# Patient Record
Sex: Male | Born: 1940
Health system: Southern US, Community
[De-identification: ages and names within clinical notes are randomized; demographics above are authoritative.]

## PROBLEM LIST (undated history)

## (undated) DIAGNOSIS — N390 Urinary tract infection, site not specified: Secondary | ICD-10-CM

## (undated) DIAGNOSIS — Z9289 Personal history of other medical treatment: Secondary | ICD-10-CM

## (undated) DIAGNOSIS — I5032 Chronic diastolic (congestive) heart failure: Secondary | ICD-10-CM

## (undated) DIAGNOSIS — E669 Obesity, unspecified: Secondary | ICD-10-CM

## (undated) DIAGNOSIS — I4510 Unspecified right bundle-branch block: Secondary | ICD-10-CM

## (undated) DIAGNOSIS — I700 Atherosclerosis of aorta: Secondary | ICD-10-CM

## (undated) DIAGNOSIS — J449 Chronic obstructive pulmonary disease, unspecified: Secondary | ICD-10-CM

## (undated) DIAGNOSIS — I340 Nonrheumatic mitral (valve) insufficiency: Secondary | ICD-10-CM

## (undated) DIAGNOSIS — J9601 Acute respiratory failure with hypoxia: Secondary | ICD-10-CM

## (undated) DIAGNOSIS — G4733 Obstructive sleep apnea (adult) (pediatric): Secondary | ICD-10-CM

## (undated) DIAGNOSIS — M4800 Spinal stenosis, site unspecified: Secondary | ICD-10-CM

## (undated) DIAGNOSIS — I10 Essential (primary) hypertension: Secondary | ICD-10-CM

## (undated) DIAGNOSIS — I2510 Atherosclerotic heart disease of native coronary artery without angina pectoris: Secondary | ICD-10-CM

## (undated) DIAGNOSIS — Z8719 Personal history of other diseases of the digestive system: Secondary | ICD-10-CM

## (undated) DIAGNOSIS — N400 Enlarged prostate without lower urinary tract symptoms: Secondary | ICD-10-CM

## (undated) DIAGNOSIS — R7302 Impaired glucose tolerance (oral): Secondary | ICD-10-CM

## (undated) DIAGNOSIS — E785 Hyperlipidemia, unspecified: Secondary | ICD-10-CM

## (undated) DIAGNOSIS — M1990 Unspecified osteoarthritis, unspecified site: Secondary | ICD-10-CM

## (undated) DIAGNOSIS — I5189 Other ill-defined heart diseases: Secondary | ICD-10-CM

## (undated) DIAGNOSIS — J302 Other seasonal allergic rhinitis: Secondary | ICD-10-CM

## (undated) HISTORY — DX: Personal history of other medical treatment: Z92.89

## (undated) HISTORY — PX: KNEE ARTHROSCOPY: SUR90

## (undated) HISTORY — DX: Atherosclerotic heart disease of native coronary artery without angina pectoris: I25.10

## (undated) HISTORY — DX: Urinary tract infection, site not specified: N39.0

## (undated) HISTORY — PX: JOINT REPLACEMENT: SHX530

## (undated) HISTORY — DX: Chronic diastolic (congestive) heart failure: I50.32

## (undated) HISTORY — DX: Atherosclerosis of aorta: I70.0

## (undated) HISTORY — DX: Benign prostatic hyperplasia without lower urinary tract symptoms: N40.0

## (undated) HISTORY — DX: Spinal stenosis, site unspecified: M48.00

## (undated) HISTORY — DX: Nonrheumatic mitral (valve) insufficiency: I34.0

---

## 2005-02-10 DIAGNOSIS — C4492 Squamous cell carcinoma of skin, unspecified: Secondary | ICD-10-CM

## 2005-02-10 HISTORY — DX: Squamous cell carcinoma of skin, unspecified: C44.92

## 2005-05-05 ENCOUNTER — Ambulatory Visit: Payer: Self-pay | Admitting: Gastroenterology

## 2005-05-06 LAB — HM COLONOSCOPY

## 2005-05-16 ENCOUNTER — Ambulatory Visit: Payer: Self-pay | Admitting: Gastroenterology

## 2006-12-05 HISTORY — PX: BACK SURGERY: SHX140

## 2008-01-09 ENCOUNTER — Encounter: Admission: RE | Admit: 2008-01-09 | Discharge: 2008-01-09 | Payer: Self-pay | Admitting: General Surgery

## 2008-02-05 ENCOUNTER — Encounter: Admission: RE | Admit: 2008-02-05 | Discharge: 2008-02-05 | Payer: Self-pay | Admitting: Orthopaedic Surgery

## 2008-02-18 ENCOUNTER — Encounter: Admission: RE | Admit: 2008-02-18 | Discharge: 2008-02-18 | Payer: Self-pay | Admitting: Orthopaedic Surgery

## 2008-03-03 ENCOUNTER — Encounter: Admission: RE | Admit: 2008-03-03 | Discharge: 2008-03-03 | Payer: Self-pay | Admitting: Orthopaedic Surgery

## 2008-03-14 ENCOUNTER — Ambulatory Visit (HOSPITAL_COMMUNITY): Admission: RE | Admit: 2008-03-14 | Discharge: 2008-03-15 | Payer: Self-pay | Admitting: Neurological Surgery

## 2008-04-16 ENCOUNTER — Encounter: Payer: Self-pay | Admitting: Neurological Surgery

## 2008-05-05 ENCOUNTER — Encounter: Payer: Self-pay | Admitting: Neurological Surgery

## 2009-02-17 ENCOUNTER — Encounter: Admission: RE | Admit: 2009-02-17 | Discharge: 2009-02-17 | Payer: Self-pay | Admitting: Orthopedic Surgery

## 2009-03-20 ENCOUNTER — Encounter: Admission: RE | Admit: 2009-03-20 | Discharge: 2009-03-20 | Payer: Self-pay | Admitting: Orthopedic Surgery

## 2009-03-21 ENCOUNTER — Encounter: Admission: RE | Admit: 2009-03-21 | Discharge: 2009-03-21 | Payer: Self-pay | Admitting: Neurological Surgery

## 2009-05-05 ENCOUNTER — Inpatient Hospital Stay (HOSPITAL_COMMUNITY): Admission: RE | Admit: 2009-05-05 | Discharge: 2009-05-08 | Payer: Self-pay | Admitting: Orthopedic Surgery

## 2010-04-22 ENCOUNTER — Encounter (INDEPENDENT_AMBULATORY_CARE_PROVIDER_SITE_OTHER): Payer: Self-pay | Admitting: *Deleted

## 2010-12-26 ENCOUNTER — Encounter: Payer: Self-pay | Admitting: Orthopedic Surgery

## 2011-01-05 NOTE — Letter (Signed)
Summary: Colonoscopy Date Change Letter  [facility]  [address] [place], [place] [address]   Phone: [phone]  Fax: [phone]      Apr 22, 2010 MRN: [id number]   Todd Gonzales [address] [place] [place], [place]  [address]   Dear [name],   Previously you were recommended to have a repeat colonoscopy around this time. Your chart was recently reviewed by Dr. Sheryn Bison of [facility]. Follow up colonoscopy is now recommended in June 2016. This revised recommendation is based on current, nationally recognized guidelines for colorectal cancer screening and polyp surveillance. These guidelines are endorsed by the American Cancer Society, [employer] on Colorectal Cancer as well as numerous other major medical organizations.  Please understand that our recommendation assumes that you do not have any new symptoms such as bleeding, a change in bowel habits, anemia, or significant abdominal discomfort. If you do have any concerning GI symptoms or want to discuss the guideline recommendations, please call to arrange an office visit at your earliest convenience. Otherwise we will keep you in our reminder system and contact you 1-2 months prior to the date listed above to schedule your next colonoscopy.  Thank you,   Vania Rea. Jarold Motto, M.D.  [facility] [phone]

## 2011-03-14 LAB — CBC
HCT: 36.3 % — ABNORMAL LOW (ref 39.0–52.0)
HCT: 38.4 % — ABNORMAL LOW (ref 39.0–52.0)
Hemoglobin: 12.4 g/dL — ABNORMAL LOW (ref 13.0–17.0)
Hemoglobin: 13.3 g/dL (ref 13.0–17.0)
MCHC: 34 g/dL (ref 30.0–36.0)
MCHC: 34.6 g/dL (ref 30.0–36.0)
MCV: 93 fL (ref 78.0–100.0)
MCV: 94.2 fL (ref 78.0–100.0)
Platelets: 173 10*3/uL (ref 150–400)
Platelets: 192 10*3/uL (ref 150–400)
RBC: 3.86 MIL/uL — ABNORMAL LOW (ref 4.22–5.81)
RBC: 4.13 MIL/uL — ABNORMAL LOW (ref 4.22–5.81)
RDW: 12.9 % (ref 11.5–15.5)
RDW: 13 % (ref 11.5–15.5)
WBC: 7.6 10*3/uL (ref 4.0–10.5)
WBC: 9.8 10*3/uL (ref 4.0–10.5)

## 2011-03-14 LAB — BASIC METABOLIC PANEL
BUN: 10 mg/dL (ref 6–23)
BUN: 7 mg/dL (ref 6–23)
CO2: 30 mEq/L (ref 19–32)
CO2: 31 mEq/L (ref 19–32)
Calcium: 7.8 mg/dL — ABNORMAL LOW (ref 8.4–10.5)
Calcium: 7.9 mg/dL — ABNORMAL LOW (ref 8.4–10.5)
Chloride: 100 mEq/L (ref 96–112)
Chloride: 103 mEq/L (ref 96–112)
Creatinine, Ser: 0.92 mg/dL (ref 0.4–1.5)
Creatinine, Ser: 0.94 mg/dL (ref 0.4–1.5)
GFR calc Af Amer: >60 mL/min (ref >60)
GFR calc Af Amer: >60 mL/min (ref >60)
GFR calc non Af Amer: >60 mL/min (ref >60)
GFR calc non Af Amer: >60 mL/min (ref >60)
Glucose, Bld: 121 mg/dL — ABNORMAL HIGH (ref 70–99)
Glucose, Bld: 124 mg/dL — ABNORMAL HIGH (ref 70–99)
Potassium: 3.5 mEq/L (ref 3.5–5.1)
Potassium: 3.7 mEq/L (ref 3.5–5.1)
Sodium: 135 mEq/L (ref 135–145)
Sodium: 136 mEq/L (ref 135–145)

## 2011-03-14 LAB — TYPE AND SCREEN
ABO/RH(D): O POS
Antibody Screen: NEGATIVE

## 2011-03-14 LAB — ABO/RH: ABO/RH(D): O POS

## 2011-03-15 LAB — URINALYSIS, ROUTINE W REFLEX MICROSCOPIC
Bilirubin Urine: NEGATIVE
Glucose, UA: NEGATIVE mg/dL
Hgb urine dipstick: NEGATIVE
Ketones, ur: NEGATIVE mg/dL
Nitrite: NEGATIVE
Protein, ur: NEGATIVE mg/dL
Specific Gravity, Urine: 1.025 (ref 1.005–1.030)
Urobilinogen, UA: 0.2 mg/dL (ref 0.0–1.0)
pH: 6 (ref 5.0–8.0)

## 2011-03-15 LAB — CBC
HCT: 45.8 % (ref 39.0–52.0)
Hemoglobin: 15.7 g/dL (ref 13.0–17.0)
MCHC: 34.2 g/dL (ref 30.0–36.0)
MCV: 94 fL (ref 78.0–100.0)
Platelets: 200 10*3/uL (ref 150–400)
RBC: 4.88 MIL/uL (ref 4.22–5.81)
RDW: 13.2 % (ref 11.5–15.5)
WBC: 7.3 10*3/uL (ref 4.0–10.5)

## 2011-03-15 LAB — BASIC METABOLIC PANEL
BUN: 17 mg/dL (ref 6–23)
CO2: 28 mEq/L (ref 19–32)
Calcium: 9.2 mg/dL (ref 8.4–10.5)
Chloride: 103 mEq/L (ref 96–112)
Creatinine, Ser: 0.97 mg/dL (ref 0.4–1.5)
GFR calc Af Amer: >60 mL/min (ref >60)
GFR calc non Af Amer: >60 mL/min (ref >60)
Glucose, Bld: 150 mg/dL — ABNORMAL HIGH (ref 70–99)
Potassium: 3.3 mEq/L — ABNORMAL LOW (ref 3.5–5.1)
Sodium: 139 mEq/L (ref 135–145)

## 2011-03-15 LAB — DIFFERENTIAL
Basophils Absolute: 0 10*3/uL (ref 0.0–0.1)
Basophils Relative: 1 % (ref 0–1)
Eosinophils Absolute: 0.1 10*3/uL (ref 0.0–0.7)
Eosinophils Relative: 2 % (ref 0–5)
Lymphocytes Relative: 21 % (ref 12–46)
Lymphs Abs: 1.5 10*3/uL (ref 0.7–4.0)
Monocytes Absolute: 0.5 10*3/uL (ref 0.1–1.0)
Monocytes Relative: 7 % (ref 3–12)
Neutro Abs: 5.1 10*3/uL (ref 1.7–7.7)
Neutrophils Relative %: 70 % (ref 43–77)

## 2011-03-15 LAB — APTT: aPTT: 26 seconds (ref 24–37)

## 2011-03-15 LAB — PROTIME-INR
INR: 1 (ref 0.00–1.49)
Prothrombin Time: 13.6 seconds (ref 11.6–15.2)

## 2011-04-19 NOTE — Op Note (Signed)
NAMEJORDON, [name]                 ACCOUNT NO.:  [id number]   MEDICAL RECORD NO.:  [id number]          PATIENT TYPE:  AMB   LOCATION:  SDS                          FACILITY:  [facility]   PHYSICIAN:  Stefani Dama, M.D.  DATE OF BIRTH:  [birth year]   DATE OF PROCEDURE:  03/13/2008  DATE OF DISCHARGE:                               OPERATIVE REPORT   PREOPERATIVE DIAGNOSIS:  1. Herniated nucleus pulposus, L1-L2 on the right with severe lumbar      spinal stenosis and lumbar radiculopathy.  2. Spondylosis and stenosis L2-L3 secondary to facet hypertrophy.   POSTOPERATIVE DIAGNOSIS:  1. Herniated nucleus pulposus, L1-L2 on the right with severe lumbar      spinal stenosis and lumbar radiculopathy.  2. Spondylosis and stenosis L2-L3 secondary to facet hypertrophy.   OPERATION:  Right L1-L2 laminotomy and resection of herniated nucleus  pulposus, L1-L2 with decompression of the L2 nerve root and the L1 nerve  root on the right, with the operating microscope, bilateral laminotomies  and foraminotomies, L2-L3 with decompression of the L3 nerve roots with  the operating microscope.   SURGEON:  Stefani Dama, M.D.   FIRST ASSISTANT:  Dr. Coletta Memos.   ANESTHESIA:  General endotracheal.   INDICATIONS:  Todd Gonzales is a 70 year old individual who has had  significant back pain and bilateral leg pain.  He had evidence of  spondylitic stenosis L2-L3.  He has a large extruded fragment of disk at  L-1 and L-2 on the right side.  He has been advised regarding surgical  decompression and it is now being performed.   PROCEDURE:  The patient was brought to the operating room supine on the  stretcher.  After smooth induction of general endotracheal anesthesia he  was turned prone.  Back was prepped with alcohol and DuraPrep and draped  in sterile fashion.  Midline incision was created and carried down to  lumbodorsal fascia.  Paraspinous musculature was separated from the  right side at the  L1-L2 level which was identified positively with  radiograph.  Laminotomy of L1 was undertaken removing up to the mesial  wall of the facet.  Partial facetectomy was completed and the yellow  ligament was taken up.  The underlying dura was noted be taut and bowed  dorsally and laterally.  By carefully dissecting under the operating  microscope, the dura in its attachment of the disk fragments were  identified at the L1-L2 level.  Significant amount of disk proceeded  downward along the L2 nerve root and this was resected thus freeing the  L2 nerve root.  The undersurface of the common dural tube was then freed  of a significant quantity of many fragments of markedly degenerated disk  material.  Cephalad the disk space was entered and diskectomy was  performed here.  In the end, the disk space was evacuated as best  possible and the common dural tube and the L2 nerve root inferiorly in  the L1 nerve root superiorly were well decompressed.  Attention was then  turned to L2-L3 where bilateral laminotomies were created with  high-  speed bur and 2.8-mm dissector tool.  The facets were markedly  hypertrophied at this level and partial facetectomy was required to  complete the procedure.  The yellow ligament was taken up and the common  dural tube was decompressed centrally and laterally out to the L3 nerve  root.  Once this area was verified to be well decompressed, hemostasis  was achieved.  1 mL fentanyl was left in the epidural space.  The  lumbodorsal fascia was closed with #1 Vicryl in interrupted fashion, 2-0  Vicryl was used in the  subcutaneous tissues and 3-0 Vicryl subcuticularly.  This skin was  infiltrated with 10 mL of 0.5% Marcaine with a total of 30 mL of  Marcaine being injected into this patient.  The patient tolerated  procedure well and was returned to recovery in stable condition.      Stefani Dama, M.D.  Electronically Signed     HJE/MEDQ  D:  03/13/2008  T:   03/14/2008  Job:  [id number]

## 2011-04-19 NOTE — H&P (Signed)
NAMENOLAND, PIZANO NO.:  [id number]   MEDICAL RECORD NO.:  [id number]          PATIENT TYPE:  INP   LOCATION:                               FACILITY:  [facility]   PHYSICIAN:  Madlyn Frankel. Charlann Boxer, M.D.  DATE OF BIRTH:  1941/03/30   DATE OF ADMISSION:  05/05/2009  DATE OF DISCHARGE:                              HISTORY & PHYSICAL   PROCEDURE:  Right total hip replacement.   ATTENDING PHYSICIAN:  Madlyn Frankel. Charlann Boxer, M.D.   CHIEF COMPLAINT:  Right hip pain.   HISTORY OF PRESENT ILLNESS:  A 70 year old male with a history of right  hip pain secondary to osteoarthritis.  This has been refractory to all  conservative treatment.  He did undergo a right hip interarticular  injection which relieved his symptoms of his right lower extremity down  to his knee.  However, this wore off after several weeks and the pain  returned.  Due to the nature of the pain he has been scheduled for a  right total hip replacement.   PRIMARY CARE PHYSICIAN:  [name], M.D.   OTHER SPECIALTY PHYSICIANS:  Stefani Dama, M.D. for his back and  Elana Alm. Thurston Hole, M.D. for his knee.   PAST MEDICAL HISTORY:  1. Osteoarthritis.  2. Hypertension.  3. Degenerative disk disease.   PAST SURGICAL HISTORY:  1. Back surgery by Dr. Danielle Dess due to herniated disk in April of 2009.  2. Right knee scoped in October of 2009 by Dr. Thurston Hole.   FAMILY HISTORY:  Cancer, arthritis.   SOCIAL HISTORY:  He is married.  Nonsmoker.  Primary caregiver will be  his wife in the home postoperatively.   DRUG ALLERGIES:  No known drug allergies.   MEDICATIONS:  1. Benazepril/HCT 20/50 one p.o. daily.  2. Celebrex 200 mg one p.o. daily.  3. Lyrica 75 mg p.o. daily is tapering down to 50 mg p.o. daily.  4. Tramadol 50 mg one p.o. b.i.d.  5. Protegra multivitamin daily.  6. Cialis p.r.n.   REVIEW OF SYSTEMS:  HEENT:  He wears dentures, has bridgework.  RESPIRATORY:  Last chest x-ray was April 2009.  CARDIOVASCULAR:  Last EKG was April 2009.  MUSCULOSKELETAL:  He has joint pain and morning stiffness.  Otherwise  see HPI.   PHYSICAL EXAMINATION:  VITAL SIGNS:  Pulse 72, respirations 16, blood  pressure 120/80.  GENERAL:  Awake, alert and oriented.  HEENT:  Normocephalic.  NECK:  Supple.  No carotid bruits.  CHEST:  Lungs clear to auscultation bilaterally.  BREASTS:  Deferred.  ABDOMEN:  Soft, nontender.  Bowel sounds present.  HEART:  S1, S2 distinct.  PELVIS:  Stable.  GU:  Deferred.  EXTREMITIES:  Right hip has increased pain with range of motion,  Trendelenburg and antalgic gait.  SKIN:  No cellulitis.  NEUROLOGICAL:  Intact distal sensibilities.   LABORATORY DATA:  Labs, EKG, chest x-ray pending.   IMPRESSION:  Right hip osteoarthritis.   PLAN:  Plan of action is a right total hip replacement by Dr. Charlann Boxer at  [facility] on May 05, 2009.  Risks and complications were  discussed.  Postoperative medication was provided today including  aspirin for DVT prophylaxis.     ______________________________  Yetta Glassman [address], [place]      Madlyn Frankel. Charlann Boxer, M.D.  Electronically Signed    BLM/MEDQ  D:  05/01/2009  T:  05/01/2009  Job:  [id number]   cc:   [name], M.D.  Fax: [phone]   Stefani Dama, M.D.  Fax: [phone]

## 2011-04-19 NOTE — Op Note (Signed)
Todd Gonzales, Todd Gonzales                 ACCOUNT NO.:  [id number]   MEDICAL RECORD NO.:  [id number]          PATIENT TYPE:  INP   LOCATION:  0001                         FACILITY:  [facility]   PHYSICIAN:  Madlyn Frankel. Charlann Boxer, M.D.  DATE OF BIRTH:  [birth year]   DATE OF PROCEDURE:  05/05/2009  DATE OF DISCHARGE:                               OPERATIVE REPORT   PREOPERATIVE DIAGNOSIS:  Right hip osteoarthritis.   POSTOPERATIVE DIAGNOSIS:  Right hip osteoarthritis.   PROCEDURE:  Right total hip replacement.   COMPONENTS USED:  DePuy hip system size 54 Pinnacle cup, 36 +4 neutral  marathon liner, 8 high Tri-Lock stem with 36/ 1.5 ball.   SURGEON:  Madlyn Frankel. Charlann Boxer, M.D.   ASSISTANT:  Dwyane Luo, PA-C   ANESTHESIA:  General.   BLOOD LOSS:  300 cc   SPECIMEN:  None.   PATHOLOGY:  None.   DRAINS:  One Hemovac.   COMPLICATIONS:  None.   INDICATIONS FOR PROCEDURE:  Todd Gonzales is a very pleasant 70 year old  gentleman referred for evaluation of the right hip after he had  persistent symptoms in his right hip after having lumbar decompressive  surgery with significant relief of his symptoms.  Risks and benefits of  hip replacement surgery were discussed as his advanced arthritis  prohibited any other conservative measures.  Specific risks of  infection, DVT, component failure, dislocation, need for revision  surgery were all discussed and reviewed.  Consent was obtained for hip  replacement for benefit of pain relief.   PROCEDURE IN DETAIL:  The patient was brought to operative theater.  Once adequate anesthesia, preoperative antibiotics, Ancef administered 2  grams, the patient was positioned into the left lateral decubitus  position with the right-side up. The right lower extremity was then  prescrubbed, prepped and draped in sterile fashion.   Time-out was performed identifying the patient, planned procedure and  extremity.  Lateral based incision was made for posterior approach to  the  hip.  Sharp dissection was carried down to the gluteal fascia.  The  patient was noted to have a very large gluteal musculature that  overlapped his trochanter.  The gluteal fascia was incised and the  gluteus split.  The short external rotators were identified and taken  down separate from the posterior capsule preserving the posterior  capsule for later anatomic repair but also to protect the sciatic nerve  from retractors.  Hip was dislocated.  Utilizing a trial broach and  head, a neck osteotomy was made.  This mimicked the anatomic landmarks  of preoperative templating.  Following this I prepared the femur with  the use of the box osteotome to remove some of the lateral femoral neck  with a starting drill, hand reamed it once and then irrigated the canal  to prevent fat emboli.  I began  broaching with a size 2 broach setting  anteversion at approximately  20 degrees, pretty close to his native  anatomy.  I broached up to a size 8 with good secure fit.  I  only had  to remove a little bit  of  off the anterior aspect of the calcar.   At this point I packed the femur into the acetabulum.  Acetabular  retractors were placed.  The labrum was excised sharply.  I began  reaming with 48 reamer and then went up to a 53 reamer with excellent  bony bed preparation.  The final 54 Pinnacle cup was then impacted with  approximately 35 degrees of abduction, 20 degrees of forward flexion  anatomically positioned within the columns with a portion of the cup  exposed superolaterally and it seemed to be parallel to the transverse  acetabular ligament.  I went ahead and  placed a trial liner.  There was  no need for any screws due to the excellent scratch fit.   The trial reduction now carried out with an 8 broach, initially standard  neck due to the fact that I used a 36 +4 liner.  There appeared to be a  little bit of  soft tissue impingement when I did the trial reduction  with internal rotation to  about 70 degrees.  Thus I changed over to the  high offset neck.  With this, there was no evidence of impingement with  external rotation and flexion and there was no evidence of any problems  in extension or hip flexion.  He tolerated internal rotation with  neutral abduction and approximately 70-80 degrees and tolerated the  sleep position.  There was only a about a millimeter shuck or less.  The  leg lengths appeared to be comparable to the down leg when I prepared  him in the pre- incision state.   At this point all trial components were removed.  A hole eliminator was  placed in the acetabulum.  The final 36+ 4 Marathon liner was impacted  with good cure visualized seating.  Final 8 Tri-Lock stem was then  opened and impacted at the level  where the broach sat.  Based on this  and my trial reduction, a  36/ 1.5 ball was opened and impacted onto the  clean and dry trunnion.  Hip was reduced and irrigated throughout the  case and again at this point.  I reapproximated posterior capsule and  superior leaflet using one Vicryl.  A medium Hemovac drain was placed  deep.  I reapproximated the gluteal fascia over top of the trochanter  using #1 Vicryl interrupted sutures.  The remainder of the wound was  closed with 2-0 Vicryl and running 4-0 Monocryl.  The hip was cleaned,  dried and  covered with Steri-Strips and a Mepilex dressing.  He was  brought to the recovery room in stable condition tolerating the  procedure well.      Madlyn Frankel Charlann Boxer, M.D.  Electronically Signed     MDO/MEDQ  D:  05/05/2009  T:  05/05/2009  Job:  [id number]

## 2011-04-22 NOTE — Discharge Summary (Signed)
NAMEARVINE, Todd Gonzales NO.:  [id number]   MEDICAL RECORD NO.:  [id number]          PATIENT TYPE:  INP   LOCATION:  1534                         FACILITY:  [facility]   PHYSICIAN:  Madlyn Frankel. Charlann Boxer, M.D.  DATE OF BIRTH:  08-Mar-1941   DATE OF ADMISSION:  05/05/2009  DATE OF DISCHARGE:  05/08/2009                               DISCHARGE SUMMARY   ADMITTING DIAGNOSES:  1. Osteoarthritis.  2. Hypertension.  3. Degenerative disk disease.   DISCHARGE DIAGNOSES:  1. Osteoarthritis.  2. Hypertension.  3. Degenerative disk disease.   HISTORY OF PRESENT ILLNESS:  A 70 year old male with a history of right  hip pain secondary to osteoarthritis.  It was refractory to all  conservative treatment.   CONSULTS:  None.   PROCEDURE:  A right total hip replacement by surgeon, Dr. [name].  Assistant, Dwyane Luo P.A.-C.   LABORATORY DATA:  CBC, final reading, white blood cell 9.8, hemoglobin  12.4, hematocrit 36.3, platelets 173.  Metabolic panel, sodium 136,  potassium 3.5, BUN 7, creatinine 0.94, and glucose was 121.   HOSPITAL COURSE:  Patient underwent a right total hip replacement and  admitted to orthopedic floor.  His stay was unremarkable.  He remained  hemodynamically and orthopedically stable.  He did have some pain issues  but these resolved prior to discharge.  Dressing was changed after day 2  with no significant drainage from the wound.  He had improving quad  function, improving ability to ambulate with physical therapy,  weightbearing as tolerated.  By day 3, he met all criteria for discharge  home.   DISCHARGE DISPOSITION:  Discharged home in stable and improved  condition.   DISCHARGE PHYSICAL THERAPY:  Weightbearing as tolerated.   DISCHARGE DIET:  Heart healthy.   DISCHARGE WOUND CARE:  Keep dry.   DISCHARGE MEDICATIONS:  1. Aspirin 325 mg one p.o. b.i.d. x6 weeks.  2. Robaxin 500 mg one p.o. q.6 muscle spasm pain.  3. Iron 325 mg one p.o. t.i.d.  x2 weeks.  4. Colace 100 mg one p.o. b.i.d.  5. MiraLax 17 g one p.o. daily.  6. Norco 7.5/325 one to two p.o. q.4 to 6 p.r.n. pain.  7. Celebrex 200 mg one p.o. daily.  8. Lyrica 75 mg one p.o. daily.  9. Multivitamin daily.  10.Benazepril/hydrochlorothiazide 20/25 mg one p.o. daily.   DISCHARGE FOLLOWUP:  Follow up with Dr. Charlann Boxer at phone number [phone] in  2 weeks for wound check.     ______________________________  Yetta Glassman. [address], [place]      Madlyn Frankel. Charlann Boxer, M.D.  Electronically Signed    BLM/MEDQ  D:  05/28/2009  T:  05/28/2009  Job:  [id number]   cc:   [name], M.D.  Fax: [phone]   Stefani Dama, M.D.  Fax: [phone]

## 2011-08-30 LAB — CBC
HCT: 47.6
Hemoglobin: 16.3
MCHC: 34.3
MCV: 94.6
Platelets: 243
RBC: 5.03
RDW: 12.9
WBC: 8.4

## 2011-08-30 LAB — BASIC METABOLIC PANEL
BUN: 14
CO2: 25
Calcium: 9.4
Chloride: 101
Creatinine, Ser: 0.83
GFR calc Af Amer: >60
GFR calc non Af Amer: >60
Glucose, Bld: 107 — ABNORMAL HIGH
Potassium: 3.6
Sodium: 140

## 2012-02-07 ENCOUNTER — Other Ambulatory Visit: Payer: Self-pay | Admitting: Orthopedic Surgery

## 2012-02-07 DIAGNOSIS — M1990 Unspecified osteoarthritis, unspecified site: Secondary | ICD-10-CM

## 2012-02-10 ENCOUNTER — Ambulatory Visit
Admission: RE | Admit: 2012-02-10 | Discharge: 2012-02-10 | Disposition: A | Discharge status: Home / Self Care | Payer: Medicare Other | Source: Ambulatory Visit | Attending: Orthopedic Surgery | Admitting: Orthopedic Surgery

## 2012-02-10 DIAGNOSIS — M1990 Unspecified osteoarthritis, unspecified site: Secondary | ICD-10-CM

## 2012-02-10 MED ORDER — IOHEXOL 180 MG/ML  SOLN
1.0000 mL | Freq: Once | INTRAMUSCULAR | Status: AC | PRN
  Administered 2012-02-10: 1 mL via INTRA_ARTICULAR

## 2012-03-05 ENCOUNTER — Encounter (HOSPITAL_COMMUNITY): Payer: Self-pay | Admitting: Pharmacy Technician

## 2012-03-14 ENCOUNTER — Other Ambulatory Visit: Payer: Self-pay

## 2012-03-14 ENCOUNTER — Ambulatory Visit (HOSPITAL_COMMUNITY)
Admission: RE | Admit: 2012-03-14 | Discharge: 2012-03-14 | Disposition: A | Discharge status: Home / Self Care | Payer: Medicare Other | Source: Ambulatory Visit | Attending: Orthopedic Surgery | Admitting: Orthopedic Surgery

## 2012-03-14 ENCOUNTER — Encounter (HOSPITAL_COMMUNITY)
Admission: RE | Admit: 2012-03-14 | Discharge: 2012-03-14 | Disposition: A | Discharge status: Home / Self Care | Payer: Medicare Other | Source: Ambulatory Visit | Attending: Orthopedic Surgery | Admitting: Orthopedic Surgery

## 2012-03-14 ENCOUNTER — Encounter (HOSPITAL_COMMUNITY): Payer: Self-pay

## 2012-03-14 DIAGNOSIS — Z01818 Encounter for other preprocedural examination: Secondary | ICD-10-CM | POA: Insufficient documentation

## 2012-03-14 DIAGNOSIS — Z01810 Encounter for preprocedural cardiovascular examination: Secondary | ICD-10-CM | POA: Insufficient documentation

## 2012-03-14 DIAGNOSIS — M169 Osteoarthritis of hip, unspecified: Secondary | ICD-10-CM | POA: Insufficient documentation

## 2012-03-14 DIAGNOSIS — M1610 Unilateral primary osteoarthritis, unspecified hip: Secondary | ICD-10-CM | POA: Insufficient documentation

## 2012-03-14 DIAGNOSIS — K449 Diaphragmatic hernia without obstruction or gangrene: Secondary | ICD-10-CM | POA: Insufficient documentation

## 2012-03-14 DIAGNOSIS — Z01812 Encounter for preprocedural laboratory examination: Secondary | ICD-10-CM | POA: Insufficient documentation

## 2012-03-14 HISTORY — DX: Essential (primary) hypertension: I10

## 2012-03-14 HISTORY — DX: Hyperlipidemia, unspecified: E78.5

## 2012-03-14 HISTORY — DX: Personal history of other diseases of the digestive system: Z87.19

## 2012-03-14 HISTORY — DX: Unspecified osteoarthritis, unspecified site: M19.90

## 2012-03-14 HISTORY — DX: Impaired glucose tolerance (oral): R73.02

## 2012-03-14 LAB — DIFFERENTIAL
Basophils Absolute: 0 10*3/uL (ref 0.0–0.1)
Basophils Relative: 0 % (ref 0–1)
Eosinophils Absolute: 0.1 10*3/uL (ref 0.0–0.7)
Eosinophils Relative: 1 % (ref 0–5)
Lymphocytes Relative: 20 % (ref 12–46)
Lymphs Abs: 1.3 10*3/uL (ref 0.7–4.0)
Monocytes Absolute: 0.6 10*3/uL (ref 0.1–1.0)
Monocytes Relative: 9 % (ref 3–12)
Neutro Abs: 4.8 10*3/uL (ref 1.7–7.7)
Neutrophils Relative %: 70 % (ref 43–77)

## 2012-03-14 LAB — APTT: aPTT: 29 seconds (ref 24–37)

## 2012-03-14 LAB — BASIC METABOLIC PANEL
BUN: 21 mg/dL (ref 6–23)
CO2: 30 mEq/L (ref 19–32)
Calcium: 8.7 mg/dL (ref 8.4–10.5)
Chloride: 102 mEq/L (ref 96–112)
Creatinine, Ser: 1.13 mg/dL (ref 0.50–1.35)
GFR calc Af Amer: 73 mL/min — ABNORMAL LOW (ref >90)
GFR calc non Af Amer: 63 mL/min — ABNORMAL LOW (ref >90)
Glucose, Bld: 109 mg/dL — ABNORMAL HIGH (ref 70–99)
Potassium: 3.6 mEq/L (ref 3.5–5.1)
Sodium: 139 mEq/L (ref 135–145)

## 2012-03-14 LAB — URINALYSIS, ROUTINE W REFLEX MICROSCOPIC
Bilirubin Urine: NEGATIVE
Glucose, UA: NEGATIVE mg/dL
Hgb urine dipstick: NEGATIVE
Leukocytes, UA: NEGATIVE
Nitrite: NEGATIVE
Protein, ur: NEGATIVE mg/dL
Specific Gravity, Urine: 1.024 (ref 1.005–1.030)
Urobilinogen, UA: 1 mg/dL (ref 0.0–1.0)
pH: 6 (ref 5.0–8.0)

## 2012-03-14 LAB — SURGICAL PCR SCREEN
MRSA, PCR: NEGATIVE
Staphylococcus aureus: NEGATIVE

## 2012-03-14 LAB — CBC
HCT: 45.2 % (ref 39.0–52.0)
Hemoglobin: 15.5 g/dL (ref 13.0–17.0)
MCH: 32.2 pg (ref 26.0–34.0)
MCHC: 34.3 g/dL (ref 30.0–36.0)
MCV: 94 fL (ref 78.0–100.0)
Platelets: 201 10*3/uL (ref 150–400)
RBC: 4.81 MIL/uL (ref 4.22–5.81)
RDW: 12.7 % (ref 11.5–15.5)
WBC: 6.9 10*3/uL (ref 4.0–10.5)

## 2012-03-14 LAB — PROTIME-INR
INR: 1.05 (ref 0.00–1.49)
Prothrombin Time: 13.9 seconds (ref 11.6–15.2)

## 2012-03-14 MED ORDER — CHLORHEXIDINE GLUCONATE 4 % EX LIQD
60.0000 mL | Freq: Once | CUTANEOUS | Status: DC
  Filled 2012-03-14: qty 60

## 2012-03-14 NOTE — Progress Notes (Signed)
H&P dictated 03/14/12 Dictation # [id number]

## 2012-03-14 NOTE — Patient Instructions (Signed)
20 REIS GOGA  03/14/2012   Your procedure is scheduled on:  03/20/12   Tuesday  Surgery  1200-1330  Report to [facility] at 0930      AM.  Call this number if you have problems the morning of surgery: [phone]     Or PST   [id number]  [facility]   Remember:   Do not eat food:After Midnight.MONDAY NIGHT  May have clear liquids :UNTIL  MIDNIGHT Monday NIGHT   Clear liquids include soda, tea, black coffee, apple or grape juice, broth.  Take these medicines the morning of surgery with A SIP OF WATER :Tramadol with sip water if needed   Do not wear jewelry, make-up or nail polish.  Do not wear lotions, powders, or perfumes. You may wear deodorant.  Do not shave 48 hours prior to surgery.  Do not bring valuables to the hospital.  Contacts, dentures or bridgework may not be worn into surgery.  Leave suitcase in the car. After surgery it may be brought to your room.  For patients admitted to the hospital, checkout time is 11:00 AM the day of discharge.   Patients discharged the day of surgery will not be allowed to drive home.  Name and phone number of your driver:NANCY   wife                                                                      Special Instructions: CHG Shower Use Special Wash: 1/2 bottle night before surgery and 1/2 bottle morning of surgery. REGULAR SOAP FACE AND PRIVATES                             MEN-MAY SHAVE FACE MORNING OF SURGERY  Please read over the following fact sheets that you were given: MRSA Information

## 2012-03-15 NOTE — H&P (Signed)
NAMEARMIN, Todd Gonzales NO.:  [id number]  MEDICAL RECORD NO.:  [id number]  LOCATION:                               FACILITY:  [facility]  PHYSICIAN:  Madlyn Frankel. Charlann Boxer, M.D.  DATE OF BIRTH:  May 22, 1941  DATE OF ADMISSION:  03/20/2012 DATE OF DISCHARGE:                             HISTORY & PHYSICAL   DATE OF SURGERY:  March 20, 2012.  ADMITTING DIAGNOSIS:  End-stage osteoarthritis, left hip.  HISTORY OF PRESENT ILLNESS:  This is a 71 year old gentleman with a history of osteoarthritis of his left hip who has failed conservative treatment.  At this time, the patient is here to have a total hip arthroplasty of the left hip by anterior approach.  The surgery's benefits and aftercare were discussed in detail with the patient, questions invited and answered.  The patient is a candidate for tranexamic acid, but is not a candidate for dexamethasone.  He is planning on going home after surgery.  He is given his home medications of aspirin, iron, MiraLAX, Colace and Robaxin.  Note that his medical doctor is Dr. Wylene Simmer.  PAST MEDICAL HISTORY:  Medical illnesses include hypertension, osteoarthritis and glucose intolerance, diet controlled.  PAST SURGICAL HISTORY:  Include a right hip total hip arthroplasty, laminectomy of LS spine and knee arthroscopy.  ALLERGIES:  Drug allergies to Codeine with nausea and vomiting.  CURRENT MEDICATIONS:  Benazepril/hydrochlorothiazide 20/12.5 mg 1 daily, Celebrex 200 mg daily.  FAMILY HISTORY:  Positive for emphysema and cancer.  SOCIAL HISTORY:  Patient is married.  He is retired.  He does not smoke. He drinks socially.  Again plans on going home after surgery.  REVIEW OF SYSTEMS:  CENTRAL NERVOUS SYSTEM:  Negative for headache, blurred vision, or dizziness.  PULMONARY:  Negative shortness of breath, PND, and orthopnea.  CARDIOVASCULAR:  Negative for chest pain or palpitation.  GI:  Negative for ulcers or hepatitis.  GU:  Negative  for urinary tract difficulty.  MUSCULOSKELETAL:  Positive as in HPI.  PHYSICAL EXAMINATION:  GENERAL:  This is a well-developed, well- nourished gentleman, in no acute distress. VITAL SIGNS:  Blood pressure 135/74, pulse 74 and regular, respirations 14. HEENT:  Head normocephalic.  Nose patent.  Ears patent.  Pupils equal, round, reactive to light.  Throat without injection. NECK:  Supple without adenopathy.  Carotids 2+ without bruits. CHEST:  Clear to auscultation.  No rales or rhonchi.  Respirations 14. HEART:  Regular rate and rhythm at 74 beats per minute without murmur. ABDOMEN:  Soft.  Active bowel sounds.  No masses or organomegaly. NEUROLOGIC:  Alert, oriented to time, place, person.  Cranial nerves 2- 12 grossly intact. EXTREMITIES:  Shows the left hip with full extension, flexion to 105 degrees, external rotation of 25 degrees, internal rotation of 5 degrees.  NEUROVASCULAR:  Status intact.  Dorsalis pedis, posterior tibialis pulses 2+.  ASSESSMENT:  Osteoarthritis, left hip.  PLAN:  Total hip arthroplasty by anterior approach, left hip.     Jaquelyn Bitter. Berma Harts, P.A.   ______________________________ Madlyn Frankel Charlann Boxer, M.D.    SJC/MEDQ  D:  03/14/2012  T:  03/15/2012  Job:  [id number]

## 2012-03-19 NOTE — Pre-Procedure Instructions (Signed)
Notified wife of surgery change time- to be in short stay at  1030 am 03/20/12

## 2012-03-20 ENCOUNTER — Ambulatory Visit (HOSPITAL_COMMUNITY): Payer: Medicare Other | Admitting: *Deleted

## 2012-03-20 ENCOUNTER — Encounter (HOSPITAL_COMMUNITY): Payer: Self-pay | Admitting: *Deleted

## 2012-03-20 ENCOUNTER — Encounter (HOSPITAL_COMMUNITY): Payer: Self-pay

## 2012-03-20 ENCOUNTER — Inpatient Hospital Stay (HOSPITAL_COMMUNITY)
Admission: RE | Admit: 2012-03-20 | Discharge: 2012-03-22 | DRG: 470 | Disposition: A | Discharge status: Home Health | Payer: Medicare Other | Source: Ambulatory Visit | Attending: Orthopedic Surgery | Admitting: Orthopedic Surgery

## 2012-03-20 ENCOUNTER — Ambulatory Visit (HOSPITAL_COMMUNITY): Payer: Medicare Other

## 2012-03-20 ENCOUNTER — Encounter (HOSPITAL_COMMUNITY)
Admission: RE | Disposition: A | Discharge status: Home Health | Payer: Self-pay | Source: Ambulatory Visit | Attending: Orthopedic Surgery

## 2012-03-20 DIAGNOSIS — Z96649 Presence of unspecified artificial hip joint: Secondary | ICD-10-CM

## 2012-03-20 DIAGNOSIS — I10 Essential (primary) hypertension: Secondary | ICD-10-CM | POA: Diagnosis present

## 2012-03-20 DIAGNOSIS — M1610 Unilateral primary osteoarthritis, unspecified hip: Principal | ICD-10-CM | POA: Diagnosis present

## 2012-03-20 DIAGNOSIS — M169 Osteoarthritis of hip, unspecified: Principal | ICD-10-CM | POA: Diagnosis present

## 2012-03-20 HISTORY — PX: TOTAL HIP ARTHROPLASTY: SHX124

## 2012-03-20 LAB — TYPE AND SCREEN
ABO/RH(D): O POS
Antibody Screen: NEGATIVE

## 2012-03-20 SURGERY — ARTHROPLASTY, HIP, TOTAL, ANTERIOR APPROACH
Anesthesia: General | Site: Hip | Laterality: Left | Wound class: Clean

## 2012-03-20 MED ORDER — LACTATED RINGERS IV SOLN
INTRAVENOUS | Status: DC
  Administered 2012-03-20: 1000 mL via INTRAVENOUS

## 2012-03-20 MED ORDER — CEFAZOLIN SODIUM-DEXTROSE 2-3 GM-% IV SOLR
2.0000 g | Freq: Once | INTRAVENOUS | Status: AC
  Administered 2012-03-20: 2 g via INTRAVENOUS

## 2012-03-20 MED ORDER — BISACODYL 5 MG PO TBEC: 5.0000 mg | DELAYED_RELEASE_TABLET | Freq: Every day | ORAL | Status: DC | PRN

## 2012-03-20 MED ORDER — HYDROMORPHONE HCL PF 1 MG/ML IJ SOLN
INTRAMUSCULAR | Status: AC
  Filled 2012-03-20: qty 1

## 2012-03-20 MED ORDER — LIDOCAINE HCL (CARDIAC) 20 MG/ML IV SOLN
INTRAVENOUS | Status: DC | PRN
  Administered 2012-03-20: 50 mg via INTRAVENOUS

## 2012-03-20 MED ORDER — METOCLOPRAMIDE HCL 5 MG/ML IJ SOLN: 5.0000 mg | Freq: Three times a day (TID) | INTRAMUSCULAR | Status: DC | PRN

## 2012-03-20 MED ORDER — HYDROMORPHONE HCL PF 1 MG/ML IJ SOLN
0.2500 mg | INTRAMUSCULAR | Status: DC | PRN
  Administered 2012-03-20 (×4): 0.5 mg via INTRAVENOUS

## 2012-03-20 MED ORDER — PROPOFOL 10 MG/ML IV BOLUS
INTRAVENOUS | Status: DC | PRN
  Administered 2012-03-20: 160 mg via INTRAVENOUS

## 2012-03-20 MED ORDER — METOCLOPRAMIDE HCL 10 MG PO TABS: 5.0000 mg | ORAL_TABLET | Freq: Three times a day (TID) | ORAL | Status: DC | PRN

## 2012-03-20 MED ORDER — ACETAMINOPHEN 10 MG/ML IV SOLN
INTRAVENOUS | Status: DC | PRN
  Administered 2012-03-20: 1000 mg via INTRAVENOUS

## 2012-03-20 MED ORDER — FERROUS SULFATE 325 (65 FE) MG PO TABS
325.0000 mg | ORAL_TABLET | Freq: Three times a day (TID) | ORAL | Status: DC
  Administered 2012-03-21 – 2012-03-22 (×4): 325 mg via ORAL
  Filled 2012-03-20 (×7): qty 1

## 2012-03-20 MED ORDER — 0.9 % SODIUM CHLORIDE (POUR BTL) OPTIME
TOPICAL | Status: DC | PRN
  Administered 2012-03-20: 1000 mL

## 2012-03-20 MED ORDER — ROCURONIUM BROMIDE 100 MG/10ML IV SOLN
INTRAVENOUS | Status: DC | PRN
  Administered 2012-03-20: 50 mg via INTRAVENOUS
  Administered 2012-03-20: 20 mg via INTRAVENOUS

## 2012-03-20 MED ORDER — RIVAROXABAN 10 MG PO TABS
10.0000 mg | ORAL_TABLET | ORAL | Status: DC
  Administered 2012-03-21 – 2012-03-22 (×2): 10 mg via ORAL
  Filled 2012-03-20 (×2): qty 1

## 2012-03-20 MED ORDER — CEFAZOLIN SODIUM-DEXTROSE 2-3 GM-% IV SOLR
2.0000 g | Freq: Four times a day (QID) | INTRAVENOUS | Status: AC
  Administered 2012-03-20 – 2012-03-21 (×3): 2 g via INTRAVENOUS
  Filled 2012-03-20 (×3): qty 50

## 2012-03-20 MED ORDER — ZOLPIDEM TARTRATE 5 MG PO TABS: 5.0000 mg | ORAL_TABLET | Freq: Every evening | ORAL | Status: DC | PRN

## 2012-03-20 MED ORDER — DOCUSATE SODIUM 100 MG PO CAPS
100.0000 mg | ORAL_CAPSULE | Freq: Two times a day (BID) | ORAL | Status: DC
  Administered 2012-03-20 – 2012-03-22 (×4): 100 mg via ORAL
  Filled 2012-03-20 (×5): qty 1

## 2012-03-20 MED ORDER — HYDROCHLOROTHIAZIDE 12.5 MG PO CAPS
12.5000 mg | ORAL_CAPSULE | Freq: Every day | ORAL | Status: DC
  Administered 2012-03-21 – 2012-03-22 (×2): 12.5 mg via ORAL
  Filled 2012-03-20 (×2): qty 1

## 2012-03-20 MED ORDER — HYDROMORPHONE HCL PF 1 MG/ML IJ SOLN
INTRAMUSCULAR | Status: DC | PRN
  Administered 2012-03-20 (×2): 1 mg via INTRAVENOUS

## 2012-03-20 MED ORDER — ALUM & MAG HYDROXIDE-SIMETH 200-200-20 MG/5ML PO SUSP: 30.0000 mL | ORAL | Status: DC | PRN

## 2012-03-20 MED ORDER — TRANEXAMIC ACID 100 MG/ML IV SOLN
1610.0000 mg | Freq: Once | INTRAVENOUS | Status: AC
  Administered 2012-03-20: 1612.5 mg via INTRAVENOUS
  Filled 2012-03-20: qty 16.1

## 2012-03-20 MED ORDER — CELECOXIB 200 MG PO CAPS
200.0000 mg | ORAL_CAPSULE | Freq: Every day | ORAL | Status: DC
  Administered 2012-03-20 – 2012-03-22 (×3): 200 mg via ORAL
  Filled 2012-03-20 (×3): qty 1

## 2012-03-20 MED ORDER — NEOSTIGMINE METHYLSULFATE 1 MG/ML IJ SOLN
INTRAMUSCULAR | Status: DC | PRN
  Administered 2012-03-20: 5 mg via INTRAVENOUS

## 2012-03-20 MED ORDER — DIPHENHYDRAMINE HCL 25 MG PO CAPS: 25.0000 mg | ORAL_CAPSULE | Freq: Four times a day (QID) | ORAL | Status: DC | PRN

## 2012-03-20 MED ORDER — ONDANSETRON HCL 4 MG/2ML IJ SOLN: 4.0000 mg | Freq: Four times a day (QID) | INTRAMUSCULAR | Status: DC | PRN

## 2012-03-20 MED ORDER — ACETAMINOPHEN 10 MG/ML IV SOLN
INTRAVENOUS | Status: AC
  Filled 2012-03-20: qty 100

## 2012-03-20 MED ORDER — SODIUM CHLORIDE 0.9 % IV SOLN
100.0000 mL/h | INTRAVENOUS | Status: DC
  Administered 2012-03-20 – 2012-03-21 (×2): 100 mL/h via INTRAVENOUS
  Filled 2012-03-20 (×11): qty 1000

## 2012-03-20 MED ORDER — PHENOL 1.4 % MT LIQD
1.0000 | OROMUCOSAL | Status: DC | PRN
  Filled 2012-03-20: qty 177

## 2012-03-20 MED ORDER — GLYCOPYRROLATE 0.2 MG/ML IJ SOLN
INTRAMUSCULAR | Status: DC | PRN
  Administered 2012-03-20: .8 mg via INTRAVENOUS

## 2012-03-20 MED ORDER — OXYMETAZOLINE HCL 0.05 % NA SOLN
2.0000 | Freq: Two times a day (BID) | NASAL | Status: DC | PRN
  Filled 2012-03-20: qty 15

## 2012-03-20 MED ORDER — METHOCARBAMOL 500 MG PO TABS
500.0000 mg | ORAL_TABLET | Freq: Four times a day (QID) | ORAL | Status: DC | PRN
  Administered 2012-03-22: 500 mg via ORAL
  Filled 2012-03-20: qty 1

## 2012-03-20 MED ORDER — MENTHOL 3 MG MT LOZG
1.0000 | LOZENGE | OROMUCOSAL | Status: DC | PRN
  Filled 2012-03-20: qty 9

## 2012-03-20 MED ORDER — HYDROCODONE-ACETAMINOPHEN 7.5-325 MG PO TABS
1.0000 | ORAL_TABLET | ORAL | Status: DC
  Administered 2012-03-20 – 2012-03-22 (×8): 2 via ORAL
  Filled 2012-03-20 (×2): qty 2
  Filled 2012-03-20: qty 1
  Filled 2012-03-20 (×7): qty 2

## 2012-03-20 MED ORDER — LACTATED RINGERS IV SOLN: INTRAVENOUS | Status: DC

## 2012-03-20 MED ORDER — POLYETHYLENE GLYCOL 3350 17 G PO PACK
17.0000 g | PACK | Freq: Two times a day (BID) | ORAL | Status: DC
  Administered 2012-03-20 – 2012-03-22 (×4): 17 g via ORAL
  Filled 2012-03-20 (×5): qty 1

## 2012-03-20 MED ORDER — CEFAZOLIN SODIUM-DEXTROSE 2-3 GM-% IV SOLR
INTRAVENOUS | Status: AC
  Filled 2012-03-20: qty 50

## 2012-03-20 MED ORDER — TRANEXAMIC ACID 100 MG/ML IV SOLN: 1.5000 mg/kg/h | INTRAVENOUS | Status: DC

## 2012-03-20 MED ORDER — PHENYLEPHRINE HCL 10 MG/ML IJ SOLN
INTRAMUSCULAR | Status: DC | PRN
  Administered 2012-03-20: 40 ug via INTRAVENOUS

## 2012-03-20 MED ORDER — DEXAMETHASONE SODIUM PHOSPHATE 10 MG/ML IJ SOLN
10.0000 mg | Freq: Once | INTRAMUSCULAR | Status: AC
  Administered 2012-03-21: 10 mg via INTRAVENOUS
  Filled 2012-03-20: qty 1

## 2012-03-20 MED ORDER — BENAZEPRIL-HYDROCHLOROTHIAZIDE 20-12.5 MG PO TABS: 1.0000 | ORAL_TABLET | Freq: Every day | ORAL | Status: DC

## 2012-03-20 MED ORDER — FLEET ENEMA 7-19 GM/118ML RE ENEM: 1.0000 | ENEMA | Freq: Once | RECTAL | Status: AC | PRN

## 2012-03-20 MED ORDER — METHOCARBAMOL 100 MG/ML IJ SOLN
500.0000 mg | Freq: Four times a day (QID) | INTRAVENOUS | Status: DC | PRN
  Administered 2012-03-20: 500 mg via INTRAVENOUS
  Filled 2012-03-20 (×2): qty 5

## 2012-03-20 MED ORDER — BENAZEPRIL HCL 20 MG PO TABS
20.0000 mg | ORAL_TABLET | Freq: Every day | ORAL | Status: DC
  Administered 2012-03-21 – 2012-03-22 (×2): 20 mg via ORAL
  Filled 2012-03-20 (×2): qty 1

## 2012-03-20 MED ORDER — LACTATED RINGERS IV SOLN
INTRAVENOUS | Status: DC | PRN
  Administered 2012-03-20: 14:00:00 via INTRAVENOUS

## 2012-03-20 MED ORDER — HYDROMORPHONE HCL PF 1 MG/ML IJ SOLN: 0.5000 mg | INTRAMUSCULAR | Status: DC | PRN

## 2012-03-20 MED ORDER — FENTANYL CITRATE 0.05 MG/ML IJ SOLN
INTRAMUSCULAR | Status: DC | PRN
  Administered 2012-03-20 (×2): 50 ug via INTRAVENOUS
  Administered 2012-03-20: 100 ug via INTRAVENOUS
  Administered 2012-03-20 (×2): 50 ug via INTRAVENOUS
  Administered 2012-03-20 (×2): 100 ug via INTRAVENOUS

## 2012-03-20 MED ORDER — ONDANSETRON HCL 4 MG PO TABS: 4.0000 mg | ORAL_TABLET | Freq: Four times a day (QID) | ORAL | Status: DC | PRN

## 2012-03-20 SURGICAL SUPPLY — 41 items
ADH SKN CLS APL DERMABOND .7 (GAUZE/BANDAGES/DRESSINGS) ×1
BAG SPEC THK2 15X12 ZIP CLS (MISCELLANEOUS) ×2
BAG ZIPLOCK 12X15 (MISCELLANEOUS) ×4 IMPLANT
BLADE SAW SGTL 18X1.27X75 (BLADE) ×2 IMPLANT
CELLS DAT CNTRL 66122 CELL SVR (MISCELLANEOUS) ×1 IMPLANT
CLOTH BEACON ORANGE TIMEOUT ST (SAFETY) ×2 IMPLANT
DERMABOND ADVANCED (GAUZE/BANDAGES/DRESSINGS) ×1
DERMABOND ADVANCED .7 DNX12 (GAUZE/BANDAGES/DRESSINGS) ×1 IMPLANT
DRAPE C-ARM 42X72 X-RAY (DRAPES) ×2 IMPLANT
DRAPE STERI IOBAN 125X83 (DRAPES) ×2 IMPLANT
DRAPE U-SHAPE 47X51 STRL (DRAPES) ×6 IMPLANT
DRSG AQUACEL AG ADV 3.5X10 (GAUZE/BANDAGES/DRESSINGS) ×2 IMPLANT
DRSG TEGADERM 4X4.75 (GAUZE/BANDAGES/DRESSINGS) ×1 IMPLANT
DURAPREP 26ML APPLICATOR (WOUND CARE) ×2 IMPLANT
ELECT BLADE TIP CTD 4 INCH (ELECTRODE) ×2 IMPLANT
ELECT REM PT RETURN 9FT ADLT (ELECTROSURGICAL) ×2
ELECTRODE REM PT RTRN 9FT ADLT (ELECTROSURGICAL) ×1 IMPLANT
EVACUATOR 1/8 PVC DRAIN (DRAIN) IMPLANT
FACESHIELD LNG OPTICON STERILE (SAFETY) ×8 IMPLANT
GAUZE SPONGE 2X2 8PLY STRL LF (GAUZE/BANDAGES/DRESSINGS) ×1 IMPLANT
GLOVE BIOGEL PI IND STRL 7.5 (GLOVE) ×1 IMPLANT
GLOVE BIOGEL PI IND STRL 8 (GLOVE) ×1 IMPLANT
GLOVE BIOGEL PI INDICATOR 7.5 (GLOVE) ×1
GLOVE BIOGEL PI INDICATOR 8 (GLOVE) ×1
GLOVE ECLIPSE 8.0 STRL XLNG CF (GLOVE) ×2 IMPLANT
GLOVE ORTHO TXT STRL SZ7.5 (GLOVE) ×4 IMPLANT
GOWN BRE IMP PREV XXLGXLNG (GOWN DISPOSABLE) ×4 IMPLANT
GOWN STRL NON-REIN LRG LVL3 (GOWN DISPOSABLE) ×2 IMPLANT
KIT BASIN OR (CUSTOM PROCEDURE TRAY) ×2 IMPLANT
PACK TOTAL JOINT (CUSTOM PROCEDURE TRAY) ×2 IMPLANT
PADDING CAST COTTON 6X4 STRL (CAST SUPPLIES) ×2 IMPLANT
RETRACTOR WND ALEXIS 18 MED (MISCELLANEOUS) ×1 IMPLANT
RTRCTR WOUND ALEXIS 18CM MED (MISCELLANEOUS) ×2
SPONGE GAUZE 2X2 STER 10/PKG (GAUZE/BANDAGES/DRESSINGS) ×1
SUCTION FRAZIER 12FR DISP (SUCTIONS) ×2 IMPLANT
SUT MNCRL AB 4-0 PS2 18 (SUTURE) ×2 IMPLANT
SUT VIC AB 1 CT1 36 (SUTURE) ×8 IMPLANT
SUT VIC AB 2-0 CT1 27 (SUTURE) ×4
SUT VIC AB 2-0 CT1 TAPERPNT 27 (SUTURE) ×2 IMPLANT
TOWEL OR 17X26 10 PK STRL BLUE (TOWEL DISPOSABLE) ×4 IMPLANT
TRAY FOLEY CATH 14FRSI W/METER (CATHETERS) ×2 IMPLANT

## 2012-03-20 NOTE — Op Note (Signed)
NAME:  Todd Gonzales                ACCOUNT NO.: [id number]      MEDICAL RECORD NO.: [id number]      FACILITY:  [facility]      PHYSICIAN:  [name] D  DATE OF BIRTH:  1940-12-27     DATE OF PROCEDURE:  03/20/2012                                 OPERATIVE REPORT         PREOPERATIVE DIAGNOSIS: Left  hip osteoarthritis.      POSTOPERATIVE DIAGNOSIS:  Left hip osteoarthritis.      PROCEDURE:  Left total hip replacement through an anterior approach   utilizing DePuy THR system, component size 54 pinnacle cup, a size 36+4 neutral   Altrex liner, a size 7 Hi Tri Lock stem with a 36+1.5 delta ceramic   ball.      SURGEON:  Madlyn Frankel. Charlann Boxer, M.D.      ASSISTANT:  Lanney Gins, PA      ANESTHESIA:  General.      SPECIMENS:  None.      COMPLICATIONS:  None.      BLOOD LOSS:  250 cc     DRAINS:  One Hemovac.      INDICATION OF THE PROCEDURE:  Todd Gonzales is a 71 y.o. male who had   presented to office for evaluation of left hip pain.  Radiographs revealed   progressive degenerative changes with bone-on-bone   articulation to the  hip joint.  The patient had painful limited range of   motion significantly affecting their overall quality of life.  The patient was failing to    respond to conservative measures, and at this point was ready   to proceed with more definitive measures.  The patient has noted progressive   degenerative changes in his hip, progressive problems and dysfunction   with regarding the hip prior to surgery.  Consent was obtained for   benefit of pain relief.  Specific risk of infection, DVT, component   failure, dislocation, need for revision surgery, as well discussion of   the anterior versus posterior approach were reviewed.  Consent was   obtained for benefit of anterior pain relief through an anterior   approach.      PROCEDURE IN DETAIL:  The patient was brought to operative theater.   Once adequate anesthesia, preoperative antibiotics, 2gm Ancef  administered.   The patient was positioned supine on the OSI Hanna table.  Once adequate   padding of boney process was carried out, we had predraped out the hip, and  used fluoroscopy to confirm orientation of the pelvis and position.      The left hip was then prepped and draped from proximal iliac crest to   mid thigh with shower curtain technique.      Time-out was performed identifying the patient, planned procedure, and   extremity.     An incision was then made 2 cm distal and lateral to the   anterior superior iliac spine extending over the orientation of the   tensor fascia lata muscle and sharp dissection was carried down to the   fascia of the muscle and protractor placed in the soft tissues.      The fascia was then incised.  The muscle belly was identified and swept  laterally and retractor placed along the superior neck.  Following   cauterization of the circumflex vessels and removing some pericapsular   fat, a second cobra retractor was placed on the inferior neck.  A third   retractor was placed on the anterior acetabulum after elevating the   anterior rectus.  A L-capsulotomy was along the line of the   superior neck to the trochanteric fossa, then extended proximally and   distally.  Tag sutures were placed and the retractors were then placed   intracapsular.  We then identified the trochanteric fossa and   orientation of my neck cut, confirmed this radiographically   and then made a neck osteotomy with the femur on traction.  The femoral   head was removed without difficulty or complication.  Traction was let   off and retractors were placed posterior and anterior around the   acetabulum.      The labrum and foveal tissue were debrided.  I began reaming with a 49mm   reamer and reamed up to 53mm reamer with good bony bed preparation and a 54mm   cup was chosen.  The final 54mm Pinnacle cup was then impacted under fluoroscopy  to confirm the depth of penetration  and orientation with respect to   abduction.  A screw was placed followed by the hole eliminator.  The final   36+4 neutral Altrex liner was impacted with good visualized rim fit.  The cup was positioned anatomically within the acetabular portion of the pelvis.      At this point, the femur was rolled at 80 degrees.  Further capsule was   released off the inferior aspect of the femoral neck.  I then   released the superior capsule proximally.  The hook was placed laterally   along the femur and elevated manually and held in position with the bed   hook.  The leg was then extended and adducted with the leg rolled to 100   degrees of external rotation.  Once the proximal femur was fully   exposed, I used a box osteotome to set orientation.  I then began   broaching with the starting chili pepper broach and passed this by hand and then broached up to 7.  With the 7 broach in place I chose a high offset neck, a 36+1.5 ball and did a trial reduction.  The offset was appropriate, leg lengths   appeared to be equal, confirmed radiographically.   Given these findings, I went ahead and dislocated the hip, repositioned all   retractors and positioned the right hip in the extended and abducted position.  The final 7 Hi Tri Lock stem was   chosen and it was impacted down to the level of neck cut.  Based on this   and the trial reduction, a 36+1.5 delta ceramic ball was chosen and   impacted onto a clean and dry trunnion, and the hip was reduced.  The   hip had been irrigated throughout the case again at this point.  I did   reapproximate the superior capsular leaflet to the anterior leaflet   using #1 Vicryl, placed a medium Hemovac drain deep.  The fascia of the   tensor fascia lata muscle was then reapproximated using #1 Vicryl.  The   remaining wound was closed with 2-0 Vicryl and running 4-0 Monocryl.   The hip was cleaned, dried, and dressed sterilely using Dermabond and   Aquacel dressing.  Drain  site dressed separately.  She was then brought   to recovery room in stable condition tolerating the procedure well.    Danae Orleans, PA-C was present for the entirety of the case involved from   preoperative positioning, perioperative retractor management, general   facilitation of the case, as well as primary wound closure as assistant.            Pietro Cassis Alvan Dame, M.D.            MDO/MEDQ  D:  09/27/2011  T:  09/27/2011  Job:  [id number]      Electronically Signed by Paralee Cancel M.D. on 10/03/2011 09:15:38 AM

## 2012-03-20 NOTE — Interval H&P Note (Signed)
History and Physical Interval Note:  03/20/2012 11:18 AM  Todd Gonzales  has presented today for surgery, with the diagnosis of Osteoarthritis of The Left Hip  The various methods of treatment have been discussed with the patient and family. After consideration of risks, benefits and other options for treatment, the patient has consented to  Procedure(s) (LRB): LEFT TOTAL HIP ARTHROPLASTY ANTERIOR APPROACH (Left) as a surgical intervention .  The patients' history has been reviewed, patient examined, no change in status, stable for surgery.  I have reviewed the patients' chart and labs.  Questions were answered to the patient's satisfaction.     Shelda Pal

## 2012-03-20 NOTE — Transfer of Care (Signed)
Immediate Anesthesia Transfer of Care Note  Patient: Todd Gonzales  Procedure(s) Performed: Procedure(s) (LRB): TOTAL HIP ARTHROPLASTY ANTERIOR APPROACH (Left)  Patient Location: PACU  Anesthesia Type: General  Level of Consciousness: awake, sedated and patient cooperative  Airway & Oxygen Therapy: Patient Spontanous Breathing and Patient connected to face mask oxygen  Post-op Assessment: Report given to PACU RN and Post -op Vital signs reviewed and stable  Post vital signs: Reviewed and stable  Complications: No apparent anesthesia complications

## 2012-03-20 NOTE — Anesthesia Postprocedure Evaluation (Signed)
  Anesthesia Post-op Note  Patient: Todd Gonzales  Procedure(s) Performed: Procedure(s) (LRB): TOTAL HIP ARTHROPLASTY ANTERIOR APPROACH (Left)  Patient Location: PACU  Anesthesia Type: General  Level of Consciousness: awake and alert   Airway and Oxygen Therapy: Patient Spontanous Breathing  Post-op Pain: mild  Post-op Assessment: Post-op Vital signs reviewed, Patient's Cardiovascular Status Stable, Respiratory Function Stable, Patent Airway and No signs of Nausea or vomiting  Post-op Vital Signs: stable  Complications: No apparent anesthesia complications

## 2012-03-20 NOTE — Plan of Care (Signed)
Problem: Consults Goal: Diagnosis- Total Joint Replacement Outcome: Progressing Primary Total Hip     

## 2012-03-20 NOTE — Anesthesia Preprocedure Evaluation (Signed)
Anesthesia Evaluation  Patient identified by MRN, date of birth, ID band Patient awake    Reviewed: Allergy & Precautions, H&P , NPO status , Patient's Chart, lab work & pertinent test results, reviewed documented beta blocker date and time   Airway Mallampati: II TM Distance: >3 FB Neck ROM: full    Dental  (+) Caps and Dental Advisory Given,    Pulmonary neg pulmonary ROS,  breath sounds clear to auscultation  Pulmonary exam normal       Cardiovascular Exercise Tolerance: Good hypertension, Pt. on medications Rhythm:regular Rate:Normal     Neuro/Psych negative neurological ROS  negative psych ROS   GI/Hepatic negative GI ROS, Neg liver ROS, hiatal hernia,   Endo/Other  negative endocrine ROS  Renal/GU negative Renal ROS  negative genitourinary   Musculoskeletal   Abdominal   Peds  Hematology negative hematology ROS (+)   Anesthesia Other Findings   Reproductive/Obstetrics negative OB ROS                           Anesthesia Physical Anesthesia Plan  ASA: II  Anesthesia Plan: General   Post-op Pain Management:    Induction: Intravenous  Airway Management Planned: Oral ETT  Additional Equipment:   Intra-op Plan:   Post-operative Plan: Extubation in OR  Informed Consent: I have reviewed the patients History and Physical, chart, labs and discussed the procedure including the risks, benefits and alternatives for the proposed anesthesia with the patient or authorized representative who has indicated his/her understanding and acceptance.   Dental Advisory Given  Plan Discussed with: Surgeon  Anesthesia Plan Comments:         Anesthesia Quick Evaluation

## 2012-03-21 LAB — CBC
HCT: 37.2 % — ABNORMAL LOW (ref 39.0–52.0)
Hemoglobin: 12.4 g/dL — ABNORMAL LOW (ref 13.0–17.0)
MCH: 32.1 pg (ref 26.0–34.0)
MCHC: 33.3 g/dL (ref 30.0–36.0)
MCV: 96.4 fL (ref 78.0–100.0)
Platelets: 163 10*3/uL (ref 150–400)
RBC: 3.86 MIL/uL — ABNORMAL LOW (ref 4.22–5.81)
RDW: 13 % (ref 11.5–15.5)
WBC: 9.1 10*3/uL (ref 4.0–10.5)

## 2012-03-21 LAB — BASIC METABOLIC PANEL
BUN: 12 mg/dL (ref 6–23)
CO2: 28 mEq/L (ref 19–32)
Calcium: 8.1 mg/dL — ABNORMAL LOW (ref 8.4–10.5)
Chloride: 102 mEq/L (ref 96–112)
Creatinine, Ser: 0.82 mg/dL (ref 0.50–1.35)
GFR calc Af Amer: >90 mL/min (ref >90)
GFR calc non Af Amer: 86 mL/min — ABNORMAL LOW (ref >90)
Glucose, Bld: 128 mg/dL — ABNORMAL HIGH (ref 70–99)
Potassium: 3.3 mEq/L — ABNORMAL LOW (ref 3.5–5.1)
Sodium: 138 mEq/L (ref 135–145)

## 2012-03-21 MED ORDER — DIPHENHYDRAMINE HCL 25 MG PO CAPS: 25.0000 mg | ORAL_CAPSULE | Freq: Four times a day (QID) | ORAL | Status: DC | PRN

## 2012-03-21 MED ORDER — POLYETHYLENE GLYCOL 3350 17 G PO PACK: 17.0000 g | PACK | Freq: Two times a day (BID) | ORAL | Status: AC

## 2012-03-21 MED ORDER — ASPIRIN EC 325 MG PO TBEC: 325.0000 mg | DELAYED_RELEASE_TABLET | Freq: Two times a day (BID) | ORAL | Status: AC

## 2012-03-21 MED ORDER — METHOCARBAMOL 500 MG PO TABS: 500.0000 mg | ORAL_TABLET | Freq: Four times a day (QID) | ORAL | Status: AC | PRN

## 2012-03-21 MED ORDER — DSS 100 MG PO CAPS: 100.0000 mg | ORAL_CAPSULE | Freq: Two times a day (BID) | ORAL | Status: AC

## 2012-03-21 MED ORDER — FERROUS SULFATE 325 (65 FE) MG PO TABS: 325.0000 mg | ORAL_TABLET | Freq: Three times a day (TID) | ORAL | Status: DC

## 2012-03-21 NOTE — Progress Notes (Signed)
Utilization review completed.  

## 2012-03-21 NOTE — Evaluation (Signed)
Physical Therapy Evaluation Patient Details Name: Todd Gonzales MRN: [id number] DOB: 09-Sep-1941 Today's Date: 03/21/2012  Problem List:  Patient Active Problem List  Diagnoses  . S/P Left THA, AA    Past Medical History:  Past Medical History  Diagnosis Date  . Hypertension     Dr Wylene Simmer- LOV with clearance 3/13 on chart  . H/O hiatal hernia   . Arthritis   . Hyperlipidemia   . Hemorrhoids   . IGT (impaired glucose tolerance)     last AIC 5.5- diet controlled- per office note 3/13   Past Surgical History:  Past Surgical History  Procedure Date  . Back surgery 2008    Laminectomy L4-5  . Knee arthroscopy     right  . Joint replacement 2009    right hip    PT Assessment/Plan/Recommendation PT Assessment Clinical Impression Statement: pt s/p direct anteriorLTHA will benefit from PT to maximize independence for home setting; wife checking on DME needs; PT Recommendation/Assessment: Patient will need skilled PT in the acute care venue PT Problem List: Decreased strength;Decreased range of motion;Decreased activity tolerance;Decreased mobility;Decreased knowledge of use of DME Barriers to Discharge: None PT Therapy Diagnosis : Difficulty walking PT Plan PT Frequency: 7X/week PT Treatment/Interventions: DME instruction;Gait training;Functional mobility training;Therapeutic activities;Therapeutic exercise;Patient/family education PT Recommendation Follow Up Recommendations: Home health PT Equipment Recommended:  (TBA) PT Goals  Acute Rehab PT Goals PT Goal Formulation: With patient Time For Goal Achievement: 4 days Pt will go Sit to Supine/Side: with supervision PT Goal: Sit to Supine/Side - Progress: Goal set today Pt will go Sit to Stand: with supervision;with modified independence PT Goal: Sit to Stand - Progress: Goal set today Pt will go Stand to Sit: with supervision;with modified independence PT Goal: Stand to Sit - Progress: Goal set today Pt will Ambulate:  >150 feet;with supervision;with least restrictive assistive device PT Goal: Ambulate - Progress: Goal set today Pt will Perform Home Exercise Program: with supervision, verbal cues required/provided PT Goal: Perform Home Exercise Program - Progress: Goal set today  PT Evaluation Precautions/Restrictions  Restrictions Weight Bearing Restrictions: Yes LLE Weight Bearing: Weight bearing as tolerated Prior Functioning  Home Living Lives With: Spouse Available Help at Discharge: Family Type of Home: House Home Access: Level entry Home Layout: Two level;Able to live on main level with bedroom/bathroom Additional Comments: wife checking on equipment Prior Function Level of Independence: Independent Cognition Cognition Arousal/Alertness: Awake/alert Overall Cognitive Status: Appears within functional limits for tasks assessed Orientation Level: Oriented X4 Sensation/Coordination   Extremity Assessment RUE Assessment RUE Assessment: Within Functional Limits LUE Assessment LUE Assessment: Within Functional Limits RLE Assessment RLE Assessment: Within Functional Limits LLE Assessment LLE Assessment:  (hip/knee 2+/5; ankle WFL) Mobility (including Balance) Bed Mobility Bed Mobility: Yes Supine to Sit: 4: Min assist Supine to Sit Details (indicate cue type and reason): cues for technique Transfers Transfers: Yes Sit to Stand: 4: Min assist;From bed;With upper extremity assist Sit to Stand Details (indicate cue type and reason): cues for hand placement Stand to Sit: 4: Min assist;To chair/3-in-1;With upper extremity assist;With armrests Stand to Sit Details: cues for hand placement Ambulation/Gait Ambulation/Gait: Yes Ambulation/Gait Assistance: 4: Min assist;5: Supervision Ambulation/Gait Assistance Details (indicate cue type and reason): min guard for safety and cues for RW distance from self Ambulation Distance (Feet): 40 Feet Assistive device: Rolling walker Gait Pattern:  Step-to pattern;Trunk flexed    Exercise  Total Joint Exercises Ankle Circles/Pumps: AROM;Both;10 reps End of Session PT - End of Session Activity Tolerance: Patient  tolerated treatment well Patient left: in chair Nurse Communication: Mobility status for ambulation General Behavior During Session: [facility] for tasks performed Cognition: [facility] for tasks performed  [facility] 03/21/2012, 9:08 AM

## 2012-03-21 NOTE — Progress Notes (Signed)
OT Screen Order received, chart reviewed. Spoke briefly with pt who stated that he has a handicapped accessible toilet and shower from previous sxs and will have prn A from family. Pt presents with no OT needs at this time. Will sign off.  Garrel Ridgel, OTR/L  Pager [phone] 03/21/2012

## 2012-03-21 NOTE — Progress Notes (Signed)
Subjective: 1 Day Post-Op Procedure(s) (LRB): TOTAL HIP ARTHROPLASTY ANTERIOR APPROACH (Left)   Patient reports pain as mild. Pain well controlled. No events throughout the night.   Objective:   VITALS:   Filed Vitals:   03/21/12 0543  BP: 124/80  Pulse: 75  Temp: 98 F (36.7 C)  Resp: 16    Neurovascular intact Dorsiflexion/Plantar flexion intact Incision: dressing C/D/I No cellulitis present Compartment soft  LABS  Basename 03/21/12 0403  HGB 12.4*  HCT 37.2*  WBC 9.1  PLT 163     Basename 03/21/12 0403  NA 138  K 3.3*  BUN 12  CREATININE 0.82  GLUCOSE 128*     Assessment/Plan: 1 Day Post-Op Procedure(s) (LRB): TOTAL HIP ARTHROPLASTY ANTERIOR APPROACH (Left)   HV drain d/c'ed Foley cath d/c'ed Advance diet Up with therapy D/C IV fluids Plan for discharge tomorrow to home, if he continues to do well   Anastasio Auerbach. Jailah Willis   PAC  03/21/2012, 8:33 AM

## 2012-03-21 NOTE — Discharge Summary (Signed)
Physician Discharge Summary  Patient ID: Todd Gonzales MRN: [id number] DOB/AGE: [birth year] 71 y.o.  Admit date: 03/20/2012 Discharge date: 03/22/2012  Procedures:  Procedure(s) (LRB): TOTAL HIP ARTHROPLASTY ANTERIOR APPROACH (Left)  Attending Physician:  Dr. Durene Gonzales   Admission Diagnoses: End-stage osteoarthritis, left hip.  Discharge Diagnoses:  Principal Problem:  *S/P Left THA, AA HTN OA Glucose intolerance, diet controlled  HPI: This is a 71 year old gentleman with a history of osteoarthritis of his left hip who has failed conservative treatment. At this time, the patient is here to have a total hip arthroplasty of the left hip by anterior approach. The surgery's benefits and aftercare were discussed in detail with the patient, questions invited and answered. The patient is a candidate for tranexamic acid, but is not a candidate for dexamethasone. He is planning on going home after surgery. He is given his home medications of aspirin, iron, MiraLAX, Colace and Robaxin. Note that his medical doctor is Dr. Wylene Gonzales.  PCP: Todd Garbe, MD, MD   Discharged Condition: good  Hospital Course:  Patient underwent the above stated procedure on 03/20/2012. Patient tolerated the procedure well and brought to the recovery room in good condition and subsequently to the floor.  POD #1 BP: 124/80 ; Pulse: 75 ; Temp: 98 F (36.7 C) ; Resp: 16  Pt's foley was removed, as well as the hemovac drain removed. IV was changed to a saline lock. Patient reports pain as mild. Pain well controlled. No events throughout the night.  Neurovascular intact, dorsiflexion/plantar flexion intact, incision: dressing C/Gonzales/I, no cellulitis present and compartment soft.   LABS  Basename  03/21/12 0403   HGB  12.4  HCT  37.2   POD #2  BP: 131/81 ; Pulse: 61 ; Temp: 97.6 F (36.4 C) ; Resp: 16  Patient reports pain as mild. Pain well controlled. No events throughout the night. Ready to be discharged  home. Neurovascular intact, dorsiflexion/plantar flexion intact, incision: dressing C/Gonzales/I, no cellulitis present and compartment soft.   LABS  Basename  03/22/12 0428   HGB  12.3  HCT  36.5    Discharge Exam: General appearance: alert, cooperative and no distress Extremities: Homans sign is negative, no sign of DVT, no edema, redness or tenderness in the calves or thighs and no ulcers, gangrene or trophic changes  Disposition:  Home with follow up in 2 weeks  Follow-up Information    Follow up with Todd Gonzales in 2 weeks.   Contact information:   [facility] [address] [place] [address] [phone]          Discharge Orders    Future Orders Please Complete By Expires   Diet - low sodium heart healthy      Call MD / Call 911      Comments:   If you experience chest pain or shortness of breath, CALL 911 and be transported to the hospital emergency room.  If you develope a fever above 101 F, pus (white drainage) or increased drainage or redness at the wound, or calf pain, call your surgeon's office.   Discharge instructions      Comments:   Maintain surgical dressing for 8 days, then replace with gauze and tape. Keep the area dry and clean until follow up. Follow up in 2 weeks at [facility]. Call with any questions or concerns.     Constipation Prevention      Comments:   Drink plenty of fluids.  Prune juice  may be helpful.  You may use a stool softener, such as Colace (over the counter) 100 mg twice a day.  Use MiraLax (over the counter) for constipation as needed.   Increase activity slowly as tolerated      Weight Bearing as taught in Physical Therapy      Comments:   Use a walker or crutches as instructed.   Driving restrictions      Comments:   No driving for 4 weeks   Change dressing      Comments:   Maintain surgical dressing for 8 days, then replace with 4x4 guaze and tape. Keep the area dry and  clean.   TED hose      Comments:   Use stockings (TED hose) for 2 weeks on both leg(s).  You may remove them at night for sleeping.       Current Discharge Medication List    START taking these medications   Details  aspirin EC 325 MG tablet Take 1 tablet (325 mg total) by mouth 2 (two) times daily. X 4 weeks Qty: 60 tablet, Refills: 0    diphenhydrAMINE (BENADRYL) 25 mg capsule Take 1 capsule (25 mg total) by mouth every 6 (six) hours as needed for itching, allergies or sleep.    docusate sodium 100 MG CAPS Take 100 mg by mouth 2 (two) times daily.    ferrous sulfate 325 (65 FE) MG tablet Take 1 tablet (325 mg total) by mouth 3 (three) times daily after meals.    HYDROcodone-acetaminophen (NORCO) 7.5-325 MG per tablet Take 1-2 tablets by mouth every 4 (four) hours as needed for pain. Qty: 120 tablet, Refills: 0    methocarbamol (ROBAXIN) 500 MG tablet Take 1 tablet (500 mg total) by mouth every 6 (six) hours as needed (muscle spasms).    polyethylene glycol (MIRALAX / GLYCOLAX) packet Take 17 g by mouth 2 (two) times daily.      CONTINUE these medications which have NOT CHANGED   Details  benazepril-hydrochlorthiazide (LOTENSIN HCT) 20-12.5 MG per tablet Take 1 tablet by mouth daily after breakfast.    celecoxib (CELEBREX) 200 MG capsule Take 200 mg by mouth daily.    Menthol, Topical Analgesic, (BIOFREEZE) 10 % LIQD Apply 1 application topically 2 (two) times daily as needed. Pain     Multiple Vitamin (MULITIVITAMIN WITH MINERALS) TABS Take 1 tablet by mouth daily.    oxymetazoline (AFRIN) 0.05 % nasal spray Place 2 sprays into the nose 2 (two) times daily as needed. Allergies     sodium chloride (OCEAN) 0.65 % nasal spray Place 1 spray into the nose as needed.      STOP taking these medications     traMADol (ULTRAM) 50 MG tablet Comments:  Reason for Stopping:       diclofenac sodium (VOLTAREN) 1 % GEL Comments:  Reason for Stopping:           Signed:  Anastasio Gonzales. Todd Gonzales   PAC  03/22/2012, 7:32 AM

## 2012-03-21 NOTE — Progress Notes (Signed)
03/21/12 1200  PT Visit Information  Last PT Received On 03/21/12  Restrictions  Weight Bearing Restrictions Yes  LLE Weight Bearing WBAT  Bed Mobility  Sit to Supine 4: Min assist;HOB flat  Sit to Supine - Details (indicate cue type and reason) cues for technique  Transfers  Sit to Stand 4: Min assist;5: Supervision;From chair/3-in-1;With upper extremity assist  Sit to Stand Details (indicate cue type and reason) cues for hand placement  Stand to Sit 4: Min assist;5: Supervision;To bed;With upper extremity assist  Stand to Sit Details cues for hand placement  Ambulation/Gait  Ambulation/Gait Assistance 4: Min assist;5: Supervision  Ambulation/Gait Assistance Details (indicate cue type and reason) min guard for safety and cues for RW distance from self  Ambulation Distance (Feet) 45 Feet  Assistive device Rolling walker  Gait Pattern Step-to pattern;Trunk flexed  Total Joint Exercises  Ankle Circles/Pumps AROM;Both;10 reps  Quad Sets AROM;Both;10 reps  Heel Slides AAROM;Left;10 reps  Hip ABduction/ADduction Left;10 reps;AAROM  PT - End of Session  Activity Tolerance Patient tolerated treatment well  Patient left in bed;in CPM  Nurse Communication Mobility status for ambulation  General  Behavior During Session [facility] for tasks performed  Cognition [facility] for tasks performed  PT - Assessment/Plan  Comments on Treatment Session pt progressing well; hopeful to D/C tomorrow  PT Plan Discharge plan remains appropriate;Frequency remains appropriate  PT Frequency 7X/week  Follow Up Recommendations Home health PT  Equipment Recommended None recommended by PT  Acute Rehab PT Goals  Pt will go Sit to Stand with supervision;with modified independence  PT Goal: Sit to Stand - Progress Progressing toward goal  Pt will go Stand to Sit with supervision;with modified independence  PT Goal: Stand to Sit - Progress Progressing toward goal  Pt will Ambulate >150 feet;with supervision;with least  restrictive assistive device  PT Goal: Ambulate - Progress Progressing toward goal  Pt will Perform Home Exercise Program with supervision, verbal cues required/provided  PT Goal: Perform Home Exercise Program - Progress Progressing toward goal

## 2012-03-22 LAB — BASIC METABOLIC PANEL
BUN: 14 mg/dL (ref 6–23)
CO2: 28 mEq/L (ref 19–32)
Calcium: 8.5 mg/dL (ref 8.4–10.5)
Chloride: 102 mEq/L (ref 96–112)
Creatinine, Ser: 0.75 mg/dL (ref 0.50–1.35)
GFR calc Af Amer: >90 mL/min (ref >90)
GFR calc non Af Amer: 89 mL/min — ABNORMAL LOW (ref >90)
Glucose, Bld: 136 mg/dL — ABNORMAL HIGH (ref 70–99)
Potassium: 3.7 mEq/L (ref 3.5–5.1)
Sodium: 137 mEq/L (ref 135–145)

## 2012-03-22 LAB — CBC
HCT: 36.5 % — ABNORMAL LOW (ref 39.0–52.0)
Hemoglobin: 12.3 g/dL — ABNORMAL LOW (ref 13.0–17.0)
MCH: 31.9 pg (ref 26.0–34.0)
MCHC: 33.7 g/dL (ref 30.0–36.0)
MCV: 94.8 fL (ref 78.0–100.0)
Platelets: 168 10*3/uL (ref 150–400)
RBC: 3.85 MIL/uL — ABNORMAL LOW (ref 4.22–5.81)
RDW: 12.8 % (ref 11.5–15.5)
WBC: 11.6 10*3/uL — ABNORMAL HIGH (ref 4.0–10.5)

## 2012-03-22 MED ORDER — HYDROCODONE-ACETAMINOPHEN 7.5-325 MG PO TABS: 1.0000 | ORAL_TABLET | ORAL | Status: AC | PRN

## 2012-03-22 NOTE — Progress Notes (Signed)
Subjective: 2 Days Post-Op Procedure(s) (LRB): TOTAL HIP ARTHROPLASTY ANTERIOR APPROACH (Left)   Patient reports pain as mild. Pain well controlled. No events throughout the night. Ready to be discharged home.  Objective:   VITALS:   Filed Vitals:   03/22/12 0545  BP: 131/81  Pulse: 61  Temp: 97.6 F (36.4 C)  Resp: 16    Neurovascular intact Dorsiflexion/Plantar flexion intact Incision: dressing C/D/I No cellulitis present Compartment soft  LABS  Basename 03/22/12 0428 03/21/12 0403  HGB 12.3* 12.4*  HCT 36.5* 37.2*  WBC 11.6* 9.1  PLT 168 163     Basename 03/22/12 0428 03/21/12 0403  NA 137 138  K 3.7 3.3*  BUN 14 12  CREATININE 0.75 0.82  GLUCOSE 136* 128*     Assessment/Plan: 2 Days Post-Op Procedure(s) (LRB): TOTAL HIP ARTHROPLASTY ANTERIOR APPROACH (Left)   Up with therapy Discharge home with home health today, after PTx1 Follow up in 2 weeks at [facility].  Follow-up Information    Follow up with OLIN,Quill Grinder D in 2 weeks.   Contact information:   [facility] [address] [place] [address] [phone]          Anastasio Auerbach. Shirly Bartosiewicz   PAC  03/22/2012, 7:28 AM

## 2012-03-22 NOTE — Progress Notes (Signed)
Physical Therapy Treatment Patient Details Name: Todd Gonzales MRN: [id number] DOB: [birth year] Today's Date: 03/22/2012  PT Assessment/Plan  PT - Assessment/Plan Comments on Treatment Session: progressing well, c/o only of soreness left hip, had muscle relaxer PT Plan: Discharge plan remains appropriate;Frequency remains appropriate PT Frequency: 7X/week Follow Up Recommendations: Home health PT Equipment Recommended: None recommended by PT PT Goals  Acute Rehab PT Goals Pt will go Sit to Stand: with supervision;with modified independence PT Goal: Sit to Stand - Progress: Met Pt will go Stand to Sit: with supervision;with modified independence PT Goal: Stand to Sit - Progress: Met Pt will Ambulate: >150 feet;with supervision;with least restrictive assistive device PT Goal: Ambulate - Progress: Partly met Pt will Perform Home Exercise Program: with supervision, verbal cues required/provided PT Goal: Perform Home Exercise Program - Progress: Met  PT Treatment Precautions/Restrictions  Restrictions Weight Bearing Restrictions: No LLE Weight Bearing: Weight bearing as tolerated Mobility (including Balance) Transfers Sit to Stand: 5: Supervision;6: Modified independent (Device/Increase time);From chair/3-in-1;With upper extremity assist Stand to Sit: 5: Supervision;6: Modified independent (Device/Increase time);To chair/3-in-1;With upper extremity assist;With armrests Ambulation/Gait Ambulation/Gait Assistance: 5: Supervision;6: Modified independent (Device/Increase time) Ambulation/Gait Assistance Details (indicate cue type and reason): cues step through Ambulation Distance (Feet): 100 Feet Assistive device: Rolling walker Gait Pattern: Step-through pattern    Exercise  Total Joint Exercises Ankle Circles/Pumps: AROM;Both;10 reps Quad Sets: AROM;Both;10 reps Hip ABduction/ADduction: Left;10 reps;Standing;AROM Knee Flexion: Left;10 reps;Standing;AROM Marching in Standing:  Left;AROM;10 reps;Standing General Exercises - Lower Extremity Long Arc Quad: AROM;Left;10 reps;Seated End of Session PT - End of Session Activity Tolerance: Patient tolerated treatment well Patient left: in chair;with call bell in reach General Behavior During Session: [facility] for tasks performed Cognition: [facility] for tasks performed  [facility] 03/22/2012, 9:56 AM

## 2012-03-22 NOTE — Progress Notes (Signed)
CARE MANAGEMENT NOTE 03/22/2012  Patient:  Todd Gonzales, Todd Gonzales   Account Number:  [id number]  Date Initiated:  03/22/2012  Documentation initiated by:  Colleen Can  Subjective/Objective Assessment:   DX OSTEOARTHRITIS LEFT HIP; TOTAL HIP REPLACEMNT-ANTERIOR APPROACH     Action/Plan:   CM SPPOKE WITH PATIENT. PT PLANS TO RETURN TO HIS HOME IN [facility] WHERE SPOUSE WILL BE CAREGIVER. ALREADY HAS DME FROM PREVIOUS SURGERIES.   Anticipated DC Date:  03/22/2012   Anticipated DC Plan:  HOME W HOME HEALTH SERVICES  In-house referral  NA      DC Planning Services  CM consult      [facility] Choice  HOME HEALTH   Choice offered to / List presented to:  C-1 Patient   DME arranged  NA      DME agency  NA     HH arranged  HH-2 PT      [facility] agency  [facility]   Status of service:  Completed, signed off Medicare Important Message given?   (If response is "NO", the following Medicare IM given date fields will be blank) Date Medicare IM given:   Date Additional Medicare IM given:    Discharge Disposition:  HOME W HOME HEALTH SERVICES  Per UR Regulation:    If discussed at Long Length of Stay Meetings, dates discussed:    Comments:  03/22/2012 pT IS REQUESTING [facility] FOR HH SERVICES-HAS USED THEM IN THE PAST. LIST OF AGENCIES IN SHADOW CHART. PT FOR D/C TODAY/hh SERVICES TO START TOMORROW

## 2012-03-30 ENCOUNTER — Encounter (HOSPITAL_COMMUNITY): Payer: Self-pay | Admitting: Orthopedic Surgery

## 2013-06-11 ENCOUNTER — Observation Stay: Payer: Self-pay | Admitting: Family Medicine

## 2013-06-11 LAB — CBC
HCT: 45.1 % (ref 40.0–52.0)
HGB: 15.4 g/dL (ref 13.0–18.0)
MCH: 32 pg (ref 26.0–34.0)
MCHC: 34.2 g/dL (ref 32.0–36.0)
MCV: 94 fL (ref 80–100)
Platelet: 172 10*3/uL (ref 150–440)
RBC: 4.81 10*6/uL (ref 4.40–5.90)
RDW: 13.6 % (ref 11.5–14.5)
WBC: 16.1 10*3/uL — ABNORMAL HIGH (ref 3.8–10.6)

## 2013-06-11 LAB — COMPREHENSIVE METABOLIC PANEL
Albumin: 3.4 g/dL (ref 3.4–5.0)
Alkaline Phosphatase: 64 U/L (ref 50–136)
Anion Gap: 10 (ref 7–16)
BUN: 15 mg/dL (ref 7–18)
Bilirubin,Total: 2.8 mg/dL — ABNORMAL HIGH (ref 0.2–1.0)
Calcium, Total: 8.5 mg/dL (ref 8.5–10.1)
Chloride: 106 mmol/L (ref 98–107)
Co2: 27 mmol/L (ref 21–32)
Creatinine: 1.03 mg/dL (ref 0.60–1.30)
EGFR (African American): >60
EGFR (Non-African Amer.): >60
Glucose: 127 mg/dL — ABNORMAL HIGH (ref 65–99)
Osmolality: 287 (ref 275–301)
Potassium: 3.3 mmol/L — ABNORMAL LOW (ref 3.5–5.1)
SGOT(AST): 20 U/L (ref 15–37)
SGPT (ALT): 26 U/L (ref 12–78)
Sodium: 143 mmol/L (ref 136–145)
Total Protein: 6.9 g/dL (ref 6.4–8.2)

## 2013-06-11 LAB — URINALYSIS, COMPLETE
Bilirubin,UR: NEGATIVE
Glucose,UR: NEGATIVE mg/dL (ref 0–75)
Nitrite: POSITIVE
Ph: 7 (ref 4.5–8.0)
Protein: 100
RBC,UR: 4 /HPF (ref 0–5)
Specific Gravity: 1.015 (ref 1.003–1.030)
Squamous Epithelial: NONE SEEN
WBC UR: 54 /HPF (ref 0–5)

## 2013-06-11 LAB — TROPONIN I: Troponin-I: <0.02 ng/mL

## 2013-06-12 LAB — CBC WITH DIFFERENTIAL/PLATELET
Basophil #: 0 10*3/uL (ref 0.0–0.1)
Basophil %: 0.2 %
Eosinophil #: 0 10*3/uL (ref 0.0–0.7)
Eosinophil %: 0 %
HCT: 40.5 % (ref 40.0–52.0)
HGB: 14.1 g/dL (ref 13.0–18.0)
Lymphocyte #: 0.6 10*3/uL — ABNORMAL LOW (ref 1.0–3.6)
Lymphocyte %: 4.3 %
MCH: 32.8 pg (ref 26.0–34.0)
MCHC: 34.9 g/dL (ref 32.0–36.0)
MCV: 94 fL (ref 80–100)
Monocyte #: 1 x10 3/mm (ref 0.2–1.0)
Monocyte %: 6.7 %
Neutrophil #: 13.5 10*3/uL — ABNORMAL HIGH (ref 1.4–6.5)
Neutrophil %: 88.8 %
Platelet: 138 10*3/uL — ABNORMAL LOW (ref 150–440)
RBC: 4.31 10*6/uL — ABNORMAL LOW (ref 4.40–5.90)
RDW: 13.7 % (ref 11.5–14.5)
WBC: 15.2 10*3/uL — ABNORMAL HIGH (ref 3.8–10.6)

## 2013-06-12 LAB — POTASSIUM: Potassium: 3.4 mmol/L — ABNORMAL LOW (ref 3.5–5.1)

## 2013-06-13 LAB — URINE CULTURE

## 2013-06-16 LAB — CULTURE, BLOOD (SINGLE)

## 2014-08-18 ENCOUNTER — Ambulatory Visit (INDEPENDENT_AMBULATORY_CARE_PROVIDER_SITE_OTHER): Payer: Medicare Other | Admitting: Podiatry

## 2014-08-18 ENCOUNTER — Encounter: Payer: Self-pay | Admitting: Podiatry

## 2014-08-18 VITALS — BP 119/62 | HR 81 | Resp 16 | Ht 72.0 in | Wt 240.0 lb

## 2014-08-18 DIAGNOSIS — B079 Viral wart, unspecified: Secondary | ICD-10-CM

## 2014-08-18 NOTE — Progress Notes (Signed)
   Subjective:    Patient ID: Todd Gonzales, male    DOB: July 10, 1941, 73 y.o.   MRN: [id number]  HPI Comments: i have something on the bottom of my left foot under my 2nd toe. Ive had it for 1 month. It does not hurt. i know its there when im walking and standing. i havent done anything for it.   Foot Pain      Review of Systems  All other systems reviewed and are negative.      Objective:   Physical Exam: I have reviewed his past medical history medications allergies surgeries social history review systems. Pulses are strongly palpable bilateral. Neurologic sensorium is intact per since once the monofilament. Deep tendon reflexes are intact bilateral muscle strength is 5 over 5 dorsiflexors plantar flexors inverters everters all intrinsic musculature is intact. Orthopedic evaluation Mr. is all joints distal to the ankle a full range of motion without crepitation. Cutaneous evaluation demonstrates supple well hydrated cutis to his all verrucoid lesions 1 to the plantar lateral aspect of the hallux left and to the plantar tuft of the fifth toe left. These do demonstrate typical verrucoid appearance such as thrombosed capillaries and skin lines circumventing the lesion. These lesions are less than 1 cm in diameter.        Assessment & Plan:  Assessment: Verruca plantaris left foot hallux and fifth toe.  Plan: Chemical destruction of lesion under occlusion. He was instructed to wash this thoroughly the following morning

## 2014-09-22 ENCOUNTER — Ambulatory Visit: Payer: Medicare Other | Admitting: Podiatry

## 2014-09-25 DIAGNOSIS — C44320 Squamous cell carcinoma of skin of unspecified parts of face: Secondary | ICD-10-CM | POA: Insufficient documentation

## 2014-09-26 ENCOUNTER — Other Ambulatory Visit: Payer: Self-pay | Admitting: Neurological Surgery

## 2014-09-26 DIAGNOSIS — M5416 Radiculopathy, lumbar region: Secondary | ICD-10-CM

## 2014-10-01 ENCOUNTER — Ambulatory Visit
Admission: RE | Admit: 2014-10-01 | Discharge: 2014-10-01 | Disposition: A | Discharge status: Home / Self Care | Payer: Medicare Other | Source: Ambulatory Visit | Attending: Neurological Surgery | Admitting: Neurological Surgery

## 2014-10-01 DIAGNOSIS — M5416 Radiculopathy, lumbar region: Secondary | ICD-10-CM

## 2014-10-01 MED ORDER — GADOBENATE DIMEGLUMINE 529 MG/ML IV SOLN
20.0000 mL | Freq: Once | INTRAVENOUS | Status: AC | PRN
  Administered 2014-10-01: 20 mL via INTRAVENOUS

## 2014-10-04 ENCOUNTER — Other Ambulatory Visit: Payer: Medicare Other

## 2014-10-16 ENCOUNTER — Other Ambulatory Visit: Payer: Self-pay | Admitting: Neurological Surgery

## 2014-10-16 DIAGNOSIS — M4802 Spinal stenosis, cervical region: Secondary | ICD-10-CM

## 2014-10-16 DIAGNOSIS — M4804 Spinal stenosis, thoracic region: Secondary | ICD-10-CM

## 2014-10-28 DIAGNOSIS — M1990 Unspecified osteoarthritis, unspecified site: Secondary | ICD-10-CM | POA: Insufficient documentation

## 2014-11-03 ENCOUNTER — Ambulatory Visit
Admission: RE | Admit: 2014-11-03 | Discharge: 2014-11-03 | Disposition: A | Discharge status: Home / Self Care | Payer: Medicare Other | Source: Ambulatory Visit | Attending: Neurological Surgery | Admitting: Neurological Surgery

## 2014-11-03 DIAGNOSIS — M4804 Spinal stenosis, thoracic region: Secondary | ICD-10-CM

## 2014-11-03 DIAGNOSIS — M4802 Spinal stenosis, cervical region: Secondary | ICD-10-CM

## 2014-11-06 ENCOUNTER — Other Ambulatory Visit: Payer: Self-pay | Admitting: Neurological Surgery

## 2014-11-06 DIAGNOSIS — M546 Pain in thoracic spine: Secondary | ICD-10-CM

## 2014-11-06 DIAGNOSIS — M542 Cervicalgia: Secondary | ICD-10-CM

## 2014-11-06 DIAGNOSIS — M4802 Spinal stenosis, cervical region: Secondary | ICD-10-CM

## 2014-11-06 DIAGNOSIS — M4804 Spinal stenosis, thoracic region: Secondary | ICD-10-CM

## 2014-11-19 ENCOUNTER — Ambulatory Visit
Admission: RE | Admit: 2014-11-19 | Discharge: 2014-11-19 | Disposition: A | Discharge status: Home / Self Care | Payer: Medicare Other | Source: Ambulatory Visit | Attending: Neurological Surgery | Admitting: Neurological Surgery

## 2014-11-19 DIAGNOSIS — M4802 Spinal stenosis, cervical region: Secondary | ICD-10-CM

## 2014-11-19 DIAGNOSIS — M4804 Spinal stenosis, thoracic region: Secondary | ICD-10-CM

## 2015-03-27 NOTE — Discharge Summary (Signed)
PATIENT NAME:  Todd Gonzales, Todd Gonzales MR#:  [id number] DATE OF BIRTH:  [birth year]  DATE OF ADMISSION:  06/11/2013 DATE OF DISCHARGE:  06/12/2013  REASON FOR ADMISSION: Rigors, chills and dysuria.   DISCHARGE DIAGNOSES: 1.  Systemic inflammatory response syndrome.  2.  Urinary tract infection with gram-negative rods.  3.  Leukocytosis.  4.  Osteoarthritis.  5.  Hypertension.  6.  Hypokalemia.   DISPOSITION: Home.   HOSPITAL COURSE: This is a very nice 74 year old gentleman who presented to the hospital with a history of increase in urinary frequency, dysuria, rigors and chills. The patient was taking some Motrin for the rigors and chills and did not have any significant improvement. He was not taking his temperature at home, but here in the ER, his temperature was 101.5. He was tachycardic with a heart rate of 120 and his urinalysis showed increased white blood cells. The patient was treated empirically with Rocephin and his urinalysis started growing gram-negative rods. By the next day of hospitalization, the patient was feeling significantly better. He really did not want to stay here in the hospital.   PHYSICAL EXAMINATION:   VITAL SIGNS: His temperature was 99.1. His heart rate was 92. His respirations were 20, his blood pressure was 124/78. His oxygen saturation was 95%.  GENERAL: He was alert and oriented x 3, no acute distress, no respiratory distress.  CARDIOVASCULAR: Regular rate and rhythm. No murmurs, rubs or gallops.  LUNGS: Clear.  ABDOMEN: Soft, nontender, nondistended.  EXTREMITIES: No edema, cyanosis or clubbing.  NEUROLOGIC: The patient was awake, alert x 3. No acute distress. No respiratory distress.  SKIN: Without any significant rashes.   LABORATORY DATA: The patient's labs show a glucose of 127, BUN of 15, creatinine of 1.03, sodium 143. Troponin was negative. His white blood cells were 16.1. His platelet count was 172. His urinalysis had positive nitrites, leukocyte esterase  and 54 white blood cells. Again, his culture is growing gram-negative rods.   The patient has significant improvement and he really does not want to stay over here. We are going to discharge him home on cefuroxime and then follow up with his primary care physician. If there are any changes in his condition, he is going to come back and will see either his primary care or the ER.   DISCHARGE MEDICATIONS: Lotensin/hydrochlorothiazide 20 mg/12.5 mg once a day, Celebrex 20 mg once daily, Ceftin 250 mg 2 times a day for 7 days, Flomax 0.4 mg at bedtime. We started Flomax because the patient is having significant urinary retention symptoms with increased frequency, not feeling that he is emptying, he has dribbling, he needs to push every time he goes to the bathroom, bear down to empty his bladder, for what we are going to start  Flomax.   The patient is to follow up with his primary care physician, Dr. Osborne Casco, at [facility] in 7 days.   TIME SPENT: I spent about 35 minutes with this discharge.   ____________________________ Manchester Sink, MD [email or web address] D: 06/12/2013 13:31:16 ET T: 06/12/2013 14:30:28 ET JOB#: [id number]  cc: [place]mont Sink, MD, <Dictator> Dr. Laurier Nancy America Brown MD ELECTRONICALLY SIGNED 06/16/2013 13:25

## 2015-03-27 NOTE — H&P (Signed)
PATIENT NAME:  Todd Gonzales, Todd Gonzales MR#:  [id number] DATE OF BIRTH:  06-Jul-1941  DATE OF ADMISSION:  06/11/2013  PRIMARY CARE PHYSICIAN: Located in [place] at [facility].   CHIEF COMPLAINT: Rigors, chills, and also dysuria.   HISTORY OF PRESENT ILLNESS: This is a 74 year old male who presents to the hospital with a 1-day history of frequency of urination, dysuria, and also rigors and chills. The patient tried to take some Motrin for his rigors and chills, although did not measure his temperature. He was feeling a bit weak, lethargic, and became quite diaphoretic and therefore came to the ER. In the Emergency Room, the patient was noted to have a fever of 101.5 and noted to be tachycardic with a pulse in the 120s. His urinalysis was abnormal. He was noted to have a UTI and likely systemic inflammatory response syndrome secondary to UTI. Hospitalist services were contacted for further treatment and evaluation. The patient presently denies any chest pain, shortness of breath, nausea, vomiting, abdominal pain, or any other associated symptoms associated symptoms presently.   REVIEW OF SYSTEMS:    CONSTITUTIONAL: Positive documented fever of 101.5. Positive generalized weakness. No weight gain. No weight loss.  EYES: No blurred or double vision.  EARS, NOSE, THROAT: No tinnitus. No postnasal drip. No redness of the oropharynx.  RESPIRATORY: No cough, no wheeze, no hemoptysis, no dyspnea.  CARDIOVASCULAR: No chest pain, no orthopnea, no palpitations, no syncope.  GASTROINTESTINAL: No nausea, no vomiting, no diarrhea, no abdominal pain, no melena or hematochezia.  GENITOURINARY: Positive dysuria. Positive frequency. No hematuria.  ENDOCRINE: No polyuria,  nocturia, heat or cold intolerance.  HEMATOLOGIC: No anemia, no bruising, no bleeding.  INTEGUMENTARY: No rashes. No lesions.  MUSCULOSKELETAL: No arthritis, no swelling, no gout.  NEUROLOGIC: No numbness, no tingling, no ataxia, no  seizure-type activity.  PSYCHIATRIC: No anxiety. No insomnia. No ADD.    PAST MEDICAL HISTORY: Consistent with hypertension and osteoarthritis.   ALLERGIES: No known drug allergies.   SOCIAL HISTORY: No smoking. No alcohol abuse. No illicit drug abuse. Lives at home with his wife.   FAMILY HISTORY: Mother died at age 50. She had arthritis. Father died from complications of lung cancer. He was a smoker, age 63.   CURRENT MEDICATIONS: Celebrex 200 mg daily and Lotensin/HCTZ 20/12.5 one tab daily.   PHYSICAL EXAMINATION:  VITAL SIGNS: Presently noted to be temperature 101.5, pulse 99, respirations 24, blood pressure 112/68, sats 95% on room air.  GENERAL: He is a pleasant-appearing male, no apparent distress.  HEENT: Atraumatic, normocephalic. Extraocular muscles are intact. Pupils equal and reactive to light. Sclerae anicteric. No conjunctival injection. No pharyngeal erythema.  NECK: Supple. There is no jugular venous distention, no bruits, no lymphadenopathy, no thyromegaly.  HEART: Tachycardic, regular. No murmurs. No rubs. No clicks.  LUNGS: Clear to auscultation bilaterally. No rales, no rhonchi, no wheezes.  ABDOMEN: Soft, flat, nontender, nondistended. Has good bowel sounds. No hepatosplenomegaly appreciated.  EXTREMITIES: No evidence of any cyanosis, clubbing or peripheral edema. Has +2 pedal and radial pulses bilaterally.  NEUROLOGIC: The patient is alert, awake, oriented x 3 with no focal motor or sensory deficits appreciated bilaterally.  SKIN: Moist and warm with no rash appreciated.  LYMPHATIC: There is no cervical or axillary lymphadenopathy.   LABORATORY AND RADIOLOGICAL DATA: Serum glucose 127, BUN 15, creatinine 1.03, sodium 143, potassium 3.3, chloride 106, bicarbonate 27. LFTs are within normal limits. Troponin less than 0.02. White cell count 16.1, hemoglobin 15.4, hematocrit 45.1, platelet count 172.  Urinalysis shows positive nitrites, 3+ leukocyte esterase, 54 white  cells and trace bacteria.   The patient did have a chest x-ray done, which showed mild bibasilar opacity secondary to atelectasis.   ASSESSMENT AND PLAN: This is a 74 year old male with a history of hypertension and osteoarthritis who presents to the hospital due to rigors, fevers, dysuria and frequency of urination. Noted to have  systemic inflammatory response syndrome secondary to urinary tract infection. 1.  Systemic inflammatory response syndrome. This is likely the presenting diagnosis given his fever, tachycardia and diaphoresis. This is likely secondary to urinary tract infection given his abnormal urinalysis. The patient got 1 dose of Zosyn in the Emergency Room. For now, I will switch him to intravenous ceftriaxone, follow urine cultures, follow hemodynamics. I will also give him aggressive intravenous fluids, follow fever curve. Use Tylenol as an antipyretic.  2.  Urinary tract infection, likely the cause of the systemic inflammatory response syndrome. Again, as mentioned, the patient received 1 dose of intravenous Zosyn in the Emergency Room. I will switch the patient to intravenous ceftriaxone for now, follow urine cultures. The patient can likely be switched to an oral antibiotic by the morning and be discharged home if he is clinically doing better.  3.  Leukocytosis, likely secondary to urinary tract infection. Will follow after antibiotic therapy.  4.  Osteoarthritis. Continue Celebrex. 5.  Hypertension. The patient is presently hemodynamically stable. I will hold his Lotensin/hydrochlorothiazide for now.  6.  Hypokalemia. Will replace his potassium accordingly and repeat it in the morning.   CODE STATUS: The patient is a full code.   TIME SPENT: 45 minutes.    ____________________________ Belia Heman. Verdell Carmine, MD [email or web address] D: 06/11/2013 18:17:34 ET T: 06/11/2013 19:07:32 ET JOB#: [id number]  cc: Belia Heman. Verdell Carmine, MD, <Dictator> Henreitta Leber MD ELECTRONICALLY SIGNED 06/17/2013  20:32

## 2015-04-24 ENCOUNTER — Encounter: Payer: Self-pay | Admitting: Gastroenterology

## 2015-08-11 ENCOUNTER — Ambulatory Visit (INDEPENDENT_AMBULATORY_CARE_PROVIDER_SITE_OTHER): Payer: Medicare Other | Admitting: Podiatry

## 2015-08-11 ENCOUNTER — Encounter: Payer: Self-pay | Admitting: Podiatry

## 2015-08-11 VITALS — BP 110/58 | HR 91 | Resp 18

## 2015-08-11 DIAGNOSIS — L600 Ingrowing nail: Secondary | ICD-10-CM

## 2015-08-11 NOTE — Progress Notes (Signed)
   Subjective:    Patient ID: Todd Gonzales, male    DOB: 1940-12-28, 74 y.o.   MRN: [id number]  HPI  74 year old male presents the operative concerns of an ingrown toenail to his left hallux and second digit toenail. He states that he had a pedicure about 2 weeks ago however they do not remove all the ingrown toenail. He'll be going on vacation to [place] on Thursday for 2 weeks. Denies any redness or drainage from around the nail sites. He states it is only tender the tip of the toenail with pressure in certain shoes. No other complaints at this time.   Review of Systems  All other systems reviewed and are negative.      Objective:   Physical Exam AAO 3, NAD DP/PT pulses are palpable, CRT less than 3 seconds Protective sensation intact with Derrel Nip monofilament There is evidence of incurvation of both the medial and lateral aspects of the hallux and second digit toenail on the left foot. There is no surrounding erythema, edema, drainage/purulence. There is tenderness on the distal aspect of the toenail. There is no pain on the proximal medial/lateral nail border. No other areas of tenderness to bilateral lower chemise. Open lesions or pre-ulcerative lesions. No pain with calf compression, swelling, warmth, erythema.       Assessment & Plan:  74 year old male left hallux and second digit ingrown toenail -Treatment options discussed including all alternatives, risks, and complications -I discussed partial nail avulsion with the patient however since he'll be going [place] on Thursday for 2 weeks and there is no signs of infection discussed nail debridement. He would like to proceed with this. The nails are sharply debrided without complication/pain. If the areas remain symptomatic many partial nail avulsion when he returns from vacation. Epson salt soaks discussed. Monitor for any clinical signs or symptoms of infection and directed to call the office immediately should any occur  or go to the ER. -Follow-up as needed. Call the office with any questions, concerns, change in symptoms or symptoms do not resolve.  Celesta Gentile, DPM

## 2016-01-11 DIAGNOSIS — L570 Actinic keratosis: Secondary | ICD-10-CM | POA: Diagnosis not present

## 2016-02-24 DIAGNOSIS — M4806 Spinal stenosis, lumbar region: Secondary | ICD-10-CM | POA: Diagnosis not present

## 2016-02-24 DIAGNOSIS — M549 Dorsalgia, unspecified: Secondary | ICD-10-CM | POA: Diagnosis not present

## 2016-02-24 DIAGNOSIS — Z6837 Body mass index (BMI) 37.0-37.9, adult: Secondary | ICD-10-CM | POA: Diagnosis not present

## 2016-02-24 DIAGNOSIS — N50819 Testicular pain, unspecified: Secondary | ICD-10-CM | POA: Diagnosis not present

## 2016-02-29 DIAGNOSIS — D485 Neoplasm of uncertain behavior of skin: Secondary | ICD-10-CM | POA: Diagnosis not present

## 2016-02-29 DIAGNOSIS — L281 Prurigo nodularis: Secondary | ICD-10-CM | POA: Diagnosis not present

## 2016-02-29 DIAGNOSIS — L820 Inflamed seborrheic keratosis: Secondary | ICD-10-CM | POA: Diagnosis not present

## 2016-03-01 DIAGNOSIS — M5416 Radiculopathy, lumbar region: Secondary | ICD-10-CM | POA: Diagnosis not present

## 2016-03-15 DIAGNOSIS — E7800 Pure hypercholesterolemia, unspecified: Secondary | ICD-10-CM | POA: Diagnosis not present

## 2016-03-15 DIAGNOSIS — Z1389 Encounter for screening for other disorder: Secondary | ICD-10-CM | POA: Diagnosis not present

## 2016-03-15 DIAGNOSIS — N401 Enlarged prostate with lower urinary tract symptoms: Secondary | ICD-10-CM | POA: Diagnosis not present

## 2016-03-15 DIAGNOSIS — R7302 Impaired glucose tolerance (oral): Secondary | ICD-10-CM | POA: Diagnosis not present

## 2016-03-15 DIAGNOSIS — I10 Essential (primary) hypertension: Secondary | ICD-10-CM | POA: Diagnosis not present

## 2016-03-15 DIAGNOSIS — Z6835 Body mass index (BMI) 35.0-35.9, adult: Secondary | ICD-10-CM | POA: Diagnosis not present

## 2016-03-15 DIAGNOSIS — E559 Vitamin D deficiency, unspecified: Secondary | ICD-10-CM | POA: Diagnosis not present

## 2016-03-15 DIAGNOSIS — R808 Other proteinuria: Secondary | ICD-10-CM | POA: Diagnosis not present

## 2016-03-15 DIAGNOSIS — E668 Other obesity: Secondary | ICD-10-CM | POA: Diagnosis not present

## 2016-03-15 DIAGNOSIS — M4806 Spinal stenosis, lumbar region: Secondary | ICD-10-CM | POA: Diagnosis not present

## 2016-03-21 DIAGNOSIS — L723 Sebaceous cyst: Secondary | ICD-10-CM | POA: Diagnosis not present

## 2016-03-21 DIAGNOSIS — C44329 Squamous cell carcinoma of skin of other parts of face: Secondary | ICD-10-CM | POA: Diagnosis not present

## 2016-05-05 DIAGNOSIS — C44329 Squamous cell carcinoma of skin of other parts of face: Secondary | ICD-10-CM | POA: Diagnosis not present

## 2016-05-24 DIAGNOSIS — M7582 Other shoulder lesions, left shoulder: Secondary | ICD-10-CM | POA: Diagnosis not present

## 2016-08-23 ENCOUNTER — Ambulatory Visit (INDEPENDENT_AMBULATORY_CARE_PROVIDER_SITE_OTHER): Payer: Medicare Other | Admitting: Family Medicine

## 2016-08-23 ENCOUNTER — Encounter: Payer: Self-pay | Admitting: Family Medicine

## 2016-08-23 VITALS — BP 110/62 | HR 86 | Temp 98.6°F | Ht 70.0 in | Wt 243.0 lb

## 2016-08-23 DIAGNOSIS — R5383 Other fatigue: Secondary | ICD-10-CM

## 2016-08-23 DIAGNOSIS — I10 Essential (primary) hypertension: Secondary | ICD-10-CM

## 2016-08-23 DIAGNOSIS — R351 Nocturia: Secondary | ICD-10-CM

## 2016-08-23 DIAGNOSIS — Z7189 Other specified counseling: Secondary | ICD-10-CM

## 2016-08-23 DIAGNOSIS — N400 Enlarged prostate without lower urinary tract symptoms: Secondary | ICD-10-CM

## 2016-08-23 NOTE — Progress Notes (Signed)
Pre visit review using our clinic review tool, if applicable. No additional management support is needed unless otherwise documented below in the visit note. 

## 2016-08-23 NOTE — Patient Instructions (Signed)
Go to the lab on the way out.  We'll contact you with your lab report. I'll await your records.   Take care.  Glad to see you.

## 2016-08-24 ENCOUNTER — Encounter: Payer: Self-pay | Admitting: Family Medicine

## 2016-08-24 DIAGNOSIS — R351 Nocturia: Secondary | ICD-10-CM | POA: Insufficient documentation

## 2016-08-24 DIAGNOSIS — N400 Enlarged prostate without lower urinary tract symptoms: Secondary | ICD-10-CM | POA: Insufficient documentation

## 2016-08-24 DIAGNOSIS — I10 Essential (primary) hypertension: Secondary | ICD-10-CM | POA: Insufficient documentation

## 2016-08-24 DIAGNOSIS — R5383 Other fatigue: Secondary | ICD-10-CM | POA: Insufficient documentation

## 2016-08-24 DIAGNOSIS — Z7189 Other specified counseling: Secondary | ICD-10-CM | POA: Insufficient documentation

## 2016-08-24 LAB — CBC WITH DIFFERENTIAL/PLATELET
Basophils Absolute: 0 10*3/uL (ref 0.0–0.1)
Basophils Relative: 0.5 % (ref 0.0–3.0)
Eosinophils Absolute: 0.2 10*3/uL (ref 0.0–0.7)
Eosinophils Relative: 2.3 % (ref 0.0–5.0)
HCT: 46.4 % (ref 39.0–52.0)
Hemoglobin: 15.8 g/dL (ref 13.0–17.0)
Lymphocytes Relative: 19.3 % (ref 12.0–46.0)
Lymphs Abs: 1.5 10*3/uL (ref 0.7–4.0)
MCHC: 33.9 g/dL (ref 30.0–36.0)
MCV: 95.3 fl (ref 78.0–100.0)
Monocytes Absolute: 0.7 10*3/uL (ref 0.1–1.0)
Monocytes Relative: 8.5 % (ref 3.0–12.0)
Neutro Abs: 5.3 10*3/uL (ref 1.4–7.7)
Neutrophils Relative %: 69.4 % (ref 43.0–77.0)
Platelets: 209 10*3/uL (ref 150.0–400.0)
RBC: 4.87 Mil/uL (ref 4.22–5.81)
RDW: 13 % (ref 11.5–15.5)
WBC: 7.7 10*3/uL (ref 4.0–10.5)

## 2016-08-24 LAB — COMPREHENSIVE METABOLIC PANEL
ALT: 21 U/L (ref 0–53)
AST: 25 U/L (ref 0–37)
Albumin: 4.2 g/dL (ref 3.5–5.2)
Alkaline Phosphatase: 44 U/L (ref 39–117)
BUN: 24 mg/dL — ABNORMAL HIGH (ref 6–23)
CO2: 27 mEq/L (ref 19–32)
Calcium: 9.2 mg/dL (ref 8.4–10.5)
Chloride: 100 mEq/L (ref 96–112)
Creatinine, Ser: 0.94 mg/dL (ref 0.40–1.50)
GFR: 82.58 mL/min (ref >60.00)
Glucose, Bld: 76 mg/dL (ref 70–99)
Potassium: 4.1 mEq/L (ref 3.5–5.1)
Sodium: 140 mEq/L (ref 135–145)
Total Bilirubin: 1 mg/dL (ref 0.2–1.2)
Total Protein: 7 g/dL (ref 6.0–8.3)

## 2016-08-24 LAB — TSH: TSH: 1.58 u[IU]/mL (ref 0.35–4.50)

## 2016-08-24 NOTE — Assessment & Plan Note (Signed)
>  30 minutes spent in face to face time with patient, >50% spent in counselling or coordination of care.  Unclear source. Broad differential discussed with patient. He could be that nocturia is affecting his sleep enough to make him tired during the day. He could have sleep apnea, but he does not have significant symptoms noted at night, especially since he has elevated head of his bed. Reasonable to check baseline labs. Unclear if his blood pressure medicine could be causing the fatigue. See notes on labs.  At this point still okay for outpatient follow-up. He agrees.

## 2016-08-24 NOTE — Assessment & Plan Note (Signed)
Advanced directive discussed with patient.  Wife designated if patient were incapacitated. ?

## 2016-08-24 NOTE — Progress Notes (Signed)
New patient. Establishing care.  Still on baseline meds. Not lightheaded. No chest pain no shortness of breath no leg edema. He has been frequently tired. He will wake up tired in the morning. He does have nocturia, at baseline. He nod off if he goes to a movie or play. He does snore some, but he has no known sleep apnea. His snoring is better since he got a new bed and he can elevate the head of his bed. His symptoms have gradually gotten worse the last few years, more noticeable in the last 6 months.  Advanced directive discussed with patient. Wife designated if patient were incapacitated.  Requesting records from previous outside M.D.  Meds, vitals, and allergies reviewed.   ROS: Per HPI unless specifically indicated in ROS section   GEN: nad, alert and oriented HEENT: mucous membranes moist NECK: supple w/o LA CV: rrr. PULM: ctab, no inc wob ABD: soft, +bs EXT: no edema No tmg on exam.

## 2016-08-25 ENCOUNTER — Other Ambulatory Visit: Payer: Self-pay | Admitting: Family Medicine

## 2016-08-25 MED ORDER — BENAZEPRIL-HYDROCHLOROTHIAZIDE 20-12.5 MG PO TABS: 0.5000 | ORAL_TABLET | Freq: Every day | ORAL | Status: DC

## 2016-08-30 DIAGNOSIS — Z125 Encounter for screening for malignant neoplasm of prostate: Secondary | ICD-10-CM | POA: Diagnosis not present

## 2016-08-30 DIAGNOSIS — E559 Vitamin D deficiency, unspecified: Secondary | ICD-10-CM | POA: Diagnosis not present

## 2016-08-30 DIAGNOSIS — E7800 Pure hypercholesterolemia, unspecified: Secondary | ICD-10-CM | POA: Diagnosis not present

## 2016-08-30 DIAGNOSIS — R7302 Impaired glucose tolerance (oral): Secondary | ICD-10-CM | POA: Diagnosis not present

## 2016-08-30 DIAGNOSIS — I10 Essential (primary) hypertension: Secondary | ICD-10-CM | POA: Diagnosis not present

## 2016-09-02 ENCOUNTER — Encounter: Payer: Self-pay | Admitting: Family Medicine

## 2016-09-02 DIAGNOSIS — R809 Proteinuria, unspecified: Secondary | ICD-10-CM | POA: Insufficient documentation

## 2016-09-02 DIAGNOSIS — M4800 Spinal stenosis, site unspecified: Secondary | ICD-10-CM | POA: Insufficient documentation

## 2016-09-02 DIAGNOSIS — E785 Hyperlipidemia, unspecified: Secondary | ICD-10-CM | POA: Insufficient documentation

## 2016-09-05 DIAGNOSIS — E668 Other obesity: Secondary | ICD-10-CM | POA: Diagnosis not present

## 2016-09-05 DIAGNOSIS — I10 Essential (primary) hypertension: Secondary | ICD-10-CM | POA: Diagnosis not present

## 2016-09-05 DIAGNOSIS — E559 Vitamin D deficiency, unspecified: Secondary | ICD-10-CM | POA: Diagnosis not present

## 2016-09-05 DIAGNOSIS — Z1389 Encounter for screening for other disorder: Secondary | ICD-10-CM | POA: Diagnosis not present

## 2016-09-05 DIAGNOSIS — C44320 Squamous cell carcinoma of skin of unspecified parts of face: Secondary | ICD-10-CM | POA: Diagnosis not present

## 2016-09-05 DIAGNOSIS — Z6834 Body mass index (BMI) 34.0-34.9, adult: Secondary | ICD-10-CM | POA: Diagnosis not present

## 2016-09-05 DIAGNOSIS — K449 Diaphragmatic hernia without obstruction or gangrene: Secondary | ICD-10-CM | POA: Diagnosis not present

## 2016-09-05 DIAGNOSIS — M169 Osteoarthritis of hip, unspecified: Secondary | ICD-10-CM | POA: Diagnosis not present

## 2016-09-05 DIAGNOSIS — Z0000 Encounter for general adult medical examination without abnormal findings: Secondary | ICD-10-CM | POA: Diagnosis not present

## 2016-09-05 DIAGNOSIS — J309 Allergic rhinitis, unspecified: Secondary | ICD-10-CM | POA: Diagnosis not present

## 2016-09-06 ENCOUNTER — Encounter: Payer: Self-pay | Admitting: Family Medicine

## 2016-09-06 LAB — GLUCOSE (CC13)
A1c: 5.9
ALT: 23
AST: 22 U/L
Cholesterol, Total: 169
Creatinine, Ser: 0.8
Glucose: 109
HDL Cholesterol: 53
LDL Cholesterol (Calc): 100
Prostate Specific Ag, Serum: 3.005
Triglycerides: 81

## 2016-11-03 DIAGNOSIS — J069 Acute upper respiratory infection, unspecified: Secondary | ICD-10-CM | POA: Diagnosis not present

## 2016-12-15 DIAGNOSIS — M25512 Pain in left shoulder: Secondary | ICD-10-CM | POA: Diagnosis not present

## 2016-12-29 DIAGNOSIS — M170 Bilateral primary osteoarthritis of knee: Secondary | ICD-10-CM | POA: Diagnosis not present

## 2016-12-29 DIAGNOSIS — M75112 Incomplete rotator cuff tear or rupture of left shoulder, not specified as traumatic: Secondary | ICD-10-CM | POA: Diagnosis not present

## 2017-01-05 DIAGNOSIS — M170 Bilateral primary osteoarthritis of knee: Secondary | ICD-10-CM | POA: Diagnosis not present

## 2017-01-12 DIAGNOSIS — M170 Bilateral primary osteoarthritis of knee: Secondary | ICD-10-CM | POA: Diagnosis not present

## 2017-01-19 DIAGNOSIS — L570 Actinic keratosis: Secondary | ICD-10-CM | POA: Diagnosis not present

## 2017-01-19 DIAGNOSIS — D492 Neoplasm of unspecified behavior of bone, soft tissue, and skin: Secondary | ICD-10-CM | POA: Diagnosis not present

## 2017-01-19 DIAGNOSIS — L820 Inflamed seborrheic keratosis: Secondary | ICD-10-CM | POA: Diagnosis not present

## 2017-01-19 DIAGNOSIS — D0461 Carcinoma in situ of skin of right upper limb, including shoulder: Secondary | ICD-10-CM | POA: Diagnosis not present

## 2017-02-22 DIAGNOSIS — L570 Actinic keratosis: Secondary | ICD-10-CM | POA: Diagnosis not present

## 2017-02-27 DIAGNOSIS — M48061 Spinal stenosis, lumbar region without neurogenic claudication: Secondary | ICD-10-CM | POA: Diagnosis not present

## 2017-02-27 DIAGNOSIS — R7302 Impaired glucose tolerance (oral): Secondary | ICD-10-CM | POA: Diagnosis not present

## 2017-02-27 DIAGNOSIS — J3089 Other allergic rhinitis: Secondary | ICD-10-CM | POA: Diagnosis not present

## 2017-02-27 DIAGNOSIS — E7800 Pure hypercholesterolemia, unspecified: Secondary | ICD-10-CM | POA: Diagnosis not present

## 2017-05-02 DIAGNOSIS — L570 Actinic keratosis: Secondary | ICD-10-CM | POA: Diagnosis not present

## 2017-05-29 DIAGNOSIS — M25512 Pain in left shoulder: Secondary | ICD-10-CM | POA: Diagnosis not present

## 2017-06-05 DIAGNOSIS — M25512 Pain in left shoulder: Secondary | ICD-10-CM | POA: Diagnosis not present

## 2017-06-12 DIAGNOSIS — M25512 Pain in left shoulder: Secondary | ICD-10-CM | POA: Diagnosis not present

## 2017-08-31 DIAGNOSIS — N390 Urinary tract infection, site not specified: Secondary | ICD-10-CM | POA: Diagnosis not present

## 2017-08-31 DIAGNOSIS — E7800 Pure hypercholesterolemia, unspecified: Secondary | ICD-10-CM | POA: Diagnosis not present

## 2017-08-31 DIAGNOSIS — Z125 Encounter for screening for malignant neoplasm of prostate: Secondary | ICD-10-CM | POA: Diagnosis not present

## 2017-08-31 DIAGNOSIS — I10 Essential (primary) hypertension: Secondary | ICD-10-CM | POA: Diagnosis not present

## 2017-08-31 DIAGNOSIS — E559 Vitamin D deficiency, unspecified: Secondary | ICD-10-CM | POA: Diagnosis not present

## 2017-09-07 DIAGNOSIS — Z23 Encounter for immunization: Secondary | ICD-10-CM | POA: Diagnosis not present

## 2017-09-07 DIAGNOSIS — M48061 Spinal stenosis, lumbar region without neurogenic claudication: Secondary | ICD-10-CM | POA: Diagnosis not present

## 2017-09-07 DIAGNOSIS — Z0000 Encounter for general adult medical examination without abnormal findings: Secondary | ICD-10-CM | POA: Diagnosis not present

## 2017-09-07 DIAGNOSIS — R7302 Impaired glucose tolerance (oral): Secondary | ICD-10-CM | POA: Diagnosis not present

## 2017-09-07 DIAGNOSIS — E7800 Pure hypercholesterolemia, unspecified: Secondary | ICD-10-CM | POA: Diagnosis not present

## 2017-09-14 DIAGNOSIS — Z6835 Body mass index (BMI) 35.0-35.9, adult: Secondary | ICD-10-CM | POA: Diagnosis not present

## 2017-09-14 DIAGNOSIS — H6121 Impacted cerumen, right ear: Secondary | ICD-10-CM | POA: Diagnosis not present

## 2017-10-02 DIAGNOSIS — H903 Sensorineural hearing loss, bilateral: Secondary | ICD-10-CM | POA: Diagnosis not present

## 2017-10-02 DIAGNOSIS — H6121 Impacted cerumen, right ear: Secondary | ICD-10-CM | POA: Diagnosis not present

## 2017-10-02 DIAGNOSIS — L821 Other seborrheic keratosis: Secondary | ICD-10-CM | POA: Diagnosis not present

## 2017-10-02 DIAGNOSIS — L300 Nummular dermatitis: Secondary | ICD-10-CM | POA: Diagnosis not present

## 2018-01-01 ENCOUNTER — Encounter: Payer: Self-pay | Admitting: Podiatry

## 2018-01-01 ENCOUNTER — Ambulatory Visit (INDEPENDENT_AMBULATORY_CARE_PROVIDER_SITE_OTHER): Payer: Medicare Other | Admitting: Podiatry

## 2018-01-01 DIAGNOSIS — M79676 Pain in unspecified toe(s): Secondary | ICD-10-CM | POA: Diagnosis not present

## 2018-01-01 DIAGNOSIS — B351 Tinea unguium: Secondary | ICD-10-CM

## 2018-01-01 NOTE — Progress Notes (Signed)
He presents today chief complaint of a painful toenail hallux right.  He states that he just needs to be cut just a little bit and trimmed back.  He states that he does not want removed.  Objective: Vital signs are stable he is alert and oriented x3.  Pulses are palpable.  Hallux nail right is tender on palpation it is thickened and discolored with sharp incurvated nail margins.  Assessment: Probable onychomycosis with ingrown nail hallux right  Plan: Debrided hallux nail right follow-up with me as needed.

## 2018-03-20 DIAGNOSIS — L821 Other seborrheic keratosis: Secondary | ICD-10-CM | POA: Diagnosis not present

## 2018-03-20 DIAGNOSIS — D229 Melanocytic nevi, unspecified: Secondary | ICD-10-CM | POA: Diagnosis not present

## 2018-03-20 DIAGNOSIS — L820 Inflamed seborrheic keratosis: Secondary | ICD-10-CM | POA: Diagnosis not present

## 2018-03-20 DIAGNOSIS — D485 Neoplasm of uncertain behavior of skin: Secondary | ICD-10-CM | POA: Diagnosis not present

## 2018-04-25 DIAGNOSIS — M48061 Spinal stenosis, lumbar region without neurogenic claudication: Secondary | ICD-10-CM | POA: Diagnosis not present

## 2018-04-25 DIAGNOSIS — R7302 Impaired glucose tolerance (oral): Secondary | ICD-10-CM | POA: Diagnosis not present

## 2018-04-25 DIAGNOSIS — N401 Enlarged prostate with lower urinary tract symptoms: Secondary | ICD-10-CM | POA: Diagnosis not present

## 2018-04-25 DIAGNOSIS — K449 Diaphragmatic hernia without obstruction or gangrene: Secondary | ICD-10-CM | POA: Diagnosis not present

## 2018-05-19 DIAGNOSIS — M25562 Pain in left knee: Secondary | ICD-10-CM | POA: Diagnosis not present

## 2018-05-19 DIAGNOSIS — M25561 Pain in right knee: Secondary | ICD-10-CM | POA: Diagnosis not present

## 2018-05-21 DIAGNOSIS — Z96649 Presence of unspecified artificial hip joint: Secondary | ICD-10-CM | POA: Diagnosis not present

## 2018-05-21 DIAGNOSIS — M169 Osteoarthritis of hip, unspecified: Secondary | ICD-10-CM | POA: Diagnosis not present

## 2018-05-22 DIAGNOSIS — H903 Sensorineural hearing loss, bilateral: Secondary | ICD-10-CM | POA: Diagnosis not present

## 2018-06-20 DIAGNOSIS — L821 Other seborrheic keratosis: Secondary | ICD-10-CM | POA: Diagnosis not present

## 2018-06-20 DIAGNOSIS — L570 Actinic keratosis: Secondary | ICD-10-CM | POA: Diagnosis not present

## 2018-06-21 DIAGNOSIS — M48062 Spinal stenosis, lumbar region with neurogenic claudication: Secondary | ICD-10-CM | POA: Diagnosis not present

## 2018-06-26 ENCOUNTER — Other Ambulatory Visit: Payer: Self-pay | Admitting: Neurological Surgery

## 2018-06-26 DIAGNOSIS — M4800 Spinal stenosis, site unspecified: Secondary | ICD-10-CM

## 2018-06-28 ENCOUNTER — Ambulatory Visit
Admission: RE | Admit: 2018-06-28 | Discharge: 2018-06-28 | Disposition: A | Discharge status: Home / Self Care | Payer: Medicare Other | Source: Ambulatory Visit | Attending: Neurological Surgery | Admitting: Neurological Surgery

## 2018-06-28 DIAGNOSIS — R7302 Impaired glucose tolerance (oral): Secondary | ICD-10-CM | POA: Diagnosis not present

## 2018-06-28 DIAGNOSIS — N401 Enlarged prostate with lower urinary tract symptoms: Secondary | ICD-10-CM | POA: Diagnosis not present

## 2018-06-28 DIAGNOSIS — M48061 Spinal stenosis, lumbar region without neurogenic claudication: Secondary | ICD-10-CM | POA: Diagnosis not present

## 2018-06-28 DIAGNOSIS — E668 Other obesity: Secondary | ICD-10-CM | POA: Diagnosis not present

## 2018-06-28 DIAGNOSIS — M4800 Spinal stenosis, site unspecified: Secondary | ICD-10-CM

## 2018-07-11 DIAGNOSIS — Z6835 Body mass index (BMI) 35.0-35.9, adult: Secondary | ICD-10-CM | POA: Diagnosis not present

## 2018-07-11 DIAGNOSIS — I10 Essential (primary) hypertension: Secondary | ICD-10-CM | POA: Diagnosis not present

## 2018-07-11 DIAGNOSIS — M48062 Spinal stenosis, lumbar region with neurogenic claudication: Secondary | ICD-10-CM | POA: Diagnosis not present

## 2018-07-13 DIAGNOSIS — M5416 Radiculopathy, lumbar region: Secondary | ICD-10-CM | POA: Diagnosis not present

## 2018-07-16 ENCOUNTER — Other Ambulatory Visit: Payer: Self-pay | Admitting: Neurological Surgery

## 2018-07-18 DIAGNOSIS — M170 Bilateral primary osteoarthritis of knee: Secondary | ICD-10-CM | POA: Diagnosis not present

## 2018-07-25 DIAGNOSIS — M170 Bilateral primary osteoarthritis of knee: Secondary | ICD-10-CM | POA: Diagnosis not present

## 2018-08-01 DIAGNOSIS — M170 Bilateral primary osteoarthritis of knee: Secondary | ICD-10-CM | POA: Diagnosis not present

## 2018-09-04 ENCOUNTER — Institutional Professional Consult (permissible substitution): Payer: Medicare Other | Admitting: Neurology

## 2018-09-04 ENCOUNTER — Telehealth: Payer: Self-pay

## 2018-09-04 NOTE — Telephone Encounter (Signed)
Pt did not show for their appt with Dr. Athar today.  

## 2018-09-05 ENCOUNTER — Encounter: Payer: Self-pay | Admitting: Neurology

## 2018-09-06 DIAGNOSIS — E7800 Pure hypercholesterolemia, unspecified: Secondary | ICD-10-CM | POA: Diagnosis not present

## 2018-09-06 DIAGNOSIS — I10 Essential (primary) hypertension: Secondary | ICD-10-CM | POA: Diagnosis not present

## 2018-09-06 DIAGNOSIS — E559 Vitamin D deficiency, unspecified: Secondary | ICD-10-CM | POA: Diagnosis not present

## 2018-09-06 DIAGNOSIS — Z125 Encounter for screening for malignant neoplasm of prostate: Secondary | ICD-10-CM | POA: Diagnosis not present

## 2018-09-19 DIAGNOSIS — M48062 Spinal stenosis, lumbar region with neurogenic claudication: Secondary | ICD-10-CM | POA: Diagnosis not present

## 2018-09-20 DIAGNOSIS — Z1212 Encounter for screening for malignant neoplasm of rectum: Secondary | ICD-10-CM | POA: Diagnosis not present

## 2018-09-21 DIAGNOSIS — E7800 Pure hypercholesterolemia, unspecified: Secondary | ICD-10-CM | POA: Diagnosis not present

## 2018-09-21 DIAGNOSIS — Z0000 Encounter for general adult medical examination without abnormal findings: Secondary | ICD-10-CM | POA: Diagnosis not present

## 2018-09-21 DIAGNOSIS — I10 Essential (primary) hypertension: Secondary | ICD-10-CM | POA: Diagnosis not present

## 2018-09-21 DIAGNOSIS — R7302 Impaired glucose tolerance (oral): Secondary | ICD-10-CM | POA: Diagnosis not present

## 2018-09-21 DIAGNOSIS — Z23 Encounter for immunization: Secondary | ICD-10-CM | POA: Diagnosis not present

## 2018-10-02 DIAGNOSIS — M170 Bilateral primary osteoarthritis of knee: Secondary | ICD-10-CM | POA: Diagnosis not present

## 2018-10-13 DIAGNOSIS — M25561 Pain in right knee: Secondary | ICD-10-CM | POA: Diagnosis not present

## 2018-10-25 ENCOUNTER — Encounter: Payer: Self-pay | Admitting: Neurology

## 2018-10-25 ENCOUNTER — Ambulatory Visit (INDEPENDENT_AMBULATORY_CARE_PROVIDER_SITE_OTHER): Payer: Medicare Other | Admitting: Neurology

## 2018-10-25 VITALS — BP 139/82 | HR 79 | Ht 71.0 in | Wt 245.0 lb

## 2018-10-25 DIAGNOSIS — Z820 Family history of epilepsy and other diseases of the nervous system: Secondary | ICD-10-CM

## 2018-10-25 DIAGNOSIS — E669 Obesity, unspecified: Secondary | ICD-10-CM

## 2018-10-25 DIAGNOSIS — R0681 Apnea, not elsewhere classified: Secondary | ICD-10-CM

## 2018-10-25 DIAGNOSIS — R0683 Snoring: Secondary | ICD-10-CM | POA: Diagnosis not present

## 2018-10-25 DIAGNOSIS — Z9189 Other specified personal risk factors, not elsewhere classified: Secondary | ICD-10-CM

## 2018-10-25 DIAGNOSIS — G4719 Other hypersomnia: Secondary | ICD-10-CM | POA: Diagnosis not present

## 2018-10-25 DIAGNOSIS — R351 Nocturia: Secondary | ICD-10-CM

## 2018-10-25 NOTE — Progress Notes (Signed)
Subjective:    Patient ID: Todd Gonzales is a 77 y.o. male.  HPI     Star Age, MD, PhD [facility] [address] [place], [place] [address]  Dear Dr. Osborne Casco,   I saw your patient, Todd Gonzales, upon your kind request in the sleep clinic today for initial consultation of his sleep disorder, in particular, concern for underlying obstructive sleep apnea. The patient is accompanied by his wife today. Of note, he missed an appointment on 09/04/2018. As you know, Mr. Rupe is a 77 year old right-handed gentleman with an underlying medical history of hypertension, hyperlipidemia, BPH, arthritis, spinal stenosis, status post back surgery some 9 years ago, and obesity, who reports snoring and excessive daytime somnolence. I reviewed your office note from 06/28/2018, which you kindly included. He is scheduled for back surgery under Dr. Ellene Route later this year. He had sleep study testing several years ago but did not pursue CPAP therapy at the time. Prior sleep study results are not available for my review today. He reports that at the time of his previous sleep study he could not sleep. He reports sleep disruption and daytime sleepiness, poor quality sleep. Upper sleepiness score is 12 out of 24 today, fatigue score is 31 out of 63. He is married and lives with his wife, he is retired. They have 2 children. He is a nonsmoker and drinks alcohol occasionally, caffeine in the form of coffee, 1 cup per day on average. He had sleep study testing maybe 5 or 6 years ago. His wife reports that he could not sleep at all. He has great apprehension about coming in for a sleep study and would favor a home sleep test. She has witnessed pauses in his breathing while he is asleep and gasping sounds. His snoring is not very loud typically. He is does nap during the day. He has significant nocturia about 3-4 times per average night, denies morning headaches, denies telltale symptoms  of restless leg syndrome. His younger brother who passed away at the age of 48 from cancer had sleep apnea and was on a CPAP machine. Patient would be willing to try CPAP.   His Past Medical History Is Significant For: Past Medical History:  Diagnosis Date  . Arthritis   . BPH (benign prostatic hyperplasia)    nocturia x4 at baseline  . H/O hiatal hernia   . Hemorrhoids   . Hyperlipidemia   . Hypertension    Dr Osborne Casco- LOV with clearance 3/13 on chart  . IGT (impaired glucose tolerance)    last AIC 5.5- diet controlled- per office note 3/13  . Spinal stenosis   . UTI (lower urinary tract infection)    required admission to [facility]    His Past Surgical History Is Significant For: Past Surgical History:  Procedure Laterality Date  . BACK SURGERY  2008   Laminectomy L4-5  . JOINT REPLACEMENT Bilateral   . KNEE ARTHROSCOPY     right  . TOTAL HIP ARTHROPLASTY  03/20/2012   Procedure: TOTAL HIP ARTHROPLASTY ANTERIOR APPROACH;  Surgeon: Mauri Pole, MD;  Location: [facility];  Service: Orthopedics;  Laterality: Left;    His Family History Is Significant For: Family History  Problem Relation Age of Onset  . Arthritis Mother   . Lung cancer Father   . Asthma Father   . Colon cancer Neg Hx   . Prostate cancer Neg Hx     His Social History Is Significant For: Social  History   Socioeconomic History  . Marital status: Married    Spouse name: Not on file  . Number of children: Not on file  . Years of education: Not on file  . Highest education level: Not on file  Occupational History  . Not on file  Social Needs  . Financial resource strain: Not on file  . Food insecurity:    Worry: Not on file    Inability: Not on file  . Transportation needs:    Medical: Not on file    Non-medical: Not on file  Tobacco Use  . Smoking status: Never Smoker  . Smokeless tobacco: Never Used  Substance and Sexual Activity  . Alcohol use: Yes    Comment: socially, wine  . Drug use: No  .  Sexual activity: Not on file  Lifestyle  . Physical activity:    Days per week: Not on file    Minutes per session: Not on file  . Stress: Not on file  Relationships  . Social connections:    Talks on phone: Not on file    Gets together: Not on file    Attends religious service: Not on file    Active member of club or organization: Not on file    Attends meetings of clubs or organizations: Not on file    Relationship status: Not on file  Other Topics Concern  . Not on file  Social History Narrative   Enjoys golf   Plays bridge   Exercises at [employer].     2 kids, one local.     [employer] grad.     Married 55+ years    His Allergies Are:  Allergies  Allergen Reactions  . Codeine Other (See Comments)    Can't remember.  :   His Current Medications Are:  Outpatient Encounter Medications as of 10/25/2018  Medication Sig  . amoxicillin (AMOXIL) 500 MG tablet Take 500 mg by mouth. As needed for dental work.  . benazepril-hydrochlorthiazide (LOTENSIN HCT) 20-12.5 MG tablet Take 0.5 tablets by mouth daily after breakfast.  . celecoxib (CELEBREX) 200 MG capsule Take 200 mg by mouth daily.  . Cholecalciferol (VITAMIN D-1000 MAX ST) 1000 UNITS tablet Take 1,000 Units by mouth.  . cyanocobalamin 1000 MCG tablet Take 1,000 mcg by mouth daily.  . diclofenac sodium (VOLTAREN) 1 % GEL Apply topically 4 (four) times daily.  Marland Kitchen gabapentin (NEURONTIN) 100 MG capsule Take 100 mg by mouth 2 (two) times daily.   Marland Kitchen levocetirizine (XYZAL) 5 MG tablet Take 5 mg by mouth every evening.  . Menthol, Topical Analgesic, (BIOFREEZE) 10 % LIQD Apply 1 application topically 2 (two) times daily as needed. Pain   . omeprazole (PRILOSEC) 20 MG capsule Take 20 mg by mouth daily.  Marland Kitchen OVER THE COUNTER MEDICATION Protegra Antioxidant  . oxymetazoline (AFRIN) 0.05 % nasal spray Place 2 sprays into the nose 2 (two) times daily as needed. Allergies   . sodium chloride (OCEAN) 0.65 % nasal spray Place 1 spray into the  nose as needed.  . tamsulosin (FLOMAX) 0.4 MG CAPS capsule Take 0.4 mg by mouth.  . vitamin E 400 UNIT capsule Take 400 Units by mouth.   No facility-administered encounter medications on file as of 10/25/2018.   :  Review of Systems:  Out of a complete 14 point review of systems, all are reviewed and negative with the exception of these symptoms as listed below: Review of Systems  Neurological:  Pt presents today to discuss his sleep. Pt has had a sleep study in the past but could not sleep. Pt has never been on a cpap. Pt does endorse snoring.  Epworth Sleepiness Scale 0= would never doze 1= slight chance of dozing 2= moderate chance of dozing 3= high chance of dozing  Sitting and reading: 2 Watching TV: 2 Sitting inactive in a public place (ex. Theater or meeting): 2 As a passenger in a car for an hour without a break: 1 Lying down to rest in the afternoon: 2 Sitting and talking to someone: 0 Sitting quietly after lunch (no alcohol): 2 In a car, while stopped in traffic: 1 Total: 12     Objective:  Neurological Exam  Physical Exam Physical Examination:   Vitals:   10/25/18 1538  BP: 139/82  Pulse: 79    General Examination: The patient is a very pleasant 77 y.o. male in no acute distress. He appears well-developed and well-nourished and well groomed.   HEENT: Normocephalic, atraumatic, pupils are equal, round and reactive to light and accommodation. Extraocular tracking is good without limitation to gaze excursion or nystagmus noted. Normal smooth pursuit is noted. Hearing is grossly intact. Face is symmetric with normal facial animation and normal facial sensation. Speech is clear with no dysarthria noted. There is no hypophonia. There is no lip, neck/head, jaw or voice tremor. Neck is supple with full range of passive and active motion. Oropharynx exam reveals: mild mouth dryness, adequate dental hygiene and moderate airway crowding, due to smaller airway entry  and redundant soft palate, tonsils are small, Mallampati class III, neck circumference is 19-1/4 inches. He has a minimal overbite. Tongue protrudes centrally and palate elevates symmetrically.  Chest: Clear to auscultation without wheezing, rhonchi or crackles noted.  Heart: S1+S2+0, regular and normal without murmurs, rubs or gallops noted.   Abdomen: Soft, non-tender and non-distended with normal bowel sounds appreciated on auscultation.  Extremities: There is no pitting edema in the distal lower extremities bilaterally.   Skin: Warm and dry without trophic changes noted, dry patches, patchy redness in face.  Musculoskeletal: exam reveals no obvious joint deformities, tenderness or joint swelling or erythema.   Neurologically:  Mental status: The patient is awake, alert and oriented in all 4 spheres. Her immediate and remote memory, attention, language skills and fund of knowledge are appropriate. There is no evidence of aphasia, agnosia, apraxia or anomia. Speech is clear with normal prosody and enunciation. Thought process is linear. Mood is normal and affect is normal.  Cranial nerves II - XII are as described above under HEENT exam. In addition: shoulder shrug is normal with equal shoulder height noted. Motor exam: Normal bulk, strength and tone is noted. There is no drift, tremor or rebound. Romberg is not tested due to safety concerns. Fine motor skills and coordination: intact with normal finger taps, normal hand movements, normal rapid alternating patting, normal foot taps and normal foot agility.  Cerebellar testing: No dysmetria or intention tremor on finger to nose testing. Heel to shin is unremarkable bilaterally. There is no truncal or gait ataxia.  Sensory exam: intact to light touch in the upper and lower extremities.  Gait, station and balance: He stands with difficulty and has to push himself up. Posture is moderately stooped. He walks slowly and cautiously, no walking  aid.  Assessment and Plan:  In summary, SURAFEL HILLEARY is a very pleasant 77 y.o.-year old male with an underlying medical history of hypertension, hyperlipidemia, BPH, arthritis,  spinal stenosis, status post back surgery some 9 years ago, and obesity, whose history and physical exam are concerning for obstructive sleep apnea (OSA).  I had a long chat with the patient and his wife about my findings and the diagnosis of OSA, its prognosis and treatment options. We talked about medical treatments, surgical interventions and non-pharmacological approaches. I explained in particular the risks and ramifications of untreated moderate to severe OSA, especially with respect to developing cardiovascular disease down the Road, including congestive heart failure, difficult to treat hypertension, cardiac arrhythmias, or stroke. Even type 2 diabetes has, in part, been linked to untreated OSA. Symptoms of untreated OSA include daytime sleepiness, memory problems, mood irritability and mood disorder such as depression and anxiety, lack of energy, as well as recurrent headaches, especially morning headaches. We talked about trying to maintain a healthy lifestyle in general, as well as the importance of weight control. I encouraged the patient to eat healthy, exercise daily and keep well hydrated, to keep a scheduled bedtime and wake time routine, to not skip any meals and eat healthy snacks in between meals. I advised the patient not to drive when feeling sleepy.  I recommended the following at this time: home sleep testing.  I explained the sleep test procedure to the patient and also outlined possible surgical and non-surgical treatment options of OSA, including the use of a custom-made dental device (which would require a referral to a specialist dentist or oral surgeon), upper airway surgical options, such as pillar implants, radiofrequency surgery, tongue base surgery, and UPPP (which would involve a referral to an ENT  surgeon). Rarely, jaw surgery such as mandibular advancement may be considered.  I also explained the CPAP treatment option to the patient, who indicated that he would be willing to try CPAP if the need arises. I explained the importance of being compliant with PAP treatment, not only for insurance purposes but primarily to improve His symptoms, and for the patient's long term health benefit, including to reduce His cardiovascular risks. I answered all their questions today and the patient and his wife were in agreement. I would like to see him back after the sleep study is completed and encouraged him to call with any interim questions, concerns, problems or updates.   Thank you very much for allowing me to participate in the care of this nice patient. If I can be of any further assistance to you please do not hesitate to call me at [phone].  Sincerely,   Star Age, MD, PhD

## 2018-10-25 NOTE — Patient Instructions (Addendum)
Thank you for choosing [facility] for your sleep related care! It was nice to meet you today! I appreciate that you entrust me with your sleep related healthcare concerns. I hope, I was able to address at least some of your concerns today, and that I can help you feel reassured and also get better.    Here is what we discussed today and what we came up with as our plan for you:    Based on your symptoms and your exam I believe you are at risk for obstructive sleep apnea (aka OSA), and I think we should proceed with a sleep study to determine whether you do or do not have OSA and how severe it is.   As discussed, we will proceed with a home sleep test, hopefully after you have recuperated from your upcoming back surgery in December. Please schedule this at her convenience after the sleep lab calls you for pick up of your equipment for home testing.  Even, if you have mild OSA, I may want you to consider treatment with CPAP, as treatment of even borderline or mild sleep apnea can result and improvement of symptoms such as sleep disruption, daytime sleepiness, nighttime bathroom breaks, restless leg symptoms, improvement of headache syndromes, even improved mood disorder.   Please remember, the long-term risks and ramifications of untreated moderate to severe obstructive sleep apnea are: increased Cardiovascular disease, including congestive heart failure, stroke, difficult to control hypertension, treatment resistant obesity, arrhythmias, especially irregular heartbeat commonly known as A. Fib. (atrial fibrillation); even type 2 diabetes has been linked to untreated OSA.   Sleep apnea can cause disruption of sleep and sleep deprivation in most cases, which, in turn, can cause recurrent headaches, problems with memory, mood, concentration, focus, and vigilance. Most people with untreated sleep apnea report excessive daytime sleepiness, which can affect their ability to drive. Please do  not drive if you feel sleepy. Patients with sleep apnea developed difficulty initiating and maintaining sleep (aka insomnia).   Having sleep apnea may increase your risk for other sleep disorders, including involuntary behaviors sleep such as sleep terrors, sleep talking, sleepwalking.    Having sleep apnea can also increase your risk for restless leg syndrome and leg movements at night.   Please note that untreated obstructive sleep apnea may carry additional perioperative morbidity. Patients with significant obstructive sleep apnea (typically, in the moderate to severe degree) should receive, if possible, perioperative PAP (positive airway pressure) therapy and the surgeons and particularly the anesthesiologists should be informed of the diagnosis and the severity of the sleep disordered breathing.   I will likely see you back after your sleep study to go over the test results and where to go from there. We will call you after your sleep study to advise about the results (most likely, you will hear from [place], my nurse) and to set up an appointment at the time, as necessary.    Our sleep lab administrative assistant will call you to schedule your home sleep equipment pickup.

## 2018-11-06 DIAGNOSIS — L570 Actinic keratosis: Secondary | ICD-10-CM | POA: Diagnosis not present

## 2018-11-06 DIAGNOSIS — D485 Neoplasm of uncertain behavior of skin: Secondary | ICD-10-CM | POA: Diagnosis not present

## 2018-11-06 DIAGNOSIS — D0439 Carcinoma in situ of skin of other parts of face: Secondary | ICD-10-CM | POA: Diagnosis not present

## 2018-11-06 NOTE — Pre-Procedure Instructions (Signed)
KIAN OTTAVIANO  11/06/2018      [facility] - [place], [place] - [address], [address], [address] [place] [address] Phone: [phone] Fax: [phone]  [place], [place] [address] Phone: [phone] Fax: [phone]    Your procedure is scheduled on 11/20/18.  Report to [facility] Admitting at 530 A.M.  Call this number if you have problems the morning of surgery:  [phone]   Remember:  Do not eat or drink after midnight.  Y   Take these medicines the morning of surgery with A SIP OF WATER ---neurontin,prilosec,flomax    Do not wear jewelry, make-up or nail polish.  Do not wear lotions, powders, or perfumes, or deodorant.  Do not shave 48 hours prior to surgery.  Men may shave face and neck.  Do not bring valuables to the hospital.  [facility] is not responsible for any belongings or valuables.  Contacts, dentures or bridgework may not be worn into surgery.  Leave your suitcase in the car.  After surgery it may be brought to your room.  For patients admitted to the hospital, discharge time will be determined by your treatment team.  Patients discharged the day of surgery will not be allowed to drive home.   Name and phone number of your driver:   Do not take any aspirin,anti-inflammatories,vitamins,or herbal supplements 5-7 days prior to surgery. Special instructions:  [place] - Preparing for Surgery  Before surgery, you can play an important role.  Because skin is not sterile, your skin needs to be as free of germs as possible.  You can reduce the number of germs on you skin by washing with CHG (chlorahexidine gluconate) soap before surgery.  CHG is an antiseptic cleaner which kills germs and bonds with the skin to continue killing germs even after washing.  Oral Hygiene is also important in reducing the risk of infection.  Remember to brush  your teeth with your regular toothpaste the morning of surgery.  Please DO NOT use if you have an allergy to CHG or antibacterial soaps.  If your skin becomes reddened/irritated stop using the CHG and inform your nurse when you arrive at Short Stay.  Do not shave (including legs and underarms) for at least 48 hours prior to the first CHG shower.  You may shave your face.  Please follow these instructions carefully:   1.  Shower with CHG Soap the night before surgery and the morning of Surgery.  2.  If you choose to wash your hair, wash your hair first as usual with your normal shampoo.  3.  After you shampoo, rinse your hair and body thoroughly to remove the shampoo. 4.  Use CHG as you would any other liquid soap.  You can apply chg directly to the skin and wash gently with a      scrungie or washcloth.           5.  Apply the CHG Soap to your body ONLY FROM THE NECK DOWN.   Do not use on open wounds or open sores. Avoid contact with your eyes, ears, mouth and genitals (private parts).  Wash genitals (private parts) with your normal soap.  6.  Wash thoroughly, paying special attention to the area where your surgery will be performed.  7.  Thoroughly rinse your body with warm water from the neck down.  8.  DO NOT shower/wash with your normal soap after using and rinsing off the CHG Soap.  9.  Pat yourself dry with a clean towel.            10.  Wear clean pajamas.            11.  Place clean sheets on your bed the night of your first shower and do not sleep with pets.  Day of Surgery  Do not apply any lotions/deoderants the morning of surgery.   Please wear clean clothes to the hospital/surgery center. Remember to brush your teeth with toothpaste.    Please read over the following fact sheets that you were given. MRSA Information

## 2018-11-07 ENCOUNTER — Encounter (HOSPITAL_COMMUNITY)
Admission: RE | Admit: 2018-11-07 | Discharge: 2018-11-07 | Disposition: A | Discharge status: Home / Self Care | Payer: Medicare Other | Source: Ambulatory Visit | Attending: Neurological Surgery | Admitting: Neurological Surgery

## 2018-11-07 ENCOUNTER — Encounter (HOSPITAL_COMMUNITY): Payer: Self-pay

## 2018-11-07 DIAGNOSIS — Z01818 Encounter for other preprocedural examination: Secondary | ICD-10-CM | POA: Diagnosis not present

## 2018-11-07 HISTORY — DX: Other seasonal allergic rhinitis: J30.2

## 2018-11-07 LAB — BASIC METABOLIC PANEL
Anion gap: 11 (ref 5–15)
BUN: 13 mg/dL (ref 8–23)
CO2: 25 mmol/L (ref 22–32)
Calcium: 8.6 mg/dL — ABNORMAL LOW (ref 8.9–10.3)
Chloride: 105 mmol/L (ref 98–111)
Creatinine, Ser: 0.8 mg/dL (ref 0.61–1.24)
GFR calc Af Amer: >60 mL/min (ref >60)
GFR calc non Af Amer: >60 mL/min (ref >60)
Glucose, Bld: 120 mg/dL — ABNORMAL HIGH (ref 70–99)
Potassium: 3.5 mmol/L (ref 3.5–5.1)
Sodium: 141 mmol/L (ref 135–145)

## 2018-11-07 LAB — SURGICAL PCR SCREEN
MRSA, PCR: NEGATIVE
Staphylococcus aureus: NEGATIVE

## 2018-11-07 LAB — CBC
HCT: 47.4 % (ref 39.0–52.0)
Hemoglobin: 14.8 g/dL (ref 13.0–17.0)
MCH: 31.2 pg (ref 26.0–34.0)
MCHC: 31.2 g/dL (ref 30.0–36.0)
MCV: 99.8 fL (ref 80.0–100.0)
Platelets: 192 10*3/uL (ref 150–400)
RBC: 4.75 MIL/uL (ref 4.22–5.81)
RDW: 12.5 % (ref 11.5–15.5)
WBC: 5.5 10*3/uL (ref 4.0–10.5)
nRBC: 0 % (ref 0.0–0.2)

## 2018-11-07 MED ORDER — CHLORHEXIDINE GLUCONATE CLOTH 2 % EX PADS: 6.0000 | MEDICATED_PAD | Freq: Once | CUTANEOUS | Status: DC

## 2018-11-20 ENCOUNTER — Ambulatory Visit (HOSPITAL_COMMUNITY): Payer: Medicare Other

## 2018-11-20 ENCOUNTER — Encounter (HOSPITAL_COMMUNITY): Payer: Self-pay | Admitting: Surgery

## 2018-11-20 ENCOUNTER — Encounter (HOSPITAL_COMMUNITY)
Admission: RE | Disposition: A | Discharge status: Home / Self Care | Payer: Self-pay | Source: Ambulatory Visit | Attending: Neurological Surgery

## 2018-11-20 ENCOUNTER — Other Ambulatory Visit: Payer: Self-pay

## 2018-11-20 ENCOUNTER — Ambulatory Visit (HOSPITAL_COMMUNITY): Payer: Medicare Other | Admitting: Certified Registered Nurse Anesthetist

## 2018-11-20 ENCOUNTER — Observation Stay (HOSPITAL_COMMUNITY)
Admission: RE | Admit: 2018-11-20 | Discharge: 2018-11-22 | Disposition: A | Discharge status: Home / Self Care | Payer: Medicare Other | Source: Ambulatory Visit | Attending: Neurological Surgery | Admitting: Neurological Surgery

## 2018-11-20 DIAGNOSIS — R351 Nocturia: Secondary | ICD-10-CM | POA: Insufficient documentation

## 2018-11-20 DIAGNOSIS — M48062 Spinal stenosis, lumbar region with neurogenic claudication: Secondary | ICD-10-CM | POA: Diagnosis not present

## 2018-11-20 DIAGNOSIS — Z6833 Body mass index (BMI) 33.0-33.9, adult: Secondary | ICD-10-CM | POA: Insufficient documentation

## 2018-11-20 DIAGNOSIS — M4715 Other spondylosis with myelopathy, thoracolumbar region: Secondary | ICD-10-CM | POA: Insufficient documentation

## 2018-11-20 DIAGNOSIS — Z9889 Other specified postprocedural states: Secondary | ICD-10-CM | POA: Insufficient documentation

## 2018-11-20 DIAGNOSIS — N401 Enlarged prostate with lower urinary tract symptoms: Secondary | ICD-10-CM | POA: Insufficient documentation

## 2018-11-20 DIAGNOSIS — I10 Essential (primary) hypertension: Secondary | ICD-10-CM | POA: Insufficient documentation

## 2018-11-20 DIAGNOSIS — M48061 Spinal stenosis, lumbar region without neurogenic claudication: Secondary | ICD-10-CM

## 2018-11-20 DIAGNOSIS — Z96642 Presence of left artificial hip joint: Secondary | ICD-10-CM | POA: Diagnosis not present

## 2018-11-20 DIAGNOSIS — Z79899 Other long term (current) drug therapy: Secondary | ICD-10-CM | POA: Diagnosis not present

## 2018-11-20 DIAGNOSIS — M1990 Unspecified osteoarthritis, unspecified site: Secondary | ICD-10-CM | POA: Diagnosis not present

## 2018-11-20 DIAGNOSIS — K449 Diaphragmatic hernia without obstruction or gangrene: Secondary | ICD-10-CM | POA: Diagnosis not present

## 2018-11-20 DIAGNOSIS — Z419 Encounter for procedure for purposes other than remedying health state, unspecified: Secondary | ICD-10-CM

## 2018-11-20 DIAGNOSIS — E785 Hyperlipidemia, unspecified: Secondary | ICD-10-CM | POA: Diagnosis not present

## 2018-11-20 DIAGNOSIS — Z885 Allergy status to narcotic agent status: Secondary | ICD-10-CM | POA: Diagnosis not present

## 2018-11-20 DIAGNOSIS — M4805 Spinal stenosis, thoracolumbar region: Principal | ICD-10-CM | POA: Insufficient documentation

## 2018-11-20 DIAGNOSIS — G992 Myelopathy in diseases classified elsewhere: Secondary | ICD-10-CM | POA: Diagnosis present

## 2018-11-20 DIAGNOSIS — Z791 Long term (current) use of non-steroidal anti-inflammatories (NSAID): Secondary | ICD-10-CM | POA: Insufficient documentation

## 2018-11-20 DIAGNOSIS — M545 Low back pain: Secondary | ICD-10-CM | POA: Diagnosis not present

## 2018-11-20 HISTORY — PX: LUMBAR LAMINECTOMY/DECOMPRESSION MICRODISCECTOMY: SHX5026

## 2018-11-20 SURGERY — LUMBAR LAMINECTOMY/DECOMPRESSION MICRODISCECTOMY 1 LEVEL
Anesthesia: General | Site: Spine Lumbar | Laterality: Bilateral

## 2018-11-20 MED ORDER — ALUM & MAG HYDROXIDE-SIMETH 200-200-20 MG/5ML PO SUSP: 30.0000 mL | Freq: Four times a day (QID) | ORAL | Status: DC | PRN

## 2018-11-20 MED ORDER — SODIUM CHLORIDE 0.9% FLUSH: 3.0000 mL | INTRAVENOUS | Status: DC | PRN

## 2018-11-20 MED ORDER — DOCUSATE SODIUM 100 MG PO CAPS
100.0000 mg | ORAL_CAPSULE | Freq: Two times a day (BID) | ORAL | Status: DC
  Administered 2018-11-20 – 2018-11-22 (×5): 100 mg via ORAL
  Filled 2018-11-20 (×5): qty 1

## 2018-11-20 MED ORDER — BISACODYL 10 MG RE SUPP: 10.0000 mg | Freq: Every day | RECTAL | Status: DC | PRN

## 2018-11-20 MED ORDER — OXYMETAZOLINE HCL 0.05 % NA SOLN
2.0000 | Freq: Two times a day (BID) | NASAL | Status: DC | PRN
  Filled 2018-11-20: qty 15

## 2018-11-20 MED ORDER — THROMBIN 5000 UNITS EX SOLR
OROMUCOSAL | Status: DC | PRN
  Administered 2018-11-20: 5 mL via TOPICAL

## 2018-11-20 MED ORDER — SALINE SPRAY 0.65 % NA SOLN
1.0000 | NASAL | Status: DC | PRN
  Filled 2018-11-20: qty 44

## 2018-11-20 MED ORDER — DEXAMETHASONE SODIUM PHOSPHATE 10 MG/ML IJ SOLN
INTRAMUSCULAR | Status: DC | PRN
  Administered 2018-11-20: 10 mg via INTRAVENOUS

## 2018-11-20 MED ORDER — ACETAMINOPHEN 500 MG PO TABS: 1000.0000 mg | ORAL_TABLET | Freq: Once | ORAL | Status: DC | PRN

## 2018-11-20 MED ORDER — BENAZEPRIL-HYDROCHLOROTHIAZIDE 20-12.5 MG PO TABS: 0.5000 | ORAL_TABLET | Freq: Every day | ORAL | Status: DC

## 2018-11-20 MED ORDER — LIDOCAINE 2% (20 MG/ML) 5 ML SYRINGE
INTRAMUSCULAR | Status: AC
  Filled 2018-11-20: qty 5

## 2018-11-20 MED ORDER — SUGAMMADEX SODIUM 200 MG/2ML IV SOLN
INTRAVENOUS | Status: DC | PRN
  Administered 2018-11-20: 218.6 mg via INTRAVENOUS

## 2018-11-20 MED ORDER — 0.9 % SODIUM CHLORIDE (POUR BTL) OPTIME
TOPICAL | Status: DC | PRN
  Administered 2018-11-20: 1000 mL

## 2018-11-20 MED ORDER — PHENYLEPHRINE HCL 10 MG/ML IJ SOLN: INTRAMUSCULAR | Status: DC | PRN

## 2018-11-20 MED ORDER — ROCURONIUM BROMIDE 50 MG/5ML IV SOSY
PREFILLED_SYRINGE | INTRAVENOUS | Status: AC
  Filled 2018-11-20: qty 5

## 2018-11-20 MED ORDER — METHOCARBAMOL 500 MG PO TABS
500.0000 mg | ORAL_TABLET | Freq: Four times a day (QID) | ORAL | Status: DC | PRN
  Administered 2018-11-21 – 2018-11-22 (×4): 500 mg via ORAL
  Filled 2018-11-20 (×4): qty 1

## 2018-11-20 MED ORDER — METHOCARBAMOL 1000 MG/10ML IJ SOLN
500.0000 mg | Freq: Four times a day (QID) | INTRAVENOUS | Status: DC | PRN
  Filled 2018-11-20: qty 5

## 2018-11-20 MED ORDER — SENNA 8.6 MG PO TABS
1.0000 | ORAL_TABLET | Freq: Two times a day (BID) | ORAL | Status: DC
  Administered 2018-11-20 – 2018-11-22 (×5): 8.6 mg via ORAL
  Filled 2018-11-20 (×5): qty 1

## 2018-11-20 MED ORDER — CEFAZOLIN SODIUM-DEXTROSE 2-4 GM/100ML-% IV SOLN
2.0000 g | Freq: Three times a day (TID) | INTRAVENOUS | Status: AC
  Administered 2018-11-20 (×2): 2 g via INTRAVENOUS
  Filled 2018-11-20 (×2): qty 100

## 2018-11-20 MED ORDER — ONDANSETRON HCL 4 MG/2ML IJ SOLN
INTRAMUSCULAR | Status: DC | PRN
  Administered 2018-11-20: 4 mg via INTRAVENOUS

## 2018-11-20 MED ORDER — PHENOL 1.4 % MT LIQD: 1.0000 | OROMUCOSAL | Status: DC | PRN

## 2018-11-20 MED ORDER — LACTATED RINGERS IV SOLN
INTRAVENOUS | Status: DC | PRN
  Administered 2018-11-20: 07:00:00 via INTRAVENOUS

## 2018-11-20 MED ORDER — ONDANSETRON HCL 4 MG/2ML IJ SOLN: 4.0000 mg | Freq: Four times a day (QID) | INTRAMUSCULAR | Status: DC | PRN

## 2018-11-20 MED ORDER — MENTHOL 3 MG MT LOZG: 1.0000 | LOZENGE | OROMUCOSAL | Status: DC | PRN

## 2018-11-20 MED ORDER — LIDOCAINE-EPINEPHRINE 1 %-1:100000 IJ SOLN
INTRAMUSCULAR | Status: DC | PRN
  Administered 2018-11-20: 4.5 mL

## 2018-11-20 MED ORDER — OXYCODONE HCL 5 MG/5ML PO SOLN: 5.0000 mg | Freq: Once | ORAL | Status: DC | PRN

## 2018-11-20 MED ORDER — PROPOFOL 10 MG/ML IV BOLUS
INTRAVENOUS | Status: DC | PRN
  Administered 2018-11-20: 90 mg via INTRAVENOUS
  Administered 2018-11-20: 50 mg via INTRAVENOUS
  Administered 2018-11-20: 20 mg via INTRAVENOUS

## 2018-11-20 MED ORDER — LIDOCAINE 2% (20 MG/ML) 5 ML SYRINGE
INTRAMUSCULAR | Status: DC | PRN
  Administered 2018-11-20: 60 mg via INTRAVENOUS

## 2018-11-20 MED ORDER — FENTANYL CITRATE (PF) 100 MCG/2ML IJ SOLN
INTRAMUSCULAR | Status: AC
  Filled 2018-11-20: qty 2

## 2018-11-20 MED ORDER — ACETAMINOPHEN 160 MG/5ML PO SOLN: 1000.0000 mg | Freq: Once | ORAL | Status: DC | PRN

## 2018-11-20 MED ORDER — OXYCODONE-ACETAMINOPHEN 5-325 MG PO TABS
1.0000 | ORAL_TABLET | ORAL | Status: DC | PRN
  Administered 2018-11-21 – 2018-11-22 (×7): 2 via ORAL
  Filled 2018-11-20 (×7): qty 2

## 2018-11-20 MED ORDER — LIDOCAINE-EPINEPHRINE 1 %-1:100000 IJ SOLN
INTRAMUSCULAR | Status: AC
  Filled 2018-11-20: qty 1

## 2018-11-20 MED ORDER — OXYCODONE HCL 5 MG PO TABS: 5.0000 mg | ORAL_TABLET | Freq: Once | ORAL | Status: DC | PRN

## 2018-11-20 MED ORDER — PHENYLEPHRINE 40 MCG/ML (10ML) SYRINGE FOR IV PUSH (FOR BLOOD PRESSURE SUPPORT)
PREFILLED_SYRINGE | INTRAVENOUS | Status: DC | PRN
  Administered 2018-11-20: 80 ug via INTRAVENOUS
  Administered 2018-11-20 (×3): 40 ug via INTRAVENOUS

## 2018-11-20 MED ORDER — FENTANYL CITRATE (PF) 100 MCG/2ML IJ SOLN
25.0000 ug | INTRAMUSCULAR | Status: DC | PRN
  Administered 2018-11-20 (×2): 50 ug via INTRAVENOUS
  Administered 2018-11-20 (×2): 25 ug via INTRAVENOUS

## 2018-11-20 MED ORDER — BENAZEPRIL HCL 20 MG PO TABS
20.0000 mg | ORAL_TABLET | Freq: Every day | ORAL | Status: DC
  Administered 2018-11-20 – 2018-11-22 (×3): 20 mg via ORAL
  Filled 2018-11-20 (×3): qty 1

## 2018-11-20 MED ORDER — HYDROCHLOROTHIAZIDE 12.5 MG PO CAPS
12.5000 mg | ORAL_CAPSULE | Freq: Every day | ORAL | Status: DC
  Administered 2018-11-20 – 2018-11-22 (×3): 12.5 mg via ORAL
  Filled 2018-11-20 (×3): qty 1

## 2018-11-20 MED ORDER — ONDANSETRON HCL 4 MG PO TABS: 4.0000 mg | ORAL_TABLET | Freq: Four times a day (QID) | ORAL | Status: DC | PRN

## 2018-11-20 MED ORDER — ROCURONIUM BROMIDE 10 MG/ML (PF) SYRINGE
PREFILLED_SYRINGE | INTRAVENOUS | Status: DC | PRN
  Administered 2018-11-20: 50 mg via INTRAVENOUS

## 2018-11-20 MED ORDER — KETOROLAC TROMETHAMINE 15 MG/ML IJ SOLN
7.5000 mg | Freq: Four times a day (QID) | INTRAMUSCULAR | Status: AC
  Administered 2018-11-20 – 2018-11-21 (×4): 7.5 mg via INTRAVENOUS
  Filled 2018-11-20 (×4): qty 1

## 2018-11-20 MED ORDER — BUPIVACAINE HCL (PF) 0.5 % IJ SOLN
INTRAMUSCULAR | Status: DC | PRN
  Administered 2018-11-20: 4.5 mL
  Administered 2018-11-20: 20 mL

## 2018-11-20 MED ORDER — SODIUM CHLORIDE 0.9 % IV SOLN
INTRAVENOUS | Status: DC | PRN
  Administered 2018-11-20: 500 mL

## 2018-11-20 MED ORDER — MORPHINE SULFATE (PF) 2 MG/ML IV SOLN
2.0000 mg | INTRAVENOUS | Status: DC | PRN
  Administered 2018-11-21: 2 mg via INTRAVENOUS
  Filled 2018-11-20 (×2): qty 1

## 2018-11-20 MED ORDER — ACETAMINOPHEN 650 MG RE SUPP: 650.0000 mg | RECTAL | Status: DC | PRN

## 2018-11-20 MED ORDER — SALINE NASAL SPRAY 0.65 % NA SOLN: 1.0000 | NASAL | Status: DC | PRN

## 2018-11-20 MED ORDER — BUPIVACAINE HCL (PF) 0.5 % IJ SOLN
INTRAMUSCULAR | Status: AC
  Filled 2018-11-20: qty 30

## 2018-11-20 MED ORDER — SODIUM CHLORIDE 0.9 % IV SOLN: 250.0000 mL | INTRAVENOUS | Status: DC

## 2018-11-20 MED ORDER — SODIUM CHLORIDE 0.9% FLUSH
3.0000 mL | Freq: Two times a day (BID) | INTRAVENOUS | Status: DC
  Administered 2018-11-20: 3 mL via INTRAVENOUS

## 2018-11-20 MED ORDER — ACETAMINOPHEN 10 MG/ML IV SOLN: 1000.0000 mg | Freq: Once | INTRAVENOUS | Status: DC | PRN

## 2018-11-20 MED ORDER — FENTANYL CITRATE (PF) 250 MCG/5ML IJ SOLN
INTRAMUSCULAR | Status: AC
  Filled 2018-11-20: qty 5

## 2018-11-20 MED ORDER — FENTANYL CITRATE (PF) 100 MCG/2ML IJ SOLN
INTRAMUSCULAR | Status: AC
  Administered 2018-11-20: 25 ug via INTRAVENOUS
  Filled 2018-11-20: qty 2

## 2018-11-20 MED ORDER — TAMSULOSIN HCL 0.4 MG PO CAPS
0.4000 mg | ORAL_CAPSULE | Freq: Every day | ORAL | Status: DC
  Administered 2018-11-20 – 2018-11-21 (×2): 0.4 mg via ORAL
  Filled 2018-11-20 (×2): qty 1

## 2018-11-20 MED ORDER — FLEET ENEMA 7-19 GM/118ML RE ENEM: 1.0000 | ENEMA | Freq: Once | RECTAL | Status: DC | PRN

## 2018-11-20 MED ORDER — ACETAMINOPHEN 325 MG PO TABS: 650.0000 mg | ORAL_TABLET | ORAL | Status: DC | PRN

## 2018-11-20 MED ORDER — CEFAZOLIN SODIUM-DEXTROSE 2-4 GM/100ML-% IV SOLN
2.0000 g | INTRAVENOUS | Status: AC
  Administered 2018-11-20: 2 g via INTRAVENOUS
  Filled 2018-11-20: qty 100

## 2018-11-20 MED ORDER — FENTANYL CITRATE (PF) 250 MCG/5ML IJ SOLN
INTRAMUSCULAR | Status: DC | PRN
  Administered 2018-11-20: 100 ug via INTRAVENOUS
  Administered 2018-11-20: 50 ug via INTRAVENOUS

## 2018-11-20 MED ORDER — POLYETHYLENE GLYCOL 3350 17 G PO PACK
17.0000 g | PACK | Freq: Every day | ORAL | Status: DC | PRN
  Administered 2018-11-22: 17 g via ORAL
  Filled 2018-11-20: qty 1

## 2018-11-20 MED ORDER — THROMBIN 5000 UNITS EX SOLR
CUTANEOUS | Status: AC
  Filled 2018-11-20: qty 15000

## 2018-11-20 MED ORDER — PROPOFOL 10 MG/ML IV BOLUS
INTRAVENOUS | Status: AC
  Filled 2018-11-20: qty 40

## 2018-11-20 MED ORDER — GABAPENTIN 100 MG PO CAPS
200.0000 mg | ORAL_CAPSULE | Freq: Every day | ORAL | Status: DC
  Administered 2018-11-20 – 2018-11-21 (×2): 200 mg via ORAL
  Filled 2018-11-20 (×2): qty 2

## 2018-11-20 SURGICAL SUPPLY — 48 items
ADH SKN CLS APL DERMABOND .7 (GAUZE/BANDAGES/DRESSINGS) ×1
BAG DECANTER FOR FLEXI CONT (MISCELLANEOUS) ×2 IMPLANT
BLADE CLIPPER SURG (BLADE) IMPLANT
BUR ACORN 6.0 (BURR) IMPLANT
BUR MATCHSTICK NEURO 3.0 LAGG (BURR) ×2 IMPLANT
CANISTER SUCT 3000ML PPV (MISCELLANEOUS) ×2 IMPLANT
COVER WAND RF STERILE (DRAPES) ×1 IMPLANT
DECANTER SPIKE VIAL GLASS SM (MISCELLANEOUS) ×3 IMPLANT
DERMABOND ADVANCED (GAUZE/BANDAGES/DRESSINGS) ×1
DERMABOND ADVANCED .7 DNX12 (GAUZE/BANDAGES/DRESSINGS) ×1 IMPLANT
DEVICE DISSECT PLASMABLAD 3.0S (MISCELLANEOUS) ×1 IMPLANT
DRAPE HALF SHEET 40X57 (DRAPES) IMPLANT
DRAPE LAPAROTOMY T 102X78X121 (DRAPES) ×2 IMPLANT
DRAPE MICROSCOPE LEICA (MISCELLANEOUS) IMPLANT
DURAPREP 26ML APPLICATOR (WOUND CARE) ×2 IMPLANT
ELECT REM PT RETURN 9FT ADLT (ELECTROSURGICAL) ×2
ELECTRODE REM PT RTRN 9FT ADLT (ELECTROSURGICAL) ×1 IMPLANT
GAUZE 4X4 16PLY RFD (DISPOSABLE) IMPLANT
GAUZE SPONGE 4X4 12PLY STRL (GAUZE/BANDAGES/DRESSINGS) ×1 IMPLANT
GLOVE BIOGEL PI IND STRL 8.5 (GLOVE) ×1 IMPLANT
GLOVE BIOGEL PI INDICATOR 8.5 (GLOVE) ×1
GLOVE ECLIPSE 8.5 STRL (GLOVE) ×2 IMPLANT
GLOVE SURG SS PI 7.5 STRL IVOR (GLOVE) ×4 IMPLANT
GOWN STRL REUS W/ TWL LRG LVL3 (GOWN DISPOSABLE) IMPLANT
GOWN STRL REUS W/ TWL XL LVL3 (GOWN DISPOSABLE) IMPLANT
GOWN STRL REUS W/TWL 2XL LVL3 (GOWN DISPOSABLE) ×2 IMPLANT
GOWN STRL REUS W/TWL LRG LVL3 (GOWN DISPOSABLE) ×4
GOWN STRL REUS W/TWL XL LVL3 (GOWN DISPOSABLE)
KIT BASIN OR (CUSTOM PROCEDURE TRAY) ×2 IMPLANT
KIT TURNOVER KIT B (KITS) ×2 IMPLANT
NDL SPNL 20GX3.5 QUINCKE YW (NEEDLE) IMPLANT
NEEDLE HYPO 22GX1.5 SAFETY (NEEDLE) ×2 IMPLANT
NEEDLE SPNL 20GX3.5 QUINCKE YW (NEEDLE) ×2 IMPLANT
NS IRRIG 1000ML POUR BTL (IV SOLUTION) ×2 IMPLANT
PACK LAMINECTOMY NEURO (CUSTOM PROCEDURE TRAY) ×2 IMPLANT
PAD ARMBOARD 7.5X6 YLW CONV (MISCELLANEOUS) ×8 IMPLANT
PATTIES SURGICAL .5 X1 (DISPOSABLE) ×2 IMPLANT
PLASMABLADE 3.0S (MISCELLANEOUS) ×2
RUBBERBAND STERILE (MISCELLANEOUS) ×2 IMPLANT
SPONGE SURGIFOAM ABS GEL SZ50 (HEMOSTASIS) ×2 IMPLANT
SUT PROLENE 6 0 BV (SUTURE) ×2 IMPLANT
SUT VIC AB 1 CT1 18XBRD ANBCTR (SUTURE) ×1 IMPLANT
SUT VIC AB 1 CT1 8-18 (SUTURE) ×2
SUT VIC AB 2-0 CP2 18 (SUTURE) ×3 IMPLANT
SUT VIC AB 3-0 SH 8-18 (SUTURE) ×2 IMPLANT
TOWEL GREEN STERILE (TOWEL DISPOSABLE) ×2 IMPLANT
TOWEL GREEN STERILE FF (TOWEL DISPOSABLE) ×2 IMPLANT
WATER STERILE IRR 1000ML POUR (IV SOLUTION) ×2 IMPLANT

## 2018-11-20 NOTE — H&P (Signed)
Todd Gonzales is an 77 y.o. male.   Chief Complaint: Back and bilateral leg weakness with pain HPI: Todd Gonzales is a 77 year old individual who has had extensive lumbar spondylosis in the past.  He has had surgical decompression in the lower lumbar spine and he recently has developed increasing symptoms and signs of neurogenic claudication.  He was evaluated with an MRI which shows that he is developed a progressive stenosis at T12-L1.  After careful discussion of his situation I advised a simple laminectomy to decompress T12-L1 to see if this will help alleviate the worst of his symptoms.  He is now admitted for this procedure.  Past Medical History:  Diagnosis Date  . Arthritis   . BPH (benign prostatic hyperplasia)    nocturia x4 at baseline  . H/O hiatal hernia   . Hemorrhoids   . Hyperlipidemia   . Hypertension    Dr Osborne Casco- LOV with clearance 3/13 on chart  . IGT (impaired glucose tolerance)    last AIC 5.5- diet controlled- per office note 3/13  . Seasonal allergies   . Spinal stenosis   . UTI (lower urinary tract infection)    required admission to [facility]    Past Surgical History:  Procedure Laterality Date  . BACK SURGERY  2008   Laminectomy L4-5  . JOINT REPLACEMENT Bilateral    bil  . KNEE ARTHROSCOPY     right  . TOTAL HIP ARTHROPLASTY  03/20/2012   Procedure: TOTAL HIP ARTHROPLASTY ANTERIOR APPROACH;  Surgeon: Mauri Pole, MD;  Location: [facility];  Service: Orthopedics;  Laterality: Left;    Family History  Problem Relation Age of Onset  . Arthritis Mother   . Lung cancer Father   . Asthma Father   . Colon cancer Neg Hx   . Prostate cancer Neg Hx    Social History:  reports that he has never smoked. He has never used smokeless tobacco. He reports current alcohol use. He reports that he does not use drugs.  Allergies:  Allergies  Allergen Reactions  . Codeine Other (See Comments)    Can't remember. UNSPECIFIED REACTION     Medications Prior to Admission   Medication Sig Dispense Refill  . benazepril-hydrochlorthiazide (LOTENSIN HCT) 20-12.5 MG tablet Take 0.5 tablets by mouth daily after breakfast.    . celecoxib (CELEBREX) 200 MG capsule Take 200 mg by mouth daily.    . Cholecalciferol (VITAMIN D-1000 MAX ST) 1000 UNITS tablet Take 1,000 Units by mouth.    . cyanocobalamin 1000 MCG tablet Take 1,000 mcg by mouth daily.    . diclofenac sodium (VOLTAREN) 1 % GEL Apply topically 4 (four) times daily.    Marland Kitchen gabapentin (NEURONTIN) 100 MG capsule Take 200 mg by mouth at bedtime.     Marland Kitchen ibuprofen (ADVIL,MOTRIN) 200 MG tablet Take 200 mg by mouth as needed for moderate pain.    . Menthol, Topical Analgesic, (BIOFREEZE) 10 % LIQD Apply 1 application topically 2 (two) times daily as needed. Pain     . OVER THE COUNTER MEDICATION Protegra Antioxidant    . oxymetazoline (AFRIN) 0.05 % nasal spray Place 2 sprays into the nose 2 (two) times daily as needed. Allergies     . sodium chloride (OCEAN) 0.65 % nasal spray Place 1 spray into the nose as needed.    . tamsulosin (FLOMAX) 0.4 MG CAPS capsule Take 0.4 mg by mouth.    . vitamin E 400 UNIT capsule Take 400 Units by mouth.    Marland Kitchen  amoxicillin (AMOXIL) 500 MG tablet Take 500 mg by mouth. As needed for dental work.      No results found for this or any previous visit (from the past 48 hour(s)). No results found.  Review of Systems  Constitutional: Negative.   HENT: Negative.   Eyes: Negative.   Respiratory: Negative.   Cardiovascular: Negative.   Gastrointestinal: Negative.   Genitourinary: Negative.   Musculoskeletal: Positive for back pain.  Skin: Negative.   Neurological: Positive for tingling, sensory change and weakness.  Endo/Heme/Allergies: Negative.   Psychiatric/Behavioral: Negative.     Blood pressure (!) 154/85, pulse 76, temperature 98.8 F (37.1 C), resp. rate 17, SpO2 92 %. Physical Exam  Constitutional: He is oriented to person, place, and time. He appears well-developed and  well-nourished.  Morbidly obese  HENT:  Head: Normocephalic and atraumatic.  Eyes: Pupils are equal, round, and reactive to light. Conjunctivae and EOM are normal.  Neck: Normal range of motion. Neck supple.  Cardiovascular: Regular rhythm.  Respiratory: Effort normal and breath sounds normal.  GI: Soft. Bowel sounds are normal.  Musculoskeletal:     Comments: Positive straight leg raising at 30 degrees in either lower extremity Patrick's maneuver is negative bilaterally.  Neurological: He is alert and oriented to person, place, and time.  Mild weakness in the iliopsoas in the quadriceps bilaterally scissor-like gait with increased tone in both lower extremities absent peripheral reflexes in the patella and the Achilles both.  Cranial nerve examination upper extremity strength is normal  Skin: Skin is warm and dry.  Psychiatric: He has a normal mood and affect. His behavior is normal. Judgment and thought content normal.     Assessment/Plan Lumbar stenosis T12-L1 with myelopathy.  Plan: Bilateral laminotomy and decompression T12-L1  Earleen Newport, MD 11/20/2018, 7:30 AM

## 2018-11-20 NOTE — Evaluation (Signed)
Physical Therapy Evaluation Patient Details Name: Todd Gonzales MRN: [id number] DOB: May 03, 1941 Today's Date: 11/20/2018   History of Present Illness  Pt is a 77 y.o. male s/p bilateral laminotomies T12-L1 decompression on 11/20/18. PMH includes spinal stenosis, laminectomy L4-5 (2008), HTN, THA (2013).    Clinical Impression  Pt presents with an overall decrease in functional mobility secondary to above. PTA, pt indep and lives with wife available for 24/7 support; does endorse some falls with difficulty getting up. Today, pt able to transfer and ambulate with RW and supervision for safety. Educ on precautions, positioning, and importance of mobility. Pt would benefit from continued acute PT services to maximize functional mobility and independence prior to d/c with HHPT services.     Follow Up Recommendations Home health PT;Supervision for mobility/OOB    Equipment Recommendations  None recommended by PT    Recommendations for Other Services       Precautions / Restrictions Precautions Precautions: Fall;Back Precaution Booklet Issued: Yes (comment) Precaution Comments: Verbally reviewed precautions; pt able to recall 3/3 at end of session Restrictions Weight Bearing Restrictions: No      Mobility  Bed Mobility Overal bed mobility: Needs Assistance Bed Mobility: Supine to Sit     Supine to sit: Supervision     General bed mobility comments: Pt will have hospital bed delivered to home prior to d/c; prefers to have HOB and use BUEs to push into long sitting versus log roll technique. Able to perform with supervision; good ability to keep back straight  Transfers Overall transfer level: Needs assistance Equipment used: Rolling walker (2 wheeled) Transfers: Sit to/from Stand Sit to Stand: Supervision            Ambulation/Gait Ambulation/Gait assistance: Supervision Gait Distance (Feet): 350 Feet Assistive device: Rolling walker (2 wheeled) Gait  Pattern/deviations: Step-through pattern;Decreased stride length;Trunk flexed Gait velocity: Decreased Gait velocity interpretation: 1.31 - 2.62 ft/sec, indicative of limited community ambulator General Gait Details: Slow, steady amb with RW and supervision for safety. Pt able to walk into/out of bathroom to void standing at toilet without UE support; supervision for safety  Stairs            Wheelchair Mobility    Modified Rankin (Stroke Patients Only)       Balance Overall balance assessment: Needs assistance   Sitting balance-Leahy Scale: Fair       Standing balance-Leahy Scale: Fair                               Pertinent Vitals/Pain Pain Assessment: Faces Faces Pain Scale: Hurts a little bit Pain Location: Back Pain Descriptors / Indicators: Sore Pain Intervention(s): Premedicated before session;Limited activity within patient's tolerance    Home Living Family/patient expects to be discharged to:: Private residence Living Arrangements: Spouse/significant other Available Help at Discharge: Family;Available 24 hours/day Type of Home: House Home Access: Level entry(threshold)     Home Layout: One level Home Equipment: Walker - 2 wheels;Cane - single point;Shower seat;Hospital bed      Prior Function Level of Independence: Independent         Comments: Wife reports some falls that pt not able to get up on own and wife unable to assist. Pt and wife remain active, traveling often     Hand Dominance        Extremity/Trunk Assessment   Upper Extremity Assessment Upper Extremity Assessment: LUE deficits/detail LUE Deficits / Details: Pt reports painless "  frozen shoulder" with L shoulder flex/abd limited to <90'    Lower Extremity Assessment Lower Extremity Assessment: Overall WFL for tasks assessed       Communication   Communication: No difficulties  Cognition Arousal/Alertness: Awake/alert Behavior During Therapy: WFL for tasks  assessed/performed Overall Cognitive Status: Within Functional Limits for tasks assessed                                        General Comments General comments (skin integrity, edema, etc.): Wife present and supportive; wife anxious that pt will fall again    Exercises     Assessment/Plan    PT Assessment Patient needs continued PT services  PT Problem List Decreased activity tolerance;Decreased balance;Decreased mobility;Decreased knowledge of use of DME;Decreased knowledge of precautions       PT Treatment Interventions DME instruction;Gait training;Stair training;Functional mobility training;Therapeutic activities;Therapeutic exercise;Balance training;Patient/family education    PT Goals (Current goals can be found in the Care Plan section)  Acute Rehab PT Goals Patient Stated Goal: Return home PT Goal Formulation: With patient/family Time For Goal Achievement: 12/04/18 Potential to Achieve Goals: Good    Frequency Min 5X/week   Barriers to discharge        Co-evaluation               AM-PAC PT "6 Clicks" Mobility  Outcome Measure Help needed turning from your back to your side while in a flat bed without using bedrails?: A Little Help needed moving from lying on your back to sitting on the side of a flat bed without using bedrails?: A Little Help needed moving to and from a bed to a chair (including a wheelchair)?: A Little Help needed standing up from a chair using your arms (e.g., wheelchair or bedside chair)?: A Little Help needed to walk in hospital room?: A Little Help needed climbing 3-5 steps with a railing? : A Little 6 Click Score: 18    End of Session Equipment Utilized During Treatment: Gait belt Activity Tolerance: Patient tolerated treatment well Patient left: in bed;with call bell/phone within reach;with family/visitor present Nurse Communication: Mobility status PT Visit Diagnosis: Other abnormalities of gait and mobility  (R26.89)    Time: 7106-2694 PT Time Calculation (min) (ACUTE ONLY): 29 min   Charges:   PT Evaluation $PT Eval Moderate Complexity: 1 Mod PT Treatments $Gait Training: 8-22 mins      Mabeline Caras, PT, DPT Acute Rehabilitation Services  Pager [phone] Office [place] 11/20/2018, 2:13 PM

## 2018-11-20 NOTE — Progress Notes (Signed)
Patient ID: Todd Gonzales, male   DOB: 1941/11/04, 77 y.o.   MRN: [id number] Vital signs are stable Patient is awake and alert Motor function is stable and lower extremities We will see how he mobilizes in the next day

## 2018-11-20 NOTE — Op Note (Signed)
Date of surgery: 11/20/2018 Preoperative diagnosis: Lumbar stenosis with myelopathy T12-L1 Postoperative diagnosis: Same Procedure: Bilateral laminotomies T12-L1 decompression of the common dural tube. Surgeon: Kristeen Miss First Assistant: Erline Levine, MD Anesthesia: General endotracheal Indications: Todd Gonzales is a 77 year old individual whose had progressive weakness in his legs developing over a number of months.  Patient has a known history of previous spondylitic stenosis and has had decompression via laminotomies lower in his spine.  More recently an MRI demonstrated that some moderate stenosis that he had at T12-L1 has worsened with protrusion of the disc and significant canal compromise has been advised regarding surgery to decompress this area via bilateral laminotomies and foraminotomies.  Procedure: The patient was brought to the operating supine on a stretcher.  After the smooth induction of general endotracheal anesthesia he was carefully turned prone.  Back was prepped with alcohol and DuraPrep and draped in a sterile fashion.  A localizing x-ray was taken with a needle placed at the T12-L1 space to confirm its appropriateness.  Vertical incision was created in this region carried down to the lumbodorsal fascia which was opened on either side of the midline to expose the spinous processes at T12 and L1.  A small laminotomy was created at T12-L1 and the second radiograph confirmed the presence of T12-L1 positively.  Then the laminotomy was enlarged and the old ligament that was thickened and redundant in this region was taken up.  The underlying dura was identified.  Decompression was then performed bilaterally removing thickened redundant yellow ligament that was comprising a substantial portion of the compression.  The lateral and underside of the dura was then carefully palpated and there was a bulge of the disc pointing dorsally however this was not very significant as not felt that a  discectomy in this region would yield much better decompression.  Then the undersurface of L1 was carefully palpated there was significant bony overhang from the superior margin alignment of L1.  This was carefully drilled down but in the process of doing so a small dural tear had occurred.  This was closed primarily with a singular 6-0 Prolene suture.  The closure was intact to a vigorous Valsalva to 60 cm of water.  With this the area was carefully inspected and hemostasis in the soft tissues was obtained and the lumbodorsal fascia was closed with #1 Vicryl in interrupted fashion 2-0 Vicryl was used in subcutaneous tissues and 3-0 Vicryl subcuticularly.  Blood loss for the entire procedure was estimated at approximately 100 cc.  Patient tolerated procedure well returned to recovery room in stable condition.

## 2018-11-20 NOTE — Transfer of Care (Signed)
Immediate Anesthesia Transfer of Care Note  Patient: Todd Gonzales  Procedure(s) Performed: Bilateral Thoracic Twelve to Lumbar One Laminectomy (Bilateral Spine Lumbar)  Patient Location: PACU  Anesthesia Type:General  Level of Consciousness: awake, alert  and oriented  Airway & Oxygen Therapy: Patient Spontanous Breathing and Patient connected to nasal cannula oxygen  Post-op Assessment: Report given to RN and Post -op Vital signs reviewed and stable  Post vital signs: Reviewed and stable  Last Vitals:  Vitals Value Taken Time  BP 171/94 11/20/2018 10:01 AM  Temp 36.5 C 11/20/2018 10:00 AM  Pulse 76 11/20/2018 10:04 AM  Resp 19 11/20/2018 10:05 AM  SpO2 94 % 11/20/2018 10:04 AM  Vitals shown include unvalidated device data.  Last Pain:  Vitals:   11/20/18 0631  PainSc: 0-No pain      Patients Stated Pain Goal: 3 (27/07/86 7544)  Complications: No apparent anesthesia complications

## 2018-11-20 NOTE — Anesthesia Preprocedure Evaluation (Signed)
Anesthesia Evaluation  Patient identified by MRN, date of birth, ID band Patient awake    Reviewed: Allergy & Precautions, NPO status , Patient's Chart, lab work & pertinent test results  History of Anesthesia Complications Negative for: history of anesthetic complications  Airway Mallampati: IV  TM Distance: >3 FB Neck ROM: Full    Dental  (+) Partial Lower, Partial Upper   Pulmonary neg pulmonary ROS,    breath sounds clear to auscultation       Cardiovascular hypertension, Pt. on medications (-) angina(-) Past MI and (-) CHF  Rhythm:Regular     Neuro/Psych  Neuromuscular disease negative psych ROS   GI/Hepatic Neg liver ROS, hiatal hernia, PUD, neg GERD  ,  Endo/Other  Morbid obesity  Renal/GU negative Renal ROS     Musculoskeletal  (+) Arthritis ,   Abdominal   Peds  Hematology negative hematology ROS (+)   Anesthesia Other Findings   Reproductive/Obstetrics                             Anesthesia Physical Anesthesia Plan  ASA: II  Anesthesia Plan: General   Post-op Pain Management:    Induction: Intravenous  PONV Risk Score and Plan: 2 and Ondansetron and Dexamethasone  Airway Management Planned: Oral ETT  Additional Equipment: None  Intra-op Plan:   Post-operative Plan: Extubation in OR  Informed Consent: I have reviewed the patients History and Physical, chart, labs and discussed the procedure including the risks, benefits and alternatives for the proposed anesthesia with the patient or authorized representative who has indicated his/her understanding and acceptance.   Dental advisory given  Plan Discussed with: CRNA and Surgeon  Anesthesia Plan Comments:         Anesthesia Quick Evaluation

## 2018-11-20 NOTE — Anesthesia Procedure Notes (Signed)
Procedure Name: Intubation Date/Time: 11/20/2018 8:00 AM Performed by: Valda Favia, CRNA Pre-anesthesia Checklist: Patient identified, Emergency Drugs available, Suction available and Patient being monitored Patient Re-evaluated:Patient Re-evaluated prior to induction Oxygen Delivery Method: Circle System Utilized Preoxygenation: Pre-oxygenation with 100% oxygen Induction Type: IV induction Ventilation: Mask ventilation without difficulty and Oral airway inserted - appropriate to patient size Laryngoscope Size: Mac and 4 Grade View: Grade II Tube type: Oral Tube size: 7.5 mm Number of attempts: 1 Airway Equipment and Method: Stylet and Oral airway Placement Confirmation: ETT inserted through vocal cords under direct vision,  positive ETCO2 and breath sounds checked- equal and bilateral Secured at: 23 cm Tube secured with: Tape Dental Injury: Teeth and Oropharynx as per pre-operative assessment

## 2018-11-21 ENCOUNTER — Encounter (HOSPITAL_COMMUNITY): Payer: Self-pay | Admitting: Neurological Surgery

## 2018-11-21 DIAGNOSIS — Z885 Allergy status to narcotic agent status: Secondary | ICD-10-CM | POA: Diagnosis not present

## 2018-11-21 DIAGNOSIS — Z96642 Presence of left artificial hip joint: Secondary | ICD-10-CM | POA: Diagnosis not present

## 2018-11-21 DIAGNOSIS — Z96649 Presence of unspecified artificial hip joint: Secondary | ICD-10-CM | POA: Diagnosis not present

## 2018-11-21 DIAGNOSIS — Z79899 Other long term (current) drug therapy: Secondary | ICD-10-CM | POA: Diagnosis not present

## 2018-11-21 DIAGNOSIS — M48061 Spinal stenosis, lumbar region without neurogenic claudication: Secondary | ICD-10-CM | POA: Diagnosis not present

## 2018-11-21 DIAGNOSIS — N401 Enlarged prostate with lower urinary tract symptoms: Secondary | ICD-10-CM | POA: Diagnosis not present

## 2018-11-21 DIAGNOSIS — R351 Nocturia: Secondary | ICD-10-CM | POA: Diagnosis not present

## 2018-11-21 DIAGNOSIS — I10 Essential (primary) hypertension: Secondary | ICD-10-CM | POA: Diagnosis not present

## 2018-11-21 DIAGNOSIS — K449 Diaphragmatic hernia without obstruction or gangrene: Secondary | ICD-10-CM | POA: Diagnosis not present

## 2018-11-21 DIAGNOSIS — Z9889 Other specified postprocedural states: Secondary | ICD-10-CM | POA: Diagnosis not present

## 2018-11-21 DIAGNOSIS — E785 Hyperlipidemia, unspecified: Secondary | ICD-10-CM | POA: Diagnosis not present

## 2018-11-21 DIAGNOSIS — M1990 Unspecified osteoarthritis, unspecified site: Secondary | ICD-10-CM | POA: Diagnosis not present

## 2018-11-21 DIAGNOSIS — M169 Osteoarthritis of hip, unspecified: Secondary | ICD-10-CM | POA: Diagnosis not present

## 2018-11-21 DIAGNOSIS — Z6833 Body mass index (BMI) 33.0-33.9, adult: Secondary | ICD-10-CM | POA: Diagnosis not present

## 2018-11-21 DIAGNOSIS — Z791 Long term (current) use of non-steroidal anti-inflammatories (NSAID): Secondary | ICD-10-CM | POA: Diagnosis not present

## 2018-11-21 DIAGNOSIS — M4715 Other spondylosis with myelopathy, thoracolumbar region: Secondary | ICD-10-CM | POA: Diagnosis not present

## 2018-11-21 DIAGNOSIS — M4805 Spinal stenosis, thoracolumbar region: Secondary | ICD-10-CM | POA: Diagnosis not present

## 2018-11-21 MED ORDER — MAGNESIUM CITRATE PO SOLN
1.0000 | Freq: Once | ORAL | Status: AC
  Administered 2018-11-21: 1 via ORAL

## 2018-11-21 NOTE — Evaluation (Signed)
Occupational Therapy Evaluation and Discharge Patient Details Name: Todd Gonzales MRN: [id number] DOB: Sep 15, 1941 Today's Date: 11/21/2018    History of Present Illness Pt is a 77 y/o male s/p bilateral laminotomies T12-L1 decompression on 11/20/18. PMH includes spinal stenosis, laminectomy L4-5 (2008), HTN, THA (2013).   Clinical Impression   Pt educated in back precautions related to ADL and IADL, use of AE and multiple uses of 3 in 1. Pt verbalizing understanding of all information. No further OT needs.   Follow Up Recommendations  No OT follow up    Equipment Recommendations  3 in 1 bedside commode    Recommendations for Other Services       Precautions / Restrictions Precautions Precautions: Fall;Back Precaution Booklet Issued: Yes (comment) Precaution Comments: educated in back precautions related to ADL and IADL  Restrictions Weight Bearing Restrictions: No      Mobility Bed Mobility     General bed mobility comments: pt seated at EOB  Transfers Overall transfer level: Needs assistance Equipment used: Rolling walker (2 wheeled) Transfers: Sit to/from Stand Sit to Stand: Supervision         General transfer comment: cues for hand placement, stood from elevated bed and 3in 1    Balance Overall balance assessment: Needs assistance Sitting-balance support: No upper extremity supported;Feet supported Sitting balance-Leahy Scale: Fair     Standing balance support: No upper extremity supported Standing balance-Leahy Scale: Fair Standing balance comment: can stand without UE assist at sink                           ADL either performed or assessed with clinical judgement   ADL Overall ADL's : Needs assistance/impaired Eating/Feeding: Independent;Sitting   Grooming: Oral care;Wash/dry hands;Standing Grooming Details (indicate cue type and reason): educated in 2 cup method for oral care Upper Body Bathing: Minimal assistance;Sitting Upper  Body Bathing Details (indicate cue type and reason): educated to use back brush Lower Body Bathing: Minimal assistance;Sit to/from stand Lower Body Bathing Details (indicate cue type and reason): educated in use of 3 in 1 as shower seat and back brush for feet Upper Body Dressing : Set up;Sitting   Lower Body Dressing: Minimal assistance;Sit to/from stand Lower Body Dressing Details (indicate cue type and reason): educated to use his reacher to start pants over feet Toilet Transfer: Supervision/safety;Ambulation;RW;BSC     Toileting - Clothing Manipulation Details (indicate cue type and reason): instructed pt to avoid twisting with pericare, use of tongs as needed     Functional mobility during ADLs: Supervision/safety;Rolling walker       Vision Patient Visual Report: No change from baseline       Perception     Praxis      Pertinent Vitals/Pain Pain Assessment: Faces Faces Pain Scale: Hurts a little bit Pain Location: Back Pain Descriptors / Indicators: Sore Pain Intervention(s): Monitored during session;Premedicated before session     Hand Dominance Right   Extremity/Trunk Assessment Upper Extremity Assessment Upper Extremity Assessment: RUE deficits/detail;LUE deficits/detail RUE Deficits / Details: longstanding shoulder limitations LUE Deficits / Details: longstanding shoulder limitations   Lower Extremity Assessment Lower Extremity Assessment: Defer to PT evaluation       Communication Communication Communication: No difficulties   Cognition Arousal/Alertness: Awake/alert Behavior During Therapy: WFL for tasks assessed/performed Overall Cognitive Status: Within Functional Limits for tasks assessed  General Comments       Exercises     Shoulder Instructions      Home Living Family/patient expects to be discharged to:: Private residence Living Arrangements: Spouse/significant other Available Help at  Discharge: Family;Available 24 hours/day Type of Home: House Home Access: Level entry     Home Layout: One level     Bathroom Shower/Tub: Occupational psychologist: Handicapped height     Home Equipment: Environmental consultant - 2 wheels;Cane - single point;Hospital bed;Hand held shower head;Adaptive equipment;Grab bars - tub/shower;Shower seat - built in [employer] Equipment: Reacher;Sock aid;Long-handled sponge        Prior Functioning/Environment Level of Independence: Needs assistance    ADL's / Homemaking Assistance Needed: wife assists with LB dressing, reminded pt to use his AE            OT Problem List:        OT Treatment/Interventions:      OT Goals(Current goals can be found in the care plan section) Acute Rehab OT Goals Patient Stated Goal: Return home  OT Frequency:     Barriers to D/C:            Co-evaluation              AM-PAC OT "6 Clicks" Daily Activity     Outcome Measure Help from another person eating meals?: None Help from another person taking care of personal grooming?: A Little Help from another person toileting, which includes using toliet, bedpan, or urinal?: A Little Help from another person bathing (including washing, rinsing, drying)?: A Little Help from another person to put on and taking off regular upper body clothing?: None Help from another person to put on and taking off regular lower body clothing?: A Little 6 Click Score: 20   End of Session Equipment Utilized During Treatment: Gait belt;Rolling walker  Activity Tolerance: Patient tolerated treatment well Patient left: in bed;with call bell/phone within reach;Other (comment)(MD in room)  OT Visit Diagnosis: Pain                Time: 7654-6503 OT Time Calculation (min): 15 min Charges:  OT General Charges $OT Visit: 1 Visit OT Evaluation $OT Eval Moderate Complexity: [place], OTR/L Acute Rehabilitation Services Pager: [phone] Office:  [phone]  Malka So 11/21/2018, 11:48 AM

## 2018-11-21 NOTE — Progress Notes (Addendum)
Physical Therapy Narrative for Hospital Bed  This patient is s/p lumbar surgery which impairs their ability to perform daily activities like negotiating a flight of stairs in the home. Pt will need to remain on the first floor to safely perform activities of daily living. A hospital bed will allow patient to safely remain on the first floor as well as allow for safe position changes. Pt cannot get in and out of a regular bed at this time. The patient has a caregiver who can provide assistance.    [name], PT, DPT Acute Rehabilitation Services Pager: [phone] Office: [phone]

## 2018-11-21 NOTE — Progress Notes (Signed)
Physical Therapy Treatment Patient Details Name: Todd Gonzales MRN: [id number] DOB: 07-11-1941 Today's Date: 11/21/2018    History of Present Illness Pt is a 77 y/o male s/p bilateral laminotomies T12-L1 decompression on 11/20/18. PMH includes spinal stenosis, laminectomy L4-5 (2008), HTN, THA (2013).    PT Comments    Pt progressing towards physical therapy goals. Was able to perform transfers and ambulation with gross supervision to min guard assist with RW for support. Pt reporting his LE's feeling weak and heavy with attempts at ambulation. Pt declining further gait training and states "I'm not ready for this." RN notified. VSS throughout session (see Vitals Flowsheet for more detail). Will continue to follow and progress as able per POC.    Follow Up Recommendations  Home health PT;Supervision for mobility/OOB     Equipment Recommendations  None recommended by PT    Recommendations for Other Services       Precautions / Restrictions Precautions Precautions: Fall;Back Precaution Booklet Issued: Yes (comment) Precaution Comments: Pt was cued for precautions during functional mobility.  Restrictions Weight Bearing Restrictions: No    Mobility  Bed Mobility Overal bed mobility: Needs Assistance Bed Mobility: Supine to Sit;Rolling;Sidelying to Sit;Sit to Sidelying Rolling: Supervision Sidelying to sit: Supervision Supine to sit: Supervision   Sit to sidelying: Supervision General bed mobility comments: Pt initially with general supine<>sit. He was cued for log roll technique and required several attempts for instruction and proper demonstration.  Transfers Overall transfer level: Needs assistance Equipment used: Rolling walker (2 wheeled) Transfers: Sit to/from Stand Sit to Stand: Supervision         General transfer comment: VC's for hand placement on seated surface for safety. No assist required to power-up to full stand however close supervision for safety was  provided.   Ambulation/Gait Ambulation/Gait assistance: Min guard Gait Distance (Feet): 25 Feet Assistive device: Rolling walker (2 wheeled) Gait Pattern/deviations: Step-through pattern;Decreased stride length;Trunk flexed Gait velocity: Decreased Gait velocity interpretation: <1.8 ft/sec, indicate of risk for recurrent falls General Gait Details: Upon initiation of gait, pt stating "my legs don't feel right. I'm not ready for this." Pt returned to sitting EOB and states he feels as if his legs were suddenly weak. Vitals stable during this time and RN notified. Pt participated in pre-gait activity EOB including seated marches, heel/toe raises, and LAQ. On second attempt at gait pt again reports he is "not ready for this" and declines further activity.    Stairs             Wheelchair Mobility    Modified Rankin (Stroke Patients Only)       Balance Overall balance assessment: Needs assistance Sitting-balance support: No upper extremity supported;Feet supported Sitting balance-Leahy Scale: Fair     Standing balance support: No upper extremity supported Standing balance-Leahy Scale: Fair                              Cognition Arousal/Alertness: Awake/alert Behavior During Therapy: WFL for tasks assessed/performed Overall Cognitive Status: Within Functional Limits for tasks assessed                                        Exercises      General Comments        Pertinent Vitals/Pain Pain Assessment: Faces Faces Pain Scale: Hurts a little bit Pain Location: Back Pain  Descriptors / Indicators: Sore Pain Intervention(s): Monitored during session    Home Living                      Prior Function            PT Goals (current goals can now be found in the care plan section) Acute Rehab PT Goals Patient Stated Goal: Return home PT Goal Formulation: With patient/family Time For Goal Achievement: 12/04/18 Potential to  Achieve Goals: Good Progress towards PT goals: Progressing toward goals    Frequency    Min 5X/week      PT Plan Current plan remains appropriate    Co-evaluation              AM-PAC PT "6 Clicks" Mobility   Outcome Measure  Help needed turning from your back to your side while in a flat bed without using bedrails?: A Little Help needed moving from lying on your back to sitting on the side of a flat bed without using bedrails?: A Little Help needed moving to and from a bed to a chair (including a wheelchair)?: A Little Help needed standing up from a chair using your arms (e.g., wheelchair or bedside chair)?: A Little Help needed to walk in hospital room?: A Little Help needed climbing 3-5 steps with a railing? : A Little 6 Click Score: 18    End of Session Equipment Utilized During Treatment: Gait belt Activity Tolerance: Patient tolerated treatment well Patient left: in bed;with call bell/phone within reach;with family/visitor present Nurse Communication: Mobility status PT Visit Diagnosis: Other abnormalities of gait and mobility (R26.89)     Time: 4680-3212 PT Time Calculation (min) (ACUTE ONLY): 19 min  Charges:  $Gait Training: 8-22 mins                     [name], PT, DPT Acute Rehabilitation Services Pager: [phone] Office: [phone]    [name] 11/21/2018, 10:53 AM

## 2018-11-21 NOTE — Care Management Note (Signed)
Case Management Note  Patient Details  Name: Todd Gonzales MRN: [id number] Date of Birth: [birth year]  Subjective/Objective:   77 yr old gentleman s/p T12-L1 bilateral laminectomies.  Action/Plan: Case manager spoke with patient concerning discharge plan and DME.  Patient said they have used [name] and want to do so now. Referral was called to [facility], [facility]. CM called Brad with [facility] to arrange for hospital bed. Patient will have family support at discharge. Patient will have family support at discharge,.  .    Expected Discharge Date:   11/22/18               Expected Discharge Plan:  [place]  In-House Referral:  NA  Discharge planning Services  CM Consult  Post Acute Care Choice:  Durable Medical Equipment, Home Health Choice offered to:  Patient  DME Arranged:  Hospital bed DME Agency:  [place]:  PT [place]:  [facility]  Status of Service:  Completed, signed off  If discussed at [employer] of Stay Meetings, dates discussed:    Additional Comments:  Ninfa Meeker, RN 11/21/2018, 10:21 AM

## 2018-11-21 NOTE — Progress Notes (Signed)
Patient ID: Todd Gonzales, male   DOB: Sep 24, 1941, 77 y.o.   MRN: [id number] Vital signs are stable Is mobilizing reasonably well Notes that does not feel comfortable on stairs and wife would like him to have hospital bed on first floor of house until recuperates more We will arrange for this today and plan discharge for tomorrow PT and OT to continue working with patient

## 2018-11-22 DIAGNOSIS — M169 Osteoarthritis of hip, unspecified: Secondary | ICD-10-CM | POA: Diagnosis not present

## 2018-11-22 DIAGNOSIS — Z96649 Presence of unspecified artificial hip joint: Secondary | ICD-10-CM | POA: Diagnosis not present

## 2018-11-22 DIAGNOSIS — M1990 Unspecified osteoarthritis, unspecified site: Secondary | ICD-10-CM | POA: Diagnosis not present

## 2018-11-22 DIAGNOSIS — M48061 Spinal stenosis, lumbar region without neurogenic claudication: Secondary | ICD-10-CM | POA: Diagnosis not present

## 2018-11-22 DIAGNOSIS — I10 Essential (primary) hypertension: Secondary | ICD-10-CM | POA: Diagnosis not present

## 2018-11-22 DIAGNOSIS — Z791 Long term (current) use of non-steroidal anti-inflammatories (NSAID): Secondary | ICD-10-CM | POA: Diagnosis not present

## 2018-11-22 DIAGNOSIS — Z96642 Presence of left artificial hip joint: Secondary | ICD-10-CM | POA: Diagnosis not present

## 2018-11-22 DIAGNOSIS — N401 Enlarged prostate with lower urinary tract symptoms: Secondary | ICD-10-CM | POA: Diagnosis not present

## 2018-11-22 DIAGNOSIS — K449 Diaphragmatic hernia without obstruction or gangrene: Secondary | ICD-10-CM | POA: Diagnosis not present

## 2018-11-22 DIAGNOSIS — E785 Hyperlipidemia, unspecified: Secondary | ICD-10-CM | POA: Diagnosis not present

## 2018-11-22 DIAGNOSIS — R351 Nocturia: Secondary | ICD-10-CM | POA: Diagnosis not present

## 2018-11-22 DIAGNOSIS — Z79899 Other long term (current) drug therapy: Secondary | ICD-10-CM | POA: Diagnosis not present

## 2018-11-22 DIAGNOSIS — Z9889 Other specified postprocedural states: Secondary | ICD-10-CM | POA: Diagnosis not present

## 2018-11-22 DIAGNOSIS — M4715 Other spondylosis with myelopathy, thoracolumbar region: Secondary | ICD-10-CM | POA: Diagnosis not present

## 2018-11-22 DIAGNOSIS — M4805 Spinal stenosis, thoracolumbar region: Secondary | ICD-10-CM | POA: Diagnosis not present

## 2018-11-22 DIAGNOSIS — Z885 Allergy status to narcotic agent status: Secondary | ICD-10-CM | POA: Diagnosis not present

## 2018-11-22 DIAGNOSIS — Z6833 Body mass index (BMI) 33.0-33.9, adult: Secondary | ICD-10-CM | POA: Diagnosis not present

## 2018-11-22 MED ORDER — OXYCODONE-ACETAMINOPHEN 5-325 MG PO TABS: 1.0000 | ORAL_TABLET | ORAL | 0 refills | Status: DC | PRN

## 2018-11-22 MED ORDER — MORPHINE SULFATE (PF) 2 MG/ML IV SOLN
2.0000 mg | INTRAVENOUS | Status: DC | PRN
  Administered 2018-11-22: 2 mg via INTRAMUSCULAR

## 2018-11-22 MED ORDER — METHOCARBAMOL 500 MG PO TABS: 500.0000 mg | ORAL_TABLET | Freq: Four times a day (QID) | ORAL | 3 refills | Status: DC | PRN

## 2018-11-22 MED ORDER — DEXAMETHASONE 4 MG PO TABS
4.0000 mg | ORAL_TABLET | Freq: Every day | ORAL | Status: DC
  Administered 2018-11-22: 4 mg via ORAL
  Filled 2018-11-22: qty 1

## 2018-11-22 MED ORDER — DEXAMETHASONE 1 MG PO TABS: ORAL_TABLET | ORAL | 0 refills | Status: DC

## 2018-11-22 NOTE — Discharge Instructions (Signed)

## 2018-11-22 NOTE — Progress Notes (Signed)
Patient ID: Todd Gonzales, male   DOB: [birth year], 77 y.o.   MRN: [id number] Vital signs are stable Patient complains of significant back pain with some spasms in the lower extremities Bowels have not moved Patient notes that accommodations are a bit too small He has been ambulatory and out encouraged him to continue doing so We will add a dose of Decadron Hopeful for discharge this afternoon if Decadron helps ease spasms We will give oral Decadron Dosepak for home use.

## 2018-11-22 NOTE — Anesthesia Postprocedure Evaluation (Signed)
Anesthesia Post Note  Patient: Todd Gonzales  Procedure(s) Performed: Bilateral Thoracic Twelve to Lumbar One Laminectomy (Bilateral Spine Lumbar)     Patient location during evaluation: PACU Anesthesia Type: General Level of consciousness: awake and alert Pain management: pain level controlled Vital Signs Assessment: post-procedure vital signs reviewed and stable Respiratory status: spontaneous breathing, nonlabored ventilation, respiratory function stable and patient connected to nasal cannula oxygen Cardiovascular status: blood pressure returned to baseline and stable Postop Assessment: no apparent nausea or vomiting Anesthetic complications: no    Last Vitals:  Vitals:   11/22/18 0303 11/22/18 0753  BP: 131/77 (!) 145/81  Pulse: 70 76  Resp: 20 18  Temp: 36.5 C 36.4 C  SpO2: 93% 98%    Last Pain:  Vitals:   11/22/18 1430  TempSrc:   PainSc: 5                  Fergus Throne

## 2018-11-22 NOTE — Progress Notes (Signed)
Physical Therapy Treatment Patient Details Name: Todd Gonzales MRN: [id number] DOB: [birth year] Today's Date: 11/22/2018    History of Present Illness Pt is a 77 y/o male s/p bilateral laminotomies T12-L1 decompression on 11/20/18. PMH includes spinal stenosis, laminectomy L4-5 (2008), HTN, THA (2013).    PT Comments    Pt adamantly declining further mobility outside of ambulating from bathroom to EOB. PT attempted education regarding benefits to mobility including pain control, lung health, and stimulating bowel activity. Pt stating that he physically cannot walk in the hall because his legs are going to give out on him. PT expressed concern with being able to demonstrate a car transfer, to enter home and mobilize around his house; pt states "I'll be fine once I'm at home." Will continue to follow.   Follow Up Recommendations  Home health PT;Supervision for mobility/OOB     Equipment Recommendations  None recommended by PT    Recommendations for Other Services       Precautions / Restrictions Precautions Precautions: Fall;Back Precaution Booklet Issued: Yes (comment) Precaution Comments: educated in back precautions Restrictions Weight Bearing Restrictions: No    Mobility  Bed Mobility               General bed mobility comments: Pt received sitting on toilet in bathroom  Transfers Overall transfer level: Needs assistance Equipment used: Rolling walker (2 wheeled) Transfers: Sit to/from Stand Sit to Stand: Supervision         General transfer comment: VC's for safe hand placement. Pt was able to power-up to full stand without assistance.   Ambulation/Gait Ambulation/Gait assistance: Supervision Gait Distance (Feet): 10 Feet Assistive device: Rolling walker (2 wheeled) Gait Pattern/deviations: Step-through pattern;Decreased stride length;Trunk flexed Gait velocity: Decreased Gait velocity interpretation: <1.8 ft/sec, indicate of risk for recurrent  falls General Gait Details: Pt ambulated from bathroom to EOB to sit down despite cues to exit bathroom and head to the hallway. Pt reports he is not able to ambualte any further due to weakness and states he is scared his legs are going to give out on him.    Stairs             Wheelchair Mobility    Modified Rankin (Stroke Patients Only)       Balance Overall balance assessment: Needs assistance Sitting-balance support: No upper extremity supported;Feet supported Sitting balance-Leahy Scale: Fair     Standing balance support: No upper extremity supported Standing balance-Leahy Scale: Fair Standing balance comment: can stand without UE assist at sink                            Cognition Arousal/Alertness: Awake/alert Behavior During Therapy: WFL for tasks assessed/performed Overall Cognitive Status: Within Functional Limits for tasks assessed                                        Exercises      General Comments        Pertinent Vitals/Pain Pain Assessment: Faces Faces Pain Scale: Hurts a little bit Pain Location: Back Pain Descriptors / Indicators: Sore Pain Intervention(s): Monitored during session    Home Living Family/patient expects to be discharged to:: Private residence Living Arrangements: Spouse/significant other Available Help at Discharge: Family;Available 24 hours/day Type of Home: House Home Access: Level entry   Home Layout: One level Home Equipment: Walker - 2  wheels;Cane - single point;Hospital bed;Hand held shower head;Adaptive equipment;Grab bars - tub/shower;Shower seat - built in      Prior Function Level of Independence: Needs assistance    ADL's / Homemaking Assistance Needed: wife assists with LB dressing, reminded pt to use his AE Comments: Wife reports some falls that pt not able to get up on own and wife unable to assist. Pt and wife remain active, traveling often   PT Goals (current goals can now  be found in the care plan section) Acute Rehab PT Goals Patient Stated Goal: Return home PT Goal Formulation: With patient/family Time For Goal Achievement: 12/04/18 Potential to Achieve Goals: Good Progress towards PT goals: Progressing toward goals    Frequency    Min 5X/week      PT Plan Current plan remains appropriate    Co-evaluation              AM-PAC PT "6 Clicks" Mobility   Outcome Measure  Help needed turning from your back to your side while in a flat bed without using bedrails?: A Little Help needed moving from lying on your back to sitting on the side of a flat bed without using bedrails?: A Little Help needed moving to and from a bed to a chair (including a wheelchair)?: A Little Help needed standing up from a chair using your arms (e.g., wheelchair or bedside chair)?: A Little Help needed to walk in hospital room?: A Little Help needed climbing 3-5 steps with a railing? : A Little 6 Click Score: 18    End of Session Equipment Utilized During Treatment: Gait belt Activity Tolerance: Other (comment)(Self-limiting) Patient left: in bed;with call bell/phone within reach;with family/visitor present Nurse Communication: Mobility status PT Visit Diagnosis: Other abnormalities of gait and mobility (R26.89)     Time: 0943-1000 PT Time Calculation (min) (ACUTE ONLY): 17 min  Charges:  $Therapeutic Activity: 8-22 mins                     [name], PT, DPT Acute Rehabilitation Services Pager: [phone] Office: [phone]    [name] 11/22/2018, 12:02 PM

## 2018-11-22 NOTE — Discharge Summary (Signed)
Physician Discharge Summary  Patient ID: Todd Gonzales MRN: [id number] DOB/AGE: 1941/02/11 77 y.o.  Admit date: 11/20/2018 Discharge date: 11/22/2018  Admission Diagnoses: Lumbar spondylosis and stenosis with myelopathy T12-L1  Discharge Diagnoses: Lumbar spondylosis and stenosis with myelopathy T12-L1 Active Problems:   Myelopathy concurrent with and due to stenosis of lumbar spine [facility])   Discharged Condition: good  Hospital Course: Patient was admitted to undergo surgical decompression at T12-L1 which he tolerated well.  He has had some spasms in his back in his legs.  Being started on low-dose Decadron dose course at the time of discharge.  Consults: None  Significant Diagnostic Studies: None  Treatments: surgery: Bilateral laminotomies and decompression T12-L1  Discharge Exam: Blood pressure (!) 145/81, pulse 76, temperature 97.6 F (36.4 C), temperature source Oral, resp. rate 18, SpO2 98 %. Incision is clean and dry Station and gait are intact.  Disposition: Discharge disposition: 01-Home or Self Care       Discharge Instructions    Diet - low sodium heart healthy   Complete by:  As directed    Incentive spirometry RT   Complete by:  As directed    Increase activity slowly   Complete by:  As directed      Allergies as of 11/22/2018      Reactions   Codeine Other (See Comments)   Can't remember. UNSPECIFIED REACTION       Medication List    TAKE these medications   amoxicillin 500 MG tablet Commonly known as:  AMOXIL Take 500 mg by mouth. As needed for dental work.   benazepril-hydrochlorthiazide 20-12.5 MG tablet Commonly known as:  LOTENSIN HCT Take 0.5 tablets by mouth daily after breakfast.   BIOFREEZE 10 % Liqd Generic drug:  Menthol (Topical Analgesic) Apply 1 application topically 2 (two) times daily as needed. Pain   celecoxib 200 MG capsule Commonly known as:  CELEBREX Take 200 mg by mouth daily.   cyanocobalamin 1000 MCG  tablet Take 1,000 mcg by mouth daily.   dexamethasone 1 MG tablet Commonly known as:  DECADRON 2 tablets twice daily for 2 days, one tablet twice daily for 2 days, one tablet daily for 2 days.   diclofenac sodium 1 % Gel Commonly known as:  VOLTAREN Apply topically 4 (four) times daily.   FLOMAX 0.4 MG Caps capsule Generic drug:  tamsulosin Take 0.4 mg by mouth.   gabapentin 100 MG capsule Commonly known as:  NEURONTIN Take 200 mg by mouth at bedtime.   ibuprofen 200 MG tablet Commonly known as:  ADVIL,MOTRIN Take 200 mg by mouth as needed for moderate pain.   methocarbamol 500 MG tablet Commonly known as:  ROBAXIN Take 1 tablet (500 mg total) by mouth every 6 (six) hours as needed for muscle spasms.   OVER THE COUNTER MEDICATION Protegra Antioxidant   oxyCODONE-acetaminophen 5-325 MG tablet Commonly known as:  PERCOCET/ROXICET Take 1-2 tablets by mouth every 3 (three) hours as needed for moderate pain or severe pain.   oxymetazoline 0.05 % nasal spray Commonly known as:  AFRIN Place 2 sprays into the nose 2 (two) times daily as needed. Allergies   sodium chloride 0.65 % nasal spray Commonly known as:  OCEAN Place 1 spray into the nose as needed.   VITAMIN D-1000 MAX ST 25 MCG (1000 UT) tablet Generic drug:  Cholecalciferol Take 1,000 Units by mouth.   vitamin E 400 UNIT capsule Take 400 Units by mouth.  Durable Medical Equipment  (From admission, onward)         Start     Ordered   11/21/18 0908  For home use only DME Hospital bed  Once    Question Answer Comment  Patient has (list medical condition): Lumbar stenosis   The above medical condition requires: Patient requires the ability to reposition frequently   Head must be elevated greater than: 45 degrees   Bed type Semi-electric   Trapeze Bar Yes      11/21/18 0908         Follow-up Information    [facility].   Specialty:  [place] Why:  A  representative from [facility] will contact you to arrange start date and time for your therapy.  Contact information: [address] [place] [address] [phone]           Signed: Earleen Newport 11/22/2018, 9:32 AM

## 2018-11-22 NOTE — Progress Notes (Signed)
Pt and wife given D/C instructions with Rx's, verbal understanding was provided. Pt's incision is clean and dry with no sign of infection. Pt's IV was removed prior to D/C. Home Health was arranged by CM per MD order. Pt is stable @ D/C and has no other needs at this time. Holli Humbles, RN

## 2018-11-30 ENCOUNTER — Other Ambulatory Visit: Payer: Self-pay

## 2018-11-30 ENCOUNTER — Inpatient Hospital Stay (HOSPITAL_COMMUNITY)
Admission: EM | Admit: 2018-11-30 | Discharge: 2018-12-07 | DRG: 948 | Disposition: A | Discharge status: Skilled Nursing Facility | Payer: Medicare Other | Attending: Neurosurgery | Admitting: Neurosurgery

## 2018-11-30 ENCOUNTER — Encounter (HOSPITAL_COMMUNITY): Payer: Self-pay | Admitting: Emergency Medicine

## 2018-11-30 DIAGNOSIS — Z8261 Family history of arthritis: Secondary | ICD-10-CM | POA: Diagnosis not present

## 2018-11-30 DIAGNOSIS — M48061 Spinal stenosis, lumbar region without neurogenic claudication: Secondary | ICD-10-CM | POA: Diagnosis not present

## 2018-11-30 DIAGNOSIS — E785 Hyperlipidemia, unspecified: Secondary | ICD-10-CM | POA: Diagnosis present

## 2018-11-30 DIAGNOSIS — Z4789 Encounter for other orthopedic aftercare: Secondary | ICD-10-CM | POA: Diagnosis not present

## 2018-11-30 DIAGNOSIS — Z79899 Other long term (current) drug therapy: Secondary | ICD-10-CM | POA: Diagnosis not present

## 2018-11-30 DIAGNOSIS — I10 Essential (primary) hypertension: Secondary | ICD-10-CM | POA: Diagnosis not present

## 2018-11-30 DIAGNOSIS — R739 Hyperglycemia, unspecified: Secondary | ICD-10-CM | POA: Diagnosis present

## 2018-11-30 DIAGNOSIS — Z96642 Presence of left artificial hip joint: Secondary | ICD-10-CM | POA: Diagnosis present

## 2018-11-30 DIAGNOSIS — Z7401 Bed confinement status: Secondary | ICD-10-CM | POA: Diagnosis not present

## 2018-11-30 DIAGNOSIS — R32 Unspecified urinary incontinence: Secondary | ICD-10-CM | POA: Diagnosis present

## 2018-11-30 DIAGNOSIS — Z751 Person awaiting admission to adequate facility elsewhere: Secondary | ICD-10-CM

## 2018-11-30 DIAGNOSIS — Z9889 Other specified postprocedural states: Secondary | ICD-10-CM | POA: Diagnosis not present

## 2018-11-30 DIAGNOSIS — R531 Weakness: Secondary | ICD-10-CM | POA: Diagnosis not present

## 2018-11-30 DIAGNOSIS — N401 Enlarged prostate with lower urinary tract symptoms: Secondary | ICD-10-CM | POA: Diagnosis not present

## 2018-11-30 DIAGNOSIS — G8918 Other acute postprocedural pain: Secondary | ICD-10-CM | POA: Diagnosis not present

## 2018-11-30 DIAGNOSIS — Z801 Family history of malignant neoplasm of trachea, bronchus and lung: Secondary | ICD-10-CM

## 2018-11-30 DIAGNOSIS — E669 Obesity, unspecified: Secondary | ICD-10-CM | POA: Diagnosis present

## 2018-11-30 DIAGNOSIS — Z825 Family history of asthma and other chronic lower respiratory diseases: Secondary | ICD-10-CM

## 2018-11-30 DIAGNOSIS — R0902 Hypoxemia: Secondary | ICD-10-CM | POA: Diagnosis not present

## 2018-11-30 DIAGNOSIS — Z6833 Body mass index (BMI) 33.0-33.9, adult: Secondary | ICD-10-CM | POA: Diagnosis not present

## 2018-11-30 DIAGNOSIS — M2550 Pain in unspecified joint: Secondary | ICD-10-CM | POA: Diagnosis not present

## 2018-11-30 DIAGNOSIS — R338 Other retention of urine: Secondary | ICD-10-CM | POA: Diagnosis not present

## 2018-11-30 DIAGNOSIS — G629 Polyneuropathy, unspecified: Secondary | ICD-10-CM | POA: Diagnosis not present

## 2018-11-30 DIAGNOSIS — Z885 Allergy status to narcotic agent status: Secondary | ICD-10-CM | POA: Diagnosis not present

## 2018-11-30 DIAGNOSIS — M549 Dorsalgia, unspecified: Secondary | ICD-10-CM | POA: Diagnosis not present

## 2018-11-30 DIAGNOSIS — N400 Enlarged prostate without lower urinary tract symptoms: Secondary | ICD-10-CM | POA: Diagnosis not present

## 2018-11-30 DIAGNOSIS — Z79891 Long term (current) use of opiate analgesic: Secondary | ICD-10-CM | POA: Diagnosis not present

## 2018-11-30 DIAGNOSIS — M1990 Unspecified osteoarthritis, unspecified site: Secondary | ICD-10-CM | POA: Diagnosis not present

## 2018-11-30 DIAGNOSIS — M5106 Intervertebral disc disorders with myelopathy, lumbar region: Secondary | ICD-10-CM | POA: Diagnosis not present

## 2018-11-30 DIAGNOSIS — R159 Full incontinence of feces: Secondary | ICD-10-CM | POA: Diagnosis not present

## 2018-11-30 DIAGNOSIS — R52 Pain, unspecified: Secondary | ICD-10-CM | POA: Diagnosis not present

## 2018-11-30 LAB — CBC
HCT: 48.6 % (ref 39.0–52.0)
Hemoglobin: 15.9 g/dL (ref 13.0–17.0)
MCH: 31.9 pg (ref 26.0–34.0)
MCHC: 32.7 g/dL (ref 30.0–36.0)
MCV: 97.4 fL (ref 80.0–100.0)
Platelets: 229 10*3/uL (ref 150–400)
RBC: 4.99 MIL/uL (ref 4.22–5.81)
RDW: 12 % (ref 11.5–15.5)
WBC: 7.3 10*3/uL (ref 4.0–10.5)
nRBC: 0 % (ref 0.0–0.2)

## 2018-11-30 LAB — BASIC METABOLIC PANEL
Anion gap: 8 (ref 5–15)
BUN: 14 mg/dL (ref 8–23)
CO2: 27 mmol/L (ref 22–32)
Calcium: 8.9 mg/dL (ref 8.9–10.3)
Chloride: 104 mmol/L (ref 98–111)
Creatinine, Ser: 0.75 mg/dL (ref 0.61–1.24)
GFR calc Af Amer: >60 mL/min (ref >60)
GFR calc non Af Amer: >60 mL/min (ref >60)
Glucose, Bld: 122 mg/dL — ABNORMAL HIGH (ref 70–99)
Potassium: 4.2 mmol/L (ref 3.5–5.1)
Sodium: 139 mmol/L (ref 135–145)

## 2018-11-30 LAB — URINALYSIS, ROUTINE W REFLEX MICROSCOPIC
Bilirubin Urine: NEGATIVE
Glucose, UA: NEGATIVE mg/dL
Ketones, ur: NEGATIVE mg/dL
Nitrite: NEGATIVE
Protein, ur: 30 mg/dL — AB
Specific Gravity, Urine: 1.027 (ref 1.005–1.030)
pH: 5 (ref 5.0–8.0)

## 2018-11-30 LAB — MRSA PCR SCREENING: MRSA by PCR: NEGATIVE

## 2018-11-30 MED ORDER — ACETAMINOPHEN 325 MG PO TABS: 650.0000 mg | ORAL_TABLET | ORAL | Status: DC | PRN

## 2018-11-30 MED ORDER — PHENOL 1.4 % MT LIQD: 1.0000 | OROMUCOSAL | Status: DC | PRN

## 2018-11-30 MED ORDER — BISACODYL 10 MG RE SUPP: 10.0000 mg | Freq: Every day | RECTAL | Status: DC | PRN

## 2018-11-30 MED ORDER — SODIUM CHLORIDE 0.9% FLUSH
3.0000 mL | Freq: Two times a day (BID) | INTRAVENOUS | Status: DC
  Administered 2018-11-30 – 2018-12-07 (×12): 3 mL via INTRAVENOUS

## 2018-11-30 MED ORDER — HYDROXYZINE HCL 50 MG/ML IM SOLN
50.0000 mg | INTRAMUSCULAR | Status: DC | PRN
  Filled 2018-11-30: qty 1

## 2018-11-30 MED ORDER — BENAZEPRIL HCL 20 MG PO TABS
20.0000 mg | ORAL_TABLET | Freq: Every day | ORAL | Status: DC
  Administered 2018-11-30 – 2018-12-07 (×6): 20 mg via ORAL
  Filled 2018-11-30 (×8): qty 1

## 2018-11-30 MED ORDER — CHOLECALCIFEROL 25 MCG (1000 UT) PO TABS: 1000.0000 [IU] | ORAL_TABLET | Freq: Every morning | ORAL | Status: DC

## 2018-11-30 MED ORDER — VITAMIN D 25 MCG (1000 UNIT) PO TABS
1000.0000 [IU] | ORAL_TABLET | Freq: Every day | ORAL | Status: DC
  Administered 2018-11-30 – 2018-12-07 (×8): 1000 [IU] via ORAL
  Filled 2018-11-30 (×8): qty 1

## 2018-11-30 MED ORDER — CELECOXIB 200 MG PO CAPS
200.0000 mg | ORAL_CAPSULE | Freq: Every day | ORAL | Status: DC
  Administered 2018-11-30 – 2018-12-07 (×8): 200 mg via ORAL
  Filled 2018-11-30 (×8): qty 1

## 2018-11-30 MED ORDER — FLEET ENEMA 7-19 GM/118ML RE ENEM: 1.0000 | ENEMA | Freq: Once | RECTAL | Status: DC | PRN

## 2018-11-30 MED ORDER — METHOCARBAMOL 500 MG PO TABS
500.0000 mg | ORAL_TABLET | Freq: Four times a day (QID) | ORAL | Status: DC | PRN
  Administered 2018-11-30 – 2018-12-06 (×10): 500 mg via ORAL
  Filled 2018-11-30 (×10): qty 1

## 2018-11-30 MED ORDER — HYDROCODONE-ACETAMINOPHEN 5-325 MG PO TABS
1.0000 | ORAL_TABLET | ORAL | Status: DC | PRN
  Administered 2018-11-30: 1 via ORAL
  Administered 2018-11-30 – 2018-12-07 (×18): 2 via ORAL
  Filled 2018-11-30: qty 2
  Filled 2018-11-30: qty 1
  Filled 2018-11-30 (×11): qty 2
  Filled 2018-11-30: qty 1
  Filled 2018-11-30: qty 2
  Filled 2018-11-30: qty 1
  Filled 2018-11-30 (×4): qty 2

## 2018-11-30 MED ORDER — BENAZEPRIL-HYDROCHLOROTHIAZIDE 20-12.5 MG PO TABS: 0.5000 | ORAL_TABLET | Freq: Every day | ORAL | Status: DC

## 2018-11-30 MED ORDER — SODIUM CHLORIDE 0.9 % IV SOLN: 250.0000 mL | INTRAVENOUS | Status: DC

## 2018-11-30 MED ORDER — HYDROXYZINE HCL 25 MG PO TABS
50.0000 mg | ORAL_TABLET | ORAL | Status: DC | PRN
  Administered 2018-12-01: 50 mg via ORAL
  Filled 2018-11-30: qty 2

## 2018-11-30 MED ORDER — MENTHOL 3 MG MT LOZG: 1.0000 | LOZENGE | OROMUCOSAL | Status: DC | PRN

## 2018-11-30 MED ORDER — CEPHALEXIN 500 MG PO CAPS
500.0000 mg | ORAL_CAPSULE | Freq: Once | ORAL | Status: DC
  Filled 2018-11-30: qty 1

## 2018-11-30 MED ORDER — GABAPENTIN 100 MG PO CAPS
200.0000 mg | ORAL_CAPSULE | Freq: Every day | ORAL | Status: DC
  Administered 2018-11-30 – 2018-12-06 (×7): 200 mg via ORAL
  Filled 2018-11-30 (×7): qty 2

## 2018-11-30 MED ORDER — ALUM & MAG HYDROXIDE-SIMETH 200-200-20 MG/5ML PO SUSP: 30.0000 mL | Freq: Four times a day (QID) | ORAL | Status: DC | PRN

## 2018-11-30 MED ORDER — MAGNESIUM HYDROXIDE 400 MG/5ML PO SUSP: 30.0000 mL | Freq: Every day | ORAL | Status: DC | PRN

## 2018-11-30 MED ORDER — VITAMIN B-12 1000 MCG PO TABS
1000.0000 ug | ORAL_TABLET | Freq: Every day | ORAL | Status: DC
  Administered 2018-11-30 – 2018-12-07 (×8): 1000 ug via ORAL
  Filled 2018-11-30 (×8): qty 1

## 2018-11-30 MED ORDER — ACETAMINOPHEN 650 MG RE SUPP: 650.0000 mg | RECTAL | Status: DC | PRN

## 2018-11-30 MED ORDER — SODIUM CHLORIDE 0.9% FLUSH: 3.0000 mL | INTRAVENOUS | Status: DC | PRN

## 2018-11-30 MED ORDER — HYDROMORPHONE HCL 1 MG/ML IJ SOLN
0.5000 mg | INTRAMUSCULAR | Status: DC | PRN
  Administered 2018-11-30: 0.5 mg via INTRAVENOUS
  Filled 2018-11-30: qty 1

## 2018-11-30 MED ORDER — ONDANSETRON HCL 4 MG/2ML IJ SOLN
4.0000 mg | Freq: Once | INTRAMUSCULAR | Status: AC
  Administered 2018-11-30: 4 mg via INTRAVENOUS
  Filled 2018-11-30: qty 2

## 2018-11-30 MED ORDER — MORPHINE SULFATE (PF) 4 MG/ML IV SOLN: 4.0000 mg | INTRAVENOUS | Status: DC | PRN

## 2018-11-30 MED ORDER — TAMSULOSIN HCL 0.4 MG PO CAPS
0.4000 mg | ORAL_CAPSULE | Freq: Every day | ORAL | Status: DC
  Administered 2018-11-30 – 2018-12-07 (×8): 0.4 mg via ORAL
  Filled 2018-11-30 (×8): qty 1

## 2018-11-30 MED ORDER — HEPARIN SODIUM (PORCINE) 5000 UNIT/ML IJ SOLN
5000.0000 [IU] | Freq: Two times a day (BID) | INTRAMUSCULAR | Status: DC
  Administered 2018-11-30 – 2018-12-07 (×14): 5000 [IU] via SUBCUTANEOUS
  Filled 2018-11-30 (×14): qty 1

## 2018-11-30 MED ORDER — HYDROCHLOROTHIAZIDE 12.5 MG PO CAPS
12.5000 mg | ORAL_CAPSULE | Freq: Every day | ORAL | Status: DC
  Administered 2018-11-30 – 2018-12-07 (×6): 12.5 mg via ORAL
  Filled 2018-11-30 (×8): qty 1

## 2018-11-30 NOTE — ED Triage Notes (Addendum)
Pt in from home via [facility] with post-op problems, had T12-L1 "clean-up" and fixed his spinal stenosis on 12/17. Pt states he is having increased B leg weakness, and urinary and bowel incontinence. A&ox4, c/o low back pain, denies fevers. Has attempted calling his surgeon, Dr. Clarice Pole office x 3 but had no luck

## 2018-11-30 NOTE — H&P (Signed)
Subjective: Patient is a 77 y.o. white male, patient of Dr. Kristeen Miss, who is admitted for treatment of postoperative pain and immobility.  Dr. Ellene Route performed bilateral T12-L1 laminotomies 10 days ago, and the patient was discharged home on the second postoperative day.  He and his wife, as well as his daughter, note that he has had continued difficulties with immobility.  This is led to bowel accidents yesterday and again today, where he sensed that he needed to use the bathroom, but was unable to mobilize successfully to the bathroom.  Symptomatically he complains of pain in the buttocks as well as in the proximal thighs bilaterally.  In speaking with the patient and his family, they explained that he did not continue his Celebrex (which he has been taking for many years) after returning home following surgery, since he had been on pain medication.  He, his wife, and his daughter feel that he has become more than the family can care for their home, and they need assistance with his pain management and rehabilitation.   Patient Active Problem List   Diagnosis Date Noted  . Myelopathy concurrent with and due to stenosis of lumbar spine ([place]) 11/20/2018  . Microalbuminuria 09/02/2016  . HLD (hyperlipidemia) 09/02/2016  . Spinal stenosis   . Other fatigue 08/24/2016  . HTN (hypertension) 08/24/2016  . BPH (benign prostatic hyperplasia) 08/24/2016  . Nocturia 08/24/2016  . Advance care planning 08/24/2016  . S/P Left THA, AA 03/20/2012   Past Medical History:  Diagnosis Date  . Arthritis   . BPH (benign prostatic hyperplasia)    nocturia x4 at baseline  . H/O hiatal hernia   . Hemorrhoids   . Hyperlipidemia   . Hypertension    Dr Osborne Casco- LOV with clearance 3/13 on chart  . IGT (impaired glucose tolerance)    last AIC 5.5- diet controlled- per office note 3/13  . Seasonal allergies   . Spinal stenosis   . UTI (lower urinary tract infection)    required admission to [facility]    Past  Surgical History:  Procedure Laterality Date  . BACK SURGERY  2008   Laminectomy L4-5  . JOINT REPLACEMENT Bilateral    bil  . KNEE ARTHROSCOPY     right  . LUMBAR LAMINECTOMY/DECOMPRESSION MICRODISCECTOMY Bilateral 11/20/2018   Procedure: Bilateral Thoracic Twelve to Lumbar One Laminectomy;  Surgeon: Kristeen Miss, MD;  Location: [facility];  Service: Neurosurgery;  Laterality: Bilateral;  posterior  . TOTAL HIP ARTHROPLASTY  03/20/2012   Procedure: TOTAL HIP ARTHROPLASTY ANTERIOR APPROACH;  Surgeon: Mauri Pole, MD;  Location: [facility];  Service: Orthopedics;  Laterality: Left;    (Not in a hospital admission)  Allergies  Allergen Reactions  . Codeine Other (See Comments)    Can't remember. UNSPECIFIED REACTION     Social History   Tobacco Use  . Smoking status: Never Smoker  . Smokeless tobacco: Never Used  Substance Use Topics  . Alcohol use: Yes    Comment: socially, wine    Family History  Problem Relation Age of Onset  . Arthritis Mother   . Lung cancer Father   . Asthma Father   . Colon cancer Neg Hx   . Prostate cancer Neg Hx      Review of Systems Pertinent items noted in HPI and remainder of comprehensive ROS otherwise negative.  Objective: Vital signs in last 24 hours: Pulse Rate:  [71-80] 76 (12/27 1430) Resp:  [14-24] 18 (12/27 1530) BP: (127-164)/(67-89) 149/89 (12/27 1530)  SpO2:  [93 %-97 %] 95 % (12/27 1430) Weight:  [109.3 kg] 109.3 kg (12/27 1127)  EXAM: Patient is a obese white male in no acute distress.   Lungs are clear to auscultation , the patient has symmetrical respiratory excursion. Heart has a regular rate and rhythm normal S1 and S2 no murmur.   Abdomen is soft nontender nondistended bowel sounds are present. Extremity examination shows no clubbing or cyanosis, and only mild edema at the ankles.  The wound is clean and dry, the Dermabond is intact; there is no swelling, ecchymosis, erythema, or swelling. Motor examination shows 5 over 5  strength in the lower extremities including the iliopsoas quadriceps dorsiflexor extensor hallicus  longus and plantar flexor bilaterally. Sensation is intact to pinprick in the distal lower extremities. Reflexes are absent bilaterally at the quadriceps and gastrocnemius.  Gait and stance were not tested due to his condition.  Data Review:CBC    Component Value Date/Time   WBC 7.3 11/30/2018 1127   RBC 4.99 11/30/2018 1127   HGB 15.9 11/30/2018 1127   HGB 14.1 06/12/2013 0409   HCT 48.6 11/30/2018 1127   HCT 40.5 06/12/2013 0409   PLT 229 11/30/2018 1127   PLT 138 (L) 06/12/2013 0409   MCV 97.4 11/30/2018 1127   MCV 94 06/12/2013 0409   MCH 31.9 11/30/2018 1127   MCHC 32.7 11/30/2018 1127   RDW 12.0 11/30/2018 1127   RDW 13.7 06/12/2013 0409   LYMPHSABS 1.5 08/23/2016 1606   LYMPHSABS 0.6 (L) 06/12/2013 0409   MONOABS 0.7 08/23/2016 1606   MONOABS 1.0 06/12/2013 0409   EOSABS 0.2 08/23/2016 1606   EOSABS 0.0 06/12/2013 0409   BASOSABS 0.0 08/23/2016 1606   BASOSABS 0.0 06/12/2013 0409                          BMET    Component Value Date/Time   NA 139 11/30/2018 1127   NA 143 06/11/2013 1519   K 4.2 11/30/2018 1127   K 3.4 (L) 06/12/2013 0409   CL 104 11/30/2018 1127   CL 106 06/11/2013 1519   CO2 27 11/30/2018 1127   CO2 27 06/11/2013 1519   GLUCOSE 122 (H) 11/30/2018 1127   GLUCOSE 127 (H) 06/11/2013 1519   BUN 14 11/30/2018 1127   BUN 15 06/11/2013 1519   CREATININE 0.75 11/30/2018 1127   CREATININE 0.8 08/25/2016   CREATININE 1.03 06/11/2013 1519   CALCIUM 8.9 11/30/2018 1127   CALCIUM 8.5 06/11/2013 1519   GFRNONAA >60 11/30/2018 1127   GFRNONAA >60 06/11/2013 1519   GFRAA >60 11/30/2018 1127   GFRAA >60 06/11/2013 1519     Assessment/Plan: Patient 10 days status post bilateral T12-L1 laminotomies by Dr. Kristeen Miss, who is had increasing difficulties with mobility and care at home.  One contributing factor may well have been that his Celebrex was not  continued following surgery.  His examination shows good strength and sensation in the lower extremities.  His mobility has been sufficiently limited that he has had bowel accidents yesterday and today.  He is admitted now for pain management as well as rehabilitation, and requests for physical therapy and occupational therapy have been place.  Ultimately he may well require either inpatient rehabilitation here at [facility] or rehabilitation at a skilled nursing facility (the patient's wife's preference would be [facility]).   Hosie Spangle, MD 11/30/2018 4:16 PM

## 2018-11-30 NOTE — ED Provider Notes (Signed)
[facility] EMERGENCY DEPARTMENT Provider Note   CSN: [id number] Arrival date & time: 11/30/18  1115     History   Chief Complaint Chief Complaint  Patient presents with  . Post-op Problem  . Weakness    HPI Todd Gonzales is a 77 y.o. male.  HPI Pt presents to the ED for complaints of back pain and weakness.  Pt states he had surgery on t12-l1 for spinal stenosis on 12/17.  Pt states starting a few days after the surgery he has had increasing weakness in his legs.  He has had increasing pain in his back going down his legs.  He has had urinary and bowel incontinence although the bowel incontinence was after laxative use.  Patient tried calling his neurosurgeons office but did not get a call back.  Patient symptoms were too severe today so he came to the ED.  Patient was transferred by EMS.  They indicated the patient was able to stand and bear weight but he required significant assistance. Past Medical History:  Diagnosis Date  . Arthritis   . BPH (benign prostatic hyperplasia)    nocturia x4 at baseline  . H/O hiatal hernia   . Hemorrhoids   . Hyperlipidemia   . Hypertension    Dr Osborne Casco- LOV with clearance 3/13 on chart  . IGT (impaired glucose tolerance)    last AIC 5.5- diet controlled- per office note 3/13  . Seasonal allergies   . Spinal stenosis   . UTI (lower urinary tract infection)    required admission to [facility]    Patient Active Problem List   Diagnosis Date Noted  . Myelopathy concurrent with and due to stenosis of lumbar spine ([place]) 11/20/2018  . Microalbuminuria 09/02/2016  . HLD (hyperlipidemia) 09/02/2016  . Spinal stenosis   . Other fatigue 08/24/2016  . HTN (hypertension) 08/24/2016  . BPH (benign prostatic hyperplasia) 08/24/2016  . Nocturia 08/24/2016  . Advance care planning 08/24/2016  . S/P Left THA, AA 03/20/2012    Past Surgical History:  Procedure Laterality Date  . BACK SURGERY  2008   Laminectomy L4-5  . JOINT  REPLACEMENT Bilateral    bil  . KNEE ARTHROSCOPY     right  . LUMBAR LAMINECTOMY/DECOMPRESSION MICRODISCECTOMY Bilateral 11/20/2018   Procedure: Bilateral Thoracic Twelve to Lumbar One Laminectomy;  Surgeon: Kristeen Miss, MD;  Location: [facility];  Service: Neurosurgery;  Laterality: Bilateral;  posterior  . TOTAL HIP ARTHROPLASTY  03/20/2012   Procedure: TOTAL HIP ARTHROPLASTY ANTERIOR APPROACH;  Surgeon: Mauri Pole, MD;  Location: [facility];  Service: Orthopedics;  Laterality: Left;        Home Medications    Prior to Admission medications   Medication Sig Start Date End Date Taking? Authorizing Provider  amoxicillin (AMOXIL) 500 MG tablet Take 500 mg by mouth. As needed for dental work.    [provider]  benazepril-hydrochlorthiazide (LOTENSIN HCT) 20-12.5 MG tablet Take 0.5 tablets by mouth daily after breakfast. 08/25/16   Tonia Ghent, MD  celecoxib (CELEBREX) 200 MG capsule Take 200 mg by mouth daily.    [provider]  Cholecalciferol (VITAMIN D-1000 MAX ST) 1000 UNITS tablet Take 1,000 Units by mouth.    [provider]  cyanocobalamin 1000 MCG tablet Take 1,000 mcg by mouth daily.    [provider]  dexamethasone (DECADRON) 1 MG tablet 2 tablets twice daily for 2 days, one tablet twice daily for 2 days, one tablet daily for 2 days.  11/22/18   Kristeen Miss, MD  diclofenac sodium (VOLTAREN) 1 % GEL Apply topically 4 (four) times daily.    [provider]  gabapentin (NEURONTIN) 100 MG capsule Take 200 mg by mouth at bedtime.     [provider]  ibuprofen (ADVIL,MOTRIN) 200 MG tablet Take 200 mg by mouth as needed for moderate pain.    [provider]  Menthol, Topical Analgesic, (BIOFREEZE) 10 % LIQD Apply 1 application topically 2 (two) times daily as needed. Pain     [provider]  methocarbamol (ROBAXIN) 500 MG tablet Take 1 tablet (500 mg total) by mouth every 6 (six) hours as needed for muscle  spasms. 11/22/18   Kristeen Miss, MD  OVER THE COUNTER MEDICATION [place] Antioxidant    [provider]  oxyCODONE-acetaminophen (PERCOCET/ROXICET) 5-325 MG tablet Take 1-2 tablets by mouth every 3 (three) hours as needed for moderate pain or severe pain. 11/22/18   Kristeen Miss, MD  oxymetazoline (AFRIN) 0.05 % nasal spray Place 2 sprays into the nose 2 (two) times daily as needed. Allergies     [provider]  sodium chloride (OCEAN) 0.65 % nasal spray Place 1 spray into the nose as needed.    [provider]  tamsulosin (FLOMAX) 0.4 MG CAPS capsule Take 0.4 mg by mouth.    [provider]  vitamin E 400 UNIT capsule Take 400 Units by mouth.    [provider]    Family History Family History  Problem Relation Age of Onset  . Arthritis Mother   . Lung cancer Father   . Asthma Father   . Colon cancer Neg Hx   . Prostate cancer Neg Hx     Social History Social History   Tobacco Use  . Smoking status: Never Smoker  . Smokeless tobacco: Never Used  Substance Use Topics  . Alcohol use: Yes    Comment: socially, wine  . Drug use: No     Allergies   Codeine   Review of Systems Review of Systems  All other systems reviewed and are negative.    Physical Exam Updated Vital Signs BP (!) 149/89   Pulse 76   Resp 18   Wt 109.3 kg   SpO2 95%   BMI 33.61 kg/m   Physical Exam Vitals signs and nursing note reviewed.  Constitutional:      General: He is not in acute distress.    Appearance: He is well-developed.  HENT:     Head: Normocephalic and atraumatic.     Right Ear: External ear normal.     Left Ear: External ear normal.  Eyes:     General: No scleral icterus.       Right eye: No discharge.        Left eye: No discharge.     Conjunctiva/sclera: Conjunctivae normal.  Neck:     Musculoskeletal: Neck supple.     Trachea: No tracheal deviation.  Cardiovascular:     Rate and Rhythm: Normal rate and regular  rhythm.  Pulmonary:     Effort: Pulmonary effort is normal. No respiratory distress.     Breath sounds: Normal breath sounds. No stridor. No wheezing or rales.  Abdominal:     General: Bowel sounds are normal. There is no distension.     Palpations: Abdomen is soft.     Tenderness: There is no abdominal tenderness. There is no guarding or rebound.  Genitourinary:    Rectum: Normal anal tone.  Comments: Normal rectal tone, no fecal impaction Musculoskeletal:        General: No tenderness.     Comments: Pain with trying to sit up or rollover  Skin:    General: Skin is warm and dry.     Findings: No rash.  Neurological:     Mental Status: He is alert.     Cranial Nerves: No cranial nerve deficit (no facial droop, extraocular movements intact, no slurred speech).     Sensory: No sensory deficit.     Motor: No abnormal muscle tone or seizure activity.     Coordination: Coordination normal.     Comments: Normal sensation bilateral lower extremity, patient is able to lift both legs off the bed, normal plantar flexion dorsiflexion      ED Treatments / Results  Labs (all labs ordered are listed, but only abnormal results are displayed) Labs Reviewed  BASIC METABOLIC PANEL - Abnormal; Notable for the following components:      Result Value   Glucose, Bld 122 (*)    All other components within normal limits  URINALYSIS, ROUTINE W REFLEX MICROSCOPIC - Abnormal; Notable for the following components:   Hgb urine dipstick SMALL (*)    Protein, ur 30 (*)    Leukocytes, UA TRACE (*)    Bacteria, UA FEW (*)    All other components within normal limits  URINE CULTURE  CBC     Procedures Procedures (including critical care time)  Medications Ordered in ED Medications  HYDROmorphone (DILAUDID) injection 0.5 mg (0.5 mg Intravenous Given 11/30/18 1152)  cephALEXin (KEFLEX) capsule 500 mg (has no administration in time range)  ondansetron (ZOFRAN) injection 4 mg (4 mg Intravenous  Given 11/30/18 1152)     Initial Impression / Assessment and Plan / ED Course  I have reviewed the triage vital signs and the nursing notes.  Pertinent labs & imaging results that were available during my care of the patient were reviewed by me and considered in my medical decision making (see chart for details).  Clinical Course as of Dec 01 1535  Fri Nov 30, 2018  1159 D/w Dr Sherwood Gambler.  Does not need MRI at this time.  Will check labs, assess for urinary retention.   [TI]  [id number] labs with family.  ?uti.  Will give dose of oral abx.  Pt is requiring increasing assistance.   Do not feel that he can go home.  Will call back Dr Sherwood Gambler   [JK]    Clinical Course User Index [JK] Dorie Rank, MD    Pt presents with increasing weakness, back pain.  No focal deficit on my exam but pt does have pain walking, feels unsteady. No urinary retention noted.  No decreased rectal tone.  Possible UTI.  Discussed with Dr Sherwood Gambler who will come see the patient in the ED.   Final Clinical Impressions(s) / ED Diagnoses   Final diagnoses:  Post-op pain  Incontinence of feces, unspecified fecal incontinence type       Dorie Rank, MD 11/30/18 1537

## 2018-12-01 DIAGNOSIS — G8918 Other acute postprocedural pain: Secondary | ICD-10-CM

## 2018-12-01 DIAGNOSIS — R159 Full incontinence of feces: Secondary | ICD-10-CM

## 2018-12-01 DIAGNOSIS — Z9889 Other specified postprocedural states: Secondary | ICD-10-CM

## 2018-12-01 DIAGNOSIS — R739 Hyperglycemia, unspecified: Secondary | ICD-10-CM

## 2018-12-01 NOTE — Consult Note (Addendum)
Physical Medicine and Rehabilitation Consult Reason for Consult: Gait instability Referring Physician: Traci Sermon, PA-C  HPI: Todd Gonzales is a 77 y.o. male with past medical history of spinal stenosis, hypertension, BPH, arthritis presented on 11/30/2018 for pain management and gait instability.  History taken from chart review and patient.  Patient noted bilateral lower extremity weakness for over 1 year in duration.  Previous MRI reviewed showing multilevel degenerative disease thoracolumbar spine.  His symptoms began to get worse with neurogenic claudication and he presented to neurosurgery. Dr. Ellene Route performed decompressive laminectomy T12-L1 on 11/16/2018 for lumbar stenosis with myelopathy.  Postoperatively he had persistent pain with bowel and bladder incontinence.  His family believes that they cannot care for him at home.  Hospital course further complicated by pain and hyperglycemia.  Review of Systems  Constitutional: Negative for chills, fever and malaise/fatigue.  Eyes: Negative for blurred vision and double vision.  Respiratory: Negative for shortness of breath.   Cardiovascular: Negative for chest pain.  Gastrointestinal:       ?  Incontinence-?  Improved  Genitourinary: Positive for frequency.  Musculoskeletal: Positive for back pain, joint pain and myalgias.  Neurological: Positive for weakness. Negative for dizziness and headaches.  All other systems reviewed and are negative.  Past Medical History:  Diagnosis Date  . Arthritis   . BPH (benign prostatic hyperplasia)    nocturia x4 at baseline  . H/O hiatal hernia   . Hemorrhoids   . Hyperlipidemia   . Hypertension    Dr Osborne Casco- LOV with clearance 3/13 on chart  . IGT (impaired glucose tolerance)    last AIC 5.5- diet controlled- per office note 3/13  . Seasonal allergies   . Spinal stenosis   . UTI (lower urinary tract infection)    required admission to [facility]   Past Surgical History:    Procedure Laterality Date  . BACK SURGERY  2008   Laminectomy L4-5  . JOINT REPLACEMENT Bilateral    bil  . KNEE ARTHROSCOPY     right  . LUMBAR LAMINECTOMY/DECOMPRESSION MICRODISCECTOMY Bilateral 11/20/2018   Procedure: Bilateral Thoracic Twelve to Lumbar One Laminectomy;  Surgeon: Kristeen Miss, MD;  Location: [facility];  Service: Neurosurgery;  Laterality: Bilateral;  posterior  . TOTAL HIP ARTHROPLASTY  03/20/2012   Procedure: TOTAL HIP ARTHROPLASTY ANTERIOR APPROACH;  Surgeon: Mauri Pole, MD;  Location: [facility];  Service: Orthopedics;  Laterality: Left;   Family History  Problem Relation Age of Onset  . Arthritis Mother   . Lung cancer Father   . Asthma Father   . Colon cancer Neg Hx   . Prostate cancer Neg Hx    Social History:  reports that he has never smoked. He has never used smokeless tobacco. He reports current alcohol use. He reports that he does not use drugs. Allergies:  Allergies  Allergen Reactions  . Codeine Other (See Comments)    Unknown reaction   Medications Prior to Admission  Medication Sig Dispense Refill  . amoxicillin (AMOXIL) 500 MG tablet Take 2,000 mg by mouth See admin instructions. Take 4 tablets (2000 mg) by mouth one hour before dental appoinments    . diclofenac sodium (VOLTAREN) 1 % GEL Apply 1 application topically 4 (four) times daily as needed (pain/cramps).     . gabapentin (NEURONTIN) 100 MG capsule Take 200 mg by mouth at bedtime.     Marland Kitchen ibuprofen (ADVIL,MOTRIN) 200 MG tablet Take 200 mg by mouth every 6 (six)  hours as needed (pain).     . methocarbamol (ROBAXIN) 500 MG tablet Take 1 tablet (500 mg total) by mouth every 6 (six) hours as needed for muscle spasms. 40 tablet 3  . oxyCODONE-acetaminophen (PERCOCET/ROXICET) 5-325 MG tablet Take 1-2 tablets by mouth every 3 (three) hours as needed for moderate pain or severe pain. (Patient taking differently: Take 1-2 tablets by mouth every 6 (six) hours as needed for moderate pain or severe pain.  ) 60 tablet 0  . oxymetazoline (AFRIN) 0.05 % nasal spray Place 2 sprays into the nose at bedtime as needed for congestion (allergies).     . sodium chloride (OCEAN) 0.65 % nasal spray Place 1 spray into the nose daily as needed for congestion.     . tamsulosin (FLOMAX) 0.4 MG CAPS capsule Take 0.4 mg by mouth at bedtime.     . traMADol (ULTRAM) 50 MG tablet Take 50 mg by mouth every 6 (six) hours as needed (pain).     . benazepril-hydrochlorthiazide (LOTENSIN HCT) 20-12.5 MG tablet Take 0.5 tablets by mouth daily after breakfast. (Patient taking differently: Take 0.5 tablets by mouth every other day. )    . celecoxib (CELEBREX) 200 MG capsule Take 200 mg by mouth 2 (two) times daily.     . Cholecalciferol (VITAMIN D-1000 MAX ST) 1000 UNITS tablet Take 1,000 Units by mouth.    . dexamethasone (DECADRON) 1 MG tablet 2 tablets twice daily for 2 days, one tablet twice daily for 2 days, one tablet daily for 2 days. (Patient taking differently: Take 1-2 mg by mouth See admin instructions. Tapered course started 11/22/18 pm: take 2 tablets (2 mg) by mouth twice daily for 2 days, then take 1 tablet (1 mg) twice daily for 2 days, then take 1 tablet (1 mg) daily for 2 days, then stop) 15 tablet 0  . vitamin E 400 UNIT capsule Take 400 Units by mouth.      Home: Home Living Family/patient expects to be discharged to:: Private residence Living Arrangements: Spouse/significant other Available Help at Discharge: Family, Available 24 hours/day Type of Home: House Home Access: Level entry [place]: One level Bathroom Shower/Tub: [place]: Environmental consultant - 2 wheels, [place] - single point, Hospital bed, Hand held shower head, Adaptive equipment, Grab bars - tub/shower, Shower seat - built in [employer]: Reacher, Sock aid, Long-handled sponge Additional Comments: however pt desires to go to rehab  Functional History: Prior Function Level of  Independence: Needs assistance Gait / Transfers Assistance Needed: requires assist using RW since back surgery, prior to surgery was independnet  ADL's / Homemaking Assistance Needed: requires assist with ADLs since back surgery, prior to surgery independent using AE  Comments: pt reports he couldn't ambulate at home that he was only standing up about 3x/day Functional Status:  Mobility: Bed Mobility Overal bed mobility: Needs Assistance Bed Mobility: Rolling, Sidelying to Sit Rolling: Supervision Sidelying to sit: Min assist General bed mobility comments: with verbal cues pt able to roll to the R using handrail, minA for trunk elevation up to EOB Transfers Overall transfer level: Needs assistance Equipment used: Rolling walker (2 wheeled) Transfers: Sit to/from Stand Sit to Stand: Min assist General transfer comment: pt asked for bed to elevated significantly, minA to steady during transition of hands from bed to RW Ambulation/Gait Ambulation/Gait assistance: Min guard Gait Distance (Feet): 20 Feet Assistive device: Rolling walker (2 wheeled) Gait Pattern/deviations: Step-through pattern, Decreased stride length, Trunk flexed General Gait  Details: pt with onset of increased pain requiring to turn around at door, no physical assist needed Gait velocity: Decreased Gait velocity interpretation: <1.31 ft/sec, indicative of household ambulator    ADL: ADL Overall ADL's : Needs assistance/impaired Grooming: Min guard, Standing Grooming Details (indicate cue type and reason): washing hands standing at sink, min guard for safety and balance  Upper Body Bathing: Supervision/ safety, Sitting Lower Body Bathing: Moderate assistance, Sit to/from stand Upper Body Dressing : Set up, Sitting Lower Body Dressing: Moderate assistance, Sit to/from stand Lower Body Dressing Details (indicate cue type and reason): educated on compensatory techniques, pt uses AE and unable to complete figure 4  technique Toilet Transfer: Minimal assistance, Ambulation, BSC, RW, Regular Toilet Toilet Transfer Details (indicate cue type and reason): 3:1 over toilet  Toileting- Clothing Manipulation and Hygiene: Minimal assistance, Sit to/from stand, Cueing for back precautions, Cueing for compensatory techniques Functional mobility during ADLs: Minimal assistance, Rolling walker General ADL Comments: pt with improved pain control since admission, min assist for mobility with min cueing for back preacutions and compensatory techniques  Cognition: Cognition Overall Cognitive Status: Within Functional Limits for tasks assessed Orientation Level: Oriented X4 Cognition Arousal/Alertness: Awake/alert Behavior During Therapy: WFL for tasks assessed/performed Overall Cognitive Status: Within Functional Limits for tasks assessed  Blood pressure (!) 62/34, pulse 84, temperature 98.7 F (37.1 C), temperature source Oral, resp. rate 19, weight 109.3 kg, SpO2 94 %. Physical Exam  Constitutional: He appears well-developed.  Obese  HENT:  Head: Normocephalic and atraumatic.  Eyes: EOM are normal. Right eye exhibits no discharge. Left eye exhibits no discharge.  Neck: Normal range of motion. Neck supple.  Cardiovascular: Normal rate and regular rhythm.  Respiratory: Effort normal and breath sounds normal.  GI: Soft. Bowel sounds are normal.  Musculoskeletal:     Comments: No edema or tenderness in extremities  Neurological: He is alert.  Motor: Bilateral upper extremities: 5/5 proximal distal Bilateral lower extremities: Hip flexion, knee extension 3+/5, ankle dorsiflexion 4+/5  Skin: Skin is warm and dry.  Psychiatric: He has a normal mood and affect. His behavior is normal.    Results for orders placed or performed during the hospital encounter of 11/30/18 (from the past 24 hour(s))  MRSA PCR Screening     Status: None   Collection Time: 11/30/18  5:38 PM  Result Value Ref Range   MRSA by PCR  NEGATIVE NEGATIVE   No results found.  Assessment/Plan: Diagnosis: Debility after recent thoracolumbar decompression Labs and images (see above) independently reviewed.  Records reviewed and summated above.  1. Does the need for close, 24 hr/day medical supervision in concert with the patient's rehab needs make it unreasonable for this patient to be served in a less intensive setting? No  2. Co-Morbidities requiring supervision/potential complications: Postoperative pain (Biofeedback training with therapies to help reduce reliance on opiate pain medications, monitor pain control during therapies, and sedation at rest and titrate to maximum efficacy to ensure participation and gains in therapies), hyperglycemia (Monitor in accordance with exercise and adjust meds as necessary)  3. Due to bladder management, bowel management, safety, skin/wound care, disease management, pain management and patient education, does the patient require 24 hr/day rehab nursing? Yes 4. Does the patient require coordinated care of a physician, rehab nurse, PT (1-2 hrs/day, 5 days/week) and OT (1-2 hrs/day, 5 days/week) to address physical and functional deficits in the context of the above medical diagnosis(es)? Yes Addressing deficits in the following areas: balance, endurance, locomotion, strength, transferring,  bowel/bladder control, bathing, dressing, toileting and psychosocial support 5. Can the patient actively participate in an intensive therapy program of at least 3 hrs of therapy per day at least 5 days per week? Yes 6. The potential for patient to make measurable gains while on inpatient rehab is good 7. Anticipated functional outcomes upon discharge from inpatient rehab are supervision and min assist  with PT, supervision and min assist with OT, n/a with SLP. 8. Estimated rehab length of stay to reach the above functional goals is: 5-9 days. 9. Anticipated D/C setting: Other 10. Anticipated post D/C treatments:  SNF 11. Overall Rehab/Functional Prognosis: good  RECOMMENDATIONS: This patient's condition is appropriate for continued rehabilitative care in the following setting: Patient doing fairly well functionally and will continue to require assistance after discharge.  Family feels that he cannot provide care for him recommend SNF.  Further patient does not have a medical need toward IRF. Patient has agreed to participate in recommended program. Potentially Note that insurance prior authorization may be required for reimbursement for recommended care.  Comment: Rehab Admissions Coordinator to follow up.   Delice Lesch, MD, Maxine Glenn 12/01/2018

## 2018-12-01 NOTE — Progress Notes (Addendum)
12:57PM: CSW notes PT recommendation for CIR. CSW signing off.   CSW received consult. CSW notes occupational therapy recommended CIR which would be arranged through [facility]. CSW will continue to follow if updated for SNF placement.   Lamonte Richer, LCSW, [place] Worker II [phone]

## 2018-12-01 NOTE — Evaluation (Signed)
Physical Therapy Evaluation Patient Details Name: Todd Gonzales MRN: [id number] DOB: Jan 13, 1941 Today's Date: 12/01/2018   History of Present Illness  Pt is a 77 y/o male admitted 11/30/18 for treatment of postoperative pain and immobility after B T12-L1 laminotomies 10 days ago with increased difficulty with mobility, self care. PMH: arthritis, hypertension,Spinal stenosis, and UTI.   Clinical Impression  Pt admitted with above. Per patient, patient was unable to ambulate at home due to pain and was having urinary and bowel incontinence due to inability to get to bathroom. Pt mobilized well today with minA however wife unable to provide this level of assist. Pt to benefit from CIR upon d/c to achieve safe supervision level of mobility to allow for safe transition home with wife.    Follow Up Recommendations CIR    Equipment Recommendations  None recommended by PT    Recommendations for Other Services       Precautions / Restrictions Precautions Precautions: Fall;Back Precaution Booklet Issued: No Precaution Comments: patient able to recall 3/3 back precautions with independence  Restrictions Weight Bearing Restrictions: No      Mobility  Bed Mobility Overal bed mobility: Needs Assistance Bed Mobility: Rolling;Sidelying to Sit Rolling: Supervision Sidelying to sit: Min assist       General bed mobility comments: with verbal cues pt able to roll to the R using handrail, minA for trunk elevation up to EOB  Transfers Overall transfer level: Needs assistance Equipment used: Rolling walker (2 wheeled) Transfers: Sit to/from Stand Sit to Stand: Min assist         General transfer comment: pt asked for bed to elevated significantly, minA to steady during transition of hands from bed to RW  Ambulation/Gait Ambulation/Gait assistance: Min guard Gait Distance (Feet): 20 Feet Assistive device: Rolling walker (2 wheeled) Gait Pattern/deviations: Step-through  pattern;Decreased stride length;Trunk flexed Gait velocity: Decreased Gait velocity interpretation: <1.31 ft/sec, indicative of household ambulator General Gait Details: pt with onset of increased pain requiring to turn around at door, no physical assist needed  Stairs            Wheelchair Mobility    Modified Rankin (Stroke Patients Only)       Balance Overall balance assessment: Needs assistance Sitting-balance support: No upper extremity supported;Feet supported Sitting balance-Leahy Scale: Good     Standing balance support: No upper extremity supported Standing balance-Leahy Scale: Fair Standing balance comment: dependent on RW for safe amb                             Pertinent Vitals/Pain Pain Assessment: 0-10 Pain Score: 6  Faces Pain Scale: Hurts a little bit Pain Location: Back with movement, none when lying down Pain Descriptors / Indicators: Discomfort;Sore Pain Intervention(s): Monitored during session    Home Living Family/patient expects to be discharged to:: Private residence Living Arrangements: Spouse/significant other Available Help at Discharge: Family;Available 24 hours/day Type of Home: House Home Access: Level entry     Home Layout: One level Home Equipment: Walker - 2 wheels;Cane - single point;Hospital bed;Hand held shower head;Adaptive equipment;Grab bars - tub/shower;Shower seat - built in Additional Comments: however pt desires to go to rehab    Prior Function Level of Independence: Needs assistance   Gait / Transfers Assistance Needed: requires assist using RW since back surgery, prior to surgery was independnet   ADL's / Homemaking Assistance Needed: requires assist with ADLs since back surgery, prior to surgery independent using  AE   Comments: pt reports he couldn't ambulate at home that he was only standing up about 3x/day     Hand Dominance   Dominant Hand: Right    Extremity/Trunk Assessment   Upper  Extremity Assessment Upper Extremity Assessment: Defer to OT evaluation RUE Deficits / Details: WFL LUE Deficits / Details: longstanding shoulder limitations, WFL elbow and distal     Lower Extremity Assessment Lower Extremity Assessment: Generalized weakness    Cervical / Trunk Assessment Cervical / Trunk Assessment: Other exceptions Cervical / Trunk Exceptions: recent spinal surgery  Communication   Communication: No difficulties  Cognition Arousal/Alertness: Awake/alert Behavior During Therapy: WFL for tasks assessed/performed Overall Cognitive Status: Within Functional Limits for tasks assessed                                        General Comments General comments (skin integrity, edema, etc.): incision healing, no dressing or drainage    Exercises     Assessment/Plan    PT Assessment Patient needs continued PT services  PT Problem List Decreased activity tolerance;Decreased balance;Decreased mobility;Decreased knowledge of use of DME;Decreased knowledge of precautions;Decreased strength       PT Treatment Interventions DME instruction;Gait training;Stair training;Functional mobility training;Therapeutic activities;Therapeutic exercise;Balance training;Patient/family education    PT Goals (Current goals can be found in the Care Plan section)  Acute Rehab PT Goals Patient Stated Goal: to have less pain and get stronger PT Goal Formulation: With patient Time For Goal Achievement: 12/15/18 Potential to Achieve Goals: Good    Frequency Min 5X/week   Barriers to discharge        Co-evaluation               AM-PAC PT "6 Clicks" Mobility  Outcome Measure Help needed turning from your back to your side while in a flat bed without using bedrails?: A Little Help needed moving from lying on your back to sitting on the side of a flat bed without using bedrails?: A Little Help needed moving to and from a bed to a chair (including a wheelchair)?: A  Little Help needed standing up from a chair using your arms (e.g., wheelchair or bedside chair)?: A Little Help needed to walk in hospital room?: A Little Help needed climbing 3-5 steps with a railing? : A Lot 6 Click Score: 17    End of Session Equipment Utilized During Treatment: Gait belt Activity Tolerance: Other (comment)(Self-limiting) Patient left: with call bell/phone within reach;in chair;with chair alarm set Nurse Communication: Mobility status PT Visit Diagnosis: Other abnormalities of gait and mobility (R26.89);Difficulty in walking, not elsewhere classified (R26.2)    Time: 7209-4709 PT Time Calculation (min) (ACUTE ONLY): 32 min   Charges:   PT Evaluation $PT Eval Moderate Complexity: 1 Mod PT Treatments $Gait Training: 8-22 mins        Kittie Plater, PT, DPT Acute Rehabilitation Services Pager #: [phone] Office #: [phone]   Berline Lopes 12/01/2018, 12:47 PM

## 2018-12-01 NOTE — Progress Notes (Signed)
  NEUROSURGERY PROGRESS NOTE   No issues overnight.  Pain significantly improved Worked with therapy today  EXAM:  BP 119/69 (BP Location: Left Arm)   Pulse 81   Temp 98.8 F (37.1 C) (Oral)   Resp 19   Wt 109.3 kg   SpO2 96%   BMI 33.61 kg/m   Awake, alert, oriented  Speech fluent, appropriate  CN grossly intact  MAEW with good strength Incision c/d/i  PLAN Much improved this am pain wise PT/OT rec CIR. Consult placed. Continue supportive care

## 2018-12-01 NOTE — Evaluation (Signed)
Occupational Therapy Evaluation Patient Details Name: Todd Gonzales MRN: [id number] DOB: March 02, 1941 Today's Date: 12/01/2018    History of Present Illness Pt is a 77 y/o male admitted 11/30/18 for treatment of postoperative pain and immobility after B T12-L1 laminotomies 10 days ago with increased difficulty with mobility, self care. PMH: arthritis, hypertension,Spinal stenosis, and UTI.    Clinical Impression   Patient admitted for above and limited by generalized weakness, pain, and decreased activity tolerance.  He recently discharged home but reports inability to complete self care, toilet transfers or mobility.  Patient demonstrates ability to complete basic transfers with min assist using RW, min assist for toileting, mod assist for LB ADLs, and min guard assist for grooming standing at toilet.  Noted bowel incontinence during session, requiring multiple trips to commode.  Patient will benefit from continued OT services while admitted and at this time recommend intensive CIR level rehab in order to optimize independence with ADLs and mobility. Will continue to follow.     Follow Up Recommendations  CIR;Supervision/Assistance - 24 hour    Equipment Recommendations  None recommended by OT    Recommendations for Other Services       Precautions / Restrictions Precautions Precautions: Fall;Back Precaution Comments: patient able to recall 3/3 back precautions with independence  Restrictions Weight Bearing Restrictions: No      Mobility Bed Mobility               General bed mobility comments: OOB in recliner upon entry  Transfers Overall transfer level: Needs assistance Equipment used: Rolling walker (2 wheeled) Transfers: Sit to/from Stand Sit to Stand: Min assist         General transfer comment: cueing for hand placement, min assist for safety and balance     Balance Overall balance assessment: Needs assistance Sitting-balance support: No upper extremity  supported;Feet supported Sitting balance-Leahy Scale: Good     Standing balance support: No upper extremity supported Standing balance-Leahy Scale: Fair Standing balance comment: can stand without UE support at sink but preference to support                           ADL either performed or assessed with clinical judgement   ADL Overall ADL's : Needs assistance/impaired     Grooming: Min guard;Standing Grooming Details (indicate cue type and reason): washing hands standing at sink, min guard for safety and balance  Upper Body Bathing: Supervision/ safety;Sitting   Lower Body Bathing: Moderate assistance;Sit to/from stand   Upper Body Dressing : Set up;Sitting   Lower Body Dressing: Moderate assistance;Sit to/from stand Lower Body Dressing Details (indicate cue type and reason): educated on compensatory techniques, pt uses AE and unable to complete figure 4 technique Toilet Transfer: Minimal assistance;Ambulation;BSC;RW;Regular Glass blower/designer Details (indicate cue type and reason): 3:1 over toilet  Toileting- Clothing Manipulation and Hygiene: Minimal assistance;Sit to/from stand;Cueing for back precautions;Cueing for compensatory techniques       Functional mobility during ADLs: Minimal assistance;Rolling walker General ADL Comments: pt with improved pain control since admission, min assist for mobility with min cueing for back preacutions and compensatory techniques     Vision   Vision Assessment?: No apparent visual deficits     Perception     Praxis      Pertinent Vitals/Pain Pain Assessment: Faces Faces Pain Scale: Hurts a little bit Pain Location: Back Pain Descriptors / Indicators: Discomfort;Sore Pain Intervention(s): Monitored during session;Repositioned     Hand  Dominance Right   Extremity/Trunk Assessment Upper Extremity Assessment Upper Extremity Assessment: RUE deficits/detail RUE Deficits / Details: WFL LUE Deficits / Details:  longstanding shoulder limitations, WFL elbow and distal    Lower Extremity Assessment Lower Extremity Assessment: Defer to PT evaluation   Cervical / Trunk Assessment Cervical / Trunk Assessment: Other exceptions Cervical / Trunk Exceptions: recent spinal surgery   Communication Communication Communication: No difficulties   Cognition Arousal/Alertness: Awake/alert Behavior During Therapy: WFL for tasks assessed/performed Overall Cognitive Status: Within Functional Limits for tasks assessed                                     General Comments       Exercises     Shoulder Instructions      Home Living Family/patient expects to be discharged to:: Private residence Living Arrangements: Spouse/significant other Available Help at Discharge: Family;Available 24 hours/day Type of Home: House Home Access: Level entry     Home Layout: One level     Bathroom Shower/Tub: Occupational psychologist: Handicapped height     Home Equipment: Environmental consultant - 2 wheels;Cane - single point;Hospital bed;Hand held shower head;Adaptive equipment;Grab bars - tub/shower;Shower seat - built in [employer] Equipment: Reacher;Sock aid;Long-handled sponge        Prior Functioning/Environment Level of Independence: Needs assistance  Gait / Transfers Assistance Needed: requires assist using RW since back surgery, prior to surgery was independnet  ADL's / Homemaking Assistance Needed: requires assist with ADLs since back surgery, prior to surgery independent using AE             OT Problem List: Decreased strength;Decreased activity tolerance;Impaired balance (sitting and/or standing);Pain;Decreased safety awareness;Decreased knowledge of use of DME or AE;Decreased knowledge of precautions      OT Treatment/Interventions: Self-care/ADL training;Therapeutic exercise;DME and/or AE instruction;Energy conservation;Therapeutic activities;Patient/family education;Balance training     OT Goals(Current goals can be found in the care plan section) Acute Rehab OT Goals Patient Stated Goal: to have less pain and get stronger OT Goal Formulation: With patient Time For Goal Achievement: 12/15/18 Potential to Achieve Goals: Good  OT Frequency: Min 2X/week   Barriers to D/C:            Co-evaluation              AM-PAC OT "6 Clicks" Daily Activity     Outcome Measure Help from another person eating meals?: None Help from another person taking care of personal grooming?: A Little Help from another person toileting, which includes using toliet, bedpan, or urinal?: A Little Help from another person bathing (including washing, rinsing, drying)?: A Lot Help from another person to put on and taking off regular upper body clothing?: None Help from another person to put on and taking off regular lower body clothing?: A Lot 6 Click Score: 18   End of Session Equipment Utilized During Treatment: Gait belt;Rolling walker Nurse Communication: Mobility status  Activity Tolerance: Patient tolerated treatment well Patient left: in chair;with call bell/phone within reach;with chair alarm set;with family/visitor present  OT Visit Diagnosis: Pain;Muscle weakness (generalized) (M62.81) Pain - part of body: (back)                Time: 1610-9604 OT Time Calculation (min): 38 min Charges:  OT General Charges $OT Visit: 1 Visit OT Evaluation $OT Eval Moderate Complexity: 1 Mod OT Treatments $Self Care/Home Management : 23-37 mins  Adonis Brook  [place], OT Acute Rehabilitation Services Pager [phone] Office [phone]   Delight Stare 12/01/2018, 11:16 AM

## 2018-12-02 LAB — URINE CULTURE: Culture: NO GROWTH

## 2018-12-02 NOTE — Plan of Care (Signed)
  Problem: Clinical Measurements: Goal: Diagnostic test results will improve Outcome: Progressing   

## 2018-12-02 NOTE — Progress Notes (Signed)
NEUROSURGERY PROGRESS NOTE  Doing well. Complains of back soreness. Good strength and sensation Has been working with therapies and doing ok. Currently sitting in chair eating.  Temp:  [97.6 F (36.4 C)-98.8 F (37.1 C)] 98.5 F (36.9 C) (12/29 0400) Pulse Rate:  [78-84] 79 (12/28 1553) Resp:  [19-21] 21 (12/29 0400) BP: (62-119)/(34-69) 115/69 (12/29 0400) SpO2:  [93 %-98 %] 93 % (12/29 0400)  Plan: Continue therapies. Waiting on CIR consult for placement as recommended by therapies.   Eleonore Chiquito, NP 12/02/2018 8:01 AM

## 2018-12-03 MED ORDER — POLYETHYLENE GLYCOL 3350 17 G PO PACK
17.0000 g | PACK | Freq: Every day | ORAL | Status: DC
  Administered 2018-12-07: 17 g via ORAL
  Filled 2018-12-03 (×3): qty 1

## 2018-12-03 NOTE — Clinical Social Work Note (Signed)
Clinical Social Work Assessment  Patient Details  Name: Todd Gonzales MRN: [id number] Date of Birth: [birth year]  Date of referral:  12/03/18               Reason for consult:  Discharge Planning, Facility Placement                Permission sought to share information with:  Facility Sport and exercise psychologist, Family Supports Permission granted to share information::  Yes, Verbal Permission Granted  Name::     [employer]  Agency::  SNFs  Relationship::  wife  Contact Information:     Housing/Transportation Living arrangements for the past 2 months:  Single Family Home Source of Information:  Patient, Spouse Patient Interpreter Needed:  None Criminal Activity/Legal Involvement Pertinent to Current Situation/Hospitalization:  No - Comment as needed Significant Relationships:  Warehouse manager, Friend, Other Family Members, Spouse Lives with:  Spouse Do you feel safe going back to the place where you live?  Yes Need for family participation in patient care:  Yes (Comment)  Care giving concerns:  Pt readmit from recent surgery, felt he could manage and then was progressively weaker and sorer. Returned and now interested in SNF level therapies, as he lives with his wife who is unable to physically assist.    Facilities manager / plan:  CSW met with pt and pt wife in room. Introduced self, role, and reason for visit. Pt lives in [place] with his wife and recently underwent surgery. He returned to hospital post op for complications and now worries about returning home. We discussed knowledge that CIR has declined, and that pt two preferred facilities either do not have beds or are out of network with pt's insurance. 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   Pt wife given CMS offers and ratings. Pt and pt wife disappointed at ratings of facilities. Would like to see if any further offers received in the morning on 12/31. Pt insurance has been started on 12/30 through [facility], await determination.   Employment status:  Retired Nurse, adult PT Recommendations:  [place], [place] / Referral to community resources:  [place]  Patient/Family's Response to care: Pt and pt wife amenable to visit, disappointed pt preferred facilities are not able to offer. Request more offers and then they will make choice, they are aware insurance is pending.  Patient/Family's Understanding of and Emotional Response to Diagnosis, Current Treatment, and Prognosis:  Pt and pt wife state understanding of diagnosis, current treatment and prognosis. Despite readmit pt and pt wife pleasant and upbeat. They are eager to see any other offers.   Emotional Assessment Appearance:  Appears stated age Attitude/Demeanor/Rapport:  Gracious, Engaged, Ambitious Affect (typically observed):  Accepting, Adaptable, Appropriate, Pleasant Orientation:  Oriented to Self, Oriented to Place, Oriented to Situation, Oriented to  Time Alcohol / Substance use:  Not Applicable Psych involvement (Current and /or in the community):  No (Comment)  Discharge Needs  Concerns to be addressed:  Care Coordination Readmission within the last 30 days:  Yes Current discharge risk:  Physical Impairment Barriers to Discharge:  Ship broker, Continued Medical Work up   [employer], [place] 12/03/2018, 4:02 PM

## 2018-12-03 NOTE — NC FL2 (Addendum)
[place] LEVEL OF CARE SCREENING TOOL     IDENTIFICATION  Patient Name: Todd Gonzales Birthdate: 10-16-1941 Sex: male Admission Date (Current Location): 11/30/2018  [facility] and [place] Number:  Herbalist and Address:  [facility], [place] [address] [place],  [address]      Provider Number: [id number]  Attending Physician Name and Address:  Kristeen Miss, MD  Relative Name and Phone Number:       Current Level of Care: Hospital Recommended Level of Care: [place] Prior Approval Number:    Date Approved/Denied:   PASRR Number: [id number]  Discharge Plan: SNF    Current Diagnoses: Patient Active Problem List   Diagnosis Date Noted  . Incontinence of feces   . Post-op pain   . Hyperglycemia   . Status post laminectomy   . Postoperative pain after spinal surgery 11/30/2018  . Myelopathy concurrent with and due to stenosis of lumbar spine ([place]) 11/20/2018  . Microalbuminuria 09/02/2016  . HLD (hyperlipidemia) 09/02/2016  . Spinal stenosis   . Other fatigue 08/24/2016  . HTN (hypertension) 08/24/2016  . BPH (benign prostatic hyperplasia) 08/24/2016  . Nocturia 08/24/2016  . Advance care planning 08/24/2016  . S/P Left THA, AA 03/20/2012    Orientation RESPIRATION BLADDER Height & Weight     Self, Time, Situation, Place  Normal Continent Weight: 240 lb 15.4 oz (109.3 kg) Height:     BEHAVIORAL SYMPTOMS/MOOD NEUROLOGICAL BOWEL NUTRITION STATUS      Continent Diet(see discharge summary)  AMBULATORY STATUS COMMUNICATION OF NEEDS Skin   Limited Assist   Normal                       Personal Care Assistance Level of Assistance  Bathing, Feeding, Dressing Bathing Assistance: Limited assistance Feeding assistance: Independent Dressing Assistance: Limited assistance     Functional Limitations Info  Hearing, Sight, Speech Sight Info: Adequate Hearing Info: Adequate Speech Info: Adequate     SPECIAL CARE FACTORS FREQUENCY  PT (By licensed PT), OT (By licensed OT)     PT Frequency: 5x week OT Frequency: 5x week            Contractures Contractures Info: Not present    Additional Factors Info  Code Status, Allergies Code Status Info: Full Code Allergies Info: Codeine           Current Medications (12/03/2018):  This is the current hospital active medication list Current Facility-Administered Medications  Medication Dose Route Frequency Provider Last Rate Last Dose  . 0.9 %  sodium chloride infusion  250 mL Intravenous Continuous Jovita Gamma, MD      . acetaminophen (TYLENOL) tablet 650 mg  650 mg Oral Q4H PRN Jovita Gamma, MD       Or  . acetaminophen (TYLENOL) suppository 650 mg  650 mg Rectal Q4H PRN Jovita Gamma, MD      . alum & mag hydroxide-simeth (MAALOX/MYLANTA) 200-200-20 MG/5ML suspension 30 mL  30 mL Oral Q6H PRN Jovita Gamma, MD      . benazepril (LOTENSIN) tablet 20 mg  20 mg Oral Daily Jovita Gamma, MD   20 mg at 12/01/18 0831   And  . hydrochlorothiazide (MICROZIDE) capsule 12.5 mg  12.5 mg Oral Daily Jovita Gamma, MD   12.5 mg at 12/02/18 0919  . bisacodyl (DULCOLAX) suppository 10 mg  10 mg Rectal Daily PRN Jovita Gamma, MD      . celecoxib (CELEBREX)  capsule 200 mg  200 mg Oral Daily Jovita Gamma, MD   200 mg at 12/03/18 6122  . cephALEXin (KEFLEX) capsule 500 mg  500 mg Oral Once Dorie Rank, MD      . cholecalciferol (VITAMIN D3) tablet 1,000 Units  1,000 Units Oral Daily Jovita Gamma, MD   1,000 Units at 12/03/18 [phone]  . gabapentin (NEURONTIN) capsule 200 mg  200 mg Oral QHS Jovita Gamma, MD   200 mg at 12/02/18 2137  . heparin injection 5,000 Units  5,000 Units Subcutaneous Q12H Jovita Gamma, MD   5,000 Units at 12/03/18 [phone]  . HYDROcodone-acetaminophen (NORCO/VICODIN) 5-325 MG per tablet 1-2 tablet  1-2 tablet Oral Q4H PRN Jovita Gamma, MD   2 tablet at 12/03/18 [phone]  . hydrOXYzine  (ATARAX/VISTARIL) tablet 50 mg  50 mg Oral Q4H PRN Jovita Gamma, MD   50 mg at 12/01/18 1807  . hydrOXYzine (VISTARIL) injection 50 mg  50 mg Intramuscular Q4H PRN Jovita Gamma, MD      . magnesium hydroxide (MILK OF MAGNESIA) suspension 30 mL  30 mL Oral Daily PRN Jovita Gamma, MD      . menthol-cetylpyridinium (CEPACOL) lozenge 3 mg  1 lozenge Oral PRN Jovita Gamma, MD       Or  . phenol (CHLORASEPTIC) mouth spray 1 spray  1 spray Mouth/Throat PRN Jovita Gamma, MD      . methocarbamol (ROBAXIN) tablet 500 mg  500 mg Oral Q6H PRN Jovita Gamma, MD   500 mg at 12/02/18 1732  . morphine 4 MG/ML injection 4-8 mg  4-8 mg Intramuscular Q3H PRN Jovita Gamma, MD      . sodium chloride flush (NS) 0.9 % injection 3 mL  3 mL Intravenous Q12H Jovita Gamma, MD   3 mL at 12/02/18 2138  . sodium chloride flush (NS) 0.9 % injection 3 mL  3 mL Intravenous PRN Jovita Gamma, MD      . sodium phosphate (FLEET) 7-19 GM/118ML enema 1 enema  1 enema Rectal Once PRN Jovita Gamma, MD      . tamsulosin (FLOMAX) capsule 0.4 mg  0.4 mg Oral Daily Jovita Gamma, MD   0.4 mg at 12/03/18 0953  . vitamin B-12 (CYANOCOBALAMIN) tablet 1,000 mcg  1,000 mcg Oral Daily Jovita Gamma, MD   1,000 mcg at 12/03/18 2111     Discharge Medications: Please see discharge summary for a list of discharge medications.  Relevant Imaging Results:  Relevant Lab Results:   Additional Information [id number] [place], [place]

## 2018-12-03 NOTE — Progress Notes (Signed)
IP rehab admissions - I met with patient and his wife and explained that his recommendations are for SNF placement.  Please see rehab consult done by Dr. Posey Pronto recommending SNF.  Patient is interested in [facility] SNF in [place] and [facility] in [place].  Call me for questions.  [phone]

## 2018-12-03 NOTE — Progress Notes (Signed)
Physical Therapy Treatment Patient Details Name: Todd Gonzales MRN: [id number] DOB: October 09, 1941 Today's Date: 12/03/2018    History of Present Illness Pt is a 77 y/o male admitted 11/30/18 for treatment of postoperative pain and immobility after B T12-L1 laminotomies 10 days ago with increased difficulty with mobility, self care. PMH: arthritis, hypertension,Spinal stenosis, and UTI.     PT Comments    Pt much improved from initial eval. Pt functioning at minA/min guard level. Pt tolerated 120' of amb with RW with min guard. Pt reports "I can't go home because my wife can't help me, she's 57". Pt progressing well and it appears does not qualify for CIR. Pt will need min A at home and if wife can not provide pt will need to go to ST-SNF to achieve safe mod I level of function for safe transition home with wife.    Follow Up Recommendations  SNF     Equipment Recommendations  None recommended by PT    Recommendations for Other Services       Precautions / Restrictions Precautions Precautions: Fall;Back Precaution Booklet Issued: No Precaution Comments: patient able to recall 3/3 back precautions with independence  Restrictions Weight Bearing Restrictions: No    Mobility  Bed Mobility               General bed mobility comments: pt up in chair upon PT arrival  Transfers Overall transfer level: Needs assistance Equipment used: Rolling walker (2 wheeled) Transfers: Sit to/from Stand Sit to Stand: Min assist         General transfer comment: pt used rocking method, verbal cues to push up from arm rests, minA for initial power up  Ambulation/Gait Ambulation/Gait assistance: Min guard Gait Distance (Feet): 120 Feet Assistive device: Rolling walker (2 wheeled) Gait Pattern/deviations: Step-through pattern;Decreased stride length;Trunk flexed Gait velocity: Decreased Gait velocity interpretation: <1.31 ft/sec, indicative of household ambulator General Gait Details:  encouraged more upright position and stepping into walker however pt with report "i'm more unsteay' however also reported at end of walk "it's getting to my low back" pt encouraged to utilize abdominal muscles   Stairs             Wheelchair Mobility    Modified Rankin (Stroke Patients Only)       Balance Overall balance assessment: Needs assistance Sitting-balance support: No upper extremity supported;Feet supported Sitting balance-Leahy Scale: Good     Standing balance support: Bilateral upper extremity supported;During functional activity Standing balance-Leahy Scale: Fair Standing balance comment: dependent on RW for safe amb                            Cognition Arousal/Alertness: Awake/alert Behavior During Therapy: WFL for tasks assessed/performed Overall Cognitive Status: Within Functional Limits for tasks assessed                                        Exercises      General Comments General comments (skin integrity, edema, etc.): incision healing      Pertinent Vitals/Pain Pain Assessment: 0-10 Pain Score: 6  Pain Location: back with movement Pain Descriptors / Indicators: Discomfort;Sore Pain Intervention(s): Monitored during session    Home Living                      Prior Function  PT Goals (current goals can now be found in the care plan section) Progress towards PT goals: Progressing toward goals    Frequency    Min 5X/week      PT Plan Discharge plan needs to be updated    Co-evaluation              AM-PAC PT "6 Clicks" Mobility   Outcome Measure  Help needed turning from your back to your side while in a flat bed without using bedrails?: A Little Help needed moving from lying on your back to sitting on the side of a flat bed without using bedrails?: A Little Help needed moving to and from a bed to a chair (including a wheelchair)?: A Little Help needed standing up from a  chair using your arms (e.g., wheelchair or bedside chair)?: A Little Help needed to walk in hospital room?: A Little Help needed climbing 3-5 steps with a railing? : A Lot 6 Click Score: 17    End of Session Equipment Utilized During Treatment: Gait belt Activity Tolerance: Patient tolerated treatment well Patient left: in chair;with call bell/phone within reach;with chair alarm set Nurse Communication: Mobility status PT Visit Diagnosis: Other abnormalities of gait and mobility (R26.89);Difficulty in walking, not elsewhere classified (R26.2)     Time: 8657-8469 PT Time Calculation (min) (ACUTE ONLY): 18 min  Charges:  $Gait Training: 8-22 mins                     Kittie Plater, PT, DPT Acute Rehabilitation Services Pager #: [phone] Office #: [phone]    Todd Gonzales 12/03/2018, 11:39 AM

## 2018-12-03 NOTE — Social Work (Addendum)
1:03pm- Received return message from [facility], they are out of network with pt BCBS Medicare.  12:49pm- CSW acknowledging consult for SNF placement. Have initiated insurance authorization with [employer]. acknowledging preferred facilities: currently [facility] does not have availability and await return call from [facility].   Westley Hummer, MSW, [place] Work [phone]

## 2018-12-03 NOTE — Progress Notes (Signed)
Vitals:   12/03/18 0200 12/03/18 0300 12/03/18 0400 12/03/18 0440  BP:      Pulse: (!) 47 94 66   Resp: (!) 23 20 (!) 27   Temp:    98.8 F (37.1 C)  TempSrc:    Axillary  SpO2: 100% (!) 89% 97%   Weight:        CBC Recent Labs    11/30/18 1127  WBC 7.3  HGB 15.9  HCT 48.6  PLT 229   BMET Recent Labs    11/30/18 1127  NA 139  K 4.2  CL 104  CO2 27  GLUCOSE 122*  BUN 14  CREATININE 0.75  CALCIUM 8.9    Patient sitting up in chair, comfortable.  Has ambulated today in the hall with PT.  Seen by Dr. Posey Pronto, from physical medicine and rehabilitation, who has recommended SNF for rehabilitation.  Have placed social work consultation for SNF placement (patient and his wife have requested [place] or [place], in that order).  Plan: Continuing PT and OT.  Initiating referral to SNF for rehabilitation.  Hosie Spangle, MD 12/03/2018, 9:51 AM

## 2018-12-03 NOTE — Care Management Important Message (Signed)
Important Message  Patient Details  Name: Todd Gonzales MRN: [id number] Date of Birth: February 14, 1941   Medicare Important Message Given:  Yes    Todd Gonzales 12/03/2018, 4:15 PM

## 2018-12-04 MED ORDER — CALCIUM CARBONATE ANTACID 500 MG PO CHEW
1.0000 | CHEWABLE_TABLET | ORAL | Status: DC | PRN
  Administered 2018-12-04: 200 mg via ORAL
  Filled 2018-12-04: qty 1

## 2018-12-04 NOTE — Social Work (Addendum)
4:54pm- collected list that pt wife left at bedside. Facilities listed are [facility], [facility], [facility] and [facility]. As mentioned to wife [facility] and [facility] do not have beds available. Will f/u with [facility] and [facility]. Pt also encouraged to select back up facility.   3:05pm- Spoke with pt wife [place] via telephone, provided update. Pt wife states that she has left additional facilities to inquire about at pt bedside. Will follow up for list. Again, encouraged pt wife to look through provided facilities and select an option as back up in case additional facilities also remain unavailable.  1:30pm- [facility] does not have any beds available today or tomorrow, neither does [facility]. [facility] has also declined as they do not offer to pts who insurance company is out of network if they do not live in the [place].  11:51am- Provided pt and pt wife additional CMS offers, they have requested f/u with [facility], [facility], [facility] (out of network cost). They understand if we do not have any additional offers that they will have to select a facility that has offered.  Westley Hummer, MSW, [place] Work [phone]

## 2018-12-04 NOTE — Progress Notes (Signed)
Physical Therapy Treatment Patient Details Name: Todd Gonzales MRN: [id number] DOB: [birth year] Today's Date: 12/04/2018    History of Present Illness Pt is a 77 y/o male admitted 11/30/18 for treatment of postoperative pain and immobility after B T12-L1 laminotomies 10 days ago with increased difficulty with mobility, self care. PMH: arthritis, hypertension,Spinal stenosis, and UTI.     PT Comments    Pt progressing well towards all mobility. Pt remains unable to take self to the bathroom and toliet which is his wifes primary concern in addition to falling in taking him home. Pt functioning at min guard for mobility but does require assist for ADLs. Pt to benefit from ST-SNF to achieve safe mod I mobility.   Follow Up Recommendations  SNF     Equipment Recommendations  None recommended by PT    Recommendations for Other Services       Precautions / Restrictions Precautions Precautions: Fall;Back Precaution Booklet Issued: No Precaution Comments: patient able to recall 3/3 back precautions with independence  Restrictions Weight Bearing Restrictions: No    Mobility  Bed Mobility Overal bed mobility: Needs Assistance Bed Mobility: Rolling;Sidelying to Sit;Sit to Sidelying Rolling: Supervision Sidelying to sit: Min guard     Sit to sidelying: Min guard General bed mobility comments: pt used bed rail to push self up, no physical assist needed  Transfers Overall transfer level: Needs assistance Equipment used: Rolling walker (2 wheeled) Transfers: Sit to/from Stand Sit to Stand: Min guard         General transfer comment: from chair a lower surface pt able to use rocking method and push up from arm rests, from bed, pt raised bed height up significantly  Ambulation/Gait Ambulation/Gait assistance: Min guard Gait Distance (Feet): 200 Feet Assistive device: Rolling walker (2 wheeled) Gait Pattern/deviations: Step-through pattern;Decreased stride length;Trunk  flexed Gait velocity: Decreased Gait velocity interpretation: 1.31 - 2.62 ft/sec, indicative of limited community ambulator General Gait Details: encourage pt to achieve full trunk extension, pt states "thats what hurts the back" pt with improved walker management and endurance however cont to have significant trunk flexion and is unable to sustain trunk extension   Stairs             Wheelchair Mobility    Modified Rankin (Stroke Patients Only)       Balance           Standing balance support: Bilateral upper extremity supported;During functional activity Standing balance-Leahy Scale: Fair Standing balance comment: dependent on RW for safe amb                            Cognition Arousal/Alertness: Awake/alert Behavior During Therapy: WFL for tasks assessed/performed Overall Cognitive Status: Within Functional Limits for tasks assessed                                 General Comments: wife very anxious regarding d/c plans, unable to care for him at home and doesn't like the options of bed availability from SNF offers      Exercises      General Comments General comments (skin integrity, edema, etc.): incision is healing      Pertinent Vitals/Pain Pain Assessment: 0-10 Pain Score: 2  Pain Location: back Pain Descriptors / Indicators: Discomfort Pain Intervention(s): Monitored during session    Home Living  Prior Function            PT Goals (current goals can now be found in the care plan section) Acute Rehab PT Goals Patient Stated Goal: find a rehab facility Progress towards PT goals: Progressing toward goals    Frequency    Min 3X/week      PT Plan Frequency needs to be updated    Co-evaluation              AM-PAC PT "6 Clicks" Mobility   Outcome Measure  Help needed turning from your back to your side while in a flat bed without using bedrails?: A Little Help needed moving  from lying on your back to sitting on the side of a flat bed without using bedrails?: A Little Help needed moving to and from a bed to a chair (including a wheelchair)?: A Little Help needed standing up from a chair using your arms (e.g., wheelchair or bedside chair)?: A Little Help needed to walk in hospital room?: A Little Help needed climbing 3-5 steps with a railing? : A Little 6 Click Score: 18    End of Session Equipment Utilized During Treatment: Gait belt Activity Tolerance: Patient tolerated treatment well Patient left: in chair;with call bell/phone within reach;with chair alarm set Nurse Communication: Mobility status PT Visit Diagnosis: Other abnormalities of gait and mobility (R26.89);Difficulty in walking, not elsewhere classified (R26.2)     Time:  -     Charges:  $Gait Training: 8-22 mins                     Kittie Plater, PT, DPT Acute Rehabilitation Services Pager #: [phone] Office #: [phone]    Berline Lopes 12/04/2018, 3:00 PM

## 2018-12-04 NOTE — Progress Notes (Signed)
Vitals:   12/03/18 1800 12/03/18 2000 12/03/18 2300 12/04/18 0400  BP:  124/71 (!) 144/85 119/82  Pulse: 78 84 75   Resp: (!) 30  18   Temp:  98.5 F (36.9 C) 97.7 F (36.5 C) 97.9 F (36.6 C)  TempSrc:  Oral Oral Oral  SpO2: 97% 96% 96% 95%  Weight:        Patient mobilizing several times each day, but only ambulated in the halls yesterday with PT, but also ambulated to the bathroom in his room.  Continuing PT and OT.  Social work Biochemist, clinical continue to work on options for placement.  FL 2 signed.  Plan: Continuing PT and OT.  Working on placement to SNF for rehabilitation.  Hosie Spangle, MD 12/04/2018, 6:25 AM

## 2018-12-04 NOTE — Plan of Care (Signed)

## 2018-12-05 NOTE — Progress Notes (Signed)
Subjective: The patient is alert and pleasant.  He is in no apparent distress.  He has no complaints.  Objective: Vital signs in last 24 hours: Temp:  [97.6 F (36.4 C)-98.4 F (36.9 C)] 97.6 F (36.4 C) (01/01 0802) Pulse Rate:  [66-80] 73 (01/01 0802) BP: (101-126)/(55-78) 118/62 (01/01 0802) SpO2:  [94 %-96 %] 96 % (01/01 0802) Estimated body mass index is 33.61 kg/m as calculated from the following:   Height as of 11/07/18: 5\' 11"  (1.803 m).   Weight as of this encounter: 109.3 kg.   Intake/Output from previous day: 12/31 0701 - 01/01 0700 In: -  Out: 100 [Urine:100] Intake/Output this shift: Total I/O In: -  Out: 150 [Urine:150]  Physical exam patient is alert and oriented.  He is moving his lower extremities well.  Lab Results: No results for input(s): WBC, HGB, HCT, PLT in the last 72 hours. BMET No results for input(s): NA, K, CL, CO2, GLUCOSE, BUN, CREATININE, CALCIUM in the last 72 hours.  Studies/Results: No results found.  Assessment/Plan: We are awaiting skilled nursing facility placement.  LOS: 5 days     Ophelia Charter 12/05/2018, 10:17 AM

## 2018-12-05 NOTE — Clinical Social Work Note (Signed)
[facility] has no bed availability. [facility] is reviewing pt's referral.  Loletha Grayer, [place]

## 2018-12-06 NOTE — Discharge Summary (Signed)
Physician Discharge Summary  Patient ID: Todd Gonzales MRN: [id number] DOB/AGE: 02-23-1941 78 y.o.  Admit date: 11/30/2018 Discharge date: 12/07/2018  Admission Diagnoses:  Back pain, status post bilateral T12-L1 laminotomies, immobility, deconditioning  Discharge Diagnoses: Back pain, status post bilateral T12-L1 laminotomies, immobility, deconditioning Active Problems:   Postoperative pain after spinal surgery   Incontinence of feces   Post-op pain   Hyperglycemia   Status post laminectomy   Discharged Condition: good  Hospital Course: Patient was admitted, he had undergone surgery and days prior to admission with Dr. Kristeen Miss.  He was having increasing difficulties with mobility and ADLs, and his family was no longer able to care for him.  PT and OT were consulted, and have continue to work with him throughout the hospitalization.  PM&R was consulted, and recommended SNF placement for rehabilitation rather than CIR.  Social work was consulted regarding SNF referral and have made arrangements for transfer to a SNF for rehabilitation.  Patient's pain has steadily lessened, and has required gradually less pain medication.  Incision has been healing well.  Patient is to follow-up with Dr. Ellene Route in the next couple of weeks in the office.  He is to continue on his usual home medications, including Celebrex.  The prescription was given for the SNF for hydrocodone, to be used on a limited basis (1 to 2 tablets p.o. 4 times daily as needed pain).  I did encourage the patient to minimize his use of narcotic medications going forward.  Consults: PT, OT, PM&R, social work  Discharge Exam: Blood pressure 123/87, pulse 68, temperature 97.8 F (36.6 C), temperature source Oral, resp. rate 19, weight 109.3 kg, SpO2 96 %.  Disposition: Discharge disposition: 03-Skilled Nursing Facility        Allergies as of 12/07/2018      Reactions   Codeine Other (See Comments)   Unknown reaction       Medication List    STOP taking these medications   amoxicillin 500 MG tablet Commonly known as:  AMOXIL   dexamethasone 1 MG tablet Commonly known as:  DECADRON   oxyCODONE-acetaminophen 5-325 MG tablet Commonly known as:  PERCOCET/ROXICET   traMADol 50 MG tablet Commonly known as:  ULTRAM     TAKE these medications   benazepril-hydrochlorthiazide 20-12.5 MG tablet Commonly known as:  LOTENSIN HCT Take 0.5 tablets by mouth daily after breakfast. What changed:  when to take this   celecoxib 200 MG capsule Commonly known as:  CELEBREX Take 200 mg by mouth 2 (two) times daily.   diclofenac sodium 1 % Gel Commonly known as:  VOLTAREN Apply 1 application topically 4 (four) times daily as needed (pain/cramps).   FLOMAX 0.4 MG Caps capsule Generic drug:  tamsulosin Take 0.4 mg by mouth at bedtime.   gabapentin 100 MG capsule Commonly known as:  NEURONTIN Take 200 mg by mouth at bedtime.   ibuprofen 200 MG tablet Commonly known as:  ADVIL,MOTRIN Take 200 mg by mouth every 6 (six) hours as needed (pain).   methocarbamol 500 MG tablet Commonly known as:  ROBAXIN Take 1 tablet (500 mg total) by mouth every 6 (six) hours as needed for muscle spasms.   oxymetazoline 0.05 % nasal spray Commonly known as:  AFRIN Place 2 sprays into the nose at bedtime as needed for congestion (allergies).   sodium chloride 0.65 % nasal spray Commonly known as:  OCEAN Place 1 spray into the nose daily as needed for congestion.   VITAMIN D-1000 MAX  ST 25 MCG (1000 UT) tablet Generic drug:  Cholecalciferol Take 1,000 Units by mouth.   vitamin E 400 UNIT capsule Take 400 Units by mouth.      Contact information for after-discharge care    [facility] .   Service:  Skilled Nursing Contact information: [address] [place] [phone]              Signed: Hosie Spangle 12/07/2018, 8:22 AM

## 2018-12-06 NOTE — Progress Notes (Signed)
Physical Therapy Treatment Patient Details Name: Todd Gonzales MRN: [id number] DOB: 03/14/1941 Today's Date: 12/06/2018    History of Present Illness Pt is a 78 y.o. admitted 11/30/18 for treatment of postoperative pain and immobility since recent back surgery (s/p bilateral laminotomies T12-L1 decompression on 11/20/18). PMH includes HTN, arthritis, spinal stenosis, L4-5 laminectomy (2008), THA (2013), L frozen shoulder (per pt report).   PT Comments    Pt progressing with mobility. Functioning at min-guard level for mobility; requires assist for ADLs. Increased risk for falls. Continues to be motivated to return to PLOF. Would benefit from short-term SNF-level therapies to maximize functional mobility and independence prior to return home.   Follow Up Recommendations  SNF;Supervision for mobility/OOB     Equipment Recommendations  None recommended by PT    Recommendations for Other Services       Precautions / Restrictions Precautions Precautions: Fall;Back Precaution Comments: Pt able to independently recall 3/3 back precautions Restrictions Weight Bearing Restrictions: No    Mobility  Bed Mobility Overal bed mobility: Needs Assistance Bed Mobility: Rolling;Sidelying to Sit Rolling: Supervision Sidelying to sit: Supervision;HOB elevated       General bed mobility comments: Reliant on use of bed rail  Transfers Overall transfer level: Needs assistance Equipment used: Rolling walker (2 wheeled) Transfers: Sit to/from Stand Sit to Stand: Min guard         General transfer comment: Stood from elevated bed height and low toilet height with RW and min guard; heavy reliance on BUE support to push into standing  Ambulation/Gait Ambulation/Gait assistance: Counsellor (Feet): 200 Feet Assistive device: Straight cane Gait Pattern/deviations: Step-through pattern;Decreased stride length;Trunk flexed Gait velocity: Decreased Gait velocity interpretation:  1.31 - 2.62 ft/sec, indicative of limited community ambulator General Gait Details: Slow ambulation with RW and min guard; encouraged pt to maintain closer proximity to RW and increase back extension, but pt reports this makes pain worse. C/o bilateral knee fatigue and pain   Stairs             Wheelchair Mobility    Modified Rankin (Stroke Patients Only)       Balance Overall balance assessment: Needs assistance Sitting-balance support: No upper extremity supported;Feet supported Sitting balance-Leahy Scale: Fair     Standing balance support: Bilateral upper extremity supported;During functional activity Standing balance-Leahy Scale: Fair Standing balance comment: Can static stand without UE support; reliant on UE support for dynamic stability                            Cognition Arousal/Alertness: Awake/alert Behavior During Therapy: WFL for tasks assessed/performed Overall Cognitive Status: Within Functional Limits for tasks assessed                                 General Comments: WFL for simple tasks      Exercises      General Comments General comments (skin integrity, edema, etc.): Wife present      Pertinent Vitals/Pain Pain Assessment: Faces Faces Pain Scale: Hurts a little bit Pain Location: back Pain Descriptors / Indicators: Discomfort;Constant Pain Intervention(s): Monitored during session    Home Living                      Prior Function            PT Goals (current goals can now be found  in the care plan section) Acute Rehab PT Goals Patient Stated Goal: Discharge to SNF tomorrow PT Goal Formulation: With patient/family Time For Goal Achievement: 12/15/18 Potential to Achieve Goals: Good Progress towards PT goals: Progressing toward goals    Frequency    Min 3X/week      PT Plan Current plan remains appropriate    Co-evaluation              AM-PAC PT "6 Clicks" Mobility   Outcome  Measure  Help needed turning from your back to your side while in a flat bed without using bedrails?: A Little Help needed moving from lying on your back to sitting on the side of a flat bed without using bedrails?: A Little Help needed moving to and from a bed to a chair (including a wheelchair)?: A Little Help needed standing up from a chair using your arms (e.g., wheelchair or bedside chair)?: A Little Help needed to walk in hospital room?: A Little Help needed climbing 3-5 steps with a railing? : A Little 6 Click Score: 18    End of Session Equipment Utilized During Treatment: Gait belt Activity Tolerance: Patient tolerated treatment well Patient left: in chair;with call bell/phone within reach;with family/visitor present Nurse Communication: Mobility status PT Visit Diagnosis: Other abnormalities of gait and mobility (R26.89);Difficulty in walking, not elsewhere classified (R26.2)     Time: 2703-5009 PT Time Calculation (min) (ACUTE ONLY): 20 min  Charges:  $Gait Training: 8-22 mins                    Mabeline Caras, PT, DPT Acute Rehabilitation Services  Pager [phone] Office [place] 12/06/2018, 4:38 PM

## 2018-12-06 NOTE — Progress Notes (Signed)
Vitals:   12/05/18 2000 12/05/18 2300 12/06/18 0300 12/06/18 0840  BP: 122/66 140/83 (!) 147/86 130/71  Pulse:    86  Resp:    19  Temp: 98.6 F (37 C) 97.8 F (36.6 C) 99.1 F (37.3 C) 98.8 F (37.1 C)  TempSrc: Oral Oral Oral   SpO2: 100% 100% 100% 98%  Weight:        Patient sitting up in the chair.  Has been ambulating with the staff, using a rolling walker.  Continuing PT and OT.  Social work continue to work on arrangements for placement in a SNF for rehabilitation.  Plan: Continue PT and OT.  Awaiting SNF placement for rehabilitation.  Hosie Spangle, MD 12/06/2018, 10:52 AM

## 2018-12-06 NOTE — Plan of Care (Signed)

## 2018-12-06 NOTE — Social Work (Addendum)
4:13- Auth # received from [facility]; [phone], pt will be set for discharge on 1/3 to [facility]  11:10am- Spoke with pt and pt wife this morning. Explained that we have no further available beds and they must choose from SNFs offered. Pt wife has selected [facility]. CSW has reached out to [facility].  Westley Hummer, MSW, [place] Work [phone]

## 2018-12-07 DIAGNOSIS — Z4789 Encounter for other orthopedic aftercare: Secondary | ICD-10-CM | POA: Diagnosis not present

## 2018-12-07 DIAGNOSIS — N400 Enlarged prostate without lower urinary tract symptoms: Secondary | ICD-10-CM | POA: Diagnosis not present

## 2018-12-07 DIAGNOSIS — M1990 Unspecified osteoarthritis, unspecified site: Secondary | ICD-10-CM | POA: Diagnosis not present

## 2018-12-07 DIAGNOSIS — M5106 Intervertebral disc disorders with myelopathy, lumbar region: Secondary | ICD-10-CM | POA: Diagnosis not present

## 2018-12-07 DIAGNOSIS — G629 Polyneuropathy, unspecified: Secondary | ICD-10-CM | POA: Diagnosis not present

## 2018-12-07 DIAGNOSIS — I10 Essential (primary) hypertension: Secondary | ICD-10-CM | POA: Diagnosis not present

## 2018-12-07 DIAGNOSIS — M48061 Spinal stenosis, lumbar region without neurogenic claudication: Secondary | ICD-10-CM | POA: Diagnosis not present

## 2018-12-07 DIAGNOSIS — N401 Enlarged prostate with lower urinary tract symptoms: Secondary | ICD-10-CM | POA: Diagnosis not present

## 2018-12-07 DIAGNOSIS — E785 Hyperlipidemia, unspecified: Secondary | ICD-10-CM | POA: Diagnosis not present

## 2018-12-07 DIAGNOSIS — R338 Other retention of urine: Secondary | ICD-10-CM | POA: Diagnosis not present

## 2018-12-07 DIAGNOSIS — Z7401 Bed confinement status: Secondary | ICD-10-CM | POA: Diagnosis not present

## 2018-12-07 DIAGNOSIS — M2550 Pain in unspecified joint: Secondary | ICD-10-CM | POA: Diagnosis not present

## 2018-12-07 NOTE — Clinical Social Work Placement (Signed)
   CLINICAL SOCIAL WORK PLACEMENT  NOTE [facility]   Date:  12/07/2018  Patient Details  Name: Todd Gonzales MRN: [id number] Date of Birth: 09-Feb-1941  Clinical Social Work is seeking post-discharge placement for this patient at the [place] level of care (*CSW will initial, date and re-position this form in  chart as items are completed):  Yes   Patient/family provided with [place] Work Department's list of facilities offering this level of care within the geographic area requested by the patient (or if unable, by the patient's family).  Yes   Patient/family informed of their freedom to choose among providers that offer the needed level of care, that participate in Medicare, Medicaid or managed care program needed by the patient, have an available bed and are willing to accept the patient.  Yes   Patient/family informed of [facility]'s ownership interest in [facility] and [facility], as well as of the fact that they are under no obligation to receive care at these facilities.  PASRR submitted to EDS on       PASRR number received on       Existing PASRR number confirmed on 12/03/18     FL2 transmitted to all facilities in geographic area requested by pt/family on 12/03/18     FL2 transmitted to all facilities within larger geographic area on       Patient informed that his/her managed care company has contracts with or will negotiate with certain facilities, including the following:        Yes   Patient/family informed of bed offers received.  Patient chooses bed at [facility]     Physician recommends and patient chooses bed at      Patient to be transferred to [facility] on 12/07/18.  Patient to be transferred to facility by [facility]     Patient family notified on 12/07/18 of transfer.  Name of family member notified:  pt wife Todd Gonzales     PHYSICIAN       Additional Comment:     _______________________________________________ Alexander Mt, [place] 12/07/2018, 11:10 AM

## 2018-12-07 NOTE — Social Work (Signed)
Clinical Social Worker facilitated patient discharge including contacting patient family and facility to confirm patient discharge plans.  Clinical information faxed to facility and family agreeable with plan.  CSW arranged ambulance transport via [facility] to [facility]. RN to call [phone] with report prior to discharge.  Clinical Social Worker will sign off for now as social work intervention is no longer needed. Please consult [place] again if new need arises.  Westley Hummer, MSW, [place] Social Worker [phone]

## 2018-12-07 NOTE — Care Management Important Message (Signed)
Important Message  Patient Details  Name: Todd Gonzales MRN: [id number] Date of Birth: 1941/03/18   Medicare Important Message Given:  Yes    Jemila Camille Montine Circle 12/07/2018, 3:05 PM

## 2018-12-07 NOTE — Progress Notes (Signed)
Report called to [facility] at [facility], [phone].  Written prescription for Norco will be sent with patient to facility.  [facility] to transport.  IV removed, patient tolerated well.

## 2018-12-10 DIAGNOSIS — N400 Enlarged prostate without lower urinary tract symptoms: Secondary | ICD-10-CM | POA: Diagnosis not present

## 2018-12-10 DIAGNOSIS — M48061 Spinal stenosis, lumbar region without neurogenic claudication: Secondary | ICD-10-CM | POA: Diagnosis not present

## 2018-12-10 DIAGNOSIS — I10 Essential (primary) hypertension: Secondary | ICD-10-CM | POA: Diagnosis not present

## 2018-12-21 DIAGNOSIS — E785 Hyperlipidemia, unspecified: Secondary | ICD-10-CM | POA: Diagnosis not present

## 2018-12-21 DIAGNOSIS — M1990 Unspecified osteoarthritis, unspecified site: Secondary | ICD-10-CM | POA: Diagnosis not present

## 2018-12-21 DIAGNOSIS — R338 Other retention of urine: Secondary | ICD-10-CM | POA: Diagnosis not present

## 2018-12-21 DIAGNOSIS — N401 Enlarged prostate with lower urinary tract symptoms: Secondary | ICD-10-CM | POA: Diagnosis not present

## 2018-12-21 DIAGNOSIS — M48061 Spinal stenosis, lumbar region without neurogenic claudication: Secondary | ICD-10-CM | POA: Diagnosis not present

## 2018-12-21 DIAGNOSIS — M5106 Intervertebral disc disorders with myelopathy, lumbar region: Secondary | ICD-10-CM | POA: Diagnosis not present

## 2018-12-21 DIAGNOSIS — Z9181 History of falling: Secondary | ICD-10-CM | POA: Diagnosis not present

## 2018-12-21 DIAGNOSIS — I10 Essential (primary) hypertension: Secondary | ICD-10-CM | POA: Diagnosis not present

## 2018-12-21 DIAGNOSIS — G629 Polyneuropathy, unspecified: Secondary | ICD-10-CM | POA: Diagnosis not present

## 2018-12-21 DIAGNOSIS — Z4789 Encounter for other orthopedic aftercare: Secondary | ICD-10-CM | POA: Diagnosis not present

## 2018-12-22 DIAGNOSIS — Z96649 Presence of unspecified artificial hip joint: Secondary | ICD-10-CM | POA: Diagnosis not present

## 2018-12-22 DIAGNOSIS — M169 Osteoarthritis of hip, unspecified: Secondary | ICD-10-CM | POA: Diagnosis not present

## 2018-12-22 DIAGNOSIS — M48061 Spinal stenosis, lumbar region without neurogenic claudication: Secondary | ICD-10-CM | POA: Diagnosis not present

## 2018-12-23 DIAGNOSIS — M169 Osteoarthritis of hip, unspecified: Secondary | ICD-10-CM | POA: Diagnosis not present

## 2018-12-23 DIAGNOSIS — M48061 Spinal stenosis, lumbar region without neurogenic claudication: Secondary | ICD-10-CM | POA: Diagnosis not present

## 2018-12-23 DIAGNOSIS — Z96649 Presence of unspecified artificial hip joint: Secondary | ICD-10-CM | POA: Diagnosis not present

## 2018-12-25 DIAGNOSIS — R05 Cough: Secondary | ICD-10-CM | POA: Diagnosis not present

## 2018-12-25 DIAGNOSIS — J069 Acute upper respiratory infection, unspecified: Secondary | ICD-10-CM | POA: Diagnosis not present

## 2018-12-25 DIAGNOSIS — I10 Essential (primary) hypertension: Secondary | ICD-10-CM | POA: Diagnosis not present

## 2018-12-25 DIAGNOSIS — Z6833 Body mass index (BMI) 33.0-33.9, adult: Secondary | ICD-10-CM | POA: Diagnosis not present

## 2018-12-26 DIAGNOSIS — Z4789 Encounter for other orthopedic aftercare: Secondary | ICD-10-CM | POA: Diagnosis not present

## 2018-12-26 DIAGNOSIS — E785 Hyperlipidemia, unspecified: Secondary | ICD-10-CM | POA: Diagnosis not present

## 2018-12-26 DIAGNOSIS — R338 Other retention of urine: Secondary | ICD-10-CM | POA: Diagnosis not present

## 2018-12-26 DIAGNOSIS — M48061 Spinal stenosis, lumbar region without neurogenic claudication: Secondary | ICD-10-CM | POA: Diagnosis not present

## 2018-12-26 DIAGNOSIS — I10 Essential (primary) hypertension: Secondary | ICD-10-CM | POA: Diagnosis not present

## 2018-12-26 DIAGNOSIS — G629 Polyneuropathy, unspecified: Secondary | ICD-10-CM | POA: Diagnosis not present

## 2018-12-26 DIAGNOSIS — Z9181 History of falling: Secondary | ICD-10-CM | POA: Diagnosis not present

## 2018-12-26 DIAGNOSIS — M1990 Unspecified osteoarthritis, unspecified site: Secondary | ICD-10-CM | POA: Diagnosis not present

## 2018-12-26 DIAGNOSIS — N401 Enlarged prostate with lower urinary tract symptoms: Secondary | ICD-10-CM | POA: Diagnosis not present

## 2018-12-26 DIAGNOSIS — M5106 Intervertebral disc disorders with myelopathy, lumbar region: Secondary | ICD-10-CM | POA: Diagnosis not present

## 2018-12-28 DIAGNOSIS — G629 Polyneuropathy, unspecified: Secondary | ICD-10-CM | POA: Diagnosis not present

## 2018-12-28 DIAGNOSIS — I10 Essential (primary) hypertension: Secondary | ICD-10-CM | POA: Diagnosis not present

## 2018-12-28 DIAGNOSIS — Z9181 History of falling: Secondary | ICD-10-CM | POA: Diagnosis not present

## 2018-12-28 DIAGNOSIS — E785 Hyperlipidemia, unspecified: Secondary | ICD-10-CM | POA: Diagnosis not present

## 2018-12-28 DIAGNOSIS — M48061 Spinal stenosis, lumbar region without neurogenic claudication: Secondary | ICD-10-CM | POA: Diagnosis not present

## 2018-12-28 DIAGNOSIS — N401 Enlarged prostate with lower urinary tract symptoms: Secondary | ICD-10-CM | POA: Diagnosis not present

## 2018-12-28 DIAGNOSIS — M1990 Unspecified osteoarthritis, unspecified site: Secondary | ICD-10-CM | POA: Diagnosis not present

## 2018-12-28 DIAGNOSIS — R338 Other retention of urine: Secondary | ICD-10-CM | POA: Diagnosis not present

## 2018-12-28 DIAGNOSIS — M5106 Intervertebral disc disorders with myelopathy, lumbar region: Secondary | ICD-10-CM | POA: Diagnosis not present

## 2018-12-28 DIAGNOSIS — Z4789 Encounter for other orthopedic aftercare: Secondary | ICD-10-CM | POA: Diagnosis not present

## 2019-01-01 DIAGNOSIS — Z4789 Encounter for other orthopedic aftercare: Secondary | ICD-10-CM | POA: Diagnosis not present

## 2019-01-01 DIAGNOSIS — M1990 Unspecified osteoarthritis, unspecified site: Secondary | ICD-10-CM | POA: Diagnosis not present

## 2019-01-01 DIAGNOSIS — Z9181 History of falling: Secondary | ICD-10-CM | POA: Diagnosis not present

## 2019-01-01 DIAGNOSIS — N401 Enlarged prostate with lower urinary tract symptoms: Secondary | ICD-10-CM | POA: Diagnosis not present

## 2019-01-01 DIAGNOSIS — M5106 Intervertebral disc disorders with myelopathy, lumbar region: Secondary | ICD-10-CM | POA: Diagnosis not present

## 2019-01-01 DIAGNOSIS — R338 Other retention of urine: Secondary | ICD-10-CM | POA: Diagnosis not present

## 2019-01-01 DIAGNOSIS — M48061 Spinal stenosis, lumbar region without neurogenic claudication: Secondary | ICD-10-CM | POA: Diagnosis not present

## 2019-01-01 DIAGNOSIS — G629 Polyneuropathy, unspecified: Secondary | ICD-10-CM | POA: Diagnosis not present

## 2019-01-01 DIAGNOSIS — E785 Hyperlipidemia, unspecified: Secondary | ICD-10-CM | POA: Diagnosis not present

## 2019-01-01 DIAGNOSIS — I10 Essential (primary) hypertension: Secondary | ICD-10-CM | POA: Diagnosis not present

## 2019-01-03 DIAGNOSIS — M5106 Intervertebral disc disorders with myelopathy, lumbar region: Secondary | ICD-10-CM | POA: Diagnosis not present

## 2019-01-03 DIAGNOSIS — M48061 Spinal stenosis, lumbar region without neurogenic claudication: Secondary | ICD-10-CM | POA: Diagnosis not present

## 2019-01-03 DIAGNOSIS — I10 Essential (primary) hypertension: Secondary | ICD-10-CM | POA: Diagnosis not present

## 2019-01-03 DIAGNOSIS — M1990 Unspecified osteoarthritis, unspecified site: Secondary | ICD-10-CM | POA: Diagnosis not present

## 2019-01-03 DIAGNOSIS — R338 Other retention of urine: Secondary | ICD-10-CM | POA: Diagnosis not present

## 2019-01-03 DIAGNOSIS — G629 Polyneuropathy, unspecified: Secondary | ICD-10-CM | POA: Diagnosis not present

## 2019-01-03 DIAGNOSIS — E785 Hyperlipidemia, unspecified: Secondary | ICD-10-CM | POA: Diagnosis not present

## 2019-01-03 DIAGNOSIS — Z9181 History of falling: Secondary | ICD-10-CM | POA: Diagnosis not present

## 2019-01-03 DIAGNOSIS — N401 Enlarged prostate with lower urinary tract symptoms: Secondary | ICD-10-CM | POA: Diagnosis not present

## 2019-01-03 DIAGNOSIS — Z4789 Encounter for other orthopedic aftercare: Secondary | ICD-10-CM | POA: Diagnosis not present

## 2019-01-09 DIAGNOSIS — D0439 Carcinoma in situ of skin of other parts of face: Secondary | ICD-10-CM | POA: Diagnosis not present

## 2019-01-09 DIAGNOSIS — L89309 Pressure ulcer of unspecified buttock, unspecified stage: Secondary | ICD-10-CM | POA: Diagnosis not present

## 2019-01-09 DIAGNOSIS — L089 Local infection of the skin and subcutaneous tissue, unspecified: Secondary | ICD-10-CM | POA: Diagnosis not present

## 2019-01-14 DIAGNOSIS — M170 Bilateral primary osteoarthritis of knee: Secondary | ICD-10-CM | POA: Diagnosis not present

## 2019-01-15 ENCOUNTER — Telehealth: Payer: Self-pay | Admitting: Neurology

## 2019-01-15 NOTE — Telephone Encounter (Signed)
We have attempted to call the patient 2 times to schedule sleep study. Patient has been unavailable at the phone numbers we have on file and has not returned our calls. At this point we will send a letter asking pt to please contact the sleep lab to schedule their sleep study. If patient calls back we will schedule them for their sleep study. ° °

## 2019-01-19 IMAGING — CR DG LUMBAR SPINE 2-3V
2 series · 2 of 2 positions shown · non-contrast
Comparison: Lumbar MRI 06/28/2018

CLINICAL DATA: Lumbar spine surgery.

EXAM:
LUMBAR SPINE - 2-3 VIEW

[lateral (1 of 2)]
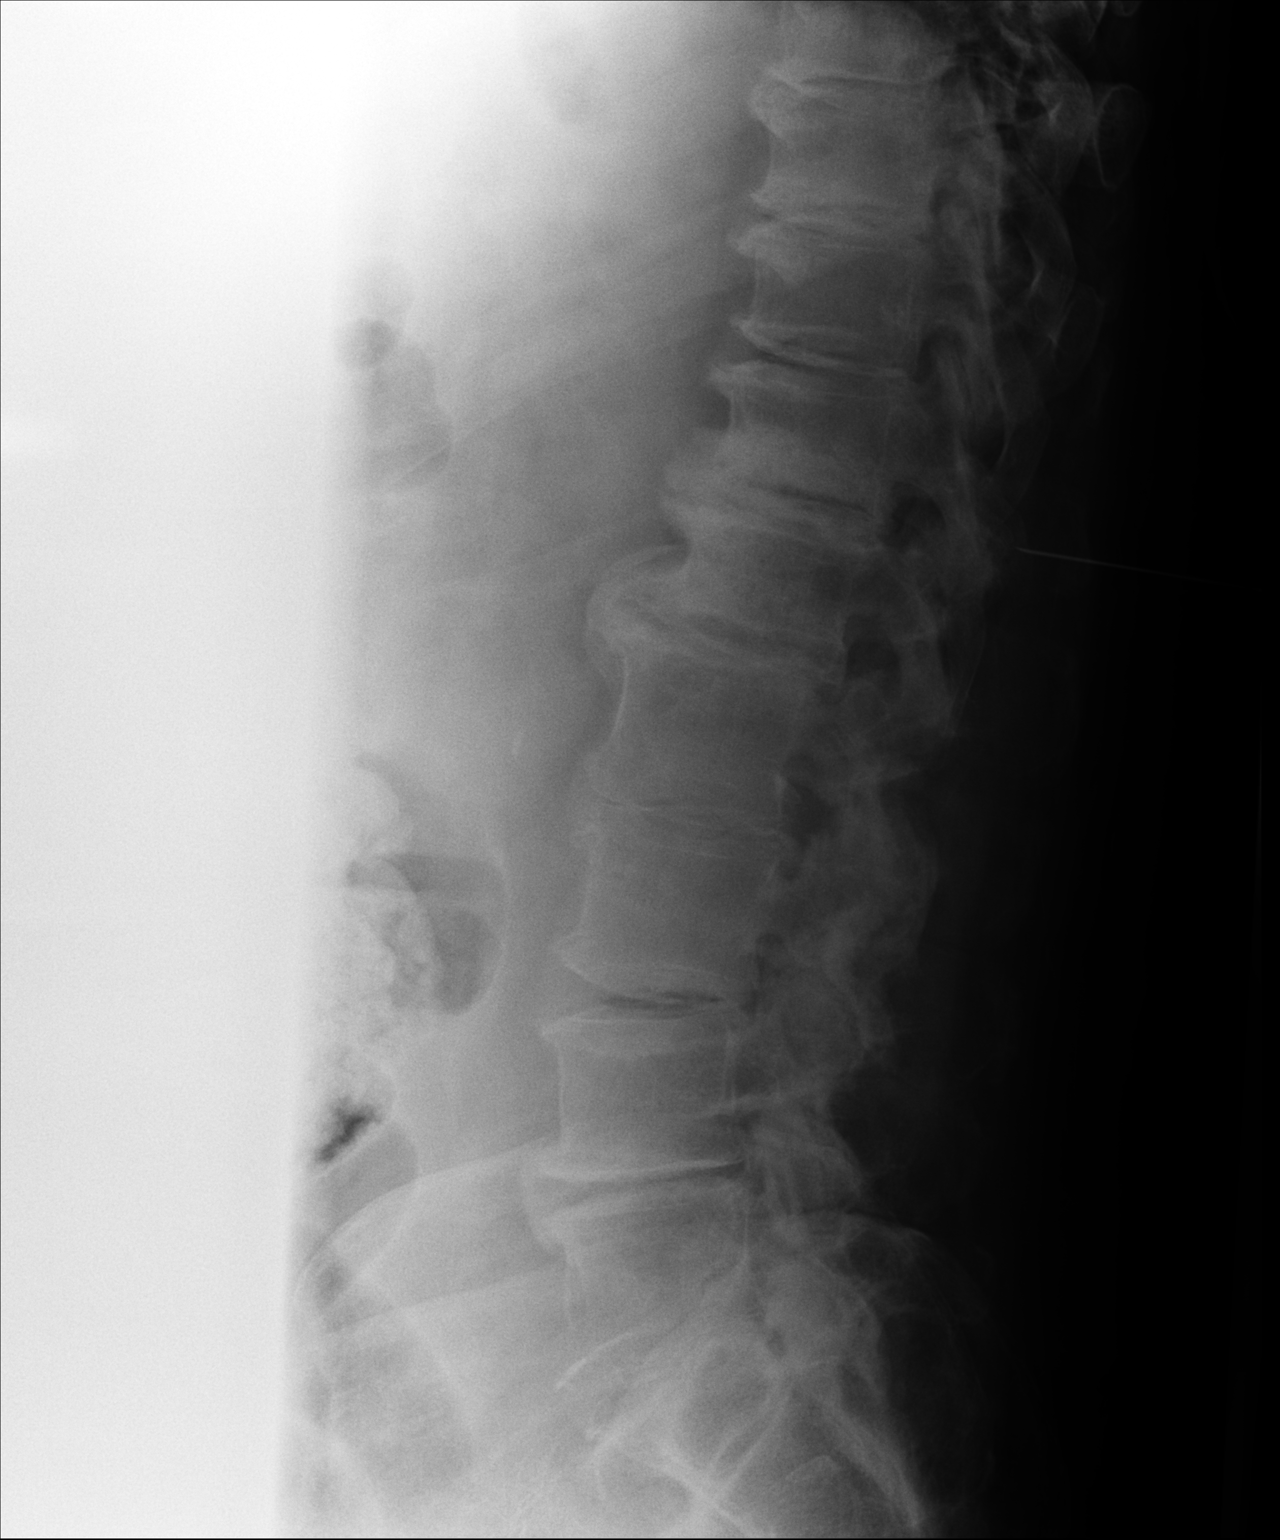

[lateral (2 of 2)]
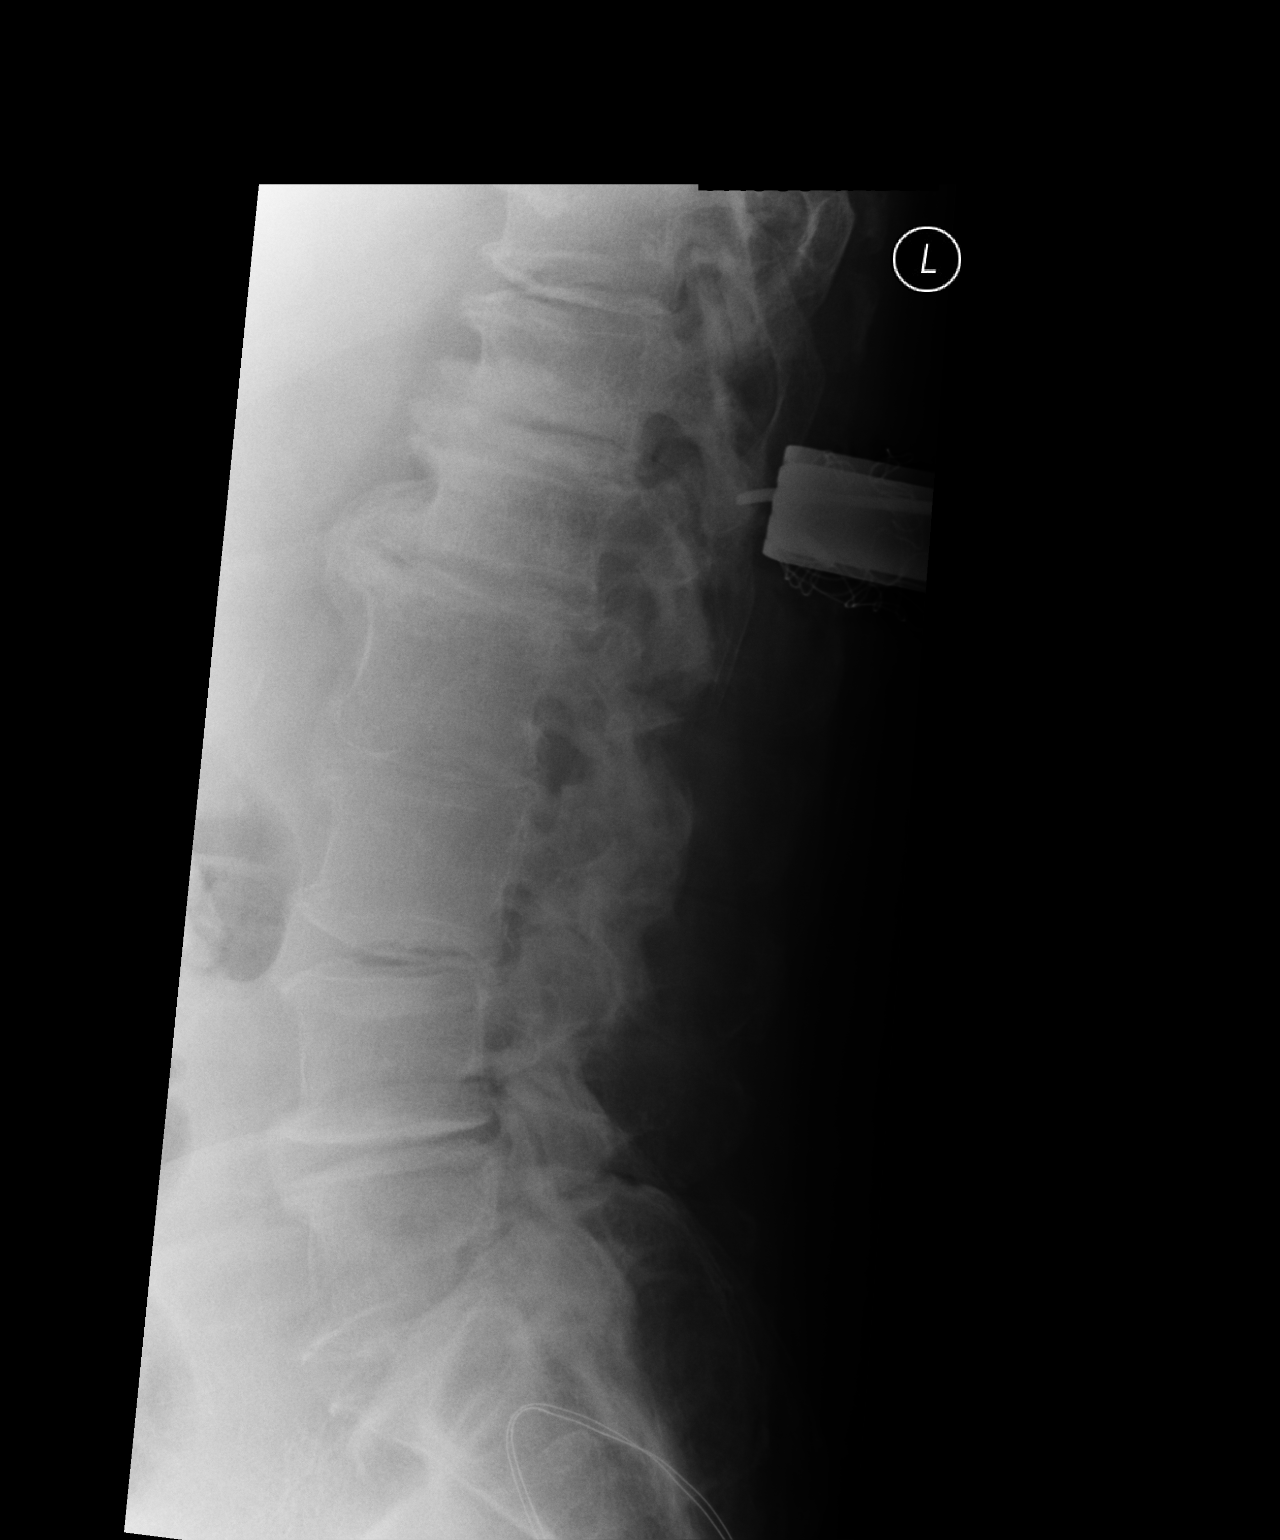

[2 of 2 positions shown; findings below may reference images not displayed]

FINDINGS: Lumbar spine numbered as per prior MRI. On both acquired images
metallic markers noted posteriorly at the T12-L1 level.
IMPRESSION: Metallic marker noted posterior to the T12-L1 level.

## 2019-02-05 DIAGNOSIS — R300 Dysuria: Secondary | ICD-10-CM | POA: Diagnosis not present

## 2019-02-06 DIAGNOSIS — H6501 Acute serous otitis media, right ear: Secondary | ICD-10-CM | POA: Diagnosis not present

## 2019-02-06 DIAGNOSIS — H903 Sensorineural hearing loss, bilateral: Secondary | ICD-10-CM | POA: Diagnosis not present

## 2019-02-08 DIAGNOSIS — R300 Dysuria: Secondary | ICD-10-CM | POA: Diagnosis not present

## 2019-02-08 DIAGNOSIS — N390 Urinary tract infection, site not specified: Secondary | ICD-10-CM | POA: Diagnosis not present

## 2019-02-08 DIAGNOSIS — I10 Essential (primary) hypertension: Secondary | ICD-10-CM | POA: Diagnosis not present

## 2019-02-08 DIAGNOSIS — H6591 Unspecified nonsuppurative otitis media, right ear: Secondary | ICD-10-CM | POA: Diagnosis not present

## 2019-02-11 ENCOUNTER — Ambulatory Visit: Payer: Medicare Other | Admitting: Neurology

## 2019-02-11 DIAGNOSIS — E669 Obesity, unspecified: Secondary | ICD-10-CM

## 2019-02-11 DIAGNOSIS — R0683 Snoring: Secondary | ICD-10-CM

## 2019-02-11 DIAGNOSIS — R0681 Apnea, not elsewhere classified: Secondary | ICD-10-CM

## 2019-02-11 DIAGNOSIS — G4733 Obstructive sleep apnea (adult) (pediatric): Secondary | ICD-10-CM | POA: Diagnosis not present

## 2019-02-11 DIAGNOSIS — Z9189 Other specified personal risk factors, not elsewhere classified: Secondary | ICD-10-CM

## 2019-02-11 DIAGNOSIS — Z820 Family history of epilepsy and other diseases of the nervous system: Secondary | ICD-10-CM

## 2019-02-11 DIAGNOSIS — G4719 Other hypersomnia: Secondary | ICD-10-CM

## 2019-02-11 DIAGNOSIS — R351 Nocturia: Secondary | ICD-10-CM

## 2019-02-12 DIAGNOSIS — R350 Frequency of micturition: Secondary | ICD-10-CM | POA: Diagnosis not present

## 2019-02-12 DIAGNOSIS — R351 Nocturia: Secondary | ICD-10-CM | POA: Diagnosis not present

## 2019-02-12 DIAGNOSIS — R310 Gross hematuria: Secondary | ICD-10-CM | POA: Diagnosis not present

## 2019-02-18 ENCOUNTER — Telehealth: Payer: Self-pay

## 2019-02-18 NOTE — Addendum Note (Signed)
Addended by: Star Age on: 02/18/2019 11:24 AM   Modules accepted: Orders

## 2019-02-18 NOTE — Progress Notes (Signed)
Patient referred by Dr. Osborne Casco, seen by me on 10/25/18, HST on 02/11/19.    Please call and notify the patient that the recent home sleep test showed obstructive sleep apnea in the severe range. While I recommend treatment for this in the form CPAP, he had indicated that he does not sleep well outside his home, had recent back surgery. To that end, I recommendf autoPAP, which means, that we don't have to bring him in for a sleep study with CPAP, but will let him start using an autoPAP machine at home, through a DME company (of his choice, or as per insurance requirement). The DME representative will educate him on how to use the machine, how to put the mask on, etc. I have placed an order in the chart. Please send referral, talk to patient, send report to referring MD. We will need a FU in sleep clinic for 10 weeks post-PAP set up, please arrange that with me or one of our NPs. Thanks,   Star Age, MD, PhD [facility]

## 2019-02-18 NOTE — Telephone Encounter (Signed)
I spoke with pt's wife, Izora Gala, per DPR. I advised pt's wife that Dr. Rexene Alberts reviewed their sleep study results and found that pt has severe osa. Dr. Rexene Alberts recommends that pt start an auto pap at home. I reviewed PAP compliance expectations with the pt's wife. Pt's wife is agreeable to starting an auto-PAP. I advised pt's wife that an order will be sent to a DME, [facility], and [facility] will call the pt within about one week after they file with the pt's insurance. [facility] will show the pt how to use the machine, fit for masks, and troubleshoot the auto-PAP if needed. A follow up appt was made for insurance purposes with Janett Billow, NP on 05/23/2019 at 1:45pm. Pt's wife verbalized understanding to arrive 15 minutes early and bring their auto-PAP. A letter with all of this information in it will be mailed to the pt as a reminder. I verified with the pt that the address we have on file is correct. Pt's wife verbalized understanding of results. Pt's wife had no questions at this time but was encouraged to call back if questions arise. I have sent the order to [facility] and have received confirmation that they have received the order.

## 2019-02-18 NOTE — Procedures (Signed)
[facility] [address], [place] [address] NAME:  Todd Gonzales                                                                      DOB: [birth year] MEDICAL RECORD no:   [id number]                                              DOS: 02/11/2019 REFERRING PHYSICIAN: Domenick Gong, MD STUDY PERFORMED: [facility] on [employer] HISTORY: 78 year old man with a history of hypertension, hyperlipidemia, BPH, arthritis, spinal stenosis, and obesity, who reports snoring and excessive daytime somnolence. He had sleep study testing several years ago but did not pursue CPAP therapy at the time. His Epworth sleepiness score is 12 out of 24, BMI of 34.3.  STUDY RESULTS:   Total Recording Time: 8 hrs, 24 mins; Total Sleep Time:  7 hrs, 10 mins Total Apnea/Hypopnea Index (AHI): 58.6/h; RDI: 58.6 /h; REM AHI: 67.2/h Average Oxygen Saturation:  93 %; Lowest Oxygen Desaturation: 70%  Total Time Oxygen Saturation Below or at 88 %: 60.4 minutes  Average Heart Rate:  64 bpm IMPRESSION: OSA RECOMMENDATION: This home sleep test demonstrates severe obstructive sleep apnea with a total AHI of 58.6/hour and O2 nadir of 70%. Treatment with positive airway pressure (in the form of CPAP) is recommended. This would, ideally, require a full night CPAP titration study for proper treatment settings, O2 monitoring and mask fitting. Based on the severity of the sleep disordered breathing, an attended titration study is indicated. However, the patient reports difficulty sleeping outside his home and for now, he will be advised to proceed with an autoPAP titration/trial at home. Please note that untreated obstructive sleep apnea may carry additional perioperative morbidity. Patients with significant obstructive sleep apnea should receive perioperative PAP therapy and the surgeons and particularly the anesthesiologist should be informed of the diagnosis and the severity of the sleep disordered breathing. The  patient should be cautioned not to drive, work at heights, or operate dangerous or heavy equipment when tired or sleepy. Review and reiteration of good sleep hygiene measures should be pursued with any patient. Other causes of the patient's symptoms, including circadian rhythm disturbances, an underlying mood disorder, medication effect and/or an underlying medical problem cannot be ruled out based on this test. Clinical correlation is recommended. The patient and her referring provider will be notified of the test results. The patient will be seen in follow up in sleep clinic at [facility]. I certify that I have reviewed the raw data recording prior to the issuance of this report in accordance with the standards of the American Academy of Sleep Medicine (AASM).  Star Age, MD, PhD [facility]) Diplomat, ABPN (Neurology and Sleep)

## 2019-02-18 NOTE — Telephone Encounter (Signed)
-----   Message from Star Age, MD sent at 02/18/2019 11:24 AM EDT ----- Patient referred by Dr. Osborne Casco, seen by me on 10/25/18, HST on 02/11/19.    Please call and notify the patient that the recent home sleep test showed obstructive sleep apnea in the severe range. While I recommend treatment for this in the form CPAP, he had indicated that he does not sleep well outside his home, had recent back surgery. To that end, I recommendf autoPAP, which means, that we don't have to bring him in for a sleep study with CPAP, but will let him start using an autoPAP machine at home, through a DME company (of his choice, or as per insurance requirement). The DME representative will educate him on how to use the machine, how to put the mask on, etc. I have placed an order in the chart. Please send referral, talk to patient, send report to referring MD. We will need a FU in sleep clinic for 10 weeks post-PAP set up, please arrange that with me or one of our NPs. Thanks,   Star Age, MD, PhD [facility])

## 2019-02-19 DIAGNOSIS — R310 Gross hematuria: Secondary | ICD-10-CM | POA: Diagnosis not present

## 2019-02-20 DIAGNOSIS — R350 Frequency of micturition: Secondary | ICD-10-CM | POA: Diagnosis not present

## 2019-02-20 DIAGNOSIS — M19011 Primary osteoarthritis, right shoulder: Secondary | ICD-10-CM | POA: Diagnosis not present

## 2019-02-20 DIAGNOSIS — R310 Gross hematuria: Secondary | ICD-10-CM | POA: Diagnosis not present

## 2019-02-20 DIAGNOSIS — M170 Bilateral primary osteoarthritis of knee: Secondary | ICD-10-CM | POA: Diagnosis not present

## 2019-02-27 DIAGNOSIS — M170 Bilateral primary osteoarthritis of knee: Secondary | ICD-10-CM | POA: Diagnosis not present

## 2019-03-06 DIAGNOSIS — M170 Bilateral primary osteoarthritis of knee: Secondary | ICD-10-CM | POA: Diagnosis not present

## 2019-03-27 DIAGNOSIS — M48062 Spinal stenosis, lumbar region with neurogenic claudication: Secondary | ICD-10-CM | POA: Diagnosis not present

## 2019-04-10 NOTE — Telephone Encounter (Signed)
Received this notice from [facility]: "We spoke to this patient's wife and she is wanting to call back to schedule due to COVID-19. She declined a telepap visit as well. We will wait to hear back from her. Depending on when that is, we may need new orders and notes."

## 2019-05-22 ENCOUNTER — Telehealth: Payer: Self-pay

## 2019-05-22 NOTE — Telephone Encounter (Signed)
LEft 2nd vm for patient that appt will be cancel for tomorrow. VM was left 6/17 that visit will be mychart video due to covid, and he will need to r/s with JEssica NP or MEgan NP.

## 2019-05-22 NOTE — Telephone Encounter (Signed)
Left vm for patient to call back that visit tomorrow will be change to mychart video due to COVID 19 pandemic. Left vm for pt to call back so we can email the link or text it to him to get set up for mychart video visit.

## 2019-05-23 ENCOUNTER — Ambulatory Visit: Payer: Self-pay | Admitting: Adult Health

## 2019-06-04 DIAGNOSIS — M7711 Lateral epicondylitis, right elbow: Secondary | ICD-10-CM | POA: Diagnosis not present

## 2019-06-04 DIAGNOSIS — M170 Bilateral primary osteoarthritis of knee: Secondary | ICD-10-CM | POA: Diagnosis not present

## 2019-06-17 ENCOUNTER — Ambulatory Visit (INDEPENDENT_AMBULATORY_CARE_PROVIDER_SITE_OTHER): Payer: Medicare Other | Admitting: Podiatry

## 2019-06-17 ENCOUNTER — Encounter: Payer: Self-pay | Admitting: Podiatry

## 2019-06-17 ENCOUNTER — Other Ambulatory Visit: Payer: Self-pay

## 2019-06-17 VITALS — Temp 98.0°F

## 2019-06-17 DIAGNOSIS — B351 Tinea unguium: Secondary | ICD-10-CM

## 2019-06-17 DIAGNOSIS — M79676 Pain in unspecified toe(s): Secondary | ICD-10-CM | POA: Diagnosis not present

## 2019-06-17 NOTE — Progress Notes (Signed)
Complaint:  Visit Type: Patient returns to my office for continued preventative foot care services. Complaint: Patient states" my nails have grown long and thick and become painful to walk and wear shoes" . The patient presents for preventative foot care services. No changes to ROS.  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  Podiatric Exam: Vascular: dorsalis pedis and posterior tibial pulses are palpable bilateral. Capillary return is immediate. Temperature gradient is WNL. Skin turgor WNL  Sensorium: Normal Semmes Weinstein monofilament test. Normal tactile sensation bilaterally. Nail Exam: Pt has thick disfigured discolored nails with subungual debris noted bilateral entire nail hallux through fifth toenails Ulcer Exam: There is no evidence of ulcer or pre-ulcerative changes or infection. Orthopedic Exam: Muscle tone and strength are WNL. No limitations in general ROM. No crepitus or effusions noted. Foot type and digits show no abnormalities. HAV  Left foot.  Hammer toes  B/L. Skin: No Porokeratosis. No infection or ulcers  Diagnosis:  Onychomycosis, , Pain in right toe, pain in left toes  Treatment & Plan Procedures and Treatment: Consent by patient was obtained for treatment procedures.   Debridement of mycotic and hypertrophic toenails, 1 through 5 bilateral and clearing of subungual debris. No ulceration, no infection noted.  Return Visit-Office Procedure: Patient instructed to return to the office for a follow up visit 3 months for continued evaluation and treatment.    Gardiner Barefoot DPM

## 2019-08-19 ENCOUNTER — Other Ambulatory Visit: Payer: Self-pay

## 2019-08-19 ENCOUNTER — Encounter: Payer: Self-pay | Admitting: Podiatry

## 2019-08-19 ENCOUNTER — Ambulatory Visit: Payer: Medicare Other | Admitting: Podiatry

## 2019-08-19 DIAGNOSIS — B351 Tinea unguium: Secondary | ICD-10-CM | POA: Diagnosis not present

## 2019-08-19 DIAGNOSIS — M79676 Pain in unspecified toe(s): Secondary | ICD-10-CM

## 2019-08-19 NOTE — Progress Notes (Signed)
Complaint:  Visit Type: Patient returns to my office for continued preventative foot care services. Complaint: Patient states" my nails have grown long and thick and become painful to walk and wear shoes" . The patient presents for preventative foot care services. No changes to ROS.  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  Podiatric Exam: Vascular: dorsalis pedis and posterior tibial pulses are palpable bilateral. Capillary return is immediate. Temperature gradient is WNL. Skin turgor WNL  Sensorium: Normal Semmes Weinstein monofilament test. Normal tactile sensation bilaterally. Nail Exam: Pt has thick disfigured discolored nails with subungual debris noted bilateral entire nail hallux through fifth toenails Ulcer Exam: There is no evidence of ulcer or pre-ulcerative changes or infection. Orthopedic Exam: Muscle tone and strength are WNL. No limitations in general ROM. No crepitus or effusions noted. Foot type and digits show no abnormalities. HAV  Left foot.  Hammer toes  B/L. Skin: No Porokeratosis. No infection or ulcers  Diagnosis:  Onychomycosis, , Pain in right toe, pain in left toes  Treatment & Plan Procedures and Treatment: Consent by patient was obtained for treatment procedures.   Debridement of mycotic and hypertrophic toenails, 1 through 5 bilateral and clearing of subungual debris. No ulceration, no infection noted.  Return Visit-Office Procedure: Patient instructed to return to the office for a follow up visit 10 weeks  for continued evaluation and treatment.    Gardiner Barefoot DPM

## 2019-08-30 ENCOUNTER — Telehealth: Payer: Self-pay | Admitting: Internal Medicine

## 2019-08-30 NOTE — Telephone Encounter (Signed)
Best number [phone]  Pt had new pt appointment with dr Damita Dunnings on 08/23/2016 only see Dr Damita Dunnings one time..  Pt has been seeing Dr Domenick Gong in [place].  Pt wanted to see if he could re-establish with Dr Damita Dunnings.  Ok to schedule ?

## 2019-09-01 NOTE — Telephone Encounter (Signed)
Please get some extra details.  He had established care here previously but then went elsewhere for care in the meantime.  Please see what details you can get about that.  Please let me know.  Thanks.

## 2019-09-04 NOTE — Telephone Encounter (Signed)
Pt stated he established here but he still had cpx scheduled there and went to them. Pt kept going back to see DrTisovec he stated he was borderline diabetic.   But  He lives closer to [place] and would like to start coming here

## 2019-09-04 NOTE — Telephone Encounter (Signed)
Okay.  30-minute office visit when possible.  Thanks.

## 2019-09-10 NOTE — Telephone Encounter (Signed)
Appointment 10/29 Pt aware

## 2019-09-17 DIAGNOSIS — R7302 Impaired glucose tolerance (oral): Secondary | ICD-10-CM | POA: Diagnosis not present

## 2019-09-17 DIAGNOSIS — E7800 Pure hypercholesterolemia, unspecified: Secondary | ICD-10-CM | POA: Diagnosis not present

## 2019-09-17 DIAGNOSIS — E559 Vitamin D deficiency, unspecified: Secondary | ICD-10-CM | POA: Diagnosis not present

## 2019-09-17 DIAGNOSIS — Z125 Encounter for screening for malignant neoplasm of prostate: Secondary | ICD-10-CM | POA: Diagnosis not present

## 2019-09-17 DIAGNOSIS — Z23 Encounter for immunization: Secondary | ICD-10-CM | POA: Diagnosis not present

## 2019-09-17 LAB — LIPID PANEL
Cholesterol: 146 (ref 0–200)
HDL: 46 (ref 35–70)
LDL Cholesterol: 86
LDl/HDL Ratio: 3.2
Triglycerides: 69 (ref 40–160)

## 2019-09-17 LAB — BASIC METABOLIC PANEL
Creatinine: 0.9 (ref 0.6–1.3)
Glucose: 101

## 2019-09-17 LAB — CBC AND DIFFERENTIAL
Hemoglobin: 14.8 (ref 13.5–17.5)
Platelets: 176 (ref 150–399)
WBC: 5.5

## 2019-09-17 LAB — HEPATIC FUNCTION PANEL
ALT: 16 (ref 10–40)
AST: 17 (ref 14–40)
Bilirubin, Total: 1.9

## 2019-09-17 LAB — VITAMIN D 25 HYDROXY (VIT D DEFICIENCY, FRACTURES): Vit D, 25-Hydroxy: 26.2

## 2019-09-17 LAB — PSA: PSA: 2.935

## 2019-09-24 DIAGNOSIS — Z0000 Encounter for general adult medical examination without abnormal findings: Secondary | ICD-10-CM | POA: Diagnosis not present

## 2019-09-24 DIAGNOSIS — M48061 Spinal stenosis, lumbar region without neurogenic claudication: Secondary | ICD-10-CM | POA: Diagnosis not present

## 2019-09-24 DIAGNOSIS — M170 Bilateral primary osteoarthritis of knee: Secondary | ICD-10-CM | POA: Diagnosis not present

## 2019-09-24 DIAGNOSIS — E46 Unspecified protein-calorie malnutrition: Secondary | ICD-10-CM | POA: Diagnosis not present

## 2019-10-03 ENCOUNTER — Other Ambulatory Visit: Payer: Self-pay

## 2019-10-03 ENCOUNTER — Ambulatory Visit (INDEPENDENT_AMBULATORY_CARE_PROVIDER_SITE_OTHER): Payer: Medicare Other | Admitting: Family Medicine

## 2019-10-03 ENCOUNTER — Ambulatory Visit (INDEPENDENT_AMBULATORY_CARE_PROVIDER_SITE_OTHER)
Admission: RE | Admit: 2019-10-03 | Discharge: 2019-10-03 | Disposition: A | Discharge status: Home / Self Care | Payer: Medicare Other | Source: Ambulatory Visit | Attending: Family Medicine | Admitting: Family Medicine

## 2019-10-03 ENCOUNTER — Encounter: Payer: Self-pay | Admitting: Family Medicine

## 2019-10-03 VITALS — BP 142/82 | HR 87 | Temp 97.3°F | Ht 71.0 in | Wt 256.3 lb

## 2019-10-03 DIAGNOSIS — M79601 Pain in right arm: Secondary | ICD-10-CM | POA: Diagnosis not present

## 2019-10-03 DIAGNOSIS — M50322 Other cervical disc degeneration at C5-C6 level: Secondary | ICD-10-CM | POA: Diagnosis not present

## 2019-10-03 DIAGNOSIS — I10 Essential (primary) hypertension: Secondary | ICD-10-CM | POA: Diagnosis not present

## 2019-10-03 DIAGNOSIS — Z7189 Other specified counseling: Secondary | ICD-10-CM

## 2019-10-03 DIAGNOSIS — E559 Vitamin D deficiency, unspecified: Secondary | ICD-10-CM

## 2019-10-03 MED ORDER — VITAMIN D-1000 MAX ST 25 MCG (1000 UT) PO TABS: 2000.0000 [IU] | ORAL_TABLET | Freq: Every day | ORAL | Status: DC

## 2019-10-03 NOTE — Progress Notes (Signed)
To re-est care.  He lives closer to our clinic.    Hypertension:    Using medication without problems or lightheadedness: yes Chest pain with exertion:no Edema:no Short of breath:no Prev labs d/w pt.  He is trying to exercise, getting back with water aerobics.  He feels better with that.    Recent mildly low vit D and d/w pt about inc to 2000 IU a day.   Shoulder pain.  nsaid cautions d/w pt.  On celebrex.  Discussed using tylenol prn.  Prev injection done by ortho.  Some better with aerobics. No night pain.  No arm drop.  No trauma.  Only R sided, no L sided sx (other than old L frozen shoulder).  Pain can radiate to the R thumb.  He is going to follow-up with Dr. Ellene Route.  Discussed getting plain films of C-spine today.  See notes on imaging.  Advanced directive discussed with patient. Wife designated if patient were incapacitated.  Meds, vitals, and allergies reviewed.   PMH and SH reviewed  ROS: Per HPI unless specifically indicated in ROS section   GEN: nad, alert and oriented HEENT: ncat NECK: supple w/o LA CV: rrr. PULM: ctab, no inc wob ABD: soft, +bs EXT: no edema SKIN: no acute rash Normal grip bilaterally. Normal right shoulder range of motion.

## 2019-10-03 NOTE — Patient Instructions (Addendum)
Assuming you feel well, BP in the 140s/80s would be reasonable.  Check your BP at home and update me as needed.    Thanks for getting a flu shot.   Go to the lab on the way out.  We'll contact you with your xray report. We'll update Dr. Ellene Route.  Take care.  Glad to see you.

## 2019-10-10 ENCOUNTER — Encounter: Payer: Self-pay | Admitting: Family Medicine

## 2019-10-10 ENCOUNTER — Encounter: Payer: Self-pay | Admitting: Internal Medicine

## 2019-10-10 DIAGNOSIS — M79601 Pain in right arm: Secondary | ICD-10-CM | POA: Insufficient documentation

## 2019-10-10 DIAGNOSIS — E559 Vitamin D deficiency, unspecified: Secondary | ICD-10-CM | POA: Insufficient documentation

## 2019-10-10 NOTE — Assessment & Plan Note (Signed)
Pain can radiate to the R thumb.  He is going to follow-up with Dr. Ellene Route.  Discussed getting plain films of C-spine today.  See notes on imaging.  He agrees.  See above.

## 2019-10-10 NOTE — Assessment & Plan Note (Signed)
Advanced directive discussed with patient.  Wife designated if patient were incapacitated. ?

## 2019-10-10 NOTE — Assessment & Plan Note (Addendum)
No change in meds at this point.  Discussed exercise.  He will update me as needed.  Previous labs discussed with patient.  See after visit summary.>30 minutes spent in face to face time with patient, >50% spent in counselling or coordination of care.

## 2019-10-10 NOTE — Assessment & Plan Note (Signed)
Recent mildly low vit D and d/w pt about inc to 2000 IU a day.

## 2019-10-11 ENCOUNTER — Other Ambulatory Visit: Payer: Self-pay | Admitting: Family Medicine

## 2019-10-28 ENCOUNTER — Ambulatory Visit: Payer: Medicare Other | Admitting: Podiatry

## 2019-11-13 ENCOUNTER — Encounter: Payer: Self-pay | Admitting: Family Medicine

## 2019-11-15 ENCOUNTER — Other Ambulatory Visit: Payer: Self-pay | Admitting: Family Medicine

## 2019-11-15 MED ORDER — GABAPENTIN 100 MG PO CAPS: 300.0000 mg | ORAL_CAPSULE | Freq: Every day | ORAL | 1 refills | Status: DC

## 2019-12-04 ENCOUNTER — Encounter: Payer: Self-pay | Admitting: Family Medicine

## 2019-12-05 ENCOUNTER — Encounter: Payer: Self-pay | Admitting: Podiatry

## 2019-12-05 ENCOUNTER — Other Ambulatory Visit: Payer: Self-pay

## 2019-12-05 ENCOUNTER — Ambulatory Visit (INDEPENDENT_AMBULATORY_CARE_PROVIDER_SITE_OTHER): Payer: Medicare Other | Admitting: Podiatry

## 2019-12-05 DIAGNOSIS — M79676 Pain in unspecified toe(s): Secondary | ICD-10-CM | POA: Diagnosis not present

## 2019-12-05 DIAGNOSIS — B351 Tinea unguium: Secondary | ICD-10-CM

## 2019-12-05 NOTE — Progress Notes (Signed)
Complaint:  Visit Type: Patient returns to my office for continued preventative foot care services. Complaint: Patient states" my nails have grown long and thick and become painful to walk and wear shoes" . The patient presents for preventative foot care services. No changes to ROS.  Marland Kitchen  Podiatric Exam: Vascular: dorsalis pedis and posterior tibial pulses are palpable bilateral. Capillary return is immediate. Temperature gradient is WNL. Skin turgor WNL  Sensorium: Normal Semmes Weinstein monofilament test. Normal tactile sensation bilaterally. Nail Exam: Pt has thick disfigured discolored nails with subungual debris noted bilateral entire nail hallux through fifth toenails Ulcer Exam: There is no evidence of ulcer or pre-ulcerative changes or infection. Orthopedic Exam: Muscle tone and strength are WNL. No limitations in general ROM. No crepitus or effusions noted. Foot type and digits show no abnormalities. HAV  Left foot.  Hammer toes  B/L. Skin: No Porokeratosis. No infection or ulcers  Diagnosis:  Onychomycosis, , Pain in right toe, pain in left toes  Treatment & Plan Procedures and Treatment: Consent by patient was obtained for treatment procedures.   Debridement of mycotic and hypertrophic toenails, 1 through 5 bilateral and clearing of subungual debris. No ulceration, no infection noted. Cauterized 3,4 left subungually. Return Visit-Office Procedure: Patient instructed to return to the office for a follow up visit 10 weeks  for continued evaluation and treatment.    Gardiner Barefoot DPM

## 2019-12-10 DIAGNOSIS — M170 Bilateral primary osteoarthritis of knee: Secondary | ICD-10-CM | POA: Diagnosis not present

## 2019-12-13 DIAGNOSIS — M5412 Radiculopathy, cervical region: Secondary | ICD-10-CM | POA: Diagnosis not present

## 2019-12-18 ENCOUNTER — Other Ambulatory Visit: Payer: Self-pay | Admitting: Neurological Surgery

## 2019-12-18 DIAGNOSIS — M5412 Radiculopathy, cervical region: Secondary | ICD-10-CM

## 2019-12-31 ENCOUNTER — Other Ambulatory Visit: Payer: Self-pay | Admitting: Family Medicine

## 2019-12-31 NOTE — Telephone Encounter (Signed)
Electronic refill request. Gabapentin Last office visit:   10/03/2019 Last Filled:    90 capsule 1 11/15/2019  Please advise.

## 2020-01-01 ENCOUNTER — Other Ambulatory Visit: Payer: Self-pay | Admitting: Family Medicine

## 2020-01-01 NOTE — Telephone Encounter (Signed)
Please update patient.  Since he was taking 3 of the 100 mg pills at night, I changed it to one of the 300 mg pills.  I sent 90 pills but that will last him 90 days per prescription.  Make sure he is aware of the strength change though it is not a change in total daily dose.  Thanks.

## 2020-01-02 ENCOUNTER — Ambulatory Visit: Payer: Medicare Other

## 2020-01-02 NOTE — Telephone Encounter (Addendum)
Electronic refill request. Celebrex Last office visit:   10/03/2019 Last Filled:  No recent fills Please advise.

## 2020-01-02 NOTE — Telephone Encounter (Signed)
Sent. Thanks.   

## 2020-01-02 NOTE — Telephone Encounter (Signed)
Patient advised and understands 

## 2020-01-06 DIAGNOSIS — M170 Bilateral primary osteoarthritis of knee: Secondary | ICD-10-CM | POA: Diagnosis not present

## 2020-01-10 ENCOUNTER — Ambulatory Visit: Payer: Medicare Other

## 2020-01-13 DIAGNOSIS — M170 Bilateral primary osteoarthritis of knee: Secondary | ICD-10-CM | POA: Diagnosis not present

## 2020-01-18 ENCOUNTER — Inpatient Hospital Stay: Admission: RE | Admit: 2020-01-18 | Payer: Medicare Other | Source: Ambulatory Visit

## 2020-01-20 DIAGNOSIS — M170 Bilateral primary osteoarthritis of knee: Secondary | ICD-10-CM | POA: Diagnosis not present

## 2020-01-21 DIAGNOSIS — H353290 Exudative age-related macular degeneration, unspecified eye, stage unspecified: Secondary | ICD-10-CM | POA: Diagnosis not present

## 2020-01-21 DIAGNOSIS — H34832 Tributary (branch) retinal vein occlusion, left eye, with macular edema: Secondary | ICD-10-CM | POA: Diagnosis not present

## 2020-01-21 DIAGNOSIS — H2513 Age-related nuclear cataract, bilateral: Secondary | ICD-10-CM | POA: Diagnosis not present

## 2020-02-08 ENCOUNTER — Ambulatory Visit
Admission: RE | Admit: 2020-02-08 | Discharge: 2020-02-08 | Disposition: A | Discharge status: Home / Self Care | Payer: Medicare Other | Source: Ambulatory Visit | Attending: Neurological Surgery | Admitting: Neurological Surgery

## 2020-02-08 ENCOUNTER — Other Ambulatory Visit: Payer: Self-pay

## 2020-02-08 DIAGNOSIS — M4802 Spinal stenosis, cervical region: Secondary | ICD-10-CM | POA: Diagnosis not present

## 2020-02-08 DIAGNOSIS — M5412 Radiculopathy, cervical region: Secondary | ICD-10-CM

## 2020-02-13 ENCOUNTER — Other Ambulatory Visit: Payer: Self-pay

## 2020-02-13 ENCOUNTER — Ambulatory Visit: Payer: Medicare Other | Admitting: Podiatry

## 2020-02-13 ENCOUNTER — Encounter: Payer: Self-pay | Admitting: Podiatry

## 2020-02-13 VITALS — Temp 97.7°F

## 2020-02-13 DIAGNOSIS — B351 Tinea unguium: Secondary | ICD-10-CM

## 2020-02-13 DIAGNOSIS — M79676 Pain in unspecified toe(s): Secondary | ICD-10-CM | POA: Diagnosis not present

## 2020-02-13 NOTE — Progress Notes (Signed)
This patient returns to the office for evaluation and treatment of long thick painful nails .  This patient is unable to trim his own nails since the patient cannot reach the feet.  Patient says the nails are painful walking and wearing his shoes.  He returns for preventive foot care services.  General Appearance  Alert, conversant and in no acute stress.  Vascular  Dorsalis pedis and posterior tibial  pulses are palpable  bilaterally.  Capillary return is within normal limits  bilaterally. Temperature is within normal limits  bilaterally.  Neurologic  Senn-Weinstein monofilament wire test within normal limits  bilaterally. Muscle power within normal limits bilaterally.  Nails Thick disfigured discolored nails with subungual debris  from hallux to fifth toes bilaterally. No evidence of bacterial infection or drainage bilaterally.  Orthopedic  No limitations of motion  feet .  No crepitus or effusions noted.  HAV left foot..  Hammer toes  B/L.  Skin  normotropic skin with no porokeratosis noted bilaterally.  No signs of infections or ulcers noted.     Onychomycosis  Pain in toes right foot  Pain in toes left foot  Debridement  of nails  1-5  B/L with a nail nipper.  Nails were then filed using a dremel tool with no incidents.    RTC 10 weeks   Gardiner Barefoot DPM

## 2020-02-17 DIAGNOSIS — M5412 Radiculopathy, cervical region: Secondary | ICD-10-CM | POA: Diagnosis not present

## 2020-02-24 DIAGNOSIS — M19011 Primary osteoarthritis, right shoulder: Secondary | ICD-10-CM | POA: Diagnosis not present

## 2020-03-11 ENCOUNTER — Other Ambulatory Visit: Payer: Self-pay | Admitting: Family Medicine

## 2020-03-12 NOTE — Telephone Encounter (Signed)
Electronic refill request. Gabapentin Last office visit:   10/03/2019 Last Filled:    90 capsule 1 01/01/2020  Please advise.

## 2020-03-13 NOTE — Telephone Encounter (Signed)
Sent. Thanks.   

## 2020-03-17 ENCOUNTER — Telehealth: Payer: Self-pay | Admitting: Family Medicine

## 2020-03-17 DIAGNOSIS — M79601 Pain in right arm: Secondary | ICD-10-CM

## 2020-03-17 NOTE — Telephone Encounter (Signed)
Pt called in and asked if [name] would put in a referral for him to go to [facility] to see Dr. Jannifer Franklin. He is having pain in his arm and shoulder muscles and think he needs to see specialist for it.

## 2020-03-18 NOTE — Telephone Encounter (Signed)
Advised pt of referral and PCP msg. Pt said he will contact Dr. Tobey Gonzales office.

## 2020-03-18 NOTE — Telephone Encounter (Signed)
We can refer him and I put in the order.  If he is having mechanical issues/nerve compression he may end up needing to talk to neurosurgery again.  I included the results of his most recent MRI below.  If he is clearly worse in the meantime then I would not put off evaluation-I do not know how quickly Dr. Jannifer Franklin will be able to get him in clinic.  Thanks.  Cervical spondylosis has progressed at several levels from prior MRI 11/19/2014. Findings are most notably as follows.  At C3-C4, multifactorial moderate spinal canal stenosis has progressed. Moderate right neural foraminal narrowing has also progressed. Unchanged mild left neural foraminal narrowing.  No more than mild spinal canal stenosis at the remaining levels.  Multilevel neural foraminal narrowing greatest on the right at C3-C4 (moderate/severe, progressed), on the left at C4-C5 (moderate/severe, progressed), on the left at C5-C6 (moderate/severe, progressed) and bilaterally at C6-C7 (severe). Additional sites of mild and moderate foraminal stenosis as described.  At T1-T2, there is a new central disc extrusion with cranial migration. This contributes to mild/moderate spinal canal stenosis.

## 2020-03-19 ENCOUNTER — Telehealth: Payer: Self-pay | Admitting: Neurology

## 2020-03-19 NOTE — Telephone Encounter (Signed)
Fine with me, I have seen him once for sleep eval.

## 2020-03-19 NOTE — Telephone Encounter (Signed)
Patient has been referred back to [place] for right arm pain. He has seen Dr. Rexene Alberts in the past (for sleep), but would like to see Dr. Jannifer Franklin for this. Would you both be ok with this?

## 2020-03-26 ENCOUNTER — Ambulatory Visit: Payer: Medicare Other | Admitting: Family Medicine

## 2020-03-26 ENCOUNTER — Ambulatory Visit (INDEPENDENT_AMBULATORY_CARE_PROVIDER_SITE_OTHER)
Admission: RE | Admit: 2020-03-26 | Discharge: 2020-03-26 | Disposition: A | Discharge status: Home / Self Care | Payer: Medicare Other | Source: Ambulatory Visit | Attending: Family Medicine | Admitting: Family Medicine

## 2020-03-26 ENCOUNTER — Encounter: Payer: Self-pay | Admitting: Family Medicine

## 2020-03-26 ENCOUNTER — Other Ambulatory Visit: Payer: Self-pay

## 2020-03-26 VITALS — BP 136/74 | HR 84 | Temp 98.0°F | Ht 71.0 in | Wt 237.4 lb

## 2020-03-26 DIAGNOSIS — R252 Cramp and spasm: Secondary | ICD-10-CM | POA: Diagnosis not present

## 2020-03-26 DIAGNOSIS — R739 Hyperglycemia, unspecified: Secondary | ICD-10-CM | POA: Diagnosis not present

## 2020-03-26 DIAGNOSIS — M19041 Primary osteoarthritis, right hand: Secondary | ICD-10-CM | POA: Diagnosis not present

## 2020-03-26 DIAGNOSIS — M79646 Pain in unspecified finger(s): Secondary | ICD-10-CM | POA: Diagnosis not present

## 2020-03-26 LAB — POCT GLYCOSYLATED HEMOGLOBIN (HGB A1C): Hemoglobin A1C: 5.2 % (ref 4.0–5.6)

## 2020-03-26 NOTE — Progress Notes (Signed)
This visit occurred during the SARS-CoV-2 public health emergency.  Safety protocols were in place, including screening questions prior to the visit, additional usage of staff PPE, and extensive cleaning of exam room while observing appropriate contact time as indicated for disinfecting solutions.  He had R shoulder injection per ortho.  He has L frozen shoulder and R mid humeral pain near the insertion of the deltoid with ROM of the shoulder.    He has seen Dr. Ellene Route in the meantime with eval with Dr. Jannifer Franklin pending.  He still has pain moving down to the R thumb. Gabapentin helps with PM thumb pain, less pain at night.    He was asking about supplements for cramping.  He has some L leg cramping when getting out of bed, cramping in the calf.  No claudication.  Going on for years.  Discussed stretching and water intake.    H/o hyperglycemia, dw pt about checking A1c today.  See notes on labs.  Meds, vitals, and allergies reviewed.   ROS: Per HPI unless specifically indicated in ROS section   nad ncat Neck supple, no LA rrr ctab Intact DP and PT pulses B, symmetric.  R hand with chronic IP joint changes but not painful at time of exam with ROM. No erythema.   L frozen shoulder at baseline.

## 2020-03-26 NOTE — Patient Instructions (Addendum)
Go to the lab on the way out.   If you have mychart we'll likely use that to update you.   Take care.  Glad to see you. Update me as needed.  I'll await the notes from Dr. Jannifer Franklin.

## 2020-03-27 ENCOUNTER — Encounter: Payer: Self-pay | Admitting: Family Medicine

## 2020-03-31 DIAGNOSIS — M79646 Pain in unspecified finger(s): Secondary | ICD-10-CM | POA: Insufficient documentation

## 2020-03-31 DIAGNOSIS — R252 Cramp and spasm: Secondary | ICD-10-CM | POA: Insufficient documentation

## 2020-03-31 NOTE — Assessment & Plan Note (Signed)
No claudication and intact dorsalis pedis and posterior tibial pulses.  Discussed stretching and adequate water intake and he will update me as needed.

## 2020-03-31 NOTE — Assessment & Plan Note (Signed)
See notes on follow-up A1c.  No change in medications at this point.

## 2020-03-31 NOTE — Assessment & Plan Note (Signed)
He has some improvement with gabapentin and he has follow-up with Dr. Jannifer Franklin pending.  I will await the consult note.  Differential discussed and reasonable to check x-ray to evaluate for osteoarthritis.  See notes on imaging.

## 2020-04-23 ENCOUNTER — Ambulatory Visit: Payer: Medicare Other | Admitting: Podiatry

## 2020-05-01 ENCOUNTER — Other Ambulatory Visit: Payer: Self-pay

## 2020-05-01 ENCOUNTER — Encounter: Payer: Self-pay | Admitting: Neurology

## 2020-05-01 ENCOUNTER — Ambulatory Visit (INDEPENDENT_AMBULATORY_CARE_PROVIDER_SITE_OTHER): Payer: Medicare Other | Admitting: Neurology

## 2020-05-01 VITALS — BP 113/57 | HR 88 | Ht 70.0 in | Wt 234.0 lb

## 2020-05-01 DIAGNOSIS — M79601 Pain in right arm: Secondary | ICD-10-CM

## 2020-05-01 NOTE — Progress Notes (Signed)
Reason for visit: Right arm pain  Referring physician: Dr. Volney Presser is a 79 y.o. male  History of present illness:  [name] is an 79 year old right-handed white male with a history of right shoulder and upper arm discomfort over the last 3 to 4 years that has gradually worsened over time.  He denies any neck pain, he does report that he has no discomfort when he is resting his arm but if he tries to elevate arm he has pain.  He has been seen by Dr. Redmond Pulling from orthopedic surgery in the past for a frozen shoulder on the left side, the has very little use of the left arm, he mainly uses the right arm to do activities of daily living.  He reports no discomfort down the arm with turning his head from side to side or looking up or looking down.  He denies any problems with back pain or pain down the legs.  He has had bilateral total hip replacements and he does have bilateral knee arthritis as well.  He denies any significant changes in balance or difficulty controlling the bowels or the bladder.  He reports no numbness of the extremities.  He comes to the office today for evaluation.  He has had a recent MRI of the cervical spine.  He is followed by Dr. Ellene Route.  Past Medical History:  Diagnosis Date  . Arthritis   . BPH (benign prostatic hyperplasia)    nocturia x4 at baseline  . H/O hiatal hernia   . Hemorrhoids   . Hyperlipidemia   . Hypertension    Dr Osborne Casco- LOV with clearance 3/13 on chart  . IGT (impaired glucose tolerance)    last AIC 5.5- diet controlled- per office note 3/13  . Seasonal allergies   . Spinal stenosis   . UTI (lower urinary tract infection)    required admission to [facility]    Past Surgical History:  Procedure Laterality Date  . BACK SURGERY  2008   Laminectomy L4-5  . JOINT REPLACEMENT Bilateral    bil  . KNEE ARTHROSCOPY     right  . LUMBAR LAMINECTOMY/DECOMPRESSION MICRODISCECTOMY Bilateral 11/20/2018   Procedure: Bilateral Thoracic Twelve  to Lumbar One Laminectomy;  Surgeon: Kristeen Miss, MD;  Location: [facility];  Service: Neurosurgery;  Laterality: Bilateral;  posterior  . TOTAL HIP ARTHROPLASTY  03/20/2012   Procedure: TOTAL HIP ARTHROPLASTY ANTERIOR APPROACH;  Surgeon: Mauri Pole, MD;  Location: [facility];  Service: Orthopedics;  Laterality: Left;    Family History  Problem Relation Age of Onset  . Arthritis Mother   . Lung cancer Father   . Asthma Father   . Colon cancer Neg Hx   . Prostate cancer Neg Hx     Social history:  reports that he has never smoked. He has never used smokeless tobacco. He reports current alcohol use. He reports that he does not use drugs.  Medications:  Prior to Admission medications   Medication Sig Start Date End Date Taking? Authorizing Provider  benazepril-hydrochlorthiazide (LOTENSIN HCT) 20-12.5 MG tablet Take 0.5 tablets by mouth daily after breakfast. 08/25/16  Yes Tonia Ghent, MD  celecoxib (CELEBREX) 200 MG capsule TAKE 1 CAPSULE BY MOUTH TWICE DAILY 01/02/20  Yes Tonia Ghent, MD  Cholecalciferol (VITAMIN D-1000 MAX ST) 25 MCG (1000 UT) tablet Take 2 tablets (2,000 Units total) by mouth daily. 10/03/19  Yes Tonia Ghent, MD  gabapentin (NEURONTIN) 300 MG capsule TAKE 1  CAPSULE BY MOUTH ONCE DAILY 03/13/20  Yes Tonia Ghent, MD  oxymetazoline (AFRIN) 0.05 % nasal spray Place 2 sprays into the nose at bedtime as needed for congestion (allergies).    Yes [provider]  sodium chloride (OCEAN) 0.65 % nasal spray Place 1 spray into the nose daily as needed for congestion.    Yes [provider]  tamsulosin (FLOMAX) 0.4 MG CAPS capsule TAKE 1 CAPSULE BY MOUTH ONCE DAILY 10/11/19  Yes Tonia Ghent, MD      Allergies  Allergen Reactions  . Codeine Other (See Comments)    Unknown reaction    ROS:  Out of a complete 14 system review of symptoms, the patient complains only of the following symptoms, and all other reviewed systems are  negative.  Right shoulder pain frozen shoulder, left  Blood pressure (!) 113/57, pulse 88, height 5\' 10"  (1.778 m), weight 234 lb (106.1 kg).  Physical Exam  General: The patient is alert and cooperative at the time of the examination.  The patient is moderately obese.  Eyes: Pupils are equal, round, and reactive to light. Discs are flat bilaterally.  Neck: The neck is supple, no carotid bruits are noted.  Respiratory: The respiratory examination is clear.  Cardiovascular: The cardiovascular examination reveals a regular rate and rhythm, no obvious murmurs or rubs are noted.  Neuromuscular: The patient appears to have a dropped head to some degree, he has about 25 degrees of limitation of movement with rotation to the left and to the right of the cervical spine.  The patient has incomplete abduction of the right arm, lacking about 15 to 20 degrees.  He is significantly limited in rotational mobility of the shoulder.  The left arm can be elevated only about 45 degrees, passively can be elevated above his head but rotation mobility of the left shoulder is almost 0.  Skin: Extremities are without significant edema.  Neurologic Exam  Mental status: The patient is alert and oriented x 3 at the time of the examination. The patient has apparent normal recent and remote memory, with an apparently normal attention span and concentration ability.  Cranial nerves: Facial symmetry is present. There is good sensation of the face to pinprick and soft touch bilaterally. The strength of the facial muscles and the muscles to head turning and shoulder shrug are normal bilaterally. Speech is well enunciated, no aphasia or dysarthria is noted. Extraocular movements are full. Visual fields are full. The tongue is midline, and the patient has symmetric elevation of the soft palate. No obvious hearing deficits are noted.  Motor: The motor testing reveals 5 over 5 strength of all 4 extremities. Good symmetric  motor tone is noted throughout.  Sensory: Sensory testing is intact to pinprick, soft touch, vibration sensation, and position sense on all 4 extremities. No evidence of extinction is noted.  Coordination: Cerebellar testing reveals good finger-nose-finger and heel-to-shin bilaterally.  Gait and station: Gait is normal. Tandem gait is unsteady. Romberg is negative. No drift is seen.  Reflexes: Deep tendon reflexes are symmetric, but are depressed bilaterally. Toes are downgoing bilaterally.   MRI cervical 02/09/20:  IMPRESSION: Cervical spondylosis has progressed at several levels from prior MRI 11/19/2014. Findings are most notably as follows.  At C3-C4, multifactorial moderate spinal canal stenosis has progressed. Moderate right neural foraminal narrowing has also progressed. Unchanged mild left neural foraminal narrowing.  No more than mild spinal canal stenosis at the remaining levels.  Multilevel neural foraminal narrowing greatest  on the right at C3-C4 (moderate/severe, progressed), on the left at C4-C5 (moderate/severe, progressed), on the left at C5-C6 (moderate/severe, progressed) and bilaterally at C6-C7 (severe). Additional sites of mild and moderate foraminal stenosis as described.  At T1-T2, there is a new central disc extrusion with cranial migration. This contributes to mild/moderate spinal canal stenosis.   Assessment/Plan:  1.  Right arm pain, intrinsic shoulder arthritis  2.  Frozen shoulder, left  The patient has pain in the left shoulder and upper arm related to his shoulder joint, there is no evidence on clinical examination and by history that he has a cervical radiculopathy.  I see no indication for any further neurologic evaluation.  He will follow-up with his orthopedic surgeon.   Jill Alexanders MD 05/01/2020 8:43 AM  [facility] [address] [place], [place] [address]  Phone [phone] Fax  [phone]

## 2020-05-25 IMAGING — DX DG HAND COMPLETE 3+V*R*
3 series · 3 of 3 positions shown · non-contrast
Comparison: None.

CLINICAL DATA: 79-year-old with RIGHT thumb pain of unspecified
chronicity.

EXAM:
RIGHT HAND - COMPLETE 3+ VIEW

[hand ap]
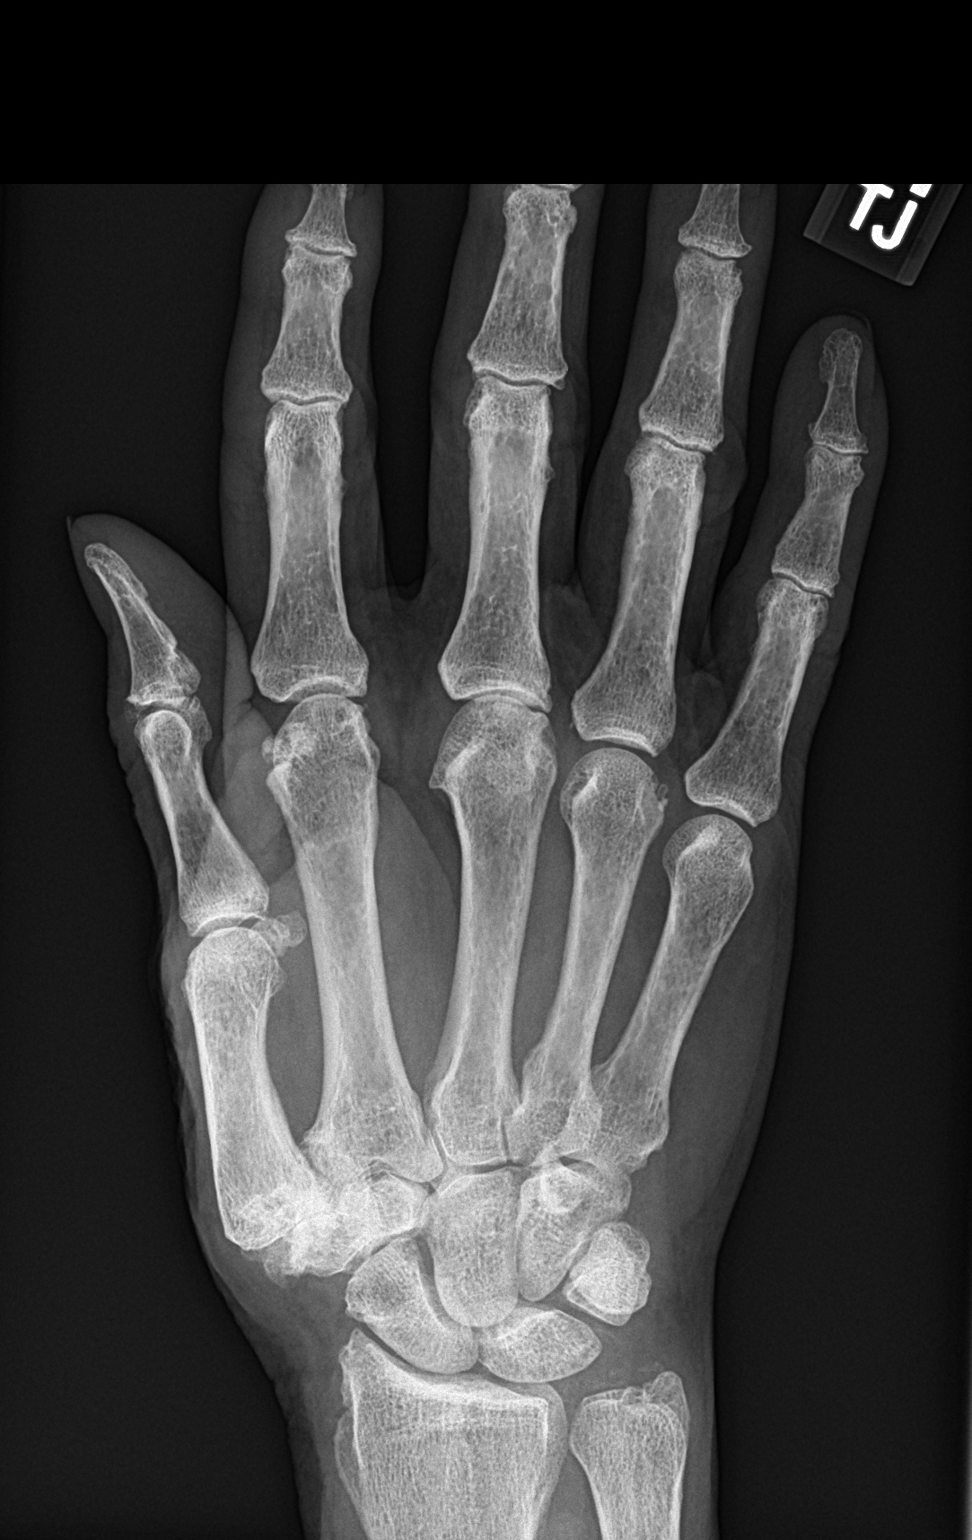

[hand obl]
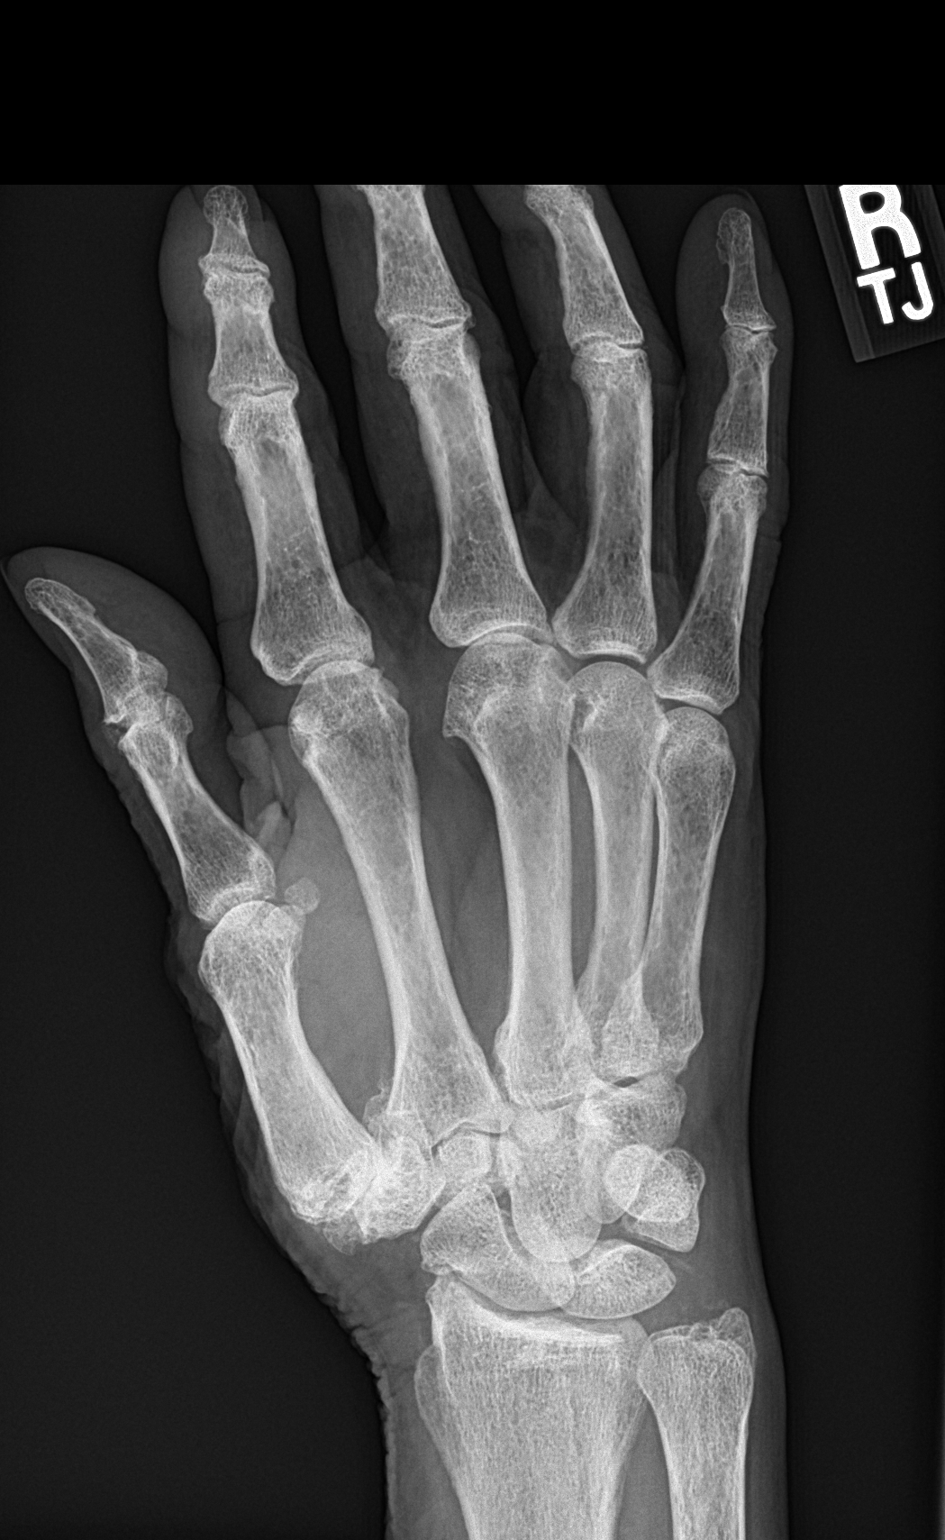

[hand lat]
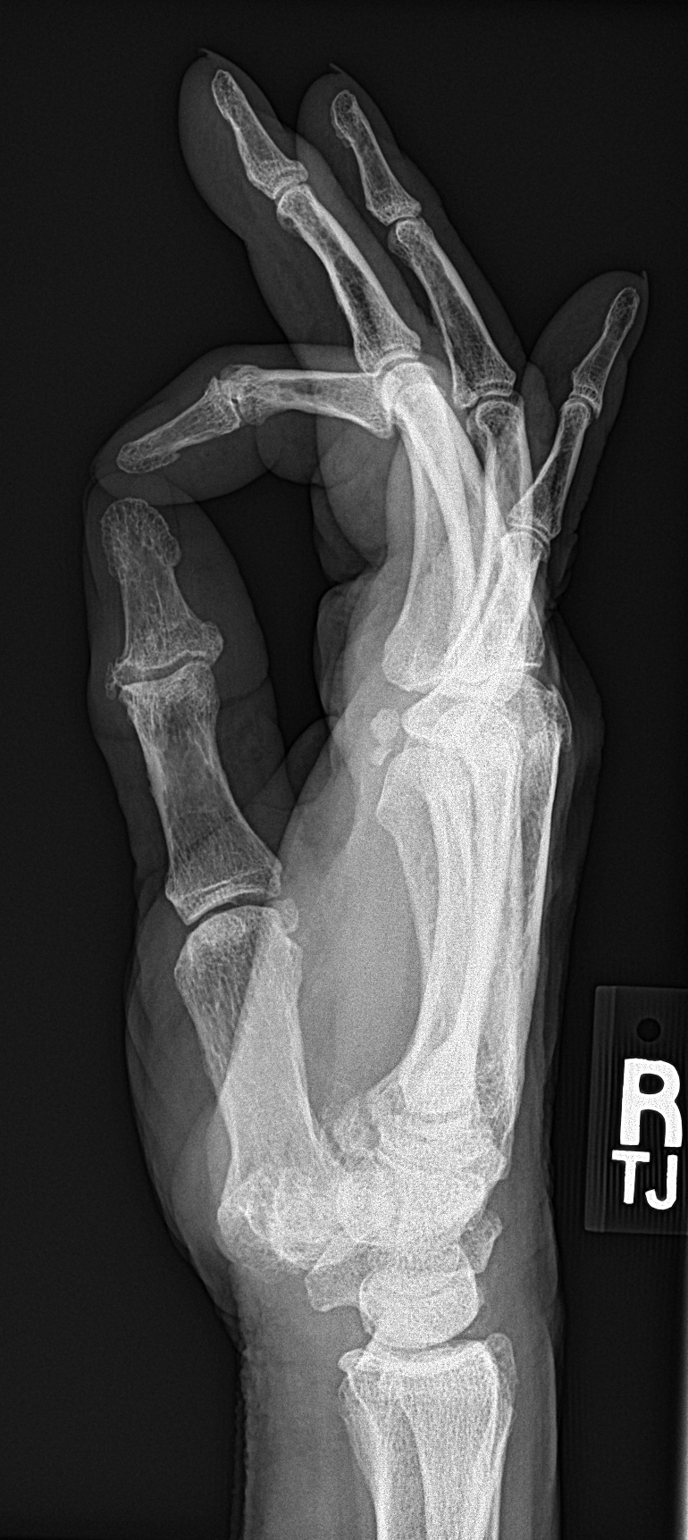

[3 of 3 positions shown; findings below may reference images not displayed]

FINDINGS: No evidence of acute, subacute or healed fractures. Mild osseous
demineralization. Severe narrowing of the trapezium-first metacarpal
joint space with associated hypertrophic spurring. Mild narrowing of
the IP joint spaces of the fingers. Moderate narrowing of the third
MCP joint space. Mild radiocarpal joint space narrowing.
Calcifications in the triangular fibrocartilage complex of the
wrist.
IMPRESSION: 1. No acute or subacute osseous abnormality.
2. Severe osteoarthritis involving the trapezium-first metacarpal
joint.
3. Mild osteoarthritis involving the IP joints of the fingers and
the radiocarpal joint.
4. CPPD involving the triangular fibrocartilage complex of the
wrist.

## 2020-05-26 DIAGNOSIS — M19011 Primary osteoarthritis, right shoulder: Secondary | ICD-10-CM | POA: Diagnosis not present

## 2020-06-23 DIAGNOSIS — M170 Bilateral primary osteoarthritis of knee: Secondary | ICD-10-CM | POA: Diagnosis not present

## 2020-07-14 ENCOUNTER — Other Ambulatory Visit: Payer: Self-pay | Admitting: Family Medicine

## 2020-08-03 DIAGNOSIS — M25511 Pain in right shoulder: Secondary | ICD-10-CM | POA: Diagnosis not present

## 2020-08-03 DIAGNOSIS — M19011 Primary osteoarthritis, right shoulder: Secondary | ICD-10-CM | POA: Diagnosis not present

## 2020-08-03 DIAGNOSIS — M75120 Complete rotator cuff tear or rupture of unspecified shoulder, not specified as traumatic: Secondary | ICD-10-CM | POA: Diagnosis not present

## 2020-08-03 DIAGNOSIS — M25512 Pain in left shoulder: Secondary | ICD-10-CM | POA: Diagnosis not present

## 2020-08-04 ENCOUNTER — Other Ambulatory Visit: Payer: Self-pay | Admitting: Family Medicine

## 2020-08-04 NOTE — Telephone Encounter (Signed)
Electronic refill request. Celebrex Last office visit:   03/26/2020 Last Filled:    180 capsule 1 01/02/2020

## 2020-08-05 ENCOUNTER — Ambulatory Visit: Payer: Medicare Other | Admitting: Dermatology

## 2020-08-05 NOTE — Telephone Encounter (Signed)
Sent.  Needs follow-up this fall when possible.  Thanks.

## 2020-08-12 ENCOUNTER — Other Ambulatory Visit: Payer: Self-pay | Admitting: Family Medicine

## 2020-08-18 DIAGNOSIS — M25511 Pain in right shoulder: Secondary | ICD-10-CM | POA: Diagnosis not present

## 2020-08-18 DIAGNOSIS — M25611 Stiffness of right shoulder, not elsewhere classified: Secondary | ICD-10-CM | POA: Diagnosis not present

## 2020-08-25 DIAGNOSIS — M25511 Pain in right shoulder: Secondary | ICD-10-CM | POA: Diagnosis not present

## 2020-08-25 DIAGNOSIS — M25611 Stiffness of right shoulder, not elsewhere classified: Secondary | ICD-10-CM | POA: Diagnosis not present

## 2020-08-27 ENCOUNTER — Ambulatory Visit (INDEPENDENT_AMBULATORY_CARE_PROVIDER_SITE_OTHER): Payer: Medicare Other

## 2020-08-27 ENCOUNTER — Other Ambulatory Visit: Payer: Self-pay

## 2020-08-27 DIAGNOSIS — Z23 Encounter for immunization: Secondary | ICD-10-CM

## 2020-08-31 ENCOUNTER — Encounter: Payer: Self-pay | Admitting: Dermatology

## 2020-08-31 ENCOUNTER — Other Ambulatory Visit: Payer: Self-pay

## 2020-08-31 ENCOUNTER — Ambulatory Visit: Payer: Medicare Other | Admitting: Dermatology

## 2020-08-31 DIAGNOSIS — D0439 Carcinoma in situ of skin of other parts of face: Secondary | ICD-10-CM

## 2020-08-31 DIAGNOSIS — Z86007 Personal history of in-situ neoplasm of skin: Secondary | ICD-10-CM

## 2020-08-31 DIAGNOSIS — C4492 Squamous cell carcinoma of skin, unspecified: Secondary | ICD-10-CM

## 2020-08-31 DIAGNOSIS — L570 Actinic keratosis: Secondary | ICD-10-CM | POA: Diagnosis not present

## 2020-08-31 HISTORY — DX: Squamous cell carcinoma of skin, unspecified: C44.92

## 2020-08-31 NOTE — Patient Instructions (Addendum)
Routine follow-up for Todd Gonzales date of birth [birth year].  He has had a sensitive spot grow on the left mandible which had previously been frozen.  He also has numerous precancerous crusts on his face.  The spot on the left mandible was shave biopsied and the base treated with curette plus cautery.  This will produce a crusting for 2 weeks with no restrictions.  I will do a follow-up on the left jawline at the same time I do a TCA fluorouracil peel on the rest of his face next month.  Biopsy, Surgery (Curettage) & Surgery (Excision) Aftercare Instructions  1. Okay to remove bandage in 24 hours  2. Wash area with soap and water  3. Apply Vaseline to area twice daily until healed (Not Neosporin)  4. Okay to cover with a Band-Aid to decrease the chance of infection or prevent irritation from clothing; also it's okay to uncover lesion at home.  5. Suture instructions: return to our office in 7-10 or 10-14 days for a nurse visit for suture removal. Variable healing with sutures, if pain or itching occurs call our office. It's okay to shower or bathe 24 hours after sutures are given.  6. The following risks may occur after a biopsy, curettage or excision: bleeding, scarring, discoloration, recurrence, infection (redness, yellow drainage, pain or swelling).  7. For questions, concerns and results call our office at [place] before 4pm & Friday before 3pm. Biopsy results will be available in 1 week.

## 2020-09-03 DIAGNOSIS — M25611 Stiffness of right shoulder, not elsewhere classified: Secondary | ICD-10-CM | POA: Diagnosis not present

## 2020-09-03 DIAGNOSIS — M25511 Pain in right shoulder: Secondary | ICD-10-CM | POA: Diagnosis not present

## 2020-09-07 ENCOUNTER — Encounter: Payer: Self-pay | Admitting: *Deleted

## 2020-09-07 ENCOUNTER — Telehealth: Payer: Self-pay | Admitting: *Deleted

## 2020-09-07 ENCOUNTER — Telehealth: Payer: Self-pay | Admitting: Dermatology

## 2020-09-07 DIAGNOSIS — M7711 Lateral epicondylitis, right elbow: Secondary | ICD-10-CM | POA: Diagnosis not present

## 2020-09-07 DIAGNOSIS — M170 Bilateral primary osteoarthritis of knee: Secondary | ICD-10-CM | POA: Diagnosis not present

## 2020-09-07 NOTE — Telephone Encounter (Signed)
-----   Message from Lavonna Monarch, MD sent at 09/04/2020  7:09 AM EDT ----- Schedule surgery with Dr. Darene Lamer

## 2020-09-07 NOTE — Telephone Encounter (Signed)
Results, ST 

## 2020-09-07 NOTE — Telephone Encounter (Signed)
Left message for patient to call us back.  

## 2020-09-07 NOTE — Telephone Encounter (Signed)
Pathology to patient, surgery scheduled  

## 2020-09-08 DIAGNOSIS — M25611 Stiffness of right shoulder, not elsewhere classified: Secondary | ICD-10-CM | POA: Diagnosis not present

## 2020-09-08 DIAGNOSIS — M25511 Pain in right shoulder: Secondary | ICD-10-CM | POA: Diagnosis not present

## 2020-09-09 ENCOUNTER — Telehealth: Payer: Self-pay

## 2020-09-09 NOTE — Telephone Encounter (Signed)
-----   Message from Lavonna Monarch, MD sent at 09/04/2020  7:09 AM EDT ----- Schedule surgery with Dr. Darene Lamer

## 2020-09-09 NOTE — Telephone Encounter (Signed)
Patient has mychart and has his follow up visit scheduled.

## 2020-09-14 DIAGNOSIS — M170 Bilateral primary osteoarthritis of knee: Secondary | ICD-10-CM | POA: Diagnosis not present

## 2020-09-21 ENCOUNTER — Encounter: Payer: Self-pay | Admitting: *Deleted

## 2020-09-21 DIAGNOSIS — M170 Bilateral primary osteoarthritis of knee: Secondary | ICD-10-CM | POA: Diagnosis not present

## 2020-09-21 DIAGNOSIS — M19011 Primary osteoarthritis, right shoulder: Secondary | ICD-10-CM | POA: Diagnosis not present

## 2020-09-22 ENCOUNTER — Other Ambulatory Visit: Payer: Self-pay

## 2020-09-22 ENCOUNTER — Encounter: Payer: Self-pay | Admitting: Dermatology

## 2020-09-22 ENCOUNTER — Ambulatory Visit (INDEPENDENT_AMBULATORY_CARE_PROVIDER_SITE_OTHER): Payer: Medicare Other | Admitting: Dermatology

## 2020-09-22 ENCOUNTER — Encounter: Payer: Medicare Other | Admitting: Dermatology

## 2020-09-22 DIAGNOSIS — Z85828 Personal history of other malignant neoplasm of skin: Secondary | ICD-10-CM | POA: Diagnosis not present

## 2020-09-22 DIAGNOSIS — Z8589 Personal history of malignant neoplasm of other organs and systems: Secondary | ICD-10-CM

## 2020-09-22 DIAGNOSIS — C4492 Squamous cell carcinoma of skin, unspecified: Secondary | ICD-10-CM

## 2020-09-22 DIAGNOSIS — C44329 Squamous cell carcinoma of skin of other parts of face: Secondary | ICD-10-CM | POA: Diagnosis not present

## 2020-09-22 DIAGNOSIS — L570 Actinic keratosis: Secondary | ICD-10-CM | POA: Diagnosis not present

## 2020-09-30 NOTE — Progress Notes (Signed)
   Follow-Up Visit   Subjective  Todd Gonzales is a 79 y.o. male who presents for the following: Skin Problem (left jaw line x months).  Skin exam Location:  Duration:  Quality:  Associated Signs/Symptoms: Modifying Factors:  Severity:  Timing: Context:   Objective  Well appearing patient in no apparent distress; mood and affect are within normal limits.  A full examination was performed including scalp, head, eyes, ears, nose, lips, neck, chest, axillae, abdomen, back, buttocks, bilateral upper extremities, bilateral lower extremities, hands, feet, fingers, toes, fingernails, and toenails. All findings within normal limits unless otherwise noted below.    Routine follow-up for Todd Gonzales date of birth January 11, 1941.  He has had a sensitive spot grow on the left mandible which had previously been frozen.  He also has numerous precancerous crusts on his face.  The spot on the left mandible was shave biopsied and the base treated with curette plus cautery.  This will produce a crusting for 2 weeks with no restrictions.  I will do a follow-up on the left jawline at the same time I do a TCA fluorouracil peel on the rest of his face next month.    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  Destruction of lesion - Left Jaw Line Cheek Complexity: simple   Destruction method: electrodesiccation and curettage   Informed consent: discussed and consent obtained   Timeout:  patient name, date of birth, surgical site, and procedure verified Anesthesia: the lesion was anesthetized in a standard fashion   Anesthetic:  1% lidocaine w/ epinephrine 1-100,000 local infiltration Curettage performed in three different directions: Yes   Curettage cycles:  3 Lesion length (cm):  1 Lesion width (cm):  1 Margin per side (cm):  0 Final wound size (cm):  1 Hemostasis achieved with:  aluminum  chloride Outcome: patient tolerated procedure well with no complications   Post-procedure details: wound care instructions given   Additional details:  Wound inoculated with 5% fluorouracil solution  Specimen 1 - Surgical pathology Differential Diagnosis: BCC SCC Check Margins: No  AK (actinic keratosis) Head - Anterior (Face)  Patient will follow up and do a Fluorouracil peel on face in the fall/winter.      I, Lavonna Monarch, MD, have reviewed all documentation for this visit.  The documentation on 10/04/20 for the exam, diagnosis, procedures, and orders are all accurate and complete.

## 2020-10-04 ENCOUNTER — Encounter: Payer: Self-pay | Admitting: Dermatology

## 2020-10-12 ENCOUNTER — Other Ambulatory Visit: Payer: Self-pay | Admitting: Family Medicine

## 2020-10-12 NOTE — Telephone Encounter (Signed)
Pharmacy requests refill on: Tamsulosin HCl 0.4 mg  LAST REFILL: 07/14/2020 LAST OV: 03/26/2020 NEXT OV: Not Scheduled  PHARMACY: [facility]

## 2020-10-15 ENCOUNTER — Encounter: Payer: Self-pay | Admitting: Dermatology

## 2020-10-15 ENCOUNTER — Ambulatory Visit: Payer: Medicare Other | Admitting: Dermatology

## 2020-10-15 ENCOUNTER — Other Ambulatory Visit: Payer: Self-pay

## 2020-10-15 DIAGNOSIS — Z85828 Personal history of other malignant neoplasm of skin: Secondary | ICD-10-CM | POA: Diagnosis not present

## 2020-10-15 DIAGNOSIS — L570 Actinic keratosis: Secondary | ICD-10-CM | POA: Diagnosis not present

## 2020-10-15 MED ORDER — FLUOROURACIL 5 % EX CREA: TOPICAL_CREAM | Freq: Two times a day (BID) | CUTANEOUS | 0 refills | Status: DC

## 2020-10-21 NOTE — Addendum Note (Signed)
Addended by: Lavonna Monarch on: 10/21/2020 05:30 AM   Modules accepted: Level of Service

## 2020-10-28 ENCOUNTER — Encounter: Payer: Self-pay | Admitting: Dermatology

## 2020-10-28 NOTE — Progress Notes (Signed)
   Follow-Up Visit   Subjective  Todd Gonzales is a 79 y.o. male who presents for the following: Procedure (TCA).  Diffuse actinic keratoses Location: Face Duration:  Quality:  Associated Signs/Symptoms: Modifying Factors: Has not cleared with previous fluorouracil or PDT. Severity:  Timing: Context: History of overdosing nonmelanoma skin cancers.  Objective  Well appearing patient in no apparent distress; mood and affect are within normal limits.  A focused examination was performed including Head and neck.. Relevant physical exam findings are noted in the Assessment and Plan.   Assessment & Plan    AK (actinic keratosis) Mid Forehead  Thicker spots treated with 40% TCA, 35% TCA used on upper two thirds of face sparing eyelids.  Neutralized after roughly 1 minute with alcohol wipe and then cold water compresses.  Facial chemical peel - Mid Forehead Location: face, forehead, and cheeks  Chemical used: TCA 40%  Informed consent: Discussed risks (infection, pain, swelling, peeling, blistering, allergic reaction, redness, textural changes of skin, crusting, ulceration, permanent discoloration of skin, undesirable cosmetic result, need for additional treatment, herpes outbreak, and scarring) and benefits of the procedure, as well as the alternatives. Informed consent was obtained.  HSV Prophylaxis: Not indicated  Preparation: The area was cleansed prior to the application of the chemical.  Procedure Details: It was left on until appropriate frost appeared.  Plan:  After the procedure an application of petrolatum was done to the treated area. Patient was instructed on how to appropriately cleanse and moisturize the treated area.  Patient was instructed to apply sunscreen daily and use Tylenol (acetaminophen) for any pain. Patient will call for any problems including crusting, ulceration, fever, pus, or significant discomfort.  Return to clinic for additional treatment as  needed.   fluorouracil (EFUDEX) 5 % cream - Mid Forehead     I, Lavonna Monarch, MD, have reviewed all documentation for this visit.  The documentation on 10/28/20 for the exam, diagnosis, procedures, and orders are all accurate and complete.

## 2020-10-31 ENCOUNTER — Encounter: Payer: Self-pay | Admitting: Dermatology

## 2020-10-31 NOTE — Progress Notes (Signed)
   Follow-Up Visit   Subjective  Todd Gonzales is a 79 y.o. male who presents for the following: Follow-up (LEFTJAWLINECHEEK).  SCCA Location: Face Duration:  Quality:  Associated Signs/Symptoms: Modifying Factors:  Severity:  Timing: Context:   Objective  Well appearing patient in no apparent distress; mood and affect are within normal limits.  A focused examination was performed including Head and neck.. Relevant physical exam findings are noted in the Assessment and Plan.   Assessment & Plan    History of squamous cell carcinoma Left Anterior Mandible  Squamous cell carcinoma of skin Left Jawline Cheek  No surgery per ST- spot clear- call if spots reoccurs- will plan to do Cream or TCA peel in the future  AK (actinic keratosis) Mid Forehead  Other Related Medications fluorouracil (EFUDEX) 5 % cream      I, Lavonna Monarch, MD, have reviewed all documentation for this visit.  The documentation on 10/31/20 for the exam, diagnosis, procedures, and orders are all accurate and complete.

## 2020-12-17 ENCOUNTER — Other Ambulatory Visit: Payer: Self-pay | Admitting: Family Medicine

## 2020-12-23 ENCOUNTER — Ambulatory Visit: Payer: Medicare Other | Admitting: Physician Assistant

## 2020-12-23 ENCOUNTER — Other Ambulatory Visit: Payer: Self-pay

## 2020-12-23 ENCOUNTER — Encounter: Payer: Self-pay | Admitting: Physician Assistant

## 2020-12-23 DIAGNOSIS — L570 Actinic keratosis: Secondary | ICD-10-CM | POA: Diagnosis not present

## 2020-12-23 DIAGNOSIS — D0439 Carcinoma in situ of skin of other parts of face: Secondary | ICD-10-CM | POA: Diagnosis not present

## 2020-12-23 DIAGNOSIS — C44329 Squamous cell carcinoma of skin of other parts of face: Secondary | ICD-10-CM

## 2020-12-23 DIAGNOSIS — C44320 Squamous cell carcinoma of skin of unspecified parts of face: Secondary | ICD-10-CM

## 2020-12-23 DIAGNOSIS — D485 Neoplasm of uncertain behavior of skin: Secondary | ICD-10-CM

## 2020-12-23 MED ORDER — FLUOROURACIL 5 % EX CREA: TOPICAL_CREAM | Freq: Two times a day (BID) | CUTANEOUS | 2 refills | Status: DC

## 2020-12-23 NOTE — Patient Instructions (Signed)

## 2020-12-23 NOTE — Progress Notes (Deleted)
Follow-Up Visit   Subjective  Todd Gonzales is a 80 y.o. male who presents for the following: Skin Problem (Patient here today for spot on left forehead x 6 months. Per patient only bleeding if scratched, crusty, sensitive.).   The following portions of the chart were reviewed this encounter and updated as appropriate:  Tobacco  Allergies  Meds  Problems  Med Hx  Surg Hx  Fam Hx      Objective  Well appearing patient in no apparent distress; mood and affect are within normal limits.  A focused examination was performed including face and hands. Relevant physical exam findings are noted in the Assessment and Plan.  Objective  Left Buccal Cheek, Left Forehead, Left Temple, Left Zygomatic Area, Mid Forehead (5), Right Buccal Cheek, Right Preauricular Area, Right Zygomatic Area: Erythematous patches with gritty scale.  Objective  Left Forehead: Hyperkeratotic scale with pink base   Left Submandibular Area  Objective  Left Submandibular Area: Hyperkeratotic scale with pink base   Assessment & Plan  AK (actinic keratosis) (12) Mid Forehead (5); Left Temple; Right Preauricular Area; Left Zygomatic Area; Right Zygomatic Area; Left Buccal Cheek; Right Buccal Cheek; Left Forehead  Destruction of lesion - Left Buccal Cheek, Left Forehead, Left Temple, Left Zygomatic Area, Mid Forehead, Right Buccal Cheek, Right Preauricular Area, Right Zygomatic Area Complexity: simple   Destruction method: cryotherapy   Informed consent: discussed and consent obtained   Timeout:  patient name, date of birth, surgical site, and procedure verified Lesion destroyed using liquid nitrogen: Yes   Cryotherapy cycles:  3 Outcome: patient tolerated procedure well with no complications    fluorouracil (EFUDEX) 5 % cream - Mid Forehead  SCC (squamous cell carcinoma), face Left Forehead  Skin / nail biopsy Type of biopsy: tangential   Informed consent: discussed and consent obtained   Timeout:  patient name, date of birth, surgical site, and procedure verified   Procedure prep:  Patient was prepped and draped in usual sterile fashion (Non sterile) Prep type:  Chlorhexidine Anesthesia: the lesion was anesthetized in a standard fashion   Anesthetic:  1% lidocaine w/ epinephrine 1-100,000 local infiltration Instrument used: flexible razor blade   Outcome: patient tolerated procedure well   Post-procedure details: wound care instructions given    Destruction of lesion Complexity: simple   Destruction method: electrodesiccation and curettage   Informed consent: discussed and consent obtained   Timeout:  patient name, date of birth, surgical site, and procedure verified Anesthesia: the lesion was anesthetized in a standard fashion   Anesthetic:  1% lidocaine w/ epinephrine 1-100,000 local infiltration Curettage performed in three different directions: Yes   Electrodesiccation performed over the curetted area: Yes   Curettage cycles:  3 Margin per side (cm):  0.1 Final wound size (cm):  1.4 Hemostasis achieved with:  aluminum chloride Outcome: patient tolerated procedure well with no complications   Post-procedure details: wound care instructions given    Specimen 1 - Surgical pathology Differential Diagnosis:bcc vs scc  Check Margins: No  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  Skin / nail biopsy - Left Submandibular Area Type of biopsy: tangential   Informed consent: discussed and consent obtained   Timeout: patient name, date of birth, surgical site, and procedure verified   Procedure prep:  Patient was prepped and draped in usual sterile fashion (Non sterile) Prep type:  Chlorhexidine Anesthesia: the lesion was anesthetized in a standard fashion   Anesthetic:  1%  lidocaine w/ epinephrine 1-100,000 local infiltration Instrument used: flexible razor blade   Outcome: patient tolerated procedure well   Post-procedure details: wound care  instructions given    Destruction of lesion - Left Submandibular Area Complexity: simple   Destruction method: electrodesiccation and curettage   Informed consent: discussed and consent obtained   Timeout:  patient name, date of birth, surgical site, and procedure verified Anesthesia: the lesion was anesthetized in a standard fashion   Anesthetic:  1% lidocaine w/ epinephrine 1-100,000 local infiltration Curettage performed in three different directions: Yes   Electrodesiccation performed over the curetted area: Yes   Curettage cycles:  3 Margin per side (cm):  0.1 Final wound size (cm):  1.2 Hemostasis achieved with:  suture and aluminum chloride Outcome: patient tolerated procedure well with no complications   Post-procedure details: wound care instructions given    Specimen 2 - Surgical pathology Differential Diagnosis: bcc vs scc  Check Margins: No   I, Hala Narula, PA-C, have reviewed all documentation's for this visit.  The documentation on 01/05/21 for the exam, diagnosis, procedures and orders are all accurate and complete.

## 2020-12-28 DIAGNOSIS — M170 Bilateral primary osteoarthritis of knee: Secondary | ICD-10-CM | POA: Diagnosis not present

## 2020-12-29 ENCOUNTER — Telehealth: Payer: Self-pay | Admitting: Neurology

## 2020-12-29 ENCOUNTER — Telehealth: Payer: Self-pay | Admitting: Physician Assistant

## 2020-12-29 NOTE — Telephone Encounter (Signed)
Pt called, would like to discuss inspire implants for sleep apnea. Would like a call from the nurse.

## 2020-12-29 NOTE — Telephone Encounter (Signed)
I called the pt to discuss inspire.  He sts he has seen commercials about this particular therapy and wanted to know if this was something he could qualify for?  I advised inspire requires a documented trial/failure of CPAP and we did not have down where he tried cpap. Pt sts he did not try CPAP and is not interested in trying at this time.  Pt was advised if he was to change his mind to call us back and we could reassess his OSA.

## 2020-12-29 NOTE — Telephone Encounter (Signed)
Patient is calling for pathology results from last visit with Kelli Sheffield, PA-C. 

## 2020-12-29 NOTE — Telephone Encounter (Signed)
Phone call to patient to let him know pathology results aren't back yet. Told patient to call back later this week.

## 2021-01-05 ENCOUNTER — Encounter: Payer: Self-pay | Admitting: Physician Assistant

## 2021-01-06 ENCOUNTER — Telehealth: Payer: Self-pay

## 2021-01-06 NOTE — Telephone Encounter (Signed)
-----   Message from Warren Danes, [place] sent at 01/05/2021  5:12 PM EST ----- Tx with bx. Recheck 3 mo. RTC if recurs

## 2021-01-06 NOTE — Telephone Encounter (Signed)
Phone call to patient with his pathology results. Voicemail left for patient to give the office a call back.  ?

## 2021-01-11 NOTE — Telephone Encounter (Signed)
Path to patient advised to recheck in 3 months per [facility]

## 2021-01-11 NOTE — Telephone Encounter (Signed)
-----   Message from Kelli R Sheffield, PA-C sent at 01/05/2021  5:12 PM EST ----- Tx with bx. Recheck 3 mo. RTC if recurs 

## 2021-01-11 NOTE — Telephone Encounter (Signed)
Patient left message on office voice mail that he was returning phone call from last week about pathology results.  Patient states that he was out of town last week.

## 2021-01-27 ENCOUNTER — Other Ambulatory Visit: Payer: Self-pay

## 2021-01-27 ENCOUNTER — Ambulatory Visit: Payer: Medicare Other | Admitting: Dermatology

## 2021-01-27 ENCOUNTER — Encounter: Payer: Self-pay | Admitting: Dermatology

## 2021-01-27 DIAGNOSIS — D0439 Carcinoma in situ of skin of other parts of face: Secondary | ICD-10-CM

## 2021-01-27 DIAGNOSIS — L570 Actinic keratosis: Secondary | ICD-10-CM | POA: Diagnosis not present

## 2021-01-27 DIAGNOSIS — D485 Neoplasm of uncertain behavior of skin: Secondary | ICD-10-CM

## 2021-01-27 NOTE — Patient Instructions (Signed)

## 2021-01-28 NOTE — Progress Notes (Addendum)
Follow-Up Visit   Subjective  Todd Gonzales is a 80 y.o. male who presents for the following: Skin Problem (Patient here today for spot on left forehead x 6 months. Per patient only bleeding if scratched, crusty, sensitive.).   The following portions of the chart were reviewed this encounter and updated as appropriate:  Tobacco  Allergies  Meds  Problems  Med Hx  Surg Hx  Fam Hx      Objective  Well appearing patient in no apparent distress; mood and affect are within normal limits.  All skin waist up examined.  Objective  Left Buccal Cheek, Left Forehead, Left Temple, Left Zygomatic Area, Mid Forehead (5), Right Buccal Cheek, Right Preauricular Area, Right Zygomatic Area: Erythematous patches with gritty scale.  Objective  Left Forehead: Hyperkeratotic scale with pink base   Left Submandibular Area  Objective  Left Submandibular Area: Hyperkeratotic scale with pink base   Assessment & Plan  AK (actinic keratosis) (12) Mid Forehead (5); Left Temple; Right Preauricular Area; Left Zygomatic Area; Right Zygomatic Area; Left Buccal Cheek; Right Buccal Cheek; Left Forehead  Destruction of lesion - Left Buccal Cheek, Left Forehead, Left Temple, Left Zygomatic Area, Mid Forehead, Right Buccal Cheek, Right Preauricular Area, Right Zygomatic Area Complexity: simple   Destruction method: cryotherapy   Informed consent: discussed and consent obtained   Timeout:  patient name, date of birth, surgical site, and procedure verified Lesion destroyed using liquid nitrogen: Yes   Cryotherapy cycles:  3 Outcome: patient tolerated procedure well with no complications    fluorouracil (EFUDEX) 5 % cream - Mid Forehead  SCC (squamous cell carcinoma), face Left Forehead  Skin / nail biopsy Type of biopsy: tangential   Informed consent: discussed and consent obtained   Timeout: patient name, date of birth, surgical site, and procedure verified   Procedure prep:  Patient was prepped  and draped in usual sterile fashion (Non sterile) Prep type:  Chlorhexidine Anesthesia: the lesion was anesthetized in a standard fashion   Anesthetic:  1% lidocaine w/ epinephrine 1-100,000 local infiltration Instrument used: flexible razor blade   Outcome: patient tolerated procedure well   Post-procedure details: wound care instructions given    Destruction of lesion Complexity: simple   Destruction method: electrodesiccation and curettage   Informed consent: discussed and consent obtained   Timeout:  patient name, date of birth, surgical site, and procedure verified Anesthesia: the lesion was anesthetized in a standard fashion   Anesthetic:  1% lidocaine w/ epinephrine 1-100,000 local infiltration Curettage performed in three different directions: Yes   Electrodesiccation performed over the curetted area: Yes   Curettage cycles:  3 Margin per side (cm):  0.1 Final wound size (cm):  1.4 Hemostasis achieved with:  aluminum chloride Outcome: patient tolerated procedure well with no complications   Post-procedure details: wound care instructions given    Specimen 1 - Surgical pathology Differential Diagnosis:bcc vs scc  Check Margins: No  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  Skin / nail biopsy - Left Submandibular Area Type of biopsy: tangential   Informed consent: discussed and consent obtained   Timeout: patient name, date of birth, surgical site, and procedure verified   Procedure prep:  Patient was prepped and draped in usual sterile fashion (Non sterile) Prep type:  Chlorhexidine Anesthesia: the lesion was anesthetized in a standard fashion   Anesthetic:  1% lidocaine w/ epinephrine 1-100,000 local infiltration Instrument used: flexible razor blade   Outcome: patient  tolerated procedure well   Post-procedure details: wound care instructions given    Destruction of lesion - Left Submandibular Area Complexity: simple   Destruction  method: electrodesiccation and curettage   Informed consent: discussed and consent obtained   Timeout:  patient name, date of birth, surgical site, and procedure verified Anesthesia: the lesion was anesthetized in a standard fashion   Anesthetic:  1% lidocaine w/ epinephrine 1-100,000 local infiltration Curettage performed in three different directions: Yes   Electrodesiccation performed over the curetted area: Yes   Curettage cycles:  3 Margin per side (cm):  0.1 Final wound size (cm):  1.2 Hemostasis achieved with:  suture and aluminum chloride Outcome: patient tolerated procedure well with no complications   Post-procedure details: wound care instructions given    Specimen 2 - Surgical pathology Differential Diagnosis: bcc vs scc  Check Margins: No   I, Janan Bogie, PA-C, have reviewed all documentation's for this visit.  The documentation on 02/10/21 for the exam, diagnosis, procedures and orders are all accurate and complete.

## 2021-01-30 ENCOUNTER — Encounter: Payer: Self-pay | Admitting: Family Medicine

## 2021-02-01 ENCOUNTER — Telehealth: Payer: Self-pay

## 2021-02-01 NOTE — Telephone Encounter (Signed)
Left message to call office

## 2021-02-01 NOTE — Telephone Encounter (Signed)
-----   Message from Lavonna Monarch, MD sent at 01/30/2021  7:25 AM EST ----- Schedule surgery with Dr. Darene Lamer

## 2021-02-02 ENCOUNTER — Telehealth: Payer: Self-pay | Admitting: Dermatology

## 2021-02-02 NOTE — Telephone Encounter (Signed)
Patient is calling for pathology results from last visit with Stuart Tafeen, MD 

## 2021-02-02 NOTE — Telephone Encounter (Signed)
Path to patient surgery made for april

## 2021-02-08 ENCOUNTER — Encounter: Payer: Self-pay | Admitting: Dermatology

## 2021-02-08 NOTE — Progress Notes (Signed)
   Follow-Up Visit   Subjective  Todd Gonzales is a 80 y.o. male who presents for the following: Follow-up (Follow up on acid peel.).  Follow-up for actinic keratoses plus new sore spot on left cheek Location:  Duration:  Quality:  Associated Signs/Symptoms: Modifying Factors:  Severity:  Timing: Context:   Objective  Well appearing patient in no apparent distress; mood and affect are within normal limits. Objective  Left Malar Cheek: Ill-defined 1.5cm crust with central erosion     Objective  Head - Anterior (Face): Patient and I are both pleased with 70% reduction in crusty spots on face.  Original plan for this visit was to decide on a final TCA-5-FU peel or treat any residual actinic keratoses, but the growth of the lesion on the left cheekbone will have already   A focused examination was performed including Head and neck.. Relevant physical exam findings are noted in the Assessment and Plan.   Assessment & Plan    Neoplasm of uncertain behavior of skin Left Malar Cheek  Skin / nail biopsy Type of biopsy: tangential   Informed consent: discussed and consent obtained   Timeout: patient name, date of birth, surgical site, and procedure verified   Procedure prep:  Patient was prepped and draped in usual sterile fashion (Non sterile) Prep type:  Chlorhexidine Anesthesia: the lesion was anesthetized in a standard fashion   Anesthetic:  1% lidocaine w/ epinephrine 1-100,000 local infiltration Instrument used: flexible razor blade   Outcome: patient tolerated procedure well   Post-procedure details: wound care instructions given    Specimen 1 - Surgical pathology Differential Diagnosis: bcc vs scc  Check Margins: No  AK (actinic keratosis) Head - Anterior (Face)  Defer further AK treatment until the late fall and winter.  Other Related Medications fluorouracil (EFUDEX) 5 % cream     I, Lavonna Monarch, MD, have reviewed all documentation for this visit.   The documentation on 02/08/21 for the exam, diagnosis, procedures, and orders are all accurate and complete.

## 2021-02-10 NOTE — Addendum Note (Signed)
Addended by: Robyne Askew R on: 02/10/2021 12:09 PM   Modules accepted: Level of Service

## 2021-03-06 ENCOUNTER — Other Ambulatory Visit: Payer: Self-pay | Admitting: Family Medicine

## 2021-03-08 ENCOUNTER — Other Ambulatory Visit: Payer: Self-pay | Admitting: Family Medicine

## 2021-03-08 NOTE — Telephone Encounter (Signed)
Chris pharmacist at [facility] left v/m requesting status of celebrex 200 mg cap; pt was at pharmacy and Gerald Stabs will give pt 30 day supply until hears from office.

## 2021-03-24 ENCOUNTER — Other Ambulatory Visit: Payer: Self-pay

## 2021-03-24 ENCOUNTER — Ambulatory Visit (INDEPENDENT_AMBULATORY_CARE_PROVIDER_SITE_OTHER): Payer: Medicare Other | Admitting: Dermatology

## 2021-03-24 ENCOUNTER — Encounter: Payer: Self-pay | Admitting: Dermatology

## 2021-03-24 DIAGNOSIS — D0439 Carcinoma in situ of skin of other parts of face: Secondary | ICD-10-CM

## 2021-03-24 DIAGNOSIS — L570 Actinic keratosis: Secondary | ICD-10-CM

## 2021-03-24 DIAGNOSIS — D099 Carcinoma in situ, unspecified: Secondary | ICD-10-CM

## 2021-03-24 NOTE — Patient Instructions (Signed)

## 2021-04-01 ENCOUNTER — Encounter: Payer: Medicare Other | Admitting: Dermatology

## 2021-04-04 ENCOUNTER — Encounter: Payer: Self-pay | Admitting: Dermatology

## 2021-04-04 NOTE — Progress Notes (Signed)
   Follow-Up Visit   Subjective  Todd Gonzales is a 80 y.o. male who presents for the following: No chief complaint on file..  Skin cancer Location: Left cheek Duration:  Quality:  Associated Signs/Symptoms: Modifying Factors:  Severity:  Timing: Context: Also crusts on ear  Objective  Well appearing patient in no apparent distress; mood and affect are within normal limits. Objective  Left Malar Cheek: Lesion identified by Dr.Nakeyia Menden and nurse in room.    Objective  Left Superior Crus of Antihelix (2): Hornlike 3 mm pink crusts    A focused examination was performed including Head and neck. Relevant physical exam findings are noted in the Assessment and Plan.   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  Destruction of lesion Complexity: simple   Destruction method: electrodesiccation and curettage   Informed consent: discussed and consent obtained   Timeout:  patient name, date of birth, surgical site, and procedure verified Anesthesia: the lesion was anesthetized in a standard fashion   Anesthetic:  1% lidocaine w/ epinephrine 1-100,000 nerve block Curettage performed in three different directions: Yes   Curettage cycles:  3 Lesion length (cm):  3 Lesion width (cm):  3 Margin per side (cm):  0 Final wound size (cm):  3 Hemostasis achieved with:  ferric subsulfate Outcome: patient tolerated procedure well with no complications   Additional details:  Wound innoculated with 5 fluorouracil solution.  AK (actinic keratosis) (2) Left Superior Crus of Antihelix  Destruction of lesion - Left Superior Crus of Antihelix Complexity: simple   Destruction method: cryotherapy   Informed consent: discussed and consent obtained   Timeout:  patient name, date of birth, surgical site, and procedure verified Lesion destroyed using liquid nitrogen: Yes   Cryotherapy cycles:  4 Outcome: patient tolerated procedure well with no complications    Post-procedure details: wound care instructions given    Other Related Medications fluorouracil (EFUDEX) 5 % cream      I, Lavonna Monarch, MD, have reviewed all documentation for this visit.  The documentation on 04/04/21 for the exam, diagnosis, procedures, and orders are all accurate and complete.

## 2021-04-15 DIAGNOSIS — M170 Bilateral primary osteoarthritis of knee: Secondary | ICD-10-CM | POA: Diagnosis not present

## 2021-04-22 DIAGNOSIS — M170 Bilateral primary osteoarthritis of knee: Secondary | ICD-10-CM | POA: Diagnosis not present

## 2021-04-27 ENCOUNTER — Other Ambulatory Visit: Payer: Self-pay

## 2021-04-27 ENCOUNTER — Encounter: Payer: Self-pay | Admitting: Physician Assistant

## 2021-04-27 ENCOUNTER — Ambulatory Visit: Payer: Medicare Other | Admitting: Physician Assistant

## 2021-04-27 DIAGNOSIS — C44320 Squamous cell carcinoma of skin of unspecified parts of face: Secondary | ICD-10-CM

## 2021-04-27 DIAGNOSIS — D485 Neoplasm of uncertain behavior of skin: Secondary | ICD-10-CM

## 2021-04-27 DIAGNOSIS — C44329 Squamous cell carcinoma of skin of other parts of face: Secondary | ICD-10-CM

## 2021-04-27 DIAGNOSIS — C44321 Squamous cell carcinoma of skin of nose: Secondary | ICD-10-CM | POA: Diagnosis not present

## 2021-04-27 DIAGNOSIS — Z872 Personal history of diseases of the skin and subcutaneous tissue: Secondary | ICD-10-CM | POA: Diagnosis not present

## 2021-04-27 HISTORY — DX: Squamous cell carcinoma of skin of unspecified parts of face: C44.320

## 2021-04-27 NOTE — Patient Instructions (Signed)

## 2021-04-27 NOTE — Progress Notes (Addendum)
Follow-Up Visit   Subjective  Todd Gonzales is a 80 y.o. male who presents for the following: Follow-up (Patient is here to follow up on the AKs that were frozen last visit on his face and scalp. Also we treated two Squamous cell in situs on the left forehead and left submandibular. Patient said they healed ok. No other concerns. ).   The following portions of the chart were reviewed this encounter and updated as appropriate:     Tobacco  Allergies  Meds  Problems  Med Hx  Surg Hx  Fam Hx     Objective  Well appearing patient in no apparent distress; mood and affect are within normal limits.  A focused examination was performed including face. Relevant physical exam findings are noted in the Assessment and Plan.  Left Temporal Scalp Hyperkeratotic scale with pink base      Left Nasal Sidewall     Assessment & Plan  SCC (squamous cell carcinoma), face (2) Left Temporal Scalp  Skin / nail biopsy Type of biopsy: tangential   Informed consent: discussed and consent obtained   Timeout: patient name, date of birth, surgical site, and procedure verified   Anesthesia: the lesion was anesthetized in a standard fashion   Anesthetic:  1% lidocaine w/ epinephrine 1-100,000 local infiltration Instrument used: flexible razor blade   Hemostasis achieved with: ferric subsulfate   Outcome: patient tolerated procedure well   Post-procedure details: wound care instructions given    Destruction of lesion Complexity: simple   Destruction method: electrodesiccation and curettage   Informed consent: discussed and consent obtained   Timeout:  patient name, date of birth, surgical site, and procedure verified Anesthesia: the lesion was anesthetized in a standard fashion   Anesthetic:  1% lidocaine w/ epinephrine 1-100,000 local infiltration Curettage performed in three different directions: Yes   Electrodesiccation performed over the curetted area: Yes   Curettage cycles:  3 Margin  per side (cm):  0.1 Final wound size (cm):  1.5 Hemostasis achieved with:  aluminum chloride Outcome: patient tolerated procedure well with no complications   Post-procedure details: wound care instructions given    Specimen 1 - Surgical pathology Differential Diagnosis: scc vs bcc  Check Margins: No  Left Nasal Sidewall  Skin / nail biopsy Type of biopsy: tangential   Informed consent: discussed and consent obtained   Timeout: patient name, date of birth, surgical site, and procedure verified   Anesthesia: the lesion was anesthetized in a standard fashion   Anesthetic:  1% lidocaine w/ epinephrine 1-100,000 local infiltration Instrument used: flexible razor blade   Hemostasis achieved with: ferric subsulfate   Outcome: patient tolerated procedure well   Post-procedure details: wound care instructions given    Destruction of lesion Complexity: simple   Destruction method: electrodesiccation and curettage   Informed consent: discussed and consent obtained   Timeout:  patient name, date of birth, surgical site, and procedure verified Anesthesia: the lesion was anesthetized in a standard fashion   Anesthetic:  1% lidocaine w/ epinephrine 1-100,000 local infiltration Curettage performed in three different directions: Yes   Electrodesiccation performed over the curetted area: Yes   Curettage cycles:  3 Margin per side (cm):  0.1 Final wound size (cm):  0.5 Hemostasis achieved with:  aluminum chloride Outcome: patient tolerated procedure well with no complications   Post-procedure details: wound care instructions given    Specimen 2 - Surgical pathology Differential Diagnosis: scc vs bcc  Check Margins: No Surgical sites were clear.  I, Keeghan Bialy, PA-C, have reviewed all documentation's for this visit.  The documentation on 06/01/21 for the exam, diagnosis, procedures and orders are all accurate and complete.

## 2021-04-30 DIAGNOSIS — M170 Bilateral primary osteoarthritis of knee: Secondary | ICD-10-CM | POA: Diagnosis not present

## 2021-05-05 ENCOUNTER — Telehealth: Payer: Self-pay | Admitting: Physician Assistant

## 2021-05-05 ENCOUNTER — Encounter: Payer: Self-pay | Admitting: *Deleted

## 2021-05-05 ENCOUNTER — Telehealth: Payer: Self-pay | Admitting: *Deleted

## 2021-05-05 NOTE — Telephone Encounter (Signed)
-----   Message from Warren Danes, [place] sent at 05/04/2021  5:27 PM EDT ----- Mohs x 2

## 2021-05-05 NOTE — Telephone Encounter (Signed)
Patient is calling for pathology results from last visit with Kelli Sheffield, PA-C. 

## 2021-05-05 NOTE — Telephone Encounter (Signed)
Pathology to patient wife Izora Gala. Referral sent to skin surgery center for MOHS.

## 2021-05-14 ENCOUNTER — Other Ambulatory Visit: Payer: Self-pay | Admitting: Family Medicine

## 2021-05-18 NOTE — Telephone Encounter (Signed)
Patient called back about this refill request. Patient states he has chronic prostatitis and keeps Cipro on hand. Patient states he just finished his last dose and doing better now but is getting ready to go on a cruise and would like to have this on hand. He gets prostatitis sometimes with traveling or been sick.  Please review. CB: [phone]  Cipro was denied when sent in to [place] by the pharmacy on 05/14/21

## 2021-05-19 MED ORDER — CIPROFLOXACIN HCL 500 MG PO TABS: 500.0000 mg | ORAL_TABLET | Freq: Two times a day (BID) | ORAL | 0 refills | Status: DC

## 2021-05-19 NOTE — Telephone Encounter (Signed)
Sent. Thanks.  If any return of sx, then please let us know early in the course.   Needs yearly f/u scheduled when possible.

## 2021-06-03 DIAGNOSIS — C44329 Squamous cell carcinoma of skin of other parts of face: Secondary | ICD-10-CM | POA: Diagnosis not present

## 2021-06-10 ENCOUNTER — Other Ambulatory Visit: Payer: Self-pay | Admitting: Family Medicine

## 2021-06-10 DIAGNOSIS — E559 Vitamin D deficiency, unspecified: Secondary | ICD-10-CM

## 2021-06-10 DIAGNOSIS — I10 Essential (primary) hypertension: Secondary | ICD-10-CM

## 2021-06-11 NOTE — Telephone Encounter (Signed)
Refill request for Tamsulosin 0.4 mg cap and Gabapentin 300 mg caps  LOV - 03/26/20 Next OV - not scheduled Last refill - 12/17/20 #90/1

## 2021-06-13 NOTE — Telephone Encounter (Signed)
Needs yearly visit with labs ahead of time.  Orders are in.  Thanks.

## 2021-06-15 DIAGNOSIS — M170 Bilateral primary osteoarthritis of knee: Secondary | ICD-10-CM | POA: Diagnosis not present

## 2021-06-15 NOTE — Telephone Encounter (Signed)
Routing to support pool to get appts scheduled

## 2021-06-15 NOTE — Telephone Encounter (Signed)
Left voice message to call the office  

## 2021-06-28 ENCOUNTER — Telehealth: Payer: Self-pay

## 2021-06-28 DIAGNOSIS — U071 COVID-19: Secondary | ICD-10-CM | POA: Diagnosis not present

## 2021-06-28 NOTE — Telephone Encounter (Signed)
Pt's wife LVM pt's PCP know pt is covid positive, as well as herself.  LVM on home and cell number

## 2021-06-28 NOTE — Telephone Encounter (Signed)
Patient scheduled for tomorrow at 9:30 am with [name]

## 2021-06-28 NOTE — Telephone Encounter (Signed)
Pt's wife returned call and reported pt has somewhat productive cough w/clear phlegm and nasal congestion. Pt denies any other symptoms. Advised a msg would be sent to PCP and this office would f/u. Advised if any symptoms worsened or he developed any new symptoms to contact office. Advised of ER precautions.  Pt verbalized understanding.

## 2021-06-28 NOTE — Telephone Encounter (Signed)
Please offer VV for 9:30 tomorrow AM.  Thanks.

## 2021-06-29 ENCOUNTER — Telehealth (INDEPENDENT_AMBULATORY_CARE_PROVIDER_SITE_OTHER): Payer: Medicare Other | Admitting: Family Medicine

## 2021-06-29 ENCOUNTER — Encounter: Payer: Self-pay | Admitting: Family Medicine

## 2021-06-29 DIAGNOSIS — U071 COVID-19: Secondary | ICD-10-CM | POA: Diagnosis not present

## 2021-06-29 MED ORDER — BENZONATATE 200 MG PO CAPS: 200.0000 mg | ORAL_CAPSULE | Freq: Three times a day (TID) | ORAL | 1 refills | Status: DC | PRN

## 2021-06-29 NOTE — Progress Notes (Signed)
Virtual visit completed through WebEx or similar program Patient location: home  Provider location: [place] at [facility], office  Participants: Patient and me (unless stated otherwise below)  Pandemic considerations d/w pt.   Limitations and rationale for visit method d/w patient.  Patient agreed to proceed.   CC: covid  HPI: he tested positive yesterday.  Sx started about 06/24/21.  Today is day #5.  D/w pt.  Sx started with URI sx, runny nose, runny eyes, congestion.  Minimal sputum.  Some cough.  No fevers.  Not SOB.  No change in taste or smell.  No vomiting, no diarrhea. He doesn't feel sig unwell, he feels better today and yesterday, is improving.  He is vaccinated and boosted.    Meds and allergies reviewed.   ROS: Per HPI unless specifically indicated in ROS section   NAD Speech wnl  A/P: Covid.  Doing well clinically. D/w pt about options.  Can use Tessalon prn.  Antiviral options and emergent authorizations d/w pt.  Declined tx at this point with antivirals given improvement, minimal sx, and vaccination status. This is reasonable.  He'll update [place] as needed.  Supportive care in the meantime.  He agrees with plan.

## 2021-06-29 NOTE — Addendum Note (Signed)
Addended by: Sherrilee Gilles B on: 06/29/2021 03:41 PM   Modules accepted: Orders

## 2021-06-29 NOTE — Addendum Note (Signed)
Addended by: Kris Mouton on: 06/29/2021 04:18 PM   Modules accepted: Orders

## 2021-06-29 NOTE — Assessment & Plan Note (Signed)
  Covid.  Doing well clinically. D/w pt about options.  Can use Tessalon prn.  Antiviral options and emergent authorizations d/w pt.  Declined tx at this point with antivirals given improvement, minimal sx, and vaccination status. This is reasonable.  He'll update [place] as needed.  Supportive care in the meantime.  He agrees with plan.

## 2021-07-01 ENCOUNTER — Telehealth: Payer: Medicare Other | Admitting: Family Medicine

## 2021-07-02 ENCOUNTER — Other Ambulatory Visit: Payer: Self-pay | Admitting: Family Medicine

## 2021-07-05 ENCOUNTER — Telehealth: Payer: Self-pay

## 2021-07-05 DIAGNOSIS — U071 COVID-19: Secondary | ICD-10-CM | POA: Diagnosis not present

## 2021-07-05 NOTE — Telephone Encounter (Signed)
Noted. Thanks.

## 2021-07-05 NOTE — Telephone Encounter (Signed)
Pt c/o sinus inf requesting abx. Pt c/o runny eyes, head congestion, productive cough w/cloudy phlegm, and HA starting on 7/29, Fri. Pt checked temp and is afebrile. Pt went to [facility] and retested for covid and is positive. Pt denies SOB or any other symptoms. Pt is requesting antiviral and abx. Explained abx do not help viruses. Pt said he wants abx for sinus infection he thinks he also has. Advised this information would be sent to PCP and this office would f/u. Advised if any symptoms changed or worsened to contact office. Advised of ER precautions. Pt verbalized understanding.

## 2021-07-05 NOTE — Telephone Encounter (Signed)
[place] Night - Client TELEPHONE ADVICE RECORD [facility] Patient Name: Todd Gonzales Gender: Male DOB: 1941-10-08 Age: 80 Y 70 M 16 D Return Phone Number: [phone] (Primary), [phone] (Secondary) Address: [place] [address] Client [place] Night - Client Client Site [place] Physician Renford Dills - MD Contact Type Call Who Is Calling Patient / Member / Family / Caregiver Call Type Triage / Clinical Caller Name Raushan Gillis Relationship To Patient Spouse Return Phone Number [phone] (Secondary) Chief Complaint Ear Fullness or Congestion Reason for Call Symptomatic / Request for Health Information Initial Comment Both had covid. Both had telehealth appts with their doctors last Monday. He is not doing well today. He's got so much chest congestion. When he coughs it sounds horrible. Lots of nasal congestion. He coughs up all this phlegm. They will be retested tomorrow again for covid. Translation No Nurse Assessment Nurse: Della Goo, RN, Vicente Males Date/Time (Eastern Time): 07/04/2021 8:50:00 PM Confirm and document reason for call. If symptomatic, describe symptoms. ---Caller states he tested positive for covid a week ago, symptoms are worse today. Thinks he has a sinus infection, having drainage. Reports cough, congestion. Denies fever, SOB, difficulty breathing. Does the patient have any new or worsening symptoms? ---Yes Will a triage be completed? ---Yes Related visit to physician within the last 2 weeks? ---Yes Does the PT have any chronic conditions? (i.e. diabetes, asthma, this includes High risk factors for pregnancy, etc.) ---No Is this a behavioral health or substance abuse call? ---No Guidelines Guideline Title Affirmed Question Affirmed Notes Nurse Date/Time ([place] Time) COVID-19 - Diagnosed or Suspected Difficult to awaken or acting confused  (e.g., disoriented, slurred speech) Della Goo, [place], Vicente Males 07/04/2021 8:55:34 PM Disp. Time Eilene Ghazi Time) Disposition Final User PLEASE NOTE: All timestamps contained within this report are represented as [place] Standard Time. CONFIDENTIALTY NOTICE: This fax transmission is intended only for the addressee. It contains information that is legally privileged, confidential or otherwise protected from use or disclosure. If you are not the intended recipient, you are strictly prohibited from reviewing, disclosing, copying using or disseminating any of this information or taking any action in reliance on or regarding this information. If you have received this fax in error, please notify us immediately by telephone so that we can arrange for its return to [place]. Phone: [phone], Toll-Free: [phone], Fax: [phone] Page: 2 of 2 Call Id: [id number] 07/04/2021 9:04:27 PM Clinical Call Yes Della Goo, RN, Vicente Males Comments User: Juanda Crumble, RN Date/Time Eilene Ghazi Time): 07/04/2021 9:03:00 PM caller adamant that his wife has been having extreme difficulty waking him up, requiring vigorous shaking. triaged to call 911 now based on caller's response. caller insulted and swore at rn saying he didn't need that and triager didn't have training. States he just wants to make an appointment to be seen tomorrow. Declined to continue triage.

## 2021-07-05 NOTE — Telephone Encounter (Signed)
Called and advised patient on below. Patient scheduled VV tomorrow at 9:30 am to discuss tx options.

## 2021-07-05 NOTE — Telephone Encounter (Signed)
Retesting for covid wouldn't change the plan, since he had already tested positive.  I would not start antivirals at this point since it is unlikely to improve his situation.  Please off VV tomorrow at 9:30 with me- open a slot if needed.  We should talk about options before starting a med.    Let me know if he is on the schedule for tomorrow.  Thanks.

## 2021-07-06 ENCOUNTER — Telehealth (INDEPENDENT_AMBULATORY_CARE_PROVIDER_SITE_OTHER): Payer: Medicare Other | Admitting: Family Medicine

## 2021-07-06 ENCOUNTER — Encounter: Payer: Self-pay | Admitting: Family Medicine

## 2021-07-06 DIAGNOSIS — J019 Acute sinusitis, unspecified: Secondary | ICD-10-CM | POA: Diagnosis not present

## 2021-07-06 MED ORDER — HYDROCODONE BIT-HOMATROP MBR 5-1.5 MG/5ML PO SOLN: 5.0000 mL | Freq: Three times a day (TID) | ORAL | 0 refills | Status: DC | PRN

## 2021-07-06 MED ORDER — AMOXICILLIN-POT CLAVULANATE 875-125 MG PO TABS: 1.0000 | ORAL_TABLET | Freq: Two times a day (BID) | ORAL | 0 refills | Status: DC

## 2021-07-06 NOTE — Progress Notes (Signed)
Virtual visit completed through WebEx or similar program Patient location: home  Provider location:  at [facility], office  Participants: Patient and me (unless stated otherwise below)  Pandemic considerations d/w pt.   Limitations and rationale for visit method d/w patient.  Patient agreed to proceed.   CC: follow up.    HPI: lower BP noted, d/w pt.  He isn't lightheaded.  D/w pt about skipping benazepril/HCTZ today and tomorrow if needed.  He can restart when BP improves.  D/w pt about adequate fluid intake.    Recent tested positive again.  Cough, sinus drainage.  Chest congestion.  He can still take a deep breath.  Some sputum, discolored, not clear.  No fevers. No vomiting.  L maxillary pain, less commonly L forehead.  L>R sided sx.  Some occ wheeze.  Had been taking mucinex.   He can tolerate hydrocodone, d/w pt.    Meds and allergies reviewed.   ROS: Per HPI unless specifically indicated in ROS section   NAD Speech wnl  A/P: Presumed sinusitis, likely bacterial/post viral.  D/w pt about lack of evidence/utility for antivirals at this point.  Wouldn't need to continue covid testing.  Okay for outpatient f/u.  Would start augmentin.  He can tolerate hydrocodone, d/w pt.  Can use for cough.   D/w pt about skipping benazepril/HCTZ today and tomorrow if needed.  He can restart when BP improves.  D/w pt about adequate fluid intake.   Rest and fluids o/w.  He'll update me as needed.

## 2021-07-06 NOTE — Assessment & Plan Note (Signed)
  Presumed sinusitis, likely bacterial/post viral.  D/w pt about lack of evidence/utility for antivirals at this point.  Wouldn't need to continue covid testing.  Okay for outpatient f/u.  Would start augmentin.  He can tolerate hydrocodone, d/w pt.  Can use for cough.   D/w pt about skipping benazepril/HCTZ today and tomorrow if needed.  He can restart when BP improves.  D/w pt about adequate fluid intake.   Rest and fluids o/w.  He'll update me as needed.

## 2021-07-29 DIAGNOSIS — C44329 Squamous cell carcinoma of skin of other parts of face: Secondary | ICD-10-CM | POA: Diagnosis not present

## 2021-08-03 ENCOUNTER — Other Ambulatory Visit: Payer: Self-pay

## 2021-08-03 ENCOUNTER — Encounter: Payer: Self-pay | Admitting: Physician Assistant

## 2021-08-03 ENCOUNTER — Other Ambulatory Visit: Payer: Self-pay | Admitting: Physician Assistant

## 2021-08-03 ENCOUNTER — Ambulatory Visit: Payer: Medicare Other | Admitting: Physician Assistant

## 2021-08-03 DIAGNOSIS — Z85828 Personal history of other malignant neoplasm of skin: Secondary | ICD-10-CM

## 2021-08-03 DIAGNOSIS — D0471 Carcinoma in situ of skin of right lower limb, including hip: Secondary | ICD-10-CM | POA: Diagnosis not present

## 2021-08-03 DIAGNOSIS — D485 Neoplasm of uncertain behavior of skin: Secondary | ICD-10-CM

## 2021-08-03 DIAGNOSIS — Z1283 Encounter for screening for malignant neoplasm of skin: Secondary | ICD-10-CM

## 2021-08-03 DIAGNOSIS — L570 Actinic keratosis: Secondary | ICD-10-CM

## 2021-08-03 NOTE — Patient Instructions (Signed)

## 2021-08-05 ENCOUNTER — Encounter: Payer: Self-pay | Admitting: Physician Assistant

## 2021-08-05 NOTE — Progress Notes (Addendum)
   Follow-Up Visit   Subjective  Todd Gonzales is a 80 y.o. male who presents for the following: Follow-up (Suppose to be 3 months follow up- left temporal scalp & left nasal sidewall- per patient lots of new spots. Personal history of multiple non mole skin cancers. ).   The following portions of the chart were reviewed this encounter and updated as appropriate:  Tobacco  Allergies  Meds  Problems  Med Hx  Surg Hx  Fam Hx      Objective  Well appearing patient in no apparent distress; mood and affect are within normal limits.  A full examination was performed including scalp, head, eyes, ears, nose, lips, neck, chest, axillae, abdomen, back, buttocks, bilateral upper extremities, bilateral lower extremities, hands, feet, fingers, toes, fingernails, and toenails. All findings within normal limits unless otherwise noted below.  facial (8) Erythematous patches with gritty scale.  Right Lower Leg - Anterior [place]cano growth on pink base      Assessment & Plan  AK (actinic keratosis) (8) facial  Destruction of lesion - facial Complexity: simple   Destruction method: cryotherapy   Informed consent: discussed and consent obtained   Timeout:  patient name, date of birth, surgical site, and procedure verified Lesion destroyed using liquid nitrogen: Yes   Cryotherapy cycles:  1 Outcome: patient tolerated procedure well with no complications   Post-procedure details: wound care instructions given    Carcinoma in situ of skin of right lower extremity including hip Right Lower Leg - Anterior  Skin / nail biopsy Type of biopsy: tangential   Informed consent: discussed and consent obtained   Timeout: patient name, date of birth, surgical site, and procedure verified   Procedure prep:  Patient was prepped and draped in usual sterile fashion (Non sterile) Prep type:  Chlorhexidine Anesthesia: the lesion was anesthetized in a standard fashion   Anesthetic:  1% lidocaine w/  epinephrine 1-100,000 local infiltration Instrument used: flexible razor blade   Outcome: patient tolerated procedure well   Post-procedure details: wound care instructions given    Destruction of lesion Complexity: simple   Destruction method: electrodesiccation and curettage   Informed consent: discussed and consent obtained   Timeout:  patient name, date of birth, surgical site, and procedure verified Anesthesia: the lesion was anesthetized in a standard fashion   Anesthetic:  1% lidocaine w/ epinephrine 1-100,000 local infiltration Curettage performed in three different directions: Yes   Electrodesiccation performed over the curetted area: Yes   Curettage cycles:  3 Margin per side (cm):  0.1 Final wound size (cm):  1 Hemostasis achieved with:  aluminum chloride Outcome: patient tolerated procedure well with no complications   Post-procedure details: wound care instructions given    Specimen 1 - Surgical pathology Differential Diagnosis: ka cauret and cautery tx with bx  Check Margins: No No atypical nevi noted at the time of the visit.   I, Trevaun Rendleman, PA-C, have reviewed all documentation's for this visit.  The documentation on 08/11/21 for the exam, diagnosis, procedures and orders are all accurate and complete.

## 2021-08-11 ENCOUNTER — Telehealth: Payer: Self-pay | Admitting: *Deleted

## 2021-08-11 NOTE — Telephone Encounter (Signed)
-----   Message from Warren Danes, [place] sent at 08/11/2021  8:59 AM EDT ----- Rtc if recurs

## 2021-08-11 NOTE — Telephone Encounter (Signed)
Left message for patient to return our phone call for pathology results.

## 2021-08-12 ENCOUNTER — Telehealth: Payer: Self-pay | Admitting: Physician Assistant

## 2021-08-12 NOTE — Telephone Encounter (Signed)
Pathology to patient.  °

## 2021-08-12 NOTE — Telephone Encounter (Signed)
Left message for patient to call us back about pathology results.

## 2021-08-12 NOTE — Telephone Encounter (Signed)
Patient left message on office voice mail that he was calling for pathology results from last visit with Kelli Sheffield, PA-C. 

## 2021-08-16 ENCOUNTER — Other Ambulatory Visit: Payer: Self-pay | Admitting: Family Medicine

## 2021-08-16 ENCOUNTER — Telehealth: Payer: Self-pay

## 2021-08-16 NOTE — Telephone Encounter (Signed)
-----   Message from Warren Danes, [place] sent at 08/11/2021  8:59 AM EDT ----- Rtc if recurs

## 2021-08-16 NOTE — Telephone Encounter (Signed)
patient made aware at the visit treated with biopsy, left message x2

## 2021-08-18 ENCOUNTER — Other Ambulatory Visit: Payer: Self-pay | Admitting: Family Medicine

## 2021-08-18 NOTE — Telephone Encounter (Signed)
Sent. Thanks.   

## 2021-08-18 NOTE — Telephone Encounter (Signed)
Refill request for Gabapentin 300 mg caps  LOV - 07/06/21 Next OV - not scheduled Last refill - 06/13/21 #90/0

## 2021-08-31 ENCOUNTER — Ambulatory Visit (INDEPENDENT_AMBULATORY_CARE_PROVIDER_SITE_OTHER): Payer: Medicare Other | Admitting: Dermatology

## 2021-08-31 ENCOUNTER — Encounter: Payer: Self-pay | Admitting: Dermatology

## 2021-08-31 ENCOUNTER — Other Ambulatory Visit: Payer: Self-pay

## 2021-08-31 ENCOUNTER — Other Ambulatory Visit: Payer: Self-pay | Admitting: Dermatology

## 2021-08-31 DIAGNOSIS — D0439 Carcinoma in situ of skin of other parts of face: Secondary | ICD-10-CM

## 2021-08-31 DIAGNOSIS — D485 Neoplasm of uncertain behavior of skin: Secondary | ICD-10-CM

## 2021-08-31 DIAGNOSIS — L820 Inflamed seborrheic keratosis: Secondary | ICD-10-CM | POA: Diagnosis not present

## 2021-08-31 DIAGNOSIS — L570 Actinic keratosis: Secondary | ICD-10-CM | POA: Diagnosis not present

## 2021-08-31 NOTE — Patient Instructions (Signed)

## 2021-09-10 DIAGNOSIS — Z0000 Encounter for general adult medical examination without abnormal findings: Secondary | ICD-10-CM | POA: Diagnosis not present

## 2021-09-10 DIAGNOSIS — E7800 Pure hypercholesterolemia, unspecified: Secondary | ICD-10-CM | POA: Diagnosis not present

## 2021-09-10 DIAGNOSIS — I10 Essential (primary) hypertension: Secondary | ICD-10-CM | POA: Diagnosis not present

## 2021-09-10 DIAGNOSIS — R7302 Impaired glucose tolerance (oral): Secondary | ICD-10-CM | POA: Diagnosis not present

## 2021-09-17 ENCOUNTER — Encounter: Payer: Self-pay | Admitting: Dermatology

## 2021-09-17 NOTE — Progress Notes (Signed)
Follow-Up Visit   Subjective  Todd Gonzales is a 80 y.o. male who presents for the following: Skin Problem (New crusty spots- left cheek & right cheek - also wants opinion on nose).  Several persistent crusts Location:  Duration:  Quality:  Associated Signs/Symptoms: Modifying Factors:  Severity:  Timing: Context:   Objective  Well appearing patient in no apparent distress; mood and affect are within normal limits. Left Zygomatic Area 1 cm pink crust       Left Buccal Cheek 1 cm pink crust       Mid Forehead Most remaining actinic keratoses are small and currently not bothersome to patient    A focused examination was performed including head and neck. Relevant physical exam findings are noted in the Assessment and Plan.   Assessment & Plan    Neoplasm of uncertain behavior of skin (2) Left Zygomatic Area  Skin / nail biopsy Type of biopsy: tangential   Informed consent: discussed and consent obtained   Timeout: patient name, date of birth, surgical site, and procedure verified   Procedure prep:  Patient was prepped and draped in usual sterile fashion (Non sterile) Prep type:  Chlorhexidine Anesthesia: the lesion was anesthetized in a standard fashion   Anesthetic:  1% lidocaine w/ epinephrine 1-100,000 local infiltration Instrument used: flexible razor blade   Outcome: patient tolerated procedure well   Post-procedure details: wound care instructions given    Destruction of lesion Complexity: simple   Destruction method: electrodesiccation and curettage   Informed consent: discussed and consent obtained   Timeout:  patient name, date of birth, surgical site, and procedure verified Anesthesia: the lesion was anesthetized in a standard fashion   Anesthetic:  1% lidocaine w/ epinephrine 1-100,000 local infiltration Curettage performed in three different directions: Yes   Curettage cycles:  1 Lesion length (cm):  1.2 Lesion width (cm):  1.2 Margin  per side (cm):  0 Final wound size (cm):  1.2 Hemostasis achieved with:  aluminum chloride and ferric subsulfate Outcome: patient tolerated procedure well with no complications   Post-procedure details: wound care instructions given   Additional details:  Wound innoculated with 5 fluorouracil solution.  Specimen 1 - Surgical pathology Differential Diagnosis: bcc vs scc- txpbx  Check Margins: No  Left Buccal Cheek  Skin / nail biopsy Type of biopsy: tangential   Informed consent: discussed and consent obtained   Timeout: patient name, date of birth, surgical site, and procedure verified   Procedure prep:  Patient was prepped and draped in usual sterile fashion (Non sterile) Prep type:  Chlorhexidine Anesthesia: the lesion was anesthetized in a standard fashion   Anesthetic:  1% lidocaine w/ epinephrine 1-100,000 local infiltration Instrument used: flexible razor blade   Outcome: patient tolerated procedure well   Post-procedure details: wound care instructions given    Destruction of lesion Complexity: simple   Destruction method: electrodesiccation and curettage   Informed consent: discussed and consent obtained   Timeout:  patient name, date of birth, surgical site, and procedure verified Anesthesia: the lesion was anesthetized in a standard fashion   Anesthetic:  1% lidocaine w/ epinephrine 1-100,000 local infiltration Curettage performed in three different directions: Yes   Curettage cycles:  1 Lesion length (cm):  1 Lesion width (cm):  1 Margin per side (cm):  0 Final wound size (cm):  1 Hemostasis achieved with:  aluminum chloride Outcome: patient tolerated procedure well with no complications   Post-procedure details: wound care instructions given  Specimen 2 - Surgical pathology Differential Diagnosis: bcc vs scc- txpbx  Check Margins: No  After shave biopsy the base of each lesion was treated with curettage plus cautery  AK (actinic keratosis) Mid  Forehead  Further intervention deferred.      I, Lavonna Monarch, MD, have reviewed all documentation for this visit.  The documentation on 09/17/21 for the exam, diagnosis, procedures, and orders are all accurate and complete.

## 2021-10-26 ENCOUNTER — Emergency Department
Admission: EM | Admit: 2021-10-26 | Discharge: 2021-10-26 | Disposition: A | Discharge status: Home / Self Care | Payer: Medicare Other | Attending: Emergency Medicine | Admitting: Emergency Medicine

## 2021-10-26 ENCOUNTER — Encounter: Payer: Self-pay | Admitting: Emergency Medicine

## 2021-10-26 ENCOUNTER — Other Ambulatory Visit: Payer: Self-pay

## 2021-10-26 DIAGNOSIS — Z96642 Presence of left artificial hip joint: Secondary | ICD-10-CM | POA: Insufficient documentation

## 2021-10-26 DIAGNOSIS — S61412A Laceration without foreign body of left hand, initial encounter: Secondary | ICD-10-CM | POA: Insufficient documentation

## 2021-10-26 DIAGNOSIS — W01198A Fall on same level from slipping, tripping and stumbling with subsequent striking against other object, initial encounter: Secondary | ICD-10-CM | POA: Insufficient documentation

## 2021-10-26 DIAGNOSIS — S61012A Laceration without foreign body of left thumb without damage to nail, initial encounter: Secondary | ICD-10-CM | POA: Insufficient documentation

## 2021-10-26 DIAGNOSIS — Z8616 Personal history of COVID-19: Secondary | ICD-10-CM | POA: Diagnosis not present

## 2021-10-26 DIAGNOSIS — S61213A Laceration without foreign body of left middle finger without damage to nail, initial encounter: Secondary | ICD-10-CM | POA: Diagnosis not present

## 2021-10-26 DIAGNOSIS — Z79899 Other long term (current) drug therapy: Secondary | ICD-10-CM | POA: Diagnosis not present

## 2021-10-26 DIAGNOSIS — Z85828 Personal history of other malignant neoplasm of skin: Secondary | ICD-10-CM | POA: Insufficient documentation

## 2021-10-26 DIAGNOSIS — I10 Essential (primary) hypertension: Secondary | ICD-10-CM | POA: Insufficient documentation

## 2021-10-26 DIAGNOSIS — Y9301 Activity, walking, marching and hiking: Secondary | ICD-10-CM | POA: Insufficient documentation

## 2021-10-26 DIAGNOSIS — S6992XA Unspecified injury of left wrist, hand and finger(s), initial encounter: Secondary | ICD-10-CM | POA: Diagnosis present

## 2021-10-26 DIAGNOSIS — Z23 Encounter for immunization: Secondary | ICD-10-CM | POA: Diagnosis not present

## 2021-10-26 MED ORDER — LIDOCAINE HCL (PF) 1 % IJ SOLN
25.0000 mL | Freq: Once | INTRAMUSCULAR | Status: AC
  Administered 2021-10-26: 25 mL
  Filled 2021-10-26: qty 25

## 2021-10-26 MED ORDER — TETANUS-DIPHTH-ACELL PERTUSSIS 5-2.5-18.5 LF-MCG/0.5 IM SUSY
0.5000 mL | PREFILLED_SYRINGE | Freq: Once | INTRAMUSCULAR | Status: AC
  Administered 2021-10-26: 0.5 mL via INTRAMUSCULAR
  Filled 2021-10-26: qty 0.5

## 2021-10-26 MED ORDER — CEPHALEXIN 500 MG PO CAPS: 1000.0000 mg | ORAL_CAPSULE | Freq: Two times a day (BID) | ORAL | 0 refills | Status: DC

## 2021-10-26 MED ORDER — OXYCODONE-ACETAMINOPHEN 5-325 MG PO TABS
1.0000 | ORAL_TABLET | Freq: Once | ORAL | Status: AC
Start: 2021-10-26 — End: 2021-10-26
  Administered 2021-10-26: 1 via ORAL
  Filled 2021-10-26: qty 1

## 2021-10-26 NOTE — ED Notes (Signed)
Patient states fell into footboard of bed with a glass an it shatter in his hand. Radial pulse strong.

## 2021-10-26 NOTE — ED Notes (Signed)
Dressing to left hand placed by providers noted to be clean, dry and intact

## 2021-10-26 NOTE — ED Provider Notes (Signed)
[facility] Emergency Department Provider Note  ____________________________________________  Time seen: Approximately 3:32 PM  I have reviewed the triage vital signs and the nursing notes.   HISTORY  Chief Complaint Hand Injury    HPI Todd Gonzales is a 80 y.o. male who presents the emergency department with multiple lacerations to his left hand.  Patient states that he was carrying a glass when his ankle gave out causing him to trip.  Patient states that he instinctively reached forward to catch himself forgetting that he had a glass in his hand.  The glass broke and caused lacerations to his palm, thumb and middle finger.  Bleeding controlled at this time with direct pressure.  Patient denies any loss of range of motion to his digits.  No other injury or complaint.  Unsure of his last tetanus shot       Past Medical History:  Diagnosis Date   Arthritis    BPH (benign prostatic hyperplasia)    nocturia x4 at baseline   H/O hiatal hernia    Hemorrhoids    Hyperlipidemia    Hypertension    Dr Osborne Casco- LOV with clearance 3/13 on chart   IGT (impaired glucose tolerance)    last AIC 5.5- diet controlled- per office note 3/13   SCC (squamous cell carcinoma) 02/10/2005   scc in situ-left forearm.   SCCA (squamous cell carcinoma) of skin 02/10/2005   Right Outer Eye (in situ) (curet and excision)   SCCA (squamous cell carcinoma) of skin 02/13/2006   Right Ear (well diff) (curet and 5FU)   SCCA (squamous cell carcinoma) of skin 04/05/2006   Left Ear Bowl (in situ) (tx p bx)   SCCA (squamous cell carcinoma) of skin 07/06/2010   Right Cheek (in situ) (curet and zyclara)   SCCA (squamous cell carcinoma) of skin 04/27/2009   Left Upper Forearm (in situ) (curet 5FU)   SCCA (squamous cell carcinoma) of skin 08/28/2014   Right Cheek (well diff)   SCCA (squamous cell carcinoma) of skin 03/21/2016   Left Jawline Ant (well diff) (curet and 5FU)   SCCA (squamous  cell carcinoma) of skin 01/19/2017   Right Hand (in situ) (tx p bx)   SCCA (squamous cell carcinoma) of skin 03/04/1840   Left Jawline Inf (in situ) (curet and 5FU)   SCCA (squamous cell carcinoma) of skin 12/23/2020   Left Forehead (well diff) (tx p bx)   SCCA (squamous cell carcinoma) of skin 12/23/2020   Left Submandibular Area (in situ) (tx p bx)   Seasonal allergies    Spinal stenosis    Squamous cell carcinoma of skin 08/31/2020   in situ-left jawline cheek   Squamous cell carcinoma of skin 04/27/2021   infil- left nasal sidewall(MOHS)   Squamous cell carcinoma of skin of face 04/27/2021   infil- Left Temporal scalp (MOHS   UTI (lower urinary tract infection)    required admission to [facility]    Patient Active Problem List   Diagnosis Date Noted   Acute non-recurrent sinusitis 07/06/2021   COVID 06/29/2021   Thumb pain 03/31/2020   Muscle cramp 03/31/2020   Cervical radiculopathy 12/13/2019   Right arm pain 10/10/2019   Vitamin D deficiency 10/10/2019   Incontinence of feces    Post-op pain    Hyperglycemia    Status post laminectomy    Postoperative pain after spinal surgery 11/30/2018   Myelopathy concurrent with and due to stenosis of lumbar spine ([place]) 11/20/2018   Microalbuminuria 09/02/2016  HLD (hyperlipidemia) 09/02/2016   Spinal stenosis    Other fatigue 08/24/2016   HTN (hypertension) 08/24/2016   BPH (benign prostatic hyperplasia) 08/24/2016   Nocturia 08/24/2016   Advance care planning 08/24/2016   Osteoarthritis 10/28/2014   SCC (squamous cell carcinoma), face 09/25/2014   S/P Left THA, AA 03/20/2012    Past Surgical History:  Procedure Laterality Date   BACK SURGERY  2008   Laminectomy L4-5   JOINT REPLACEMENT Bilateral    bil   KNEE ARTHROSCOPY     right   LUMBAR LAMINECTOMY/DECOMPRESSION MICRODISCECTOMY Bilateral 11/20/2018   Procedure: Bilateral Thoracic Twelve to Lumbar One Laminectomy;  Surgeon: Kristeen Miss, MD;  Location: [facility];   Service: Neurosurgery;  Laterality: Bilateral;  posterior   TOTAL HIP ARTHROPLASTY  03/20/2012   Procedure: TOTAL HIP ARTHROPLASTY ANTERIOR APPROACH;  Surgeon: Mauri Pole, MD;  Location: [facility];  Service: Orthopedics;  Laterality: Left;    Prior to Admission medications   Medication Sig Start Date End Date Taking? Authorizing Provider  cephALEXin (KEFLEX) 500 MG capsule Take 2 capsules (1,000 mg total) by mouth 2 (two) times daily. 10/26/21  Yes Yong Grieser, Charline Bills, PA-C  amoxicillin-clavulanate (AUGMENTIN) 875-125 MG tablet Take 1 tablet by mouth 2 (two) times daily. 07/06/21   Tonia Ghent, MD  benazepril-hydrochlorthiazide (LOTENSIN HCT) 20-12.5 MG tablet Take one half tablet by mouth daily.  Please make an appointment for physical exam prior to further refills. 08/13/20   Tonia Ghent, MD  benzonatate (TESSALON) 200 MG capsule Take 1 capsule (200 mg total) by mouth 3 (three) times daily as needed. 06/29/21   Tonia Ghent, MD  celecoxib (CELEBREX) 200 MG capsule TAKE 1 CAPSULE BY MOUTH TWICE DAILY 07/02/21   Tonia Ghent, MD  Cholecalciferol (VITAMIN D-1000 MAX ST) 25 MCG (1000 UT) tablet Take 2 tablets (2,000 Units total) by mouth daily. 10/03/19   Tonia Ghent, MD  gabapentin (NEURONTIN) 300 MG capsule TAKE 1 CAPSULE BY MOUTH ONCE DAILY 08/18/21   Tonia Ghent, MD  HYDROcodone bit-homatropine (HYCODAN) 5-1.5 MG/5ML syrup Take 5 mLs by mouth every 8 (eight) hours as needed for cough (sedation caution). 07/06/21   Tonia Ghent, MD  oxymetazoline (AFRIN) 0.05 % nasal spray Place 2 sprays into the nose at bedtime as needed for congestion (allergies).     [provider]  sodium chloride (OCEAN) 0.65 % nasal spray Place 1 spray into the nose daily as needed for congestion.     [provider]  tamsulosin (FLOMAX) 0.4 MG CAPS capsule TAKE 1 CAPSULE BY MOUTH ONCE DAILY 08/18/21   Tonia Ghent, MD    Allergies Codeine  Family History  Problem Relation  Age of Onset   Arthritis Mother    Lung cancer Father    Asthma Father    Colon cancer Neg Hx    Prostate cancer Neg Hx     Social History Social History   Tobacco Use   Smoking status: Never   Smokeless tobacco: Never  Vaping Use   Vaping Use: Never used  Substance Use Topics   Alcohol use: Yes    Comment: socially, wine   Drug use: No     Review of Systems  Constitutional: No fever/chills Eyes: No visual changes. No discharge ENT: No upper respiratory complaints. Cardiovascular: no chest pain. Respiratory: no cough. No SOB. Gastrointestinal: No abdominal pain.  No nausea, no vomiting.  No diarrhea.  No constipation. Musculoskeletal: Positive for lacerations to the  left hand Skin: Negative for rash, abrasions, lacerations, ecchymosis. Neurological: Negative for headaches, focal weakness or numbness.  10 System ROS otherwise negative.  ____________________________________________   PHYSICAL EXAM:  VITAL SIGNS: ED Triage Vitals [10/26/21 1346]  Enc Vitals Group     BP (!) 156/89     Pulse Rate 75     Resp (!) 22     Temp 98 F (36.7 C)     Temp Source Oral     SpO2 97 %     Weight 238 lb 1.6 oz (108 kg)     Height 5\' 10"  (1.778 m)     Head Circumference      Peak Flow      Pain Score 0     Pain Loc      Pain Edu?      Excl. in [place]?      Constitutional: Alert and oriented. Well appearing and in no acute distress. Eyes: Conjunctivae are normal. PERRL. EOMI. Head: Atraumatic. ENT:      Ears:       Nose: No congestion/rhinnorhea.      Mouth/Throat: Mucous membranes are moist.  Neck: No stridor.   Cardiovascular: Normal rate, regular rhythm. Normal S1 and S2.  Good peripheral circulation. Respiratory: Normal respiratory effort without tachypnea or retractions. Lungs CTAB. Good air entry to the bases with no decreased or absent breath sounds. Musculoskeletal: Full range of motion to all extremities. No gross deformities appreciated.  Visualization of  the left hand reveals 3 distinct lacerations, 1 to the thumb, 1 to the palm, 1 to the middle finger.  Each of the lacerations measures approximately 4 cm in length.  No active bleeding.  No visualized foreign body.  Range of motion intact all digits at this time.  Sensation intact all digits.  Capillary refill less than 2 seconds all digits. Neurologic:  Normal speech and language. No gross focal neurologic deficits are appreciated.  Skin:  Skin is warm, dry and intact. No rash noted. Psychiatric: Mood and affect are normal. Speech and behavior are normal. Patient exhibits appropriate insight and judgement.   ____________________________________________   LABS (all labs ordered are listed, but only abnormal results are displayed)  Labs Reviewed - No data to display ____________________________________________  EKG   ____________________________________________  RADIOLOGY   No results found.  ____________________________________________    PROCEDURES  Procedure(s) performed:    Marland KitchenMarland KitchenLaceration Repair  Date/Time: 10/26/2021 4:57 PM Performed by: Darletta Moll, PA-C Authorized by: Darletta Moll, PA-C   Consent:    Consent obtained:  Verbal   Consent given by:  Patient   Risks discussed:  Infection and pain Universal protocol:    Procedure explained and questions answered to patient or proxy's satisfaction: yes     Immediately prior to procedure, a time out was called: yes     Patient identity confirmed:  Verbally with patient Anesthesia:    Anesthesia method:  Local infiltration   Local anesthetic:  Lidocaine 1% w/o epi Laceration details:    Location:  Hand   Hand location:  L palm   Length (cm):  1 Exploration:    Hemostasis achieved with:  Direct pressure   Imaging outcome: foreign body not noted     Wound exploration: wound explored through full range of motion and entire depth of wound visualized     Wound extent: no foreign bodies/material  noted, no nerve damage noted, no tendon damage noted and no underlying fracture noted  Contaminated: no   Treatment:    Area cleansed with:  Povidone-iodine and saline   Amount of cleaning:  Extensive   Irrigation solution:  Sterile saline   Irrigation volume:  500 ml   Irrigation method:  Syringe Skin repair:    Repair method:  Sutures   Suture size:  4-0   Suture material:  Nylon   Suture technique:  Simple interrupted   Number of sutures:  2 Approximation:    Approximation:  Close Repair type:    Repair type:  Simple Post-procedure details:    Dressing:  Tube gauze   Procedure completion:  Tolerated well, no immediate complications .Marland KitchenLaceration Repair  Date/Time: 10/26/2021 4:58 PM Performed by: Darletta Moll, PA-C Authorized by: Darletta Moll, PA-C   Consent:    Consent obtained:  Verbal   Consent given by:  Patient   Risks discussed:  Infection and pain Universal protocol:    Procedure explained and questions answered to patient or proxy's satisfaction: yes     Immediately prior to procedure, a time out was called: yes     Patient identity confirmed:  Verbally with patient Anesthesia:    Anesthesia method:  Nerve block   Block location:  L thumb   Block needle gauge:  27 G   Block anesthetic:  Lidocaine 1% w/o epi   Block technique:  Digital block   Block injection procedure:  Anatomic landmarks identified, introduced needle, negative aspiration for blood and incremental injection   Block outcome:  Anesthesia achieved Laceration details:    Location:  Finger   Finger location:  L thumb   Length (cm):  5 Exploration:    Hemostasis achieved with:  Direct pressure   Imaging outcome: foreign body not noted     Wound exploration: wound explored through full range of motion and entire depth of wound visualized     Wound extent: no foreign bodies/material noted, no nerve damage noted, no tendon damage noted and no underlying fracture noted      Contaminated: no   Treatment:    Area cleansed with:  Povidone-iodine and saline   Amount of cleaning:  Extensive   Irrigation solution:  Sterile saline   Irrigation volume:  700 ml   Irrigation method:  Syringe Skin repair:    Repair method:  Sutures   Suture size:  4-0   Suture material:  Nylon   Suture technique:  Simple interrupted   Number of sutures:  9 Approximation:    Approximation:  Close Repair type:    Repair type:  Simple Post-procedure details:    Dressing:  Tube gauze   Procedure completion:  Tolerated well, no immediate complications .Marland KitchenLaceration Repair  Date/Time: 10/26/2021 5:09 PM Performed by: Darletta Moll, PA-C Authorized by: Darletta Moll, PA-C   Consent:    Consent obtained:  Verbal   Consent given by:  Patient   Risks discussed:  Infection, pain and poor wound healing Universal protocol:    Procedure explained and questions answered to patient or proxy's satisfaction: yes     Immediately prior to procedure, a time out was called: yes     Patient identity confirmed:  Verbally with patient Anesthesia:    Anesthesia method:  Nerve block   Block location:  Middle finger L hand   Block needle gauge:  27 G   Block anesthetic:  Lidocaine 1% w/o epi   Block technique:  Digital block   Block injection procedure:  Anatomic landmarks identified, introduced  needle, negative aspiration for blood and incremental injection   Block outcome:  Anesthesia achieved Laceration details:    Location:  Finger   Finger location:  L long finger   Length (cm):  4 Exploration:    Hemostasis achieved with:  Direct pressure   Wound extent: no foreign bodies/material noted, no nerve damage noted, no tendon damage noted and no underlying fracture noted   Treatment:    Area cleansed with:  Povidone-iodine   Amount of cleaning:  Extensive   Irrigation solution:  Sterile saline   Irrigation volume:  1L   Irrigation method:  Syringe Skin repair:    Repair  method:  Sutures   Suture size:  4-0   Suture material:  Nylon   Suture technique:  Simple interrupted   Number of sutures:  9 Approximation:    Approximation:  Close Repair type:    Repair type:  Simple Post-procedure details:    Dressing:  Tube gauze   Procedure completion:  Tolerated well, no immediate complications    Medications  lidocaine (PF) (XYLOCAINE) 1 % injection 25 mL (has no administration in time range)  oxyCODONE-acetaminophen (PERCOCET/ROXICET) 5-325 MG per tablet 1 tablet (1 tablet Oral Given 10/26/21 1553)  Tdap (BOOSTRIX) injection 0.5 mL (0.5 mLs Intramuscular Given 10/26/21 1554)     ____________________________________________   INITIAL IMPRESSION / ASSESSMENT AND PLAN / ED COURSE  Pertinent labs & imaging results that were available during my care of the patient were reviewed by me and considered in my medical decision making (see chart for details).  Review of the [place] CSRS was performed in accordance of the [place] prior to dispensing any controlled drugs.           Patient's diagnosis is consistent with thumb, middle finger and hand laceration of the left hand.  Patient presents emergency department multiple lacerations after tripping while carrying a glass which shattered in his hand.  Patient did not sustain any other injuries, did require an updated tetanus shot today.  Lacerations are closed as described above.  Wound care instructions are discussed at length with the patient and his wife.  Patient be placed on antibiotics prophylactically given the multiple lacerations to the hand.  Patient will follow-up in 7 to 10 days with his primary care for suture removal..  Return precautions are discussed with the patient and his wife.  Patient is given ED precautions to return to the ED for any worsening or new symptoms.     ____________________________________________  FINAL CLINICAL IMPRESSION(S) / ED DIAGNOSES  Final diagnoses:  Laceration of left  hand without foreign body, initial encounter  Laceration of left middle finger without foreign body without damage to nail, initial encounter  Laceration of left thumb without foreign body without damage to nail, initial encounter      NEW MEDICATIONS STARTED DURING THIS VISIT:  ED Discharge Orders          Ordered    cephALEXin (KEFLEX) 500 MG capsule  2 times daily        10/26/21 1748                This chart was dictated using voice recognition software/Dragon. Despite best efforts to proofread, errors can occur which can change the meaning. Any change was purely unintentional.    Darletta Moll, PA-C 10/26/21 1748    Duffy Bruce, MD 10/27/21 [phone]

## 2021-10-26 NOTE — ED Notes (Signed)
Dressing present to left hand removed, wounds redressed with clean gauze.

## 2021-10-26 NOTE — ED Triage Notes (Signed)
Three lacerations to left hand.  States he was walking with a glass in his hand a fell forward, hitting hand on bed.  Small amount of bleeding noted to middle finger.  DSD applied.

## 2021-11-03 DIAGNOSIS — Z4802 Encounter for removal of sutures: Secondary | ICD-10-CM | POA: Diagnosis not present

## 2021-11-03 DIAGNOSIS — I10 Essential (primary) hypertension: Secondary | ICD-10-CM | POA: Diagnosis not present

## 2021-11-12 DIAGNOSIS — S61412A Laceration without foreign body of left hand, initial encounter: Secondary | ICD-10-CM | POA: Diagnosis not present

## 2021-11-19 DIAGNOSIS — M170 Bilateral primary osteoarthritis of knee: Secondary | ICD-10-CM | POA: Diagnosis not present

## 2021-11-24 ENCOUNTER — Encounter: Payer: Self-pay | Admitting: Dermatology

## 2021-11-24 ENCOUNTER — Other Ambulatory Visit: Payer: Self-pay

## 2021-11-24 ENCOUNTER — Ambulatory Visit: Payer: Medicare Other | Admitting: Dermatology

## 2021-11-24 DIAGNOSIS — I872 Venous insufficiency (chronic) (peripheral): Secondary | ICD-10-CM

## 2021-11-24 DIAGNOSIS — D692 Other nonthrombocytopenic purpura: Secondary | ICD-10-CM

## 2021-11-24 DIAGNOSIS — L989 Disorder of the skin and subcutaneous tissue, unspecified: Secondary | ICD-10-CM | POA: Diagnosis not present

## 2021-11-24 MED ORDER — CLOBETASOL PROPIONATE 0.05 % EX OINT: TOPICAL_OINTMENT | CUTANEOUS | 2 refills | Status: DC

## 2021-12-03 DIAGNOSIS — M170 Bilateral primary osteoarthritis of knee: Secondary | ICD-10-CM | POA: Diagnosis not present

## 2021-12-10 DIAGNOSIS — M170 Bilateral primary osteoarthritis of knee: Secondary | ICD-10-CM | POA: Diagnosis not present

## 2021-12-16 DIAGNOSIS — M170 Bilateral primary osteoarthritis of knee: Secondary | ICD-10-CM | POA: Diagnosis not present

## 2021-12-25 ENCOUNTER — Encounter: Payer: Self-pay | Admitting: Dermatology

## 2021-12-25 NOTE — Progress Notes (Signed)
° °  Follow-Up Visit   Subjective  Todd Gonzales is a 81 y.o. male who presents for the following: Follow-up (New lesion on both lower legs x 3 months- no itch no bleed, new lesion on left cheek x 2 weeks seeems to be going away).  New crusts on legs plus left cheek. Location:  Duration:  Quality:  Associated Signs/Symptoms: Modifying Factors:  Severity:  Timing: Context:   Objective  Well appearing patient in no apparent distress; mood and affect are within normal limits. Left Forearm - Anterior, Right Forearm - Anterior Multiple ecchymoses forearms and dorsal hands.  Denies other abnormal bleeding.  Left Malar Cheek Waxy 1 cm crust compatible with superficial carcinoma.  Left Lower Leg - Anterior, Right Lower Leg - Anterior Moderate edema, dermatitis, hemosiderosis.  Reduced pedal pulses.         A focused examination was performed including head, neck, arms, legs.. Relevant physical exam findings are noted in the Assessment and Plan.   Assessment & Plan    Solar purpura ([place]) (2) Left Forearm - Anterior; Right Forearm - Anterior  May use over-the-counter Dermend daily after bathing.  Bumps on skin Left Malar Cheek  Will bx after the holidays   Stasis dermatitis of both legs Left Lower Leg - Anterior; Right Lower Leg - Anterior  Clobetasol daily after bathing for 4 to 6 weeks.  Discussed ways to minimize venous pressure.  Related Medications clobetasol ointment (TEMOVATE) 0.05 % APPLY TO AFFECTED AREAS ON BOTH LEGS TWICE DAILY AFTER SHOWER      I, Lavonna Monarch, MD, have reviewed all documentation for this visit.  The documentation on 12/25/21 for the exam, diagnosis, procedures, and orders are all accurate and complete.

## 2021-12-28 ENCOUNTER — Ambulatory Visit: Payer: Medicare Other | Admitting: Dermatology

## 2021-12-28 ENCOUNTER — Other Ambulatory Visit: Payer: Self-pay

## 2021-12-28 DIAGNOSIS — Z85828 Personal history of other malignant neoplasm of skin: Secondary | ICD-10-CM

## 2021-12-28 DIAGNOSIS — C44329 Squamous cell carcinoma of skin of other parts of face: Secondary | ICD-10-CM | POA: Diagnosis not present

## 2021-12-28 DIAGNOSIS — L570 Actinic keratosis: Secondary | ICD-10-CM

## 2021-12-28 DIAGNOSIS — Z8589 Personal history of malignant neoplasm of other organs and systems: Secondary | ICD-10-CM

## 2021-12-28 DIAGNOSIS — I872 Venous insufficiency (chronic) (peripheral): Secondary | ICD-10-CM | POA: Diagnosis not present

## 2021-12-28 NOTE — Patient Instructions (Signed)

## 2022-01-04 ENCOUNTER — Other Ambulatory Visit: Payer: Self-pay | Admitting: Family Medicine

## 2022-01-06 ENCOUNTER — Ambulatory Visit: Payer: Medicare Other | Admitting: Dermatology

## 2022-01-16 ENCOUNTER — Encounter: Payer: Self-pay | Admitting: Dermatology

## 2022-01-16 NOTE — Progress Notes (Signed)
Follow-Up Visit   Subjective  Todd Gonzales is a 81 y.o. male who presents for the following: Skin Problem (Pt here to have a spot on his R Cheek Evaluated and some spots on the face that need freezing ).  Nonhealing crusts on the left cheek, recheck legs Location:  Duration:  Quality:  Associated Signs/Symptoms: Modifying Factors:  Severity:  Timing: Context:   Objective  Well appearing patient in no apparent distress; mood and affect are within normal limits. Left Lower Leg - Anterior, Right Lower Leg - Anterior Decreased lower extremity swelling and dermatitis.  Right Buccal Cheek (2), Right Malar Cheek Three 4 mm gritty pink crusts  Right Lower Leg - Anterior Still 5 mm pink crust at site of carcinoma.       Left Buccal Cheek 7 mm waxy pink papule       Left Jawline 8 mm centrally eroded waxy papule         A focused examination was performed including head, neck, legs. Relevant physical exam findings are noted in the Assessment and Plan.   Assessment & Plan    Chronic venous stasis dermatitis of both lower extremities Left Lower Leg - Anterior; Right Lower Leg - Anterior  Continue with measures to minimize venous pressure.  Actinic keratosis (3) Right Malar Cheek; Right Buccal Cheek (2)  Destruction of lesion - Right Buccal Cheek, Right Malar Cheek Complexity: simple   Destruction method: cryotherapy   Informed consent: discussed and consent obtained   Lesion destroyed using liquid nitrogen: Yes   Cryotherapy cycles:  3 Outcome: patient tolerated procedure well with no complications    History of squamous cell carcinoma Right Lower Leg - Anterior  Defer further surgery in the face of chronic venous insufficiency.  May consider future use of [place].  Squamous cell carcinoma of skin of left cheek (2) Left Buccal Cheek  Skin / nail biopsy Type of biopsy: tangential   Informed consent: discussed and consent obtained   Timeout:  patient name, date of birth, surgical site, and procedure verified   Anesthesia: the lesion was anesthetized in a standard fashion   Anesthetic:  1% lidocaine w/ epinephrine 1-100,000 local infiltration Instrument used: flexible razor blade   Hemostasis achieved with: aluminum chloride and electrodesiccation   Outcome: patient tolerated procedure well   Post-procedure details: wound care instructions given    Destruction of lesion Complexity: simple   Destruction method: electrodesiccation and curettage   Informed consent: discussed and consent obtained   Timeout:  patient name, date of birth, surgical site, and procedure verified Anesthesia: the lesion was anesthetized in a standard fashion   Anesthetic:  1% lidocaine w/ epinephrine 1-100,000 local infiltration Curettage performed in three different directions: Yes   Electrodesiccation performed over the curetted area: Yes   Curettage cycles:  3 Lesion length (cm):  0.7 Lesion width (cm):  0.7 Margin per side (cm):  0 Final wound size (cm):  0.7 Hemostasis achieved with:  aluminum chloride Outcome: patient tolerated procedure well with no complications   Post-procedure details: wound care instructions given   Additional details:  TXPBX  Specimen 1 - Surgical pathology Differential Diagnosis: R/O BCC VS SCC - TXPBX  Check Margins: No  Left Jawline  Skin / nail biopsy Type of biopsy: tangential   Informed consent: discussed and consent obtained   Timeout: patient name, date of birth, surgical site, and procedure verified   Anesthesia: the lesion was anesthetized in a standard fashion   Anesthetic:  1% lidocaine w/ epinephrine 1-100,000 local infiltration Instrument used: flexible razor blade   Hemostasis achieved with: aluminum chloride and electrodesiccation   Outcome: patient tolerated procedure well   Post-procedure details: wound care instructions given    Destruction of lesion Complexity: simple   Destruction  method: electrodesiccation and curettage   Informed consent: discussed and consent obtained   Timeout:  patient name, date of birth, surgical site, and procedure verified Anesthesia: the lesion was anesthetized in a standard fashion   Anesthetic:  1% lidocaine w/ epinephrine 1-100,000 local infiltration Curettage performed in three different directions: Yes   Curettage cycles:  3 Lesion length (cm):  0.9 Lesion width (cm):  0.9 Margin per side (cm):  0 Final wound size (cm):  0.9 Hemostasis achieved with:  aluminum chloride Outcome: patient tolerated procedure well with no complications   Post-procedure details: wound care instructions given   Additional details:  TXPBX  Specimen 2 - Surgical pathology Differential Diagnosis: R/O BCC VS SCC - TXPBX  Check Margins: No  After shave biopsy each lesion was curetted and cauterized      I, Lavonna Monarch, MD, have reviewed all documentation for this visit.  The documentation on 01/16/22 for the exam, diagnosis, procedures, and orders are all accurate and complete.

## 2022-01-20 DIAGNOSIS — M170 Bilateral primary osteoarthritis of knee: Secondary | ICD-10-CM | POA: Diagnosis not present

## 2022-02-01 ENCOUNTER — Other Ambulatory Visit: Payer: Self-pay | Admitting: Family Medicine

## 2022-02-03 ENCOUNTER — Telehealth: Payer: Self-pay

## 2022-02-03 NOTE — Telephone Encounter (Signed)
Patient was seen by you in the past then changed to Todd Gonzales. Wanted to know if he could change back to [name] for PCP care.   ?

## 2022-02-04 ENCOUNTER — Other Ambulatory Visit: Payer: Self-pay | Admitting: Family Medicine

## 2022-02-07 NOTE — Telephone Encounter (Signed)
Okay with me, can set up this summer.  If urgent issues or issues that need to be addressed in the next few months then he needs to follow-up with his current PCP. ?

## 2022-02-07 NOTE — Telephone Encounter (Signed)
Patient has TOC appt on 02/14/22 and needs rx refilled ?

## 2022-02-08 NOTE — Telephone Encounter (Signed)
Sent. Thanks.   

## 2022-02-14 ENCOUNTER — Other Ambulatory Visit: Payer: Self-pay

## 2022-02-14 ENCOUNTER — Encounter: Payer: Self-pay | Admitting: Family Medicine

## 2022-02-14 ENCOUNTER — Ambulatory Visit (INDEPENDENT_AMBULATORY_CARE_PROVIDER_SITE_OTHER): Payer: Medicare Other | Admitting: Family Medicine

## 2022-02-14 VITALS — BP 152/80 | HR 86 | Temp 97.1°F | Ht 70.0 in | Wt 246.0 lb

## 2022-02-14 DIAGNOSIS — I10 Essential (primary) hypertension: Secondary | ICD-10-CM | POA: Diagnosis not present

## 2022-02-14 DIAGNOSIS — R2689 Other abnormalities of gait and mobility: Secondary | ICD-10-CM | POA: Diagnosis not present

## 2022-02-14 DIAGNOSIS — Z7189 Other specified counseling: Secondary | ICD-10-CM | POA: Diagnosis not present

## 2022-02-14 DIAGNOSIS — E559 Vitamin D deficiency, unspecified: Secondary | ICD-10-CM

## 2022-02-14 NOTE — Progress Notes (Unsigned)
This visit occurred during the SARS-CoV-2 public health emergency.  Safety protocols were in place, including screening questions prior to the visit, additional usage of staff PPE, and extensive cleaning of exam room while observing appropriate contact time as indicated for disinfecting solutions.  Balance changes for the last 6 months.  H/o laminectomy.  Still on gabapentin at baseline, for pain at night to help with sleep.  He has to push off the chair with his hands to get up.  "I just can't take off like I used to."  No vertigo.  No falls.  He doesn't feel lightheaded.  He can still feel the floor.  He doesn't have focal weakness but his knees feel weak at baseline, likely from OA.  He had prev injections at [facility] ortho at baseline, instead of knee replacement.    Normal S/S BLE  CN 2-12 wnl B, S/S wnl x4 No tremor.     Advanced directive discussed with patient. Wife designated if patient were incapacitated.

## 2022-02-14 NOTE — Patient Instructions (Signed)
Go to the lab on the way out.   If you have mychart we'll likely use that to update you.    ?Take care.  Glad to see you. ?Assuming your labs are fine, then it likely makes sense to set you up with PT.   ?

## 2022-02-15 LAB — CBC WITH DIFFERENTIAL/PLATELET
Basophils Absolute: 0.1 10*3/uL (ref 0.0–0.1)
Basophils Relative: 1 % (ref 0.0–3.0)
Eosinophils Absolute: 0.1 10*3/uL (ref 0.0–0.7)
Eosinophils Relative: 1.2 % (ref 0.0–5.0)
HCT: 43 % (ref 39.0–52.0)
Hemoglobin: 14.2 g/dL (ref 13.0–17.0)
Lymphocytes Relative: 18.5 % (ref 12.0–46.0)
Lymphs Abs: 1.2 10*3/uL (ref 0.7–4.0)
MCHC: 33 g/dL (ref 30.0–36.0)
MCV: 98 fl (ref 78.0–100.0)
Monocytes Absolute: 0.6 10*3/uL (ref 0.1–1.0)
Monocytes Relative: 9.9 % (ref 3.0–12.0)
Neutro Abs: 4.5 10*3/uL (ref 1.4–7.7)
Neutrophils Relative %: 69.4 % (ref 43.0–77.0)
Platelets: 208 10*3/uL (ref 150.0–400.0)
RBC: 4.39 Mil/uL (ref 4.22–5.81)
RDW: 13.6 % (ref 11.5–15.5)
WBC: 6.5 10*3/uL (ref 4.0–10.5)

## 2022-02-15 LAB — COMPREHENSIVE METABOLIC PANEL
ALT: 13 U/L (ref 0–53)
AST: 13 U/L (ref 0–37)
Albumin: 4.1 g/dL (ref 3.5–5.2)
Alkaline Phosphatase: 44 U/L (ref 39–117)
BUN: 18 mg/dL (ref 6–23)
CO2: 31 mEq/L (ref 19–32)
Calcium: 9.1 mg/dL (ref 8.4–10.5)
Chloride: 104 mEq/L (ref 96–112)
Creatinine, Ser: 0.81 mg/dL (ref 0.40–1.50)
GFR: 81.79 mL/min (ref >60.00)
Glucose, Bld: 90 mg/dL (ref 70–99)
Potassium: 4.6 mEq/L (ref 3.5–5.1)
Sodium: 142 mEq/L (ref 135–145)
Total Bilirubin: 0.9 mg/dL (ref 0.2–1.2)
Total Protein: 6.1 g/dL (ref 6.0–8.3)

## 2022-02-15 LAB — LIPID PANEL
Cholesterol: 168 mg/dL (ref 0–200)
HDL: 49.3 mg/dL (ref >39.00)
LDL Cholesterol: 84 mg/dL (ref 0–99)
NonHDL: 118.53
Total CHOL/HDL Ratio: 3
Triglycerides: 171 mg/dL — ABNORMAL HIGH (ref 0.0–149.0)
VLDL: 34.2 mg/dL (ref 0.0–40.0)

## 2022-02-15 LAB — MAGNESIUM: Magnesium: 2.2 mg/dL (ref 1.5–2.5)

## 2022-02-15 LAB — VITAMIN D 25 HYDROXY (VIT D DEFICIENCY, FRACTURES): VITD: 30.36 ng/mL (ref 30.00–100.00)

## 2022-02-16 DIAGNOSIS — R2689 Other abnormalities of gait and mobility: Secondary | ICD-10-CM | POA: Insufficient documentation

## 2022-02-16 NOTE — Assessment & Plan Note (Signed)
He does not have typical neuropathy symptoms.  He can still feel the floor and he does not have an obvious motor deficit.  Unclear if his symptoms are related to knee arthritis.  See notes on labs.  If his labs are unremarkable then I think it makes sense to get evaluated by physical therapy.  Fall cautions discussed with patient. ?

## 2022-02-16 NOTE — Assessment & Plan Note (Signed)
Wife designated if patient were incapacitated.  

## 2022-02-16 NOTE — Assessment & Plan Note (Signed)
Continue benazepril hydrochlorothiazide.  See notes on labs. ?

## 2022-02-21 ENCOUNTER — Other Ambulatory Visit: Payer: Self-pay | Admitting: Family Medicine

## 2022-02-21 NOTE — Telephone Encounter (Signed)
Refill request for GABAPENTIN 300 MG CAP ? ?LOV - 02/14/22 ?Next OV - not scheduled ?Last refill - 08/18/21 #90/1 ? ?

## 2022-03-03 ENCOUNTER — Telehealth: Payer: Self-pay | Admitting: Family Medicine

## 2022-03-03 DIAGNOSIS — R2689 Other abnormalities of gait and mobility: Secondary | ICD-10-CM

## 2022-03-03 NOTE — Telephone Encounter (Signed)
Pt wife called asking if she can get a referral for pt for PT at [facility]. Pt wife states the phone number is [phone] and fax is [phone]. Please advise. ?

## 2022-03-06 NOTE — Telephone Encounter (Signed)
Please let him know that I put in the referral and he should get a call about scheduling.  Thanks. ?

## 2022-03-07 NOTE — Telephone Encounter (Signed)
Wife notified referral was done ?

## 2022-03-20 DIAGNOSIS — M48061 Spinal stenosis, lumbar region without neurogenic claudication: Secondary | ICD-10-CM | POA: Diagnosis not present

## 2022-03-20 DIAGNOSIS — Z96643 Presence of artificial hip joint, bilateral: Secondary | ICD-10-CM | POA: Diagnosis not present

## 2022-03-20 DIAGNOSIS — E785 Hyperlipidemia, unspecified: Secondary | ICD-10-CM | POA: Diagnosis not present

## 2022-03-20 DIAGNOSIS — E559 Vitamin D deficiency, unspecified: Secondary | ICD-10-CM | POA: Diagnosis not present

## 2022-03-20 DIAGNOSIS — I10 Essential (primary) hypertension: Secondary | ICD-10-CM | POA: Diagnosis not present

## 2022-03-20 DIAGNOSIS — R351 Nocturia: Secondary | ICD-10-CM | POA: Diagnosis not present

## 2022-03-20 DIAGNOSIS — N401 Enlarged prostate with lower urinary tract symptoms: Secondary | ICD-10-CM | POA: Diagnosis not present

## 2022-03-20 DIAGNOSIS — Z9181 History of falling: Secondary | ICD-10-CM | POA: Diagnosis not present

## 2022-03-20 DIAGNOSIS — R159 Full incontinence of feces: Secondary | ICD-10-CM | POA: Diagnosis not present

## 2022-03-20 DIAGNOSIS — Z85828 Personal history of other malignant neoplasm of skin: Secondary | ICD-10-CM | POA: Diagnosis not present

## 2022-03-20 DIAGNOSIS — M5412 Radiculopathy, cervical region: Secondary | ICD-10-CM | POA: Diagnosis not present

## 2022-03-20 DIAGNOSIS — M170 Bilateral primary osteoarthritis of knee: Secondary | ICD-10-CM | POA: Diagnosis not present

## 2022-03-20 DIAGNOSIS — G629 Polyneuropathy, unspecified: Secondary | ICD-10-CM | POA: Diagnosis not present

## 2022-03-20 DIAGNOSIS — G959 Disease of spinal cord, unspecified: Secondary | ICD-10-CM | POA: Diagnosis not present

## 2022-03-28 ENCOUNTER — Ambulatory Visit: Payer: Medicare Other | Admitting: Dermatology

## 2022-03-28 ENCOUNTER — Encounter: Payer: Self-pay | Admitting: Dermatology

## 2022-03-28 DIAGNOSIS — L720 Epidermal cyst: Secondary | ICD-10-CM | POA: Diagnosis not present

## 2022-03-28 MED ORDER — TRIAMCINOLONE ACETONIDE 10 MG/ML IJ SUSP
10.0000 mg | Freq: Once | INTRAMUSCULAR | Status: AC
  Administered 2022-03-28: 10 mg

## 2022-04-05 ENCOUNTER — Ambulatory Visit: Payer: Medicare Other | Admitting: Family Medicine

## 2022-04-05 ENCOUNTER — Encounter: Payer: Self-pay | Admitting: Family Medicine

## 2022-04-05 VITALS — BP 126/72 | HR 76 | Temp 98.5°F | Ht 70.0 in | Wt 251.2 lb

## 2022-04-05 DIAGNOSIS — R350 Frequency of micturition: Secondary | ICD-10-CM | POA: Insufficient documentation

## 2022-04-05 DIAGNOSIS — R8290 Unspecified abnormal findings in urine: Secondary | ICD-10-CM | POA: Diagnosis not present

## 2022-04-05 DIAGNOSIS — R300 Dysuria: Secondary | ICD-10-CM

## 2022-04-05 LAB — POC URINALSYSI DIPSTICK (AUTOMATED)
Bilirubin, UA: 2
Blood, UA: NEGATIVE
Glucose, UA: NEGATIVE
Ketones, UA: NEGATIVE
Nitrite, UA: NEGATIVE
Protein, UA: POSITIVE — AB
Spec Grav, UA: 1.025 (ref 1.010–1.025)
Urobilinogen, UA: 0.2 E.U./dL
pH, UA: 6 (ref 5.0–8.0)

## 2022-04-05 NOTE — Assessment & Plan Note (Signed)
Protein and trace leukocytes ?Urine cx sent  ? ?His previous urinary symptoms are better  ?

## 2022-04-05 NOTE — Assessment & Plan Note (Addendum)
Episode of urinary frequency after a flare of IBS last week with fatigue   (past h/o BPH on flomax and very distant prostatitis in his 24s that was eventually ruled out as not)  ?Now, symptoms are resolved  ?Cannot give sample now, will bring one back to the office ? ?Exam is normal  ?Pending ua ?Addendum: ua with trace leuk and protein, culture is pending  ?

## 2022-04-05 NOTE — Progress Notes (Signed)
? ?Subjective:  ? ? Patient ID: Todd Gonzales, male    DOB: February 04, 1941, 81 y.o.   MRN: [id number] ? ?HPI ?81 yo pt of Dr Damita Dunnings presents for urinary symptoms  ? ?Wt Readings from Last 3 Encounters:  ?04/05/22 251 lb 4 oz (114 kg)  ?02/14/22 246 lb (111.6 kg)  ?10/26/21 238 lb 1.6 oz (108 kg)  ? ?36.05 kg/m? ? ?He has h/o BPH and lumbar spinal stenosis with past laminectomy and HTN ?Also uti in past req admission  ? ? ?Started with symptoms of IBS a week ago / urgent stools  ?That was on and off  ?Then started frequent urination  ?Got up 6 times to urinate  ?No change in flow and no blood or pain  ?No change in flow or stream  ? ?He drank cream of tartar with fluid-heard that will knock out a uti  ?He tried it once  ?Tasted awful  ? ?Awful / tired , could not stay awake  ?Worn out  ?No fever  ?Could not do his PT/exercise yesterday  ? ?No burning to urinate  ?Had a little pain in low abd/pelvis  ?Does not feel like he needs to urinate  ? ?Urination is back to normal now  ?Just feels a little bloated  ?Otherwise feels much better today ? ?Distant past- had bad uti, hosp  ?No pyelo  ?That is when they put him on flomax  ? ?Takes tamsulosin 0.4 mg daily -no missed doses  ? ?Missed a wedding because he was running to the bathroom all night  ? ? ?Lab Results  ?Component Value Date  ? PSA1 3.005 08/25/2016  ? PSA 2.935 09/17/2019  ? ?Lab Results  ?Component Value Date  ? HGBA1C 5.2 03/26/2020  ? ? ? ?Unable to give urine sample here (denies urinary retention)  ?Returned with one later in the day  ? ?Results for orders placed or performed in visit on 04/05/22  ?POCT Urinalysis Dipstick (Automated)  ?Result Value Ref Range  ? Color, UA Yellow   ? Clarity, UA Clear   ? Glucose, UA Negative Negative  ? Bilirubin, UA 2 mg/dL   ? Ketones, UA Negative   ? Spec Grav, UA 1.025 1.010 - 1.025  ? Blood, UA Negative   ? pH, UA 6.0 5.0 - 8.0  ? Protein, UA Positive (A) Negative  ? Urobilinogen, UA 0.2 0.2 or 1.0 E.U./dL  ? Nitrite, UA  Negative   ? Leukocytes, UA Trace (A) Negative  ?  ? ? ?Patient Active Problem List  ? Diagnosis Date Noted  ? Frequent urination 04/05/2022  ? Balance disorder 02/16/2022  ? COVID 06/29/2021  ? Thumb pain 03/31/2020  ? Muscle cramp 03/31/2020  ? Cervical radiculopathy 12/13/2019  ? Right arm pain 10/10/2019  ? Vitamin D deficiency 10/10/2019  ? Incontinence of feces   ? Post-op pain   ? Hyperglycemia   ? Status post laminectomy   ? Postoperative pain after spinal surgery 11/30/2018  ? Myelopathy concurrent with and due to stenosis of lumbar spine ([place]) 11/20/2018  ? Microalbuminuria 09/02/2016  ? HLD (hyperlipidemia) 09/02/2016  ? Spinal stenosis   ? Other fatigue 08/24/2016  ? HTN (hypertension) 08/24/2016  ? BPH (benign prostatic hyperplasia) 08/24/2016  ? Nocturia 08/24/2016  ? Advance care planning 08/24/2016  ? Osteoarthritis 10/28/2014  ? SCC (squamous cell carcinoma), face 09/25/2014  ? S/P Left THA, AA 03/20/2012  ? ?Past Medical History:  ?Diagnosis Date  ? Arthritis   ?  BPH (benign prostatic hyperplasia)   ? nocturia x4 at baseline  ? H/O hiatal hernia   ? Hemorrhoids   ? Hyperlipidemia   ? Hypertension   ? Dr Osborne Casco- LOV with clearance 3/13 on chart  ? IGT (impaired glucose tolerance)   ? last AIC 5.5- diet controlled- per office note 3/13  ? SCC (squamous cell carcinoma) 02/10/2005  ? scc in situ-left forearm.  ? SCCA (squamous cell carcinoma) of skin 02/10/2005  ? Right Outer Eye (in situ) (curet and excision)  ? SCCA (squamous cell carcinoma) of skin 02/13/2006  ? Right Ear (well diff) (curet and 5FU)  ? SCCA (squamous cell carcinoma) of skin 04/05/2006  ? Left Ear Bowl (in situ) (tx p bx)  ? SCCA (squamous cell carcinoma) of skin 07/06/2010  ? Right Cheek (in situ) (curet and zyclara)  ? SCCA (squamous cell carcinoma) of skin 04/27/2009  ? Left Upper Forearm (in situ) (curet 5FU)  ? SCCA (squamous cell carcinoma) of skin 08/28/2014  ? Right Cheek (well diff)  ? SCCA (squamous cell carcinoma) of skin  03/21/2016  ? Left Jawline Ant (well diff) (curet and 5FU)  ? SCCA (squamous cell carcinoma) of skin 01/19/2017  ? Right Hand (in situ) (tx p bx)  ? SCCA (squamous cell carcinoma) of skin 03/04/1840  ? Left Jawline Inf (in situ) (curet and 5FU)  ? SCCA (squamous cell carcinoma) of skin 12/23/2020  ? Left Forehead (well diff) (tx p bx)  ? SCCA (squamous cell carcinoma) of skin 12/23/2020  ? Left Submandibular Area (in situ) (tx p bx)  ? Seasonal allergies   ? Spinal stenosis   ? Squamous cell carcinoma of skin 08/31/2020  ? in situ-left jawline cheek  ? Squamous cell carcinoma of skin 04/27/2021  ? infil- left nasal sidewall(MOHS)  ? Squamous cell carcinoma of skin of face 04/27/2021  ? infil- Left Temporal scalp (MOHS  ? UTI (lower urinary tract infection)   ? required admission to [facility]  ? ?Past Surgical History:  ?Procedure Laterality Date  ? BACK SURGERY  2008  ? Laminectomy L4-5  ? JOINT REPLACEMENT Bilateral   ? bil  ? KNEE ARTHROSCOPY    ? right  ? LUMBAR LAMINECTOMY/DECOMPRESSION MICRODISCECTOMY Bilateral 11/20/2018  ? Procedure: Bilateral Thoracic Twelve to Lumbar One Laminectomy;  Surgeon: Kristeen Miss, MD;  Location: [place];  Service: Neurosurgery;  Laterality: Bilateral;  posterior  ? TOTAL HIP ARTHROPLASTY  03/20/2012  ? Procedure: TOTAL HIP ARTHROPLASTY ANTERIOR APPROACH;  Surgeon: Mauri Pole, MD;  Location: [facility];  Service: Orthopedics;  Laterality: Left;  ? ?Social History  ? ?Tobacco Use  ? Smoking status: Never  ? Smokeless tobacco: Never  ?Vaping Use  ? Vaping Use: Never used  ?Substance Use Topics  ? Alcohol use: Yes  ?  Comment: socially, wine  ? Drug use: No  ? ?Family History  ?Problem Relation Age of Onset  ? Arthritis Mother   ? Lung cancer Father   ? Asthma Father   ? Colon cancer Neg Hx   ? Prostate cancer Neg Hx   ? ?Allergies  ?Allergen Reactions  ? Codeine Other (See Comments)  ?  Unknown reaction  ? Sulfamethoxazole-Trimethoprim   ?  Other reaction(s): light rash over face/chest   ? ?Current Outpatient Medications on File Prior to Visit  ?Medication Sig Dispense Refill  ? acetaminophen (TYLENOL) 325 MG tablet Take 650 mg by mouth every 6 (six) hours as needed.    ? benazepril-hydrochlorthiazide (LOTENSIN HCT)  20-12.5 MG tablet Take one half tablet by mouth daily.  Please make an appointment for physical exam prior to further refills. 45 tablet 0  ? celecoxib (CELEBREX) 200 MG capsule TAKE 1 CAPSULE BY MOUTH TWICE DAILY 180 capsule 0  ? Cholecalciferol (VITAMIN D-1000 MAX ST) 25 MCG (1000 UT) tablet Take 2 tablets (2,000 Units total) by mouth daily.    ? clobetasol ointment (TEMOVATE) 0.05 % APPLY TO AFFECTED AREAS ON BOTH LEGS TWICE DAILY AFTER SHOWER 30 g 2  ? diclofenac Sodium (VOLTAREN) 1 % GEL Apply to affected joints qid prn    ? gabapentin (NEURONTIN) 300 MG capsule TAKE 1 CAPSULE BY MOUTH ONCE DAILY 90 capsule 1  ? oxymetazoline (AFRIN) 0.05 % nasal spray Place 2 sprays into the nose at bedtime as needed for congestion (allergies).     ? sodium chloride (OCEAN) 0.65 % nasal spray Place 1 spray into the nose daily as needed for congestion.     ? tamsulosin (FLOMAX) 0.4 MG CAPS capsule TAKE 1 CAPSULE BY MOUTH ONCE DAILY 90 capsule 1  ? ?No current facility-administered medications on file prior to visit.  ?  ? ? ?Review of Systems  ?Constitutional:  Negative for activity change, appetite change, fatigue, fever and unexpected weight change.  ?     Fatigue is better  ?HENT:  Negative for congestion, rhinorrhea, sore throat and trouble swallowing.   ?Eyes:  Negative for pain, redness, itching and visual disturbance.  ?Respiratory:  Negative for cough, chest tightness, shortness of breath and wheezing.   ?Cardiovascular:  Negative for chest pain and palpitations.  ?Gastrointestinal:  Negative for abdominal pain, blood in stool, constipation, diarrhea and nausea.  ?Endocrine: Negative for cold intolerance, heat intolerance, polydipsia and polyuria.  ?Genitourinary:  Negative for decreased  urine volume, difficulty urinating, dysuria, frequency, hematuria, testicular pain and urgency.  ?     No more urinary symptoms  ? ?No frequency  ?Cannot give sample right now, needs to drink more water ? ?N

## 2022-04-05 NOTE — Patient Instructions (Addendum)
Drink lot of water  ? ?Avoid a lot of caffeine or acidic beverages and tea - they can irritate your bladder (for now)  ?If cranberry juice makes you burn or causes bladder pain then stop it  ? ?Let's check a urine sample ? ?I'm glad you are feeling better  ? ?If your symptoms return or if you develop a fever or blood in urine let us know right away  ? ? ? ? ?

## 2022-04-06 ENCOUNTER — Encounter: Payer: Self-pay | Admitting: Family Medicine

## 2022-04-06 LAB — URINE CULTURE
MICRO NUMBER:: 13340043
Result:: NO GROWTH
SPECIMEN QUALITY:: ADEQUATE

## 2022-04-07 ENCOUNTER — Other Ambulatory Visit: Payer: Self-pay | Admitting: Family Medicine

## 2022-04-07 MED ORDER — HYDROCORTISONE (PERIANAL) 2.5 % EX CREA: 1.0000 "application " | TOPICAL_CREAM | Freq: Two times a day (BID) | CUTANEOUS | 1 refills | Status: DC

## 2022-04-12 ENCOUNTER — Ambulatory Visit (INDEPENDENT_AMBULATORY_CARE_PROVIDER_SITE_OTHER): Payer: Medicare Other | Admitting: Family Medicine

## 2022-04-12 ENCOUNTER — Telehealth: Payer: Self-pay | Admitting: Family Medicine

## 2022-04-12 ENCOUNTER — Encounter: Payer: Self-pay | Admitting: Family Medicine

## 2022-04-12 VITALS — BP 128/78 | HR 76 | Temp 98.2°F | Ht 70.0 in | Wt 249.0 lb

## 2022-04-12 DIAGNOSIS — R300 Dysuria: Secondary | ICD-10-CM | POA: Diagnosis not present

## 2022-04-12 DIAGNOSIS — Z125 Encounter for screening for malignant neoplasm of prostate: Secondary | ICD-10-CM | POA: Diagnosis not present

## 2022-04-12 LAB — PSA, MEDICARE: PSA: 3.98 ng/ml (ref 0.10–4.00)

## 2022-04-12 MED ORDER — TAMSULOSIN HCL 0.4 MG PO CAPS: 0.8000 mg | ORAL_CAPSULE | Freq: Every day | ORAL | Status: DC

## 2022-04-12 MED ORDER — BENAZEPRIL HCL 10 MG PO TABS: 10.0000 mg | ORAL_TABLET | Freq: Every day | ORAL | 3 refills | Status: DC

## 2022-04-12 NOTE — Patient Instructions (Addendum)
Go to the lab on the way out.   If you have mychart we'll likely use that to update you.    ?Try taking flomax 2 tabs a day.   ?If not any better after a few days, then let me know.   ?We may need to change the BP medicine to plain benazepril without HCTZ.  Don't make that change yet.  Don't take a double dose of benazepril.   ?Take care.  Glad to see you. ?

## 2022-04-12 NOTE — Progress Notes (Signed)
He had GI irregularity.  Then urinary sx.  Saw Dr. Glori Bickers.  No burning with urination.  Stream is still good but has frequency.  No fevers, no chills.  Some nausea occ.  He has been on flomax at baseline and had been on prior to recent sx. previous labs discussed with patient. ? ?He isn't lightheaded on standing.   ? ?Meds, vitals, and allergies reviewed.  ? ?ROS: Per HPI unless specifically indicated in ROS section  ? ?GEN: nad, alert and oriented ?HEENT: ncat ?NECK: supple w/o LA ?CV: rrr.  ?PULM: ctab, no inc wob ?ABD: soft, +bs ?EXT: trace BLE edema ?SKIN: well perfused.  ?

## 2022-04-12 NOTE — Telephone Encounter (Signed)
Lidia from [facility] called about pt's paperwork. Stated it was faxed to [place] on 4.16.2023 and they have not gotten or heard anything back from [place].  ? ?Callback Number: [phone] ?

## 2022-04-13 ENCOUNTER — Telehealth: Payer: Self-pay | Admitting: Family Medicine

## 2022-04-13 DIAGNOSIS — R300 Dysuria: Secondary | ICD-10-CM | POA: Insufficient documentation

## 2022-04-13 MED ORDER — TAMSULOSIN HCL 0.4 MG PO CAPS: 0.8000 mg | ORAL_CAPSULE | Freq: Every day | ORAL | 3 refills | Status: DC

## 2022-04-13 NOTE — Telephone Encounter (Signed)
Original order was faxed back after it was signed. Tried to call [facility] back but there was no answer. I will give [name] the new order that was faxed back over to [place] to resign and fax back once done. ?

## 2022-04-13 NOTE — Telephone Encounter (Signed)
Called and spoke with patient about message from [name]. Patient verbalized understanding and is doing okay right now but will call back if anything changes.  ?

## 2022-04-13 NOTE — Telephone Encounter (Signed)
I would not start an antibiotic yet unless he had a fever or clearly escalating symptoms.  If that is the case then please let me know.  Otherwise I would try the increased dose of Flomax first  I sent a new prescription for that.  If that does not work then I would try switching to plain benazepril without hydrochlorothiazide. ? ?If he is worsening at any point in the meantime then let me know but I would not start an antibiotic yet.  If he had acute prostatitis I would expect his PSA to be much higher.  He only has a minimal increase in his PSA compared to previous baseline. ?

## 2022-04-13 NOTE — Telephone Encounter (Signed)
?  Patient saw MD on 5.9.23. based on lab results of his PSA, he said he needs an antibiotic ? ?Please call to: ?[facility] - [place], [place] Phone:  [phone]  ?Fax:  [phone]  ?  ? ?Also, patient wanted to make sure the updated flomax rx  increasing to two per day was sent to them as well ? ?Please follow-up with the patient  ?

## 2022-04-13 NOTE — Assessment & Plan Note (Signed)
Less likely to be prostatitis but reasonable to check PSA.  In the absence of significant PSA elevation, I would not suspect prostatitis ?Reasonable to try taking flomax 2 tabs a day.  Routine cautions given to patient. ?If not any better after a few days, then he can let me know.   ?He may need to change benazepril hydrochlorothiazide to plain benazepril without HCTZ.  I asked him not to make that change yet.  I would see how he does with the higher dose of Flomax.  He understood not to take a double dose of benazepril. ?

## 2022-04-14 NOTE — Telephone Encounter (Signed)
Orders have been faxed back again this am. ?

## 2022-04-15 ENCOUNTER — Encounter: Payer: Self-pay | Admitting: Dermatology

## 2022-04-15 NOTE — Progress Notes (Signed)
? ?  Follow-Up Visit ?  ?Subjective  ?Todd Gonzales is a 81 y.o. male who presents for the following: Follow-up (New lesion on crown of scalp-). ? ? ?Lesion on scalp, uncertain if there was under the skin bump there ?Location:  ?Duration:  ?Quality:  ?Associated Signs/Symptoms: ?Modifying Factors:  ?Severity:  ?Timing: ?Context:  ? ?Objective  ?Well appearing patient in no apparent distress; mood and affect are within normal limits. ?Mid Parietal Scalp ?1 cm inflamed deep dermal to subcutaneous nodule, nonfluctuant.  Presumed inflamed cyst. ? ? ? ?A focused examination was performed including head and neck.. Relevant physical exam findings are noted in the Assessment and Plan. ? ? ?Assessment & Plan  ? ? ?Epidermal cyst ?Mid Parietal Scalp ? ?If no improvement with 1-2 injections will schedule surgery. ? ?triamcinolone acetonide (KENALOG) 10 MG/ML injection 10 mg - Mid Parietal Scalp ? ? ?Intralesional injection - Mid Parietal Scalp ?Location: scalp ? ?Informed Consent: Discussed risks (infection, pain, bleeding, bruising, thinning of the skin, loss of skin pigment, lack of resolution, and recurrence of lesion) and benefits of the procedure, as well as the alternatives. Informed consent was obtained. ?Preparation: The area was prepared a standard fashion. ? ?Procedure Details: An intralesional injection was performed with Kenalog 10 mg/cc. 0.1 cc in total were injected. ? ?Total number of injections: 2 ? ?Plan: The patient was instructed on post-op care. Recommend OTC analgesia as needed for pain. ? ? ? ? ? ? ?I, Lavonna Monarch, MD, have reviewed all documentation for this visit.  The documentation on 04/15/22 for the exam, diagnosis, procedures, and orders are all accurate and complete. ?

## 2022-04-18 ENCOUNTER — Encounter: Payer: Self-pay | Admitting: Family Medicine

## 2022-04-20 ENCOUNTER — Ambulatory Visit (INDEPENDENT_AMBULATORY_CARE_PROVIDER_SITE_OTHER): Payer: Medicare Other | Admitting: Family Medicine

## 2022-04-20 ENCOUNTER — Encounter: Payer: Self-pay | Admitting: Family Medicine

## 2022-04-20 VITALS — BP 120/80 | HR 68 | Temp 98.4°F | Ht 70.0 in | Wt 250.2 lb

## 2022-04-20 DIAGNOSIS — R0989 Other specified symptoms and signs involving the circulatory and respiratory systems: Secondary | ICD-10-CM

## 2022-04-20 DIAGNOSIS — R102 Pelvic and perineal pain: Secondary | ICD-10-CM | POA: Diagnosis not present

## 2022-04-20 DIAGNOSIS — N3941 Urge incontinence: Secondary | ICD-10-CM

## 2022-04-20 MED ORDER — LEVOFLOXACIN 500 MG PO TABS: 500.0000 mg | ORAL_TABLET | Freq: Every day | ORAL | 0 refills | Status: DC

## 2022-04-20 NOTE — Progress Notes (Signed)
Todd Oyama T. Devinn Hurwitz, MD, [place] at [facility] [place], [address]  Phone: [phone]  FAX: [place] - 81 y.o. male  MRN [id number]  Date of Birth: 1941/08/08  Date: 04/20/2022  PCP: Tonia Ghent, MD  Referral: Tonia Ghent, MD  Chief Complaint  Patient presents with   Nasal Congestion    Negative home Covid test yesterday    Fatigue   Abdominal Pain    Seen by [name] 5/9 and Dr. Glori Bickers 5/2   Nausea    After eating    Joint Swelling    Ankles-Dr. Sharlene Motts stopped his diurectic   Subjective:   Todd Gonzales is a 81 y.o. very pleasant male patient with Body mass index is 35.91 kg/m. who presents with the following:  Multiple symptom involvement  He has been seen now several times for similar complaints.  He does have some polyarthralgia, nasal congestion, cough.  He has been aching and feeling poorly for 3 weeks now.  Does have some negative little drainage. He also does have some lower pelvic pain as well as some nausea. At this point his diarrhea has resolved, but previously this has been going on for several weeks as well.  He initially did have some constipation, but this ultimately resolved and he had to have some loose stools and diarrhea.  History of multiple complicated UTIs, history of prostatitis.  Complicating matters, prior to the patient's urine culture he was taking oral antibiotics, this was on his own, and did not alert previous physicians.  Lower pelvic pain. Nausea and nasal congestion.  Feels like he has some flu symptoms, no energy, pain in the low abdomen.   Has had some contipation.  Then had about 3 weeks with some symptoms.    Aching and feeling bad for 3 weeks.   Nasal drainage but with allergies.     Review of Systems is noted in the HPI, as appropriate  Objective:   BP 120/80   Pulse 68   Temp 98.4 F (36.9 C) (Oral)   Ht '5\' 10"'  (1.778  m)   Wt 250 lb 4 oz (113.5 kg)   SpO2 94%   BMI 35.91 kg/m   GEN: No acute distress; alert,appropriate. ENT: clear TM, no LAD, oropharynx clear PSYCH: Normally interactive.  CV: RRR, no m/g/r  PULM: Normal respiratory rate, no accessory muscle use. Diffuse b rhonchi in both lung fields.  Laboratory and Imaging Data: Results for orders placed or performed in visit on 04/12/22  PSA, Medicare  Result Value Ref Range   PSA 3.98 0.10 - 4.00 ng/ml    Lab Review:     Latest Ref Rng & Units 02/14/2022    3:49 PM 09/17/2019   12:00 AM 11/30/2018   11:27 AM  CBC EXTENDED  WBC 4.0 - 10.5 K/uL 6.5   5.5   7.3    RBC 4.22 - 5.81 Mil/uL 4.39    4.99    Hemoglobin 13.0 - 17.0 g/dL 14.2   14.8   15.9    HCT 39.0 - 52.0 % 43.0    48.6    Platelets 150.0 - 400.0 K/uL 208.0   176   229    NEUT# 1.4 - 7.7 K/uL 4.5      Lymph# 0.7 - 4.0 K/uL 1.2           Latest Ref Rng & Units 02/14/2022  3:49 PM 09/17/2019   12:00 AM 11/30/2018   11:27 AM  BMP  Glucose 70 - 99 mg/dL 90    122    BUN 6 - 23 mg/dL 18    14    Creatinine 0.40 - 1.50 mg/dL 0.81   0.9   0.75    Sodium 135 - 145 mEq/L 142    139    Potassium 3.5 - 5.1 mEq/L 4.6    4.2    Chloride 96 - 112 mEq/L 104    104    CO2 19 - 32 mEq/L 31    27    Calcium 8.4 - 10.5 mg/dL 9.1    8.9         Latest Ref Rng & Units 02/14/2022    3:49 PM 09/17/2019   12:00 AM 08/25/2016   12:00 AM  Hepatic Function  Total Protein 6.0 - 8.3 g/dL 6.1      Albumin 3.5 - 5.2 g/dL 4.1      AST 0 - 37 U/L '13   17   22       ' ALT 0 - 53 U/L '13   16   23       ' Alk Phosphatase 39 - 117 U/L 44      Total Bilirubin 0.2 - 1.2 mg/dL 0.9         This result is from an external source.    Lab Results  Component Value Date   CHOL 168 02/14/2022   Lab Results  Component Value Date   HDL 49.30 02/14/2022   Lab Results  Component Value Date   LDLCALC 84 02/14/2022   Lab Results  Component Value Date   TRIG 171.0 (H) 02/14/2022   Lab Results   Component Value Date   CHOLHDL 3 02/14/2022   No results for input(s): PSA in the last 72 hours. No results found for: HCVAB Lab Results  Component Value Date   VD25OH 30.36 02/14/2022   VD25OH 26.2 09/17/2019     Lab Results  Component Value Date   HGBA1C 5.2 03/26/2020   Lab Results  Component Value Date   LDLCALC 84 02/14/2022   CREATININE 0.81 02/14/2022     Assessment and Plan:     ICD-10-CM   1. Pelvic pain  R10.2     2. Urgency incontinence  N39.41     3. Respiratory crackles of both lungs  R09.89      Multiple ongoing symptoms.  Failure of treatment thus far.  I worry that his initial urine culture may have been false, given that he was on oral antibiotics.  At this point, I think that it is appropriate to place him on some oral antibiotics.  He certainly could also have prostatitis as well given his clinical presentation.  A PSA less than 4 does not fully exclude prostatitis, so I think we do not need to treat for this.  Bilateral respiratory crackles, persistent illness, I think it is also appropriate to treat for this.  Multiple symptoms, question if there is a unifying diagnosis, nevertheless if I treat with levofloxacin he will bill covered from a pneumonia standpoint, prostatitis, as well as potential UTI.  He also had some questions regarding lower extremity edema, labs have been pretty normal.  Does have some venous insufficiency.  I am ultimately going to defer this to his primary care physician.  He did recently stop his hydrochlorothiazide.  Medication Management during today's office visit: Meds ordered this  encounter  Medications   levofloxacin (LEVAQUIN) 500 MG tablet    Sig: Take 1 tablet (500 mg total) by mouth daily.    Dispense:  10 tablet    Refill:  0   There are no discontinued medications.  Orders placed today for conditions managed today: No orders of the defined types were placed in this encounter.   Follow-up if needed: No  follow-ups on file.  [employer] speech-to-text software was used for transcription in this dictation.  Possible transcriptional errors can occur using Editor, commissioning.   Signed,  Maud Deed. Jahvier Aldea, MD   Outpatient Encounter Medications as of 04/20/2022  Medication Sig   acetaminophen (TYLENOL) 325 MG tablet Take 650 mg by mouth every 6 (six) hours as needed.   benazepril (LOTENSIN) 10 MG tablet Take 1 tablet (10 mg total) by mouth daily.   benazepril-hydrochlorthiazide (LOTENSIN HCT) 20-12.5 MG tablet Take one half tablet by mouth daily.  Please make an appointment for physical exam prior to further refills.   celecoxib (CELEBREX) 200 MG capsule TAKE 1 CAPSULE BY MOUTH TWICE DAILY   Cholecalciferol (VITAMIN D-1000 MAX ST) 25 MCG (1000 UT) tablet Take 2 tablets (2,000 Units total) by mouth daily.   clobetasol ointment (TEMOVATE) 0.05 % APPLY TO AFFECTED AREAS ON BOTH LEGS TWICE DAILY AFTER SHOWER   diclofenac Sodium (VOLTAREN) 1 % GEL Apply to affected joints qid prn   gabapentin (NEURONTIN) 300 MG capsule TAKE 1 CAPSULE BY MOUTH ONCE DAILY   hydrocortisone (PROCTOSOL HC) 2.5 % rectal cream Place 1 application. rectally 2 (two) times daily.   levofloxacin (LEVAQUIN) 500 MG tablet Take 1 tablet (500 mg total) by mouth daily.   oxymetazoline (AFRIN) 0.05 % nasal spray Place 2 sprays into the nose at bedtime as needed for congestion (allergies).    sodium chloride (OCEAN) 0.65 % nasal spray Place 1 spray into the nose daily as needed for congestion.    tamsulosin (FLOMAX) 0.4 MG CAPS capsule Take 2 capsules (0.8 mg total) by mouth daily.   No facility-administered encounter medications on file as of 04/20/2022.

## 2022-04-27 ENCOUNTER — Encounter: Payer: Self-pay | Admitting: Family Medicine

## 2022-04-28 DIAGNOSIS — G629 Polyneuropathy, unspecified: Secondary | ICD-10-CM | POA: Diagnosis not present

## 2022-04-28 DIAGNOSIS — R159 Full incontinence of feces: Secondary | ICD-10-CM | POA: Diagnosis not present

## 2022-04-28 DIAGNOSIS — N401 Enlarged prostate with lower urinary tract symptoms: Secondary | ICD-10-CM | POA: Diagnosis not present

## 2022-04-28 DIAGNOSIS — I10 Essential (primary) hypertension: Secondary | ICD-10-CM | POA: Diagnosis not present

## 2022-04-28 DIAGNOSIS — E559 Vitamin D deficiency, unspecified: Secondary | ICD-10-CM | POA: Diagnosis not present

## 2022-04-28 DIAGNOSIS — M48061 Spinal stenosis, lumbar region without neurogenic claudication: Secondary | ICD-10-CM | POA: Diagnosis not present

## 2022-04-28 DIAGNOSIS — E785 Hyperlipidemia, unspecified: Secondary | ICD-10-CM | POA: Diagnosis not present

## 2022-04-28 DIAGNOSIS — Z9181 History of falling: Secondary | ICD-10-CM | POA: Diagnosis not present

## 2022-04-28 DIAGNOSIS — Z96643 Presence of artificial hip joint, bilateral: Secondary | ICD-10-CM | POA: Diagnosis not present

## 2022-04-28 DIAGNOSIS — G959 Disease of spinal cord, unspecified: Secondary | ICD-10-CM | POA: Diagnosis not present

## 2022-04-28 DIAGNOSIS — M170 Bilateral primary osteoarthritis of knee: Secondary | ICD-10-CM | POA: Diagnosis not present

## 2022-04-28 DIAGNOSIS — R351 Nocturia: Secondary | ICD-10-CM | POA: Diagnosis not present

## 2022-04-28 DIAGNOSIS — Z85828 Personal history of other malignant neoplasm of skin: Secondary | ICD-10-CM | POA: Diagnosis not present

## 2022-04-28 DIAGNOSIS — M5412 Radiculopathy, cervical region: Secondary | ICD-10-CM | POA: Diagnosis not present

## 2022-04-29 ENCOUNTER — Telehealth: Payer: Self-pay | Admitting: Family Medicine

## 2022-04-29 NOTE — Telephone Encounter (Signed)
Patent called to follow up on message.

## 2022-05-03 ENCOUNTER — Telehealth: Payer: Self-pay | Admitting: Family Medicine

## 2022-05-03 NOTE — Telephone Encounter (Signed)
Noted. Thanks.

## 2022-05-03 NOTE — Telephone Encounter (Signed)
I spoke with Todd Gonzales; ankles started to swell after stopping diuretic 7 - 10 days ago. No swelling in lower legs. No redness in ankles but has sharp pain in lt ankle when standing and walking and dull pain pain if just sitting in lt ankle; pain level is 5. No CP or SOB. Swelling in ankles does seem to go away over night especially if urinates a lot. Todd Gonzales said the swelling seems to be under better control right now. Todd Gonzales keeps feet up while sitting 25 % of time. Advised Todd Gonzales try to elevate feet when sitting and Todd Gonzales voiced understanding. Todd Gonzales said the lower lt side of abd from naval to lt side has dull  pain on and off and seems to be better than last wk but still there.  No abd pain now. Last abd pain or soreness in last few days.Todd Gonzales denies fever and constipation. Todd Gonzales had loose stools 3 days ago. No N&V. No blood in urine or stools. Todd Gonzales already has appt scheduled with Dr Damita Dunnings on 05/05/22 at 11:30. UC & ED precautions given and Todd Gonzales voiced understanding. Sending note to DR Damita Dunnings and Janett Billow CMA.

## 2022-05-03 NOTE — Telephone Encounter (Signed)
Please triage patient and see about getting him scheduled with me in the near future.  Thanks.

## 2022-05-05 ENCOUNTER — Encounter: Payer: Self-pay | Admitting: Family Medicine

## 2022-05-05 ENCOUNTER — Ambulatory Visit (INDEPENDENT_AMBULATORY_CARE_PROVIDER_SITE_OTHER): Payer: Medicare Other | Admitting: Family Medicine

## 2022-05-05 ENCOUNTER — Encounter: Payer: Self-pay | Admitting: Nurse Practitioner

## 2022-05-05 VITALS — BP 122/72 | HR 67 | Temp 98.2°F | Ht 70.0 in | Wt 244.0 lb

## 2022-05-05 DIAGNOSIS — K5900 Constipation, unspecified: Secondary | ICD-10-CM | POA: Diagnosis not present

## 2022-05-05 DIAGNOSIS — I10 Essential (primary) hypertension: Secondary | ICD-10-CM

## 2022-05-05 MED ORDER — BENAZEPRIL-HYDROCHLOROTHIAZIDE 20-12.5 MG PO TABS: ORAL_TABLET | ORAL | 0 refills | Status: DC

## 2022-05-05 NOTE — Progress Notes (Signed)
Ankle swelling.  Weight is stable.  Recent neg covid test.  Unclear if he had another viral illness.  He had joint pain, lower pelvic pain (that is improving).  No fevers.  No dysuria, stream is good.  Prev OV on 04/20/22 with chest exam: B rhonchi.  Sinus sx are nearly resolved in the meantime.  Still with some sx attributed to pollen exposure.    He is off HCTZ in the meantime. Some BLE edema in the meantime.    His urine stream is better. Unclear if from higher dose of flomax, stopping HCTZ, and/or from levaquin use or some combination of the above.  Longstanding issue, if he takes a nap after lunch he just doesn't feel well for about 20 minutes.  Then it will improve.  He doesn't have the same issues after getting up from sleep the night before.    He was asking about seeing Dr. Cristina Gong, referral placed, re: constipation.  Miralax helps.    Meds, vitals, and allergies reviewed.   ROS: Per HPI unless specifically indicated in ROS section   Nad ncat L max sinus is a little sensitive at baseline, otherwise sinuses are nontender to palpation. TM wnl  Neck supple, no A Ctab Abd soft.  Not tender to palpation. Trace to 1+ BLE edema.   Skin well perfused.

## 2022-05-05 NOTE — Patient Instructions (Addendum)
Stop the plain benazepril and restart 1/2 tab of benazepril/HCTZ.  Let me know about your urinary stream/symptoms and swelling in about 1 week sooner if needed.  We'll go from there.   Take care.  Glad to see you. We'll call about seeing GI.  Keep taking miralax in the meantime.

## 2022-05-08 DIAGNOSIS — K5900 Constipation, unspecified: Secondary | ICD-10-CM | POA: Insufficient documentation

## 2022-05-08 NOTE — Assessment & Plan Note (Signed)
Refer to GI.  Keep taking miralax in the meantime.

## 2022-05-08 NOTE — Assessment & Plan Note (Signed)
Discussed options.  He previously had bilateral rhonchi but today his chest is clear.  It is unclear to me if he had another viral illness that is improving in the meantime.  We talked about options.  He will stop the plain benazepril and restart 1/2 tab of benazepril/HCTZ.  He can let me know about his urinary stream/symptoms and swelling in about 1 week sooner if needed.  We'll go from there.

## 2022-05-23 DIAGNOSIS — M170 Bilateral primary osteoarthritis of knee: Secondary | ICD-10-CM | POA: Diagnosis not present

## 2022-06-01 ENCOUNTER — Encounter: Payer: Self-pay | Admitting: Nurse Practitioner

## 2022-06-01 ENCOUNTER — Ambulatory Visit: Payer: Medicare Other | Admitting: Nurse Practitioner

## 2022-06-01 ENCOUNTER — Other Ambulatory Visit (INDEPENDENT_AMBULATORY_CARE_PROVIDER_SITE_OTHER): Payer: Medicare Other

## 2022-06-01 VITALS — BP 118/70 | HR 86 | Ht 70.0 in | Wt 243.0 lb

## 2022-06-01 DIAGNOSIS — K5900 Constipation, unspecified: Secondary | ICD-10-CM

## 2022-06-01 DIAGNOSIS — R110 Nausea: Secondary | ICD-10-CM | POA: Diagnosis not present

## 2022-06-01 LAB — CBC WITH DIFFERENTIAL/PLATELET
Basophils Absolute: 0.1 10*3/uL (ref 0.0–0.1)
Basophils Relative: 0.9 % (ref 0.0–3.0)
Eosinophils Absolute: 0.1 10*3/uL (ref 0.0–0.7)
Eosinophils Relative: 1 % (ref 0.0–5.0)
HCT: 44.7 % (ref 39.0–52.0)
Hemoglobin: 14.6 g/dL (ref 13.0–17.0)
Lymphocytes Relative: 16.2 % (ref 12.0–46.0)
Lymphs Abs: 1.3 10*3/uL (ref 0.7–4.0)
MCHC: 32.6 g/dL (ref 30.0–36.0)
MCV: 97.8 fl (ref 78.0–100.0)
Monocytes Absolute: 0.7 10*3/uL (ref 0.1–1.0)
Monocytes Relative: 8.8 % (ref 3.0–12.0)
Neutro Abs: 5.9 10*3/uL (ref 1.4–7.7)
Neutrophils Relative %: 73.1 % (ref 43.0–77.0)
Platelets: 172 10*3/uL (ref 150.0–400.0)
RBC: 4.57 Mil/uL (ref 4.22–5.81)
RDW: 13.2 % (ref 11.5–15.5)
WBC: 8 10*3/uL (ref 4.0–10.5)

## 2022-06-01 LAB — TSH: TSH: 1.15 u[IU]/mL (ref 0.35–5.50)

## 2022-06-01 MED ORDER — FAMOTIDINE 20 MG PO TABS: 20.0000 mg | ORAL_TABLET | Freq: Every day | ORAL | 1 refills | Status: DC

## 2022-06-01 NOTE — Progress Notes (Signed)
06/01/2022 PIER BOSHER [id number] Feb 08, 1941   CHIEF COMPLAINT: Constipation  HISTORY OF PRESENT ILLNESS: Jeneen Rinks L. Rabbani is an 81 year old male with a past medical history of arthritis, hypertension, numerous areas of squamous cell skin carcinoma status post excisions 2006 - 2022, UTI and prostatitis. Past right and left hip replacement surgeries. He presents to our office today as referred by Dr. Elsie Stain for further evaluation regarding constipation. He is accompanied by his wife.  He complains of having a change in bowel pattern for the past few months.  He previously passed a fairly normal formed bowel movement daily, however, for the past few months he is passing a bowel movement every 2 to 3 days.  He has a bowel movement daily if he takes MiraLAX but he is not consistent with taking it.  He takes a Dulcolax suppository if no BM in 3 days.  No rectal bleeding or black stools.  He underwent a colonoscopy by Dr. Sharlett Iles 05/11/2011 which showed diverticulosis to the descending and sigmoid colon, no polyps.  He also complains of having nausea for the past few months as well which is worse if he eats anything sweet for breakfast.  No vomiting.  No dysphagia, heartburn or upper abdominal pain.  He takes Celebrex once daily for "rheumatism".  He reported having a swelling to his legs for the past 2 months, his PCP has adjusted his HCTZ.  No known history of heart disease or CHF.  No chest pain, palpitations or shortness of breath.  He and his wife traveled to [place] with friends 2 weeks ago, He felt significantly fatigued for the entire week and spent most of his day in the hotel room.  He has lost 7 pounds over the past 6 weeks.     Latest Ref Rng & Units 02/14/2022    3:49 PM 09/17/2019   12:00 AM 11/30/2018   11:27 AM  CBC  WBC 4.0 - 10.5 K/uL 6.5  5.5  7.3   Hemoglobin 13.0 - 17.0 g/dL 14.2  14.8  15.9   Hematocrit 39.0 - 52.0 % 43.0   48.6   Platelets 150.0 - 400.0 K/uL 208.0   176  229        Latest Ref Rng & Units 02/14/2022    3:49 PM 09/17/2019   12:00 AM 11/30/2018   11:27 AM  CMP  Glucose 70 - 99 mg/dL 90   122   BUN 6 - 23 mg/dL 18   14   Creatinine 0.40 - 1.50 mg/dL 0.81  0.9  0.75   Sodium 135 - 145 mEq/L 142   139   Potassium 3.5 - 5.1 mEq/L 4.6   4.2   Chloride 96 - 112 mEq/L 104   104   CO2 19 - 32 mEq/L 31   27   Calcium 8.4 - 10.5 mg/dL 9.1   8.9   Total Protein 6.0 - 8.3 g/dL 6.1     Total Bilirubin 0.2 - 1.2 mg/dL 0.9     Alkaline Phos 39 - 117 U/L 44     AST 0 - 37 U/L 13  17    ALT 0 - 53 U/L 13  16      Colonoscopy 05/11/2011 by Dr. Sharlett Iles: Diverticulosis to the descending and sigmoid colon No polyps  Sigmoidoscopy age 49 reported as normal per the patient   Past Medical History:  Diagnosis Date   Arthritis    BPH (benign prostatic hyperplasia)  nocturia x4 at baseline   H/O hiatal hernia    Hemorrhoids    Hyperlipidemia    Hypertension    Dr Osborne Casco- LOV with clearance 3/13 on chart   IGT (impaired glucose tolerance)    last AIC 5.5- diet controlled- per office note 3/13   SCC (squamous cell carcinoma) 02/10/2005   scc in situ-left forearm.   SCCA (squamous cell carcinoma) of skin 02/10/2005   Right Outer Eye (in situ) (curet and excision)   SCCA (squamous cell carcinoma) of skin 02/13/2006   Right Ear (well diff) (curet and 5FU)   SCCA (squamous cell carcinoma) of skin 04/05/2006   Left Ear Bowl (in situ) (tx p bx)   SCCA (squamous cell carcinoma) of skin 07/06/2010   Right Cheek (in situ) (curet and zyclara)   SCCA (squamous cell carcinoma) of skin 04/27/2009   Left Upper Forearm (in situ) (curet 5FU)   SCCA (squamous cell carcinoma) of skin 08/28/2014   Right Cheek (well diff)   SCCA (squamous cell carcinoma) of skin 03/21/2016   Left Jawline Ant (well diff) (curet and 5FU)   SCCA (squamous cell carcinoma) of skin 01/19/2017   Right Hand (in situ) (tx p bx)   SCCA (squamous cell carcinoma) of skin  03/04/1840   Left Jawline Inf (in situ) (curet and 5FU)   SCCA (squamous cell carcinoma) of skin 12/23/2020   Left Forehead (well diff) (tx p bx)   SCCA (squamous cell carcinoma) of skin 12/23/2020   Left Submandibular Area (in situ) (tx p bx)   Seasonal allergies    Spinal stenosis    Squamous cell carcinoma of skin 08/31/2020   in situ-left jawline cheek   Squamous cell carcinoma of skin 04/27/2021   infil- left nasal sidewall(MOHS)   Squamous cell carcinoma of skin of face 04/27/2021   infil- Left Temporal scalp (MOHS   UTI (lower urinary tract infection)    required admission to [facility]   Past Surgical History:  Procedure Laterality Date   BACK SURGERY  2008   Laminectomy L4-5   JOINT REPLACEMENT Bilateral    bil   KNEE ARTHROSCOPY     right   LUMBAR LAMINECTOMY/DECOMPRESSION MICRODISCECTOMY Bilateral 11/20/2018   Procedure: Bilateral Thoracic Twelve to Lumbar One Laminectomy;  Surgeon: Kristeen Miss, MD;  Location: [facility];  Service: Neurosurgery;  Laterality: Bilateral;  posterior   TOTAL HIP ARTHROPLASTY  03/20/2012   Procedure: TOTAL HIP ARTHROPLASTY ANTERIOR APPROACH;  Surgeon: Mauri Pole, MD;  Location: [facility];  Service: Orthopedics;  Laterality: Left;   Social History: He is married.  Retired.  Nonsmoker. He drinks one glass of wine daily every evening.  Family History: Father with history of emphysema and lung cancer.  Mother with history of arthritis.  No known family history of esophageal, gastric or colon cancer.  Allergies  Allergen Reactions   Codeine Other (See Comments)    Unknown reaction   Sulfamethoxazole-Trimethoprim     Other reaction(s): light rash over face/chest      Outpatient Encounter Medications as of 06/01/2022  Medication Sig   acetaminophen (TYLENOL) 325 MG tablet Take 650 mg by mouth every 6 (six) hours as needed.   benazepril-hydrochlorthiazide (LOTENSIN HCT) 20-12.5 MG tablet Take one half tablet by mouth daily   celecoxib (CELEBREX)  200 MG capsule TAKE 1 CAPSULE BY MOUTH TWICE DAILY   Cholecalciferol (VITAMIN D-1000 MAX ST) 25 MCG (1000 UT) tablet Take 2 tablets (2,000 Units total) by mouth daily.   clobetasol ointment (TEMOVATE)  0.05 % APPLY TO AFFECTED AREAS ON BOTH LEGS TWICE DAILY AFTER SHOWER   diclofenac Sodium (VOLTAREN) 1 % GEL Apply to affected joints qid prn   gabapentin (NEURONTIN) 300 MG capsule TAKE 1 CAPSULE BY MOUTH ONCE DAILY   hydrocortisone (PROCTOSOL HC) 2.5 % rectal cream Place 1 application. rectally 2 (two) times daily.   oxymetazoline (AFRIN) 0.05 % nasal spray Place 2 sprays into the nose at bedtime as needed for congestion (allergies).    sodium chloride (OCEAN) 0.65 % nasal spray Place 1 spray into the nose daily as needed for congestion.    tamsulosin (FLOMAX) 0.4 MG CAPS capsule Take 2 capsules (0.8 mg total) by mouth daily.   No facility-administered encounter medications on file as of 06/01/2022.    REVIEW OF SYSTEMS: Gen: + Fatigue. Denies fever, sweats or chills. No weight loss.  CV: Denies chest pain, palpitations or edema. Resp: Denies cough, shortness of breath of hemoptysis.  GI: See HPI. GU : Denies urinary burning, blood in urine, increased urinary frequency or incontinence. MS: Denies joint pain, muscles aches or weakness. Derm: Denies rash, itchiness, skin lesions or unhealing ulcers. Psych: Denies depression, anxiety or memory loss. Heme: Denies bruising, easy bleeding. Neuro:  Denies headaches, dizziness or paresthesias. Endo:  Denies any problems with DM, thyroid or adrenal function.  PHYSICAL EXAM: BP 118/70   Pulse 86   Ht '5\' 10"'$  (1.778 m)   Wt 243 lb (110.2 kg)   BMI 34.87 kg/m   Wt Readings from Last 3 Encounters:  06/01/22 243 lb (110.2 kg)  05/05/22 244 lb (110.7 kg)  04/20/22 250 lb 4 oz (113.5 kg)    General: 81 year old male appears fatigued in no acute distress. Head: Normocephalic and atraumatic. Eyes:  Sclerae non-icteric, conjunctive pink. Ears:  Normal auditory acuity. Mouth: Dentition intact. No ulcers or lesions.  Neck: Supple, no lymphadenopathy or thyromegaly.  Lungs: Clear bilaterally to auscultation without wheezes, crackles or rhonchi. Heart: Regular rate and rhythm. No murmur, rub or gallop appreciated.  Abdomen: Soft, nontender, non distended. No masses. No hepatosplenomegaly. Normoactive bowel sounds x 4 quadrants.  Rectal: Deferred. Musculoskeletal: Symmetrical with no gross deformities. Skin: Warm and dry. No rash or lesions on visible extremities. Extremities: Bilateral lower extremities with mild edema.  Neurological: Alert oriented x 4, no focal deficits.  Psychological:  Alert and cooperative. Normal mood and affect.  ASSESSMENT AND PLAN:  66) 81 year old male with constipation x 2 to 3 months.  No polyps per colonoscopy in 2012. -TSH level  -Miralax Q HS -Benefiber 1 tablespoon daily as tolerated -Fiber diet as tolerated -No plans for a colonoscopy at this time  2) Nausea x 2 to 3 months. No vomiting or abdominal pain. Constipation may be contributing. 7lb weight loss past 6 weeks. On Celebrex daily for many years.  -Famotidine '20mg'$  once daily for possible gastritis   3) LE edema. Fatigue.  -TSH, CBC -Further evaluation/management per PCP. May consider cardiac evaluation.   Mr. Perrott will contact me in 2 weeks with an update. If he continues to have nausea and constipation I discussed scheduling an abdominal/pelvic CT scan to rule out intra-abdominal/pelvic abnormalities to explain his symptoms    CC:  Tonia Ghent, MD

## 2022-06-01 NOTE — Patient Instructions (Addendum)
1) Take Miralax 1 capful mixed in 8 ounces of water at bed time for constipation as tolerated. May mix in any clear liquid of choice, use a straw.   2) Benefiber 1 tablespoon once daily.  3) If nausea persists start Famotidine '20mg'$  one tab by mouth once daily  4) Contact Colleen NP with an update, we discussed scheduling an abdominal/pelvic CT scan if nausea and constipation persists.   Your provider has requested that you go to the basement level for lab work before leaving today. Press "B" on the elevator. The lab is located at the first door on the left as you exit the elevator.  If you are age 4 or older, your body mass index should be between 23-30. Your Body mass index is 34.87 kg/m. If this is out of the aforementioned range listed, please consider follow up with your Primary Care Provider.  If you are age 12 or younger, your body mass index should be between 19-25. Your Body mass index is 34.87 kg/m. If this is out of the aformentioned range listed, please consider follow up with your Primary Care Provider.   ________________________________________________________  The [facility] providers would like to encourage you to use [facility] to communicate with providers for non-urgent requests or questions.  Due to long hold times on the telephone, sending your provider a message by [facility] may be a faster and more efficient way to get a response.  Please allow 48 business hours for a response.  Please remember that this is for non-urgent requests.  _______________________________________________________  Thank you for choosing me and [facility].

## 2022-06-01 NOTE — Progress Notes (Signed)
I agree with the above note, plan 

## 2022-06-02 LAB — COMPLETE METABOLIC PANEL WITH GFR
AG Ratio: 2 (calc) (ref 1.0–2.5)
ALT: 18 U/L (ref 9–46)
AST: 13 U/L (ref 10–35)
Albumin: 4 g/dL (ref 3.6–5.1)
Alkaline phosphatase (APISO): 43 U/L (ref 35–144)
BUN: 23 mg/dL (ref 7–25)
CO2: 24 mmol/L (ref 20–32)
Calcium: 8.7 mg/dL (ref 8.6–10.3)
Chloride: 108 mmol/L (ref 98–110)
Creat: 0.96 mg/dL (ref 0.70–1.22)
Globulin: 2 g/dL (calc) (ref 1.9–3.7)
Glucose, Bld: 95 mg/dL (ref 65–99)
Potassium: 4.2 mmol/L (ref 3.5–5.3)
Sodium: 143 mmol/L (ref 135–146)
Total Bilirubin: 1.3 mg/dL — ABNORMAL HIGH (ref 0.2–1.2)
Total Protein: 6 g/dL — ABNORMAL LOW (ref 6.1–8.1)
eGFR: 78 mL/min/{1.73_m2} (ref >=60)

## 2022-06-06 ENCOUNTER — Other Ambulatory Visit: Payer: Self-pay

## 2022-06-06 DIAGNOSIS — R110 Nausea: Secondary | ICD-10-CM

## 2022-06-06 DIAGNOSIS — R17 Unspecified jaundice: Secondary | ICD-10-CM

## 2022-06-11 ENCOUNTER — Other Ambulatory Visit: Payer: Self-pay | Admitting: Physician Assistant

## 2022-06-11 DIAGNOSIS — L570 Actinic keratosis: Secondary | ICD-10-CM

## 2022-06-13 ENCOUNTER — Ambulatory Visit: Payer: Medicare Other

## 2022-06-13 ENCOUNTER — Ambulatory Visit
Admission: RE | Admit: 2022-06-13 | Discharge: 2022-06-13 | Disposition: A | Discharge status: Home / Self Care | Payer: Medicare Other | Source: Ambulatory Visit | Attending: Nurse Practitioner | Admitting: Nurse Practitioner

## 2022-06-13 DIAGNOSIS — R110 Nausea: Secondary | ICD-10-CM

## 2022-06-13 DIAGNOSIS — R634 Abnormal weight loss: Secondary | ICD-10-CM | POA: Diagnosis not present

## 2022-06-13 DIAGNOSIS — R17 Unspecified jaundice: Secondary | ICD-10-CM

## 2022-06-17 NOTE — Telephone Encounter (Signed)
error 

## 2022-06-20 ENCOUNTER — Telehealth: Payer: Self-pay | Admitting: Dermatology

## 2022-06-20 NOTE — Telephone Encounter (Signed)
Patient left message on office voice mail that he is returning our phone call.

## 2022-06-20 NOTE — Telephone Encounter (Signed)
Phone call to patient to see which topicals he's needing and to inform him that he doesn't have an appointment schedule with our office on 06/28/22.

## 2022-06-20 NOTE — Telephone Encounter (Addendum)
Left message: says pharmacy "has reached out" to [place] about 2  topical Rx refills and he wants to know what's going on. Also said he thinks he has an appt on 7/25, but he's wrong. He has nothing scheduled

## 2022-06-21 NOTE — Telephone Encounter (Signed)
Phone call to patient regarding the creams he's needing. Voicemail left for patient to give the office a call back.

## 2022-06-22 ENCOUNTER — Encounter: Payer: Self-pay | Admitting: Dermatology

## 2022-06-22 ENCOUNTER — Ambulatory Visit: Payer: Medicare Other | Admitting: Dermatology

## 2022-06-22 ENCOUNTER — Telehealth: Payer: Self-pay | Admitting: Dermatology

## 2022-06-22 DIAGNOSIS — I872 Venous insufficiency (chronic) (peripheral): Secondary | ICD-10-CM

## 2022-06-22 DIAGNOSIS — L89320 Pressure ulcer of left buttock, unstageable: Secondary | ICD-10-CM | POA: Diagnosis not present

## 2022-06-22 MED ORDER — EMOLLIENT BASE EX CREA: TOPICAL_CREAM | CUTANEOUS | 0 refills | Status: DC | PRN

## 2022-06-22 MED ORDER — CLOBETASOL PROPIONATE 0.05 % EX OINT: TOPICAL_OINTMENT | CUTANEOUS | 2 refills | Status: DC

## 2022-06-22 NOTE — Progress Notes (Signed)
   Follow-Up Visit   Subjective  Todd Gonzales is a 81 y.o. male who presents for the following: Skin Problem (Has lesion on left buttock. Comes and goes. Has had this problem before. Started out as a rash. Treatment triple antibiotic cream. [facility] refills ).  Nonhealing spot on left inner buttocks Location:  Duration:  Quality:  Associated Signs/Symptoms: Modifying Factors:  Severity:  Timing: Context:   Objective  Well appearing patient in no apparent distress; mood and affect are within normal limits. Right Lower Leg - Anterior Patient needed a refill on his clobetasol ointment.  Leg dermatitis generally under control with as needed use of topical clobetasol.  He has good understanding about techniques to minimize venous pressure.  Gluteal Crease See persistent 1 cm elongated erosion without obvious deep involvement.  Nontender, no significant edema or halo erythema.  Clinically this best fits a mechanical skin breakdown and I do not feel that biopsy is indicated to rule out skin cancer.         A focused examination was performed including buttocks, legs.. Relevant physical exam findings are noted in the Assessment and Plan.   Assessment & Plan    Venous stasis dermatitis of both lower extremities Right Lower Leg - Anterior  Refill clobetasol ointment, do not use on face or body folds  Related Medications clobetasol ointment (TEMOVATE) 0.05 % APPLY TO AFFECTED AREAS ON BOTH LEGS TWICE DAILY AFTER SHOWER  Venous stasis dermatitis of left lower extremity Left Lower Leg - Anterior  clobetasol ointment (TEMOVATE) 0.05 % - Left Lower Leg - Anterior APPLY TO AFFECTED AREAS ON BOTH LEGS TWICE DAILY AFTER SHOWER  Stasis dermatitis of both legs  Related Medications clobetasol ointment (TEMOVATE) 0.05 % APPLY TO AFFECTED AREAS ON BOTH LEGS TWICE DAILY AFTER SHOWER  Pressure injury of left buttock, unstageable (HCC) Gluteal Crease  If available, I would like him to  try topical Biafine daily for minimal of 6 to 8 weeks.  I am okay with the use of a simple bandage if he is not allergic to the adhesive.  If this is not helping I encouraged him to have his primary doctor refer him for second opinion to one of the excellent [facility] plastic surgeons.  emollient (BIAFINE) cream - Gluteal Crease Apply topically as needed.  Ambulatory referral to Plastic Surgery - Gluteal Crease      I, Lavonna Monarch, MD, have reviewed all documentation for this visit.  The documentation on 06/22/22 for the exam, diagnosis, procedures, and orders are all accurate and complete.

## 2022-06-22 NOTE — Patient Instructions (Addendum)
We will see if we can contact the [facility] to see about a referral if topical does not help for alternative treatment.

## 2022-06-22 NOTE — Telephone Encounter (Signed)
Pharmacy left message saying they can't get the emulsion that was Rx'd and wants to know if there is something else that be prescribed instead. Patient was here this morning.

## 2022-06-23 ENCOUNTER — Telehealth: Payer: Self-pay | Admitting: Dermatology

## 2022-06-23 DIAGNOSIS — L89320 Pressure ulcer of left buttock, unstageable: Secondary | ICD-10-CM

## 2022-06-23 MED ORDER — EMOLLIENT BASE EX CREA: TOPICAL_CREAM | CUTANEOUS | 0 refills | Status: DC | PRN

## 2022-06-23 NOTE — Telephone Encounter (Signed)
Per Dr. Denna Haggard okay to send in the prescription. Patient aware.

## 2022-06-23 NOTE — Telephone Encounter (Signed)
Said we cancelled the Rx that was called in because insurance wouldn't pay for it. He said he wants it called back in and he's OK to pay for it. Pharmacy: [facility].

## 2022-06-23 NOTE — Telephone Encounter (Signed)
Phone call to patient regarding Dr. Denna Haggard giving the okay to cancel the patient's prescription and to have him see plastics first and then go from there. Patient states that he called plastics and told them he didn't want to be seen and he would like Dr. Denna Haggard to call the medication back into his pharmacy.

## 2022-06-23 NOTE — Telephone Encounter (Signed)
Phone call to patient's Pharmacy to inform them that Dr. Denna Haggard is okay with holding off on the medication at this time. Pharmacist aware.

## 2022-06-23 NOTE — Addendum Note (Signed)
Addended by: Lennie Odor on: 06/23/2022 03:37 PM   Modules accepted: Orders

## 2022-07-08 DIAGNOSIS — M25551 Pain in right hip: Secondary | ICD-10-CM | POA: Diagnosis not present

## 2022-07-08 DIAGNOSIS — M545 Low back pain, unspecified: Secondary | ICD-10-CM | POA: Diagnosis not present

## 2022-08-02 ENCOUNTER — Encounter: Payer: Self-pay | Admitting: Family Medicine

## 2022-08-02 ENCOUNTER — Ambulatory Visit (INDEPENDENT_AMBULATORY_CARE_PROVIDER_SITE_OTHER): Payer: Medicare Other | Admitting: Family Medicine

## 2022-08-02 DIAGNOSIS — I10 Essential (primary) hypertension: Secondary | ICD-10-CM | POA: Diagnosis not present

## 2022-08-02 MED ORDER — HYDROCHLOROTHIAZIDE 12.5 MG PO TABS: 6.2500 mg | ORAL_TABLET | Freq: Every day | ORAL | 3 refills | Status: DC

## 2022-08-02 NOTE — Patient Instructions (Signed)
Stop benazepril HCTZ.    Change to plain HCTZ 1/2 tab a day.  That is the same dose of HCTZ without the benazepril.   See if you feel less lightheaded over the next 1-2 weeks.    If not lightheaded, you could try increasing HCTZ to 1 tab a day for swelling.    Update me as needed.

## 2022-08-02 NOTE — Progress Notes (Unsigned)
Ankle swelling.  Back on HCTZ and sx improved with that.  Concurrently on ACE inhibitor.  He is voiding better with flomax 0.'8mg'$  daily.  Lightheaded cautions d/w pt.  No falls.    Meds, vitals, and allergies reviewed.   ROS: Per HPI unless specifically indicated in ROS section   Nad Ncat Neck supple, no LA Rrr Ctab Abd soft, not ttp 1+ BLE edema.

## 2022-08-03 NOTE — Assessment & Plan Note (Signed)
With edema improved back on hydrochlorothiazide but not resolved in the meantime.  Discussed options.  Stop benazepril HCTZ.    Change to plain HCTZ 1/2 tab a day.  That is the same dose of HCTZ but without the benazepril.   He can see if he feels less lightheaded over the next 1-2 weeks.    If not lightheaded, he could try increasing HCTZ to 1 tab a day for swelling.    Update me as needed.

## 2022-08-05 ENCOUNTER — Ambulatory Visit (INDEPENDENT_AMBULATORY_CARE_PROVIDER_SITE_OTHER): Payer: Medicare Other

## 2022-08-05 VITALS — Ht 70.0 in | Wt 249.0 lb

## 2022-08-05 DIAGNOSIS — Z0000 Encounter for general adult medical examination without abnormal findings: Secondary | ICD-10-CM | POA: Diagnosis not present

## 2022-08-05 NOTE — Patient Instructions (Signed)
[name] , Thank you for taking time to come for your Medicare Wellness Visit. I appreciate your ongoing commitment to your health goals. Please review the following plan we discussed and let me know if I can assist you in the future.   Screening recommendations/referrals: Colonoscopy: aged out Recommended yearly ophthalmology/optometry visit for glaucoma screening and checkup Recommended yearly dental visit for hygiene and checkup  Vaccinations: Influenza vaccine: n/d Pneumococcal vaccine: 09/05/16 Tdap vaccine: 10/26/21 Shingles vaccine: Zostavax 03/23/09   Covid-19: 01/01/20, 02/06/20, 10/10/20, 04/17/21  Advanced directives: no  Conditions/risks identified: none  Next appointment: Follow up in one year for your annual wellness visit. 08/08/23 @ 3:45 pm by phone  Preventive Care 65 Years and Older, Male Preventive care refers to lifestyle choices and visits with your health care provider that can promote health and wellness. What does preventive care include? A yearly physical exam. This is also called an annual well check. Dental exams once or twice a year. Routine eye exams. Ask your health care provider how often you should have your eyes checked. Personal lifestyle choices, including: Daily care of your teeth and gums. Regular physical activity. Eating a healthy diet. Avoiding tobacco and drug use. Limiting alcohol use. Practicing safe sex. Taking low doses of aspirin every day. Taking vitamin and mineral supplements as recommended by your health care provider. What happens during an annual well check? The services and screenings done by your health care provider during your annual well check will depend on your age, overall health, lifestyle risk factors, and family history of disease. Counseling  Your health care provider may ask you questions about your: Alcohol use. Tobacco use. Drug use. Emotional well-being. Home and relationship well-being. Sexual activity. Eating  habits. History of falls. Memory and ability to understand (cognition). Work and work Statistician. Screening  You may have the following tests or measurements: Height, weight, and BMI. Blood pressure. Lipid and cholesterol levels. These may be checked every 5 years, or more frequently if you are over 63 years old. Skin check. Lung cancer screening. You may have this screening every year starting at age 8 if you have a 30-pack-year history of smoking and currently smoke or have quit within the past 15 years. Fecal occult blood test (FOBT) of the stool. You may have this test every year starting at age 7. Flexible sigmoidoscopy or colonoscopy. You may have a sigmoidoscopy every 5 years or a colonoscopy every 10 years starting at age 79. Prostate cancer screening. Recommendations will vary depending on your family history and other risks. Hepatitis C blood test. Hepatitis B blood test. Sexually transmitted disease (STD) testing. Diabetes screening. This is done by checking your blood sugar (glucose) after you have not eaten for a while (fasting). You may have this done every 1-3 years. Abdominal aortic aneurysm (AAA) screening. You may need this if you are a current or former smoker. Osteoporosis. You may be screened starting at age 72 if you are at high risk. Talk with your health care provider about your test results, treatment options, and if necessary, the need for more tests. Vaccines  Your health care provider may recommend certain vaccines, such as: Influenza vaccine. This is recommended every year. Tetanus, diphtheria, and acellular pertussis (Tdap, Td) vaccine. You may need a Td booster every 10 years. Zoster vaccine. You may need this after age 1. Pneumococcal 13-valent conjugate (PCV13) vaccine. One dose is recommended after age 1. Pneumococcal polysaccharide (PPSV23) vaccine. One dose is recommended after age 83. Talk to  your health care provider about which screenings and  vaccines you need and how often you need them. This information is not intended to replace advice given to you by your health care provider. Make sure you discuss any questions you have with your health care provider. Document Released: 12/18/2015 Document Revised: 08/10/2016 Document Reviewed: 09/22/2015 Elsevier Interactive Patient Education  2017 [place] Prevention in the Home Falls can cause injuries. They can happen to people of all ages. There are many things you can do to make your home safe and to help prevent falls. What can I do on the outside of my home? Regularly fix the edges of walkways and driveways and fix any cracks. Remove anything that might make you trip as you walk through a door, such as a raised step or threshold. Trim any bushes or trees on the path to your home. Use bright outdoor lighting. Clear any walking paths of anything that might make someone trip, such as rocks or tools. Regularly check to see if handrails are loose or broken. Make sure that both sides of any steps have handrails. Any raised decks and porches should have guardrails on the edges. Have any leaves, snow, or ice cleared regularly. Use sand or salt on walking paths during winter. Clean up any spills in your garage right away. This includes oil or grease spills. What can I do in the bathroom? Use night lights. Install grab bars by the toilet and in the tub and shower. Do not use towel bars as grab bars. Use non-skid mats or decals in the tub or shower. If you need to sit down in the shower, use a plastic, non-slip stool. Keep the floor dry. Clean up any water that spills on the floor as soon as it happens. Remove soap buildup in the tub or shower regularly. Attach bath mats securely with double-sided non-slip rug tape. Do not have throw rugs and other things on the floor that can make you trip. What can I do in the bedroom? Use night lights. Make sure that you have a light by your  bed that is easy to reach. Do not use any sheets or blankets that are too big for your bed. They should not hang down onto the floor. Have a firm chair that has side arms. You can use this for support while you get dressed. Do not have throw rugs and other things on the floor that can make you trip. What can I do in the kitchen? Clean up any spills right away. Avoid walking on wet floors. Keep items that you use a lot in easy-to-reach places. If you need to reach something above you, use a strong step stool that has a grab bar. Keep electrical cords out of the way. Do not use floor polish or wax that makes floors slippery. If you must use wax, use non-skid floor wax. Do not have throw rugs and other things on the floor that can make you trip. What can I do with my stairs? Do not leave any items on the stairs. Make sure that there are handrails on both sides of the stairs and use them. Fix handrails that are broken or loose. Make sure that handrails are as long as the stairways. Check any carpeting to make sure that it is firmly attached to the stairs. Fix any carpet that is loose or worn. Avoid having throw rugs at the top or bottom of the stairs. If you do have throw rugs, attach  them to the floor with carpet tape. Make sure that you have a light switch at the top of the stairs and the bottom of the stairs. If you do not have them, ask someone to add them for you. What else can I do to help prevent falls? Wear shoes that: Do not have high heels. Have rubber bottoms. Are comfortable and fit you well. Are closed at the toe. Do not wear sandals. If you use a stepladder: Make sure that it is fully opened. Do not climb a closed stepladder. Make sure that both sides of the stepladder are locked into place. Ask someone to hold it for you, if possible. Clearly mark and make sure that you can see: Any grab bars or handrails. First and last steps. Where the edge of each step is. Use tools that  help you move around (mobility aids) if they are needed. These include: Canes. Walkers. Scooters. Crutches. Turn on the lights when you go into a dark area. Replace any light bulbs as soon as they burn out. Set up your furniture so you have a clear path. Avoid moving your furniture around. If any of your floors are uneven, fix them. If there are any pets around you, be aware of where they are. Review your medicines with your doctor. Some medicines can make you feel dizzy. This can increase your chance of falling. Ask your doctor what other things that you can do to help prevent falls. This information is not intended to replace advice given to you by your health care provider. Make sure you discuss any questions you have with your health care provider. Document Released: 09/17/2009 Document Revised: 04/28/2016 Document Reviewed: 12/26/2014 Elsevier Interactive Patient Education  2017 [employer].

## 2022-08-05 NOTE — Progress Notes (Signed)
Virtual Visit via Telephone Note  I connected with  Todd Gonzales on 08/05/22 at  8:15 AM EDT by telephone and verified that I am speaking with the correct person using two identifiers.  Location: Patient: home Provider: [place] Persons participating in the virtual visit: [place]   I discussed the limitations, risks, security and privacy concerns of performing an evaluation and management service by telephone and the availability of in person appointments. The patient expressed understanding and agreed to proceed.  Interactive audio and video telecommunications were attempted between this nurse and patient, however failed, due to patient having technical difficulties OR patient did not have access to video capability.  We continued and completed visit with audio only.  Some vital signs may be absent or patient reported.   Dionisio David, LPN  Subjective:   Todd Gonzales is a 81 y.o. male who presents for Medicare Annual/Subsequent preventive examination.  Review of Systems     Cardiac Risk Factors include: advanced age (>8mn, >>83women)     Objective:    There were no vitals filed for this visit. There is no height or weight on file to calculate BMI.     08/05/2022    8:21 AM 10/26/2021    1:46 PM 11/30/2018    6:00 PM 11/30/2018   11:28 AM 11/20/2018    6:29 AM 11/07/2018    1:37 PM 03/20/2012    5:15 PM  Advanced Directives  Does Patient Have a Medical Advance Directive? No No No No  Yes Patient has advance directive, copy not in chart  Type of Advance Directive      HKellyLiving will   Copy of HPeruin Chart?   No - copy requested No - copy requested No - copy requested No - copy requested Copy requested from family  Would patient like information on creating a medical advance directive? No - Patient declined No - Patient declined No - Patient declined No - Patient declined     Pre-existing out  of facility DNR order (yellow form or pink MOST form)       No    Current Medications (verified) Outpatient Encounter Medications as of 08/05/2022  Medication Sig   acetaminophen (TYLENOL) 325 MG tablet Take 650 mg by mouth every 6 (six) hours as needed.   celecoxib (CELEBREX) 200 MG capsule TAKE 1 CAPSULE BY MOUTH TWICE DAILY   Cholecalciferol (VITAMIN D-1000 MAX ST) 25 MCG (1000 UT) tablet Take 2 tablets (2,000 Units total) by mouth daily.   clobetasol ointment (TEMOVATE) 0.05 % APPLY TO AFFECTED AREAS ON BOTH LEGS TWICE DAILY AFTER SHOWER   diclofenac Sodium (VOLTAREN) 1 % GEL Apply to affected joints qid prn   emollient (BIAFINE) cream Apply topically as needed.   gabapentin (NEURONTIN) 300 MG capsule TAKE 1 CAPSULE BY MOUTH ONCE DAILY   hydrochlorothiazide (HYDRODIURIL) 12.5 MG tablet Take 0.5-1 tablets (6.25-12.5 mg total) by mouth daily.   hydrocortisone (PROCTOSOL HC) 2.5 % rectal cream Place 1 application. rectally 2 (two) times daily.   oxymetazoline (AFRIN) 0.05 % nasal spray Place 2 sprays into the nose at bedtime as needed for congestion (allergies).    sodium chloride (OCEAN) 0.65 % nasal spray Place 1 spray into the nose daily as needed for congestion.    tamsulosin (FLOMAX) 0.4 MG CAPS capsule Take 2 capsules (0.8 mg total) by mouth daily.   famotidine (PEPCID) 20 MG tablet Take 1 tablet (20  mg total) by mouth daily. (Patient not taking: Reported on 08/05/2022)   No facility-administered encounter medications on file as of 08/05/2022.    Allergies (verified) Codeine and Sulfamethoxazole-trimethoprim   History: Past Medical History:  Diagnosis Date   Arthritis    BPH (benign prostatic hyperplasia)    nocturia x4 at baseline   H/O hiatal hernia    Hemorrhoids    Hyperlipidemia    Hypertension    Dr Osborne Casco- LOV with clearance 3/13 on chart   IGT (impaired glucose tolerance)    last AIC 5.5- diet controlled- per office note 3/13   SCC (squamous cell carcinoma) 02/10/2005    scc in situ-left forearm.   SCCA (squamous cell carcinoma) of skin 02/10/2005   Right Outer Eye (in situ) (curet and excision)   SCCA (squamous cell carcinoma) of skin 02/13/2006   Right Ear (well diff) (curet and 5FU)   SCCA (squamous cell carcinoma) of skin 04/05/2006   Left Ear Bowl (in situ) (tx p bx)   SCCA (squamous cell carcinoma) of skin 07/06/2010   Right Cheek (in situ) (curet and zyclara)   SCCA (squamous cell carcinoma) of skin 04/27/2009   Left Upper Forearm (in situ) (curet 5FU)   SCCA (squamous cell carcinoma) of skin 08/28/2014   Right Cheek (well diff)   SCCA (squamous cell carcinoma) of skin 03/21/2016   Left Jawline Ant (well diff) (curet and 5FU)   SCCA (squamous cell carcinoma) of skin 01/19/2017   Right Hand (in situ) (tx p bx)   SCCA (squamous cell carcinoma) of skin 03/04/1840   Left Jawline Inf (in situ) (curet and 5FU)   SCCA (squamous cell carcinoma) of skin 12/23/2020   Left Forehead (well diff) (tx p bx)   SCCA (squamous cell carcinoma) of skin 12/23/2020   Left Submandibular Area (in situ) (tx p bx)   Seasonal allergies    Spinal stenosis    Squamous cell carcinoma of skin 08/31/2020   in situ-left jawline cheek   Squamous cell carcinoma of skin 04/27/2021   infil- left nasal sidewall(MOHS)   Squamous cell carcinoma of skin of face 04/27/2021   infil- Left Temporal scalp (MOHS   UTI (lower urinary tract infection)    required admission to [facility]   Past Surgical History:  Procedure Laterality Date   BACK SURGERY  2008   Laminectomy L4-5   JOINT REPLACEMENT Bilateral    bil   KNEE ARTHROSCOPY     right   LUMBAR LAMINECTOMY/DECOMPRESSION MICRODISCECTOMY Bilateral 11/20/2018   Procedure: Bilateral Thoracic Twelve to Lumbar One Laminectomy;  Surgeon: Kristeen Miss, MD;  Location: [place];  Service: Neurosurgery;  Laterality: Bilateral;  posterior   TOTAL HIP ARTHROPLASTY  03/20/2012   Procedure: TOTAL HIP ARTHROPLASTY ANTERIOR APPROACH;  Surgeon:  Mauri Pole, MD;  Location: [facility];  Service: Orthopedics;  Laterality: Left;   Family History  Problem Relation Age of Onset   Arthritis Mother    Lung cancer Father    Asthma Father    Colon cancer Neg Hx    Prostate cancer Neg Hx    Social History   Socioeconomic History   Marital status: Married    Spouse name: Not on file   Number of children: 2   Years of education: Not on file   Highest education level: Not on file  Occupational History   Occupation: retired  Tobacco Use   Smoking status: Never   Smokeless tobacco: Never  Vaping Use   Vaping Use: Never used  Substance and Sexual Activity   Alcohol use: Yes    Comment: socially, wine   Drug use: No   Sexual activity: Not on file  Other Topics Concern   Not on file  Social History Narrative   Plays bridge   [facility] [employer]     2 kids, one local.     [employer] grad.     Married 1962   Prev in Therapist, art, retired.     Social Determinants of Health   Financial Resource Strain: Low Risk  (08/05/2022)   Overall Financial Resource Strain (CARDIA)    Difficulty of Paying Living Expenses: Not hard at all  Food Insecurity: No Food Insecurity (08/05/2022)   Hunger Vital Sign    Worried About Running Out of Food in the Last Year: Never true    Ran Out of Food in the Last Year: Never true  Transportation Needs: No Transportation Needs (08/05/2022)   PRAPARE - Hydrologist (Medical): No    Lack of Transportation (Non-Medical): No  Physical Activity: Insufficiently Active (08/05/2022)   Exercise Vital Sign    Days of Exercise per Week: 3 days    Minutes of Exercise per Session: 30 min  Stress: No Stress Concern Present (08/05/2022)   [place]    Feeling of Stress : Not at all  Social Connections: Moderately Integrated (08/05/2022)   Social Connection and Isolation Panel [NHANES]    Frequency of Communication with  Friends and Family: Twice a week    Frequency of Social Gatherings with Friends and Family: Once a week    Attends Religious Services: More than 4 times per year    Active Member of [employer] or Organizations: No    Attends Music therapist: Never    Marital Status: Married    Tobacco Counseling Counseling given: Not Answered   Clinical Intake:  Pre-visit preparation completed: Yes  Pain : No/denies pain     Nutritional Risks: None Diabetes: No  How often do you need to have someone help you when you read instructions, pamphlets, or other written materials from your doctor or pharmacy?: 1 - Never  Diabetic?no  Interpreter Needed?: No  Information entered by :: Nyshaun Standage,LPN   Activities of Daily Living    08/05/2022    8:21 AM  In your present state of health, do you have any difficulty performing the following activities:  Hearing? 0  Vision? 0  Difficulty concentrating or making decisions? 0  Walking or climbing stairs? 1  Dressing or bathing? 0  Doing errands, shopping? 0  Preparing Food and eating ? N  Using the Toilet? N  In the past six months, have you accidently leaked urine? N  Do you have problems with loss of bowel control? N  Managing your Medications? N  Managing your Finances? N  Housekeeping or managing your Housekeeping? N    Patient Care Team: Tonia Ghent, MD as PCP - General (Family Medicine) Kristeen Miss, MD as Consulting Physician (Neurosurgery) Elsie Saas, MD as Consulting Physician (Orthopedic Surgery) Lavonna Monarch, MD as Consulting Physician (Dermatology) Warren Danes, PA-C as Physician Assistant (Dermatology)  Indicate any recent Medical Services you may have received from other than [facility] providers in the past year (date may be approximate).     Assessment:   This is a routine wellness examination for Todd Gonzales.  Hearing/Vision screen Hearing Screening - Comments:: No aids Vision Screening -  Comments::  Readers- [facility]  Dietary issues and exercise activities discussed: Current Exercise Habits: Home exercise routine, Type of exercise: walking, Time (Minutes): 30, Frequency (Times/Week): 3, Weekly Exercise (Minutes/Week): 90, Intensity: Mild   Goals Addressed             This Visit's Progress    DIET - EAT MORE FRUITS AND VEGETABLES         Depression Screen    08/05/2022    8:19 AM 04/12/2022   12:43 PM  PHQ 2/9 Scores  PHQ - 2 Score 0 0  PHQ- 9 Score 0     Fall Risk    08/05/2022    8:21 AM  Fall Risk   Falls in the past year? 0  Number falls in past yr: 0  Injury with Fall? 0  Risk for fall due to : No Fall Risks  Follow up Falls evaluation completed    FALL RISK PREVENTION PERTAINING TO THE HOME:  Any stairs in or around the home? Yes  If so, are there any without handrails? No  Home free of loose throw rugs in walkways, pet beds, electrical cords, etc? Yes  Adequate lighting in your home to reduce risk of falls? Yes   ASSISTIVE DEVICES UTILIZED TO PREVENT FALLS:  Life alert? No  Use of a cane, walker or w/c? Yes  Grab bars in the bathroom? Yes  Shower chair or bench in shower? Yes  Elevated toilet seat or a handicapped toilet? Yes   Cognitive Function:        08/05/2022    8:22 AM  6CIT Screen  What Year? 0 points  What month? 0 points  What time? 0 points  Count back from 20 0 points  Months in reverse 0 points  Repeat phrase 0 points  Total Score 0 points    Immunizations Immunization History  Administered Date(s) Administered   Fluad Quad(high Dose 65+) 08/27/2020   Influenza-Unspecified 09/17/2019   Moderna Sars-Covid-2 Vaccination 01/01/2020, 02/06/2020, 10/10/2020, 04/17/2021   Pneumococcal Conjugate-13 08/25/2014   Pneumococcal Polysaccharide-23 03/23/2009   Td 02/17/2014   Tdap 10/26/2021   Zoster, Live 03/23/2009    TDAP status: Up to date  Flu Vaccine status: Declined, Education has been provided regarding the importance  of this vaccine but patient still declined. Advised may receive this vaccine at local pharmacy or Health Dept. Aware to provide a copy of the vaccination record if obtained from local pharmacy or Health Dept. Verbalized acceptance and understanding.  Pneumococcal vaccine status: Up to date  Covid-19 vaccine status: Completed vaccines  Qualifies for Shingles Vaccine? Yes   Zostavax completed Yes   Shingrix Completed?: No.    Education has been provided regarding the importance of this vaccine. Patient has been advised to call insurance company to determine out of pocket expense if they have not yet received this vaccine. Advised may also receive vaccine at local pharmacy or Health Dept. Verbalized acceptance and understanding.  Screening Tests Health Maintenance  Topic Date Due   Zoster Vaccines- Shingrix (1 of 2) Never done   COVID-19 Vaccine (5 - Moderna risk series) 08/18/2022 (Originally 06/12/2021)   INFLUENZA VACCINE  08/21/2023 (Originally 07/05/2022)   TETANUS/TDAP  10/27/2031   Pneumonia Vaccine 56+ Years old  Completed   HPV VACCINES  Aged Out    Health Maintenance  Health Maintenance Due  Topic Date Due   Zoster Vaccines- Shingrix (1 of 2) Never done    Colorectal cancer screening: No longer required.  Lung Cancer Screening: (Low Dose CT Chest recommended if Age 60-80 years, 30 pack-year currently smoking OR have quit w/in 15years.) does not qualify.   Additional Screening:  Hepatitis C Screening: does not qualify; Completed no  Vision Screening: Recommended annual ophthalmology exams for early detection of glaucoma and other disorders of the eye. Is the patient up to date with their annual eye exam?  Yes  Who is the provider or what is the name of the office in which the patient attends annual eye exams? [place] If pt is not established with a provider, would they like to be referred to a provider to establish care? No .   Dental Screening: Recommended annual  dental exams for proper oral hygiene  Community Resource Referral / Chronic Care Management: CRR required this visit?  No   CCM required this visit?  No      Plan:     I have personally reviewed and noted the following in the patient's chart:   Medical and social history Use of alcohol, tobacco or illicit drugs  Current medications and supplements including opioid prescriptions. Patient is not currently taking opioid prescriptions. Functional ability and status Nutritional status Physical activity Advanced directives List of other physicians Hospitalizations, surgeries, and ER visits in previous 12 months Vitals Screenings to include cognitive, depression, and falls Referrals and appointments  In addition, I have reviewed and discussed with patient certain preventive protocols, quality metrics, and best practice recommendations. A written personalized care plan for preventive services as well as general preventive health recommendations were provided to patient.     Dionisio David, LPN   0/12/7492   Nurse Notes: none

## 2022-08-10 DIAGNOSIS — M170 Bilateral primary osteoarthritis of knee: Secondary | ICD-10-CM | POA: Diagnosis not present

## 2022-08-17 DIAGNOSIS — M170 Bilateral primary osteoarthritis of knee: Secondary | ICD-10-CM | POA: Diagnosis not present

## 2022-08-19 ENCOUNTER — Ambulatory Visit: Payer: Medicare Other | Admitting: Nurse Practitioner

## 2022-08-19 VITALS — BP 120/62 | HR 82 | Temp 97.3°F | Resp 14 | Wt 248.1 lb

## 2022-08-19 DIAGNOSIS — R350 Frequency of micturition: Secondary | ICD-10-CM | POA: Diagnosis not present

## 2022-08-19 DIAGNOSIS — Z87438 Personal history of other diseases of male genital organs: Secondary | ICD-10-CM | POA: Insufficient documentation

## 2022-08-19 DIAGNOSIS — R8290 Unspecified abnormal findings in urine: Secondary | ICD-10-CM

## 2022-08-19 DIAGNOSIS — N400 Enlarged prostate without lower urinary tract symptoms: Secondary | ICD-10-CM | POA: Diagnosis not present

## 2022-08-19 DIAGNOSIS — R351 Nocturia: Secondary | ICD-10-CM | POA: Diagnosis not present

## 2022-08-19 LAB — POC URINALSYSI DIPSTICK (AUTOMATED)
Bilirubin, UA: NEGATIVE
Blood, UA: NEGATIVE
Glucose, UA: NEGATIVE
Ketones, UA: NEGATIVE
Nitrite, UA: NEGATIVE
Protein, UA: POSITIVE — AB
Spec Grav, UA: 1.02 (ref 1.010–1.025)
Urobilinogen, UA: 0.2 E.U./dL
pH, UA: 5 (ref 5.0–8.0)

## 2022-08-19 MED ORDER — CEPHALEXIN 500 MG PO CAPS: 500.0000 mg | ORAL_CAPSULE | Freq: Two times a day (BID) | ORAL | 0 refills | Status: DC

## 2022-08-19 NOTE — Assessment & Plan Note (Signed)
History of prostatitis at least once a month.  Prostate nontender on exam we will go ahead and treat patient with Keflex 500 mg twice daily for 7 days pending urine culture and PSA.

## 2022-08-19 NOTE — Assessment & Plan Note (Signed)
Abnormal urinalysis today consistent with 2+ leukocytes and protein.  We will send off for urine culture

## 2022-08-19 NOTE — Progress Notes (Signed)
Acute Office Visit  Subjective:     Patient ID: Todd Gonzales, male    DOB: 1941-06-22, 81 y.o.   MRN: [id number]  Chief Complaint  Patient presents with   Urinary Frequency    Started last night. Got up 8 times last night to urinate. Feels lethargic. No pain or burning with urination.      Patient is in today for Urinary frequency   Patinet has a history of BPH and htn. He has also had a remote history of prostatitis. Patient is currently maintain on flomzx 0.8 mg nightly  States that he will usually get up 2 times a night on par and last night got up approx 7-8 times. He is currenlty on HCTZ and took it about lunch time  States that it feels normal today. States that he has had prostatitis in the past. First time was in the 64s. States that he has had a UTI in the past that required admission in the hospital   States last BM was yesterday but that was after he took a bullet suppository.  Patient states that the stool was formed and not overly hard.  Review of Systems  Constitutional:  Negative for chills and fever.  Gastrointestinal:  Negative for abdominal pain, nausea and vomiting.  Genitourinary:  Positive for frequency. Negative for dysuria and hematuria.  Musculoskeletal:  Negative for back pain.        Objective:    BP 120/62   Pulse 82   Temp (!) 97.3 F (36.3 C)   Resp 14   Wt 248 lb 2 oz (112.5 kg)   SpO2 95%   BMI 35.60 kg/m    Physical Exam Vitals and nursing note reviewed. Exam conducted with a chaperone present [facility], RMA).  Constitutional:      Appearance: Normal appearance. He is obese.  Cardiovascular:     Rate and Rhythm: Normal rate and regular rhythm.     Heart sounds: Normal heart sounds.  Pulmonary:     Effort: Pulmonary effort is normal.     Breath sounds: Normal breath sounds.  Abdominal:     General: Bowel sounds are normal. There is no distension.     Palpations: There is no mass.     Tenderness: There is abdominal  tenderness. There is no right CVA tenderness or left CVA tenderness.     Hernia: No hernia is present.  Genitourinary:    Prostate: Not tender.     Rectum: Normal.  Neurological:     Mental Status: He is alert.     Results for orders placed or performed in visit on 08/19/22  POCT Urinalysis Dipstick (Automated)  Result Value Ref Range   Color, UA orange    Clarity, UA clear    Glucose, UA Negative Negative   Bilirubin, UA negative    Ketones, UA negative    Spec Grav, UA 1.020 1.010 - 1.025   Blood, UA negative    pH, UA 5.0 5.0 - 8.0   Protein, UA Positive (A) Negative   Urobilinogen, UA 0.2 0.2 or 1.0 E.U./dL   Nitrite, UA negative    Leukocytes, UA Moderate (2+) (A) Negative        Assessment & Plan:   Problem List Items Addressed This Visit       Genitourinary   BPH (benign prostatic hyperplasia)    Patient currently maintained on tamsulosin 0.8 mg nightly.        Other   Nocturia  Increase in nocturia all of a sudden.  Patient on tamsulosin no change in dosage there.  Patient is on diuretic but he does not take it at bedtime he takes it earlier in the day.      Relevant Orders   PSA   Urinary frequency - Primary   Relevant Orders   POCT Urinalysis Dipstick (Automated) (Completed)   Urine Culture   PSA   Abnormal urinalysis    Abnormal urinalysis today consistent with 2+ leukocytes and protein.  We will send off for urine culture      History of prostatitis    History of prostatitis at least once a month.  Prostate nontender on exam we will go ahead and treat patient with Keflex 500 mg twice daily for 7 days pending urine culture and PSA.      Relevant Orders   PSA    Meds ordered this encounter  Medications   cephALEXin (KEFLEX) 500 MG capsule    Sig: Take 1 capsule (500 mg total) by mouth 2 (two) times daily.    Dispense:  14 capsule    Refill:  0    Order Specific Question:   Supervising Provider    Answer:   TOWER, MARNE A [[id number]     Return if symptoms worsen or fail to improve.  Romilda Garret, NP

## 2022-08-19 NOTE — Patient Instructions (Addendum)
Nice to see you today I will be in touch with the labs and urine once I have them  Follow up if you do not improve  Prostate felt ok on exam

## 2022-08-19 NOTE — Assessment & Plan Note (Signed)
Increase in nocturia all of a sudden.  Patient on tamsulosin no change in dosage there.  Patient is on diuretic but he does not take it at bedtime he takes it earlier in the day.

## 2022-08-19 NOTE — Assessment & Plan Note (Signed)
Patient currently maintained on tamsulosin 0.8 mg nightly.

## 2022-08-20 LAB — URINE CULTURE
MICRO NUMBER:: 13923935
Result:: NO GROWTH
SPECIMEN QUALITY:: ADEQUATE

## 2022-08-20 LAB — PSA: PSA: 3.02 ng/mL (ref <=4.00)

## 2022-08-22 ENCOUNTER — Other Ambulatory Visit: Payer: Self-pay | Admitting: Family Medicine

## 2022-08-22 NOTE — Telephone Encounter (Signed)
Refill request for GABAPENTIN 300 MG CAP  LOV - 08/19/22 Next OV - not scheduled Last refill - 02/22/22 #90/1

## 2022-08-23 ENCOUNTER — Telehealth: Payer: Self-pay | Admitting: Family Medicine

## 2022-08-23 ENCOUNTER — Encounter: Payer: Self-pay | Admitting: Family Medicine

## 2022-08-23 NOTE — Telephone Encounter (Signed)
August 23, 2022 Tonia Ghent, MD to Karel Jarvis "Todd Gonzales"       08/23/22 12:26 PM We will call you about this.  I would not expect sleepiness to be a side effect from the antibiotics.  If you are unable to arouse him or if he has any altered mentation or speech then please take him to the emergency room.  Take care.   Brigitte Pulse  This MyChart message has not been read.       08/23/22 12:20 PM Filbert Berthold, CMA routed this conversation to Tonia Ghent, MD  Karel Jarvis "Todd Gonzales" to [place] (supporting Tonia Ghent, MD)       08/23/22 12:16 PM This is Izora Gala writing about my concern for husband  Todd Gonzales.  For several days  since having  a UTI  on 08/19/22 Todd Gonzales has been sleeping for 3-4 hours in the mornings and all afternoon until I wake him up after another 4 hours of sleep. He sleeps all night and wakes up to use bathroom and back to bed until around 9 am. He has sleep apnea, but this severe sleepiness  is new. I checked the new prescription  Cephalexin for sleepiness as a side effect, and it does not list that on WebMD.    The lab work did not show Prostatitis, but he feels that is what is wrong.   Please advise

## 2022-08-23 NOTE — Telephone Encounter (Signed)
I spoke with pt and his wife (DPR signed); pt and wife notified as instructed by Dr Damita Dunnings and pt and his wife voiced understanding. Since taking abx the frequency of urination has gotten a lot better. Pt has not experienced any slurred speech or difficulty with mental status. Pt has had 2 sleep studies done and the last sleep study was several years ago. Pt was dx with sleep apnea but due to claustrophobia pt does not wear a bipap or c pap mask. Pt has a bed that elevates the head of bed and that helps with sleep apnea per pts wife. Pt having excessive sleepiness, generalized weakness,occasional nausea, and lethargic. Pt first did not think needed an appt but pt and pts wife decided pt did want to see Dr Damita Dunnings; scheduled appt with Dr Damita Dunnings on 08/25/22 at 11:30 with UC & ED precautions and both pt and his wife voiced understanding and appreciative. Sending note to Dr Damita Dunnings and Janett Billow CMA.

## 2022-08-23 NOTE — Telephone Encounter (Signed)
Please see my chart message about patient being sleepy.  Please triage patient.  Thanks.

## 2022-08-23 NOTE — Telephone Encounter (Signed)
Noted. Thanks.

## 2022-08-24 DIAGNOSIS — M170 Bilateral primary osteoarthritis of knee: Secondary | ICD-10-CM | POA: Diagnosis not present

## 2022-08-25 ENCOUNTER — Encounter: Payer: Self-pay | Admitting: Family Medicine

## 2022-08-25 ENCOUNTER — Ambulatory Visit (INDEPENDENT_AMBULATORY_CARE_PROVIDER_SITE_OTHER): Payer: Medicare Other | Admitting: Family Medicine

## 2022-08-25 DIAGNOSIS — M25569 Pain in unspecified knee: Secondary | ICD-10-CM | POA: Diagnosis not present

## 2022-08-25 DIAGNOSIS — R300 Dysuria: Secondary | ICD-10-CM | POA: Diagnosis not present

## 2022-08-25 MED ORDER — CEPHALEXIN 500 MG PO CAPS
500.0000 mg | ORAL_CAPSULE | Freq: Two times a day (BID) | ORAL | 0 refills | Status: DC
Start: 2022-08-25 — End: 2022-09-19

## 2022-08-25 MED ORDER — TRAMADOL HCL 50 MG PO TABS: 50.0000 mg | ORAL_TABLET | Freq: Two times a day (BID) | ORAL | 1 refills | Status: DC | PRN

## 2022-08-25 MED ORDER — HYDROCHLOROTHIAZIDE 12.5 MG PO TABS: 6.2500 mg | ORAL_TABLET | Freq: Every day | ORAL | Status: DC

## 2022-08-25 NOTE — Patient Instructions (Addendum)
Try tramadol for knee pain.  Sedation caution.  Finish the antibiotics.  If symptoms return then restart.  Update me as needed.   Take care.  Glad to see you.

## 2022-08-25 NOTE — Progress Notes (Signed)
Still on keflex, soon to finish the rx.  Most recent OV from 08/19/22 d/w pt.  Sleep is disrupted and that likely contributes to his fatigue.  Last few nights have been better.  Fatigue is not resolved yet.  No fevers.  No burning with urination.  No abd pain.  Not SOB.    He is still dealing with knee pain.  He is taking celebrex and tylenol at baseline.  Ibuprofen helped but nsaid cautions d/w pt.  He can tolerate tramadol.    Meds, vitals, and allergies reviewed.   ROS: Per HPI unless specifically indicated in ROS section   Nad [place] Neck supple, no LA Rrr ctab Abd not ttp.   L knee with mild crepitus.   He has pain from lateral knee down the L lateral shin.

## 2022-08-29 DIAGNOSIS — M170 Bilateral primary osteoarthritis of knee: Secondary | ICD-10-CM | POA: Diagnosis not present

## 2022-08-29 DIAGNOSIS — M25569 Pain in unspecified knee: Secondary | ICD-10-CM | POA: Insufficient documentation

## 2022-08-29 NOTE — Assessment & Plan Note (Signed)
Try tramadol for knee pain.  Sedation caution.  Update me as needed.  He agrees to plan.

## 2022-08-29 NOTE — Assessment & Plan Note (Signed)
I suspect some of his fatigue is related to sleep disruption that should improve his nocturia does not return. Discussed options. Finish current Rx for Keflex.  If symptoms return then restart.  Update me as needed.

## 2022-09-19 ENCOUNTER — Encounter: Payer: Self-pay | Admitting: Family Medicine

## 2022-09-19 ENCOUNTER — Ambulatory Visit (INDEPENDENT_AMBULATORY_CARE_PROVIDER_SITE_OTHER): Payer: Medicare Other | Admitting: Family Medicine

## 2022-09-19 VITALS — BP 120/82 | HR 82 | Temp 97.6°F | Wt 239.0 lb

## 2022-09-19 DIAGNOSIS — Z23 Encounter for immunization: Secondary | ICD-10-CM | POA: Diagnosis not present

## 2022-09-19 DIAGNOSIS — J189 Pneumonia, unspecified organism: Secondary | ICD-10-CM | POA: Diagnosis not present

## 2022-09-19 NOTE — Progress Notes (Unsigned)
Took last dose of augmentin today.    He was on a cruise, the air conditioning went out.  Wife was dx'd with bronchitis.  Then he didn't feel well, was checked on the boat.  Dx'd with PNA, had xray done.  He could feel a rattle on the L lower side.  He didn't have a fever. No vomiting, no diarrhea.  He was tested for flu, negative.  Wasn't tested for covid.  Cough is better now.  Not SOB.  Sputum is better.  Prev "rattle" resolved.  He is done with 10 days of abx.    Meds, vitals, and allergies reviewed.   ROS: Per HPI unless specifically indicated in ROS section   CTAB

## 2022-09-19 NOTE — Patient Instructions (Addendum)
Recheck chest xray in about 1 month, between 2 and 4.   Take care.  Glad to see you. As long as your gradually feel better, then continue as is.  Otherwise update [place].

## 2022-09-21 DIAGNOSIS — J189 Pneumonia, unspecified organism: Secondary | ICD-10-CM | POA: Insufficient documentation

## 2022-09-21 NOTE — Assessment & Plan Note (Signed)
Done with abx and clearly feeling better.  Update me as needed. CTAB.  Recheck chest xray in about 1 month, rationale discussed with patient.  He agrees with plan.  Update [place] as needed in the meantime.

## 2022-10-19 DIAGNOSIS — M170 Bilateral primary osteoarthritis of knee: Secondary | ICD-10-CM | POA: Diagnosis not present

## 2022-10-20 ENCOUNTER — Other Ambulatory Visit: Payer: Self-pay | Admitting: Family Medicine

## 2022-11-09 DIAGNOSIS — H34832 Tributary (branch) retinal vein occlusion, left eye, with macular edema: Secondary | ICD-10-CM | POA: Diagnosis not present

## 2022-11-22 ENCOUNTER — Other Ambulatory Visit: Payer: Self-pay | Admitting: Family Medicine

## 2022-11-23 ENCOUNTER — Ambulatory Visit: Payer: Medicare Other | Admitting: Dermatology

## 2022-11-23 VITALS — BP 157/87

## 2022-11-23 DIAGNOSIS — L853 Xerosis cutis: Secondary | ICD-10-CM | POA: Diagnosis not present

## 2022-11-23 DIAGNOSIS — L570 Actinic keratosis: Secondary | ICD-10-CM | POA: Diagnosis not present

## 2022-11-23 DIAGNOSIS — Z85828 Personal history of other malignant neoplasm of skin: Secondary | ICD-10-CM | POA: Diagnosis not present

## 2022-11-23 DIAGNOSIS — L578 Other skin changes due to chronic exposure to nonionizing radiation: Secondary | ICD-10-CM | POA: Diagnosis not present

## 2022-11-23 MED ORDER — FLUOROURACIL 5 % EX CREA: TOPICAL_CREAM | CUTANEOUS | 1 refills | Status: DC

## 2022-11-23 MED ORDER — CALCIPOTRIENE 0.005 % EX CREA: TOPICAL_CREAM | CUTANEOUS | 0 refills | Status: DC

## 2022-11-23 NOTE — Patient Instructions (Addendum)
Cryotherapy Aftercare  Wash gently with soap and water everyday.   Apply Vaseline and Band-Aid daily until healed.    Recommend starting moisturizer with exfoliant (Urea, Salicylic acid, or Lactic acid) one to two times daily to help smooth rough and bumpy skin.  OTC options include Cetaphil Rough and Bumpy lotion (Urea), Eucerin Roughness Relief lotion or spot treatment cream (Urea), CeraVe SA lotion/cream for Rough and Bumpy skin (Sal Acid), Gold Bond Rough and Bumpy cream (Sal Acid), and AmLactin 12% lotion/cream (Lactic Acid).  If applying in morning, also apply sunscreen to sun-exposed areas, since these exfoliating moisturizers can increase sensitivity to sun.   5-Fluorouracil/Calcipotriene Patient Education   Actinic keratoses are the dry, red scaly spots on the skin caused by sun damage. A portion of these spots can turn into skin cancer with time, and treating them can help prevent development of skin cancer.   Treatment of these spots requires removal of the defective skin cells. There are various ways to remove actinic keratoses, including freezing with liquid nitrogen, treatment with creams, or treatment with a blue light procedure in the office.   5-fluorouracil cream is a topical cream used to treat actinic keratoses. It works by interfering with the growth of abnormal fast-growing skin cells, such as actinic keratoses. These cells peel off and are replaced by healthy ones.   5-fluorouracil/calcipotriene is a combination of the 5-fluorouracil cream with a vitamin D analog cream called calcipotriene. The calcipotriene alone does not treat actinic keratoses. However, when it is combined with 5-fluorouracil, it helps the 5-fluorouracil treat the actinic keratoses much faster so that the same results can be achieved with a much shorter treatment time.  INSTRUCTIONS FOR 5-FLUOROURACIL/CALCIPOTRIENE CREAM:   5-fluorouracil/calcipotriene cream typically only needs to be used for 7 days x  1 week. A thin layer should be applied twice a day to the treatment areas recommended by your physician.   If your physician prescribed you separate tubes of 5-fluourouracil and calcipotriene, apply a thin layer of 5-fluorouracil followed by a thin layer of calcipotriene.   Avoid contact with your eyes, nostrils, and mouth. Do not use 5-fluorouracil/calcipotriene cream on infected or open wounds.   You will develop redness, irritation and some crusting at areas where you have pre-cancer damage/actinic keratoses. IF YOU DEVELOP PAIN, BLEEDING, OR SIGNIFICANT CRUSTING, STOP THE TREATMENT EARLY - you have already gotten a good response and the actinic keratoses should clear up well.  Wash your hands after applying 5-fluorouracil 5% cream on your skin.   A moisturizer or sunscreen with a minimum SPF 30 should be applied each morning.   Once you have finished the treatment, you can apply a thin layer of Vaseline twice a day to irritated areas to soothe and calm the areas more quickly. If you experience significant discomfort, contact your physician.  For some patients it is necessary to repeat the treatment for best results.  SIDE EFFECTS: When using 5-fluorouracil/calcipotriene cream, you may have mild irritation, such as redness, dryness, swelling, or a mild burning sensation. This usually resolves within 2 weeks. The more actinic keratoses you have, the more redness and inflammation you can expect during treatment. Eye irritation has been reported rarely. If this occurs, please let us know.  If you have any trouble using this cream, please call the office. If you have any other questions about this information, please do not hesitate to ask me before you leave the office. Gentle Skin Care Guide  1. Bathe no more than once  a day.  2. Avoid bathing in hot water  3. Use a mild soap like Dove, Vanicream, Cetaphil, CeraVe. Can use Lever 2000 or Cetaphil antibacterial soap  4. Use soap only where  you need it. On most days, use it under your arms, between your legs, and on your feet. Let the water rinse other areas unless visibly dirty.  5. When you get out of the bath/shower, use a towel to gently blot your skin dry, don't rub it.  6. While your skin is still a little damp, apply a moisturizing cream such as Vanicream, CeraVe, Cetaphil, Eucerin, Sarna lotion or plain Vaseline Jelly. For hands apply Neutrogena [place] Hand Cream or Excipial Hand Cream.  7. Reapply moisturizer any time you start to itch or feel dry.  8. Sometimes using free and clear laundry detergents can be helpful. Fabric softener sheets should be avoided. Downy Free & Gentle liquid, or any liquid fabric softener that is free of dyes and perfumes, it acceptable to use  9. If your doctor has given you prescription creams you may apply moisturizers over them     Due to recent changes in healthcare laws, you may see results of your pathology and/or laboratory studies on MyChart before the doctors have had a chance to review them. We understand that in some cases there may be results that are confusing or concerning to you. Please understand that not all results are received at the same time and often the doctors may need to interpret multiple results in order to provide you with the best plan of care or course of treatment. Therefore, we ask that you please give [place] 2 business days to thoroughly review all your results before contacting the office for clarification. Should we see a critical lab result, you will be contacted sooner.   If You Need Anything After Your Visit  If you have any questions or concerns for your doctor, please call our main line at [phone] and press option 4 to reach your doctor's medical assistant. If no one answers, please leave a voicemail as directed and we will return your call as soon as possible. Messages left after 4 pm will be answered the following business day.   You may also send [place]  a message via [place]. We typically respond to MyChart messages within 1-2 business days.  For prescription refills, please ask your pharmacy to contact our office. Our fax number is [phone].  If you have an urgent issue when the clinic is closed that cannot wait until the next business day, you can page your doctor at the number below.    Please note that while we do our best to be available for urgent issues outside of office hours, we are not available 24/7.   If you have an urgent issue and are unable to reach [place], you may choose to seek medical care at your doctor's office, retail clinic, urgent care center, or emergency room.  If you have a medical emergency, please immediately call 911 or go to the emergency department.  Pager Numbers  - Dr. Nehemiah Massed: [phone]  - Dr. Laurence Ferrari: [phone]  - Dr. Nicole Kindred: [phone]  In the event of inclement weather, please call our main line at [phone] for an update on the status of any delays or closures.  Dermatology Medication Tips: Please keep the boxes that topical medications come in in order to help keep track of the instructions about where and how to use these. Pharmacies typically print the medication  instructions only on the boxes and not directly on the medication tubes.   If your medication is too expensive, please contact our office at [phone] option 4 or send [place] a message through [place].   We are unable to tell what your co-pay for medications will be in advance as this is different depending on your insurance coverage. However, we may be able to find a substitute medication at lower cost or fill out paperwork to get insurance to cover a needed medication.   If a prior authorization is required to get your medication covered by your insurance company, please allow [place] 1-2 business days to complete this process.  Drug prices often vary depending on where the prescription is filled and some pharmacies may offer  cheaper prices.  The website www.goodrx.com contains coupons for medications through different pharmacies. The prices here do not account for what the cost may be with help from insurance (it may be cheaper with your insurance), but the website can give you the price if you did not use any insurance.  - You can print the associated coupon and take it with your prescription to the pharmacy.  - You may also stop by our office during regular business hours and pick up a GoodRx coupon card.  - If you need your prescription sent electronically to a different pharmacy, notify our office through [facility] or by phone at [phone] option 4.     Si Usted Necesita Algo Despus de Su Visita  Tambin puede enviarnos un mensaje a travs de Pharmacist, community. Por lo general respondemos a los mensajes de MyChart en el transcurso de 1 a 2 das hbiles.  Para renovar recetas, por favor pida a su farmacia que se ponga en contacto con nuestra oficina. Harland Dingwall de fax es [place] [phone].  Si tiene un asunto urgente cuando la clnica est cerrada y que no puede esperar hasta el siguiente da hbil, puede llamar/localizar a su doctor(a) al nmero que aparece a continuacin.   Por favor, tenga en cuenta que aunque hacemos todo lo posible para estar disponibles para asuntos urgentes fuera del horario de [place], no estamos disponibles las 24 horas del da, los 7 das de la [place].   Si tiene un problema urgente y no puede comunicarse con nosotros, puede optar por buscar atencin mdica  en el consultorio de su doctor(a), en una clnica privada, en un centro de atencin urgente o en una sala de emergencias.  Si tiene Engineering geologist, por favor llame inmediatamente al 911 o vaya a la sala de emergencias.  Nmeros de bper  - Dr. Nehemiah Massed: [phone]  - Dra. Moye: [phone]  - Dra. Nicole Kindred: [phone]  En caso de inclemencias del [place], por favor llame a Johnsie Kindred principal al  [phone] para una actualizacin sobre el [place] de cualquier retraso o cierre.  Consejos para la medicacin en dermatologa: Por favor, guarde las cajas en las que vienen los medicamentos de uso tpico para ayudarle a seguir las instrucciones sobre dnde y cmo usarlos. Las farmacias generalmente imprimen las instrucciones del medicamento slo en las cajas y no directamente en los tubos del [place].   Si su medicamento es muy caro, por favor, pngase en contacto con Zigmund Daniel llamando al [phone] y presione la opcin 4 o envenos un mensaje a travs de Pharmacist, community.   No podemos decirle cul ser su copago por los medicamentos por adelantado ya que esto es diferente dependiendo de la cobertura de su seguro. Sin embargo,  Sin embargo, es posible que podamos encontrar un medicamento sustituto a menor costo o llenar un formulario para que el seguro cubra el medicamento que se considera necesario.   Si se requiere una autorizacin previa para que su compaa de seguros cubra su medicamento, por favor permtanos de 1 a 2 das hbiles para completar este proceso.  Los precios de los medicamentos varan con frecuencia dependiendo del lugar de dnde se surte la receta y alguna farmacias pueden ofrecer precios ms baratos.  El sitio web www.goodrx.com tiene cupones para medicamentos de diferentes farmacias. Los precios aqu no tienen en cuenta lo que podra costar con la ayuda del seguro (puede ser ms barato con su seguro), pero el sitio web puede darle el precio si no utiliz ningn seguro.  - Puede imprimir el cupn correspondiente y llevarlo con su receta a la farmacia.  - Tambin puede pasar por nuestra oficina durante el horario de atencin regular y recoger una tarjeta de cupones de GoodRx.  - Si necesita que su receta se enve electrnicamente a una farmacia diferente, informe a nuestra oficina a travs de MyChart de [place] o por telfono llamando al [phone] y presione la opcin 4.  

## 2022-11-23 NOTE — Progress Notes (Addendum)
New Patient Visit  Subjective  Todd Gonzales is a 81 y.o. male who presents for the following: New Patient (Initial Visit).  New patient presents to establish care. He was a previous patient of Dr Denna Haggard. He has a history of multiple SCCs and Aks treated in the past. He has several spots to check today on the face, scalp, arms. He has self-treated a few spots on his face with 5FU Cream with a good reaction. He has had facial chemical peels (TCA) in the past for Aks. He has Diprolene cream that was given to him for hard areas on his legs.   The following portions of the chart were reviewed this encounter and updated as appropriate:       Review of Systems:  No other skin or systemic complaints except as noted in HPI or Assessment and Plan.  Objective  Well appearing patient in no apparent distress; mood and affect are within normal limits.  A focused examination was performed including face, scalp, arms. Relevant physical exam findings are noted in the Assessment and Plan.  R alar rim x 1, vertex scalp x 2 (3) Pink scaly papules  face, L nasal ala x 1 Multiple pink scaly macules.    Assessment & Plan  Actinic Damage with PreCancerous Actinic Keratoses, Extensive Counseling for Topical Chemotherapy Management: Patient exhibits: - Severe, confluent actinic changes with pre-cancerous actinic keratoses that is secondary to cumulative UV radiation exposure over time - Condition that is severe; chronic, not at goal. - diffuse scaly erythematous macules and papules with underlying dyspigmentation - Discussed Prescription "Field Treatment" topical Chemotherapy for Severe, Chronic Confluent Actinic Changes with Pre-Cancerous Actinic Keratoses Field treatment involves treatment of an entire area of skin that has confluent Actinic Changes (Sun/ Ultraviolet light damage) and PreCancerous Actinic Keratoses by method of PhotoDynamic Therapy (PDT) and/or prescription Topical Chemotherapy agents  such as 5-fluorouracil, 5-fluorouracil/calcipotriene, and/or imiquimod.  The purpose is to decrease the number of clinically evident and subclinical PreCancerous lesions to prevent progression to development of skin cancer by chemically destroying early precancer changes that may or may not be visible.  It has been shown to reduce the risk of developing skin cancer in the treated area. As a result of treatment, redness, scaling, crusting, and open sores may occur during treatment course. One or more than one of these methods may be used and may have to be used several times to control, suppress and eliminate the PreCancerous changes. Discussed treatment course, expected reaction, and possible side effects. - Recommend daily broad spectrum sunscreen SPF 30+ to sun-exposed areas, reapply every 2 hours as needed.  - Staying in the shade or wearing long sleeves, sun glasses (UVA+UVB protection) and wide brim hats (4-inch brim around the entire circumference of the hat) are also recommended. - Call for new or changing lesions. Start 5-fluorouracil/calcipotriene cream twice a day for 7 days to affected areas including face.  Reviewed course of treatment and expected reaction.  Patient advised to expect inflammation and crusting and advised that erosions are possible.  Patient advised to be diligent with sun protection during and after treatment. Counseled to keep medication out of reach of children and pets.   Hypertrophic actinic keratosis (3) R alar rim x 1, vertex scalp x 2  Actinic keratoses are precancerous spots that appear secondary to cumulative UV radiation exposure/sun exposure over time. They are chronic with expected duration over 1 year. A portion of actinic keratoses will progress to squamous cell carcinoma of  the skin. It is not possible to reliably predict which spots will progress to skin cancer and so treatment is recommended to prevent development of skin cancer.  Recommend daily broad  spectrum sunscreen SPF 30+ to sun-exposed areas, reapply every 2 hours as needed.  Recommend staying in the shade or wearing long sleeves, sun glasses (UVA+UVB protection) and wide brim hats (4-inch brim around the entire circumference of the hat). Call for new or changing lesions.  Destruction of lesion - R alar rim x 1, vertex scalp x 2  Destruction method: cryotherapy   Informed consent: discussed and consent obtained   Lesion destroyed using liquid nitrogen: Yes   Region frozen until ice ball extended beyond lesion: Yes   Outcome: patient tolerated procedure well with no complications   Post-procedure details: wound care instructions given   Additional details:  Prior to procedure, discussed risks of blister formation, small wound, skin dyspigmentation, or rare scar following cryotherapy. Recommend Vaseline ointment to treated areas while healing.   AK (actinic keratosis) face, L nasal ala x 1  Actinic keratoses are precancerous spots that appear secondary to cumulative UV radiation exposure/sun exposure over time. They are chronic with expected duration over 1 year. A portion of actinic keratoses will progress to squamous cell carcinoma of the skin. It is not possible to reliably predict which spots will progress to skin cancer and so treatment is recommended to prevent development of skin cancer.  Recommend daily broad spectrum sunscreen SPF 30+ to sun-exposed areas, reapply every 2 hours as needed.  Recommend staying in the shade or wearing long sleeves, sun glasses (UVA+UVB protection) and wide brim hats (4-inch brim around the entire circumference of the hat). Call for new or changing lesions.  Start Efudex cream BID to cheeks x 1 week (Patient will start after Christmas.) Patient has at home, but requests refill since expired. Start Calcipotriene 0.05% Cream Mix with Efudex and apply BID to cheeks x 1 week dsp 60g 0Rf.  May consider PDT treatments vs TCA peels in future.    Destruction of lesion - face, L nasal ala x 1  Destruction method: cryotherapy   Informed consent: discussed and consent obtained   Lesion destroyed using liquid nitrogen: Yes   Region frozen until ice ball extended beyond lesion: Yes   Outcome: patient tolerated procedure well with no complications   Post-procedure details: wound care instructions given   Additional details:  Prior to procedure, discussed risks of blister formation, small wound, skin dyspigmentation, or rare scar following cryotherapy. Recommend Vaseline ointment to treated areas while healing.   calcipotriene (DOVONOX) 0.005 % cream - face, L nasal ala x 1 Apply to cheeks twice a day x 1 week.  fluorouracil (EFUDEX) 5 % cream - face, L nasal ala x 1 Apply to cheeks twice daily x 1 week.     History of Squamous Cell Carcinoma of the Skin, multiple. See History. - No evidence of recurrence today - Recommend regular full body skin exams - Recommend daily broad spectrum sunscreen SPF 30+ to sun-exposed areas, reapply every 2 hours as needed.  - Call if any new or changing lesions are noted between office visits  Xerosis - diffuse xerotic patches - recommend gentle, hydrating skin care - gentle skin care handout given Recommend starting moisturizer with exfoliant (Urea, Salicylic acid, or Lactic acid) one to two times daily to help smooth rough and bumpy skin.  OTC options include Cetaphil Rough and Bumpy lotion (Urea), Eucerin Roughness Relief lotion or  spot treatment cream (Urea), CeraVe SA lotion/cream for Rough and Bumpy skin (Sal Acid), Gold Bond Rough and Bumpy cream (Sal Acid), and AmLactin 12% lotion/cream (Lactic Acid).  If applying in morning, also apply sunscreen to sun-exposed areas, since these exfoliating moisturizers can increase sensitivity to sun. - Samples of AmLactin lotion, CeraVe SA, and Cetaphil Rough & Bumpy given today.  Return in about 4 weeks (around 12/21/2022) for AKs.  IJamesetta Orleans,  CMA, am acting as scribe for Brendolyn Patty, MD .  Documentation: I have reviewed the above documentation for accuracy and completeness, and I agree with the above.  Brendolyn Patty MD

## 2022-12-07 ENCOUNTER — Ambulatory Visit (INDEPENDENT_AMBULATORY_CARE_PROVIDER_SITE_OTHER): Payer: BLUE CROSS/BLUE SHIELD | Admitting: Nurse Practitioner

## 2022-12-07 ENCOUNTER — Encounter: Payer: Self-pay | Admitting: Nurse Practitioner

## 2022-12-07 ENCOUNTER — Ambulatory Visit (INDEPENDENT_AMBULATORY_CARE_PROVIDER_SITE_OTHER)
Admission: RE | Admit: 2022-12-07 | Discharge: 2022-12-07 | Disposition: A | Discharge status: Home / Self Care | Payer: BLUE CROSS/BLUE SHIELD | Source: Ambulatory Visit | Attending: Family Medicine | Admitting: Family Medicine

## 2022-12-07 VITALS — BP 148/80 | HR 77 | Temp 98.2°F | Resp 16 | Ht 70.0 in | Wt 244.0 lb

## 2022-12-07 DIAGNOSIS — J069 Acute upper respiratory infection, unspecified: Secondary | ICD-10-CM | POA: Diagnosis not present

## 2022-12-07 DIAGNOSIS — J189 Pneumonia, unspecified organism: Secondary | ICD-10-CM | POA: Diagnosis not present

## 2022-12-07 MED ORDER — FLUTICASONE PROPIONATE 50 MCG/ACT NA SUSP: 2.0000 | Freq: Every day | NASAL | 0 refills | Status: DC

## 2022-12-07 MED ORDER — CETIRIZINE HCL 10 MG PO TABS: 10.0000 mg | ORAL_TABLET | Freq: Every day | ORAL | 0 refills | Status: DC

## 2022-12-07 NOTE — Progress Notes (Signed)
Acute Office Visit  Subjective:     Patient ID: Todd Gonzales, male    DOB: June 08, 1941, 82 y.o.   MRN: [id number]  Chief Complaint  Patient presents with   Headache   Sore Throat    At night took covid test neg   Fatigue     Patient is in today for sick symptoms  History of HTN, PNA, and SCC  Symptoms started on 11/29/2022 States that his wife has bronchitis RSV vaccine utd Covid vaccines utd Flu shot up to date Covid test two days ago that was negative  States that he has been taking his prescribed medications  Feels like his symptoms are improving    Review of Systems  Constitutional:  Positive for malaise/fatigue. Negative for chills and fever.       Appetite is normal  Fluid intake is low  HENT:  Positive for ear pain and sore throat. Negative for ear discharge and sinus pain.   Respiratory:  Negative for cough and shortness of breath.   Gastrointestinal:  Negative for abdominal pain, diarrhea, nausea and vomiting.  Musculoskeletal:  Negative for myalgias.  Neurological:  Positive for headaches.        Objective:    BP (!) 148/80   Pulse 77   Temp 98.2 F (36.8 C)   Resp 16   Ht '5\' 10"'$  (1.778 m)   Wt 244 lb (110.7 kg)   SpO2 96%   BMI 35.01 kg/m  BP Readings from Last 3 Encounters:  12/07/22 (!) 148/80  11/23/22 (!) 157/87  09/19/22 120/82   Wt Readings from Last 3 Encounters:  12/07/22 244 lb (110.7 kg)  09/19/22 239 lb (108.4 kg)  08/25/22 248 lb (112.5 kg)      Physical Exam Vitals and nursing note reviewed.  Constitutional:      Appearance: He is well-developed.  HENT:     Right Ear: Tympanic membrane, ear canal and external ear normal.     Left Ear: Tympanic membrane, ear canal and external ear normal.     Nose:     Right Sinus: No maxillary sinus tenderness or frontal sinus tenderness.     Left Sinus: No maxillary sinus tenderness or frontal sinus tenderness.     Mouth/Throat:     Mouth: Mucous membranes are moist.      Pharynx: Oropharynx is clear.  Cardiovascular:     Rate and Rhythm: Normal rate and regular rhythm.     Heart sounds: Normal heart sounds.  Pulmonary:     Effort: Pulmonary effort is normal.     Breath sounds: Normal breath sounds.  Lymphadenopathy:     Cervical: No cervical adenopathy.  Neurological:     Mental Status: He is alert.     No results found for any visits on 12/07/22.      Assessment & Plan:   Problem List Items Addressed This Visit       Respiratory   Upper respiratory tract infection - Primary    Patient seems to be improving.  Will send a second-generation antihistamine along with Flonase.  If he does not continue to improve she will reach out to me by Friday via MyChart or call the office.  Then we can consider using some doxycycline if needed.      Relevant Medications   cetirizine (ZYRTEC ALLERGY) 10 MG tablet   fluticasone (FLONASE) 50 MCG/ACT nasal spray    Meds ordered this encounter  Medications   cetirizine (ZYRTEC ALLERGY) 10  MG tablet    Sig: Take 1 tablet (10 mg total) by mouth daily.    Dispense:  30 tablet    Refill:  0    Order Specific Question:   Supervising Provider    Answer:   Glori Bickers MARNE A [[id number]]   fluticasone (FLONASE) 50 MCG/ACT nasal spray    Sig: Place 2 sprays into both nostrils daily.    Dispense:  16 g    Refill:  0    Order Specific Question:   Supervising Provider    Answer:   TOWER, MARNE A [[id number]]    Return if symptoms worsen or fail to improve.  Romilda Garret, NP

## 2022-12-07 NOTE — Assessment & Plan Note (Signed)
Patient seems to be improving.  Will send a second-generation antihistamine along with Flonase.  If he does not continue to improve she will reach out to me by Friday via MyChart or call the office.  Then we can consider using some doxycycline if needed.

## 2022-12-07 NOTE — Patient Instructions (Signed)
Nice to see you today [name] will reach out once he gets the xray and reviews it I have sent in an allergy pill to take at bedtime Start using the flonase nasal spray Follow up if no improvement If you do not continue to improve the rest of the week send  me an email or call the office

## 2022-12-11 ENCOUNTER — Other Ambulatory Visit: Payer: Self-pay | Admitting: Family Medicine

## 2022-12-11 DIAGNOSIS — R9389 Abnormal findings on diagnostic imaging of other specified body structures: Secondary | ICD-10-CM

## 2022-12-13 ENCOUNTER — Telehealth: Payer: Self-pay | Admitting: Family Medicine

## 2022-12-13 NOTE — Telephone Encounter (Signed)
See result note for further documentation.

## 2022-12-13 NOTE — Telephone Encounter (Signed)
Pt returning call to the office requesting a message be left on voicemail

## 2022-12-26 ENCOUNTER — Ambulatory Visit (INDEPENDENT_AMBULATORY_CARE_PROVIDER_SITE_OTHER): Payer: Medicare Other | Admitting: Nurse Practitioner

## 2022-12-26 ENCOUNTER — Ambulatory Visit (INDEPENDENT_AMBULATORY_CARE_PROVIDER_SITE_OTHER)
Admission: RE | Admit: 2022-12-26 | Discharge: 2022-12-26 | Disposition: A | Discharge status: Home / Self Care | Payer: Medicare Other | Source: Ambulatory Visit | Attending: Nurse Practitioner | Admitting: Nurse Practitioner

## 2022-12-26 ENCOUNTER — Encounter: Payer: Self-pay | Admitting: Nurse Practitioner

## 2022-12-26 VITALS — BP 128/88 | HR 87 | Ht 70.0 in | Wt 243.0 lb

## 2022-12-26 DIAGNOSIS — R351 Nocturia: Secondary | ICD-10-CM | POA: Diagnosis not present

## 2022-12-26 DIAGNOSIS — R600 Localized edema: Secondary | ICD-10-CM | POA: Insufficient documentation

## 2022-12-26 DIAGNOSIS — R0602 Shortness of breath: Secondary | ICD-10-CM | POA: Diagnosis not present

## 2022-12-26 DIAGNOSIS — R7309 Other abnormal glucose: Secondary | ICD-10-CM

## 2022-12-26 DIAGNOSIS — R0609 Other forms of dyspnea: Secondary | ICD-10-CM

## 2022-12-26 DIAGNOSIS — R5383 Other fatigue: Secondary | ICD-10-CM

## 2022-12-26 DIAGNOSIS — J9811 Atelectasis: Secondary | ICD-10-CM | POA: Diagnosis not present

## 2022-12-26 DIAGNOSIS — J189 Pneumonia, unspecified organism: Secondary | ICD-10-CM | POA: Diagnosis not present

## 2022-12-26 LAB — POCT URINALYSIS DIPSTICK
Bilirubin, UA: NEGATIVE
Blood, UA: NEGATIVE
Glucose, UA: NEGATIVE
Ketones, UA: NEGATIVE
Leukocytes, UA: NEGATIVE
Nitrite, UA: NEGATIVE
Protein, UA: POSITIVE — AB
Spec Grav, UA: >=1.03 — AB (ref 1.010–1.025)
Urobilinogen, UA: 0.2 E.U./dL — AB
pH, UA: 5.5 (ref 5.0–8.0)

## 2022-12-26 LAB — POCT GLYCOSYLATED HEMOGLOBIN (HGB A1C): Hemoglobin A1C: 5.3 % (ref 4.0–5.6)

## 2022-12-26 NOTE — Assessment & Plan Note (Signed)
Bilateral lower extremity edema 1+ in office.  Patient longer on fluid pill.  No echocardiogram on file.  Check BMP today

## 2022-12-26 NOTE — Assessment & Plan Note (Signed)
Ambiguous in nature.  Last x-ray showed atelectasis versus pneumonia.  Will repeat chest x-ray today.  Check basic labs inclusive of BNP pending result

## 2022-12-26 NOTE — Patient Instructions (Signed)
Nice to see you today I will be in touch with the labs once I have them Follow up if you do not improve

## 2022-12-26 NOTE — Progress Notes (Signed)
Acute Office Visit  Subjective:     Patient ID: AKSH SWART, male    DOB: 1941/12/01, 82 y.o.   MRN: [id number]  Chief Complaint  Patient presents with   Fatigue    W/shob, denies cough/fevers/chills   Blood Sugar Problem    Feels bad after eating sweets    HPI Patient is in today for multiple complaints  Fatigue: patient was recently seen by Renford Dills, MD and CXR done that showed possible atelectasis vs pneumonia. Patient was seen by me on 12/07/2022 and dx with URI. He was on a cruise ship back in October and diagnosed with pneumonia.  Patient was late on getting his follow-up chest x-ray. States that he has felt poor over the past few weeks.  States that he had some dyspnea on exertion and at rest.  States he no longer taking his HCTZ as he pees enough without it.  Has talked with his primary care and continued his benazepril sometimes every other day depending on his blood pressures at home. Patient has mentioned that he had a urinary tract infection/prostatitis in the past.  States about 3 days ago he had episode of nocturia x 6 and has been back to normal since then.  Blood sugar problem: States that he had lunch today and then had a piece of [place] for dessert afterwards he felt bad but has come back around was normal.   Review of Systems  Constitutional:  Negative for chills and fever.  Respiratory:  Positive for cough and shortness of breath (at rest and with movement).   Cardiovascular:  Negative for chest pain.  Gastrointestinal:  Positive for constipation. Negative for abdominal pain, nausea and vomiting.  Genitourinary:  Negative for dysuria, frequency and hematuria.  Musculoskeletal:  Negative for joint pain and myalgias.        Objective:    BP 128/88   Pulse 87   Ht '5\' 10"'$  (1.778 m)   Wt 243 lb (110.2 kg)   SpO2 95%   BMI 34.87 kg/m  BP Readings from Last 3 Encounters:  12/26/22 128/88  12/07/22 (!) 148/80  11/23/22 (!) 157/87   Wt Readings  from Last 3 Encounters:  12/26/22 243 lb (110.2 kg)  12/07/22 244 lb (110.7 kg)  09/19/22 239 lb (108.4 kg)      Physical Exam Vitals and nursing note reviewed.  Constitutional:      Appearance: Normal appearance. He is obese.  Cardiovascular:     Rate and Rhythm: Normal rate and regular rhythm.     Heart sounds: Normal heart sounds.  Pulmonary:     Effort: Pulmonary effort is normal.     Breath sounds: Normal breath sounds.  Abdominal:     General: Bowel sounds are normal. There is distension.     Palpations: There is no mass.     Tenderness: There is no abdominal tenderness.     Hernia: No hernia is present.  Musculoskeletal:     Right lower leg: Edema present.     Left lower leg: Edema present.  Neurological:     Mental Status: He is alert.     Results for orders placed or performed in visit on 12/26/22  HgB A1c  Result Value Ref Range   Hemoglobin A1C 5.3 4.0 - 5.6 %   HbA1c POC (<> result, manual entry)     HbA1c, POC (prediabetic range)     HbA1c, POC (controlled diabetic range)  Assessment & Plan:   Problem List Items Addressed This Visit       Other   Other fatigue - Primary    Ambiguous in nature going on for several weeks.  Will check basic labs inclusive A1c, CBC, CMP, B12, vitamin D and BNP.  Also repeat chest x-ray      Relevant Orders   HgB A1c (Completed)   CBC   Comprehensive metabolic panel   VITAMIN D 25 Hydroxy (Vit-D Deficiency, Fractures)   Vitamin B12   Nocturia    History of the same patient currently on tamsulosin will check UA, pending result      Relevant Orders   HgB A1c (Completed)   POCT urinalysis dipstick   DOE (dyspnea on exertion)    Ambiguous in nature.  Last x-ray showed atelectasis versus pneumonia.  Will repeat chest x-ray today.  Check basic labs inclusive of BNP pending result      Relevant Orders   DG Chest 2 View   Brain natriuretic peptide   Localized edema    Bilateral lower extremity edema 1+  in office.  Patient longer on fluid pill.  No echocardiogram on file.  Check BMP today      Relevant Orders   Brain natriuretic peptide    No orders of the defined types were placed in this encounter.   Return if symptoms worsen or fail to improve.  Romilda Garret, NP

## 2022-12-26 NOTE — Assessment & Plan Note (Signed)
History of the same patient currently on tamsulosin will check UA, pending result

## 2022-12-26 NOTE — Assessment & Plan Note (Signed)
Ambiguous in nature going on for several weeks.  Will check basic labs inclusive A1c, CBC, CMP, B12, vitamin D and BNP.  Also repeat chest x-ray

## 2022-12-27 LAB — CBC
HCT: 45.4 % (ref 39.0–52.0)
Hemoglobin: 15.3 g/dL (ref 13.0–17.0)
MCHC: 33.8 g/dL (ref 30.0–36.0)
MCV: 97.6 fl (ref 78.0–100.0)
Platelets: 199 10*3/uL (ref 150.0–400.0)
RBC: 4.66 Mil/uL (ref 4.22–5.81)
RDW: 14.4 % (ref 11.5–15.5)
WBC: 6.6 10*3/uL (ref 4.0–10.5)

## 2022-12-27 LAB — COMPREHENSIVE METABOLIC PANEL
ALT: 17 U/L (ref 0–53)
AST: 17 U/L (ref 0–37)
Albumin: 4 g/dL (ref 3.5–5.2)
Alkaline Phosphatase: 43 U/L (ref 39–117)
BUN: 17 mg/dL (ref 6–23)
CO2: 29 mEq/L (ref 19–32)
Calcium: 8.5 mg/dL (ref 8.4–10.5)
Chloride: 104 mEq/L (ref 96–112)
Creatinine, Ser: 0.89 mg/dL (ref 0.40–1.50)
GFR: 79.01 mL/min (ref >60.00)
Glucose, Bld: 109 mg/dL — ABNORMAL HIGH (ref 70–99)
Potassium: 4 mEq/L (ref 3.5–5.1)
Sodium: 141 mEq/L (ref 135–145)
Total Bilirubin: 0.9 mg/dL (ref 0.2–1.2)
Total Protein: 6.2 g/dL (ref 6.0–8.3)

## 2022-12-27 LAB — VITAMIN D 25 HYDROXY (VIT D DEFICIENCY, FRACTURES): VITD: 27.15 ng/mL — ABNORMAL LOW (ref 30.00–100.00)

## 2022-12-27 LAB — BRAIN NATRIURETIC PEPTIDE: Brain Natriuretic Peptide: 50 pg/mL (ref <100)

## 2022-12-27 LAB — VITAMIN B12: Vitamin B-12: 142 pg/mL — ABNORMAL LOW (ref 211–911)

## 2022-12-28 ENCOUNTER — Other Ambulatory Visit: Payer: Self-pay | Admitting: Family Medicine

## 2022-12-28 DIAGNOSIS — E538 Deficiency of other specified B group vitamins: Secondary | ICD-10-CM

## 2022-12-28 DIAGNOSIS — E559 Vitamin D deficiency, unspecified: Secondary | ICD-10-CM

## 2022-12-29 ENCOUNTER — Other Ambulatory Visit: Payer: Self-pay | Admitting: Nurse Practitioner

## 2022-12-29 DIAGNOSIS — J069 Acute upper respiratory infection, unspecified: Secondary | ICD-10-CM

## 2023-01-02 ENCOUNTER — Telehealth: Payer: Self-pay

## 2023-01-02 NOTE — Telephone Encounter (Signed)
[facility] Night - Client Nonclinical Telephone Record  [facility] Client [facility] Night - Client Client Site [place] Provider Owens Loffler - MD Contact Type Call Who Is Calling Patient / Member / Family / Caregiver Caller Name [place] Phone Number [phone] Patient Name Todd Gonzales Patient DOB 09-15-1941 Call Type Message Only Information Provided Reason for Call Request for General Office Information Initial Comment Caller states he would like to get his Cortisone shot at the office? Could the office please contact the patient? Additional Comment Please contact [name] who has a question about his appointment on 01/23/2023. Disp. Time Disposition Final User 12/31/2022 9:41:40 AM General Information Provided Yes King-Hussey, Berdi Call Closed By: Bonnita Nasuti Transaction Date/Time: 12/31/2022 9:37:02 AM (ET

## 2023-01-02 NOTE — Telephone Encounter (Signed)
Patient appt already scheduled.

## 2023-01-03 ENCOUNTER — Ambulatory Visit: Payer: Medicare Other | Admitting: Dermatology

## 2023-01-03 ENCOUNTER — Encounter: Payer: Self-pay | Admitting: Dermatology

## 2023-01-03 VITALS — BP 122/78 | HR 73

## 2023-01-03 DIAGNOSIS — L578 Other skin changes due to chronic exposure to nonionizing radiation: Secondary | ICD-10-CM | POA: Diagnosis not present

## 2023-01-03 DIAGNOSIS — L570 Actinic keratosis: Secondary | ICD-10-CM | POA: Diagnosis not present

## 2023-01-03 DIAGNOSIS — L820 Inflamed seborrheic keratosis: Secondary | ICD-10-CM | POA: Diagnosis not present

## 2023-01-03 DIAGNOSIS — L821 Other seborrheic keratosis: Secondary | ICD-10-CM

## 2023-01-03 NOTE — Patient Instructions (Addendum)
Photodynamic Therapy/Red Light Therapy  Actinic keratoses are the dry, red scaly spots on the skin caused by sun damage. A portion of these spots can turn into skin cancer with time, and treating them can help prevent development of skin cancer.   Treatment of these spots requires removal of the defective skin cells. There are various ways to remove actinic keratoses, including freezing with liquid nitrogen, treatment with creams, or treatment with a blue light procedure in the office.   Photodynamic Therapy (PDT), also known as Red light therapy" is an in office procedure used to treat actinic keratoses. It works by targeting precancerous cells. After treatment, these cells peel off and are replaced by healthy ones.   For your phototherapy appointment, you will have two appointments on the day of your treatment. The first appointment will be to apply a cream to the treatment area. You will leave this cream on for 1-2 hours depending on the area being treated. The second appointment will be to shine a blue light on the area for 16 minutes to kill off the precancer cells. It is common to experience a burning sensation during the treatment.  After your treatment, it will be important to keep the treated areas of skin out of the sun completely for 48-72 hours (2-3 days) to prevent having a reaction.   Common side effects include: - Burning or stinging, which may be severe and can last up to 24-72 hours after your treatment - Scaling and crusting which may last up to 2 weeks - Redness, swelling and/or peeling which can last up to 4 weeks  To Care for Your Skin After PDT/Red light Therapy: - Wash with soap, water and shampoo as normal. - If needed, you can use cold compresses (e.g. ice packs) for comfort - If okay with your primary care doctor, you may use analgesics such as acetaminophen (tylenol) every 4-6 hours, not to exceed recommended dose - You may apply Cerave Healing Ointment, Vaseline  or Aquaphor as needed - If you have a lot of swelling you may take a Benadryl to help with this (this may cause drowsiness), not to exceed recommended dose. This may increase the risk of falls in people over 65 and may slow reaction time while driving, so it is not recommended to take before driving or operating machinery. - Sun Precautions - Wear a wide brim hat for the next week if outside  - Wear a sunblock with zinc or titanium dioxide at least SPF 50 daily  If you have any questions or concerns, please call the office and ask to speak with a nurse.   --------------------------------------------------------------------------------------------------------------   Can use once healed - Start 5-fluorouracil/calcipotriene cream twice a day for 5 - 7 days to affected areas including back of hands  Reviewed course of treatment and expected reaction.  Patient advised to expect inflammation and crusting and advised that erosions are possible.  Patient advised to be diligent with sun protection during and after treatment. Counseled to keep medication out of reach of children and pets.     Actinic keratoses are precancerous spots that appear secondary to cumulative UV radiation exposure/sun exposure over time. They are chronic with expected duration over 1 year. A portion of actinic keratoses will progress to squamous cell carcinoma of the skin. It is not possible to reliably predict which spots will progress to skin cancer and so treatment is recommended to prevent development of skin cancer.  Recommend daily broad spectrum sunscreen SPF  30+ to sun-exposed areas, reapply every 2 hours as needed.  Recommend staying in the shade or wearing long sleeves, sun glasses (UVA+UVB protection) and wide brim hats (4-inch brim around the entire circumference of the hat). Call for new or changing lesions.    Cryotherapy Aftercare  Wash gently with soap and water everyday.   Apply Vaseline and Band-Aid daily  until healed.     Seborrheic Keratosis  What causes seborrheic keratoses? Seborrheic keratoses are harmless, common skin growths that first appear during adult life.  As time goes by, more growths appear.  Some people may develop a large number of them.  Seborrheic keratoses appear on both covered and uncovered body parts.  They are not caused by sunlight.  The tendency to develop seborrheic keratoses can be inherited.  They vary in color from skin-colored to gray, brown, or even black.  They can be either smooth or have a rough, warty surface.   Seborrheic keratoses are superficial and look as if they were stuck on the skin.  Under the microscope this type of keratosis looks like layers upon layers of skin.  That is why at times the top layer may seem to fall off, but the rest of the growth remains and re-grows.    Treatment Seborrheic keratoses do not need to be treated, but can easily be removed in the office.  Seborrheic keratoses often cause symptoms when they rub on clothing or jewelry.  Lesions can be in the way of shaving.  If they become inflamed, they can cause itching, soreness, or burning.  Removal of a seborrheic keratosis can be accomplished by freezing, burning, or surgery. If any spot bleeds, scabs, or grows rapidly, please return to have it checked, as these can be an indication of a skin cancer.    Due to recent changes in healthcare laws, you may see results of your pathology and/or laboratory studies on MyChart before the doctors have had a chance to review them. We understand that in some cases there may be results that are confusing or concerning to you. Please understand that not all results are received at the same time and often the doctors may need to interpret multiple results in order to provide you with the best plan of care or course of treatment. Therefore, we ask that you please give [place] 2 business days to thoroughly review all your results before contacting the office  for clarification. Should we see a critical lab result, you will be contacted sooner.   If You Need Anything After Your Visit  If you have any questions or concerns for your doctor, please call our main line at [phone] and press option 4 to reach your doctor's medical assistant. If no one answers, please leave a voicemail as directed and we will return your call as soon as possible. Messages left after 4 pm will be answered the following business day.   You may also send [place] a message via [place]. We typically respond to MyChart messages within 1-2 business days.  For prescription refills, please ask your pharmacy to contact our office. Our fax number is [phone].  If you have an urgent issue when the clinic is closed that cannot wait until the next business day, you can page your doctor at the number below.    Please note that while we do our best to be available for urgent issues outside of office hours, we are not available 24/7.   If you have an urgent issue and  are unable to reach [place], you may choose to seek medical care at your doctor's office, retail clinic, urgent care center, or emergency room.  If you have a medical emergency, please immediately call 911 or go to the emergency department.  Pager Numbers  - Dr. Nehemiah Massed: [phone]  - Dr. Laurence Ferrari: [phone]  - Dr. Nicole Kindred: [phone]  In the event of inclement weather, please call our main line at [phone] for an update on the status of any delays or closures.  Dermatology Medication Tips: Please keep the boxes that topical medications come in in order to help keep track of the instructions about where and how to use these. Pharmacies typically print the medication instructions only on the boxes and not directly on the medication tubes.   If your medication is too expensive, please contact our office at [phone] option 4 or send [place] a message through [place].   We are unable to tell what your co-pay for  medications will be in advance as this is different depending on your insurance coverage. However, we may be able to find a substitute medication at lower cost or fill out paperwork to get insurance to cover a needed medication.   If a prior authorization is required to get your medication covered by your insurance company, please allow [place] 1-2 business days to complete this process.  Drug prices often vary depending on where the prescription is filled and some pharmacies may offer cheaper prices.  The website www.goodrx.com contains coupons for medications through different pharmacies. The prices here do not account for what the cost may be with help from insurance (it may be cheaper with your insurance), but the website can give you the price if you did not use any insurance.  - You can print the associated coupon and take it with your prescription to the pharmacy.  - You may also stop by our office during regular business hours and pick up a GoodRx coupon card.  - If you need your prescription sent electronically to a different pharmacy, notify our office through [facility] or by phone at [phone] option 4.     Si Usted Necesita Algo Despus de Su Visita  Tambin puede enviarnos un mensaje a travs de Pharmacist, community. Por lo general respondemos a los mensajes de MyChart en el transcurso de 1 a 2 das hbiles.  Para renovar recetas, por favor pida a su farmacia que se ponga en contacto con nuestra oficina. Harland Dingwall de fax es [place] [phone].  Si tiene un asunto urgente cuando la clnica est cerrada y que no puede esperar hasta el siguiente da hbil, puede llamar/localizar a su doctor(a) al nmero que aparece a continuacin.   Por favor, tenga en cuenta que aunque hacemos todo lo posible para estar disponibles para asuntos urgentes fuera del horario de [place], no estamos disponibles las 24 horas del da, los 7 das de la [place].   Si tiene un problema urgente y no puede  comunicarse con nosotros, puede optar por buscar atencin mdica  en el consultorio de su doctor(a), en una clnica privada, en un centro de atencin urgente o en una sala de emergencias.  Si tiene Engineering geologist, por favor llame inmediatamente al 911 o vaya a la sala de emergencias.  Nmeros de bper  - Dr. Nehemiah Massed: [phone]  - Dra. Moye: [phone]  - Dra. Nicole Kindred: [phone]  En caso de inclemencias del [place], por favor llame a nuestra lnea principal al [phone] para una actualizacin [employer]  estado de cualquier retraso o cierre.  Consejos para la medicacin en dermatologa: Por favor, guarde las cajas en las que vienen los medicamentos de uso tpico para ayudarle a seguir las instrucciones sobre dnde y cmo usarlos. Las farmacias generalmente imprimen las instrucciones del medicamento slo en las cajas y no directamente en los tubos del [place].   Si su medicamento es muy caro, por favor, pngase en contacto con Zigmund Daniel llamando al [phone] y presione la opcin 4 o envenos un mensaje a travs de Pharmacist, community.   No podemos decirle cul ser su copago por los medicamentos por adelantado ya que esto es diferente dependiendo de la cobertura de su seguro. Sin embargo, es posible que podamos encontrar un medicamento sustituto a Electrical engineer un formulario para que el seguro cubra el medicamento que se considera necesario.   Si se requiere una autorizacin previa para que su compaa de seguros [place] su medicamento, por favor permtanos de 1 a 2 das hbiles para completar este proceso.  Los precios de los medicamentos varan con frecuencia dependiendo del Environmental consultant de dnde se surte la receta y alguna farmacias pueden ofrecer precios ms baratos.  El sitio web www.goodrx.com tiene cupones para medicamentos de Airline pilot. Los precios aqu no tienen en cuenta lo que podra costar con la ayuda del seguro (puede ser ms barato con su seguro), pero  el sitio web puede darle el precio si no utiliz Research scientist (physical sciences).  - Puede imprimir el cupn correspondiente y llevarlo con su receta a la farmacia.  - Tambin puede pasar por nuestra oficina durante el horario de atencin regular y Charity fundraiser una tarjeta de cupones de GoodRx.  - Si necesita que su receta se enve electrnicamente a una farmacia diferente, informe a nuestra oficina a travs de MyChart de [place] o por telfono llamando al [phone] y presione la opcin 4.

## 2023-01-03 NOTE — Progress Notes (Signed)
Follow-Up Visit   Subjective  Todd Gonzales is a 82 y.o. male who presents for the following: Actinic Keratosis (4 week follow up, reports some rough areas at right temple and left forearm which are irritating. Has used 5 f/u spot treatment cream to areas at face. ).  The patient has spots, moles and lesions to be evaluated, some may be new or changing and the patient has concerns that these could be cancer.    The following portions of the chart were reviewed this encounter and updated as appropriate:      Review of Systems: No other skin or systemic complaints except as noted in HPI or Assessment and Plan.   Objective  Well appearing patient in no apparent distress; mood and affect are within normal limits.  A focused examination was performed including face, nose, b/l hands and b/l arms. Relevant physical exam findings are noted in the Assessment and Plan.  right cheek x 6, right forehead x 4, upper nasal dorsum x 1, left nasal tip x 1,  upper lip x 3, left forehead x 3, left cheek x 10, left forearm x 4, left hand dorsum x 2, right  hand dorum x 4, right forearm x 2 (40) Erythematous thin papules/macules with gritty scale.   left upper elbow x 1, right temple x 1, right forearm x 1 (3) Erythematous stuck-on, waxy papule   Assessment & Plan  Actinic keratosis (40) right cheek x 6, right forehead x 4, upper nasal dorsum x 1, left nasal tip x 1,  upper lip x 3, left forehead x 3, left cheek x 10, left forearm x 4, left hand dorsum x 2, right  hand dorum x 4, right forearm x 2  Severe, extensive with hypertrophic Aks, Will schedule red light pdt treatment with debridement of keratotic skin of face  Hypertrophic Ak at right cheek, upper nasal dorsum, R hand dorsum. Recheck on f/up  Actinic keratoses are precancerous spots that appear secondary to cumulative UV radiation exposure/sun exposure over time. They are chronic with expected duration over 1 year. A portion of actinic  keratoses will progress to squamous cell carcinoma of the skin. It is not possible to reliably predict which spots will progress to skin cancer and so treatment is recommended to prevent development of skin cancer.  Recommend daily broad spectrum sunscreen SPF 30+ to sun-exposed areas, reapply every 2 hours as needed.  Recommend staying in the shade or wearing long sleeves, sun glasses (UVA+UVB protection) and wide brim hats (4-inch brim around the entire circumference of the hat). Call for new or changing lesions.  Destruction of lesion - right cheek x 6, right forehead x 4, upper nasal dorsum x 1, left nasal tip x 1,  upper lip x 3, left forehead x 3, left cheek x 10, left forearm x 4, left hand dorsum x 2, right  hand dorum x 4, right forearm x 2  Destruction method: cryotherapy   Informed consent: discussed and consent obtained   Lesion destroyed using liquid nitrogen: Yes   Region frozen until ice ball extended beyond lesion: Yes   Outcome: patient tolerated procedure well with no complications   Post-procedure details: wound care instructions given   Additional details:  Prior to procedure, discussed risks of blister formation, small wound, skin dyspigmentation, or rare scar following cryotherapy. Recommend Vaseline ointment to treated areas while healing.   Inflamed seborrheic keratosis (3) left upper elbow x 1, right temple x 1, right forearm x  1  Symptomatic, irritating, patient would like treated.  Destruction of lesion - left upper elbow x 1, right temple x 1, right forearm x 1  Destruction method: cryotherapy   Informed consent: discussed and consent obtained   Lesion destroyed using liquid nitrogen: Yes   Region frozen until ice ball extended beyond lesion: Yes   Outcome: patient tolerated procedure well with no complications   Post-procedure details: wound care instructions given   Additional details:  Prior to procedure, discussed risks of blister formation, small wound,  skin dyspigmentation, or rare scar following cryotherapy. Recommend Vaseline ointment to treated areas while healing.    Seborrheic Keratoses Right forearm  - Stuck-on, waxy, tan-brown papules and/or plaques  - Benign-appearing - Discussed benign etiology and prognosis. - Observe - Call for any changes   Actinic Damage with PreCancerous Actinic Keratoses Counseling for Topical Chemotherapy Management: Patient exhibits: - Severe, confluent actinic changes with pre-cancerous actinic keratoses that is secondary to cumulative UV radiation exposure over time - Condition that is severe; chronic, not at goal. - diffuse scaly erythematous macules and papules with underlying dyspigmentation - Discussed Prescription "Field Treatment" topical Chemotherapy for Severe, Chronic Confluent Actinic Changes with Pre-Cancerous Actinic Keratoses Field treatment involves treatment of an entire area of skin that has confluent Actinic Changes (Sun/ Ultraviolet light damage) and PreCancerous Actinic Keratoses by method of PhotoDynamic Therapy (PDT) and/or prescription Topical Chemotherapy agents such as 5-fluorouracil, 5-fluorouracil/calcipotriene, and/or imiquimod.  The purpose is to decrease the number of clinically evident and subclinical PreCancerous lesions to prevent progression to development of skin cancer by chemically destroying early precancer changes that may or may not be visible.  It has been shown to reduce the risk of developing skin cancer in the treated area. As a result of treatment, redness, scaling, crusting, and open sores may occur during treatment course. One or more than one of these methods may be used and may have to be used several times to control, suppress and eliminate the PreCancerous changes. Discussed treatment course, expected reaction, and possible side effects. - Recommend daily broad spectrum sunscreen SPF 30+ to sun-exposed areas, reapply every 2 hours as needed.  - Staying in the  shade or wearing long sleeves, sun glasses (UVA+UVB protection) and wide brim hats (4-inch brim around the entire circumference of the hat) are also recommended. - Call for new or changing lesions..  - Will schedule Red light photodynamic therapy with debridement to the face   Return in about 3 months (around 04/04/2023) for ak followup, sooner for PDT face.  I, Ruthell Rummage, CMA, am acting as scribe for Brendolyn Patty, MD.  Documentation: I have reviewed the above documentation for accuracy and completeness, and I agree with the above.  Brendolyn Patty MD

## 2023-01-04 ENCOUNTER — Ambulatory Visit: Payer: Medicare Other

## 2023-01-04 ENCOUNTER — Ambulatory Visit (INDEPENDENT_AMBULATORY_CARE_PROVIDER_SITE_OTHER): Payer: Medicare Other | Admitting: *Deleted

## 2023-01-04 DIAGNOSIS — E538 Deficiency of other specified B group vitamins: Secondary | ICD-10-CM | POA: Diagnosis not present

## 2023-01-04 MED ORDER — CYANOCOBALAMIN 1000 MCG/ML IJ SOLN
1000.0000 ug | Freq: Once | INTRAMUSCULAR | Status: AC
  Administered 2023-01-04: 1000 ug via INTRAMUSCULAR

## 2023-01-04 NOTE — Progress Notes (Signed)
Per orders of Dr. Gutierrez, injection of Vitamin B-12 given by Adrijana Haros. Patient tolerated injection well. 

## 2023-01-13 ENCOUNTER — Other Ambulatory Visit: Payer: BLUE CROSS/BLUE SHIELD

## 2023-01-18 ENCOUNTER — Ambulatory Visit (INDEPENDENT_AMBULATORY_CARE_PROVIDER_SITE_OTHER): Payer: Medicare Other | Admitting: *Deleted

## 2023-01-18 DIAGNOSIS — E538 Deficiency of other specified B group vitamins: Secondary | ICD-10-CM

## 2023-01-18 DIAGNOSIS — M170 Bilateral primary osteoarthritis of knee: Secondary | ICD-10-CM | POA: Diagnosis not present

## 2023-01-18 MED ORDER — CYANOCOBALAMIN 1000 MCG/ML IJ SOLN
1000.0000 ug | Freq: Once | INTRAMUSCULAR | Status: AC
  Administered 2023-01-18: 1000 ug via INTRAMUSCULAR

## 2023-01-18 NOTE — Progress Notes (Signed)
Per orders of Dr. Gutierrez, injection of Vitamin B-12 given by Zakir Henner. Patient tolerated injection well. 

## 2023-01-22 NOTE — Progress Notes (Signed)
Kahlil Cowans T. Tuck Dulworth, MD, CAQ Sports Medicine [facility] [address] [place], [address]  Phone: [phone]  FAX: [phone]  Todd Gonzales - 82 y.o. male  MRN [id number]  Date of Birth: 1941-04-05  Date: 01/23/2023  PCP: Joaquim Nam, MD  Referral: Joaquim Nam, MD  Chief Complaint  Patient presents with   Knee Pain   Subjective:   Todd Gonzales is a 82 y.o. very pleasant male patient with Body mass index is 34.58 kg/m. who presents with the following:  The patient presents with bilateral knee osteoarthritis and pain.  On a knee maintenance program.  Has been going to [facility] for knee injections.  Uses Synvisc every 6 months.   Long term M-W patient, moving to [facility].    See my dictation below.   Review of Systems is noted in the HPI, as appropriate  Objective:   BP 110/60   Pulse 87   Temp 97.9 F (36.6 C) (Temporal)   Ht 5\' 10"  (1.778 m)   Wt 241 lb (109.3 kg)   SpO2 96%   BMI 34.58 kg/m   GEN: No acute distress; alert,appropriate. PULM: Breathing comfortably in no respiratory distress PSYCH: Normally interactive.   Laboratory and Imaging Data:  Assessment and Plan:     ICD-10-CM   1. Primary osteoarthritis of knees, bilateral  M17.0      Total encounter time: 10 minutes. This includes total time spent on the day of encounter.  He wanted to come in and talk to me about arthritis.  He has been a long-term [facility] patient, and in his retirement he and his wife are moving to [facility].  He gets Synvisc injections about twice a year and he has intermittently some corticosteroid injections.  He wanted to see if I could do these for he since he is moving a lot closer.  I think this is entirely reasonable, and I do all of these procedures all the time.  I reassured him, and I am happy to see him in the future.  Disposition: prn  [employer] One speech-to-text software was used for  transcription in this dictation.  Possible transcriptional errors can occur using Animal nutritionist.   Signed,  Elpidio Galea. Warden Buffa, MD   Outpatient Encounter Medications as of 01/23/2023  Medication Sig   acetaminophen (TYLENOL) 325 MG tablet Take 650 mg by mouth every 6 (six) hours as needed.   benazepril (LOTENSIN) 10 MG tablet Take 10 mg by mouth daily.   calcipotriene (DOVONOX) 0.005 % cream Apply to cheeks twice a day x 1 week.   celecoxib (CELEBREX) 200 MG capsule TAKE 1 CAPSULE BY MOUTH TWICE DAILY   cetirizine (ZYRTEC ALLERGY) 10 MG tablet Take 1 tablet (10 mg total) by mouth daily.   Cholecalciferol (VITAMIN D-1000 MAX ST) 25 MCG (1000 UT) tablet Take 2 tablets (2,000 Units total) by mouth daily.   clobetasol ointment (TEMOVATE) 0.05 % APPLY TO AFFECTED AREAS ON BOTH LEGS TWICE DAILY AFTER SHOWER   diclofenac Sodium (VOLTAREN) 1 % GEL Apply to affected joints qid prn   emollient (BIAFINE) cream Apply topically as needed.   fluorouracil (EFUDEX) 5 % cream Apply to cheeks twice daily x 1 week.   fluticasone (FLONASE) 50 MCG/ACT nasal spray Place 2 sprays into both nostrils daily.   gabapentin (NEURONTIN) 300 MG capsule TAKE 1 CAPSULE BY MOUTH ONCE DAILY   hydrochlorothiazide (HYDRODIURIL) 12.5 MG tablet Take 0.5 tablets (  6.25 mg total) by mouth daily.   hydrocortisone (PROCTOSOL HC) 2.5 % rectal cream Place 1 application. rectally 2 (two) times daily.   oxymetazoline (AFRIN) 0.05 % nasal spray Place 2 sprays into the nose at bedtime as needed for congestion (allergies).    sodium chloride (OCEAN) 0.65 % nasal spray Place 1 spray into the nose daily as needed for congestion.    tamsulosin (FLOMAX) 0.4 MG CAPS capsule TAKE 2 CAPSULE BY MOUTH DAILY   traMADol (ULTRAM) 50 MG tablet Take 1 tablet (50 mg total) by mouth every 12 (twelve) hours as needed.   No facility-administered encounter medications on file as of 01/23/2023.

## 2023-01-23 ENCOUNTER — Ambulatory Visit (INDEPENDENT_AMBULATORY_CARE_PROVIDER_SITE_OTHER): Payer: Medicare Other | Admitting: Family Medicine

## 2023-01-23 ENCOUNTER — Encounter: Payer: Self-pay | Admitting: Family Medicine

## 2023-01-23 VITALS — BP 110/60 | HR 87 | Temp 97.9°F | Ht 70.0 in | Wt 241.0 lb

## 2023-01-23 DIAGNOSIS — M170 Bilateral primary osteoarthritis of knee: Secondary | ICD-10-CM

## 2023-01-26 ENCOUNTER — Encounter: Payer: Self-pay | Admitting: Family Medicine

## 2023-01-26 ENCOUNTER — Ambulatory Visit (INDEPENDENT_AMBULATORY_CARE_PROVIDER_SITE_OTHER): Payer: Medicare Other | Admitting: Family Medicine

## 2023-01-26 VITALS — BP 122/80 | HR 76 | Temp 97.6°F | Ht 70.0 in | Wt 237.0 lb

## 2023-01-26 DIAGNOSIS — E538 Deficiency of other specified B group vitamins: Secondary | ICD-10-CM

## 2023-01-26 DIAGNOSIS — R634 Abnormal weight loss: Secondary | ICD-10-CM

## 2023-01-26 MED ORDER — CYANOCOBALAMIN 1000 MCG/ML IJ SOLN: 1000.0000 ug | INTRAMUSCULAR | Status: AC

## 2023-01-26 NOTE — Patient Instructions (Signed)
Don't change your meds for now.  Go to the lab on the way out.   If you have mychart we'll likely use that to update you.    Take care.  Glad to see you.

## 2023-01-26 NOTE — Progress Notes (Signed)
Recently started on B12 IM.  D/w pt.  He feels like he has a little more energy in the meantime.    He was asking about muscle loss in his legs, asking about testosterone level, etc.  Discussed routine cautions with testosterone supplementation.  Recent weight loss noted, 249--->237 over the last ~6 months.  Taking miralax QOD. No greasy stools. No abd pain.  No blood in stools.  Appetite is still good.    Meds, vitals, and allergies reviewed.   ROS: Per HPI unless specifically indicated in ROS section   GEN: nad, alert and oriented HEENT: NECK: supple w/o LA CV: rrr.  PULM: ctab, no inc wob ABD: soft, +bs EXT: no edema SKIN: Well-perfused.

## 2023-01-29 NOTE — Assessment & Plan Note (Signed)
I think it makes sense to continue with B12 replacement as is.  I would defer checking a testosterone level at this point because I would be hesitant to start testosterone replacement even if he had a low level.  Rationale discussed with patient.  He agrees with plan.  He does have weight loss noted over the last approximately 6 months.  He does not have greasy stools, belly pain or blood in his stools.  His appetite is still good.  Unclear source for weight loss and unclear if it is related to his B12 deficiency.  I think it makes sense to check basic stool studies first and then go from there.  If he has a malabsorptive component contributing to weight loss then we can address that.  We can recheck his vitamin D along with his B12 level later on.  He agrees with plan.  Okay for outpatient follow-up.  Benign exam at office visit.

## 2023-01-30 ENCOUNTER — Other Ambulatory Visit: Payer: Medicare Other

## 2023-01-30 DIAGNOSIS — R634 Abnormal weight loss: Secondary | ICD-10-CM | POA: Diagnosis not present

## 2023-01-31 ENCOUNTER — Ambulatory Visit: Payer: Medicare Other

## 2023-01-31 DIAGNOSIS — M170 Bilateral primary osteoarthritis of knee: Secondary | ICD-10-CM | POA: Diagnosis not present

## 2023-02-03 LAB — FECAL FAT, QUALITATIVE
Fat Qual Neutral, Stl: INCREASED
Fat Qual Total, Stl: INCREASED

## 2023-02-05 ENCOUNTER — Other Ambulatory Visit: Payer: Self-pay | Admitting: Family Medicine

## 2023-02-05 DIAGNOSIS — R634 Abnormal weight loss: Secondary | ICD-10-CM

## 2023-02-06 ENCOUNTER — Ambulatory Visit (INDEPENDENT_AMBULATORY_CARE_PROVIDER_SITE_OTHER): Payer: Medicare Other | Admitting: Dermatology

## 2023-02-06 DIAGNOSIS — L570 Actinic keratosis: Secondary | ICD-10-CM

## 2023-02-06 LAB — PANCREATIC ELASTASE, FECAL: Pancreatic Elastase-1, Stool: 358 mcg/g

## 2023-02-06 MED ORDER — AMINOLEVULINIC ACID HCL 10 % EX GEL
2000.0000 mg | Freq: Once | CUTANEOUS | Status: AC
  Administered 2023-02-06: 2000 mg via TOPICAL

## 2023-02-06 NOTE — Progress Notes (Signed)
Patient completed red light photo therapy today with debridement  1. AK (actinic keratosis) Head - Anterior (Face)  Photodynamic therapy - Head - Anterior (Face) Procedure discussed: discussed risks, benefits, side effects. and alternatives   Prep: site scrubbed/prepped with acetone   Debridement needed: Yes   Location:  Face Number of lesions:  Multiple Type of treatment:  Red light Aminolevulinic Acid (see MAR for details): Ameluz Amount of Ameluz (mg):  1 Incubation time (minutes):  60 Number of minutes under lamp:  20 Number of seconds under lamp:  0 Cooling:  Floor fan Outcome: patient tolerated procedure well with no complications   Post-procedure details: sunscreen applied    Related Medications calcipotriene (DOVONOX) 0.005 % cream Apply to cheeks twice a day x 1 week.  Johnsie Kindred, RMA  I personally debrided area prior to application of aminolevulinic acid Brendolyn Patty  Documentation: I have reviewed the above documentation for accuracy and completeness, and I agree with the above.  Brendolyn Patty MD

## 2023-02-06 NOTE — Patient Instructions (Signed)

## 2023-02-08 DIAGNOSIS — M170 Bilateral primary osteoarthritis of knee: Secondary | ICD-10-CM | POA: Diagnosis not present

## 2023-02-13 DIAGNOSIS — M170 Bilateral primary osteoarthritis of knee: Secondary | ICD-10-CM | POA: Diagnosis not present

## 2023-02-13 DIAGNOSIS — S46012A Strain of muscle(s) and tendon(s) of the rotator cuff of left shoulder, initial encounter: Secondary | ICD-10-CM | POA: Diagnosis not present

## 2023-02-14 ENCOUNTER — Ambulatory Visit
Admission: RE | Admit: 2023-02-14 | Discharge: 2023-02-14 | Disposition: A | Discharge status: Home / Self Care | Payer: Medicare Other | Source: Ambulatory Visit | Attending: Family Medicine | Admitting: Family Medicine

## 2023-02-14 DIAGNOSIS — K5730 Diverticulosis of large intestine without perforation or abscess without bleeding: Secondary | ICD-10-CM | POA: Diagnosis not present

## 2023-02-14 DIAGNOSIS — R634 Abnormal weight loss: Secondary | ICD-10-CM | POA: Diagnosis not present

## 2023-02-14 DIAGNOSIS — N281 Cyst of kidney, acquired: Secondary | ICD-10-CM | POA: Diagnosis not present

## 2023-02-14 DIAGNOSIS — K760 Fatty (change of) liver, not elsewhere classified: Secondary | ICD-10-CM | POA: Diagnosis not present

## 2023-02-14 MED ORDER — IOPAMIDOL (ISOVUE-300) INJECTION 61%
125.0000 mL | Freq: Once | INTRAVENOUS | Status: AC | PRN
  Administered 2023-02-14: 125 mL via INTRAVENOUS

## 2023-02-15 DIAGNOSIS — K080 Exfoliation of teeth due to systemic causes: Secondary | ICD-10-CM | POA: Diagnosis not present

## 2023-02-21 ENCOUNTER — Ambulatory Visit (INDEPENDENT_AMBULATORY_CARE_PROVIDER_SITE_OTHER): Payer: Medicare Other | Admitting: *Deleted

## 2023-02-21 DIAGNOSIS — E538 Deficiency of other specified B group vitamins: Secondary | ICD-10-CM

## 2023-02-21 MED ORDER — CYANOCOBALAMIN 1000 MCG/ML IJ SOLN
1000.0000 ug | Freq: Once | INTRAMUSCULAR | Status: AC
  Administered 2023-02-21: 1000 ug via INTRAMUSCULAR

## 2023-02-21 NOTE — Progress Notes (Signed)
Per orders of Dr. Duncan, injection of Vitamin B 12  given by Velta Rockholt LPN Patient tolerated injection well.  

## 2023-02-22 ENCOUNTER — Other Ambulatory Visit: Payer: Self-pay | Admitting: Family Medicine

## 2023-02-22 DIAGNOSIS — I10 Essential (primary) hypertension: Secondary | ICD-10-CM

## 2023-02-22 MED ORDER — ATORVASTATIN CALCIUM 10 MG PO TABS: 10.0000 mg | ORAL_TABLET | Freq: Every day | ORAL | 3 refills | Status: DC

## 2023-03-02 ENCOUNTER — Other Ambulatory Visit: Payer: Medicare Other

## 2023-03-03 ENCOUNTER — Other Ambulatory Visit: Payer: Self-pay | Admitting: Family Medicine

## 2023-03-08 DIAGNOSIS — K080 Exfoliation of teeth due to systemic causes: Secondary | ICD-10-CM | POA: Diagnosis not present

## 2023-03-11 DIAGNOSIS — K080 Exfoliation of teeth due to systemic causes: Secondary | ICD-10-CM | POA: Diagnosis not present

## 2023-03-24 ENCOUNTER — Encounter: Payer: Self-pay | Admitting: Family Medicine

## 2023-03-24 ENCOUNTER — Ambulatory Visit (INDEPENDENT_AMBULATORY_CARE_PROVIDER_SITE_OTHER): Payer: Medicare Other | Admitting: Family Medicine

## 2023-03-24 VITALS — BP 120/72 | HR 77 | Temp 97.2°F | Ht 70.0 in | Wt 245.0 lb

## 2023-03-24 DIAGNOSIS — R2689 Other abnormalities of gait and mobility: Secondary | ICD-10-CM | POA: Diagnosis not present

## 2023-03-24 DIAGNOSIS — I2510 Atherosclerotic heart disease of native coronary artery without angina pectoris: Secondary | ICD-10-CM | POA: Diagnosis not present

## 2023-03-24 MED ORDER — HYDROCHLOROTHIAZIDE 12.5 MG PO TABS: 6.2500 mg | ORAL_TABLET | Freq: Every day | ORAL | Status: DC | PRN

## 2023-03-24 NOTE — Progress Notes (Unsigned)
IMPRESSION: 1. No acute intra-abdominal or pelvic pathology. 2. Sigmoid diverticulosis. No bowel obstruction. Normal appendix. 3. Mild fatty liver. 4.  Aortic Atherosclerosis (ICD10-I70.0).  There is 3 vessel coronary vascular calcification.  Weight 237 lbs on 01/26/23.   245 lbs today.    No CP.  He has SOB since having covid.    CAD d/w pt.  Images reviewed with patient.  No aches on statin.  Rationale for use d/w pt.    He has balance changes.  Dw pt about PT.   Declined for now.

## 2023-03-24 NOTE — Patient Instructions (Addendum)
Recheck labs in about 1 month.  Fasting lab visit.   If you have muscle aches on atorvastatin then let me know.  Take care.  Glad to see you.

## 2023-03-26 DIAGNOSIS — I2510 Atherosclerotic heart disease of native coronary artery without angina pectoris: Secondary | ICD-10-CM | POA: Insufficient documentation

## 2023-03-26 NOTE — Assessment & Plan Note (Signed)
He does not have chest pain.  He attributes his shortness of breath to COVID infection.  His shortness of breath is not progressive.  Discussed rationale for statin use.  No adverse effect on medication in the meantime.  Would continue as is with routine statin cautions and we can recheck his labs later this year.  Even if we sent him for calcium scoring to delineate the severity of his disease, it would likely not change the plan since he is not having chest pain, meaning he would likely not need intervention with stents/bypass and risk factor modification would be a reasonable treatment. 30 minutes were devoted to patient care in this encounter (this includes time spent reviewing the patient's file/history, interviewing and examining the patient, counseling/reviewing plan with patient).

## 2023-03-26 NOTE — Assessment & Plan Note (Signed)
Encourage PT.  He can consider and let me know.

## 2023-03-27 ENCOUNTER — Other Ambulatory Visit: Payer: BLUE CROSS/BLUE SHIELD

## 2023-03-28 ENCOUNTER — Other Ambulatory Visit: Payer: BLUE CROSS/BLUE SHIELD

## 2023-03-28 ENCOUNTER — Ambulatory Visit (INDEPENDENT_AMBULATORY_CARE_PROVIDER_SITE_OTHER): Payer: Medicare Other

## 2023-03-28 ENCOUNTER — Ambulatory Visit: Payer: Medicare Other | Admitting: Dermatology

## 2023-03-28 VITALS — BP 124/70 | HR 83

## 2023-03-28 DIAGNOSIS — L578 Other skin changes due to chronic exposure to nonionizing radiation: Secondary | ICD-10-CM | POA: Diagnosis not present

## 2023-03-28 DIAGNOSIS — C44311 Basal cell carcinoma of skin of nose: Secondary | ICD-10-CM | POA: Diagnosis not present

## 2023-03-28 DIAGNOSIS — C44329 Squamous cell carcinoma of skin of other parts of face: Secondary | ICD-10-CM

## 2023-03-28 DIAGNOSIS — C4491 Basal cell carcinoma of skin, unspecified: Secondary | ICD-10-CM

## 2023-03-28 DIAGNOSIS — L570 Actinic keratosis: Secondary | ICD-10-CM | POA: Diagnosis not present

## 2023-03-28 DIAGNOSIS — E538 Deficiency of other specified B group vitamins: Secondary | ICD-10-CM | POA: Diagnosis not present

## 2023-03-28 DIAGNOSIS — D492 Neoplasm of unspecified behavior of bone, soft tissue, and skin: Secondary | ICD-10-CM

## 2023-03-28 DIAGNOSIS — L821 Other seborrheic keratosis: Secondary | ICD-10-CM | POA: Diagnosis not present

## 2023-03-28 HISTORY — DX: Actinic keratosis: L57.0

## 2023-03-28 HISTORY — DX: Basal cell carcinoma of skin, unspecified: C44.91

## 2023-03-28 MED ORDER — CYANOCOBALAMIN 1000 MCG/ML IJ SOLN
1000.0000 ug | Freq: Once | INTRAMUSCULAR | Status: AC
Start: 2023-03-28 — End: 2023-03-28
  Administered 2023-03-28: 1000 ug via INTRAMUSCULAR

## 2023-03-28 NOTE — Patient Instructions (Signed)
Wound Care Instructions  Cleanse wound gently with soap and water once a day then pat dry with clean gauze. Apply a thin coat of Petrolatum (petroleum jelly, "Vaseline") over the wound (unless you have an allergy to this). We recommend that you use a new, sterile tube of Vaseline. Do not pick or remove scabs. Do not remove the yellow or white "healing tissue" from the base of the wound.  Cover the wound with fresh, clean, nonstick gauze and secure with paper tape. You may use Band-Aids in place of gauze and tape if the wound is small enough, but would recommend trimming much of the tape off as there is often too much. Sometimes Band-Aids can irritate the skin.  You should call the office for your biopsy report after 1 week if you have not already been contacted.  If you experience any problems, such as abnormal amounts of bleeding, swelling, significant bruising, significant pain, or evidence of infection, please call the office immediately.  FOR ADULT SURGERY PATIENTS: If you need something for pain relief you may take 1 extra strength Tylenol (acetaminophen) AND 2 Ibuprofen (200mg each) together every 4 hours as needed for pain. (do not take these if you are allergic to them or if you have a reason you should not take them.) Typically, you may only need pain medication for 1 to 3 days.      Cryotherapy Aftercare  Wash gently with soap and water everyday.   Apply Vaseline and Band-Aid daily until healed.      Due to recent changes in healthcare laws, you may see results of your pathology and/or laboratory studies on MyChart before the doctors have had a chance to review them. We understand that in some cases there may be results that are confusing or concerning to you. Please understand that not all results are received at the same time and often the doctors may need to interpret multiple results in order to provide you with the best plan of care or course of treatment. Therefore, we ask that  you please give us 2 business days to thoroughly review all your results before contacting the office for clarification. Should we see a critical lab result, you will be contacted sooner.   If You Need Anything After Your Visit  If you have any questions or concerns for your doctor, please call our main line at [phone] and press option 4 to reach your doctor's medical assistant. If no one answers, please leave a voicemail as directed and we will return your call as soon as possible. Messages left after 4 pm will be answered the following business day.   You may also send us a message via MyChart. We typically respond to MyChart messages within 1-2 business days.  For prescription refills, please ask your pharmacy to contact our office. Our fax number is [phone].  If you have an urgent issue when the clinic is closed that cannot wait until the next business day, you can page your doctor at the number below.    Please note that while we do our best to be available for urgent issues outside of office hours, we are not available 24/7.   If you have an urgent issue and are unable to reach us, you may choose to seek medical care at your doctor's office, retail clinic, urgent care center, or emergency room.  If you have a medical emergency, please immediately call 911 or go to the emergency department.  Pager Numbers  - Dr.   Kowalski: [phone]  - Dr. Moye: [phone]  - Dr. Stewart: [phone]  In the event of inclement weather, please call our main line at [phone] for an update on the status of any delays or closures.  Dermatology Medication Tips: Please keep the boxes that topical medications come in in order to help keep track of the instructions about where and how to use these. Pharmacies typically print the medication instructions only on the boxes and not directly on the medication tubes.   If your medication is too expensive, please contact our office at  [phone] option 4 or send us a message through MyChart.   We are unable to tell what your co-pay for medications will be in advance as this is different depending on your insurance coverage. However, we may be able to find a substitute medication at lower cost or fill out paperwork to get insurance to cover a needed medication.   If a prior authorization is required to get your medication covered by your insurance company, please allow us 1-2 business days to complete this process.  Drug prices often vary depending on where the prescription is filled and some pharmacies may offer cheaper prices.  The website www.goodrx.com contains coupons for medications through different pharmacies. The prices here do not account for what the cost may be with help from insurance (it may be cheaper with your insurance), but the website can give you the price if you did not use any insurance.  - You can print the associated coupon and take it with your prescription to the pharmacy.  - You may also stop by our office during regular business hours and pick up a GoodRx coupon card.  - If you need your prescription sent electronically to a different pharmacy, notify our office through [facility] MyChart or by phone at [phone] option 4.     Si Usted Necesita Algo Despus de Su Visita  Tambin puede enviarnos un mensaje a travs de MyChart. Por lo general respondemos a los mensajes de MyChart en el transcurso de 1 a 2 das hbiles.  Para renovar recetas, por favor pida a su farmacia que se ponga en contacto con nuestra oficina. Nuestro nmero de fax es el [phone].  Si tiene un asunto urgente cuando la clnica est cerrada y que no puede esperar hasta el siguiente da hbil, puede llamar/localizar a su doctor(a) al nmero que aparece a continuacin.   Por favor, tenga en cuenta que aunque hacemos todo lo posible para estar disponibles para asuntos urgentes fuera del horario de oficina, no estamos  disponibles las 24 horas del da, los 7 das de la semana.   Si tiene un problema urgente y no puede comunicarse con nosotros, puede optar por buscar atencin mdica  en el consultorio de su doctor(a), en una clnica privada, en un centro de atencin urgente o en una sala de emergencias.  Si tiene una emergencia mdica, por favor llame inmediatamente al 911 o vaya a la sala de emergencias.  Nmeros de bper  - Dr. Kowalski: [phone]  - Dra. Moye: [phone]  - Dra. Stewart: [phone]  En caso de inclemencias del tiempo, por favor llame a nuestra lnea principal al [phone] para una actualizacin sobre el estado de cualquier retraso o cierre.  Consejos para la medicacin en dermatologa: Por favor, guarde las cajas en las que vienen los medicamentos de uso tpico para ayudarle a seguir las instrucciones sobre dnde y cmo usarlos. Las farmacias generalmente imprimen las instrucciones del medicamento slo   en las cajas y no directamente en los tubos del medicamento.   Si su medicamento es muy caro, por favor, pngase en contacto con nuestra oficina llamando al [phone] y presione la opcin 4 o envenos un mensaje a travs de MyChart.   No podemos decirle cul ser su copago por los medicamentos por adelantado ya que esto es diferente dependiendo de la cobertura de su seguro. Sin embargo, es posible que podamos encontrar un medicamento sustituto a menor costo o llenar un formulario para que el seguro cubra el medicamento que se considera necesario.   Si se requiere una autorizacin previa para que su compaa de seguros cubra su medicamento, por favor permtanos de 1 a 2 das hbiles para completar este proceso.  Los precios de los medicamentos varan con frecuencia dependiendo del lugar de dnde se surte la receta y alguna farmacias pueden ofrecer precios ms baratos.  El sitio web www.goodrx.com tiene cupones para medicamentos de diferentes farmacias. Los precios aqu no  tienen en cuenta lo que podra costar con la ayuda del seguro (puede ser ms barato con su seguro), pero el sitio web puede darle el precio si no utiliz ningn seguro.  - Puede imprimir el cupn correspondiente y llevarlo con su receta a la farmacia.  - Tambin puede pasar por nuestra oficina durante el horario de atencin regular y recoger una tarjeta de cupones de GoodRx.  - Si necesita que su receta se enve electrnicamente a una farmacia diferente, informe a nuestra oficina a travs de MyChart de [facility] o por telfono llamando al [phone] y presione la opcin 4.  

## 2023-03-28 NOTE — Progress Notes (Unsigned)
Per orders of Dr. Graham Duncan, injection of B-12 given by Ronique Simerly C Nayomi Tabron in right deltoid. Patient tolerated injection well. Patient will make appointment for 1 month.  

## 2023-03-28 NOTE — Progress Notes (Signed)
Follow-Up Visit   Subjective  Todd Gonzales is a 82 y.o. male who presents for the following: 3 months f/u on precancers on his face,scalp, arms and hands  The patient has spots to be evaluated, some may be new or changing and the patient has concerns that these could be cancer.    The following portions of the chart were reviewed this encounter and updated as appropriate: medications, allergies, medical history  Review of Systems:  No other skin or systemic complaints except as noted in HPI or Assessment and Plan.  Objective  Well appearing patient in no apparent distress; mood and affect are within normal limits.  A focused examination was performed of the following areas:face,scalp,arms   Relevant exam findings are noted in the Assessment and Plan.  left infraocular cheek x 7, left preauricular x 1, left temple x 1, left ear helix x 2, left nasal tip x 2, right infraocular x 1, right temple x 1, right nasal dorsum x 1, (16) Erythematous thin papules/macules with gritty scale.   left medial cheek above nasolabial 0.7 cm pink scaly papule          right paranasal 1.0 cm pink slightly pearly thin papule        glabella 0.8 cm pink pearly papule          Assessment & Plan   ACTINIC DAMAGE - chronic, secondary to cumulative UV radiation exposure/sun exposure over time - diffuse scaly erythematous macules with underlying dyspigmentation - Recommend daily broad spectrum sunscreen SPF 30+ to sun-exposed areas, reapply every 2 hours as needed.  - Recommend staying in the shade or wearing long sleeves, sun glasses (UVA+UVB protection) and wide brim hats (4-inch brim around the entire circumference of the hat). - Call for new or changing lesions.   AK (actinic keratosis) (16) left infraocular cheek x 7, left preauricular x 1, left temple x 1, left ear helix x 2, left nasal tip x 2, right infraocular x 1, right temple x 1, right nasal dorsum x 1,  Actinic  keratoses are precancerous spots that appear secondary to cumulative UV radiation exposure/sun exposure over time. They are chronic with expected duration over 1 year. A portion of actinic keratoses will progress to squamous cell carcinoma of the skin. It is not possible to reliably predict which spots will progress to skin cancer and so treatment is recommended to prevent development of skin cancer.  Recommend daily broad spectrum sunscreen SPF 30+ to sun-exposed areas, reapply every 2 hours as needed.  Recommend staying in the shade or wearing long sleeves, sun glasses (UVA+UVB protection) and wide brim hats (4-inch brim around the entire circumference of the hat). Call for new or changing lesions.   Destruction of lesion - left infraocular cheek x 7, left preauricular x 1, left temple x 1, left ear helix x 2, left nasal tip x 2, right infraocular x 1, right temple x 1, right nasal dorsum x 1,  Destruction method: cryotherapy   Informed consent: discussed and consent obtained   Lesion destroyed using liquid nitrogen: Yes   Region frozen until ice ball extended beyond lesion: Yes   Outcome: patient tolerated procedure well with no complications   Post-procedure details: wound care instructions given   Additional details:  Prior to procedure, discussed risks of blister formation, small wound, skin dyspigmentation, or rare scar following cryotherapy. Recommend Vaseline ointment to treated areas while healing.   Neoplasm of skin (3) left medial cheek above nasolabial  Skin / nail biopsy Type of biopsy: tangential   Informed consent: discussed and consent obtained   Patient was prepped and draped in usual sterile fashion: area prepped with alochol. Anesthesia: the lesion was anesthetized in a standard fashion   Anesthetic:  1% lidocaine w/ epinephrine 1-100,000 buffered w/ 8.4% NaHCO3 Instrument used: flexible razor blade   Hemostasis achieved with: pressure, aluminum chloride and  electrodesiccation   Outcome: patient tolerated procedure well   Post-procedure details: wound care instructions given   Post-procedure details comment:  Ointment and small bandage  Specimen 1 - Surgical pathology Differential Diagnosis: R/O BCC/SCC   Check Margins: No  right paranasal  Skin / nail biopsy Type of biopsy: tangential   Informed consent: discussed and consent obtained   Patient was prepped and draped in usual sterile fashion: area prepped with alochol. Anesthesia: the lesion was anesthetized in a standard fashion   Anesthetic:  1% lidocaine w/ epinephrine 1-100,000 buffered w/ 8.4% NaHCO3 Instrument used: flexible razor blade   Hemostasis achieved with: pressure, aluminum chloride and electrodesiccation   Outcome: patient tolerated procedure well   Post-procedure details: wound care instructions given   Post-procedure details comment:  Ointment and small bandage  Specimen 2 - Surgical pathology Differential Diagnosis: R/O BCC/SCC  Check Margins: No  glabella  Skin / nail biopsy Type of biopsy: tangential   Informed consent: discussed and consent obtained   Patient was prepped and draped in usual sterile fashion: area prepped with alochol. Anesthesia: the lesion was anesthetized in a standard fashion   Anesthetic:  1% lidocaine w/ epinephrine 1-100,000 buffered w/ 8.4% NaHCO3 Instrument used: flexible razor blade   Hemostasis achieved with: pressure, aluminum chloride and electrodesiccation   Outcome: patient tolerated procedure well   Post-procedure details: wound care instructions given   Post-procedure details comment:  Ointment and small bandage  Specimen 3 - Surgical pathology Differential Diagnosis: R/O BCC/SCC  Check Margins: No  May consider [facility] surgery in [facility] pending pathology results discussed    Return in about 3 months (around 06/27/2023) for AKs .  I, Angelique Holm, CMA, am acting as scribe for Willeen Niece, MD .    Documentation: I have reviewed the above documentation for accuracy and completeness, and I agree with the above.  Willeen Niece, MD

## 2023-03-30 ENCOUNTER — Other Ambulatory Visit: Payer: Self-pay | Admitting: Family Medicine

## 2023-03-31 NOTE — Telephone Encounter (Signed)
Refill request for GABAPENTIN 300MG  CAPSULE   LOV - 419/24 Next OV - not scheduled Last refill - 08/23/23 #90/1

## 2023-04-01 ENCOUNTER — Other Ambulatory Visit: Payer: Self-pay | Admitting: Family Medicine

## 2023-04-03 ENCOUNTER — Ambulatory Visit: Payer: Medicare Other | Admitting: Dermatology

## 2023-04-03 ENCOUNTER — Telehealth: Payer: Self-pay

## 2023-04-03 DIAGNOSIS — C44310 Basal cell carcinoma of skin of unspecified parts of face: Secondary | ICD-10-CM

## 2023-04-03 NOTE — Telephone Encounter (Signed)
Advised pt of bx results.  Advised pt we would send referral to [facility] for both the [facility] and SCC./sh

## 2023-04-03 NOTE — Telephone Encounter (Signed)
-----   Message from Willeen Niece, MD sent at 04/03/2023  1:32 PM EDT ----- 1. Skin , left medial cheek above nasolabial WELL DIFFERENTIATED SQUAMOUS CELL CARCINOMA WITH SUPERFICIAL INFILTRATION, ULCERATED 2. Skin , right paranasal BASAL CELL CARCINOMA, NODULAR PATTERN 3. Skin , glabella ACTINIC KERATOSIS AND SEBORRHEIC KERATOSIS  1.  SCC skin cancer needs Mohs surgery [place] 2.  BCC skin cancer needs Mohs surgery [place] 3.  AK (precancer) and SK, cryotherapy to residual at next f/up  - please call patient

## 2023-04-04 ENCOUNTER — Ambulatory Visit: Payer: Medicare Other

## 2023-04-05 ENCOUNTER — Other Ambulatory Visit: Payer: BLUE CROSS/BLUE SHIELD

## 2023-04-07 ENCOUNTER — Other Ambulatory Visit: Payer: Self-pay | Admitting: Family Medicine

## 2023-04-07 ENCOUNTER — Ambulatory Visit (INDEPENDENT_AMBULATORY_CARE_PROVIDER_SITE_OTHER): Payer: Medicare Other | Admitting: Family Medicine

## 2023-04-07 ENCOUNTER — Encounter: Payer: Self-pay | Admitting: Family Medicine

## 2023-04-07 VITALS — BP 110/68 | HR 99 | Temp 98.4°F | Ht 70.0 in | Wt 246.0 lb

## 2023-04-07 DIAGNOSIS — R300 Dysuria: Secondary | ICD-10-CM | POA: Diagnosis not present

## 2023-04-07 MED ORDER — HYDROCHLOROTHIAZIDE 12.5 MG PO TABS: 12.5000 mg | ORAL_TABLET | ORAL | Status: DC

## 2023-04-07 MED ORDER — CIPROFLOXACIN HCL 500 MG PO TABS: 500.0000 mg | ORAL_TABLET | Freq: Two times a day (BID) | ORAL | 0 refills | Status: DC

## 2023-04-07 NOTE — Progress Notes (Unsigned)
Inc nocturia over the last few days.  He didn't feel well in general starting 2 days ago.  Then had nocturia for the next 2 nights. Last night was relatively better but still nocturia x5.  No burning with urination.  No fevers. Prev with abd discomfort but not now, prev lower abd.  H/o prostatitis and this feel similar.  Taking 2 flomax per day.  Baseline nocturia x2.    Weight loss resolved.    Meds, vitals, and allergies reviewed.   ROS: Per HPI unless specifically indicated in ROS section   Nad Ncat Neck supple, no LA Rrr ctab Abd soft, not ttp Trace BLE Skin well perfused.  Defer DRE as it wouldn't change mgmt today, he agrees.   

## 2023-04-07 NOTE — Patient Instructions (Signed)
Go to the lab on the way out.   If you have mychart we'll likely use that to update you.    Start cipro.  If clearly better and symptoms resolve in a few days then stop the cipro and update [place].

## 2023-04-08 LAB — URINALYSIS, ROUTINE W REFLEX MICROSCOPIC
Bacteria, UA: NONE SEEN /HPF
Bilirubin Urine: NEGATIVE
Glucose, UA: NEGATIVE
Hgb urine dipstick: NEGATIVE
Hyaline Cast: NONE SEEN /LPF
Nitrite: NEGATIVE
RBC / HPF: NONE SEEN /HPF (ref 0–2)
Specific Gravity, Urine: 1.017 (ref 1.001–1.035)
pH: <=5 (ref 5.0–8.0)

## 2023-04-08 LAB — URINE CULTURE
MICRO NUMBER:: 14910799
Result:: NO GROWTH
SPECIMEN QUALITY:: ADEQUATE

## 2023-04-08 LAB — MICROSCOPIC MESSAGE

## 2023-04-09 NOTE — Assessment & Plan Note (Signed)
See notes on labs.  Start cipro.  If clearly better and symptoms resolve in a few days then stop the cipro and update [place].  Okay for outpatient f/u.

## 2023-04-09 NOTE — Progress Notes (Incomplete)
Inc nocturia over the last few days.  He didn't feel well in general starting 2 days ago.  Then had nocturia for the next 2 nights. Last night was relatively better but still nocturia x5.  No burning with urination.  No fevers. Prev with abd discomfort but not now, prev lower abd.  H/o prostatitis and this feel similar.  Taking 2 flomax per day.  Baseline nocturia x2.    Weight loss resolved.    Meds, vitals, and allergies reviewed.   ROS: Per HPI unless specifically indicated in ROS section   Nad Ncat Neck supple, no LA Rrr ctab Abd soft, not ttp Trace BLE Skin well perfused.  Defer DRE as it wouldn't change mgmt today, he agrees.

## 2023-04-13 ENCOUNTER — Other Ambulatory Visit: Payer: Self-pay | Admitting: Family Medicine

## 2023-04-13 ENCOUNTER — Telehealth: Payer: Self-pay | Admitting: Family Medicine

## 2023-04-13 DIAGNOSIS — R0609 Other forms of dyspnea: Secondary | ICD-10-CM

## 2023-04-13 DIAGNOSIS — R8290 Unspecified abnormal findings in urine: Secondary | ICD-10-CM

## 2023-04-13 NOTE — Telephone Encounter (Signed)
Patient wife called in and stated that Todd Gonzales has been sick a lot and she was wanting to know if a referral could be sent in to a cardiologist. She stated because he is weak, has SOB and had some arteries that had calcified. She stated that Dr. Para March had been watching him. She would like the referral to be sent over to Dr. Bryan Lemma. Thank you!

## 2023-04-14 ENCOUNTER — Encounter (HOSPITAL_COMMUNITY): Payer: Self-pay

## 2023-04-14 ENCOUNTER — Other Ambulatory Visit: Payer: Self-pay

## 2023-04-14 ENCOUNTER — Inpatient Hospital Stay (HOSPITAL_COMMUNITY)
Admission: EM | Admit: 2023-04-14 | Discharge: 2023-04-16 | DRG: 309 | Disposition: A | Discharge status: Home Health | Payer: Medicare Other | Attending: Internal Medicine | Admitting: Internal Medicine

## 2023-04-14 ENCOUNTER — Encounter: Payer: Self-pay | Admitting: Nurse Practitioner

## 2023-04-14 ENCOUNTER — Telehealth: Payer: Self-pay | Admitting: Family Medicine

## 2023-04-14 ENCOUNTER — Ambulatory Visit: Payer: Medicare Other | Admitting: Family Medicine

## 2023-04-14 ENCOUNTER — Ambulatory Visit (INDEPENDENT_AMBULATORY_CARE_PROVIDER_SITE_OTHER): Payer: Medicare Other | Admitting: Nurse Practitioner

## 2023-04-14 ENCOUNTER — Emergency Department (HOSPITAL_COMMUNITY): Payer: Medicare Other

## 2023-04-14 VITALS — BP 116/78 | HR 62 | Temp 97.7°F | Resp 16 | Ht 70.0 in | Wt 251.4 lb

## 2023-04-14 DIAGNOSIS — Z801 Family history of malignant neoplasm of trachea, bronchus and lung: Secondary | ICD-10-CM

## 2023-04-14 DIAGNOSIS — R0600 Dyspnea, unspecified: Secondary | ICD-10-CM | POA: Diagnosis not present

## 2023-04-14 DIAGNOSIS — I480 Paroxysmal atrial fibrillation: Principal | ICD-10-CM | POA: Diagnosis present

## 2023-04-14 DIAGNOSIS — Z8261 Family history of arthritis: Secondary | ICD-10-CM | POA: Diagnosis not present

## 2023-04-14 DIAGNOSIS — E785 Hyperlipidemia, unspecified: Secondary | ICD-10-CM | POA: Diagnosis not present

## 2023-04-14 DIAGNOSIS — R0602 Shortness of breath: Secondary | ICD-10-CM

## 2023-04-14 DIAGNOSIS — Z85828 Personal history of other malignant neoplasm of skin: Secondary | ICD-10-CM

## 2023-04-14 DIAGNOSIS — R7989 Other specified abnormal findings of blood chemistry: Secondary | ICD-10-CM | POA: Diagnosis not present

## 2023-04-14 DIAGNOSIS — R600 Localized edema: Secondary | ICD-10-CM

## 2023-04-14 DIAGNOSIS — I4892 Unspecified atrial flutter: Secondary | ICD-10-CM

## 2023-04-14 DIAGNOSIS — Z96649 Presence of unspecified artificial hip joint: Secondary | ICD-10-CM | POA: Diagnosis not present

## 2023-04-14 DIAGNOSIS — R0902 Hypoxemia: Secondary | ICD-10-CM | POA: Diagnosis not present

## 2023-04-14 DIAGNOSIS — I4891 Unspecified atrial fibrillation: Secondary | ICD-10-CM | POA: Diagnosis not present

## 2023-04-14 DIAGNOSIS — R000 Tachycardia, unspecified: Secondary | ICD-10-CM | POA: Diagnosis not present

## 2023-04-14 DIAGNOSIS — Z882 Allergy status to sulfonamides status: Secondary | ICD-10-CM

## 2023-04-14 DIAGNOSIS — Z6836 Body mass index (BMI) 36.0-36.9, adult: Secondary | ICD-10-CM | POA: Diagnosis not present

## 2023-04-14 DIAGNOSIS — Z79899 Other long term (current) drug therapy: Secondary | ICD-10-CM

## 2023-04-14 DIAGNOSIS — R110 Nausea: Secondary | ICD-10-CM | POA: Diagnosis not present

## 2023-04-14 DIAGNOSIS — I959 Hypotension, unspecified: Secondary | ICD-10-CM | POA: Diagnosis not present

## 2023-04-14 DIAGNOSIS — N400 Enlarged prostate without lower urinary tract symptoms: Secondary | ICD-10-CM | POA: Diagnosis not present

## 2023-04-14 DIAGNOSIS — E876 Hypokalemia: Secondary | ICD-10-CM | POA: Diagnosis present

## 2023-04-14 DIAGNOSIS — M4800 Spinal stenosis, site unspecified: Secondary | ICD-10-CM | POA: Diagnosis not present

## 2023-04-14 DIAGNOSIS — I499 Cardiac arrhythmia, unspecified: Secondary | ICD-10-CM | POA: Insufficient documentation

## 2023-04-14 DIAGNOSIS — J9811 Atelectasis: Secondary | ICD-10-CM | POA: Diagnosis not present

## 2023-04-14 DIAGNOSIS — D539 Nutritional anemia, unspecified: Secondary | ICD-10-CM | POA: Diagnosis not present

## 2023-04-14 DIAGNOSIS — R531 Weakness: Secondary | ICD-10-CM | POA: Diagnosis not present

## 2023-04-14 DIAGNOSIS — I10 Essential (primary) hypertension: Secondary | ICD-10-CM | POA: Diagnosis present

## 2023-04-14 DIAGNOSIS — Z825 Family history of asthma and other chronic lower respiratory diseases: Secondary | ICD-10-CM

## 2023-04-14 DIAGNOSIS — Z885 Allergy status to narcotic agent status: Secondary | ICD-10-CM

## 2023-04-14 DIAGNOSIS — E669 Obesity, unspecified: Secondary | ICD-10-CM | POA: Diagnosis present

## 2023-04-14 DIAGNOSIS — D696 Thrombocytopenia, unspecified: Secondary | ICD-10-CM | POA: Diagnosis not present

## 2023-04-14 DIAGNOSIS — I493 Ventricular premature depolarization: Secondary | ICD-10-CM

## 2023-04-14 HISTORY — DX: Paroxysmal atrial fibrillation: I48.0

## 2023-04-14 LAB — CBC WITH DIFFERENTIAL/PLATELET
Abs Immature Granulocytes: 0.04 10*3/uL (ref 0.00–0.07)
Basophils Absolute: 0 10*3/uL (ref 0.0–0.1)
Basophils Relative: 0 %
Eosinophils Absolute: 0 10*3/uL (ref 0.0–0.5)
Eosinophils Relative: 1 %
HCT: 44.6 % (ref 39.0–52.0)
Hemoglobin: 14 g/dL (ref 13.0–17.0)
Immature Granulocytes: 1 %
Lymphocytes Relative: 12 %
Lymphs Abs: 1 10*3/uL (ref 0.7–4.0)
MCH: 32.7 pg (ref 26.0–34.0)
MCHC: 31.4 g/dL (ref 30.0–36.0)
MCV: 104.2 fL — ABNORMAL HIGH (ref 80.0–100.0)
Monocytes Absolute: 0.9 10*3/uL (ref 0.1–1.0)
Monocytes Relative: 10 %
Neutro Abs: 6.8 10*3/uL (ref 1.7–7.7)
Neutrophils Relative %: 76 %
Platelets: 149 10*3/uL — ABNORMAL LOW (ref 150–400)
RBC: 4.28 MIL/uL (ref 4.22–5.81)
RDW: 13.3 % (ref 11.5–15.5)
WBC: 8.8 10*3/uL (ref 4.0–10.5)
nRBC: 0 % (ref 0.0–0.2)

## 2023-04-14 LAB — COMPREHENSIVE METABOLIC PANEL
ALT: 38 U/L (ref 0–44)
AST: 35 U/L (ref 15–41)
Albumin: 3.8 g/dL (ref 3.5–5.0)
Alkaline Phosphatase: 40 U/L (ref 38–126)
Anion gap: 10 (ref 5–15)
BUN: 22 mg/dL (ref 8–23)
CO2: 27 mmol/L (ref 22–32)
Calcium: 8.4 mg/dL — ABNORMAL LOW (ref 8.9–10.3)
Chloride: 103 mmol/L (ref 98–111)
Creatinine, Ser: 1.05 mg/dL (ref 0.61–1.24)
GFR, Estimated: >60 mL/min (ref >60)
Glucose, Bld: 112 mg/dL — ABNORMAL HIGH (ref 70–99)
Potassium: 4.1 mmol/L (ref 3.5–5.1)
Sodium: 140 mmol/L (ref 135–145)
Total Bilirubin: 2.1 mg/dL — ABNORMAL HIGH (ref 0.3–1.2)
Total Protein: 6.4 g/dL — ABNORMAL LOW (ref 6.5–8.1)

## 2023-04-14 LAB — BRAIN NATRIURETIC PEPTIDE: B Natriuretic Peptide: 229.7 pg/mL — ABNORMAL HIGH (ref 0.0–100.0)

## 2023-04-14 LAB — URINALYSIS, W/ REFLEX TO CULTURE (INFECTION SUSPECTED)
Bilirubin Urine: NEGATIVE
Glucose, UA: NEGATIVE mg/dL
Hgb urine dipstick: NEGATIVE
Ketones, ur: 5 mg/dL — AB
Leukocytes,Ua: NEGATIVE
Nitrite: NEGATIVE
Protein, ur: 30 mg/dL — AB
Specific Gravity, Urine: 1.019 (ref 1.005–1.030)
pH: 5 (ref 5.0–8.0)

## 2023-04-14 LAB — I-STAT CHEM 8, ED
BUN: 27 mg/dL — ABNORMAL HIGH (ref 8–23)
Calcium, Ion: 1.01 mmol/L — ABNORMAL LOW (ref 1.15–1.40)
Chloride: 104 mmol/L (ref 98–111)
Creatinine, Ser: 1 mg/dL (ref 0.61–1.24)
Glucose, Bld: 113 mg/dL — ABNORMAL HIGH (ref 70–99)
HCT: 43 % (ref 39.0–52.0)
Hemoglobin: 14.6 g/dL (ref 13.0–17.0)
Potassium: 4.1 mmol/L (ref 3.5–5.1)
Sodium: 140 mmol/L (ref 135–145)
TCO2: 28 mmol/L (ref 22–32)

## 2023-04-14 LAB — TROPONIN I (HIGH SENSITIVITY)
Troponin I (High Sensitivity): 7 ng/L (ref <18)
Troponin I (High Sensitivity): 9 ng/L (ref <18)

## 2023-04-14 MED ORDER — ATORVASTATIN CALCIUM 10 MG PO TABS
10.0000 mg | ORAL_TABLET | Freq: Every day | ORAL | Status: DC
  Filled 2023-04-14 (×2): qty 1

## 2023-04-14 MED ORDER — SODIUM CHLORIDE 0.9% FLUSH
3.0000 mL | Freq: Two times a day (BID) | INTRAVENOUS | Status: DC
  Administered 2023-04-16: 3 mL via INTRAVENOUS

## 2023-04-14 MED ORDER — ACETAMINOPHEN 650 MG RE SUPP: 650.0000 mg | Freq: Four times a day (QID) | RECTAL | Status: DC | PRN

## 2023-04-14 MED ORDER — POLYETHYLENE GLYCOL 3350 17 G PO PACK: 17.0000 g | PACK | Freq: Every day | ORAL | Status: DC | PRN

## 2023-04-14 MED ORDER — TAMSULOSIN HCL 0.4 MG PO CAPS
0.4000 mg | ORAL_CAPSULE | Freq: Every day | ORAL | Status: DC
  Administered 2023-04-15 – 2023-04-16 (×2): 0.4 mg via ORAL
  Filled 2023-04-14 (×2): qty 1

## 2023-04-14 MED ORDER — SALINE SPRAY 0.65 % NA SOLN: 1.0000 | NASAL | Status: DC | PRN

## 2023-04-14 MED ORDER — HYDROCORTISONE 1 % EX CREA
TOPICAL_CREAM | Freq: Three times a day (TID) | CUTANEOUS | Status: DC
  Filled 2023-04-14: qty 28

## 2023-04-14 MED ORDER — APIXABAN 5 MG PO TABS
5.0000 mg | ORAL_TABLET | Freq: Two times a day (BID) | ORAL | Status: DC
  Administered 2023-04-14 – 2023-04-16 (×4): 5 mg via ORAL
  Filled 2023-04-14 (×4): qty 1

## 2023-04-14 MED ORDER — AMIODARONE LOAD VIA INFUSION
150.0000 mg | Freq: Once | INTRAVENOUS | Status: AC
  Administered 2023-04-14: 150 mg via INTRAVENOUS
  Filled 2023-04-14: qty 83.34

## 2023-04-14 MED ORDER — AMIODARONE HCL IN DEXTROSE 360-4.14 MG/200ML-% IV SOLN
30.0000 mg/h | INTRAVENOUS | Status: DC
  Administered 2023-04-15 (×2): 30 mg/h via INTRAVENOUS
  Filled 2023-04-14 (×4): qty 200

## 2023-04-14 MED ORDER — ACETAMINOPHEN 325 MG PO TABS: 650.0000 mg | ORAL_TABLET | Freq: Four times a day (QID) | ORAL | Status: DC | PRN

## 2023-04-14 MED ORDER — AMIODARONE HCL IN DEXTROSE 360-4.14 MG/200ML-% IV SOLN
60.0000 mg/h | INTRAVENOUS | Status: AC
  Administered 2023-04-14 (×2): 60 mg/h via INTRAVENOUS
  Filled 2023-04-14: qty 200

## 2023-04-14 MED ORDER — DILTIAZEM HCL-DEXTROSE 125-5 MG/125ML-% IV SOLN (PREMIX)
5.0000 mg/h | INTRAVENOUS | Status: DC
  Administered 2023-04-14: 5 mg/h via INTRAVENOUS
  Filled 2023-04-14: qty 125

## 2023-04-14 MED ORDER — GABAPENTIN 300 MG PO CAPS
300.0000 mg | ORAL_CAPSULE | Freq: Every day | ORAL | Status: DC
  Administered 2023-04-14 – 2023-04-15 (×2): 300 mg via ORAL
  Filled 2023-04-14 (×2): qty 1

## 2023-04-14 NOTE — ED Triage Notes (Signed)
Pt to ED via EMS from doctors office c/o new onset Afib rates 130s. Pt stated he has been feeling weak for a few days and was following up with his PCP today. Pt denies any cardiac hx. He reports SOB and weakness. Aox4 HR 130s upon arrival.

## 2023-04-14 NOTE — Assessment & Plan Note (Signed)
2+ bilateral lower extremity edema.  Patient does take hydrochlorothiazide 12.5 mg every other day.

## 2023-04-14 NOTE — Telephone Encounter (Signed)
[facility] Day - Client TELEPHONE ADVICE RECORD [facility] Patient Name First: Todd Last: Gonzales Gender: Male DOB: [birth year] Age: 82 Y 2 M 24 D Return Phone Number: [phone] (Primary) Address: City/ State/ Zip: [place] [address] Client [facility] Day - Client Client Site [facility] - Day Provider Crawford Givens "Clelia Croft MD Contact Type Call Who Is Calling Patient / Member / Family / Caregiver Call Type Triage / Clinical Caller Name Harriett Sine Relationship To Patient Spouse Return Phone Number [phone] (Primary) Chief Complaint BREATHING - shortness of breath or sounds breathless Reason for Call Symptomatic / Request for Health Information Initial Comment Caller states they have shortness of breath, nausea, unable to stand and is not getting better. Translation No   Nurse Assessment Nurse: Toni Arthurs, RN, Corrie Dandy Date/Time Todd Gonzales Time): 04/14/2023 8:33:34 AM Confirm and document reason for call. If symptomatic, describe symptoms. ---Caller states they have shortness of breath, nausea, unable to stand and is not getting better. Seen by PCP a couple of days ago, had a UA done, started on Cipro. UTI sx have improved. However everyday sense, energy level has decreased. He has been having a difficultly standing for long periods of time. Currently sitting in another room and is 'napping'. Does the patient have any new or worsening symptoms? ---Yes Will a triage be completed? ---Yes Related visit to physician within the last 2 weeks? ---No Does the PT have any chronic conditions? (i.e. diabetes, asthma, this includes High risk factors for pregnancy, etc.) ---Yes List chronic conditions. ---HTN, Is this a behavioral health or substance abuse call? ---No  Guidelines Guideline Title Affirmed Question Affirmed Notes Nurse Date/Time (Eastern Time) Breathing Difficulty [1] MILD difficulty breathing  (e.g., minimal/no SOB at rest, SOB with walking, pulse <100) Mervyn Gay 04/14/2023 8:36:18 AM PLEASE NOTE: All timestamps contained within this report are represented as [place] Standard Time. CONFIDENTIALTY NOTICE: This fax transmission is intended only for the addressee. It contains information that is legally privileged, confidential or otherwise protected from use or disclosure. If you are not the intended recipient, you are strictly prohibited from reviewing, disclosing, copying using or disseminating any of this information or taking any action in reliance on or regarding this information. If you have received this fax in error, please notify us immediately by telephone so that we can arrange for its return to [place]. Phone: [phone], Toll-Free: [phone], Fax: [phone] Page: 2 of 2 Call Id: [id number] Guidelines Guideline Title Affirmed Question Affirmed Notes Nurse Date/Time Todd Gonzales Time) AND [2] NEW-onset or WORSE than normal Disp. Time Todd Gonzales Time) Disposition Final User 04/14/2023 8:31:03 AM Send to Urgent Glade Stanford 04/14/2023 8:42:38 AM See HCP within 4 Hours (or PCP triage) Yes Toni Arthurs, RN, [facility] Final Disposition 04/14/2023 8:42:38 AM See HCP within 4 Hours (or PCP triage) Yes Toni Arthurs, RN, Jenelle Mages Disagree/Comply Comply Caller Understands Yes PreDisposition InappropriateToAsk Care Advice Given Per Guideline SEE HCP (OR PCP TRIAGE) WITHIN 4 HOURS: * IF OFFICE WILL BE OPEN: You need to be seen within the next 3 or 4 hours. Call your doctor (or NP/PA) now or as soon as the office opens. CALL BACK IF: * You become worse Referrals Warm transfer to backlin

## 2023-04-14 NOTE — Addendum Note (Signed)
Addended by: Joaquim Nam on: 04/14/2023 08:04 AM   Modules accepted: Orders

## 2023-04-14 NOTE — Telephone Encounter (Signed)
Per appt notes pt already has appt with Dr Reece Agar 04/14/23 at [facility]. Sending note to DR Esau Grew pool and Dr Para March.

## 2023-04-14 NOTE — Assessment & Plan Note (Signed)
Bigeminy on EKG.  Along with atrial fibs/flutter.

## 2023-04-14 NOTE — Telephone Encounter (Signed)
FYI: This call has been transferred to Access Nurse. Once the result note has been entered staff can address the message at that time.  Patient called (Todd Gonzales) in with the following symptoms:  Red Word: SOB, Nausea, unable to stand up, had UTI earlier in the week, on Cipro, still taking but  UTI symptoms seem to have gone away   Please advise at [facility] [phone]  Message is routed to Provider Pool and [facility] Triage

## 2023-04-14 NOTE — Patient Instructions (Signed)
Nice to see you today I have referred you to the local hospital for further evaluation.

## 2023-04-14 NOTE — Progress Notes (Signed)
Acute Office Visit  Subjective:     Patient ID: Todd Gonzales, male    DOB: 1941/07/24, 82 y.o.   MRN: [id number]  Chief Complaint  Patient presents with   Shortness of Breath    X 1 week   Fatigue     Patient is in today for shortness of breath and fatigue with a history of htn, CAD, vitamin D def,vitamin B12 def, HLD and DOE  States that he is still taking the cipro medication. He is still have 2 weeks left  States he has not had any energy. This has been a long standing thing. States that it has been worse as of late. States that they normally wake up and make the bed but he could not stand that long to make the bed. States that it started on 04/08/2023.  Spouse states that he has not been able to stay awake. States that he could not stay awake enough to go to the mail box. States that he has been taking naps through the day and it has increased he will sleep approx 3 hours a day   States that he is having some shortness of breath. States that it is with exertion. Wife states that he has been complaining of weakness and fatigue.   Review of Systems  Constitutional:  Negative for chills and fever.  Respiratory:  Positive for shortness of breath.   Cardiovascular:  Positive for leg swelling. Negative for chest pain and palpitations.  Genitourinary:  Negative for dysuria.        Objective:    BP 116/78   Pulse 62   Temp 97.7 F (36.5 C)   Resp 16   Ht 5\' 10"  (1.778 m)   Wt 251 lb 6 oz (114 kg)   SpO2 94%   BMI 36.07 kg/m  BP Readings from Last 3 Encounters:  04/14/23 116/78  04/07/23 110/68  03/28/23 124/70   Wt Readings from Last 3 Encounters:  04/14/23 251 lb 6 oz (114 kg)  04/07/23 246 lb (111.6 kg)  03/24/23 245 lb (111.1 kg)      Physical Exam Vitals and nursing note reviewed.  Constitutional:      Appearance: Normal appearance. He is obese.  Cardiovascular:     Rate and Rhythm: Normal rate. Rhythm irregular.  Pulmonary:     Comments: Decreased  in lower lobes bilaterally  Some labored breathing   Musculoskeletal:     Right lower leg: 2+ Pitting Edema present.     Left lower leg: 2+ Pitting Edema present.  Neurological:     Mental Status: He is alert.     No results found for any visits on 04/14/23.      Assessment & Plan:   Problem List Items Addressed This Visit       Cardiovascular and Mediastinum   PVC (premature ventricular contraction)    Bigeminy on EKG.  Along with atrial fibs/flutter.      Atrial flutter (HCC)    New onset patient medically fragile with balance issues already will defer further treatment to local hospital for rate control and rhythm control.  Patient was sent to the hospital via EMS        Other   Shortness of breath - Primary    Multifactorial.  Patient has a history of DOE.  Likely secondary to atrial fibrillation flutter RVR.  Patient also has increased edema and weight gain of 5+ pounds concern for some acute heart failure on top of  the atrial fibrillation.  Patient was sent to emergency department via EMS for further evaluation      Localized edema    2+ bilateral lower extremity edema.  Patient does take hydrochlorothiazide 12.5 mg every other day.      Irregular heartbeat    Irregular heartbeat noted on auscultation.  EKG performed in office      Relevant Orders   EKG 12-Lead (Completed)    No orders of the defined types were placed in this encounter.   Return if symptoms worsen or fail to improve.  Audria Nine, NP

## 2023-04-14 NOTE — H&P (Signed)
History and Physical   Todd Gonzales:[id number] DOB: 12/13/1940 DOA: 04/14/2023  PCP: Joaquim Nam, MD   Patient coming from: PCP  Chief Complaint: Atrial fibrillation with RVR  HPI: Todd Gonzales is a 82 y.o. male with medical history significant of hypertension, hyperlipidemia, spinal stenosis status post laminectomy, BPH presenting from PCP office with atrial fibrillation with RVR.  Patient has had 1 to 2 weeks of fatigue and dyspnea on exertion.  Denies any chest pain in that time.  Did recently complete a course of ciprofloxacin for suspected UTI about a week ago.  Went to see PCP today and was noted to be in atrial fibrillation with RVR and rates in the 120s to 130s.  Was sent to the ED for further evaluation and treatment.  Reports some edema for the past month.  Denies fevers, chills, chest pain, abdominal pain, constipation, diarrhea, nausea, vomiting.  ED Course: Vital signs in the ED notable for heart rate in the 100s to 120s, blood pressure in the 100s to 130s, respiratory rate in the 20s.  Lab workup included CMP which is pending but i-STAT showed no significant acute abnormality.  CBC showed platelets 149.  BN P elevated to 29, troponin pending.  Urinalysis pending.  Chest x-ray showed underinflated exam with atelectasis and slight vascular congestion which was noted as possibly technical.  Patient started on diltiazem drip.  Review of Systems: As per HPI otherwise all other systems reviewed and are negative.  Past Medical History:  Diagnosis Date   Actinic keratosis 03/28/2023   glabella, needs LN2   Arthritis    Basal cell carcinoma 03/28/2023   right paranasal, needs mohs   BPH (benign prostatic hyperplasia)    nocturia x4 at baseline   H/O hiatal hernia    Hemorrhoids    Hyperlipidemia    Hypertension    Dr Wylene Simmer- LOV with clearance 3/13 on chart   IGT (impaired glucose tolerance)    last AIC 5.5- diet controlled- per office note 3/13   SCC (squamous  cell carcinoma) 02/10/2005   scc in situ-left forearm.   SCCA (squamous cell carcinoma) of skin 02/10/2005   Right Outer Eye (in situ) (curet and excision)   SCCA (squamous cell carcinoma) of skin 02/13/2006   Right Ear (well diff) (curet and 5FU)   SCCA (squamous cell carcinoma) of skin 04/05/2006   Left Ear Bowl (in situ) (tx p bx)   SCCA (squamous cell carcinoma) of skin 07/06/2010   Right Cheek (in situ) (curet and zyclara)   SCCA (squamous cell carcinoma) of skin 04/27/2009   Left Upper Forearm (in situ) (curet 5FU)   SCCA (squamous cell carcinoma) of skin 08/28/2014   Right Cheek (well diff)   SCCA (squamous cell carcinoma) of skin 03/21/2016   Left Jawline Ant (well diff) (curet and 5FU)   SCCA (squamous cell carcinoma) of skin 01/19/2017   Right Hand (in situ) (tx p bx)   SCCA (squamous cell carcinoma) of skin 03/04/1840   Left Jawline Inf (in situ) (curet and 5FU)   SCCA (squamous cell carcinoma) of skin 12/23/2020   Left Forehead (well diff) (tx p bx)   SCCA (squamous cell carcinoma) of skin 12/23/2020   Left Submandibular Area (in situ) (tx p bx)   Seasonal allergies    Spinal stenosis    Squamous cell carcinoma of skin 08/31/2020   in situ-left jawline cheek   Squamous cell carcinoma of skin 04/27/2021   infil- left nasal sidewall(MOHS)  Squamous cell carcinoma of skin 03/28/2023   Left medial cheek above nasolabial, needs mohs   Squamous cell carcinoma of skin of face 04/27/2021   infil- Left Temporal scalp (MOHS   UTI (lower urinary tract infection)    required admission to [facility]    Past Surgical History:  Procedure Laterality Date   BACK SURGERY  2008   Laminectomy L4-5   JOINT REPLACEMENT Bilateral    bil   KNEE ARTHROSCOPY     right   LUMBAR LAMINECTOMY/DECOMPRESSION MICRODISCECTOMY Bilateral 11/20/2018   Procedure: Bilateral Thoracic Twelve to Lumbar One Laminectomy;  Surgeon: Barnett Abu, MD;  Location: [facility] OR;  Service: Neurosurgery;  Laterality:  Bilateral;  posterior   TOTAL HIP ARTHROPLASTY  03/20/2012   Procedure: TOTAL HIP ARTHROPLASTY ANTERIOR APPROACH;  Surgeon: Shelda Pal, MD;  Location: [facility] ORS;  Service: Orthopedics;  Laterality: Left;    Social History  reports that he has never smoked. He has never used smokeless tobacco. He reports current alcohol use. He reports that he does not use drugs.  Allergies  Allergen Reactions   Codeine Other (See Comments)    Unknown reaction   Sulfamethoxazole-Trimethoprim     Other reaction(s): light rash over face/chest    Family History  Problem Relation Age of Onset   Arthritis Mother    Lung cancer Father    Asthma Father    Colon cancer Neg Hx    Prostate cancer Neg Hx   Reviewed on admission  Prior to Admission medications   Medication Sig Start Date End Date Taking? Authorizing Provider  acetaminophen (TYLENOL) 325 MG tablet Take 650 mg by mouth every 6 (six) hours as needed.    [provider]  atorvastatin (LIPITOR) 10 MG tablet Take 1 tablet (10 mg total) by mouth daily. 02/22/23   Joaquim Nam, MD  benazepril (LOTENSIN) 10 MG tablet TAKE ONE TABLET BY MOUTH ONCE A DAY 04/10/23   Joaquim Nam, MD  celecoxib (CELEBREX) 200 MG capsule TAKE ONE CAPSULE BY MOUTH TWICE DAILY 03/06/23   Joaquim Nam, MD  cetirizine (ZYRTEC ALLERGY) 10 MG tablet Take 1 tablet (10 mg total) by mouth daily. 12/07/22   Eden Emms, NP  Cholecalciferol (VITAMIN D-1000 MAX ST) 25 MCG (1000 UT) tablet Take 2 tablets (2,000 Units total) by mouth daily. 10/03/19   Joaquim Nam, MD  clobetasol ointment (TEMOVATE) 0.05 % APPLY TO AFFECTED AREAS ON BOTH LEGS TWICE DAILY AFTER SHOWER 06/22/22   Janalyn Harder, MD  cyanocobalamin (VITAMIN B12) 1000 MCG/ML injection Inject 1 mL (1,000 mcg total) into the muscle every 30 (thirty) days. 01/26/23   Joaquim Nam, MD  diclofenac Sodium (VOLTAREN) 1 % GEL Apply to affected joints qid prn 09/05/16   [provider]  emollient  (BIAFINE) cream Apply topically as needed. 06/23/22   Janalyn Harder, MD  fluticasone (FLONASE) 50 MCG/ACT nasal spray Place 2 sprays into both nostrils daily. 12/07/22   Eden Emms, NP  gabapentin (NEURONTIN) 300 MG capsule TAKE ONE CAPSULE BY MOUTH ONCE DAILY 04/02/23   Joaquim Nam, MD  hydrochlorothiazide (HYDRODIURIL) 12.5 MG tablet Take 1 tablet (12.5 mg total) by mouth every other day. 04/07/23   Joaquim Nam, MD  hydrocortisone (PROCTOSOL HC) 2.5 % rectal cream Place 1 application. rectally 2 (two) times daily. 04/07/22   Joaquim Nam, MD  tamsulosin (FLOMAX) 0.4 MG CAPS capsule TAKE TWO CAPSULE BY MOUTH DAILY 04/10/23   Joaquim Nam, MD  traMADol (ULTRAM) 50 MG tablet Take 1 tablet (50 mg total) by mouth every 12 (twelve) hours as needed. 08/25/22   Joaquim Nam, MD    Physical Exam: Vitals:   04/14/23 1550 04/14/23 1600 04/14/23 1640 04/14/23 1642  BP: 118/86 (!) 121/99 119/61   Pulse: 100 60  85  Resp: (!) 26 (!) 29 (!) 29   Temp:      TempSrc:      SpO2: 97% 95%      Physical Exam Constitutional:      General: He is not in acute distress.    Appearance: Normal appearance.  HENT:     Head: Normocephalic and atraumatic.     Mouth/Throat:     Mouth: Mucous membranes are moist.     Pharynx: Oropharynx is clear.  Eyes:     Extraocular Movements: Extraocular movements intact.     Pupils: Pupils are equal, round, and reactive to light.  Cardiovascular:     Rate and Rhythm: Tachycardia present. Rhythm irregular.     Pulses: Normal pulses.     Heart sounds: Normal heart sounds.  Pulmonary:     Effort: Pulmonary effort is normal. No respiratory distress.     Breath sounds: Rales (Trace) present.  Abdominal:     General: Bowel sounds are normal. There is no distension.     Palpations: Abdomen is soft.     Tenderness: There is no abdominal tenderness.  Musculoskeletal:        General: No swelling or deformity.     Right lower leg: Edema present.     Left  lower leg: Edema present.  Skin:    General: Skin is warm and dry.  Neurological:     General: No focal deficit present.     Mental Status: Mental status is at baseline.    Labs on Admission: I have personally reviewed following labs and imaging studies  CBC: Recent Labs  Lab 04/14/23 1420 04/14/23 1442  WBC 8.8  --   NEUTROABS 6.8  --   HGB 14.0 14.6  HCT 44.6 43.0  MCV 104.2*  --   PLT 149*  --     Basic Metabolic Panel: Recent Labs  Lab 04/14/23 1420 04/14/23 1442  NA 140 140  K 4.1 4.1  CL 103 104  CO2 27  --   GLUCOSE 112* 113*  BUN 22 27*  CREATININE 1.05 1.00  CALCIUM 8.4*  --     GFR: Estimated Creatinine Clearance: 69.5 mL/min (by C-G formula based on SCr of 1 mg/dL).  Liver Function Tests: Recent Labs  Lab 04/14/23 1420  AST 35  ALT 38  ALKPHOS 40  BILITOT 2.1*  PROT 6.4*  ALBUMIN 3.8    Urine analysis:    Component Value Date/Time   COLORURINE YELLOW 04/07/2023 1257   APPEARANCEUR CLEAR 04/07/2023 1257   APPEARANCEUR Hazy 06/11/2013 1519   LABSPEC 1.017 04/07/2023 1257   LABSPEC 1.015 06/11/2013 1519   PHURINE < OR = 5.0 04/07/2023 1257   GLUCOSEU NEGATIVE 04/07/2023 1257   GLUCOSEU Negative 06/11/2013 1519   HGBUR NEGATIVE 04/07/2023 1257   BILIRUBINUR neg 12/26/2022 1700   BILIRUBINUR Negative 06/11/2013 1519   KETONESUR TRACE (A) 04/07/2023 1257   PROTEINUR TRACE (A) 04/07/2023 1257   UROBILINOGEN 0.2 (A) 12/26/2022 1700   UROBILINOGEN 1.0 03/14/2012 1200   NITRITE NEGATIVE 04/07/2023 1257   LEUKOCYTESUR 1+ (A) 04/07/2023 1257   LEUKOCYTESUR 3+ 06/11/2013 1519    Radiological Exams on Admission: DG  Chest Port 1 View  Result Date: 04/14/2023 CLINICAL DATA:  Shortness of breath EXAM: PORTABLE CHEST 1 VIEW COMPARISON:  X-ray 12/26/2022 FINDINGS: Underinflation. Borderline cardiopericardial silhouette with vascular congestion and bronchovascular crowding. Minimal basilar atelectasis. No pneumothorax or edema. Overlapping cardiac  leads. Film is under penetrated. IMPRESSION: Underinflation with bronchovascular crowding and atelectasis. Slight central vascular congestion but this could be technical. Please correlate with symptoms. If needed follow up x-ray with improved inflation when clinically appropriate Electronically Signed   By: Karen Kays M.D.   On: 04/14/2023 15:09    EKG: Independently reviewed.  Atrial fibrillation with VR at 106 bpm.  PVCs noted.  Assessment/Plan Principal Problem:   Atrial fibrillation with RVR (HCC) Active Problems:   HTN (hypertension)   BPH (benign prostatic hyperplasia)   Spinal stenosis   HLD (hyperlipidemia)   Atrial fibrillation with RVR > New onset, noted PCPs office today sent to the ED for further evaluation.  Has had 1 to 2 weeks of fatigue and dyspnea on exertion. > Started on diltiazem drip in the ED with some improvement in rates, blood pressure currently still tolerating in the 100s to 110s systolic. - Monitor on progressive unit overnight - Discontinue diltiazem drip in favor of amiodarone drip given concern for possible developing CHF as below - Start Eliquis - Echocardiogram - Referral to A-fib clinic  Volume overload versus CHF > Mildly elevated BNP.  Edema, trace rales. > May be volume overload in the setting of A-fib with RVR - Hold off on diuretic for now given lower blood pressures - Will plan to switch to amiodarone with possibility of developing heart failure - Follow-up echocardiogram  Hypertension - Holding home benazepril and hydrochlorothiazide in the setting of diltiazem drip and possible medication adjustment for new onset A-fib as above.  Hyperlipidemia - Continue home atorvastatin  BPH - Continue home Flomax  Spinal stenosis > Status post laminectomy   DVT prophylaxis: Eliquis Code Status:   Full Family Communication:  None on admission  Disposition Plan:   Patient is from:  Home  Anticipated DC to:  Home  Anticipated DC date:  1 to 3  days  Anticipated DC barriers: None  Consults called:  None Admission status:  Observation, progressive  Severity of Illness: The appropriate patient status for this patient is OBSERVATION. Observation status is judged to be reasonable and necessary in order to provide the required intensity of service to ensure the patient's safety. The patient's presenting symptoms, physical exam findings, and initial radiographic and laboratory data in the context of their medical condition is felt to place them at decreased risk for further clinical deterioration. Furthermore, it is anticipated that the patient will be medically stable for discharge from the hospital within 2 midnights of admission.    Synetta Fail MD [facility]  How to contact the [facility] Attending or Consulting provider 7A - 7P or covering provider during after hours 7P -7A, for this patient?   Check the care team in [facility] and look for a) attending/consulting [facility] provider listed and b) the [facility] team listed Log into [email or web address] and use [facility] universal password to access. If you do not have the password, please contact the hospital operator. Locate the [facility] provider you are looking for under [facility] and page to a number that you can be directly reached. If you still have difficulty reaching the provider, please page the [facility] (Director on Call) for the Hospitalists listed on amion for assistance.  04/14/2023, 5:00 PM

## 2023-04-14 NOTE — Telephone Encounter (Signed)
I put in the referral but please triage patient in the meantime.  Thanks.

## 2023-04-14 NOTE — Assessment & Plan Note (Signed)
New onset patient medically fragile with balance issues already will defer further treatment to local hospital for rate control and rhythm control.  Patient was sent to the hospital via EMS

## 2023-04-14 NOTE — ED Provider Notes (Signed)
EMERGENCY DEPARTMENT AT [facility] Provider Note   CSN: [id number] Arrival date & time: 04/14/23  1403     History  Chief Complaint  Patient presents with   Irregular Heart Beat   Weakness    Todd Gonzales is a 82 y.o. male.  82 year old male with prior medical history as detailed below presents for evaluation.  Patient sent from PCPs office.  Patient was found to be in new onset atrial fibrillation.  Patient without prior cardiac history or history of arrhythmia.  Patient admits to at least 1 to 2 weeks of persistent fatigue and dyspnea with exertion.  He denies chest pain.  He denies fever.  Patient reports that he completed a course of Cipro for possible UTI approximately 1 week ago.  Today, in his PCPs office he was noted to be in A-fib with RVR.  Patient is not anticoagulated at baseline.    The history is provided by the patient and medical records.       Home Medications Prior to Admission medications   Medication Sig Start Date End Date Taking? Authorizing Provider  acetaminophen (TYLENOL) 325 MG tablet Take 650 mg by mouth every 6 (six) hours as needed.    [provider]  atorvastatin (LIPITOR) 10 MG tablet Take 1 tablet (10 mg total) by mouth daily. 02/22/23   Joaquim Nam, MD  benazepril (LOTENSIN) 10 MG tablet TAKE ONE TABLET BY MOUTH ONCE A DAY 04/10/23   Joaquim Nam, MD  celecoxib (CELEBREX) 200 MG capsule TAKE ONE CAPSULE BY MOUTH TWICE DAILY 03/06/23   Joaquim Nam, MD  cetirizine (ZYRTEC ALLERGY) 10 MG tablet Take 1 tablet (10 mg total) by mouth daily. 12/07/22   Eden Emms, NP  Cholecalciferol (VITAMIN D-1000 MAX ST) 25 MCG (1000 UT) tablet Take 2 tablets (2,000 Units total) by mouth daily. 10/03/19   Joaquim Nam, MD  ciprofloxacin (CIPRO) 500 MG tablet Take 1 tablet (500 mg total) by mouth 2 (two) times daily. 04/07/23   Joaquim Nam, MD  clobetasol ointment (TEMOVATE) 0.05 % APPLY TO AFFECTED AREAS ON  BOTH LEGS TWICE DAILY AFTER SHOWER 06/22/22   Janalyn Harder, MD  cyanocobalamin (VITAMIN B12) 1000 MCG/ML injection Inject 1 mL (1,000 mcg total) into the muscle every 30 (thirty) days. 01/26/23   Joaquim Nam, MD  diclofenac Sodium (VOLTAREN) 1 % GEL Apply to affected joints qid prn 09/05/16   [provider]  emollient (BIAFINE) cream Apply topically as needed. 06/23/22   Janalyn Harder, MD  fluticasone (FLONASE) 50 MCG/ACT nasal spray Place 2 sprays into both nostrils daily. 12/07/22   Eden Emms, NP  gabapentin (NEURONTIN) 300 MG capsule TAKE ONE CAPSULE BY MOUTH ONCE DAILY 04/02/23   Joaquim Nam, MD  hydrochlorothiazide (HYDRODIURIL) 12.5 MG tablet Take 1 tablet (12.5 mg total) by mouth every other day. 04/07/23   Joaquim Nam, MD  hydrocortisone (PROCTOSOL HC) 2.5 % rectal cream Place 1 application. rectally 2 (two) times daily. 04/07/22   Joaquim Nam, MD  tamsulosin (FLOMAX) 0.4 MG CAPS capsule TAKE TWO CAPSULE BY MOUTH DAILY 04/10/23   Joaquim Nam, MD  traMADol (ULTRAM) 50 MG tablet Take 1 tablet (50 mg total) by mouth every 12 (twelve) hours as needed. 08/25/22   Joaquim Nam, MD      Allergies    Codeine and Sulfamethoxazole-trimethoprim    Review of Systems   Review of Systems  All other systems  reviewed and are negative.   Physical Exam Updated Vital Signs BP (!) 130/104   Pulse (!) 52   Temp 98.2 F (36.8 C) (Oral)   Resp (!) 21   SpO2 97%  Physical Exam Vitals and nursing note reviewed.  Constitutional:      General: He is not in acute distress.    Appearance: Normal appearance. He is well-developed.  HENT:     Head: Normocephalic and atraumatic.  Eyes:     Conjunctiva/sclera: Conjunctivae normal.     Pupils: Pupils are equal, round, and reactive to light.  Cardiovascular:     Rate and Rhythm: Tachycardia present. Rhythm irregular.     Heart sounds: Normal heart sounds.  Pulmonary:     Effort: Pulmonary effort is normal. No  respiratory distress.     Breath sounds: Normal breath sounds.  Abdominal:     General: There is no distension.     Palpations: Abdomen is soft.     Tenderness: There is no abdominal tenderness.  Musculoskeletal:        General: No deformity. Normal range of motion.     Cervical back: Normal range of motion and neck supple.  Skin:    General: Skin is warm and dry.  Neurological:     General: No focal deficit present.     Mental Status: He is alert and oriented to person, place, and time.     ED Results / Procedures / Treatments   Labs (all labs ordered are listed, but only abnormal results are displayed) Labs Reviewed  I-STAT CHEM 8, ED - Abnormal; Notable for the following components:      Result Value   BUN 27 (*)    Glucose, Bld 113 (*)    Calcium, Ion 1.01 (*)    All other components within normal limits  CBC WITH DIFFERENTIAL/PLATELET  BRAIN NATRIURETIC PEPTIDE  COMPREHENSIVE METABOLIC PANEL  TROPONIN I (HIGH SENSITIVITY)    EKG EKG Interpretation  Date/Time:  Friday Apr 14 2023 14:13:19 EDT Ventricular Rate:  106 PR Interval:    QRS Duration: 105 QT Interval:  320 QTC Calculation: 425 R Axis:   34 Text Interpretation: Atrial fibrillation Ventricular bigeminy Low voltage, precordial leads Probable anteroseptal infarct, old Confirmed by Kristine Royal [phone]) on 04/14/2023 2:22:33 PM  Radiology No results found.  Procedures Procedures    Medications Ordered in ED Medications  diltiazem (CARDIZEM) 125 mg in dextrose 5% 125 mL (1 mg/mL) infusion (5 mg/hr Intravenous New Bag/Given 04/14/23 1442)    ED Course/ Medical Decision Making/ A&P                             Medical Decision Making Amount and/or Complexity of Data Reviewed Labs: ordered. Radiology: ordered.  Risk Prescription drug management.    Medical Screen Complete  This patient presented to the ED with complaint of weakness, atrial fibrillation.  This complaint involves an  extensive number of treatment options. The initial differential diagnosis includes, but is not limited to, arrhythmia, metabolic abnormality, etc.  This presentation is: Acute, Chronic, Self-Limited, Previously Undiagnosed, Uncertain Prognosis, Complicated, Systemic Symptoms, and Threat to Life/Bodily Function  Patient is presenting from his clinic with newly diagnosed atrial fibrillation with RVR.  Patient without history of same.  Patient is not currently anticoagulated for other reason.  Patient noted to be mildly tachycardic into the 120s.  Patient's blood pressure is preserved.  Patient without hypoxia.  Patient would benefit  from admission for workup of atrial fibrillation and treatment of same.  Cardizem drip initiated in ED for rate control.  Hospitalist service made aware case will evaluate for admission.    Additional history obtained:  External records from outside sources obtained and reviewed including prior ED visits and prior Inpatient records.    Lab Tests:  I ordered and personally interpreted labs.  The pertinent results include: CBC, CMP, BNP, troponin, i-STAT Chem-8   Imaging Studies ordered:  I ordered imaging studies including chest x-ray I independently visualized and interpreted obtained imaging which showed NAD I agree with the radiologist interpretation.   Cardiac Monitoring:  The patient was maintained on a cardiac monitor.  I personally viewed and interpreted the cardiac monitor which showed an underlying rhythm of: A-fib with RVR   Medicines ordered:  I ordered medication including Cardizem drip for A-fib with RVR Reevaluation of the patient after these medicines showed that the patient: improved   Problem List / ED Course:  New onset A-fib with RVR   Reevaluation:  After the interventions noted above, I reevaluated the patient and found that they have: improved  Disposition:  After consideration of the diagnostic results and the  patients response to treatment, I feel that the patent would benefit from admission.          Final Clinical Impression(s) / ED Diagnoses Final diagnoses:  Atrial fibrillation, unspecified type [facility])    Rx / DC Orders ED Discharge Orders     None         Wynetta Fines, MD 04/14/23 1556

## 2023-04-14 NOTE — Telephone Encounter (Signed)
See OV note.  Thanks.  

## 2023-04-14 NOTE — Assessment & Plan Note (Signed)
Multifactorial.  Patient has a history of DOE.  Likely secondary to atrial fibrillation flutter RVR.  Patient also has increased edema and weight gain of 5+ pounds concern for some acute heart failure on top of the atrial fibrillation.  Patient was sent to emergency department via EMS for further evaluation

## 2023-04-14 NOTE — Assessment & Plan Note (Signed)
Irregular heartbeat noted on auscultation.  EKG performed in office

## 2023-04-14 NOTE — Telephone Encounter (Signed)
Please see separate note 04/14/23; pt's wife called this morning and Amy has already sent Harriett Sine to access nurse for triage. When access nurse note available will send to Dr Para March.

## 2023-04-15 ENCOUNTER — Observation Stay (HOSPITAL_COMMUNITY): Payer: Medicare Other

## 2023-04-15 ENCOUNTER — Other Ambulatory Visit (HOSPITAL_COMMUNITY): Payer: Medicare Other

## 2023-04-15 DIAGNOSIS — D539 Nutritional anemia, unspecified: Secondary | ICD-10-CM | POA: Diagnosis present

## 2023-04-15 DIAGNOSIS — R0600 Dyspnea, unspecified: Secondary | ICD-10-CM | POA: Diagnosis not present

## 2023-04-15 DIAGNOSIS — Z79899 Other long term (current) drug therapy: Secondary | ICD-10-CM | POA: Diagnosis not present

## 2023-04-15 DIAGNOSIS — I10 Essential (primary) hypertension: Secondary | ICD-10-CM | POA: Diagnosis present

## 2023-04-15 DIAGNOSIS — N400 Enlarged prostate without lower urinary tract symptoms: Secondary | ICD-10-CM | POA: Diagnosis present

## 2023-04-15 DIAGNOSIS — E785 Hyperlipidemia, unspecified: Secondary | ICD-10-CM | POA: Diagnosis present

## 2023-04-15 DIAGNOSIS — Z6836 Body mass index (BMI) 36.0-36.9, adult: Secondary | ICD-10-CM | POA: Diagnosis not present

## 2023-04-15 DIAGNOSIS — E669 Obesity, unspecified: Secondary | ICD-10-CM | POA: Diagnosis present

## 2023-04-15 DIAGNOSIS — R600 Localized edema: Secondary | ICD-10-CM

## 2023-04-15 DIAGNOSIS — Z96649 Presence of unspecified artificial hip joint: Secondary | ICD-10-CM | POA: Diagnosis present

## 2023-04-15 DIAGNOSIS — I4891 Unspecified atrial fibrillation: Secondary | ICD-10-CM | POA: Diagnosis present

## 2023-04-15 DIAGNOSIS — Z8261 Family history of arthritis: Secondary | ICD-10-CM | POA: Diagnosis not present

## 2023-04-15 DIAGNOSIS — Z882 Allergy status to sulfonamides status: Secondary | ICD-10-CM | POA: Diagnosis not present

## 2023-04-15 DIAGNOSIS — Z85828 Personal history of other malignant neoplasm of skin: Secondary | ICD-10-CM | POA: Diagnosis not present

## 2023-04-15 DIAGNOSIS — D696 Thrombocytopenia, unspecified: Secondary | ICD-10-CM | POA: Diagnosis present

## 2023-04-15 DIAGNOSIS — E876 Hypokalemia: Secondary | ICD-10-CM | POA: Diagnosis present

## 2023-04-15 DIAGNOSIS — I480 Paroxysmal atrial fibrillation: Secondary | ICD-10-CM | POA: Diagnosis present

## 2023-04-15 DIAGNOSIS — Z825 Family history of asthma and other chronic lower respiratory diseases: Secondary | ICD-10-CM | POA: Diagnosis not present

## 2023-04-15 DIAGNOSIS — Z885 Allergy status to narcotic agent status: Secondary | ICD-10-CM | POA: Diagnosis not present

## 2023-04-15 DIAGNOSIS — R7989 Other specified abnormal findings of blood chemistry: Secondary | ICD-10-CM | POA: Diagnosis present

## 2023-04-15 DIAGNOSIS — J9811 Atelectasis: Secondary | ICD-10-CM | POA: Diagnosis present

## 2023-04-15 DIAGNOSIS — Z801 Family history of malignant neoplasm of trachea, bronchus and lung: Secondary | ICD-10-CM | POA: Diagnosis not present

## 2023-04-15 DIAGNOSIS — R110 Nausea: Secondary | ICD-10-CM | POA: Diagnosis not present

## 2023-04-15 LAB — COMPREHENSIVE METABOLIC PANEL
ALT: 37 U/L (ref 0–44)
AST: 24 U/L (ref 15–41)
Albumin: 3.5 g/dL (ref 3.5–5.0)
Alkaline Phosphatase: 38 U/L (ref 38–126)
Anion gap: 8 (ref 5–15)
BUN: 17 mg/dL (ref 8–23)
CO2: 31 mmol/L (ref 22–32)
Calcium: 8.5 mg/dL — ABNORMAL LOW (ref 8.9–10.3)
Chloride: 102 mmol/L (ref 98–111)
Creatinine, Ser: 0.99 mg/dL (ref 0.61–1.24)
GFR, Estimated: >60 mL/min (ref >60)
Glucose, Bld: 131 mg/dL — ABNORMAL HIGH (ref 70–99)
Potassium: 3.3 mmol/L — ABNORMAL LOW (ref 3.5–5.1)
Sodium: 141 mmol/L (ref 135–145)
Total Bilirubin: 1.5 mg/dL — ABNORMAL HIGH (ref 0.3–1.2)
Total Protein: 6 g/dL — ABNORMAL LOW (ref 6.5–8.1)

## 2023-04-15 LAB — CBC
HCT: 41.1 % (ref 39.0–52.0)
Hemoglobin: 12.9 g/dL — ABNORMAL LOW (ref 13.0–17.0)
MCH: 32 pg (ref 26.0–34.0)
MCHC: 31.4 g/dL (ref 30.0–36.0)
MCV: 102 fL — ABNORMAL HIGH (ref 80.0–100.0)
Platelets: 141 10*3/uL — ABNORMAL LOW (ref 150–400)
RBC: 4.03 MIL/uL — ABNORMAL LOW (ref 4.22–5.81)
RDW: 13.2 % (ref 11.5–15.5)
WBC: 8.7 10*3/uL (ref 4.0–10.5)
nRBC: 0 % (ref 0.0–0.2)

## 2023-04-15 LAB — ECHOCARDIOGRAM COMPLETE
Area-P 1/2: 4.6 cm2
Calc EF: 60.3 %
Height: 70 in
S' Lateral: 3.4 cm
Single Plane A2C EF: 63.6 %
Single Plane A4C EF: 57.1 %
Weight: 4051.2 oz

## 2023-04-15 LAB — MAGNESIUM: Magnesium: 2.1 mg/dL (ref 1.7–2.4)

## 2023-04-15 MED ORDER — SORBITOL 70 % SOLN
960.0000 mL | TOPICAL_OIL | Freq: Once | ORAL | Status: AC
  Administered 2023-04-15: 960 mL via RECTAL
  Filled 2023-04-15 (×3): qty 240

## 2023-04-15 MED ORDER — POTASSIUM CHLORIDE CRYS ER 20 MEQ PO TBCR
40.0000 meq | EXTENDED_RELEASE_TABLET | Freq: Two times a day (BID) | ORAL | Status: AC
  Administered 2023-04-15 (×2): 40 meq via ORAL
  Filled 2023-04-15 (×2): qty 2

## 2023-04-15 MED ORDER — FUROSEMIDE 10 MG/ML IJ SOLN
40.0000 mg | Freq: Once | INTRAMUSCULAR | Status: AC
  Administered 2023-04-15: 40 mg via INTRAVENOUS
  Filled 2023-04-15: qty 4

## 2023-04-15 MED ORDER — BISACODYL 10 MG RE SUPP
10.0000 mg | Freq: Once | RECTAL | Status: AC
  Administered 2023-04-15: 10 mg via RECTAL
  Filled 2023-04-15: qty 1

## 2023-04-15 MED ORDER — PERFLUTREN LIPID MICROSPHERE
1.0000 mL | INTRAVENOUS | Status: AC | PRN
  Administered 2023-04-15: 3 mL via INTRAVENOUS

## 2023-04-15 NOTE — Progress Notes (Signed)
[facility] PROGRESS NOTE   Todd Gonzales WUJ:[id number] DOB: January 30, 1941 DOA: 04/14/2023  PCP: Joaquim Nam, MD  Brief History/Interval Summary:  82 y.o. male with medical history significant for hypertension, hyperlipidemia, spinal stenosis status post laminectomy, BPH presenting from PCP office with atrial fibrillation with RVR.  Consultants: None yet  Procedures: Echocardiogram is pending    Subjective/Interval History: Patient complains of shortness of breath this morning.  Denies any chest pain.  No dizziness or lightheadedness.  Does admit to some swelling in his legs.  Mentions that he has been having difficulty breathing for a while and he is supposed to see a cardiologist in the next of couple of months.    Assessment/Plan:  Paroxysmal atrial fibrillation with RVR, new onset Patient was initially placed on diltiazem infusion and then transition to amiodarone infusion.  He was started on Eliquis. He appears to have converted to sinus rhythm this morning.  Will continue amiodarone through today and then transition to oral amiodarone tomorrow. Continue with Eliquis. Echocardiogram is pending. Check thyroid function test.  Dyspnea Has been ongoing for a few weeks.  He has an appointment to see cardiology on July 1.  He does appear to have lower extremity edema.  BNP is mildly elevated.  Could be suggestive of volume overload.  Will give him 1 dose of furosemide since his renal function is normal and his blood pressures are stable.  Follow-up on echocardiogram. Chest x-ray suggested only atelectasis.  Essential hypertension Holding benazepril and HCTZ.  Hypokalemia Will be repleted.  Check magnesium level.  Macrocytic anemia Check anemia panel.  Mild thrombocytopenia Monitor closely.  No evidence of bleeding.  Hyperlipidemia Continue statin.  History of BPH Continue Flomax  History of spinal stenosis status  postlaminectomy Stable.  Obesity Estimated body mass index is 36.33 kg/m as calculated from the following:   Height as of this encounter: 5\' 10"  (1.778 m).   Weight as of this encounter: 114.9 kg.   DVT Prophylaxis: On Eliquis now Code Status: Full code Family Communication: Discussed with the patient.  No family at bedside. Disposition Plan: To be determined  Status is: Observation The patient will require care spanning > 2 midnights and should be moved to inpatient because: Atrial fibrillation, concern for CHF     Medications: Scheduled:  apixaban  5 mg Oral BID   atorvastatin  10 mg Oral Daily   furosemide  40 mg Intravenous Once   gabapentin  300 mg Oral QHS   hydrocortisone cream   Topical TID   potassium chloride  40 mEq Oral BID   sodium chloride flush  3 mL Intravenous Q12H   tamsulosin  0.4 mg Oral Daily   Continuous:  amiodarone 30 mg/hr (04/15/23 0559)   NFA:[id number] **OR** acetaminophen, perflutren lipid microspheres (DEFINITY) IV suspension, polyethylene glycol, sodium chloride  Antibiotics: Anti-infectives (From admission, onward)    None       Objective:  Vital Signs  Vitals:   04/14/23 1745 04/14/23 2025 04/15/23 0011 04/15/23 0423  BP: 119/89 131/84 (!) 146/93 133/69  Pulse: (!) 114 82 83 81  Resp: 20 20 18 20   Temp: 98.6 F (37 C) 98.2 F (36.8 C) 97.8 F (36.6 C) 98.2 F (36.8 C)  TempSrc: Oral Oral Oral Oral  SpO2: 95% 93% 92% 93%  Weight: 114.9 kg     Height: 5\' 10"  (1.778 m)       Intake/Output Summary (Last 24 hours) at 04/15/2023 2952 Last data filed at 04/15/2023  0559 Gross per 24 hour  Intake 359.55 ml  Output 100 ml  Net 259.55 ml   Filed Weights   04/14/23 1745  Weight: 114.9 kg    General appearance: Awake alert.  In no distress Resp: Noted to be tachypneic without use of accessory muscles.  Crackles bilateral bases.  No wheezing or rhonchi. Cardio: S1-S2 is normal regular.  No S3-S4.  No rubs murmurs or  bruit GI: Abdomen is soft.  Nontender nondistended.  Bowel sounds are present normal.  No masses organomegaly Extremities: 1+ pitting edema bilateral lower extremities. Neurologic: Alert and oriented x3.  No focal neurological deficits.    Lab Results:  Data Reviewed: I have personally reviewed following labs and reports of the imaging studies  CBC: Recent Labs  Lab 04/14/23 1420 04/14/23 1442 04/15/23 0220  WBC 8.8  --  8.7  NEUTROABS 6.8  --   --   HGB 14.0 14.6 12.9*  HCT 44.6 43.0 41.1  MCV 104.2*  --  102.0*  PLT 149*  --  141*    Basic Metabolic Panel: Recent Labs  Lab 04/14/23 1420 04/14/23 1442 04/15/23 0220  NA 140 140 141  K 4.1 4.1 3.3*  CL 103 104 102  CO2 27  --  31  GLUCOSE 112* 113* 131*  BUN 22 27* 17  CREATININE 1.05 1.00 0.99  CALCIUM 8.4*  --  8.5*    GFR: Estimated Creatinine Clearance: 70.5 mL/min (by C-G formula based on SCr of 0.99 mg/dL).  Liver Function Tests: Recent Labs  Lab 04/14/23 1420 04/15/23 0220  AST 35 24  ALT 38 37  ALKPHOS 40 38  BILITOT 2.1* 1.5*  PROT 6.4* 6.0*  ALBUMIN 3.8 3.5      Recent Results (from the past 240 hour(s))  Urine Culture     Status: None   Collection Time: 04/07/23 12:57 PM   Specimen: Urine  Result Value Ref Range Status   MICRO NUMBER: [id number]  Final   SPECIMEN QUALITY: Adequate  Final   Sample Source URINE  Final   STATUS: FINAL  Final   Result: No Growth  Final  MICROSCOPIC MESSAGE     Status: None   Collection Time: 04/07/23 12:57 PM  Result Value Ref Range Status   Note   Final    Comment: This urine was analyzed for the presence of WBC,  RBC, bacteria, casts, and other formed elements.  Only those elements seen were reported. . .       Radiology Studies: DG Chest Port 1 View  Result Date: 04/14/2023 CLINICAL DATA:  Shortness of breath EXAM: PORTABLE CHEST 1 VIEW COMPARISON:  X-ray 12/26/2022 FINDINGS: Underinflation. Borderline cardiopericardial silhouette with  vascular congestion and bronchovascular crowding. Minimal basilar atelectasis. No pneumothorax or edema. Overlapping cardiac leads. Film is under penetrated. IMPRESSION: Underinflation with bronchovascular crowding and atelectasis. Slight central vascular congestion but this could be technical. Please correlate with symptoms. If needed follow up x-ray with improved inflation when clinically appropriate Electronically Signed   By: Karen Kays M.D.   On: 04/14/2023 15:09       LOS: 0 days   Albert Devaul Rito Ehrlich  [facility] Pager on [email or web address]  04/15/2023, 9:04 AM

## 2023-04-16 ENCOUNTER — Encounter: Payer: Self-pay | Admitting: Physician Assistant

## 2023-04-16 DIAGNOSIS — I4891 Unspecified atrial fibrillation: Secondary | ICD-10-CM | POA: Diagnosis not present

## 2023-04-16 LAB — LIPID PANEL
Cholesterol: 109 mg/dL (ref 0–200)
HDL: 41 mg/dL (ref >40)
LDL Cholesterol: 58 mg/dL (ref 0–99)
Total CHOL/HDL Ratio: 2.7 RATIO
Triglycerides: 51 mg/dL (ref <150)
VLDL: 10 mg/dL (ref 0–40)

## 2023-04-16 LAB — IRON AND TIBC
Iron: 24 ug/dL — ABNORMAL LOW (ref 45–182)
Saturation Ratios: 8 % — ABNORMAL LOW (ref 17.9–39.5)
TIBC: 294 ug/dL (ref 250–450)
UIBC: 270 ug/dL

## 2023-04-16 LAB — RETICULOCYTES
Immature Retic Fract: 13.7 % (ref 2.3–15.9)
RBC.: 3.96 MIL/uL — ABNORMAL LOW (ref 4.22–5.81)
Retic Count, Absolute: 60.2 10*3/uL (ref 19.0–186.0)
Retic Ct Pct: 1.5 % (ref 0.4–3.1)

## 2023-04-16 LAB — FOLATE: Folate: 12.8 ng/mL (ref >5.9)

## 2023-04-16 LAB — BASIC METABOLIC PANEL
Anion gap: 10 (ref 5–15)
BUN: 17 mg/dL (ref 8–23)
CO2: 29 mmol/L (ref 22–32)
Calcium: 8.4 mg/dL — ABNORMAL LOW (ref 8.9–10.3)
Chloride: 101 mmol/L (ref 98–111)
Creatinine, Ser: 1.03 mg/dL (ref 0.61–1.24)
GFR, Estimated: >60 mL/min (ref >60)
Glucose, Bld: 112 mg/dL — ABNORMAL HIGH (ref 70–99)
Potassium: 3.5 mmol/L (ref 3.5–5.1)
Sodium: 140 mmol/L (ref 135–145)

## 2023-04-16 LAB — CBC
HCT: 39.8 % (ref 39.0–52.0)
Hemoglobin: 12.9 g/dL — ABNORMAL LOW (ref 13.0–17.0)
MCH: 32.9 pg (ref 26.0–34.0)
MCHC: 32.4 g/dL (ref 30.0–36.0)
MCV: 101.5 fL — ABNORMAL HIGH (ref 80.0–100.0)
Platelets: 137 10*3/uL — ABNORMAL LOW (ref 150–400)
RBC: 3.92 MIL/uL — ABNORMAL LOW (ref 4.22–5.81)
RDW: 13.3 % (ref 11.5–15.5)
WBC: 8.3 10*3/uL (ref 4.0–10.5)
nRBC: 0 % (ref 0.0–0.2)

## 2023-04-16 LAB — MAGNESIUM: Magnesium: 2 mg/dL (ref 1.7–2.4)

## 2023-04-16 LAB — T4, FREE: Free T4: 1.02 ng/dL (ref 0.61–1.12)

## 2023-04-16 LAB — VITAMIN B12: Vitamin B-12: 410 pg/mL (ref 180–914)

## 2023-04-16 LAB — FERRITIN: Ferritin: 138 ng/mL (ref 24–336)

## 2023-04-16 LAB — TSH: TSH: 1.446 u[IU]/mL (ref 0.350–4.500)

## 2023-04-16 MED ORDER — AMIODARONE HCL 200 MG PO TABS
200.0000 mg | ORAL_TABLET | Freq: Two times a day (BID) | ORAL | Status: DC
  Administered 2023-04-16: 200 mg via ORAL
  Filled 2023-04-16: qty 1

## 2023-04-16 MED ORDER — POTASSIUM CHLORIDE CRYS ER 20 MEQ PO TBCR: 20.0000 meq | EXTENDED_RELEASE_TABLET | Freq: Every day | ORAL | 2 refills | Status: DC

## 2023-04-16 MED ORDER — POTASSIUM CHLORIDE CRYS ER 20 MEQ PO TBCR
40.0000 meq | EXTENDED_RELEASE_TABLET | Freq: Three times a day (TID) | ORAL | Status: DC
  Administered 2023-04-16: 40 meq via ORAL
  Filled 2023-04-16: qty 2

## 2023-04-16 MED ORDER — FUROSEMIDE 40 MG PO TABS: 40.0000 mg | ORAL_TABLET | Freq: Every day | ORAL | 2 refills | Status: DC

## 2023-04-16 MED ORDER — FUROSEMIDE 10 MG/ML IJ SOLN
40.0000 mg | Freq: Once | INTRAMUSCULAR | Status: AC
  Administered 2023-04-16: 40 mg via INTRAVENOUS
  Filled 2023-04-16: qty 4

## 2023-04-16 MED ORDER — POLYETHYLENE GLYCOL 3350 17 G PO PACK: 17.0000 g | PACK | Freq: Every day | ORAL | 0 refills | Status: DC | PRN

## 2023-04-16 MED ORDER — AMIODARONE HCL 200 MG PO TABS: ORAL_TABLET | ORAL | 1 refills | Status: DC

## 2023-04-16 MED ORDER — APIXABAN 5 MG PO TABS: 5.0000 mg | ORAL_TABLET | Freq: Two times a day (BID) | ORAL | 2 refills | Status: DC

## 2023-04-16 NOTE — Progress Notes (Signed)
Rec'd msg to [employer] inbox to arrange earlier f/u than July when appt is scheduled with Dr. Herbie Baltimore. New to [facility]. Patient was discharged already. Request forwarded to scheduling team to move up new patient appt and contact patient with appt info.  Addendum: received msg from scheduler "Patient only wants to see Dr. Herbie Baltimore, as of right now there is nothing available sooner. He is on Dr. Elissa Hefty wait list. "

## 2023-04-16 NOTE — TOC Transition Note (Signed)
Transition of Care (TOC) - CM/SW Discharge Note Donn Pierini RN, BSN Transitions of Care Unit 4E- RN Case Manager See Treatment Team for direct phone # Weekend cross coverage  Patient Details  Name: Todd Gonzales MRN: [id number] Date of Birth: June 18, 1941  [facility]) CM/SW Contact:  Darrold Span, RN Phone Number: 04/16/2023, 11:32 AM   Clinical Narrative:    Pt stable for transition home today, HHPT orders placed,   CM in to see pt to discuss transition needs, Wife arrived to bedside as well. Pt agreeable to [facility] services- states he has had HH in past s/p orthopedic surgeries.  List provided for [facility] choice Per CMS guidelines from [employer] website with star ratings (copy placed in shadow chart)- pt and wife have selected [facility] as first choice ([facility] as backup).  Pt voiced he has all needed DME at home- no new DME needs at this time.  Wife to transport home.   Address, phone #s and PCP all confirmed in epic,   Pt has been started on Eliquis- 30 day free trail coupon provided to use on discharge to take to outpt pharmacy. Pt to f/u on his copay cost with his pharmacy.   Call made to [facility] for HHPT referral- referral has been accepted.   No further TOC needs noted.    Final next level of care: Home w Home Health Services Barriers to Discharge: No Barriers Identified   Patient Goals and CMS Choice CMS Medicare.gov Compare Post Acute Care list provided to:: Patient Choice offered to / list presented to : Patient  Discharge Placement                         Discharge Plan and Services Additional resources added to the After Visit Summary for     Discharge Planning Services: CM Consult Post Acute Care Choice: Home Health          DME Arranged: N/A DME Agency: NA       HH Arranged: PT HH Agency: [facility] Date HH Agency Contacted: 04/16/23 Time HH Agency Contacted: 1132 Representative spoke with at [facility] Agency:  Rivka Barbara  Social Determinants of Health (SDOH) Interventions SDOH Screenings   Food Insecurity: No Food Insecurity (04/14/2023)  Housing: Low Risk  (04/14/2023)  Transportation Needs: No Transportation Needs (04/14/2023)  Utilities: Not At Risk (04/14/2023)  Alcohol Screen: Low Risk  (08/05/2022)  Depression (PHQ2-9): Low Risk  (04/07/2023)  Financial Resource Strain: Low Risk  (08/05/2022)  Physical Activity: Insufficiently Active (08/05/2022)  Social Connections: Moderately Integrated (08/05/2022)  Stress: No Stress Concern Present (08/05/2022)  Tobacco Use: Low Risk  (04/14/2023)     Readmission Risk Interventions    04/16/2023   11:32 AM  Readmission Risk Prevention Plan  Post Dischage Appt Complete  Medication Screening Complete  Transportation Screening Complete

## 2023-04-16 NOTE — Discharge Summary (Signed)
[facility]  Physician Discharge Summary   Patient ID: Todd Gonzales MRN: [id number] DOB/AGE: [birth year] 84 y.o.  Admit date: 04/14/2023 Discharge date: 04/16/2023    PCP: Joaquim Nam, MD  DISCHARGE DIAGNOSES:    Atrial fibrillation with RVR (HCC)   HTN (hypertension)   BPH (benign prostatic hyperplasia)   Spinal stenosis   HLD (hyperlipidemia)   RECOMMENDATIONS FOR OUTPATIENT FOLLOW UP: Message sent to cardiology to reschedule his appointment to an earlier date if possible PCP to please check electrolytes and renal function in 1 week.    Home Health: PT Equipment/Devices: None  CODE STATUS: Full code  DISCHARGE CONDITION: fair  Diet recommendation: As before  INITIAL HISTORY: 82 y.o. male with medical history significant for hypertension, hyperlipidemia, spinal stenosis status post laminectomy, BPH presenting from PCP office with atrial fibrillation with RVR.   Consultants: None yet   Procedures: Echocardiogram     HOSPITAL COURSE:   Paroxysmal atrial fibrillation with RVR, new onset Patient was initially placed on diltiazem infusion and then changed to amiodarone infusion.  He was started on Eliquis. He converted to sinus rhythm.  He was transitioned to oral amiodarone.  Continued on Eliquis.  Echocardiogram was done which showed normal systolic function.  Thyroid function tests are normal. Patient maintaining sinus rhythm.  Symptoms have improved.  He was also given a dose of furosemide. Patient has an appointment with cardiology on July 1.  Message sent to try and move up the appointment to an earlier date.   Dyspnea Likely related to atrial fibrillation.  He was given a dose of furosemide due to mild edema.  EF however noted to be normal on echocardiogram.  No diastolic dysfunction noted.  Chest x-ray suggested only atelectasis.  Will be discharged on furosemide.  Outpatient follow-up with cardiology.  Electrolytes and renal function to be  monitored in the outpatient setting.   Essential hypertension Benazepril and HCTZ have been discontinued for now.  Started on furosemide as discussed above.   Hypokalemia Supplemented. magnesium 2.0.   Macrocytic anemia Anemia panel reviewed.  No clear deficiencies identified.   Mild thrombocytopenia Monitor closely.  No evidence of bleeding.   Hyperlipidemia Continue statin.   History of BPH Continue Flomax   History of spinal stenosis status postlaminectomy Stable.   Obesity Estimated body mass index is 36.33 kg/m as calculated from the following:   Height as of this encounter: 5\' 10"  (1.778 m).   Weight as of this encounter: 114.9 kg.    Patient is stable.  Okay for discharge home.  PERTINENT LABS:  The results of significant diagnostics from this hospitalization (including imaging, microbiology, ancillary and laboratory) are listed below for reference.    Microbiology: Recent Results (from the past 240 hour(s))  Urine Culture     Status: None   Collection Time: 04/07/23 12:57 PM   Specimen: Urine  Result Value Ref Range Status   MICRO NUMBER: [id number]  Final   SPECIMEN QUALITY: Adequate  Final   Sample Source URINE  Final   STATUS: FINAL  Final   Result: No Growth  Final  MICROSCOPIC MESSAGE     Status: None   Collection Time: 04/07/23 12:57 PM  Result Value Ref Range Status   Note   Final    Comment: This urine was analyzed for the presence of WBC,  RBC, bacteria, casts, and other formed elements.  Only those elements seen were reported. . .      Labs:   Basic Metabolic  Panel: Recent Labs  Lab 04/14/23 1420 04/14/23 1442 04/15/23 0220 04/16/23 0242  NA 140 140 141 140  K 4.1 4.1 3.3* 3.5  CL 103 104 102 101  CO2 27  --  31 29  GLUCOSE 112* 113* 131* 112*  BUN 22 27* 17 17  CREATININE 1.05 1.00 0.99 1.03  CALCIUM 8.4*  --  8.5* 8.4*  MG  --   --  2.1 2.0   Liver Function Tests: Recent Labs  Lab 04/14/23 1420 04/15/23 0220  AST 35  24  ALT 38 37  ALKPHOS 40 38  BILITOT 2.1* 1.5*  PROT 6.4* 6.0*  ALBUMIN 3.8 3.5    CBC: Recent Labs  Lab 04/14/23 1420 04/14/23 1442 04/15/23 0220 04/16/23 0242  WBC 8.8  --  8.7 8.3  NEUTROABS 6.8  --   --   --   HGB 14.0 14.6 12.9* 12.9*  HCT 44.6 43.0 41.1 39.8  MCV 104.2*  --  102.0* 101.5*  PLT 149*  --  141* 137*    BNP: BNP (last 3 results) Recent Labs    12/26/22 1638 04/14/23 1413  BNP 50 229.7*    IMAGING STUDIES ECHOCARDIOGRAM COMPLETE  Result Date: 04/15/2023    ECHOCARDIOGRAM REPORT   Patient Name:   Todd Gonzales Date of Exam: 04/15/2023 Medical Rec #:  [id number]     Height:       70.0 in Accession #:    [id number]    Weight:       253.2 lb Date of Birth:  05/14/1941     BSA:          2.307 m Patient Age:    82 years      BP:           133/69 mmHg Patient Gender: M             HR:           81 bpm. Exam Location:  Inpatient Procedure: 2D Echo, Color Doppler, Cardiac Doppler and Intracardiac            Opacification Agent Indications:    Afib  History:        Patient has no prior history of Echocardiogram examinations.                 Arrythmias:Atrial Fibrillation; Risk Factors:Hypertension and                 Dyslipidemia.  Sonographer:    Milbert Coulter Referring Phys: [id number] Cecille Po MELVIN  Sonographer Comments: Suboptimal parasternal window, suboptimal apical window, no subcostal window and patient is obese. Image acquisition challenging due to patient body habitus and Image acquisition challenging due to respiratory motion. IMPRESSIONS  1. Left ventricular ejection fraction, by estimation, is 60 to 65%. The left ventricle has normal function. The left ventricle has no regional wall motion abnormalities. There is mild left ventricular hypertrophy.  2. Right ventricular systolic function is normal. The right ventricular size is normal.  3. Trivial mitral valve regurgitation.  4. The aortic valve is tricuspid. Aortic valve regurgitation is not visualized. Aortic  valve sclerosis/calcification is present, without any evidence of aortic stenosis. FINDINGS  Left Ventricle: Left ventricular ejection fraction, by estimation, is 60 to 65%. The left ventricle has normal function. The left ventricle has no regional wall motion abnormalities. Definity contrast agent was given IV to delineate the left ventricular  endocardial borders. The left ventricular internal cavity size was normal in size.  There is mild left ventricular hypertrophy. Right Ventricle: The right ventricular size is normal. Right vetricular wall thickness was not assessed. Right ventricular systolic function is normal. Left Atrium: Left atrial size was normal in size. Right Atrium: Right atrial size was normal in size. Pericardium: There is no evidence of pericardial effusion. Mitral Valve: There is mild thickening of the mitral valve leaflet(s). Mild mitral annular calcification. Trivial mitral valve regurgitation. Tricuspid Valve: The tricuspid valve is normal in structure. Tricuspid valve regurgitation is trivial. Aortic Valve: The aortic valve is tricuspid. Aortic valve regurgitation is not visualized. Aortic valve sclerosis/calcification is present, without any evidence of aortic stenosis. Pulmonic Valve: The pulmonic valve was not well visualized. Pulmonic valve regurgitation is not visualized. No evidence of pulmonic stenosis. Aorta: The aortic root and ascending aorta are structurally normal, with no evidence of dilitation. IAS/Shunts: No atrial level shunt detected by color flow Doppler.  LEFT VENTRICLE PLAX 2D LVIDd:         4.80 cm      Diastology LVIDs:         3.40 cm      LV e' medial:    10.30 cm/s LV PW:         1.20 cm      LV E/e' medial:  10.1 LV IVS:        1.20 cm      LV e' lateral:   10.10 cm/s LVOT diam:     2.40 cm      LV E/e' lateral: 10.3 LVOT Area:     4.52 cm  LV Volumes (MOD) LV vol d, MOD A2C: 131.0 ml LV vol d, MOD A4C: 145.0 ml LV vol s, MOD A2C: 47.7 ml LV vol s, MOD A4C: 62.2 ml  LV SV MOD A2C:     83.3 ml LV SV MOD A4C:     145.0 ml LV SV MOD BP:      83.1 ml RIGHT VENTRICLE RV Basal diam:  3.20 cm RV Mid diam:    2.00 cm RV S prime:     11.40 cm/s TAPSE (M-mode): 2.0 cm LEFT ATRIUM             Index        RIGHT ATRIUM           Index LA diam:        3.30 cm 1.43 cm/m   RA Area:     13.60 cm LA Vol (A2C):   49.1 ml 21.28 ml/m  RA Volume:   32.30 ml  14.00 ml/m LA Vol (A4C):   29.6 ml 12.83 ml/m LA Biplane Vol: 38.8 ml 16.82 ml/m   AORTA Ao Root diam: 2.90 cm Ao Asc diam:  3.20 cm MITRAL VALVE MV Area (PHT): 4.60 cm     SHUNTS MV Decel Time: 165 msec     Systemic Diam: 2.40 cm MV E velocity: 104.00 cm/s MV A velocity: 99.60 cm/s MV E/A ratio:  1.04 Dietrich Pates MD Electronically signed by Dietrich Pates MD Signature Date/Time: 04/15/2023/2:52:17 PM    Final    DG Abd Portable 1V  Result Date: 04/15/2023 CLINICAL DATA:  Nausea. EXAM: PORTABLE ABDOMEN - 1 VIEW COMPARISON:  None Available. FINDINGS: Image quality is degraded secondary to suboptimal penetration. The bowel gas pattern is normal. No radio-opaque calculi or other significant radiographic abnormality are seen. Multilevel degenerative throughout the distal thoracic and lumbar spine. Partially visualized bilateral hip prostheses. IMPRESSION: Negative, given the limitations noted above. Electronically  Signed   By: Sherron Ales M.D.   On: 04/15/2023 13:31   DG Chest Port 1 View  Result Date: 04/14/2023 CLINICAL DATA:  Shortness of breath EXAM: PORTABLE CHEST 1 VIEW COMPARISON:  X-ray 12/26/2022 FINDINGS: Underinflation. Borderline cardiopericardial silhouette with vascular congestion and bronchovascular crowding. Minimal basilar atelectasis. No pneumothorax or edema. Overlapping cardiac leads. Film is under penetrated. IMPRESSION: Underinflation with bronchovascular crowding and atelectasis. Slight central vascular congestion but this could be technical. Please correlate with symptoms. If needed follow up x-ray with improved  inflation when clinically appropriate Electronically Signed   By: Karen Kays M.D.   On: 04/14/2023 15:09    DISCHARGE EXAMINATION: Vitals:   04/15/23 2200 04/15/23 2331 04/16/23 0546 04/16/23 1004  BP:  127/79 132/83 116/67  Pulse:  81 77   Resp:  18 16   Temp:  98 F (36.7 C) 98.3 F (36.8 C)   TempSrc:  Oral Oral   SpO2: 100% 93% 90%   Weight:      Height:       General appearance: Awake alert.  In no distress Resp: Clear to auscultation bilaterally.  Normal effort Cardio: S1-S2 is normal regular.  No S3-S4.  No rubs murmurs or bruit GI: Abdomen is soft.  Nontender nondistended.  Bowel sounds are present normal.  No masses organomegaly    DISPOSITION: Home  Discharge Instructions     Amb referral to AFIB Clinic   Complete by: As directed    Call MD for:  difficulty breathing, headache or visual disturbances   Complete by: As directed    Call MD for:  extreme fatigue   Complete by: As directed    Call MD for:  persistant dizziness or light-headedness   Complete by: As directed    Call MD for:  persistant nausea and vomiting   Complete by: As directed    Call MD for:  severe uncontrolled pain   Complete by: As directed    Call MD for:  temperature >100.4   Complete by: As directed    Diet - low sodium heart healthy   Complete by: As directed    Discharge instructions   Complete by: As directed    Take your medications as prescribed.  A message will be sent to the cardiologist office to see if they can move up your appointment.  In the meantime please be sure to follow-up with your primary care provider within 1 week.  Seek attention if your symptoms worsen.  You were cared for by a hospitalist during your hospital stay. If you have any questions about your discharge medications or the care you received while you were in the hospital after you are discharged, you can call the unit and asked to speak with the hospitalist on call if the hospitalist that took care of you  is not available. Once you are discharged, your primary care physician will handle any further medical issues. Please note that NO REFILLS for any discharge medications will be authorized once you are discharged, as it is imperative that you return to your primary care physician (or establish a relationship with a primary care physician if you do not have one) for your aftercare needs so that they can reassess your need for medications and monitor your lab values. If you do not have a primary care physician, you can call [phone] for a physician referral.   Increase activity slowly   Complete by: As directed  Allergies as of 04/16/2023       Reactions   Codeine Other (See Comments)   Unknown reaction   Lipitor [atorvastatin] Other (See Comments)   Arthralgias    Bactrim [sulfamethoxazole-trimethoprim] Rash        Medication List     STOP taking these medications    atorvastatin 10 MG tablet Commonly known as: LIPITOR   benazepril 10 MG tablet Commonly known as: LOTENSIN   celecoxib 200 MG capsule Commonly known as: CELEBREX   cetirizine 10 MG tablet Commonly known as: ZyrTEC Allergy   fluticasone 50 MCG/ACT nasal spray Commonly known as: FLONASE   hydrochlorothiazide 12.5 MG tablet Commonly known as: HYDRODIURIL       TAKE these medications    acetaminophen 650 MG CR tablet Commonly known as: TYLENOL Take 1,300 mg by mouth 2 (two) times daily as needed for pain.   amiodarone 200 MG tablet Commonly known as: PACERONE Take 1 tablet twice daily for 1 week and then one tablet once daily.   apixaban 5 MG Tabs tablet Commonly known as: ELIQUIS Take 1 tablet (5 mg total) by mouth 2 (two) times daily.   ciprofloxacin 500 MG tablet Commonly known as: CIPRO Take 500 mg by mouth 2 (two) times daily. 21 day course.   cyanocobalamin 1000 MCG/ML injection Commonly known as: VITAMIN B12 Inject 1 mL (1,000 mcg total) into the muscle every 30 (thirty) days.    diclofenac Sodium 1 % Gel Commonly known as: VOLTAREN Apply 1 Application topically every 6 (six) hours as needed (pain).   furosemide 40 MG tablet Commonly known as: LASIX Take 1 tablet (40 mg total) by mouth daily.   gabapentin 300 MG capsule Commonly known as: NEURONTIN TAKE ONE CAPSULE BY MOUTH ONCE DAILY What changed: when to take this   hydrocortisone 2.5 % rectal cream Commonly known as: Proctosol HC Place 1 application. rectally 2 (two) times daily. What changed:  when to take this reasons to take this   MAGNESIUM OXIDE PO Take 1 tablet by mouth daily.   OVER THE COUNTER MEDICATION Place 1-2 sprays into the nose daily as needed (allergies, nasal congestion). Unknown nasal spray.   polyethylene glycol 17 g packet Commonly known as: MIRALAX / GLYCOLAX Take 17 g by mouth daily as needed for mild constipation.   potassium chloride SA 20 MEQ tablet Commonly known as: KLOR-CON M Take 1 tablet (20 mEq total) by mouth daily.   SALONPAS EX Apply 1 patch topically daily as needed (pain).   tamsulosin 0.4 MG Caps capsule Commonly known as: FLOMAX TAKE TWO CAPSULE BY MOUTH DAILY What changed: See the new instructions.   VITAMIN D-3 PO Take 1 capsule by mouth daily.          Follow-up Information     Marykay Lex, MD Follow up.   Specialty: Cardiology Why: message will be sent to cardiology to see if they can move up the appointment Contact information: [address] [place] [address] [phone]         Joaquim Nam, MD. Schedule an appointment as soon as possible for a visit in 1 week(s).   Specialty: Family Medicine Why: post hospitalization follow up Contact information: [address] [place] [address] [phone]         [facility] Follow up.   Why: HHPT arranged- they will contact you to schedule-  [address] [place]riela Eves [place], [place] [address] Phone: [phone]  TOTAL DISCHARGE TIME: 35 mins  Haylei Cobin [employer] on [email or web address]  04/17/2023, 10:28 AM

## 2023-04-16 NOTE — Evaluation (Signed)
Occupational Therapy Evaluation Patient Details Name: Todd Gonzales MRN: [id number] DOB: Apr 19, 1941 Today's Date: 04/16/2023   History of Present Illness 82 yo male presenting to ED from PCP on 5/10 with a-fib with RVR. PMH including hypertension, hyperlipidemia, spinal stenosis status post laminectomy, BPH.   Clinical Impression   PTA, pt was living with his wife and was independent with ADLs using AE as needed. Pt currently performing ADLs and functional mobility at Mod I level with increased time and effort. Noting pt with increased work of breathing during activity. SpO2 95-88% on RA. Providing pt with education and handout on energy conservation for ADLs and IADLs; pt verbalized understanding and provided examples of ways he already implements techniques. Recommend dc to home once medically stable per physician. Providing all education and answering pt questions in preparation for dc later today.    Recommendations for follow up therapy are one component of a multi-disciplinary discharge planning process, led by the attending physician.  Recommendations may be updated based on patient status, additional functional criteria and insurance authorization.   Assistance Recommended at Discharge Intermittent Supervision/Assistance  Patient can return home with the following Assistance with cooking/housework    Functional Status Assessment  Patient has had a recent decline in their functional status and demonstrates the ability to make significant improvements in function in a reasonable and predictable amount of time.  Equipment Recommendations  None recommended by OT    Recommendations for Other Services       Precautions / Restrictions Precautions Precautions: Fall;Other (comment) Precaution Comments: increased effort and noting SOB      Mobility Bed Mobility               General bed mobility comments: OOB with PT upon arrival    Transfers Overall transfer level: Modified  independent                        Balance Overall balance assessment: No apparent balance deficits (not formally assessed)                                         ADL either performed or assessed with clinical judgement   ADL Overall ADL's : Modified independent                                       General ADL Comments: Pt performing ADLs at Mod I level with increased effort and time. Noting pt fatigues quickly. Proividing handout and education on compensatory techniques for ADLs/IADLs. Pt verbalized how he already sues some of the EC techniques.     Vision Baseline Vision/History: 1 Wears glasses Vision Assessment?: No apparent visual deficits     Perception     Praxis      Pertinent Vitals/Pain Pain Assessment Pain Assessment: No/denies pain     Hand Dominance Right   Extremity/Trunk Assessment Upper Extremity Assessment Upper Extremity Assessment: Generalized weakness   Lower Extremity Assessment Lower Extremity Assessment: Generalized weakness   Cervical / Trunk Assessment Cervical / Trunk Assessment: Kyphotic   Communication Communication Communication: No difficulties   Cognition Arousal/Alertness: Awake/alert Behavior During Therapy: WFL for tasks assessed/performed Overall Cognitive Status: Within Functional Limits for tasks assessed  General Comments  SpO2 with one episode of 81% on RA. However, upon monitoring, SpO2 88-95% on RA during activity for majority of session. Also, noted Vtach per monitor intermittently    Exercises     Shoulder Instructions      Home Living Family/patient expects to be discharged to:: Private residence Living Arrangements: Spouse/significant other Available Help at Discharge: Family;Available 24 hours/day Type of Home: House Home Access: Level entry     Home Layout: Two level;Able to live on main level with  bedroom/bathroom     Bathroom Shower/Tub: Arts development officer Toilet: Handicapped height     Home Equipment: Agricultural consultant (2 wheels);Cane - single point;Hospital bed;Adaptive equipment;Hand held shower head;Shower seat;Grab bars - Probation officer: Reacher;Sock aid;Long-handled sponge        Prior Functioning/Environment Prior Level of Function : Independent/Modified Independent             Mobility Comments: independent wtih all mobility, drives, denies any falls ADLs Comments: ADLs, IADLs, and driving.        OT Problem List: Decreased activity tolerance;Decreased knowledge of use of DME or AE;Decreased knowledge of precautions;Decreased strength      OT Treatment/Interventions:      OT Goals(Current goals can be found in the care plan section) Acute Rehab OT Goals Patient Stated Goal: Go home today OT Goal Formulation: All assessment and education complete, DC therapy  OT Frequency:      Co-evaluation PT/OT/SLP Co-Evaluation/Treatment: Yes Reason for Co-Treatment: For patient/therapist safety;To address functional/ADL transfers (Overlap to check SpO2)   OT goals addressed during session: ADL's and self-care      AM-PAC OT "6 Clicks" Daily Activity     Outcome Measure Help from another person eating meals?: None Help from another person taking care of personal grooming?: None Help from another person toileting, which includes using toliet, bedpan, or urinal?: None Help from another person bathing (including washing, rinsing, drying)?: None Help from another person to put on and taking off regular upper body clothing?: None Help from another person to put on and taking off regular lower body clothing?: None 6 Click Score: 24   End of Session Equipment Utilized During Treatment: Gait belt Nurse Communication: Mobility status  Activity Tolerance: Patient tolerated treatment well Patient left: in chair;with call bell/phone within  reach  OT Visit Diagnosis: Unsteadiness on feet (R26.81);Other abnormalities of gait and mobility (R26.89);Muscle weakness (generalized) (M62.81)                Time: 1025-1050 OT Time Calculation (min): 25 min Charges:  OT General Charges $OT Visit: 1 Visit OT Evaluation $OT Eval Low Complexity: 1 Low  Todd Gonzales MSOT, OTR/L Acute Rehab Office: [phone]  Theodoro Grist Eleyna Brugh 04/16/2023, 12:06 PM

## 2023-04-16 NOTE — Evaluation (Signed)
Physical Therapy Evaluation Patient Details Name: Todd Gonzales MRN: [id number] DOB: 10/23/1941 Today's Date: 04/16/2023  History of Present Illness  82 yo male presenting to ED from PCP on 5/10 with a-fib with RVR. PMH including hypertension, hyperlipidemia, spinal stenosis status post laminectomy, BPH.  Clinical Impression  Pt presents today close to his mobility baseline but does have deficits in endurance and activity tolerance, as well as balance. Pt reports ambulating independently at baseline, utilizing hallway rail for all mobility today, declining use of RW but no overt LOB noted. Pt noted to fatigue with hallway ambulation, desatting to mid 80s with first trial but noting questionable pleth, 88-mid 90s with second ambulation trial, pt recovering back to 90s with a seated rest break and noted improved RR and WOB with rest. Acute PT will follow up with pt and recommend HHPT upon discharge to progress pt's endurance and mobility.      Recommendations for follow up therapy are one component of a multi-disciplinary discharge planning process, led by the attending physician.  Recommendations may be updated based on patient status, additional functional criteria and insurance authorization.  Follow Up Recommendations       Assistance Recommended at Discharge Intermittent Supervision/Assistance  Patient can return home with the following  A little help with walking and/or transfers;Help with stairs or ramp for entrance    Equipment Recommendations None recommended by PT  Recommendations for Other Services       Functional Status Assessment Patient has had a recent decline in their functional status and demonstrates the ability to make significant improvements in function in a reasonable and predictable amount of time.     Precautions / Restrictions Precautions Precautions: Fall;Other (comment) Precaution Comments: watch O2 and SOB Restrictions Weight Bearing Restrictions: No       Mobility  Bed Mobility               General bed mobility comments: pt in chair upon arrival and ended session in chair    Transfers Overall transfer level: Modified independent Equipment used: None               General transfer comment: mildly increased time to complete but no assist required    Ambulation/Gait Ambulation/Gait assistance: Min guard, Supervision Gait Distance (Feet): 75 Feet (x2 trials) Assistive device: None Gait Pattern/deviations: Step-through pattern, Decreased stride length, Trunk flexed, Drifts right/left, Antalgic, Trendelenburg Gait velocity: decreased     General Gait Details: pt declining use of RW or HHA, preferring to utilize the rails in the hallways, mild lateral sway and ambulating intermittently without UE support. cued for forward gaze as pt ambulates with increased trunk flexion. Noted to desat to ~88% and increased RR but resolves and recovers with a seated rest break on room air  Stairs            Wheelchair Mobility    Modified Rankin (Stroke Patients Only)       Balance Overall balance assessment: Needs assistance Sitting-balance support: No upper extremity supported, Feet supported Sitting balance-Leahy Scale: Good     Standing balance support: Single extremity supported, No upper extremity supported, During functional activity Standing balance-Leahy Scale: Fair Standing balance comment: intermittent use of UE for balance on rails with ambulation                             Pertinent Vitals/Pain Pain Assessment Pain Assessment: No/denies pain    Home Living Family/patient expects  to be discharged to:: Private residence Living Arrangements: Spouse/significant other Available Help at Discharge: Family;Available 24 hours/day Type of Home: House Home Access: Level entry       Home Layout: Two level;Able to live on main level with bedroom/bathroom Home Equipment: Rolling Walker (2  wheels);Cane - single point;Hospital bed;Adaptive equipment;Hand held shower head;Shower seat;Grab bars - tub/shower      Prior Function Prior Level of Function : Independent/Modified Independent             Mobility Comments: independent wtih all mobility, drives, denies any falls ADLs Comments: ADLs, IADLs, and driving.     Hand Dominance   Dominant Hand: Right    Extremity/Trunk Assessment   Upper Extremity Assessment Upper Extremity Assessment: Defer to OT evaluation    Lower Extremity Assessment Lower Extremity Assessment: Generalized weakness    Cervical / Trunk Assessment Cervical / Trunk Assessment: Kyphotic  Communication   Communication: No difficulties  Cognition Arousal/Alertness: Awake/alert Behavior During Therapy: WFL for tasks assessed/performed Overall Cognitive Status: Within Functional Limits for tasks assessed                                 General Comments: A&Ox4        General Comments General comments (skin integrity, edema, etc.): 88-95% SPO2 on room air, questionable desat initially with first ambulation trial to low 80s. Recovers both trials with seated rest breaks    Exercises     Assessment/Plan    PT Assessment Patient needs continued PT services  PT Problem List Decreased activity tolerance;Decreased balance;Decreased mobility;Decreased knowledge of use of DME;Decreased knowledge of precautions;Cardiopulmonary status limiting activity       PT Treatment Interventions DME instruction;Gait training;Functional mobility training;Therapeutic activities;Therapeutic exercise;Balance training;Neuromuscular re-education;Patient/family education    PT Goals (Current goals can be found in the Care Plan section)  Acute Rehab PT Goals Patient Stated Goal: go home PT Goal Formulation: With patient Time For Goal Achievement: 04/30/23 Potential to Achieve Goals: Good    Frequency Min 3X/week     Co-evaluation PT/OT/SLP  Co-Evaluation/Treatment: Yes Reason for Co-Treatment: For patient/therapist safety;To address functional/ADL transfers PT goals addressed during session: Mobility/safety with mobility OT goals addressed during session: ADL's and self-care       AM-PAC PT "6 Clicks" Mobility  Outcome Measure Help needed turning from your back to your side while in a flat bed without using bedrails?: None Help needed moving from lying on your back to sitting on the side of a flat bed without using bedrails?: None Help needed moving to and from a bed to a chair (including a wheelchair)?: None Help needed standing up from a chair using your arms (e.g., wheelchair or bedside chair)?: None Help needed to walk in hospital room?: A Little Help needed climbing 3-5 steps with a railing? : A Little 6 Click Score: 22    End of Session Equipment Utilized During Treatment: Gait belt Activity Tolerance: Patient tolerated treatment well Patient left: in chair;with call bell/phone within reach Nurse Communication: Mobility status PT Visit Diagnosis: Difficulty in walking, not elsewhere classified (R26.2)    Time: 2951-8841 PT Time Calculation (min) (ACUTE ONLY): 24 min   Charges:   PT Evaluation $PT Eval Low Complexity: 1 Low          Lindalou Hose, PT DPT Acute Rehabilitation Services Office [phone]   Leonie Man 04/16/2023, 1:40 PM

## 2023-04-17 ENCOUNTER — Encounter: Payer: Self-pay | Admitting: Family Medicine

## 2023-04-18 ENCOUNTER — Telehealth: Payer: Self-pay

## 2023-04-18 NOTE — Transitions of Care (Post Inpatient/ED Visit) (Signed)
   04/18/2023  Name: Todd Gonzales MRN: [id number] DOB: Apr 04, 1941  Today's TOC FU Call Status: Today's TOC FU Call Status:: Unsuccessul Call (1st Attempt) Unsuccessful Call (1st Attempt) Date: 04/18/23  Attempted to reach the patient regarding the most recent Inpatient/ED visit.  Follow Up Plan: Additional outreach attempts will be made to reach the patient to complete the Transitions of Care (Post Inpatient/ED visit) call.   Signature Agnes Lawrence, CMA (AAMA)  [facility] [phone]

## 2023-04-19 DIAGNOSIS — K649 Unspecified hemorrhoids: Secondary | ICD-10-CM | POA: Diagnosis not present

## 2023-04-19 DIAGNOSIS — C44311 Basal cell carcinoma of skin of nose: Secondary | ICD-10-CM | POA: Diagnosis not present

## 2023-04-19 DIAGNOSIS — Z7901 Long term (current) use of anticoagulants: Secondary | ICD-10-CM | POA: Diagnosis not present

## 2023-04-19 DIAGNOSIS — I10 Essential (primary) hypertension: Secondary | ICD-10-CM | POA: Diagnosis not present

## 2023-04-19 DIAGNOSIS — M48061 Spinal stenosis, lumbar region without neurogenic claudication: Secondary | ICD-10-CM | POA: Diagnosis not present

## 2023-04-19 DIAGNOSIS — Z96642 Presence of left artificial hip joint: Secondary | ICD-10-CM | POA: Diagnosis not present

## 2023-04-19 DIAGNOSIS — Z9181 History of falling: Secondary | ICD-10-CM | POA: Diagnosis not present

## 2023-04-19 DIAGNOSIS — M1990 Unspecified osteoarthritis, unspecified site: Secondary | ICD-10-CM | POA: Diagnosis not present

## 2023-04-19 DIAGNOSIS — E785 Hyperlipidemia, unspecified: Secondary | ICD-10-CM | POA: Diagnosis not present

## 2023-04-19 DIAGNOSIS — I4891 Unspecified atrial fibrillation: Secondary | ICD-10-CM | POA: Diagnosis not present

## 2023-04-19 DIAGNOSIS — Z556 Problems related to health literacy: Secondary | ICD-10-CM | POA: Diagnosis not present

## 2023-04-19 DIAGNOSIS — N400 Enlarged prostate without lower urinary tract symptoms: Secondary | ICD-10-CM | POA: Diagnosis not present

## 2023-04-20 ENCOUNTER — Encounter: Payer: Self-pay | Admitting: Family Medicine

## 2023-04-20 ENCOUNTER — Ambulatory Visit (INDEPENDENT_AMBULATORY_CARE_PROVIDER_SITE_OTHER): Payer: Medicare Other | Admitting: Family Medicine

## 2023-04-20 VITALS — BP 120/64 | HR 72 | Temp 97.6°F | Ht 70.0 in | Wt 243.0 lb

## 2023-04-20 DIAGNOSIS — I4891 Unspecified atrial fibrillation: Secondary | ICD-10-CM | POA: Diagnosis not present

## 2023-04-20 LAB — BASIC METABOLIC PANEL
BUN: 22 mg/dL (ref 6–23)
CO2: 33 mEq/L — ABNORMAL HIGH (ref 19–32)
Calcium: 8.9 mg/dL (ref 8.4–10.5)
Chloride: 102 mEq/L (ref 96–112)
Creatinine, Ser: 1.05 mg/dL (ref 0.40–1.50)
GFR: 65.3 mL/min (ref >60.00)
Glucose, Bld: 96 mg/dL (ref 70–99)
Potassium: 3.8 mEq/L (ref 3.5–5.1)
Sodium: 144 mEq/L (ref 135–145)

## 2023-04-20 MED ORDER — TRAMADOL HCL 50 MG PO TABS: 25.0000 mg | ORAL_TABLET | Freq: Three times a day (TID) | ORAL | 1 refills | Status: DC | PRN

## 2023-04-20 NOTE — Transitions of Care (Post Inpatient/ED Visit) (Signed)
   04/20/2023  Name: Todd Gonzales MRN: [id number] DOB: 1941-08-22  Today's TOC FU Call Status: Today's TOC FU Call Status:: Unsuccessful Call (2nd Attempt) Unsuccessful Call (1st Attempt) Date: 04/18/23 Unsuccessful Call (2nd Attempt) Date: 04/20/23  Attempted to reach the patient regarding the most recent Inpatient/ED visit.  Follow Up Plan: Additional outreach attempts will be made to reach the patient to complete the Transitions of Care (Post Inpatient/ED visit) call.   Signature Agnes Lawrence, CMA (AAMA)  [facility] [phone]

## 2023-04-20 NOTE — Progress Notes (Signed)
Admit date: 04/14/2023 Discharge date: 04/16/2023     PCP: Joaquim Nam, MD   DISCHARGE DIAGNOSES:    Atrial fibrillation with RVR (HCC)   HTN (hypertension)   BPH (benign prostatic hyperplasia)   Spinal stenosis   HLD (hyperlipidemia)     RECOMMENDATIONS FOR OUTPATIENT FOLLOW UP: Message sent to cardiology to reschedule his appointment to an earlier date if possible PCP to please check electrolytes and renal function in 1 week.       Home Health: PT Equipment/Devices: None   CODE STATUS: Full code   DISCHARGE CONDITION: fair   Diet recommendation: As before   INITIAL HISTORY: 82 y.o. male with medical history significant for hypertension, hyperlipidemia, spinal stenosis status post laminectomy, BPH presenting from PCP office with atrial fibrillation with RVR.   Consultants: None yet   Procedures: Echocardiogram        HOSPITAL COURSE:    Paroxysmal atrial fibrillation with RVR, new onset Patient was initially placed on diltiazem infusion and then changed to amiodarone infusion.  He was started on Eliquis. He converted to sinus rhythm.  He was transitioned to oral amiodarone.  Continued on Eliquis.  Echocardiogram was done which showed normal systolic function.  Thyroid function tests are normal. Patient maintaining sinus rhythm.  Symptoms have improved.  He was also given a dose of furosemide. Patient has an appointment with cardiology on July 1.  Message sent to try and move up the appointment to an earlier date.   Dyspnea Likely related to atrial fibrillation.  He was given a dose of furosemide due to mild edema.  EF however noted to be normal on echocardiogram.  No diastolic dysfunction noted.  Chest x-ray suggested only atelectasis.  Will be discharged on furosemide.  Outpatient follow-up with cardiology.  Electrolytes and renal function to be monitored in the outpatient setting.   Essential hypertension Benazepril and HCTZ have been discontinued for now.   Started on furosemide as discussed above.   Hypokalemia Supplemented. magnesium 2.0.   Macrocytic anemia Anemia panel reviewed.  No clear deficiencies identified.   Mild thrombocytopenia Monitor closely.  No evidence of bleeding.   Hyperlipidemia Continue statin.   History of BPH Continue Flomax   History of spinal stenosis status postlaminectomy Stable.   Obesity Estimated body mass index is 36.33 kg/m as calculated from the following:   Height as of this encounter: 5\' 10"  (1.778 m).   Weight as of this encounter: 114.9 kg.         TAKE these medications     acetaminophen 650 MG CR tablet Commonly known as: TYLENOL Take 1,300 mg by mouth 2 (two) times daily as needed for pain.    amiodarone 200 MG tablet Commonly known as: PACERONE Take 1 tablet twice daily for 1 week and then one tablet once daily.    apixaban 5 MG Tabs tablet Commonly known as: ELIQUIS Take 1 tablet (5 mg total) by mouth 2 (two) times daily.    ciprofloxacin 500 MG tablet Commonly known as: CIPRO Take 500 mg by mouth 2 (two) times daily. 21 day course.    cyanocobalamin 1000 MCG/ML injection Commonly known as: VITAMIN B12 Inject 1 mL (1,000 mcg total) into the muscle every 30 (thirty) days.    diclofenac Sodium 1 % Gel Commonly known as: VOLTAREN Apply 1 Application topically every 6 (six) hours as needed (pain).    furosemide 40 MG tablet Commonly known as: LASIX Take 1 tablet (40 mg total) by mouth daily.  gabapentin 300 MG capsule Commonly known as: NEURONTIN TAKE ONE CAPSULE BY MOUTH ONCE DAILY What changed: when to take this    hydrocortisone 2.5 % rectal cream Commonly known as: Proctosol HC Place 1 application. rectally 2 (two) times daily. What changed:  when to take this reasons to take this    MAGNESIUM OXIDE PO Take 1 tablet by mouth daily.    OVER THE COUNTER MEDICATION Place 1-2 sprays into the nose daily as needed (allergies, nasal congestion). Unknown  nasal spray.    polyethylene glycol 17 g packet Commonly known as: MIRALAX / GLYCOLAX Take 17 g by mouth daily as needed for mild constipation.    potassium chloride SA 20 MEQ tablet Commonly known as: KLOR-CON M Take 1 tablet (20 mEq total) by mouth daily.    SALONPAS EX Apply 1 patch topically daily as needed (pain).    tamsulosin 0.4 MG Caps capsule Commonly known as: FLOMAX TAKE TWO CAPSULE BY MOUTH DAILY What changed: See the new instructions.    VITAMIN D-3 PO Take 1 capsule by mouth daily.      ================================== See above, inpatient course discussed with patient.  A-fib pathophysiology discussed with patient.  Previously with tachycardia and dyspnea.  Both improved in the meantime.  Benazepril and HCTZ have been discontinued for now.  Started on furosemide  He clearly feels better than upon admission.   No CP.  SOB is clearly better.  Not lightheaded.  No FCVD.  Some occ nausea.  No dysuria.  No sensation of skipped or fast heart beats.    We talked about arthritis treatment, ie knee pain.  He is anticoagulated but that should be okay for the injection.   Meds, vitals, and allergies reviewed.   ROS: Per HPI unless specifically indicated in ROS section   Nad Ncat Neck supple, no LA Rrr Ctab Abd soft, not ttp Skin well-perfused. Extremities without edema.  30 minutes were devoted to patient care in this encounter (this includes time spent reviewing the patient's file/history, interviewing and examining the patient, counseling/reviewing plan with patient).

## 2023-04-20 NOTE — Patient Instructions (Addendum)
Let me check with Dr. Herbie Baltimore.  Go to the lab on the way out.   If you have mychart we'll likely use that to update you.    Take care.  Glad to see you. Try tramadol with tylenol if needed for pain.  Sedation caution.

## 2023-04-21 ENCOUNTER — Other Ambulatory Visit: Payer: Self-pay | Admitting: Family Medicine

## 2023-04-21 ENCOUNTER — Encounter: Payer: Self-pay | Admitting: Family Medicine

## 2023-04-21 MED ORDER — ONDANSETRON HCL 4 MG PO TABS: 4.0000 mg | ORAL_TABLET | Freq: Three times a day (TID) | ORAL | 0 refills | Status: DC | PRN

## 2023-04-23 NOTE — Assessment & Plan Note (Addendum)
History of, sounds to be back in normal sinus rhythm at this point.  Benazepril and hydrochlorothiazide have been discontinued.  Blood pressure is reasonable.  Taking furosemide in the meantime.  See notes on labs.  No chest pain.  Shortness of breath is clearly better.  I am going to check to see if his cardiology office visit can be moved sooner.  Okay for outpatient follow-up.  Continue anticoagulation with Eliquis.  Continue amiodarone as is for now.

## 2023-04-24 ENCOUNTER — Other Ambulatory Visit: Payer: Medicare Other

## 2023-04-25 DIAGNOSIS — M48061 Spinal stenosis, lumbar region without neurogenic claudication: Secondary | ICD-10-CM | POA: Diagnosis not present

## 2023-04-25 DIAGNOSIS — Z7901 Long term (current) use of anticoagulants: Secondary | ICD-10-CM | POA: Diagnosis not present

## 2023-04-25 DIAGNOSIS — Z556 Problems related to health literacy: Secondary | ICD-10-CM | POA: Diagnosis not present

## 2023-04-25 DIAGNOSIS — E785 Hyperlipidemia, unspecified: Secondary | ICD-10-CM | POA: Diagnosis not present

## 2023-04-25 DIAGNOSIS — Z96642 Presence of left artificial hip joint: Secondary | ICD-10-CM | POA: Diagnosis not present

## 2023-04-25 DIAGNOSIS — N400 Enlarged prostate without lower urinary tract symptoms: Secondary | ICD-10-CM | POA: Diagnosis not present

## 2023-04-25 DIAGNOSIS — C44311 Basal cell carcinoma of skin of nose: Secondary | ICD-10-CM | POA: Diagnosis not present

## 2023-04-25 DIAGNOSIS — M1990 Unspecified osteoarthritis, unspecified site: Secondary | ICD-10-CM | POA: Diagnosis not present

## 2023-04-25 DIAGNOSIS — I4891 Unspecified atrial fibrillation: Secondary | ICD-10-CM | POA: Diagnosis not present

## 2023-04-25 DIAGNOSIS — I10 Essential (primary) hypertension: Secondary | ICD-10-CM | POA: Diagnosis not present

## 2023-04-25 DIAGNOSIS — K649 Unspecified hemorrhoids: Secondary | ICD-10-CM | POA: Diagnosis not present

## 2023-04-25 DIAGNOSIS — Z9181 History of falling: Secondary | ICD-10-CM | POA: Diagnosis not present

## 2023-04-25 NOTE — Progress Notes (Unsigned)
    Todd Kubitz T. Adelynne Joerger, MD, CAQ Sports Medicine [facility] [address] [place], [address]  Phone: [phone]  FAX: [phone]  Todd Gonzales - 82 y.o. male  MRN [id number]  Date of Birth: 1941/11/01  Date: 04/26/2023  PCP: Joaquim Nam, MD  Referral: Joaquim Nam, MD  No chief complaint on file.  Subjective:   Todd Gonzales is a 82 y.o. very pleasant male patient with There is no height or weight on file to calculate BMI. who presents with the following:  Patient presents for bilateral knee osteoarthritis.    Review of Systems is noted in the HPI, as appropriate  Objective:   There were no vitals taken for this visit.  GEN: No acute distress; alert,appropriate. PULM: Breathing comfortably in no respiratory distress PSYCH: Normally interactive.   Laboratory and Imaging Data:  Assessment and Plan:   ***

## 2023-04-26 ENCOUNTER — Ambulatory Visit (INDEPENDENT_AMBULATORY_CARE_PROVIDER_SITE_OTHER): Payer: Medicare Other | Admitting: Family Medicine

## 2023-04-26 ENCOUNTER — Encounter: Payer: Self-pay | Admitting: Family Medicine

## 2023-04-26 VITALS — BP 150/80 | HR 71 | Temp 97.8°F | Ht 70.0 in | Wt 244.0 lb

## 2023-04-26 DIAGNOSIS — M170 Bilateral primary osteoarthritis of knee: Secondary | ICD-10-CM | POA: Diagnosis not present

## 2023-04-26 MED ORDER — TRIAMCINOLONE ACETONIDE 40 MG/ML IJ SUSP
40.0000 mg | Freq: Once | INTRAMUSCULAR | Status: AC
Start: 2023-04-26 — End: 2023-04-26
  Administered 2023-04-26: 40 mg via INTRA_ARTICULAR

## 2023-04-26 NOTE — Addendum Note (Signed)
Addended by: Damita Lack on: 04/26/2023 11:18 AM   Modules accepted: Orders

## 2023-04-26 NOTE — Transitions of Care (Post Inpatient/ED Visit) (Signed)
   04/26/2023  Name: Todd Gonzales MRN: [id number] DOB: [birth year]  Today's TOC FU Call Status: Today's TOC FU Call Status:: Unsuccessful Call (2nd Attempt) Unsuccessful Call (1st Attempt) Date: 04/18/23 Unsuccessful Call (2nd Attempt) Date: 04/20/23  Attempted to reach the patient regarding the most recent Inpatient/ED visit.  Follow Up Plan: No further outreach attempts will be made at this time. We have been unable to contact the patient. Patient has been seen by PCP, no further attempts will be made.  Signature  Agnes Lawrence, CMA (AAMA)  [facility] [phone]

## 2023-05-02 ENCOUNTER — Ambulatory Visit (INDEPENDENT_AMBULATORY_CARE_PROVIDER_SITE_OTHER): Payer: Medicare Other

## 2023-05-02 DIAGNOSIS — M48061 Spinal stenosis, lumbar region without neurogenic claudication: Secondary | ICD-10-CM | POA: Diagnosis not present

## 2023-05-02 DIAGNOSIS — E785 Hyperlipidemia, unspecified: Secondary | ICD-10-CM | POA: Diagnosis not present

## 2023-05-02 DIAGNOSIS — M1990 Unspecified osteoarthritis, unspecified site: Secondary | ICD-10-CM | POA: Diagnosis not present

## 2023-05-02 DIAGNOSIS — N400 Enlarged prostate without lower urinary tract symptoms: Secondary | ICD-10-CM | POA: Diagnosis not present

## 2023-05-02 DIAGNOSIS — E538 Deficiency of other specified B group vitamins: Secondary | ICD-10-CM | POA: Diagnosis not present

## 2023-05-02 DIAGNOSIS — C44311 Basal cell carcinoma of skin of nose: Secondary | ICD-10-CM | POA: Diagnosis not present

## 2023-05-02 DIAGNOSIS — I10 Essential (primary) hypertension: Secondary | ICD-10-CM | POA: Diagnosis not present

## 2023-05-02 DIAGNOSIS — Z9181 History of falling: Secondary | ICD-10-CM | POA: Diagnosis not present

## 2023-05-02 DIAGNOSIS — Z96642 Presence of left artificial hip joint: Secondary | ICD-10-CM | POA: Diagnosis not present

## 2023-05-02 DIAGNOSIS — K649 Unspecified hemorrhoids: Secondary | ICD-10-CM | POA: Diagnosis not present

## 2023-05-02 DIAGNOSIS — I4891 Unspecified atrial fibrillation: Secondary | ICD-10-CM | POA: Diagnosis not present

## 2023-05-02 DIAGNOSIS — Z556 Problems related to health literacy: Secondary | ICD-10-CM | POA: Diagnosis not present

## 2023-05-02 DIAGNOSIS — Z7901 Long term (current) use of anticoagulants: Secondary | ICD-10-CM | POA: Diagnosis not present

## 2023-05-02 MED ORDER — CYANOCOBALAMIN 1000 MCG/ML IJ SOLN
1000.0000 ug | Freq: Once | INTRAMUSCULAR | Status: AC
Start: 2023-05-02 — End: 2023-05-02
  Administered 2023-05-02: 1000 ug via INTRAMUSCULAR

## 2023-05-02 NOTE — Progress Notes (Signed)
Per orders of Dr. Crawford Givens, injection of Vitamin B12 given by Gabriel Rainwater in left deltoid. Patient tolerated injection well. Patient will make appointment for 1 month.

## 2023-05-03 ENCOUNTER — Ambulatory Visit: Payer: Medicare Other | Admitting: Dermatology

## 2023-05-03 VITALS — BP 153/89 | HR 64

## 2023-05-03 DIAGNOSIS — L570 Actinic keratosis: Secondary | ICD-10-CM | POA: Diagnosis not present

## 2023-05-03 DIAGNOSIS — L820 Inflamed seborrheic keratosis: Secondary | ICD-10-CM

## 2023-05-03 DIAGNOSIS — C44329 Squamous cell carcinoma of skin of other parts of face: Secondary | ICD-10-CM

## 2023-05-03 DIAGNOSIS — X32XXXA Exposure to sunlight, initial encounter: Secondary | ICD-10-CM

## 2023-05-03 DIAGNOSIS — W908XXA Exposure to other nonionizing radiation, initial encounter: Secondary | ICD-10-CM

## 2023-05-03 DIAGNOSIS — C44311 Basal cell carcinoma of skin of nose: Secondary | ICD-10-CM | POA: Diagnosis not present

## 2023-05-03 DIAGNOSIS — L649 Androgenic alopecia, unspecified: Secondary | ICD-10-CM

## 2023-05-03 DIAGNOSIS — L578 Other skin changes due to chronic exposure to nonionizing radiation: Secondary | ICD-10-CM

## 2023-05-03 DIAGNOSIS — C4492 Squamous cell carcinoma of skin, unspecified: Secondary | ICD-10-CM

## 2023-05-03 DIAGNOSIS — Z872 Personal history of diseases of the skin and subcutaneous tissue: Secondary | ICD-10-CM

## 2023-05-03 MED ORDER — MOMETASONE FUROATE 0.1 % EX CREA: TOPICAL_CREAM | CUTANEOUS | 1 refills | Status: DC

## 2023-05-03 NOTE — Progress Notes (Signed)
Follow-Up Visit   Subjective  Todd Gonzales is a 82 y.o. male who presents for the following: Hair loss of the scalp. He is using a medicated shampoo and rinse, unsure of name. He also has a few spots on his legs to check today. 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 Patient states he put Mohs surgery on hold due to recent bout with Atrial fibrillation.  The following portions of the chart were reviewed this encounter and updated as appropriate: medications, allergies, medical history  Review of Systems:  No other skin or systemic complaints except as noted in HPI or Assessment and Plan.  Objective  Well appearing patient in no apparent distress; mood and affect are within normal limits.  A focused examination was performed of the following areas: Face, scalp  Relevant physical exam findings are noted in the Assessment and Plan.  Left medial cheek above nasolabial Pink biopsy site.  Right paranasal Pink biopsy site.  lower legs Well-demarcated pink/brown scaly macules and patches of the lower legs.    Assessment & Plan   ACTINIC DAMAGE WITH PRECANCEROUS ACTINIC KERATOSES Counseling for Topical Chemotherapy Management: Patient exhibits: - Severe, confluent actinic changes with pre-cancerous actinic keratoses that is secondary to cumulative UV radiation exposure over time - Condition that is severe; chronic, not at goal. - diffuse scaly erythematous macules and papules with underlying dyspigmentation - Discussed Prescription "Field Treatment" topical Chemotherapy for Severe, Chronic Confluent Actinic Changes with Pre-Cancerous Actinic Keratoses Field treatment involves treatment of an entire area of skin that has confluent Actinic Changes (Sun/ Ultraviolet light damage) and PreCancerous Actinic Keratoses by method of PhotoDynamic Therapy (PDT) and/or prescription Topical Chemotherapy agents such as  5-fluorouracil, 5-fluorouracil/calcipotriene, and/or imiquimod.  The purpose is to decrease the number of clinically evident and subclinical PreCancerous lesions to prevent progression to development of skin cancer by chemically destroying early precancer changes that may or may not be visible.  It has been shown to reduce the risk of developing skin cancer in the treated area. As a result of treatment, redness, scaling, crusting, and open sores may occur during treatment course. One or more than one of these methods may be used and may have to be used several times to control, suppress and eliminate the PreCancerous changes. Discussed treatment course, expected reaction, and possible side effects. - Recommend daily broad spectrum sunscreen SPF 30+ to sun-exposed areas, reapply every 2 hours as needed.  - Staying in the shade or wearing long sleeves, sun glasses (UVA+UVB protection) and wide brim hats (4-inch brim around the entire circumference of the hat) are also recommended. - Call for new or changing lesions. - Pt had Red light PDT treatment 02/06/2023. - Recommend 2nd Red light PDT treatment with debridement this fall.  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 Discussed EDC to areas instead. Patient wants to wait to decide on how and when to treat, and recheck areas on follow-up instead. Discussed that the cancer will likely recur.  Basal cell carcinoma (BCC) of skin of nose Right paranasal  Patient put Mohs surgery on hold due to recent Atrial fibrillation. Discussed EDC to areas instead. Patient wants to wait to decide on how and when to treat, and recheck areas on follow-up instead.  Discussed that the cancer will likely recur.  Inflamed seborrheic keratosis lower legs  vs Psoriasis.   Start mometasone cream Spot treat affected areas on lower legs QD/BID dsp 45g 1Rf Topical steroids (such as  triamcinolone, fluocinolone, fluocinonide, mometasone, clobetasol, halobetasol, betamethasone, hydrocortisone) can cause thinning and lightening of the skin if they are used for too long in the same area. Your physician has selected the right strength medicine for your problem and area affected on the body. Please use your medication only as directed by your physician to prevent side effects.   Recommend starting moisturizer with exfoliant (Urea, Salicylic acid, or Lactic acid) one to two times daily to help smooth rough and bumpy skin.  OTC options include Cetaphil Rough and Bumpy lotion (Urea), Eucerin Roughness Relief lotion or spot treatment cream (Urea), CeraVe SA lotion/cream for Rough and Bumpy skin (Sal Acid), Gold Bond Rough and Bumpy cream (Sal Acid), and AmLactin 12% lotion/cream (Lactic Acid).  If applying in morning, also apply sunscreen to sun-exposed areas, since these exfoliating moisturizers can increase sensitivity to sun.   mometasone (ELOCON) 0.1 % cream - lower legs Spot treat thicker areas on lower legs once to twice daily until improved. Avoid face, groin, axilla.  Androgenic alopecia  HISTORY OF PRECANCEROUS ACTINIC KERATOSIS of the glabella, biopsy proven - clear - site(s) of PreCancerous Actinic Keratosis clear today. - these may recur and new lesions may form requiring treatment to prevent transformation into skin cancer - observe for new or changing spots and contact [facility] for appointment if occur - photoprotection with sun protective clothing; sunglasses and broad spectrum sunscreen with SPF of at least 30 + and frequent self skin exams recommended - yearly exams by a dermatologist recommended for persons with history of PreCancerous Actinic Keratoses   ANDROGENETIC ALOPECIA (MALE PATTERN HAIR LOSS) Exam: Frontal scalp thinning with intact frontal hairline and miniaturization and bitemporal recession.  Chronic and persistent condition with duration or  expected duration over one year. Condition is symptomatic/ bothersome to patient. Not currently at goal.   Androgenetic Alopecia (or Male pattern hair loss) refers to the common patterned hair loss affecting many men.  Male pattern alopecia is mediated by dihydrotestosterone which induces miniaturization of androgen-sensitive hair follicles.  It is chronic and persistent, but treatable; not curable. Topical treatment includes: - 5% topical Minoxidil Oral treatment includes: - Finasteride 1 mg qd - Minoxidil 1.25 - 5 mg qd - Dutasteride 0.5 mg qd Adjunct therapy includes: - Low Level Laser Light Therapy (LLLT) - Platelet-rich Plasma injections (PRP) - Hair Transplantation or scalp reduction  Treatment Plan: Patient will discuss oral Minoxidil with cardiologist. If approved, will start 2.5 mg Po qd, and may increase to 5 mg daily if well tolerated. Also discussed adding Finasteride 1 mg PO qd Continue current hair treatment (shampoo and rinse).    Doses of minoxidil for hair loss are considered 'low dose'. This is because the doses used for hair loss are much lower than the doses which are used for conditions such as high blood pressure (hypertension). The doses used for hypertension are 10-40mg  per day.  Side effects are uncommon at the low doses (up to 2.5 mg/day) used to treat hair loss. Potential side effects, more commonly seen at higher doses, include: Increase in hair growth (hypertrichosis) elsewhere on face and body Temporary hair shedding upon starting medication which may last up to 4 weeks Ankle swelling, fluid retention, rapid weight gain more than 5 pounds Low blood pressure and feeling lightheaded or dizzy when standing up quickly Fast or irregular heartbeat Headaches  Counseled that finasteride can decrease libido (sexual drive). Advised it should not be taken by pregnant women or women who could become pregnant. Advised not to donate blood products while taking this  medication. Advised if medication is stopped, they may lose the hair the medication has been helping to grow.   Return as scheduled, for AKs, recheck BCC and SCC sites (not treated).  ICherlyn Labella, CMA, am acting as scribe for Willeen Niece, MD .   Documentation: I have reviewed the above documentation for accuracy and completeness, and I agree with the above.  Willeen Niece, MD

## 2023-05-03 NOTE — Patient Instructions (Addendum)
Androgenetic Alopecia (or Male pattern hair loss) refers to the common patterned hair loss affecting many men.  Male pattern alopecia is mediated by dihydrotestosterone which induces miniaturization of androgen-sensitive hair follicles.  It is chronic and persistent, but treatable; not curable. Topical treatment includes: - 5% topical Minoxidil Oral treatment includes: - Finasteride 1 mg qd - Minoxidil 1.25 - 5 mg qd - Dutasteride 0.5 mg qd Adjunct therapy includes: - Low Level Laser Light Therapy (LLLT) - Platelet-rich Plasma injections (PRP) - Hair Transplantation or scalp reduction  Recommend starting moisturizer with exfoliant (Urea, Salicylic acid, or Lactic acid) one to two times daily to help smooth rough and bumpy skin.  OTC options include Cetaphil Rough and Bumpy lotion (Urea), Eucerin Roughness Relief lotion or spot treatment cream (Urea), CeraVe SA lotion/cream for Rough and Bumpy skin (Sal Acid), Gold Bond Rough and Bumpy cream (Sal Acid), and AmLactin 12% lotion/cream (Lactic Acid).  If applying in morning, also apply sunscreen to sun-exposed areas, since these exfoliating moisturizers can increase sensitivity to sun.   Due to recent changes in healthcare laws, you may see results of your pathology and/or laboratory studies on MyChart before the doctors have had a chance to review them. We understand that in some cases there may be results that are confusing or concerning to you. Please understand that not all results are received at the same time and often the doctors may need to interpret multiple results in order to provide you with the best plan of care or course of treatment. Therefore, we ask that you please give [place] 2 business days to thoroughly review all your results before contacting the office for clarification. Should we see a critical lab result, you will be contacted sooner.   If You Need Anything After Your Visit  If you have any questions or concerns for your doctor,  please call our main line at [phone] and press option 4 to reach your doctor's medical assistant. If no one answers, please leave a voicemail as directed and we will return your call as soon as possible. Messages left after 4 pm will be answered the following business day.   You may also send [place] a message via MyChart. We typically respond to MyChart messages within 1-2 business days.  For prescription refills, please ask your pharmacy to contact our office. Our fax number is [phone].  If you have an urgent issue when the clinic is closed that cannot wait until the next business day, you can page your doctor at the number below.    Please note that while we do our best to be available for urgent issues outside of office hours, we are not available 24/7.   If you have an urgent issue and are unable to reach [place], you may choose to seek medical care at your doctor's office, retail clinic, urgent care center, or emergency room.  If you have a medical emergency, please immediately call 911 or go to the emergency department.  Pager Numbers  - Dr. Gwen Pounds: [phone]  - Dr. Neale Burly: [phone]  - Dr. Roseanne Reno: [phone]  In the event of inclement weather, please call our main line at [phone] for an update on the status of any delays or closures.  Dermatology Medication Tips: Please keep the boxes that topical medications come in in order to help keep track of the instructions about where and how to use these. Pharmacies typically print the medication instructions only on the boxes and not directly on the medication tubes.  If your medication is too expensive, please contact our office at [phone] option 4 or send [place] a message through MyChart.   We are unable to tell what your co-pay for medications will be in advance as this is different depending on your insurance coverage. However, we may be able to find a substitute medication at lower cost or fill out paperwork to get  insurance to cover a needed medication.   If a prior authorization is required to get your medication covered by your insurance company, please allow [place] 1-2 business days to complete this process.  Drug prices often vary depending on where the prescription is filled and some pharmacies may offer cheaper prices.  The website www.goodrx.com contains coupons for medications through different pharmacies. The prices here do not account for what the cost may be with help from insurance (it may be cheaper with your insurance), but the website can give you the price if you did not use any insurance.  - You can print the associated coupon and take it with your prescription to the pharmacy.  - You may also stop by our office during regular business hours and pick up a GoodRx coupon card.  - If you need your prescription sent electronically to a different pharmacy, notify our office through [facility] or by phone at [phone] option 4.     Si Usted Necesita Algo Despus de Su Visita  Tambin puede enviarnos un mensaje a travs de Clinical cytogeneticist. Por lo general respondemos a los mensajes de MyChart en el transcurso de 1 a 2 das hbiles.  Para renovar recetas, por favor pida a su farmacia que se ponga en contacto con nuestra oficina. Annie Sable de fax es [place] [phone].  Si tiene un asunto urgente cuando la clnica est cerrada y que no puede esperar hasta el siguiente da hbil, puede llamar/localizar a su doctor(a) al nmero que aparece a continuacin.   Por favor, tenga en cuenta que aunque hacemos todo lo posible para estar disponibles para asuntos urgentes fuera del horario de [place], no estamos disponibles las 24 horas del da, los 7 [address]  [address] de la [place].   Si tiene un problema urgente y no puede comunicarse con nosotros, puede optar por buscar atencin mdica  en el consultorio de su doctor(a), en una clnica privada, en un centro de atencin urgente o en una sala de emergencias.  Si  tiene Engineer, drilling, por favor llame inmediatamente al 911 o vaya a la sala de emergencias.  Nmeros de bper  - Dr. Gwen Pounds: [phone]  - Dra. Moye: [phone]  - Dra. Roseanne Reno: [phone]  En caso de inclemencias del [place], por favor llame a Lacy Duverney principal al [phone] para una actualizacin sobre el [place] de cualquier retraso o cierre.  Consejos para la medicacin en dermatologa: Por favor, guarde las cajas en las que vienen los medicamentos de uso tpico para ayudarle a seguir las instrucciones sobre dnde y cmo usarlos. Las farmacias generalmente imprimen las instrucciones del medicamento slo en las cajas y no directamente en los tubos del [place].   Si su medicamento es muy caro, por favor, pngase en contacto con Rolm Gala llamando al [phone] y presione la opcin 4 o envenos un mensaje a travs de Clinical cytogeneticist.   No podemos decirle cul ser su copago por los medicamentos por adelantado ya que esto es diferente dependiendo de la cobertura de su seguro. Sin embargo, es posible que podamos encontrar un medicamento sustituto a Therapist, occupational  formulario para que el seguro cubra el medicamento que se considera necesario.   Si se requiere una autorizacin previa para que su compaa de seguros [place] su medicamento, por favor permtanos de 1 a 2 das hbiles para completar [address]  Los precios de los medicamentos varan con frecuencia dependiendo del Environmental consultant de dnde se surte la receta y alguna farmacias pueden ofrecer precios ms baratos.  El sitio web www.goodrx.com tiene cupones para medicamentos de Health and safety inspector. Los precios aqu no tienen en cuenta lo que podra costar con la ayuda del seguro (puede ser ms barato con su seguro), pero el sitio web puede darle el precio si no utiliz Tourist information centre manager.  - Puede imprimir el cupn correspondiente y llevarlo con su receta a la farmacia.  - Tambin puede pasar por nuestra oficina  durante el horario de atencin regular y Education officer, museum una tarjeta de cupones de GoodRx.  - Si necesita que su receta se enve electrnicamente a una farmacia diferente, informe a nuestra oficina a travs de MyChart de  o por telfono llamando al [phone] y presione la opcin 4.

## 2023-05-11 DIAGNOSIS — I10 Essential (primary) hypertension: Secondary | ICD-10-CM | POA: Diagnosis not present

## 2023-05-11 DIAGNOSIS — M1990 Unspecified osteoarthritis, unspecified site: Secondary | ICD-10-CM | POA: Diagnosis not present

## 2023-05-11 DIAGNOSIS — Z96642 Presence of left artificial hip joint: Secondary | ICD-10-CM | POA: Diagnosis not present

## 2023-05-11 DIAGNOSIS — K649 Unspecified hemorrhoids: Secondary | ICD-10-CM | POA: Diagnosis not present

## 2023-05-11 DIAGNOSIS — M48061 Spinal stenosis, lumbar region without neurogenic claudication: Secondary | ICD-10-CM | POA: Diagnosis not present

## 2023-05-11 DIAGNOSIS — N400 Enlarged prostate without lower urinary tract symptoms: Secondary | ICD-10-CM | POA: Diagnosis not present

## 2023-05-11 DIAGNOSIS — I4891 Unspecified atrial fibrillation: Secondary | ICD-10-CM | POA: Diagnosis not present

## 2023-05-11 DIAGNOSIS — Z9181 History of falling: Secondary | ICD-10-CM | POA: Diagnosis not present

## 2023-05-11 DIAGNOSIS — Z7901 Long term (current) use of anticoagulants: Secondary | ICD-10-CM | POA: Diagnosis not present

## 2023-05-11 DIAGNOSIS — E785 Hyperlipidemia, unspecified: Secondary | ICD-10-CM | POA: Diagnosis not present

## 2023-05-11 DIAGNOSIS — Z556 Problems related to health literacy: Secondary | ICD-10-CM | POA: Diagnosis not present

## 2023-05-11 DIAGNOSIS — C44311 Basal cell carcinoma of skin of nose: Secondary | ICD-10-CM | POA: Diagnosis not present

## 2023-05-17 DIAGNOSIS — Z7901 Long term (current) use of anticoagulants: Secondary | ICD-10-CM | POA: Diagnosis not present

## 2023-05-17 DIAGNOSIS — C44311 Basal cell carcinoma of skin of nose: Secondary | ICD-10-CM | POA: Diagnosis not present

## 2023-05-17 DIAGNOSIS — I4891 Unspecified atrial fibrillation: Secondary | ICD-10-CM | POA: Diagnosis not present

## 2023-05-17 DIAGNOSIS — E785 Hyperlipidemia, unspecified: Secondary | ICD-10-CM | POA: Diagnosis not present

## 2023-05-17 DIAGNOSIS — N400 Enlarged prostate without lower urinary tract symptoms: Secondary | ICD-10-CM | POA: Diagnosis not present

## 2023-05-17 DIAGNOSIS — M1990 Unspecified osteoarthritis, unspecified site: Secondary | ICD-10-CM | POA: Diagnosis not present

## 2023-05-17 DIAGNOSIS — M48061 Spinal stenosis, lumbar region without neurogenic claudication: Secondary | ICD-10-CM | POA: Diagnosis not present

## 2023-05-17 DIAGNOSIS — Z9181 History of falling: Secondary | ICD-10-CM | POA: Diagnosis not present

## 2023-05-17 DIAGNOSIS — Z556 Problems related to health literacy: Secondary | ICD-10-CM | POA: Diagnosis not present

## 2023-05-17 DIAGNOSIS — Z96642 Presence of left artificial hip joint: Secondary | ICD-10-CM | POA: Diagnosis not present

## 2023-05-17 DIAGNOSIS — I10 Essential (primary) hypertension: Secondary | ICD-10-CM | POA: Diagnosis not present

## 2023-05-17 DIAGNOSIS — K649 Unspecified hemorrhoids: Secondary | ICD-10-CM | POA: Diagnosis not present

## 2023-05-18 ENCOUNTER — Encounter: Payer: Self-pay | Admitting: Cardiology

## 2023-05-18 ENCOUNTER — Ambulatory Visit: Payer: Medicare Other | Attending: Cardiology | Admitting: Cardiology

## 2023-05-18 ENCOUNTER — Ambulatory Visit (INDEPENDENT_AMBULATORY_CARE_PROVIDER_SITE_OTHER): Payer: Medicare Other

## 2023-05-18 VITALS — BP 138/80 | HR 64 | Ht 70.0 in | Wt 242.1 lb

## 2023-05-18 DIAGNOSIS — E785 Hyperlipidemia, unspecified: Secondary | ICD-10-CM

## 2023-05-18 DIAGNOSIS — I2510 Atherosclerotic heart disease of native coronary artery without angina pectoris: Secondary | ICD-10-CM

## 2023-05-18 DIAGNOSIS — I10 Essential (primary) hypertension: Secondary | ICD-10-CM | POA: Diagnosis not present

## 2023-05-18 DIAGNOSIS — I480 Paroxysmal atrial fibrillation: Secondary | ICD-10-CM

## 2023-05-18 DIAGNOSIS — I4891 Unspecified atrial fibrillation: Secondary | ICD-10-CM

## 2023-05-18 MED ORDER — FUROSEMIDE 20 MG PO TABS: 10.0000 mg | ORAL_TABLET | Freq: Every day | ORAL | 3 refills | Status: DC | PRN

## 2023-05-18 MED ORDER — METOPROLOL TARTRATE 25 MG PO TABS: 25.0000 mg | ORAL_TABLET | Freq: Two times a day (BID) | ORAL | 3 refills | Status: DC

## 2023-05-18 NOTE — Assessment & Plan Note (Signed)
By definition, paroxysmal atrial fibrillation for now.  He seems to be maintaining normal rhythm on amiodarone.  Feeling notably improved.  With A-fib RVR he did not have any anginal symptoms so we will hold off on stress test evaluation for now, but the presence of PVCs versus aberrantly conducted beats may warrant evaluation in the future.  Thankfully his echo looks okay.  I do not think that amiodarone is a great long choice for him and therefore we will go ahead and have him stop amiodarone for now and then reassess A-fib burden after about 6 weeks which would allow time for the amiodarone to wear off.  Will start Lopressor with the plan for rate control for.  If he proves to have significant breakthrough episodes, would probably consider reinitiating at least PRN pill in the pocket amiodarone.  Based on his CHA2DS2-VASc score, warrants being on anticoagulation.  He was started on Eliquis.  I explained to him why TID dosing important and that at a lower dose he would would not have control or risk factor modification.  Plan: DC amiodarone Initiated blood pressure 25 mg twice daily 14-day Zio patch monitor to be worn at least 6 weeks after stopping amiodarone. Will plan to use amiodarone 400 mg twice daily for a week as needed for breakthrough spells of A-fib in the future.

## 2023-05-18 NOTE — Assessment & Plan Note (Signed)
Did not endoscopic at this point, but right now I am deferring to PCP.

## 2023-05-18 NOTE — Assessment & Plan Note (Signed)
He was given a diagnosis of coronary disease however are not sure where it came from.  He does not recall having any diagnosis of CAD with any type of heart catheterization center.  Regardless, based on concern with his new diagnosis of A-fib, could consider ischemic evaluation.  Thankfully, since he did not have any angina symptoms, will hold off on stress test for now.  Low threshold to consider Coronary CTA.  For now continue close monitoring for symptoms.

## 2023-05-18 NOTE — Progress Notes (Signed)
Primary Care Provider: Joaquim Nam, MD     Telehealth [facility] Cardiologist: None Electrophysiologist: None  Clinic Note: Chief Complaint  Patient presents with   New Patient (Initial Visit)    Establish care for A-Fib; recently was at [facility] and diagnosed with new A-Fib. Patient c/o shortness of breath. Medications reviewed by the patient verbally.    Atrial Fibrillation    New onset of A-fib.  Now here for hospital follow-up.   ===================================  ASSESSMENT/PLAN   Problem List Items Addressed This Visit       Cardiology Problems   HTN (hypertension) (Chronic)   Relevant Medications   metoprolol tartrate (LOPRESSOR) 25 MG tablet   furosemide (LASIX) 20 MG tablet   HLD (hyperlipidemia) (Chronic)    Did not endoscopic at this point, but right now I am deferring to PCP.      Relevant Medications   metoprolol tartrate (LOPRESSOR) 25 MG tablet   furosemide (LASIX) 20 MG tablet   Coronary artery disease (Chronic)    He was given a diagnosis of coronary disease however are not sure where it came from.  He does not recall having any diagnosis of CAD with any type of heart catheterization center.  Regardless, based on concern with his new diagnosis of A-fib, could consider ischemic evaluation.  Thankfully, since he did not have any angina symptoms, will hold off on stress test for now.  Low threshold to consider Coronary CTA.  For now continue close monitoring for symptoms.      Relevant Medications   metoprolol tartrate (LOPRESSOR) 25 MG tablet   furosemide (LASIX) 20 MG tablet   Atrial fibrillation (HCC) - Primary (Chronic)    By definition, paroxysmal atrial fibrillation for now.  He seems to be maintaining normal rhythm on amiodarone.  Feeling notably improved.  With A-fib RVR he did not have any anginal symptoms so we will hold off on stress test evaluation for now, but the presence of PVCs versus  aberrantly conducted beats may warrant evaluation in the future.  Thankfully his echo looks okay.  I do not think that amiodarone is a great long choice for him and therefore we will go ahead and have him stop amiodarone for now and then reassess A-fib burden after about 6 weeks which would allow time for the amiodarone to wear off.  Will start Lopressor with the plan for rate control for.  If he proves to have significant breakthrough episodes, would probably consider reinitiating at least PRN pill in the pocket amiodarone.  Based on his CHA2DS2-VASc score, warrants being on anticoagulation.  He was started on Eliquis.  I explained to him why TID dosing important and that at a lower dose he would would not have control or risk factor modification.  Plan: DC amiodarone Initiated blood pressure 25 mg twice daily 14-day Zio patch monitor to be worn at least 6 weeks after stopping amiodarone. Will plan to use amiodarone 400 mg twice daily for a week as needed for breakthrough spells of A-fib in the future.      Relevant Medications   metoprolol tartrate (LOPRESSOR) 25 MG tablet   furosemide (LASIX) 20 MG tablet   Other Relevant Orders   EKG 12-Lead   LONG TERM MONITOR (3-14 DAYS)   ===================================  HPI:    Todd Gonzales is a 82 y.o. male who is being seen today for the evaluation of NEW DIAGNOSIS OF ATRIAL FIBRILLATION at the request  of Joaquim Nam, MD.  Recent Hospitalizations:  5/10-11/2023: Sent from PCPs office (seen by Mordecai Maes, NP) -> noted shortness of breath and fatigue.  Was still on Cipro for UTI? Marland Kitchen  Was unable to stand long wait for them to make the bed.  Symptoms began back on May 4.  Was very sleepy.  Not able to stay awake enough to even go to the mailbox.  Frequent naps.  Noticing exertional dyspnea and some leg swelling. => EKG showed A-fib/flutter with PVCs.  Rapid rate. => Referred to [facility] ER. Admitted for new onset A-fib RVR.  Started on  diltiazem infusion, converted to amiodarone.  Started on Eliquis.  Chemically converted to sinus rhythm and transition to amiodarone p.o.  Thought to have some mild CHF (echocardiogram normal, patient has some mild edema.) and given a dose of Lasix.  Planned for cardiology follow-up. Benazepril and HCTZ discontinued.  SAYLOR BANFILL was last seen on 04/23/2023 by Dr. Para March requested earlier cardiology follow-up in July.  Patient indicated that he is feeling better since discharge.  BP controlled off of ACE inhibitor and HCTZ with just furosemide.  Reviewed  CV studies:    The following studies were reviewed today: (if available, images/films reviewed: From Epic Chart or Care Everywhere) TTE 04/14/2023: Normal LV size and function.  EF 60 to 65%.  No RWMA.  Mild LVH.  Normal RV.  AOV sclerosis but no stenosis but otherwise normal valves.  This patients CHA2DS2-VASc Score and unadjusted Ischemic Stroke Rate (% per year) is equal to 7.2 % stroke rate/year from a score of 5  Above score calculated as 1 point each if present [CHF, HTN, DM, Vascular=MI/PAD/Aortic Plaque, Age if 65-74, or Male] Above score calculated as 2 points each if present [Age > 75, or Stroke/TIA/TE]   Interval History:   FRIEDRICH ALDRIDGE presents here today to establish cardiology care.  He states that since being in the hospital he has been doing quite well.  He has not had any more breakthrough spells to suggest any recurrence of A-fib.  No irregular heartbeats.  His edema has notably improved in fact he says that the 40 mg Lasix probably too much for him.  He is only taking as needed now maybe once a week.  When he takes it he feels like he is peeing all day and all night.  Had previously been on 10 mg.  He is not having any adverse effects of amiodarone such as nausea or vomiting or fatigue.  Acknowledged that he has not really been all that active since he left the hospital.  As it stands, no chest tightness or pressure at  rest or exertion.  He is deconditioned and has some ankle swelling that is pretty much stable for him now.  He does not do a lot of activity and therefore will have some baseline exertional dyspnea but nothing worse than his baseline.  He indicated that he did not have any chest tightness or pressure symptoms prior to cardioversion, just the profound dyspnea and fatigue.  Since converting to sinus rhythm has been doing fine.  No bleeding issues on Eliquis but was wondering about potentially being able to take it once a day as opposed to twice a day-ostensibly having some bruising.  CV Review of Symptoms (Summary): positive for - dyspnea on exertion, edema, and both of these are stable.  Notably improved since conversion; mild bruising. negative for - chest pain, irregular heartbeat, orthopnea, palpitations, paroxysmal nocturnal  dyspnea, rapid heart rate, shortness of breath, or syncope or near syncope or TIA sounds to be excellent claudication.  Overt melena, hematochezia, hematuria or epistaxis.  REVIEWED OF SYSTEMS   Review of Systems  Constitutional:  Negative for malaise/fatigue (Deconditioned but active) and weight loss.  HENT:  Negative for congestion.   Respiratory:  Negative for cough and shortness of breath.   Cardiovascular:  Positive for leg swelling. Negative for chest pain and claudication.  Gastrointestinal:  Negative for abdominal pain, blood in stool and melena.  Genitourinary:  Negative for hematuria.  Musculoskeletal:  Positive for joint pain. Negative for falls.  Neurological:  Negative for dizziness and focal weakness.  Psychiatric/Behavioral: Negative.      I have reviewed and (if needed) personally updated the patient's problem list, medications, allergies, past medical and surgical history, social and family history.   PAST MEDICAL HISTORY   Past Medical History:  Diagnosis Date   Actinic keratosis 03/28/2023   glabella, needs LN2   Arthritis    Basal cell  carcinoma 03/28/2023   right paranasal, needs mohs   BPH (benign prostatic hyperplasia)    nocturia x4 at baseline   H/O hiatal hernia    Hemorrhoids    Hyperlipidemia    Hypertension    Dr Wylene Simmer- LOV with clearance 3/13 on chart   IGT (impaired glucose tolerance)    last AIC 5.5- diet controlled- per office note 3/13   Paroxysmal atrial fibrillation (HCC) 04/14/2023   Admitted with A-fib RVR.  Normal echo.  Discharged on amiodarone.   SCC (squamous cell carcinoma) 02/10/2005   scc in situ-left forearm.;  And multiple other places.   Seasonal allergies    Spinal stenosis    UTI (lower urinary tract infection)    required admission to [facility]    PAST SURGICAL HISTORY   Past Surgical History:  Procedure Laterality Date   BACK SURGERY  2008   Laminectomy L4-5   JOINT REPLACEMENT Bilateral    bil   KNEE ARTHROSCOPY     right   LUMBAR LAMINECTOMY/DECOMPRESSION MICRODISCECTOMY Bilateral 11/20/2018   Procedure: Bilateral Thoracic Twelve to Lumbar One Laminectomy;  Surgeon: Barnett Abu, MD;  Location: [facility] OR;  Service: Neurosurgery;  Laterality: Bilateral;  posterior   TOTAL HIP ARTHROPLASTY  03/20/2012   Procedure: TOTAL HIP ARTHROPLASTY ANTERIOR APPROACH;  Surgeon: Shelda Pal, MD;  Location: [facility] ORS;  Service: Orthopedics;  Laterality: Left;   MEDICATIONS/ALLERGIES   Current Meds  Medication Sig   acetaminophen (TYLENOL) 650 MG CR tablet Take 1,300 mg by mouth 2 (two) times daily as needed for pain.   apixaban (ELIQUIS) 5 MG TABS tablet Take 1 tablet (5 mg total) by mouth 2 (two) times daily.   Camphor-Menthol-Methyl Sal (SALONPAS EX) Apply 1 patch topically daily as needed (pain).   Cholecalciferol (VITAMIN D-3 PO) Take 1 capsule by mouth daily.   cyanocobalamin (VITAMIN B12) 1000 MCG/ML injection Inject 1 mL (1,000 mcg total) into the muscle every 30 (thirty) days.   diclofenac Sodium (VOLTAREN) 1 % GEL Apply 1 Application topically every 6 (six) hours as needed (pain).    furosemide (LASIX) 20 MG tablet Take 0.5 tablets (10 mg total) by mouth daily as needed.   gabapentin (NEURONTIN) 300 MG capsule TAKE ONE CAPSULE BY MOUTH ONCE DAILY   hydrocortisone (PROCTOSOL HC) 2.5 % rectal cream Place 1 application. rectally 2 (two) times daily.   MAGNESIUM OXIDE PO Take 1 tablet by mouth daily.   metoprolol tartrate (LOPRESSOR) 25 MG  tablet Take 1 tablet (25 mg total) by mouth 2 (two) times daily.   mometasone (ELOCON) 0.1 % cream Spot treat thicker areas on lower legs once to twice daily until improved. Avoid face, groin, axilla.   ondansetron (ZOFRAN) 4 MG tablet Take 1 tablet (4 mg total) by mouth every 8 (eight) hours as needed for nausea or vomiting.   OVER THE COUNTER MEDICATION Place 1-2 sprays into the nose daily as needed (allergies, nasal congestion). Unknown nasal spray.   polyethylene glycol (MIRALAX / GLYCOLAX) 17 g packet Take 17 g by mouth daily as needed for mild constipation.   tamsulosin (FLOMAX) 0.4 MG CAPS capsule TAKE TWO CAPSULE BY MOUTH DAILY   [DISCONTINUED] amiodarone (PACERONE) 200 MG tablet Take 200 mg by mouth daily.   [DISCONTINUED] furosemide (LASIX) 40 MG tablet Take 40 mg by mouth daily as needed.    Allergies  Allergen Reactions   Codeine Other (See Comments)    Unknown reaction   Lipitor [Atorvastatin] Other (See Comments)    Arthralgias    Bactrim [Sulfamethoxazole-Trimethoprim] Rash    SOCIAL HISTORY/FAMILY HISTORY   Reviewed in Epic:   Social History   Tobacco Use   Smoking status: Never   Smokeless tobacco: Never  Vaping Use   Vaping Use: Never used  Substance Use Topics   Alcohol use: Yes    Comment: socially, wine   Drug use: No   Social History   Social History Narrative   Plays bridge   [employer]     2 kids, one local.     [employer] grad.     Married 1962   Prev in Engineer, maintenance (IT), retired.     Family History  Problem Relation Age of Onset   Arthritis Mother    Lung cancer Father    Asthma  Father    Colon cancer Neg Hx    Prostate cancer Neg Hx     OBJCTIVE -PE, EKG, labs   Wt Readings from Last 3 Encounters:  05/18/23 242 lb 2 oz (109.8 kg)  04/26/23 244 lb (110.7 kg)  04/20/23 243 lb (110.2 kg)    Physical Exam: BP 138/80 (BP Location: Right Arm, Patient Position: Sitting, Cuff Size: Normal)   Pulse 64   Ht 5\' 10"  (1.778 m)   Wt 242 lb 2 oz (109.8 kg)   SpO2 95%   BMI 34.74 kg/m  Physical Exam Vitals reviewed.  Constitutional:      General: He is not in acute distress.    Appearance: Normal appearance. He is obese. He is not ill-appearing or toxic-appearing.  HENT:     Head: Normocephalic and atraumatic.  Neck:     Vascular: No carotid bruit or JVD (JVP 7 to 8 cm water.).  Cardiovascular:     Rate and Rhythm: Normal rate and regular rhythm. Occasional Extrasystoles are present.    Chest Wall: PMI is not displaced.     Pulses: Normal pulses.     Heart sounds: Normal heart sounds, S1 normal and S2 normal. No murmur heard.    No friction rub. No gallop.  Pulmonary:     Effort: Pulmonary effort is normal. No respiratory distress.     Breath sounds: Normal breath sounds. No wheezing, rhonchi or rales.  Chest:     Chest wall: No tenderness.  Abdominal:     General: Bowel sounds are normal.     Comments: Notable probable obesity.  Otherwise soft/NT/ND centimeters.  Neuroexam.  Musculoskeletal:  Cervical back: Normal range of motion and neck supple.     Right lower leg: Edema (1+ ankle) present.     Left lower leg: Edema (1+ ankle) present.  Skin:    General: Skin is warm and dry.  Neurological:     General: No focal deficit present.     Mental Status: He is alert and oriented to person, place, and time.     Gait: Gait normal.  Psychiatric:        Mood and Affect: Mood normal.        Behavior: Behavior normal.        Thought Content: Thought content normal.        Judgment: Judgment normal.     Adult ECG Report  Rate: 64;  Rhythm: normal sinus  rhythm and normal axis, normal durations. ;   Narrative Interpretation: Normal  Recent Labs: Reviewed Lab Results  Component Value Date   CHOL 109 04/16/2023   HDL 41 04/16/2023   LDLCALC 58 04/16/2023   TRIG 51 04/16/2023   CHOLHDL 2.7 04/16/2023   Lab Results  Component Value Date   CREATININE 1.05 04/20/2023   BUN 22 04/20/2023   NA 144 04/20/2023   K 3.8 04/20/2023   CL 102 04/20/2023   CO2 33 (H) 04/20/2023      Latest Ref Rng & Units 04/16/2023    2:42 AM 04/15/2023    2:20 AM 04/14/2023    2:42 PM  CBC  WBC 4.0 - 10.5 K/uL 8.3  8.7    Hemoglobin 13.0 - 17.0 g/dL 16.1  09.6  04.5   Hematocrit 39.0 - 52.0 % 39.8  41.1  43.0   Platelets 150 - 400 K/uL 137  141      Lab Results  Component Value Date   HGBA1C 5.3 12/26/2022   Lab Results  Component Value Date   TSH 1.446 04/16/2023    ================================================== I spent a total of  24 minutes with the patient spent in direct patient consultation.  Additional time spent with chart review  / charting (studies, outside notes, etc): 31 min Total Time: 55 min  Current medicines are reviewed at length with the patient today.  (+/- concerns) N/A  Notice: This dictation was prepared with Dragon dictation along with smart phrase technology. Any transcriptional errors that result from this process are unintentional and may not be corrected upon review.   Studies Ordered:  Orders Placed This Encounter  Procedures   LONG TERM MONITOR (3-14 DAYS)   EKG 12-Lead   Meds ordered this encounter  Medications   metoprolol tartrate (LOPRESSOR) 25 MG tablet    Sig: Take 1 tablet (25 mg total) by mouth 2 (two) times daily.    Dispense:  60 tablet    Refill:  3   furosemide (LASIX) 20 MG tablet    Sig: Take 0.5 tablets (10 mg total) by mouth daily as needed.    Dispense:  45 tablet    Refill:  3    Patient Instructions / Medication Changes & Studies & Tests Ordered   Patient Instructions   Medication Instructions:  Your physician has recommended you make the following change in your medication:   Stop taking Amiodarone  Start taking Metoprolol 25 mg Twice daily  Lasix decreased to 10 mg daily as needed  *If you need a refill on your cardiac medications before your next appointment, please call your pharmacy*   Lab Work: none If you have labs (blood work) drawn  today and your tests are completely normal, you will receive your results only by: MyChart Message (if you have MyChart) OR A paper copy in the mail If you have any lab test that is abnormal or we need to change your treatment, we will call you to review the results.   Testing/Procedures: Heart Monitor:  Length of Wear: 14 days   Your monitor will be mailed to your home address within 3-5 business days. However, if you have not received your monitor after 5 business days please send [place] a MyChart message or call the office at [phone], so we may follow up on this for you.   Your physician has recommended that you wear a Zio XT monitor     Follow-Up: At [facility], you and your health needs are our priority.  As part of our continuing mission to provide you with exceptional heart care, we have created designated Provider Care Teams.  These Care Teams include your primary Cardiologist (physician) and Advanced Practice Providers (APPs -  Physician Assistants and Nurse Practitioners) who all work together to provide you with the care you need, when you need it.  We recommend signing up for the patient portal called "MyChart".  Sign up information is provided on this After Visit Summary.  MyChart is used to connect with patients for Virtual Visits (Telemedicine).  Patients are able to view lab/test results, encounter notes, upcoming appointments, etc.  Non-urgent messages can be sent to your provider as well.   To learn more about what you can do with MyChart, go to [email or web address].    Your  next appointment:   3 month(s)  Provider:   Bryan Lemma, MD      Marykay Lex, MD, MS Bryan Lemma, M.D., M.S. Interventional Cardiologist  [facility]   [address] [place], [place]  [address] [phone]           Fax [phone]    Thank you for choosing [facility] in [place]!!

## 2023-05-18 NOTE — Patient Instructions (Signed)
Medication Instructions:  Your physician has recommended you make the following change in your medication:   Stop taking Amiodarone  Start taking Metoprolol 25 mg Twice daily  Lasix decreased to 10 mg daily as needed  *If you need a refill on your cardiac medications before your next appointment, please call your pharmacy*   Lab Work: none If you have labs (blood work) drawn today and your tests are completely normal, you will receive your results only by: MyChart Message (if you have MyChart) OR A paper copy in the mail If you have any lab test that is abnormal or we need to change your treatment, we will call you to review the results.   Testing/Procedures: Heart Monitor:  Length of Wear: 14 days   Your monitor will be mailed to your home address within 3-5 business days. However, if you have not received your monitor after 5 business days please send [place] a MyChart message or call the office at [phone], so we may follow up on this for you.   Your physician has recommended that you wear a Zio XT monitor.   This monitor is a medical device that records the heart's electrical activity. Doctors most often use these monitors to diagnose arrhythmias. Arrhythmias are problems with the speed or rhythm of the heartbeat. The monitor is a small device applied to your chest. You can wear one while you do your normal daily activities. While wearing this monitor if you have any symptoms to push the button and record what you felt. Once you have worn this monitor for the period of time provider prescribed (Usually 14 days), you will return the monitor device in the postage paid box. Once it is returned they will download the data collected and provide [place] with a report which the provider will then review and we will call you with those results. Important tips:  Avoid showering during the first 24 hours of wearing the monitor. Avoid excessive sweating to help maximize wear time. Do not submerge  the device, no hot tubs, and no swimming pools. Keep any lotions or oils away from the patch. After 24 hours you may shower with the patch on. Take brief showers with your back facing the shower head.  Do not remove patch once it has been placed because that will interrupt data and decrease adhesive wear time. Push the button when you have any symptoms and write down what you were feeling. Once you have completed wearing your monitor, remove and place into box which has postage paid and place in your outgoing mailbox.  If for some reason you have misplaced your box then call our office and we can provide another box and/or mail it off for you.       Follow-Up: At [facility], you and your health needs are our priority.  As part of our continuing mission to provide you with exceptional heart care, we have created designated Provider Care Teams.  These Care Teams include your primary Cardiologist (physician) and Advanced Practice Providers (APPs -  Physician Assistants and Nurse Practitioners) who all work together to provide you with the care you need, when you need it.  We recommend signing up for the patient portal called "MyChart".  Sign up information is provided on this After Visit Summary.  MyChart is used to connect with patients for Virtual Visits (Telemedicine).  Patients are able to view lab/test results, encounter notes, upcoming appointments, etc.  Non-urgent messages can be sent to your  provider as well.   To learn more about what you can do with MyChart, go to [email or web address].    Your next appointment:   3 month(s)  Provider:   Bryan Lemma, MD

## 2023-05-25 DIAGNOSIS — K080 Exfoliation of teeth due to systemic causes: Secondary | ICD-10-CM | POA: Diagnosis not present

## 2023-05-30 DIAGNOSIS — I10 Essential (primary) hypertension: Secondary | ICD-10-CM | POA: Diagnosis not present

## 2023-05-30 DIAGNOSIS — C44311 Basal cell carcinoma of skin of nose: Secondary | ICD-10-CM | POA: Diagnosis not present

## 2023-05-30 DIAGNOSIS — M48061 Spinal stenosis, lumbar region without neurogenic claudication: Secondary | ICD-10-CM | POA: Diagnosis not present

## 2023-05-30 DIAGNOSIS — I4891 Unspecified atrial fibrillation: Secondary | ICD-10-CM | POA: Diagnosis not present

## 2023-05-30 DIAGNOSIS — Z96642 Presence of left artificial hip joint: Secondary | ICD-10-CM | POA: Diagnosis not present

## 2023-05-30 DIAGNOSIS — N400 Enlarged prostate without lower urinary tract symptoms: Secondary | ICD-10-CM | POA: Diagnosis not present

## 2023-05-30 DIAGNOSIS — M1990 Unspecified osteoarthritis, unspecified site: Secondary | ICD-10-CM | POA: Diagnosis not present

## 2023-05-30 DIAGNOSIS — K649 Unspecified hemorrhoids: Secondary | ICD-10-CM | POA: Diagnosis not present

## 2023-05-30 DIAGNOSIS — Z9181 History of falling: Secondary | ICD-10-CM | POA: Diagnosis not present

## 2023-05-30 DIAGNOSIS — E785 Hyperlipidemia, unspecified: Secondary | ICD-10-CM | POA: Diagnosis not present

## 2023-05-30 DIAGNOSIS — Z7901 Long term (current) use of anticoagulants: Secondary | ICD-10-CM | POA: Diagnosis not present

## 2023-05-30 DIAGNOSIS — Z556 Problems related to health literacy: Secondary | ICD-10-CM | POA: Diagnosis not present

## 2023-05-31 DIAGNOSIS — K080 Exfoliation of teeth due to systemic causes: Secondary | ICD-10-CM | POA: Diagnosis not present

## 2023-06-05 ENCOUNTER — Ambulatory Visit: Payer: Medicare Other | Admitting: Cardiology

## 2023-06-06 ENCOUNTER — Ambulatory Visit: Payer: Medicare Other

## 2023-06-06 DIAGNOSIS — K080 Exfoliation of teeth due to systemic causes: Secondary | ICD-10-CM | POA: Diagnosis not present

## 2023-06-06 NOTE — Progress Notes (Deleted)
Per orders of Dr. Eustaquio Boyden, injection of b-12 given by Donnamarie Poag in right deltoid. Patient tolerated injection well. Patient will make appointment for 1 month.

## 2023-06-07 ENCOUNTER — Ambulatory Visit: Payer: Medicare Other

## 2023-06-07 DIAGNOSIS — E538 Deficiency of other specified B group vitamins: Secondary | ICD-10-CM

## 2023-06-12 DIAGNOSIS — K649 Unspecified hemorrhoids: Secondary | ICD-10-CM | POA: Diagnosis not present

## 2023-06-12 DIAGNOSIS — M48061 Spinal stenosis, lumbar region without neurogenic claudication: Secondary | ICD-10-CM | POA: Diagnosis not present

## 2023-06-12 DIAGNOSIS — I4891 Unspecified atrial fibrillation: Secondary | ICD-10-CM | POA: Diagnosis not present

## 2023-06-12 DIAGNOSIS — Z9181 History of falling: Secondary | ICD-10-CM | POA: Diagnosis not present

## 2023-06-12 DIAGNOSIS — M1990 Unspecified osteoarthritis, unspecified site: Secondary | ICD-10-CM | POA: Diagnosis not present

## 2023-06-12 DIAGNOSIS — Z7901 Long term (current) use of anticoagulants: Secondary | ICD-10-CM | POA: Diagnosis not present

## 2023-06-12 DIAGNOSIS — Z556 Problems related to health literacy: Secondary | ICD-10-CM | POA: Diagnosis not present

## 2023-06-12 DIAGNOSIS — E785 Hyperlipidemia, unspecified: Secondary | ICD-10-CM | POA: Diagnosis not present

## 2023-06-12 DIAGNOSIS — I10 Essential (primary) hypertension: Secondary | ICD-10-CM | POA: Diagnosis not present

## 2023-06-12 DIAGNOSIS — C44311 Basal cell carcinoma of skin of nose: Secondary | ICD-10-CM | POA: Diagnosis not present

## 2023-06-12 DIAGNOSIS — Z96642 Presence of left artificial hip joint: Secondary | ICD-10-CM | POA: Diagnosis not present

## 2023-06-12 DIAGNOSIS — N400 Enlarged prostate without lower urinary tract symptoms: Secondary | ICD-10-CM | POA: Diagnosis not present

## 2023-06-19 ENCOUNTER — Telehealth: Payer: Self-pay | Admitting: Cardiology

## 2023-06-19 NOTE — Telephone Encounter (Signed)
Email notification received from [employer] stating that the patient's ZIO XT, ordered by Dr. Herbie Baltimore on 05/18/23 is still pending return.  Serial #  [id number]   Will forward to covering RN for Dr. Herbie Baltimore as an Lorain Childes and to follow up with the patient.

## 2023-06-19 NOTE — Telephone Encounter (Signed)
Spoke with patient, he clarified that per the last office visit he was to wear the 14-day Zio patch monitor at least 6 weeks after stopping amiodarone.   Patient will wear monitor starting on 06/29/2023.

## 2023-06-20 ENCOUNTER — Ambulatory Visit (INDEPENDENT_AMBULATORY_CARE_PROVIDER_SITE_OTHER): Payer: Medicare Other

## 2023-06-20 DIAGNOSIS — E538 Deficiency of other specified B group vitamins: Secondary | ICD-10-CM | POA: Diagnosis not present

## 2023-06-20 MED ORDER — CYANOCOBALAMIN 1000 MCG/ML IJ SOLN
1000.0000 ug | Freq: Once | INTRAMUSCULAR | Status: AC
Start: 2023-06-20 — End: 2023-06-20
  Administered 2023-06-20: 1000 ug via INTRAMUSCULAR

## 2023-06-20 NOTE — Progress Notes (Signed)
Per orders of Dr. Elsie Stain, injection of B-12 given by Francella Solian in right deltoid. Patient tolerated injection well. Patient will make appointment for 1 month.

## 2023-06-27 ENCOUNTER — Ambulatory Visit: Payer: Medicare Other | Admitting: Dermatology

## 2023-07-05 ENCOUNTER — Encounter: Payer: Self-pay | Admitting: Nurse Practitioner

## 2023-07-05 ENCOUNTER — Telehealth: Payer: Self-pay | Admitting: Nurse Practitioner

## 2023-07-05 ENCOUNTER — Ambulatory Visit (INDEPENDENT_AMBULATORY_CARE_PROVIDER_SITE_OTHER): Payer: Medicare Other | Admitting: Nurse Practitioner

## 2023-07-05 VITALS — BP 132/80 | HR 62 | Temp 97.7°F | Ht 70.0 in | Wt 248.8 lb

## 2023-07-05 DIAGNOSIS — N3090 Cystitis, unspecified without hematuria: Secondary | ICD-10-CM

## 2023-07-05 DIAGNOSIS — R350 Frequency of micturition: Secondary | ICD-10-CM | POA: Diagnosis not present

## 2023-07-05 DIAGNOSIS — D6869 Other thrombophilia: Secondary | ICD-10-CM | POA: Diagnosis not present

## 2023-07-05 LAB — POC URINALSYSI DIPSTICK (AUTOMATED)
Bilirubin, UA: NEGATIVE
Blood, UA: 200
Glucose, UA: NEGATIVE
Ketones, UA: NEGATIVE
Leukocytes, UA: NEGATIVE
Nitrite, UA: NEGATIVE
Protein, UA: POSITIVE — AB
Spec Grav, UA: 1.025 (ref 1.010–1.025)
Urobilinogen, UA: 0.2 E.U./dL
pH, UA: 6 (ref 5.0–8.0)

## 2023-07-05 MED ORDER — CEPHALEXIN 500 MG PO CAPS
500.0000 mg | ORAL_CAPSULE | Freq: Two times a day (BID) | ORAL | 0 refills | Status: DC
Start: 2023-07-05 — End: 2023-07-27

## 2023-07-05 NOTE — Progress Notes (Signed)
   Acute Office Visit  Subjective:     Patient ID: Todd Gonzales, male    DOB: 11/11/1941, 82 y.o.   MRN: [id number]  Chief Complaint  Patient presents with   Urinary Tract Infection    Pt states he thinks its a bladder infection. Feels like lasix is "wearing his bladder out" started 3-4 days ago.  Slight abdominal pain. Slight discomfort when urinating.      Patient is in today for urinary tract infection    States that he has been taking lasix and has been "wearing and tearing" on the bladder. States that he has frequency with the fluid pill. 3-4 days ago it started. States that he did have some lower abdominal pain in the stomach. States that he did have nausea 3-4 days ago.    Review of Systems  Constitutional:  Negative for chills and fever.  Respiratory:  Negative for shortness of breath.   Cardiovascular:  Negative for chest pain.  Gastrointestinal:  Positive for abdominal pain and nausea. Negative for constipation, diarrhea and vomiting.  Genitourinary:  Positive for frequency. Negative for dysuria and hematuria.  Neurological:  Negative for headaches.  Psychiatric/Behavioral:  Negative for hallucinations and suicidal ideas.         Objective:    BP 132/80   Pulse 62   Temp 97.7 F (36.5 C) (Temporal)   Ht 5\' 10"  (1.778 m)   Wt 248 lb 12.8 oz (112.9 kg)   SpO2 92%   BMI 35.70 kg/m    Physical Exam Vitals and nursing note reviewed.  Constitutional:      Appearance: Normal appearance.  Cardiovascular:     Rate and Rhythm: Normal rate and regular rhythm.     Heart sounds: Normal heart sounds.  Pulmonary:     Effort: Pulmonary effort is normal.     Breath sounds: Normal breath sounds.  Abdominal:     General: Bowel sounds are normal. There is no distension.     Palpations: There is no mass.     Tenderness: There is no abdominal tenderness. There is no right CVA tenderness or left CVA tenderness.     Hernia: No hernia is present.  Neurological:      Mental Status: He is alert.     No results found for any visits on 07/05/23.      Assessment & Plan:   Problem List Items Addressed This Visit       Other   Urinary frequency - Primary    UA pending.  Patient unable to give sample in office sent home with a kit and instructions.  Patient could have multifactorial involvement inclusive of using a diuretic in setting of atrial fibrillation, BPH, or urinary tract infection.  Pending urinary sample results.  Exam benign in office.  No red flags.  We discussed signs and symptoms when to be seen urgent or emergently in the interim      Relevant Orders   POCT Urinalysis Dipstick (Automated)    No orders of the defined types were placed in this encounter.   Return if symptoms worsen or fail to improve.  Audria Nine, NP

## 2023-07-05 NOTE — Telephone Encounter (Signed)
Left detailed voicemail PER FYI* relayed information per message and gave call back number if any questions or concerns.

## 2023-07-05 NOTE — Addendum Note (Signed)
Addended by: Alvina Chou on: 07/05/2023 02:05 PM   Modules accepted: Orders

## 2023-07-05 NOTE — Telephone Encounter (Signed)
Can we let patient know that based off his urine that I have sent some antibiotics to the pharmacy. I will be in touch once I have the urine culture

## 2023-07-05 NOTE — Addendum Note (Signed)
Addended by: Eden Emms on: 07/05/2023 01:40 PM   Modules accepted: Orders

## 2023-07-05 NOTE — Telephone Encounter (Signed)
Pt called back, told pt [facility]'s response. Pt had no questions/concerns. Call back # [phone]

## 2023-07-05 NOTE — Assessment & Plan Note (Signed)
UA pending.  Patient unable to give sample in office sent home with a kit and instructions.  Patient could have multifactorial involvement inclusive of using a diuretic in setting of atrial fibrillation, BPH, or urinary tract infection.  Pending urinary sample results.  Exam benign in office.  No red flags.  We discussed signs and symptoms when to be seen urgent or emergently in the interim

## 2023-07-05 NOTE — Patient Instructions (Signed)
Nice to see you today  I will be in touch with the urine results once I have them Follow up if no improvement

## 2023-07-07 DIAGNOSIS — I4891 Unspecified atrial fibrillation: Secondary | ICD-10-CM

## 2023-07-13 ENCOUNTER — Ambulatory Visit (INDEPENDENT_AMBULATORY_CARE_PROVIDER_SITE_OTHER): Payer: Medicare Other | Admitting: Nurse Practitioner

## 2023-07-13 ENCOUNTER — Encounter: Payer: Self-pay | Admitting: Nurse Practitioner

## 2023-07-13 VITALS — BP 110/80 | HR 65 | Temp 98.3°F | Ht 70.0 in | Wt 246.4 lb

## 2023-07-13 DIAGNOSIS — M79604 Pain in right leg: Secondary | ICD-10-CM | POA: Diagnosis not present

## 2023-07-13 DIAGNOSIS — R600 Localized edema: Secondary | ICD-10-CM | POA: Diagnosis not present

## 2023-07-13 LAB — BASIC METABOLIC PANEL
BUN: 17 mg/dL (ref 6–23)
CO2: 33 mEq/L — ABNORMAL HIGH (ref 19–32)
Calcium: 9 mg/dL (ref 8.4–10.5)
Chloride: 104 mEq/L (ref 96–112)
Creatinine, Ser: 0.95 mg/dL (ref 0.40–1.50)
GFR: 73.52 mL/min (ref >60.00)
Glucose, Bld: 95 mg/dL (ref 70–99)
Potassium: 4.3 mEq/L (ref 3.5–5.1)
Sodium: 144 mEq/L (ref 135–145)

## 2023-07-13 LAB — BRAIN NATRIURETIC PEPTIDE: Pro B Natriuretic peptide (BNP): 131 pg/mL — ABNORMAL HIGH (ref 0.0–100.0)

## 2023-07-13 NOTE — Patient Instructions (Signed)
Nice to see you today I will be in touch with the labs once I have them Follow up if no improvement  I would take half a fluid pill for the next 3-4 days

## 2023-07-13 NOTE — Assessment & Plan Note (Signed)
Better today.  Patient is been using topical and over-the-counter oral analgesics with relief.  Do think what patient was experiencing was some sciatica from his osteoarthritis in the hip and back.  Straight leg raise negative in office today using over-the-counter oral analgesics as needed

## 2023-07-13 NOTE — Assessment & Plan Note (Signed)
History of the same.  Did review echocardiogram from May 2024 in the hospital with normal EF patient does have Lasix to take as needed at home encourage patient to take 10 mg of Lasix daily for the next 3 to 4 days.  Patient would like his potassium and electrolytes rechecked as he has been using Lasix intermittently as of late.  Pending to be bmet

## 2023-07-13 NOTE — Progress Notes (Signed)
Acute Office Visit  Subjective:     Patient ID: Todd Gonzales, male    DOB: 1941/01/19, 82 y.o.   MRN: [id number]  Chief Complaint  Patient presents with   Leg Swelling    Pt complains of r leg swollen that started a week ago. Pain level 5.     HPI Patient is in today for leg swelling   Started about a week ago. States that they both were swollen. States that the right is worse than the left. States no injury  States that he did some heat therapy, compression pump, elevation and reducing salt intake  States that he takes 20mg  of lasix every other day to every 3 days. states that he did have some pain. States that he was having pian int he back down all the way to ankle. That was intermittent. States that he uses a topical rub and tylenol that did help.  States it feels better today.  He does have Lasix at home that he will take PR and.  Written for half a tablet patient takes a whole tablet has been 2 days since he last taken the medication.   Review of Systems  Constitutional:  Negative for chills and fever.  Respiratory:  Positive for shortness of breath (baseline).   Cardiovascular:  Negative for chest pain.  Neurological:  Negative for dizziness and headaches.        Objective:    BP 110/80   Pulse 65   Temp 98.3 F (36.8 C) (Temporal)   Ht 5\' 10"  (1.778 m)   Wt 246 lb 6.4 oz (111.8 kg)   SpO2 94%   BMI 35.35 kg/m    Physical Exam Vitals and nursing note reviewed.  Constitutional:      Appearance: Normal appearance.  Cardiovascular:     Rate and Rhythm: Normal rate and regular rhythm.     Heart sounds: Normal heart sounds.  Pulmonary:     Effort: Pulmonary effort is normal.     Breath sounds: Normal breath sounds.  Musculoskeletal:     Lumbar back: Negative right straight leg raise test and negative left straight leg raise test.     Right lower leg: Edema (1+ pitting) present.     Left lower leg: Edema (trace pitting) present.     Comments: 40cm  bilateral legs   Neurological:     Mental Status: He is alert.     No results found for any visits on 07/13/23.      Assessment & Plan:   Problem List Items Addressed This Visit       Other   Lower extremity edema    History of the same.  Did review echocardiogram from May 2024 in the hospital with normal EF patient does have Lasix to take as needed at home encourage patient to take 10 mg of Lasix daily for the next 3 to 4 days.  Patient would like his potassium and electrolytes rechecked as he has been using Lasix intermittently as of late.  Pending to be bmet      Relevant Orders   Brain natriuretic peptide   Basic metabolic panel   Right leg pain - Primary    Better today.  Patient is been using topical and over-the-counter oral analgesics with relief.  Do think what patient was experiencing was some sciatica from his osteoarthritis in the hip and back.  Straight leg raise negative in office today using over-the-counter oral analgesics as needed  Relevant Orders   Brain natriuretic peptide   Basic metabolic panel    No orders of the defined types were placed in this encounter.   Return if symptoms worsen or fail to improve.  Audria Nine, NP

## 2023-07-18 ENCOUNTER — Other Ambulatory Visit: Payer: Self-pay | Admitting: Family Medicine

## 2023-07-20 ENCOUNTER — Encounter (INDEPENDENT_AMBULATORY_CARE_PROVIDER_SITE_OTHER): Payer: Self-pay

## 2023-07-25 ENCOUNTER — Ambulatory Visit (INDEPENDENT_AMBULATORY_CARE_PROVIDER_SITE_OTHER): Payer: Medicare Other

## 2023-07-25 DIAGNOSIS — E538 Deficiency of other specified B group vitamins: Secondary | ICD-10-CM | POA: Diagnosis not present

## 2023-07-25 MED ORDER — CYANOCOBALAMIN 1000 MCG/ML IJ SOLN
1000.0000 ug | Freq: Once | INTRAMUSCULAR | Status: AC
Start: 2023-07-25 — End: 2023-07-25
  Administered 2023-07-25: 1000 ug via INTRAMUSCULAR

## 2023-07-25 NOTE — Progress Notes (Addendum)
Todd Gonzales T. Todd Lindenberger, MD, CAQ Sports Medicine [facility] at [facility] [address] [place], [address]  Phone: [phone]  FAX: [phone]  ABDULKARIM Gonzales - 82 y.o. male  MRN [id number]  Date of Birth: [birth year]  Date: 07/27/2023  PCP: Todd Nam, MD  Referral: Todd Nam, MD  Chief Complaint  Patient presents with   Knee Pain    Bilateral injections    Procedure only  Aspiration/Injection Procedure Note Todd Gonzales Oct 22, 1941 Date of procedure: 07/27/2023  Procedure: Large Joint Joint Aspiration / Injection of the Right Knee Indications: Pain  Procedure Details Patient verbally consented to procedure. Risks, benefits, and alternatives explained. Sterilely prepped with Chloraprep. Ethyl cholride used for anesthesia. 9 cc Lidocaine 1% mixed with 1 mL Kenalog 40 mg injected using the anteromedial approach without difficulty. No complications with procedure and tolerated well. Patient had decreased pain post-injection.  Medication: 1 mL of Kenalog 40 mg  Aspiration/Injection Procedure Note Todd Gonzales February 20, 1941 Date of procedure: 07/27/2023  Procedure: Large Joint Aspiration / Injection of the Left Knee Indications: Pain  Procedure Details Patient verbally consented to procedure. Risks, benefits, and alternatives explained. Sterilely prepped with Chloraprep. Ethyl cholride used for anesthesia. 9 cc Lidocaine 1% mixed with 1 mL Kenalog 40 mg injected using the anteromedial approach without difficulty. No complications with procedure and tolerated well. Patient had decreased pain post-injection.  Medication: 1 mL of Kenalog 40 mg     ICD-10-CM   1. Primary osteoarthritis of knees, bilateral  M17.0 triamcinolone acetonide (KENALOG-40) injection 40 mg    triamcinolone acetonide (KENALOG-40) injection 40 mg     Todd Gonzales wants to check out his Viscosupplementation options - he has used Synvisc in the past at [employer].   He has had an extended relief of arthritic symptoms for > 6 months after prior Synvisc injections.  He has had multiple previous Synvisc injections.   We will plan on doing repeat injections bilaterally with Synvisc-One.  Medication Management during today's office visit: Meds ordered this encounter  Medications   triamcinolone acetonide (KENALOG-40) injection 40 mg   triamcinolone acetonide (KENALOG-40) injection 40 mg    Signed,  Todd Lybarger T. Everlee Quakenbush, MD   Outpatient Encounter Medications as of 07/27/2023  Medication Sig   acetaminophen (TYLENOL) 650 MG CR tablet Take 1,300 mg by mouth 2 (two) times daily as needed for pain.   Camphor-Menthol-Methyl Sal (SALONPAS EX) Apply 1 patch topically daily as needed (pain).   Cholecalciferol (VITAMIN D-3 PO) Take 1 capsule by mouth daily.   cyanocobalamin (VITAMIN B12) 1000 MCG/ML injection Inject 1 mL (1,000 mcg total) into the muscle every 30 (thirty) days.   diclofenac Sodium (VOLTAREN) 1 % GEL Apply 1 Application topically every 6 (six) hours as needed (pain).   ELIQUIS 5 MG TABS tablet TAKE 1 TABLET BY MOUTH TWICE A DAY   furosemide (LASIX) 20 MG tablet Take 0.5 tablets (10 mg total) by mouth daily as needed.   gabapentin (NEURONTIN) 300 MG capsule TAKE ONE CAPSULE BY MOUTH ONCE DAILY   hydrocortisone (PROCTOSOL HC) 2.5 % rectal cream Place 1 application. rectally 2 (two) times daily.   MAGNESIUM OXIDE PO Take 1 tablet by mouth daily.   mometasone (ELOCON) 0.1 % cream Spot treat thicker areas on lower legs once to twice daily until improved. Avoid face, groin, axilla.   ondansetron (ZOFRAN) 4 MG tablet Take 1 tablet (4 mg total) by mouth every 8 (eight) hours as needed for  nausea or vomiting.   OVER THE COUNTER MEDICATION Place 1-2 sprays into the nose daily as needed (allergies, nasal congestion). Unknown nasal spray.   polyethylene glycol (MIRALAX / GLYCOLAX) 17 g packet Take 17 g by mouth daily as needed for mild constipation.    tamsulosin (FLOMAX) 0.4 MG CAPS capsule TAKE TWO CAPSULE BY MOUTH DAILY   traMADol (ULTRAM) 50 MG tablet Take 0.5-1 tablets (25-50 mg total) by mouth every 8 (eight) hours as needed.   metoprolol tartrate (LOPRESSOR) 25 MG tablet Take 1 tablet (25 mg total) by mouth 2 (two) times daily.   [DISCONTINUED] cephALEXin (KEFLEX) 500 MG capsule Take 1 capsule (500 mg total) by mouth 2 (two) times daily.   [EXPIRED] triamcinolone acetonide (KENALOG-40) injection 40 mg    [EXPIRED] triamcinolone acetonide (KENALOG-40) injection 40 mg    No facility-administered encounter medications on file as of 07/27/2023.

## 2023-07-25 NOTE — Progress Notes (Signed)
Per orders of Dr. Para March, injection of monthly B12 1000 mcg/ml given by Eual Fines, CMA in Left Deltoid. Patient tolerated injection well.  Advised him to schedule for next month and get his flu shot at the same time.

## 2023-07-27 ENCOUNTER — Ambulatory Visit (INDEPENDENT_AMBULATORY_CARE_PROVIDER_SITE_OTHER): Payer: Medicare Other | Admitting: Family Medicine

## 2023-07-27 ENCOUNTER — Encounter: Payer: Self-pay | Admitting: Family Medicine

## 2023-07-27 VITALS — BP 162/80 | HR 65 | Temp 98.1°F | Ht 70.0 in | Wt 257.5 lb

## 2023-07-27 DIAGNOSIS — M170 Bilateral primary osteoarthritis of knee: Secondary | ICD-10-CM

## 2023-07-27 MED ORDER — TRIAMCINOLONE ACETONIDE 40 MG/ML IJ SUSP
40.0000 mg | Freq: Once | INTRAMUSCULAR | Status: AC
Start: 2023-07-27 — End: 2023-07-27
  Administered 2023-07-27: 40 mg via INTRA_ARTICULAR

## 2023-07-28 ENCOUNTER — Encounter: Payer: Self-pay | Admitting: Family Medicine

## 2023-07-28 ENCOUNTER — Ambulatory Visit (INDEPENDENT_AMBULATORY_CARE_PROVIDER_SITE_OTHER)
Admission: RE | Admit: 2023-07-28 | Discharge: 2023-07-28 | Disposition: A | Discharge status: Home / Self Care | Payer: Medicare Other | Source: Ambulatory Visit | Attending: Family Medicine | Admitting: Family Medicine

## 2023-07-28 ENCOUNTER — Ambulatory Visit: Payer: Medicare Other | Admitting: Family Medicine

## 2023-07-28 VITALS — BP 126/64 | HR 81 | Temp 97.8°F | Ht 70.0 in | Wt 257.1 lb

## 2023-07-28 DIAGNOSIS — R0602 Shortness of breath: Secondary | ICD-10-CM

## 2023-07-28 DIAGNOSIS — R918 Other nonspecific abnormal finding of lung field: Secondary | ICD-10-CM | POA: Diagnosis not present

## 2023-07-28 NOTE — Progress Notes (Unsigned)
Subjective:    Patient ID: Todd Gonzales, male    DOB: 08/02/1941, 82 y.o.   MRN: [id number]  HPI  Wt Readings from Last 3 Encounters:  07/28/23 257 lb 2 oz (116.6 kg)  07/27/23 257 lb 8 oz (116.8 kg)  07/13/23 246 lb 6.4 oz (111.8 kg)   36.89 kg/m  Vitals:   07/28/23 1608 07/28/23 1632  BP: 126/64   Pulse: 81   Temp: 97.8 F (36.6 C)   SpO2: 91% 94%    Pt presents for leg swelling  3-4 tdays ago  Worse in right leg  Shortness of breath on exertion  Taking lasix 10 mg  Ten lb weight gain in 2 days   History of a fib  Sees cardiology   Last visit with cardiology D/c amiodarone  Zio monitor given  On eliquis  Lasix   Refused to go to ED Dr Para March advised increase lasix to 20 mg today but has not had a chance to take the extra yet   Did not take his lasix today   Echo 04/2023 Mild LVH Normal EF  Aortic sclerosis w/o stenosis   Cardiologist is Dr Herbie Baltimore Next appt is Dr Juventino Slovak has to sit up in middle of the night to    Patient Active Problem List   Diagnosis Date Noted   Right leg pain 07/13/2023   Atrial fibrillation (HCC) 04/15/2023   PVC (premature ventricular contraction) 04/14/2023   Atrial flutter (HCC) 04/14/2023   Irregular heartbeat 04/14/2023   Atrial fibrillation with RVR (HCC) 04/14/2023   Coronary artery disease 03/26/2023   B12 deficiency 12/28/2022   SOB (shortness of breath) on exertion 12/26/2022   Lower extremity edema 12/26/2022   Upper respiratory tract infection 12/07/2022   Knee pain 08/29/2022   History of prostatitis 08/19/2022   Constipation 05/08/2022   Dysuria 04/13/2022   Urinary frequency 04/05/2022   Abnormal urinalysis 04/05/2022   Balance problem 02/16/2022   Thumb pain 03/31/2020   Muscle cramp 03/31/2020   Cervical radiculopathy 12/13/2019   Right arm pain 10/10/2019   Vitamin D deficiency 10/10/2019   Incontinence of feces    Post-op pain    Hyperglycemia    Status post laminectomy     Postoperative pain after spinal surgery 11/30/2018   Myelopathy concurrent with and due to stenosis of lumbar spine (HCC) 11/20/2018   Microalbuminuria 09/02/2016   HLD (hyperlipidemia) 09/02/2016   Spinal stenosis    Other fatigue 08/24/2016   HTN (hypertension) 08/24/2016   BPH (benign prostatic hyperplasia) 08/24/2016   Nocturia 08/24/2016   Advance care planning 08/24/2016   Osteoarthritis 10/28/2014   SCC (squamous cell carcinoma), face 09/25/2014   S/P Left THA, AA 03/20/2012   Past Medical History:  Diagnosis Date   Actinic keratosis 03/28/2023   glabella, needs LN2   Arthritis    Basal cell carcinoma 03/28/2023   right paranasal, needs mohs   BPH (benign prostatic hyperplasia)    nocturia x4 at baseline   H/O hiatal hernia    Hemorrhoids    Hyperlipidemia    Hypertension    Dr Wylene Simmer- LOV with clearance 3/13 on chart   IGT (impaired glucose tolerance)    last AIC 5.5- diet controlled- per office note 3/13   Paroxysmal atrial fibrillation (HCC) 04/14/2023   Admitted with A-fib RVR.  Normal echo.  Discharged on amiodarone.   SCC (squamous cell carcinoma) 02/10/2005   scc in situ-left forearm.   SCCA (squamous cell carcinoma)  of skin 02/10/2005   Right Outer Eye (in situ) (curet and excision)   SCCA (squamous cell carcinoma) of skin 02/13/2006   Right Ear (well diff) (curet and 5FU)   SCCA (squamous cell carcinoma) of skin 04/05/2006   Left Ear Bowl (in situ) (tx p bx)   SCCA (squamous cell carcinoma) of skin 07/06/2010   Right Cheek (in situ) (curet and zyclara)   SCCA (squamous cell carcinoma) of skin 04/27/2009   Left Upper Forearm (in situ) (curet 5FU)   SCCA (squamous cell carcinoma) of skin 08/28/2014   Right Cheek (well diff)   SCCA (squamous cell carcinoma) of skin 03/21/2016   Left Jawline Ant (well diff) (curet and 5FU)   SCCA (squamous cell carcinoma) of skin 01/19/2017   Right Hand (in situ) (tx p bx)   SCCA (squamous cell carcinoma) of skin  03/04/1840   Left Jawline Inf (in situ) (curet and 5FU)   SCCA (squamous cell carcinoma) of skin 12/23/2020   Left Forehead (well diff) (tx p bx)   SCCA (squamous cell carcinoma) of skin 12/23/2020   Left Submandibular Area (in situ) (tx p bx)   Seasonal allergies    Spinal stenosis    Squamous cell carcinoma of skin 08/31/2020   in situ-left jawline cheek   Squamous cell carcinoma of skin 04/27/2021   infil- left nasal sidewall(MOHS)   Squamous cell carcinoma of skin 03/28/2023   Left medial cheek above nasolabial, needs mohs   Squamous cell carcinoma of skin of face 04/27/2021   infil- Left Temporal scalp (MOHS   UTI (lower urinary tract infection)    required admission to [facility]   Past Surgical History:  Procedure Laterality Date   BACK SURGERY  2008   Laminectomy L4-5   JOINT REPLACEMENT Bilateral    bil   KNEE ARTHROSCOPY     right   LUMBAR LAMINECTOMY/DECOMPRESSION MICRODISCECTOMY Bilateral 11/20/2018   Procedure: Bilateral Thoracic Twelve to Lumbar One Laminectomy;  Surgeon: Barnett Abu, MD;  Location: [facility] OR;  Service: Neurosurgery;  Laterality: Bilateral;  posterior   TOTAL HIP ARTHROPLASTY  03/20/2012   Procedure: TOTAL HIP ARTHROPLASTY ANTERIOR APPROACH;  Surgeon: Shelda Pal, MD;  Location: [facility] ORS;  Service: Orthopedics;  Laterality: Left;   Social History   Tobacco Use   Smoking status: Never   Smokeless tobacco: Never  Vaping Use   Vaping status: Never Used  Substance Use Topics   Alcohol use: Yes    Comment: socially, wine   Drug use: No   Family History  Problem Relation Age of Onset   Arthritis Mother    Lung cancer Father    Asthma Father    Colon cancer Neg Hx    Prostate cancer Neg Hx    Allergies  Allergen Reactions   Codeine Other (See Comments)    Unknown reaction   Lipitor [Atorvastatin] Other (See Comments)    Arthralgias    Bactrim [Sulfamethoxazole-Trimethoprim] Rash   Current Outpatient Medications on File Prior to Visit   Medication Sig Dispense Refill   acetaminophen (TYLENOL) 650 MG CR tablet Take 1,300 mg by mouth 2 (two) times daily as needed for pain.     Camphor-Menthol-Methyl Sal (SALONPAS EX) Apply 1 patch topically daily as needed (pain).     Cholecalciferol (VITAMIN D-3 PO) Take 1 capsule by mouth daily.     cyanocobalamin (VITAMIN B12) 1000 MCG/ML injection Inject 1 mL (1,000 mcg total) into the muscle every 30 (thirty) days.     diclofenac Sodium (  VOLTAREN) 1 % GEL Apply 1 Application topically every 6 (six) hours as needed (pain).     ELIQUIS 5 MG TABS tablet TAKE 1 TABLET BY MOUTH TWICE A DAY 60 tablet 2   furosemide (LASIX) 20 MG tablet Take 0.5 tablets (10 mg total) by mouth daily as needed. 45 tablet 3   gabapentin (NEURONTIN) 300 MG capsule TAKE ONE CAPSULE BY MOUTH ONCE DAILY 90 capsule 1   hydrocortisone (PROCTOSOL HC) 2.5 % rectal cream Place 1 application. rectally 2 (two) times daily. 30 g 1   MAGNESIUM OXIDE PO Take 1 tablet by mouth daily.     mometasone (ELOCON) 0.1 % cream Spot treat thicker areas on lower legs once to twice daily until improved. Avoid face, groin, axilla. 45 g 1   ondansetron (ZOFRAN) 4 MG tablet Take 1 tablet (4 mg total) by mouth every 8 (eight) hours as needed for nausea or vomiting. 20 tablet 0   OVER THE COUNTER MEDICATION Place 1-2 sprays into the nose daily as needed (allergies, nasal congestion). Unknown nasal spray.     polyethylene glycol (MIRALAX / GLYCOLAX) 17 g packet Take 17 g by mouth daily as needed for mild constipation. 14 each 0   tamsulosin (FLOMAX) 0.4 MG CAPS capsule TAKE TWO CAPSULE BY MOUTH DAILY 60 capsule 3   traMADol (ULTRAM) 50 MG tablet Take 0.5-1 tablets (25-50 mg total) by mouth every 8 (eight) hours as needed. 30 tablet 1   metoprolol tartrate (LOPRESSOR) 25 MG tablet Take 1 tablet (25 mg total) by mouth 2 (two) times daily. 60 tablet 3   No current facility-administered medications on file prior to visit.    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

## 2023-07-28 NOTE — Telephone Encounter (Signed)
I spoke with Todd Gonzales (DPR signed) pt saw [name] on 07/13/23 and pt having swelling in both lower legs with swelling worse in rt leg. Pt has SOB upon any exertion.  Since 07/13/23 pt has been taking lasix 10 mg daily without any improvement in swelling.  Swelling does not go down overnight. Pt does not elevate legs when sitting. Pt has not increased salt intake. NO CP. Not sure if abd is swelling. Pt told wife abd feels tight. Pt has had 10 lb weight gain in 2 days.Pt's wife said pt has refused to go to ED. I spoke with Dr Para March PCP and he said to give pt Lasix 20 mg now and take Lasix 20 mg next 2 days and cb on Mon with update. Dr Para March said since pt will not go to ED for pt to see [name] today at 4 PM. And if SOB worsens for pt to go to ED. Pts wife voiced understanding.sending note to [name] and Dr Para March as PCP.

## 2023-07-28 NOTE — Telephone Encounter (Signed)
Noted. Thanks. See OV note.  

## 2023-07-28 NOTE — Patient Instructions (Addendum)
Go up on lasix to 20 mg daily    Take it immediately when you get home   If you get get worse tonight you have to go to the ER  You will see xray results on mychart     Call cardiology to let them know what is going on

## 2023-07-28 NOTE — Assessment & Plan Note (Signed)
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

## 2023-07-31 ENCOUNTER — Encounter: Payer: Self-pay | Admitting: Cardiology

## 2023-07-31 NOTE — Telephone Encounter (Signed)
Sounds like Dr. Milinda Antis got it right.  This seems new compared to last visit -- probably worth having him come in to be seen by APP just to get the full story & add any potential tests.  DH

## 2023-08-01 ENCOUNTER — Ambulatory Visit: Payer: Medicare Other | Admitting: Family Medicine

## 2023-08-01 ENCOUNTER — Telehealth: Payer: Self-pay | Admitting: Cardiology

## 2023-08-01 NOTE — Telephone Encounter (Signed)
Will forward to scheduling.    Patient is requesting call back to get a sooner appt with APP than what is available currently per MyChart message. Please advise.

## 2023-08-01 NOTE — Telephone Encounter (Signed)
Patient is requesting call back to get a sooner appt with APP than what is available currently per MyChart message. Please advise.

## 2023-08-02 NOTE — Telephone Encounter (Signed)
Called left message on wife's voicemail - calling to set an appountment

## 2023-08-04 ENCOUNTER — Encounter: Payer: Self-pay | Admitting: Nurse Practitioner

## 2023-08-04 ENCOUNTER — Ambulatory Visit: Payer: Medicare Other | Attending: Nurse Practitioner | Admitting: Nurse Practitioner

## 2023-08-04 VITALS — BP 136/78 | HR 59 | Ht 70.0 in | Wt 243.6 lb

## 2023-08-04 DIAGNOSIS — I5033 Acute on chronic diastolic (congestive) heart failure: Secondary | ICD-10-CM

## 2023-08-04 DIAGNOSIS — I10 Essential (primary) hypertension: Secondary | ICD-10-CM

## 2023-08-04 DIAGNOSIS — I4891 Unspecified atrial fibrillation: Secondary | ICD-10-CM

## 2023-08-04 DIAGNOSIS — I480 Paroxysmal atrial fibrillation: Secondary | ICD-10-CM | POA: Diagnosis not present

## 2023-08-04 DIAGNOSIS — R0609 Other forms of dyspnea: Secondary | ICD-10-CM

## 2023-08-04 MED ORDER — FUROSEMIDE 20 MG PO TABS: 20.0000 mg | ORAL_TABLET | Freq: Every day | ORAL | Status: DC

## 2023-08-04 NOTE — Patient Instructions (Signed)
Medication Instructions:   Your physician recommends that you continue on your current medications as directed. Please refer to the Current Medication list given to you today.  *If you need a refill on your cardiac medications before your next appointment, please call your pharmacy*   Lab Work:  Your physician recommends you have labs drawn today - BMP   If you have labs (blood work) drawn today and your tests are completely normal, you will receive your results only by: MyChart Message (if you have MyChart) OR A paper copy in the mail If you have any lab test that is abnormal or we need to change your treatment, we will call you to review the results.   Testing/Procedures:    Your cardiac CT will be scheduled at one of the below locations:   [facility] [address] [place], [place] [address] [phone]  OR   [facility] [address] [place], [place] [address] [phone]  If scheduled at [facility] or [facility], please arrive 15 mins early for check-in and test prep.  There is spacious parking and easy access to the radiology department from the [facility] entrance. Please enter here and check-in with the desk attendant.   Please follow these instructions carefully (unless otherwise directed):  An IV will be required for this test and Nitroglycerin will be given.  Hold all erectile dysfunction medications at least 3 days (72 hrs) prior to test. (Ie viagra, cialis, sildenafil, tadalafil, etc)   On the Night Before the Test: Be sure to Drink plenty of water. Do not consume any caffeinated/decaffeinated beverages or chocolate 12 hours prior to your test. Do not take any antihistamines 12 hours prior to your test.   On the Day of the Test: Drink plenty of water until 1 hour prior to the test. Do not eat any food 1 hour prior to  test. You may take your regular medications prior to the test.  If you take Furosemide/Hydrochlorothiazide/Spironolactone, please HOLD on the morning of the test.      After the Test: Drink plenty of water. After receiving IV contrast, you may experience a mild flushed feeling. This is normal. On occasion, you may experience a mild rash up to 24 hours after the test. This is not dangerous. If this occurs, you can take Benadryl 25 mg and increase your fluid intake. If you experience trouble breathing, this can be serious. If it is severe call 911 IMMEDIATELY. If it is mild, please call our office. If you take any of these medications: Glipizide/Metformin, Avandament, Glucavance, please do not take 48 hours after completing test unless otherwise instructed.  We will call to schedule your test 2-4 weeks out understanding that some insurance companies will need an authorization prior to the service being performed.   For more information and frequently asked questions, please visit our website : [email or web address]  For non-scheduling related questions, please contact the cardiac imaging nurse navigator should you have any questions/concerns: Cardiac Imaging Nurse Navigators Direct Office Dial: [phone]   For scheduling needs, including cancellations and rescheduling, please call [place], [phone].    Follow-Up: At [facility], you and your health needs are our priority.  As part of our continuing mission to provide you with exceptional heart care, we have created designated Provider Care Teams.  These Care Teams include your primary Cardiologist (physician) and Advanced Practice Providers (APPs -  Physician Assistants  and Nurse Practitioners) who all work together to provide you with the care you need, when you need it.  We recommend signing up for the patient portal called "MyChart".  Sign up information is provided on this After Visit Summary.  MyChart is  used to connect with patients for Virtual Visits (Telemedicine).  Patients are able to view lab/test results, encounter notes, upcoming appointments, etc.  Non-urgent messages can be sent to your provider as well.   To learn more about what you can do with MyChart, go to [email or web address].    Your next appointment:    After testing   Provider:   Bryan Lemma, MD or Nicolasa Ducking, NP

## 2023-08-04 NOTE — Progress Notes (Signed)
Office Visit    Patient Name: Todd Gonzales Date of Encounter: 08/04/2023  Primary Care Provider:  Joaquim Nam, MD Primary Cardiologist:  Bryan Lemma, MD  Chief Complaint    82 y.o. male with a history of paroxysmal atrial fibrillation, hypertension, hyperlipidemia, and reported h/o coronary artery disease, who presents for follow-up related to recent lower extremity edema and dyspnea.  Past Medical History    Past Medical History:  Diagnosis Date   Actinic keratosis 03/28/2023   glabella, needs LN2   Aortic atherosclerosis (HCC)    a. Noted on CT 02/2023.   Arthritis    Basal cell carcinoma 03/28/2023   right paranasal, needs mohs   BPH (benign prostatic hyperplasia)    nocturia x4 at baseline   Coronary artery calcification seen on CT scan    H/O hiatal hernia    Hemorrhoids    History of echocardiogram    a. 04/2023 Echo: EF 60-65%, no rwma, mild LVH, nl RV fxn, triv MR.   Hyperlipidemia    Hypertension    Dr Wylene Simmer- LOV with clearance 3/13 on chart   IGT (impaired glucose tolerance)    last AIC 5.5- diet controlled- per office note 3/13   Paroxysmal atrial fibrillation (HCC) 04/14/2023   a. 04/2023 Admitted with A-fib RVR.  Normal echo.  Discharged on amiodarone; b. 05/2023 Zio: Predominantly sinus rhythm at 66 (50-88).  First-degree AV block.  4 beats of NSVT.  No sustained arrhythmias.  Amiodarone discontinued.   SCC (squamous cell carcinoma) 02/10/2005   scc in situ-left forearm.   SCCA (squamous cell carcinoma) of skin 02/10/2005   Right Outer Eye (in situ) (curet and excision)   SCCA (squamous cell carcinoma) of skin 02/13/2006   Right Ear (well diff) (curet and 5FU)   SCCA (squamous cell carcinoma) of skin 04/05/2006   Left Ear Bowl (in situ) (tx p bx)   SCCA (squamous cell carcinoma) of skin 07/06/2010   Right Cheek (in situ) (curet and zyclara)   SCCA (squamous cell carcinoma) of skin 04/27/2009   Left Upper Forearm (in situ) (curet 5FU)   SCCA  (squamous cell carcinoma) of skin 08/28/2014   Right Cheek (well diff)   SCCA (squamous cell carcinoma) of skin 03/21/2016   Left Jawline Ant (well diff) (curet and 5FU)   SCCA (squamous cell carcinoma) of skin 01/19/2017   Right Hand (in situ) (tx p bx)   SCCA (squamous cell carcinoma) of skin 03/04/1840   Left Jawline Inf (in situ) (curet and 5FU)   SCCA (squamous cell carcinoma) of skin 12/23/2020   Left Forehead (well diff) (tx p bx)   SCCA (squamous cell carcinoma) of skin 12/23/2020   Left Submandibular Area (in situ) (tx p bx)   Seasonal allergies    Spinal stenosis    Squamous cell carcinoma of skin 08/31/2020   in situ-left jawline cheek   Squamous cell carcinoma of skin 04/27/2021   infil- left nasal sidewall(MOHS)   Squamous cell carcinoma of skin 03/28/2023   Left medial cheek above nasolabial, needs mohs   Squamous cell carcinoma of skin of face 04/27/2021   infil- Left Temporal scalp (MOHS   UTI (lower urinary tract infection)    required admission to [facility]   Past Surgical History:  Procedure Laterality Date   BACK SURGERY  2008   Laminectomy L4-5   JOINT REPLACEMENT Bilateral    bil   KNEE ARTHROSCOPY     right   LUMBAR LAMINECTOMY/DECOMPRESSION MICRODISCECTOMY Bilateral  11/20/2018   Procedure: Bilateral Thoracic Twelve to Lumbar One Laminectomy;  Surgeon: Barnett Abu, MD;  Location: [facility] OR;  Service: Neurosurgery;  Laterality: Bilateral;  posterior   TOTAL HIP ARTHROPLASTY  03/20/2012   Procedure: TOTAL HIP ARTHROPLASTY ANTERIOR APPROACH;  Surgeon: Shelda Pal, MD;  Location: [facility] ORS;  Service: Orthopedics;  Laterality: Left;    Allergies  Allergies  Allergen Reactions   Codeine Other (See Comments)    Unknown reaction   Lipitor [Atorvastatin] Other (See Comments)    Arthralgias    Bactrim [Sulfamethoxazole-Trimethoprim] Rash    History of Present Illness      82 y.o. y/o male with a history of paroxysmal atrial fibrillation, hypertension,  hyperlipidemia, and reported h/o coronary artery disease.  Patient was hospitalized at [facility] in May 2024 in the setting of dyspnea and fatigue.  He was also found to be in atrial fibrillation/flutter with PVCs and rapid ventricular rate.  He was initially placed on diltiazem and subsequently amiodarone with conversion to sinus rhythm.  Echo showed normal LV function.  He was given a dose of IV Lasix.  He was discharged home on Eliquis and establish care with Dr. Herbie Baltimore in June 2024.  Amiodarone was discontinued and he was placed on metoprolol 25 mg twice daily.  Subsequent ZIO monitoring showed predominantly sinus rhythm with a first-degree AV block and an average heart rate of 66.  No sustained arrhythmias or recurrent A-fib noted.   Last week, patient noticed increasing lower extremity swelling with dyspnea on exertion and at rest.  He was seen by primary care and advised to increase Lasix to 20 mg daily.  BNP was mildly elevated at 131.  Renal function and electrolytes were stable.  Chest x-ray showed no active disease.  Following daily Lasix over the past week, his weight is down from 257 pounds to 243.  He has had improvement in lower extremity swelling and feels as though his dyspnea resolved.  His wife did call EMS out to the house on Tuesday due to an oxygen saturation in the 80s, which came up into the 90s without intervention per patient.  In discussing his swelling and medication and dietary habits, he was previous prescribed Lasix 10 mg daily but was often skipping doses.  He also likes to eat potato chips and bologna, which his wife has now taken out of the house.  He does not experience chest pain but has chronic dyspnea on exertion, which has progressed over time.  He denies palpitations, PND, orthopnea, dizziness, syncope, or early satiety.  Home Medications    Current Outpatient Medications  Medication Sig Dispense Refill   acetaminophen (TYLENOL) 650 MG CR tablet Take 1,300 mg by  mouth 2 (two) times daily as needed for pain.     Camphor-Menthol-Methyl Sal (SALONPAS EX) Apply 1 patch topically daily as needed (pain).     Cholecalciferol (VITAMIN D-3 PO) Take 1 capsule by mouth daily.     cyanocobalamin (VITAMIN B12) 1000 MCG/ML injection Inject 1 mL (1,000 mcg total) into the muscle every 30 (thirty) days.     diclofenac Sodium (VOLTAREN) 1 % GEL Apply 1 Application topically every 6 (six) hours as needed (pain).     ELIQUIS 5 MG TABS tablet TAKE 1 TABLET BY MOUTH TWICE A DAY 60 tablet 2   furosemide (LASIX) 20 MG tablet Take 0.5 tablets (10 mg total) by mouth daily as needed. 45 tablet 3   gabapentin (NEURONTIN) 300 MG capsule TAKE ONE CAPSULE BY  MOUTH ONCE DAILY 90 capsule 1   hydrocortisone (PROCTOSOL HC) 2.5 % rectal cream Place 1 application. rectally 2 (two) times daily. 30 g 1   metoprolol tartrate (LOPRESSOR) 25 MG tablet Take 1 tablet (25 mg total) by mouth 2 (two) times daily. 60 tablet 3   mometasone (ELOCON) 0.1 % cream Spot treat thicker areas on lower legs once to twice daily until improved. Avoid face, groin, axilla. 45 g 1   ondansetron (ZOFRAN) 4 MG tablet Take 1 tablet (4 mg total) by mouth every 8 (eight) hours as needed for nausea or vomiting. 20 tablet 0   OVER THE COUNTER MEDICATION Place 1-2 sprays into the nose daily as needed (allergies, nasal congestion). Unknown nasal spray.     polyethylene glycol (MIRALAX / GLYCOLAX) 17 g packet Take 17 g by mouth daily as needed for mild constipation. 14 each 0   tamsulosin (FLOMAX) 0.4 MG CAPS capsule TAKE TWO CAPSULE BY MOUTH DAILY 60 capsule 3   MAGNESIUM OXIDE PO Take 1 tablet by mouth daily. (Patient not taking: Reported on 08/04/2023)     traMADol (ULTRAM) 50 MG tablet Take 0.5-1 tablets (25-50 mg total) by mouth every 8 (eight) hours as needed. (Patient not taking: Reported on 08/04/2023) 30 tablet 1   No current facility-administered medications for this visit.     Review of Systems    Dyspnea on  exertion which has progressed over time.  No chest pain.  Mild lower extremity edema following diuresis.  Weight loss following diuresis.  Denies palpitations, PND, orthopnea, dizziness, syncope, or early satiety.  All other systems reviewed and are otherwise negative except as noted above.    Physical Exam    VS:  BP (!) 140/88 (BP Location: Left Arm, Patient Position: Sitting, Cuff Size: Normal)   Pulse (!) 59   Ht 5\' 10"  (1.778 m)   Wt 243 lb 9.6 oz (110.5 kg)   SpO2 93%   BMI 34.95 kg/m  , BMI Body mass index is 34.95 kg/m.     Vitals:   08/04/23 1047 08/04/23 1816  BP: (!) 140/88 136/78  Pulse: (!) 59   SpO2: 93%     GEN: Well nourished, well developed, in no acute distress. HEENT: normal. Neck: Supple, no JVD, carotid bruits, or masses. Cardiac: RRR, 1/6 systolic murmur at the upper sternal borders.  No rubs or gallops.. No clubbing, cyanosis, 1+ bilateral somewhat woody lower extremity edema-right greater than left.  Radials 2+/PT 2+ and equal bilaterally.  Respiratory:  Respirations regular and unlabored, clear to auscultation bilaterally. GI: Obese, semifirm and protuberant.  Nontender, BS + x 4. MS: no deformity or atrophy. Skin: warm and dry, no rash. Neuro:  Strength and sensation are intact. Psych: Normal affect.  Accessory Clinical Findings    ECG personally reviewed by me today - EKG Interpretation Date/Time:  Friday August 04 2023 10:53:45 EDT Ventricular Rate:  59 PR Interval:  200 QRS Duration:  80 QT Interval:  396 QTC Calculation: 392 R Axis:   10  Text Interpretation: Sinus bradycardia Confirmed by Nicolasa Ducking [phone]) on 08/04/2023 10:59:37 AM  - no acute changes.  Lab Results  Component Value Date   WBC 8.3 04/16/2023   HGB 12.9 (L) 04/16/2023   HCT 39.8 04/16/2023   MCV 101.5 (H) 04/16/2023   PLT 137 (L) 04/16/2023   Lab Results  Component Value Date   CREATININE 0.95 07/13/2023   BUN 17 07/13/2023   NA 144 07/13/2023   K 4.3  07/13/2023   CL 104 07/13/2023   CO2 33 (H) 07/13/2023   Lab Results  Component Value Date   ALT 37 04/15/2023   AST 24 04/15/2023   ALKPHOS 38 04/15/2023   BILITOT 1.5 (H) 04/15/2023   Lab Results  Component Value Date   CHOL 109 04/16/2023   HDL 41 04/16/2023   LDLCALC 58 04/16/2023   TRIG 51 04/16/2023   CHOLHDL 2.7 04/16/2023    Lab Results  Component Value Date   HGBA1C 5.3 12/26/2022    Assessment & Plan    1.  Acute on chronic heart failure with preserved ejection fraction: Patient seen by primary care last week in the setting of progressive dyspnea and lower extremity edema, which has since improved since taking daily Lasix 20 mg.  His BNP was mildly elevated at that time at 131.  Chest x-ray did not show any acute disease.  His weight is down 14 pounds since last week.  He still has 1+ woody lower extremity edema, which he says has been more or less his baseline since the spring.  Echo in May showed an EF of 60 to 65% with mild LVH and trivial MR.  Follow-up basic metabolic panel today.  Provided that labs are stable, we will look to continue Lasix 20 mg daily.  Heart rate and blood pressure stable on repeat vital signs.  Continue beta-blocker.  We discussed that in the setting of chronic dyspnea exertion with known coronary calcifications, and ischemic evaluation is warranted.  Will plan on coronary CT angiogram pending lab work. We discussed the importance of daily weights, sodium restriction, medication compliance, and symptom reporting and he verbalizes understanding.   2.  Coronary calcification/dyspnea on exertion: As above, question potential anginal equivalent of dyspnea on exertion.  He does not experience chest pain.  Echo earlier this year showed normal LV function.  In light of progressive dyspnea and recent volume excess, agreeable to ischemic evaluation.  Follow-up basic metabolic panel.  Plan for coronary CT angiogram.  Statin intolerant however LDL was 58 in  May.  3.  Paroxysmal atrial fibrillation: Diagnosed in May 2024 and initially managed with amiodarone, which was subsequently discontinued.  Recent monitoring did not show any recurrent atrial fibrillation.  He remains on beta-blocker and Eliquis therapy.  4.  Primary hypertension: Initially elevated on arrival though dropped to 136/78 with rest.  Pressures typically run lower at home, often in the 1 teens.  He will continue to follow at home.  Continue beta-blocker and diuretic therapy.  5.  Disposition: Follow-up basic metabolic panel.  Follow-up coronary CT angiogram.  Follow-up in clinic in 1 month or sooner if necessary.  Nicolasa Ducking, NP 08/04/2023, 11:07 AM

## 2023-08-05 LAB — BASIC METABOLIC PANEL
BUN/Creatinine Ratio: 16 (ref 10–24)
BUN: 14 mg/dL (ref 8–27)
CO2: 27 mmol/L (ref 20–29)
Calcium: 9.1 mg/dL (ref 8.6–10.2)
Chloride: 100 mmol/L (ref 96–106)
Creatinine, Ser: 0.85 mg/dL (ref 0.76–1.27)
Glucose: 89 mg/dL (ref 70–99)
Potassium: 4.3 mmol/L (ref 3.5–5.2)
Sodium: 144 mmol/L (ref 134–144)
eGFR: 86 mL/min/{1.73_m2} (ref >59)

## 2023-08-08 ENCOUNTER — Ambulatory Visit (INDEPENDENT_AMBULATORY_CARE_PROVIDER_SITE_OTHER): Payer: Medicare Other

## 2023-08-08 VITALS — Ht 70.0 in | Wt 242.0 lb

## 2023-08-08 DIAGNOSIS — Z0000 Encounter for general adult medical examination without abnormal findings: Secondary | ICD-10-CM

## 2023-08-08 DIAGNOSIS — K080 Exfoliation of teeth due to systemic causes: Secondary | ICD-10-CM | POA: Diagnosis not present

## 2023-08-08 NOTE — Progress Notes (Signed)
Subjective:   Todd Gonzales is a 82 y.o. male who presents for Medicare Annual/Subsequent preventive examination.  Visit Complete: Virtual  I connected with  Todd Gonzales on 08/08/23 by a audio enabled telemedicine application and verified that I am speaking with the correct person using two identifiers.  Patient Location: Home  Provider Location: Home Office  I discussed the limitations of evaluation and management by telemedicine. The patient expressed understanding and agreed to proceed.  Patient Medicare AWV questionnaire was completed by the patient on 08/08/23; I have confirmed that all information answered by patient is correct and no changes since this date.  Vital Signs: Because this visit was a virtual/telehealth visit, some criteria may be missing or patient reported. Any vitals not documented were not able to be obtained and vitals that have been documented are patient reported.    Review of Systems      Cardiac Risk Factors include: advanced age (>33men, >46 women);hypertension;dyslipidemia;male gender;sedentary lifestyle     Objective:    Today's Vitals   08/08/23 1531  Weight: 242 lb (109.8 kg)  Height: 5\' 10"  (1.778 m)   Body mass index is 34.72 kg/m.     08/08/2023    3:40 PM 04/14/2023    3:30 PM 08/05/2022    8:21 AM 10/26/2021    1:46 PM 11/30/2018    6:00 PM 11/30/2018   11:28 AM 11/20/2018    6:29 AM  Advanced Directives  Does Patient Have a Medical Advance Directive? Yes No No No No No   Type of Estate agent of [place];Living will        Copy of Healthcare Power of Attorney in Chart? No - copy requested    No - copy requested No - copy requested No - copy requested  Would patient like information on creating a medical advance directive?  No - Patient declined No - Patient declined No - Patient declined No - Patient declined No - Patient declined     Current Medications (verified) Outpatient Encounter Medications as of  08/08/2023  Medication Sig   acetaminophen (TYLENOL) 650 MG CR tablet Take 1,300 mg by mouth 2 (two) times daily as needed for pain.   Camphor-Menthol-Methyl Sal (SALONPAS EX) Apply 1 patch topically daily as needed (pain).   Cholecalciferol (VITAMIN D-3 PO) Take 1 capsule by mouth daily.   cyanocobalamin (VITAMIN B12) 1000 MCG/ML injection Inject 1 mL (1,000 mcg total) into the muscle every 30 (thirty) days.   diclofenac Sodium (VOLTAREN) 1 % GEL Apply 1 Application topically every 6 (six) hours as needed (pain).   ELIQUIS 5 MG TABS tablet TAKE 1 TABLET BY MOUTH TWICE A DAY   furosemide (LASIX) 20 MG tablet Take 1 tablet (20 mg total) by mouth daily.   gabapentin (NEURONTIN) 300 MG capsule TAKE ONE CAPSULE BY MOUTH ONCE DAILY   hydrocortisone (PROCTOSOL HC) 2.5 % rectal cream Place 1 application. rectally 2 (two) times daily.   metoprolol tartrate (LOPRESSOR) 25 MG tablet Take 1 tablet (25 mg total) by mouth 2 (two) times daily.   mometasone (ELOCON) 0.1 % cream Spot treat thicker areas on lower legs once to twice daily until improved. Avoid face, groin, axilla.   ondansetron (ZOFRAN) 4 MG tablet Take 1 tablet (4 mg total) by mouth every 8 (eight) hours as needed for nausea or vomiting.   OVER THE COUNTER MEDICATION Place 1-2 sprays into the nose daily as needed (allergies, nasal congestion). Unknown nasal spray.   polyethylene  glycol (MIRALAX / GLYCOLAX) 17 g packet Take 17 g by mouth daily as needed for mild constipation.   tamsulosin (FLOMAX) 0.4 MG CAPS capsule TAKE TWO CAPSULE BY MOUTH DAILY   MAGNESIUM OXIDE PO Take 1 tablet by mouth daily. (Patient not taking: Reported on 08/04/2023)   traMADol (ULTRAM) 50 MG tablet Take 0.5-1 tablets (25-50 mg total) by mouth every 8 (eight) hours as needed. (Patient not taking: Reported on 08/04/2023)   No facility-administered encounter medications on file as of 08/08/2023.    Allergies (verified) Codeine, Lipitor [atorvastatin], and Bactrim  [sulfamethoxazole-trimethoprim]   History: Past Medical History:  Diagnosis Date   Actinic keratosis 03/28/2023   glabella, needs LN2   Aortic atherosclerosis (HCC)    a. Noted on CT 02/2023.   Arthritis    Basal cell carcinoma 03/28/2023   right paranasal, needs mohs   BPH (benign prostatic hyperplasia)    nocturia x4 at baseline   Coronary artery calcification seen on CT scan    H/O hiatal hernia    Hemorrhoids    History of echocardiogram    a. 04/2023 Echo: EF 60-65%, no rwma, mild LVH, nl RV fxn, triv MR.   Hyperlipidemia    Hypertension    Dr Wylene Simmer- LOV with clearance 3/13 on chart   IGT (impaired glucose tolerance)    last AIC 5.5- diet controlled- per office note 3/13   Paroxysmal atrial fibrillation (HCC) 04/14/2023   a. 04/2023 Admitted with A-fib RVR.  Normal echo.  Discharged on amiodarone; b. 05/2023 Zio: Predominantly sinus rhythm at 66 (50-88).  First-degree AV block.  4 beats of NSVT.  No sustained arrhythmias.  Amiodarone discontinued.   SCC (squamous cell carcinoma) 02/10/2005   scc in situ-left forearm.   SCCA (squamous cell carcinoma) of skin 02/10/2005   Right Outer Eye (in situ) (curet and excision)   SCCA (squamous cell carcinoma) of skin 02/13/2006   Right Ear (well diff) (curet and 5FU)   SCCA (squamous cell carcinoma) of skin 04/05/2006   Left Ear Bowl (in situ) (tx p bx)   SCCA (squamous cell carcinoma) of skin 07/06/2010   Right Cheek (in situ) (curet and zyclara)   SCCA (squamous cell carcinoma) of skin 04/27/2009   Left Upper Forearm (in situ) (curet 5FU)   SCCA (squamous cell carcinoma) of skin 08/28/2014   Right Cheek (well diff)   SCCA (squamous cell carcinoma) of skin 03/21/2016   Left Jawline Ant (well diff) (curet and 5FU)   SCCA (squamous cell carcinoma) of skin 01/19/2017   Right Hand (in situ) (tx p bx)   SCCA (squamous cell carcinoma) of skin 03/04/1840   Left Jawline Inf (in situ) (curet and 5FU)   SCCA (squamous cell carcinoma) of  skin 12/23/2020   Left Forehead (well diff) (tx p bx)   SCCA (squamous cell carcinoma) of skin 12/23/2020   Left Submandibular Area (in situ) (tx p bx)   Seasonal allergies    Spinal stenosis    Squamous cell carcinoma of skin 08/31/2020   in situ-left jawline cheek   Squamous cell carcinoma of skin 04/27/2021   infil- left nasal sidewall(MOHS)   Squamous cell carcinoma of skin 03/28/2023   Left medial cheek above nasolabial, needs mohs   Squamous cell carcinoma of skin of face 04/27/2021   infil- Left Temporal scalp (MOHS   UTI (lower urinary tract infection)    [facility]   Past Surgical History:  Procedure Laterality Date   BACK SURGERY  2008  Laminectomy L4-5   JOINT REPLACEMENT Bilateral    bil   KNEE ARTHROSCOPY     right   LUMBAR LAMINECTOMY/DECOMPRESSION MICRODISCECTOMY Bilateral 11/20/2018   Procedure: Bilateral Thoracic Twelve to Lumbar One Laminectomy;  Surgeon: Barnett Abu, MD;  Location: [facility] OR;  Service: Neurosurgery;  Laterality: Bilateral;  posterior   TOTAL HIP ARTHROPLASTY  03/20/2012   Procedure: TOTAL HIP ARTHROPLASTY ANTERIOR APPROACH;  Surgeon: Shelda Pal, MD;  Location: [facility] ORS;  Service: Orthopedics;  Laterality: Left;   Family History  Problem Relation Age of Onset   Arthritis Mother    Lung cancer Father    Asthma Father    Colon cancer Neg Hx    Prostate cancer Neg Hx    Social History   Socioeconomic History   Marital status: Married    Spouse name: Not on file   Number of children: 2   Years of education: Not on file   Highest education level: Not on file  Occupational History   Occupation: retired  Tobacco Use   Smoking status: Never   Smokeless tobacco: Never  Vaping Use   Vaping status: Never Used  Substance and Sexual Activity   Alcohol use: Yes    Comment: socially, wine   Drug use: No   Sexual activity: Not on file  Other Topics Concern   Not on file  Social History Narrative   Plays bridge    [facility] [employer]     2 kids, one local.     [employer] grad.     Married 1962   Prev in Engineer, maintenance (IT), retired.     Social Determinants of Health   Financial Resource Strain: Low Risk  (08/08/2023)   Overall Financial Resource Strain (CARDIA)    Difficulty of Paying Living Expenses: Not hard at all  Food Insecurity: No Food Insecurity (08/08/2023)   Hunger Vital Sign    Worried About Running Out of Food in the Last Year: Never true    Ran Out of Food in the Last Year: Never true  Transportation Needs: No Transportation Needs (08/08/2023)   PRAPARE - Administrator, Civil Service (Medical): No    Lack of Transportation (Non-Medical): No  Physical Activity: Inactive (08/08/2023)   Exercise Vital Sign    Days of Exercise per Week: 0 days    Minutes of Exercise per Session: 0 min  Stress: No Stress Concern Present (08/08/2023)   [employer] of Occupational Health - Occupational Stress Questionnaire    Feeling of Stress : Not at all  Social Connections: Socially Integrated (08/08/2023)   Social Connection and Isolation Panel [NHANES]    Frequency of Communication with Friends and Family: Twice a week    Frequency of Social Gatherings with Friends and Family: Once a week    Attends Religious Services: More than 4 times per year    Active Member of [employer] or Organizations: Yes    Attends Engineer, structural: More than 4 times per year    Marital Status: Married    Tobacco Counseling Counseling given: Not Answered   Clinical Intake:  Pre-visit preparation completed: Yes  Pain : No/denies pain     BMI - recorded: 34.72 Nutritional Status: BMI > 30  Obese Nutritional Risks: None Diabetes: No  How often do you need to have someone help you when you read instructions, pamphlets, or other written materials from your doctor or pharmacy?: 3 - Sometimes  Interpreter Needed?: No  Information entered  by :: C.Koleson Reifsteck LPN   Activities of Daily Living     08/08/2023   10:38 AM 04/14/2023    3:33 PM  In your present state of health, do you have any difficulty performing the following activities:  Hearing? 0   Vision? 0   Difficulty concentrating or making decisions? 0   Walking or climbing stairs? 1   Comment due to knees and back   Dressing or bathing? 0   Doing errands, shopping? 0 0  Preparing Food and eating ? N   Using the Toilet? N   In the past six months, have you accidently leaked urine? N   Do you have problems with loss of bowel control? N   Managing your Medications? N   Managing your Finances? N   Housekeeping or managing your Housekeeping? N     Patient Care Team: Joaquim Nam, MD as PCP - General (Family Medicine) Marykay Lex, MD as PCP - Cardiology (Cardiology) Barnett Abu, MD as Consulting Physician (Neurosurgery) Salvatore Marvel, MD as Consulting Physician (Orthopedic Surgery) Janalyn Harder, MD (Inactive) as Consulting Physician (Dermatology) Glyn Ade, PA-C as Physician Assistant (Dermatology)  Indicate any recent Medical Services you may have received from other than [facility] providers in the past year (date may be approximate).     Assessment:   This is a routine wellness examination for Shaine.  Hearing/Vision screen Hearing Screening - Comments:: Denies hearing difficulties   Vision Screening - Comments:: Readers - [facility] - UTD on eye exams  Dietary issues and exercise activities discussed:     Goals Addressed             This Visit's Progress    Patient Stated       Go back to the gym       Depression Screen    08/08/2023    3:35 PM 04/20/2023   10:36 AM 04/07/2023   12:27 PM 03/24/2023   11:03 AM 08/05/2022    8:19 AM 04/12/2022   12:43 PM  PHQ 2/9 Scores  PHQ - 2 Score 0 0 0 0 0 0  PHQ- 9 Score  0 0 0 0     Fall Risk    08/08/2023   10:38 AM 04/20/2023   10:35 AM 04/07/2023   12:27 PM 03/24/2023   11:03 AM 01/23/2023    2:11 PM  Fall Risk   Falls in the past year? 0  0 0 0 0  Number falls in past yr: 0 0 0 0 0  Injury with Fall? 0 0 0 0 0  Risk for fall due to : No Fall Risks History of fall(s) No Fall Risks No Fall Risks No Fall Risks  Follow up Falls prevention discussed;Falls evaluation completed Falls evaluation completed Falls evaluation completed Falls evaluation completed Falls evaluation completed    MEDICARE RISK AT HOME: Medicare Risk at Home Any stairs in or around the home?: No If so, are there any without handrails?: No Home free of loose throw rugs in walkways, pet beds, electrical cords, etc?: Yes Adequate lighting in your home to reduce risk of falls?: Yes Life alert?: No Use of a cane, walker or w/c?: No Grab bars in the bathroom?: Yes Shower chair or bench in shower?: Yes Elevated toilet seat or a handicapped toilet?: Yes  TIMED UP AND GO:  Was the test performed?  No    Cognitive Function:        08/08/2023    3:42 PM  08/05/2022    8:22 AM  6CIT Screen  What Year?  0 points  What month? 0 points 0 points  What time? 0 points 0 points  Count back from 20 0 points 0 points  Months in reverse 0 points 0 points  Repeat phrase 0 points 0 points  Total Score  0 points    Immunizations Immunization History  Administered Date(s) Administered   COVID-19, mRNA, vaccine(Comirnaty)12 years and older 10/18/2022   Fluad Quad(high Dose 65+) 08/27/2020, 09/19/2022   Influenza-Unspecified 09/17/2019   Moderna Sars-Covid-2 Vaccination 01/01/2020, 02/06/2020, 10/10/2020, 04/17/2021   Pfizer Covid-19 Vaccine Bivalent Booster 23yrs & up 09/22/2021   Pneumococcal Conjugate-13 08/25/2014   Pneumococcal Polysaccharide-23 03/23/2009   Rsv, Bivalent, Protein Subunit Rsvpref,pf Verdis Frederickson) 10/18/2022   Td 02/17/2014   Tdap 10/26/2021   Zoster, Live 03/23/2009    TDAP status: Up to date  Flu Vaccine status: Due, Education has been provided regarding the importance of this vaccine. Advised may receive this vaccine at local pharmacy or  Health Dept. Aware to provide a copy of the vaccination record if obtained from local pharmacy or Health Dept. Verbalized acceptance and understanding.  Pneumococcal vaccine status: Up to date  Covid-19 vaccine status: Information provided on how to obtain vaccines.   Qualifies for Shingles Vaccine? Yes   Zostavax completed Yes   Shingrix Completed?: No.    Education has been provided regarding the importance of this vaccine. Patient has been advised to call insurance company to determine out of pocket expense if they have not yet received this vaccine. Advised may also receive vaccine at local pharmacy or Health Dept. Verbalized acceptance and understanding.  Screening Tests Health Maintenance  Topic Date Due   Zoster Vaccines- Shingrix (1 of 2) 01/20/1960   COVID-19 Vaccine (7 - 2023-24 season) 08/06/2023   INFLUENZA VACCINE  03/04/2024 (Originally 07/06/2023)   Medicare Annual Wellness (AWV)  08/07/2024   DTaP/Tdap/Td (3 - Td or Tdap) 10/27/2031   Pneumonia Vaccine 87+ Years old  Completed   HPV VACCINES  Aged Out    Health Maintenance  Health Maintenance Due  Topic Date Due   Zoster Vaccines- Shingrix (1 of 2) 01/20/1960   COVID-19 Vaccine (7 - 2023-24 season) 08/06/2023    Colorectal cancer screening: No longer required.   Lung Cancer Screening: (Low Dose CT Chest recommended if Age 58-80 years, 20 pack-year currently smoking OR have quit w/in 15years.) does not qualify.   Lung Cancer Screening Referral:    Additional Screening:  Hepatitis C Screening: does not qualify;  Vision Screening: Recommended annual ophthalmology exams for early detection of glaucoma and other disorders of the eye. Is the patient up to date with their annual eye exam?  Yes  Who is the provider or what is the name of the office in which the patient attends annual eye exams? [facility] If pt is not established with a provider, would they like to be referred to a provider to establish care? No .    Dental Screening: Recommended annual dental exams for proper oral hygiene  Diabetic Foot Exam:   Community Resource Referral / Chronic Care Management: CRR required this visit?  No   CCM required this visit?  No     Plan:     I have personally reviewed and noted the following in the patient's chart:   Medical and social history Use of alcohol, tobacco or illicit drugs  Current medications and supplements including opioid prescriptions. Patient is not currently taking opioid prescriptions.  Functional ability and status Nutritional status Physical activity Advanced directives List of other physicians Hospitalizations, surgeries, and ER visits in previous 12 months Vitals Screenings to include cognitive, depression, and falls Referrals and appointments  In addition, I have reviewed and discussed with patient certain preventive protocols, quality metrics, and best practice recommendations. A written personalized care plan for preventive services as well as general preventive health recommendations were provided to patient.     Maryan Puls, LPN   4/0/9811   After Visit Summary: (MyChart) Due to this being a telephonic visit, the after visit summary with patients personalized plan was offered to patient via MyChart   Nurse Notes: none

## 2023-08-08 NOTE — Patient Instructions (Signed)
Todd Gonzales , Thank you for taking time to come for your Medicare Wellness Visit. I appreciate your ongoing commitment to your health goals. Please review the following plan we discussed and let me know if I can assist you in the future.   Referrals/Orders/Follow-Ups/Clinician Recommendations: Aim for 30 minutes of exercise or brisk walking, 6-8 glasses of water, and 5 servings of fruits and vegetables each day.   This is a list of the screening recommended for you and due dates:  Health Maintenance  Topic Date Due   Zoster (Shingles) Vaccine (1 of 2) 01/20/1960   COVID-19 Vaccine (7 - 2023-24 season) 08/06/2023   Medicare Annual Wellness Visit  08/06/2023   Flu Shot  03/04/2024*   DTaP/Tdap/Td vaccine (3 - Td or Tdap) 10/27/2031   Pneumonia Vaccine  Completed   HPV Vaccine  Aged Out  *Topic was postponed. The date shown is not the original due date.    Advanced directives: (Copy Requested) Please bring a copy of your health care power of attorney and living will to the office to be added to your chart at your convenience.  Next Medicare Annual Wellness Visit scheduled for next year: Yes

## 2023-08-15 ENCOUNTER — Ambulatory Visit: Payer: Medicare Other | Admitting: Student

## 2023-08-16 ENCOUNTER — Encounter: Payer: Self-pay | Admitting: Dermatology

## 2023-08-16 ENCOUNTER — Ambulatory Visit: Payer: Medicare Other | Admitting: Dermatology

## 2023-08-16 DIAGNOSIS — Z7189 Other specified counseling: Secondary | ICD-10-CM

## 2023-08-16 DIAGNOSIS — L578 Other skin changes due to chronic exposure to nonionizing radiation: Secondary | ICD-10-CM

## 2023-08-16 DIAGNOSIS — W908XXA Exposure to other nonionizing radiation, initial encounter: Secondary | ICD-10-CM | POA: Diagnosis not present

## 2023-08-16 DIAGNOSIS — D485 Neoplasm of uncertain behavior of skin: Secondary | ICD-10-CM

## 2023-08-16 DIAGNOSIS — L570 Actinic keratosis: Secondary | ICD-10-CM | POA: Diagnosis not present

## 2023-08-16 DIAGNOSIS — Z85828 Personal history of other malignant neoplasm of skin: Secondary | ICD-10-CM

## 2023-08-16 DIAGNOSIS — C44729 Squamous cell carcinoma of skin of left lower limb, including hip: Secondary | ICD-10-CM

## 2023-08-16 DIAGNOSIS — Z872 Personal history of diseases of the skin and subcutaneous tissue: Secondary | ICD-10-CM

## 2023-08-16 NOTE — Patient Instructions (Signed)

## 2023-08-16 NOTE — Progress Notes (Signed)
Follow-Up Visit   Subjective  Todd Gonzales is a 82 y.o. male who presents for the following: Irregular irritated skin lesion that patient is concerned about and would like checked and treated.  The patient has spots, moles and lesions to be evaluated, some may be new or changing and the patient may have concern these could be cancer.   The following portions of the chart were reviewed this encounter and updated as appropriate: medications, allergies, medical history  Review of Systems:  No other skin or systemic complaints except as noted in HPI or Assessment and Plan.  Objective  Well appearing patient in no apparent distress; mood and affect are within normal limits.    A focused examination was performed of the following areas: the face, arms, neck, and legs   Relevant exam findings are noted in the Assessment and Plan.  L lat upper pretibia 1.1 cm pink tender scaly papule     L forehead/temple x 6, L cheek x 10, L nasal tip x 1 (17) Erythematous thin papules/macules with gritty scale.     Assessment & Plan     Neoplasm of uncertain behavior of skin L lat upper pretibia  Epidermal / dermal shaving  Lesion diameter (cm):  1.1 Informed consent: discussed and consent obtained   Patient was prepped and draped in usual sterile fashion: area prepped with alcohol. Anesthesia: the lesion was anesthetized in a standard fashion   Anesthetic:  1% lidocaine w/ epinephrine 1-100,000 buffered w/ 8.4% NaHCO3 Instrument used: flexible razor blade   Hemostasis achieved with: pressure, aluminum chloride and electrodesiccation   Outcome: patient tolerated procedure well    Destruction of lesion  Destruction method: electrodesiccation and curettage   Informed consent: discussed and consent obtained   Curettage performed in three different directions: Yes   Electrodesiccation performed over the curetted area: Yes   Final wound size (cm):  1.2 Hemostasis achieved with:   pressure, aluminum chloride and electrodesiccation Outcome: patient tolerated procedure well with no complications   Post-procedure details: wound care instructions given    Specimen 1 - Surgical pathology Differential Diagnosis: D48.5 r/o SCC ED&C today Check Margins: No  AK (actinic keratosis) (17) L forehead/temple x 6, L cheek x 10, L nasal tip x 1  Actinic keratoses are precancerous spots that appear secondary to cumulative UV radiation exposure/sun exposure over time. They are chronic with expected duration over 1 year. A portion of actinic keratoses will progress to squamous cell carcinoma of the skin. It is not possible to reliably predict which spots will progress to skin cancer and so treatment is recommended to prevent development of skin cancer.  Recommend daily broad spectrum sunscreen SPF 30+ to sun-exposed areas, reapply every 2 hours as needed.  Recommend staying in the shade or wearing long sleeves, sun glasses (UVA+UVB protection) and wide brim hats (4-inch brim around the entire circumference of the hat). Call for new or changing lesions.  Will treat R side of face at f/u appt in one month.   Destruction of lesion - L forehead/temple x 6, L cheek x 10, L nasal tip x 1 (17)  Destruction method: cryotherapy   Informed consent: discussed and consent obtained   Lesion destroyed using liquid nitrogen: Yes   Region frozen until ice ball extended beyond lesion: Yes   Outcome: patient tolerated procedure well with no complications   Post-procedure details: wound care instructions given   Additional details:  Prior to procedure, discussed risks of blister formation, small  wound, skin dyspigmentation, or rare scar following cryotherapy. Recommend Vaseline ointment to treated areas while healing.   ACTINIC DAMAGE WITH PRECANCEROUS ACTINIC KERATOSES Counseling for Topical Chemotherapy Management: Patient exhibits: - Severe, confluent actinic changes with pre-cancerous actinic  keratoses that is secondary to cumulative UV radiation exposure over time - Condition that is severe; chronic, not at goal. - diffuse scaly erythematous macules and papules with underlying dyspigmentation - Discussed Prescription "Field Treatment" topical Chemotherapy for Severe, Chronic Confluent Actinic Changes with Pre-Cancerous Actinic Keratoses Field treatment involves treatment of an entire area of skin that has confluent Actinic Changes (Sun/ Ultraviolet light damage) and PreCancerous Actinic Keratoses by method of PhotoDynamic Therapy (PDT) and/or prescription Topical Chemotherapy agents such as 5-fluorouracil, 5-fluorouracil/calcipotriene, and/or imiquimod.  The purpose is to decrease the number of clinically evident and subclinical PreCancerous lesions to prevent progression to development of skin cancer by chemically destroying early precancer changes that may or may not be visible.  It has been shown to reduce the risk of developing skin cancer in the treated area. As a result of treatment, redness, scaling, crusting, and open sores may occur during treatment course. One or more than one of these methods may be used and may have to be used several times to control, suppress and eliminate the PreCancerous changes. Discussed treatment course, expected reaction, and possible side effects. - Recommend daily broad spectrum sunscreen SPF 30+ to sun-exposed areas, reapply every 2 hours as needed.  - Staying in the shade or wearing long sleeves, sun glasses (UVA+UVB protection) and wide brim hats (4-inch brim around the entire circumference of the hat) are also recommended. - Call for new or changing lesions.  - pt interested in doing TCA peel which he has had with Dr. Jorja Loa, which worked well.  He has had one red light treatment which worked temporarily.  Will readdress on f/up.  HISTORY OF BASAL CELL CARCINOMA OF THE SKIN - R paranasal, not treated, but clear today with 4/24 bx - No evidence of  recurrence today - Recommend regular full body skin exams - Recommend daily broad spectrum sunscreen SPF 30+ to sun-exposed areas, reapply every 2 hours as needed.  - Call if any new or changing lesions are noted between office visits  HISTORY OF SQUAMOUS CELL CARCINOMA OF THE SKIN - L med cheek, not treated, but clear today with 4/24 bx - No evidence of recurrence today - Recommend regular full body skin exams - Recommend daily broad spectrum sunscreen SPF 30+ to sun-exposed areas, reapply every 2 hours as needed.  - Call if any new or changing lesions are noted between office visits  HISTORY OF PRECANCEROUS ACTINIC KERATOSIS - 4/24 bx proven at the glabella, clear today no treatment needed - site(s) of PreCancerous Actinic Keratosis clear today. - these may recur and new lesions may form requiring treatment to prevent transformation into skin cancer - observe for new or changing spots and contact [facility] for appointment if occur - photoprotection with sun protective clothing; sunglasses and broad spectrum sunscreen with SPF of at least 30 + and frequent self skin exams recommended - yearly exams by a dermatologist recommended for persons with history of PreCancerous Actinic Keratoses  Return for appointment as scheduled. One month.  Maylene Roes, CMA, am acting as scribe for Willeen Niece, MD .  Documentation: I have reviewed the above documentation for accuracy and completeness, and I agree with the above.  Willeen Niece, MD

## 2023-08-18 LAB — SURGICAL PATHOLOGY

## 2023-08-21 DIAGNOSIS — H353290 Exudative age-related macular degeneration, unspecified eye, stage unspecified: Secondary | ICD-10-CM | POA: Diagnosis not present

## 2023-08-21 DIAGNOSIS — Z0100 Encounter for examination of eyes and vision without abnormal findings: Secondary | ICD-10-CM | POA: Diagnosis not present

## 2023-08-21 DIAGNOSIS — H40003 Preglaucoma, unspecified, bilateral: Secondary | ICD-10-CM | POA: Diagnosis not present

## 2023-08-21 DIAGNOSIS — H34832 Tributary (branch) retinal vein occlusion, left eye, with macular edema: Secondary | ICD-10-CM | POA: Diagnosis not present

## 2023-08-21 DIAGNOSIS — H2513 Age-related nuclear cataract, bilateral: Secondary | ICD-10-CM | POA: Diagnosis not present

## 2023-08-22 ENCOUNTER — Telehealth: Payer: Self-pay

## 2023-08-22 NOTE — Telephone Encounter (Signed)
-----   Message from Willeen Niece sent at 08/22/2023  1:39 PM EDT ----- Skin , L lat upper pretibia WELL DIFFERENTIATED SQUAMOUS CELL CARCINOMA  SCC skin cancer- already treated with EDC at time of biopsy   - please call patient

## 2023-08-22 NOTE — Telephone Encounter (Signed)
Patient advised biopsy of the left lateral upper pretibia was SCC and has already been treated with EDC. Will recheck at follow-up appointment.

## 2023-08-23 ENCOUNTER — Telehealth (HOSPITAL_COMMUNITY): Payer: Self-pay | Admitting: Emergency Medicine

## 2023-08-23 NOTE — Telephone Encounter (Signed)
Reaching out to patient to offer assistance regarding upcoming cardiac imaging study; pt verbalizes understanding of appt date/time, parking situation and where to check in, pre-test NPO status and medications ordered, and verified current allergies; name and call back number provided for further questions should they arise Rockwell Alexandria RN Navigator Cardiac Imaging [facility] [phone] office [phone] cell  Holding lasix

## 2023-08-24 ENCOUNTER — Ambulatory Visit
Admission: RE | Admit: 2023-08-24 | Discharge: 2023-08-24 | Disposition: A | Discharge status: Home / Self Care | Payer: Medicare Other | Source: Ambulatory Visit | Attending: Nurse Practitioner | Admitting: Nurse Practitioner

## 2023-08-24 DIAGNOSIS — I5033 Acute on chronic diastolic (congestive) heart failure: Secondary | ICD-10-CM | POA: Insufficient documentation

## 2023-08-24 MED ORDER — METOPROLOL TARTRATE 5 MG/5ML IV SOLN
10.0000 mg | INTRAVENOUS | Status: AC | PRN
  Administered 2023-08-24 (×2): 10 mg via INTRAVENOUS

## 2023-08-24 MED ORDER — IOHEXOL 350 MG/ML SOLN
100.0000 mL | Freq: Once | INTRAVENOUS | Status: AC | PRN
  Administered 2023-08-24: 100 mL via INTRAVENOUS

## 2023-08-24 MED ORDER — NITROGLYCERIN 0.4 MG SL SUBL
0.8000 mg | SUBLINGUAL_TABLET | Freq: Once | SUBLINGUAL | Status: AC
  Administered 2023-08-24: 0.8 mg via SUBLINGUAL

## 2023-08-24 MED ORDER — SODIUM CHLORIDE 0.9 % IV BOLUS
150.0000 mL | Freq: Once | INTRAVENOUS | Status: AC
  Administered 2023-08-24: 150 mL via INTRAVENOUS

## 2023-08-24 MED ORDER — DILTIAZEM HCL 25 MG/5ML IV SOLN: 10.0000 mg | INTRAVENOUS | Status: DC | PRN

## 2023-08-25 ENCOUNTER — Telehealth (HOSPITAL_COMMUNITY): Payer: Self-pay | Admitting: *Deleted

## 2023-08-25 NOTE — Telephone Encounter (Signed)
Reaching out to patient to offer assistance regarding upcoming cardiac imaging study; pt verbalizes understanding of appt date/time, parking situation and where to check in, pre-test NPO status and medications ordered, and verified current allergies; name and call back number provided for further questions should they arise Todd Frame RN Navigator Cardiac Imaging [facility] [phone] office [phone] cell  Patient aware to take metoprolol 2 hours prior (at 730 AM).

## 2023-08-28 ENCOUNTER — Ambulatory Visit
Admission: RE | Admit: 2023-08-28 | Discharge: 2023-08-28 | Disposition: A | Discharge status: Home / Self Care | Payer: Medicare Other | Source: Ambulatory Visit | Attending: Nurse Practitioner | Admitting: Nurse Practitioner

## 2023-08-28 DIAGNOSIS — I5033 Acute on chronic diastolic (congestive) heart failure: Secondary | ICD-10-CM | POA: Insufficient documentation

## 2023-08-28 DIAGNOSIS — I2510 Atherosclerotic heart disease of native coronary artery without angina pectoris: Secondary | ICD-10-CM

## 2023-08-28 MED ORDER — IOHEXOL 350 MG/ML SOLN
80.0000 mL | Freq: Once | INTRAVENOUS | Status: AC | PRN
  Administered 2023-08-28: 80 mL via INTRAVENOUS

## 2023-08-28 MED ORDER — NITROGLYCERIN 0.4 MG SL SUBL
0.8000 mg | SUBLINGUAL_TABLET | Freq: Once | SUBLINGUAL | Status: AC
  Administered 2023-08-28: 0.8 mg via SUBLINGUAL
  Filled 2023-08-28: qty 25

## 2023-08-28 NOTE — Progress Notes (Signed)
Patient tolerated procedure well. Ambulate w/o difficulty. Denies any lightheadedness or being dizzy. Pt denies any pain at this time. Sitting in chair. Pt is encouraged to drink additional water throughout the day and reason explained to patient. Patient verbalized understanding and all questions answered. ABC intact. No further needs at this time. Discharge from procedure area w/o issues. 

## 2023-08-29 ENCOUNTER — Ambulatory Visit: Payer: Medicare Other

## 2023-08-29 ENCOUNTER — Telehealth: Payer: Self-pay | Admitting: Family Medicine

## 2023-08-29 DIAGNOSIS — K080 Exfoliation of teeth due to systemic causes: Secondary | ICD-10-CM | POA: Diagnosis not present

## 2023-08-29 NOTE — Telephone Encounter (Signed)
Spoke with Todd Gonzales to try and explain protocol for getting the viscosupplementation approved through his insurance and making sure we have the medication in stock before scheduling these injections.  I advised we have reached out to try and get this approved through his insurance but if I don't hear back from them by the end of the day we will have to reschedule his appointment tomorrow.  Patient states understanding.

## 2023-08-29 NOTE — Telephone Encounter (Signed)
Todd Gonzales is on my schedule for a viscosupplementation injection tomorrow.   I cannot tell if we have determined his preferred product and responsibility - I don't see it in the chart.   [name] and [name] - do you all know?  He also may have checked his own benefits.  He has used Synvisc in the past, but we normally do not stock it.

## 2023-08-29 NOTE — Telephone Encounter (Signed)
I have cancelled patient's appointment for tomorrow.  I let him know once we here back from [facility] and have the medication in stock, we will call him and get him scheduled.  Patient states understanding.   Dr. Patsy Lager let me know if you would like me to put a order in to pharmacy for Synvisc.

## 2023-08-29 NOTE — Telephone Encounter (Signed)
Sure - let's get 2 orders of Synvisc-One.

## 2023-08-29 NOTE — Telephone Encounter (Signed)
Dr. Patsy Lager with the pt's insurance being [employer] it looks like Synvisc is the preferred medication.  Makes sense that he's had several Synvisc injections at another office. If that is the case are you agreeable to [place] ordering Synvisc for the pt?

## 2023-08-30 ENCOUNTER — Ambulatory Visit: Payer: Medicare Other | Admitting: Family Medicine

## 2023-08-30 NOTE — Progress Notes (Incomplete)
Cardiology Office Note:  .   Date:  08/30/2023  ID:  LUTHUR LINDMARK, DOB 02/07/1941, MRN [id number] PCP: Joaquim Nam, MD  [facility] Providers Cardiologist:  Bryan Lemma, MD { Click to update primary MD,subspecialty MD or APP then REFRESH:1}    No chief complaint on file.   Patient Profile: .     Todd Gonzales is a *** 82 y.o. male *** with a PMH notable for PAF, HTN, HLD" reported CAD "who presents here for 1 month follow-up at the request of Joaquim Nam, MD.  Hospitalized [place] May 2024 for dyspnea fatigue;  Noted to be in A-fib/flutter with RVR, also noted PVCs. Given 1 dose of IV Lasix (echo with normal LV function) => Chemically cardioverted with amiodarone; discharged on Lopressor 25 mg twice daily, and Eliquis.   I saw KOSUKE CALL   He was most recently seen on August 30 by Nicolasa Ducking, NP with complaint of increased lower extremity swelling and dyspnea.  PCP saw him and advised increasing Lasix 20 mg daily.  Mild BNP elevation at 131.  He lost from 257 down to 243 pounds after 1 week of Lasix but improved lower extremity swelling and resolution of dyspnea.  Had 1 episode of hypoxia with EMS evaluation, but was up in the 90s without intervention. M=      Subjective   INTERVAL HPI   ROS:  Cardiovascular ROS: {roscv:310661} Review of Systems - {ros master:310782}     Objective   Studies Reviewed: Marland Kitchen        ECHO: ***  Risk Assessment/Calculations:   {Does this patient have ATRIAL FIBRILLATION?:[id number]} No BP recorded.  {Refresh Note OR Click here to enter BP  :1}***         Physical Exam:   VS:  There were no vitals taken for this visit.   Wt Readings from Last 3 Encounters:  08/08/23 242 lb (109.8 kg)  08/04/23 243 lb 9.6 oz (110.5 kg)  07/28/23 257 lb 2 oz (116.6 kg)    GEN: Well nourished, well developed in no acute distress; *** NECK: No JVD; No carotid bruits CARDIAC: Normal S1, S2; RRR, no murmurs, rubs,  gallops RESPIRATORY:  Clear to auscultation without rales, wheezing or rhonchi ; nonlabored, good air movement. ABDOMEN: Soft, non-tender, non-distended EXTREMITIES:  No edema; No deformity      ASSESSMENT AND PLAN: .    Problem List Items Addressed This Visit   None       {Are you ordering a CV Procedure (e.g. stress test, cath, DCCV, TEE, etc)?   Press F2        :[id number]}   Dispo: No follow-ups on file.  Total time spent: *** min spent with patient + *** min spent charting = *** min      Signed, Marykay Lex, MD, MS Bryan Lemma, M.D., M.S. Interventional Cardiologist  [facility]  Pager # [phone] Phone # [phone] [address] [place], [place] [address]

## 2023-08-30 NOTE — Telephone Encounter (Signed)
Paper prior auth in box for signature.  Need help with questions 3 and 4.  If answered yes, need to submit medical records to support.  I've called pt injections were previously done at [facility].

## 2023-08-30 NOTE — Telephone Encounter (Signed)
PA faxed to [facility].

## 2023-08-30 NOTE — Progress Notes (Signed)
Cardiology Office Note:  .   Date:  08/31/2023  ID:  Todd Gonzales, DOB [birth year], MRN [id number] PCP: Joaquim Nam, MD  [facility] Providers Cardiologist:  Bryan Lemma, MD     Chief Complaint  Patient presents with   Follow-up    3 month follow up. Patient feels ok today, no heart related complaints. CTA review. Medications reviewed verbally.    Patient Profile: .     Todd Gonzales is an obese 82 y.o. male  with a PMH notable for PAF, HTN, HLD" reported CAD "who presents here for 1 month follow-up at the request of Joaquim Nam, MD.  Hospitalized [place] May 2024 for dyspnea fatigue;  Noted to be in A-fib/flutter with RVR, also noted PVCs. Given 1 dose of IV Lasix (echo with normal LV function) => Chemically cardioverted with amiodarone; discharged on Lopressor 25 mg twice daily, and Eliquis.  I saw Todd Gonzales for hospital follow-up => I actually discontinued amiodarone and continued beta-blocker.  Ordered a 14-day Zio patch monitor.  Plan was to use PRN amiodarone for breakthrough spells of A-fib.  Recommended 20 mg daily Lasix.  He was most recently seen on August 30 by Todd Ducking, NP with complaint of increased lower extremity swelling and dyspnea.  Apparently he was taking 10 mg Lasix daily and often skipping doses.  PCP saw him and advised increasing Lasix 20 mg daily.  Mild BNP elevation at 131.  He lost from 257 down to 243 pounds after 1 week of Lasix but improved lower extremity swelling and resolution of dyspnea.  Had 1 episode of hypoxia with EMS evaluation, but was up in the 90s without intervention.  Marland Kitchen  He enjoys eating potato chips and bologna which were subsequently removed from the house.  No chest pain or chronic dyspnea.  Slight progression. => Noted 1+40 lower extremity edema-more or less baseline. Plan to continue on 20 mg Lasix daily.  Continue beta-blocker. Coronary CTA ordered pending lab work based on dyspnea.    Subjective    INTERVAL HPI Todd Gonzales returns today for reassessment and to discuss results of his coronary CTA.  He actually seems to doing pretty well overall and stable send his home weights ranged between 245 2050 pounds was currently 250 today.  He is due to take a dose of Lasix because of swelling is up a little bit.  He has not been weighing himself daily but states he has been taking Lasix just about once every 3 days.  He really has not noticed that much in the way of any PND or orthopnea, but does note the edema is a little worse today.  No sensation of chest pain or pressure with rest or exertion.  No rapid irregular heartbeats or palpitations to suspect recurrence of A-fib.  Intermittently has episodes of hypoxia that is short-lived, but usually his oxygen sats are in the 92 to 94% range.  He does acknowledge not not always eating right, but doing better.  Swallowing injections  Cardiovascular ROS: positive for - dyspnea on exertion, edema, palpitations, and this is pretty stable exertional dyspnea is much from being deconditioned.  Palpitations are very fleeting several occasional lightheadedness. negative for - chest pain, orthopnea, paroxysmal nocturnal dyspnea, shortness of breath, or near syncope, syncope or TIA/amaurosis fugax, claudication ; melena, hematochezia, hematuria; epistaxis  ROS:   Review of Systems - Negative except leg pain and neuropathy type symptoms     Objective  Studies Reviewed: Marland Kitchen   EKG Interpretation Date/Time:  Thursday August 31 2023 10:30:09 EDT Ventricular Rate:  61 PR Interval:  192 QRS Duration:  92 QT Interval:  396 QTC Calculation: 398 R Axis:   12  Text Interpretation: Normal sinus rhythm Normal ECG When compared with ECG of 04-Aug-2023 10:53, Rate slower Otherwise no significant change Confirmed by Bryan Lemma ([id number]) on 08/31/2023 10:41:01 AM    ECHO Apr 14, 2023: EF 60 to 65%.  Mild LVH trivial MR.  Mild LVH.  AoV sclerosis  ~3-day Zio  patch monitor August 2024.   Predominant rhythm is sinus rhythm with first AV block.  Rate range 50 to 88 bpm and average 66 bpm.   Rare PACs and PVCs noted.   1 short atrial run of 4 beats with a rate range of 120-138 bpm and average of 130 bpm.  Lasted 2.1 seconds.   Patient noted symptoms with sinus rhythm and a PAC.   No Sustained Arrhythmias: Atrial Tachycardia (AT), Supraventricular Tachycardia (SVT), Atrial Fibrillation (A-Fib), Atrial Flutter (A-Flutter), Sustained Ventricular Tachycardia (VT)  Coronary CTA 08/28/2023: Coronary Calcium Score 4 9.  Mild LAD and RCA stenosis (25 to 49%).  Minimal LCx (less than 25%).  CAD-RADS 2.  Lipid panel 04/16/2023: TC 109, TG 51, HDL 41, LDL 58  Risk Assessment/Calculations:    CHA2DS2-VASc Score = 5   This indicates a 7.2% annual risk of stroke. The patient's score is based upon: CHF History: 1 HTN History: 1 Diabetes History: 0 Stroke History: 0 Vascular Disease History: 1 Age Score: 2 Gender Score: 0   On DOAC       Physical Exam:   VS:  BP 128/84 (BP Location: Left Arm, Patient Position: Sitting, Cuff Size: Normal)   Pulse 61   Ht 5\' 10"  (1.778 m)   Wt 247 lb 6.4 oz (112.2 kg)   SpO2 94%   BMI 35.50 kg/m    Wt Readings from Last 3 Encounters:  08/31/23 247 lb 6.4 oz (112.2 kg)  08/08/23 242 lb (109.8 kg)  08/04/23 243 lb 9.6 oz (110.5 kg)    GEN: Well nourished, well developed in no acute distress; well groomed.:  Obese NECK: No JVD; No carotid bruits CARDIAC: Heart sounds, normal S1, S2; RRR, soft 1/6 SEM at RUSB.  Norubs, gallops RESPIRATORY:  Clear to auscultation without rales, wheezing or rhonchi ; nonlabored, good air movement. ABDOMEN: Soft, non-tender, non-distended;  EXTREMITIES:  ~2-3 + edema; No deformity      ASSESSMENT AND PLAN: .    Problem List Items Addressed This Visit       Cardiology Problems   Coronary artery disease, non-occlusive (Chronic)    Mild to moderate nonocclusive disease noted  on Coronary CTA.  Very reassuring given his advanced age.  Plan: Continue beta-blocker.  Based on last lipid levels his LDL is 58 and he is not on any medications for lipids.      HLD (hyperlipidemia) (Chronic)    Labs checked by PCP in May showed LDL of 58 and HDL of 41.  TG 109.  Not currently on statin, but based on Coronary CTA, no need for additional therapy.      HTN (hypertension) (Chronic)    Blood pressure looks relatively stable he is only on Lopressor.      Paroxysmal atrial fibrillation/flutter (HCC) - Primary (Chronic)    Paroxysmal A-fib but as far as he can tell no breakthrough episodes recently. Currently maintaining sinus rhythm with 25 mg twice  daily metoprolol.  Based on his level of symptoms with A-fib, it is clear that he probably has not had any prolonged episodes.  His main symptom was exacerbation of CHF. Monitor did not show any evidence of atrial febrile flutter.  Thankfully, echocardiogram results are reassuring as was Coronary CTA with no evidence of ischemia.  For now continue beta-blocker and Eliquis for CVA prophylaxis.      Relevant Orders   EKG 12-Lead (Completed)     Other   Bilateral lower extremity edema (Chronic)    With relatively normal echocardiogram other than mild diastolic dysfunction, difficult to indicate that the edema he is having right now is related to CHF.  More likely related to venous stasis.  I do think he would benefit from leg elevation and support stockings. Continues to take furosemide and we are trying to shoot for dry weight of 245 pounds.  Recommend that he takes his Lasix daily until he gets his weight back down to 245 and then at least once every other day to keep his weight stable.  Clearly the 10 mg dose was not working.             Dispo: Return in about 6 months (around 02/28/2024) for Alternate 6 month follow-up with APP & MD.  Total time spent: 28 min spent with patient + 14 min spent charting = 42  min     Signed, Marykay Lex, MD, MS Bryan Lemma, M.D., M.S. Interventional Cardiologist  [facility]  Pager # [phone] Phone # [phone] [address] [place], [place] [address]

## 2023-08-30 NOTE — Telephone Encounter (Signed)
Completed and placed in your box.  I did change to [facility] for medication.

## 2023-08-31 ENCOUNTER — Ambulatory Visit: Payer: Medicare Other | Attending: Cardiology | Admitting: Cardiology

## 2023-08-31 ENCOUNTER — Encounter: Payer: Self-pay | Admitting: Cardiology

## 2023-08-31 VITALS — BP 128/84 | HR 61 | Ht 70.0 in | Wt 247.4 lb

## 2023-08-31 DIAGNOSIS — I2510 Atherosclerotic heart disease of native coronary artery without angina pectoris: Secondary | ICD-10-CM

## 2023-08-31 DIAGNOSIS — E785 Hyperlipidemia, unspecified: Secondary | ICD-10-CM | POA: Diagnosis not present

## 2023-08-31 DIAGNOSIS — I10 Essential (primary) hypertension: Secondary | ICD-10-CM

## 2023-08-31 DIAGNOSIS — I480 Paroxysmal atrial fibrillation: Secondary | ICD-10-CM

## 2023-08-31 DIAGNOSIS — R600 Localized edema: Secondary | ICD-10-CM

## 2023-08-31 NOTE — Assessment & Plan Note (Signed)
Blood pressure looks relatively stable he is only on Lopressor.

## 2023-08-31 NOTE — Assessment & Plan Note (Signed)
Paroxysmal A-fib but as far as he can tell no breakthrough episodes recently. Currently maintaining sinus rhythm with 25 mg twice daily metoprolol.  Based on his level of symptoms with A-fib, it is clear that he probably has not had any prolonged episodes.  His main symptom was exacerbation of CHF. Monitor did not show any evidence of atrial febrile flutter.  Thankfully, echocardiogram results are reassuring as was Coronary CTA with no evidence of ischemia.  For now continue beta-blocker and Eliquis for CVA prophylaxis.

## 2023-08-31 NOTE — Patient Instructions (Addendum)
Medication Instructions:  Your physician recommends that you continue on your current medications as directed. Please refer to the Current Medication list given to you today.  *If you need a refill on your cardiac medications before your next appointment, please call your pharmacy*  Lab Work: -None ordered  Testing/Procedures: -None ordered  Follow-Up: At [facility], you and your health needs are our priority.  As part of our continuing mission to provide you with exceptional heart care, we have created designated Provider Care Teams.  These Care Teams include your primary Cardiologist (physician) and Advanced Practice Providers (APPs -  Physician Assistants and Nurse Practitioners) who all work together to provide you with the care you need, when you need it.  Your next appointment:   6 month(s)  Provider:   Nicolasa Ducking, NP    Other Instructions -Take furosemide (LASIX) 20 MG tablet today and tomorrow, try to keep weight around 245 lbs

## 2023-08-31 NOTE — Telephone Encounter (Signed)
Received fax from [facility], prior authorization has been denied. Denial letter has been placed in Dr. Cyndie Chime inbox.

## 2023-08-31 NOTE — Assessment & Plan Note (Signed)
Mild to moderate nonocclusive disease noted on Coronary CTA.  Very reassuring given his advanced age.  Plan: Continue beta-blocker.  Based on last lipid levels his LDL is 58 and he is not on any medications for lipids.

## 2023-08-31 NOTE — Assessment & Plan Note (Signed)
Labs checked by PCP in May showed LDL of 58 and HDL of 41.  TG 109.  Not currently on statin, but based on Coronary CTA, no need for additional therapy.

## 2023-08-31 NOTE — Assessment & Plan Note (Signed)
With relatively normal echocardiogram other than mild diastolic dysfunction, difficult to indicate that the edema he is having right now is related to CHF.  More likely related to venous stasis.  I do think he would benefit from leg elevation and support stockings. Continues to take furosemide and we are trying to shoot for dry weight of 245 pounds.  Recommend that he takes his Lasix daily until he gets his weight back down to 245 and then at least once every other day to keep his weight stable.  Clearly the 10 mg dose was not working.

## 2023-09-01 NOTE — Telephone Encounter (Signed)
Patient called in and stated that the shot has to be filed under medical instead of a drug for it to be covered. He stated that he can be reached at [phone].

## 2023-09-04 NOTE — Telephone Encounter (Signed)
Spoke with pt relaying Dr [employer]. Pt verbalizes understanding. Pt verbalizes understanding and declines to get injections with our office. States he can get them done at [facility] but expresses his thanks.

## 2023-09-04 NOTE — Telephone Encounter (Signed)
noted 

## 2023-09-04 NOTE — Telephone Encounter (Signed)
Can you check with him  It looks like his insurance denied the Synvisc injections - when billed as medical.  We can still do them, but they are expensive without insurance.  It is up to him.  If he would like, we can get him the injections understanding that it would likely costs multiple hundreds of dollars.   Misty Stanley, please let me know if he still wants to get them, since we do not keep Synvisc in stock.

## 2023-09-05 DIAGNOSIS — K080 Exfoliation of teeth due to systemic causes: Secondary | ICD-10-CM | POA: Diagnosis not present

## 2023-09-12 ENCOUNTER — Ambulatory Visit: Payer: Medicare Other | Admitting: Dermatology

## 2023-09-12 VITALS — BP 161/84 | HR 67

## 2023-09-12 DIAGNOSIS — Z85828 Personal history of other malignant neoplasm of skin: Secondary | ICD-10-CM | POA: Diagnosis not present

## 2023-09-12 DIAGNOSIS — L578 Other skin changes due to chronic exposure to nonionizing radiation: Secondary | ICD-10-CM | POA: Diagnosis not present

## 2023-09-12 DIAGNOSIS — L570 Actinic keratosis: Secondary | ICD-10-CM

## 2023-09-12 DIAGNOSIS — W908XXA Exposure to other nonionizing radiation, initial encounter: Secondary | ICD-10-CM | POA: Diagnosis not present

## 2023-09-12 DIAGNOSIS — Z7189 Other specified counseling: Secondary | ICD-10-CM

## 2023-09-12 NOTE — Progress Notes (Signed)
Follow-Up Visit   Subjective  Todd Gonzales is a 82 y.o. male who presents for the following: Actinic keratosis of the face, treat right face today. Left face treated with cryotherapy at last visit. The patient has spots, moles and lesions to be evaluated, some may be new or changing and the patient may have concern these could be cancer. Recheck SCC of the left lateral upper pretibia treated last visit with EDC. History of multiple SCCs and BCC of the R paranasal.    The following portions of the chart were reviewed this encounter and updated as appropriate: medications, allergies, medical history  Review of Systems:  No other skin or systemic complaints except as noted in HPI or Assessment and Plan.  Objective  Well appearing patient in no apparent distress; mood and affect are within normal limits.  A focused examination was performed of the following areas: Face, arms, legs  Relevant exam findings are noted in the Assessment and Plan.  L neck x 1, L upper arm x 1, L antihelix x 1, central upper lip at vermilion x 1, R cheek x 8, upper nasal dorsum x 1, R temple x 1, L forehead x 4, L cheek x 4, L infraocular x 1, L upper lip x 1, R hand dorsum x 3 (27) Pink scaly papules of the left neck, left upper arm, central upper lip at vermilion; pink scaly macules of the right face.    Assessment & Plan   AK (actinic keratosis) (27) L neck x 1, L upper arm x 1, L antihelix x 1, central upper lip at vermilion x 1, R cheek x 8, upper nasal dorsum x 1, R temple x 1, L forehead x 4, L cheek x 4, L infraocular x 1, L upper lip x 1, R hand dorsum x 3  Actinic keratoses are precancerous spots that appear secondary to cumulative UV radiation exposure/sun exposure over time. They are chronic with expected duration over 1 year. A portion of actinic keratoses will progress to squamous cell carcinoma of the skin. It is not possible to reliably predict which spots will progress to skin cancer and so  treatment is recommended to prevent development of skin cancer.  Recommend daily broad spectrum sunscreen SPF 30+ to sun-exposed areas, reapply every 2 hours as needed.  Recommend staying in the shade or wearing long sleeves, sun glasses (UVA+UVB protection) and wide brim hats (4-inch brim around the entire circumference of the hat). Call for new or changing lesions.  Destruction of lesion - L neck x 1, L upper arm x 1, L antihelix x 1, central upper lip at vermilion x 1, R cheek x 8, upper nasal dorsum x 1, R temple x 1, L forehead x 4, L cheek x 4, L infraocular x 1, L upper lip x 1, R hand dorsum x 3 (27)  Destruction method: cryotherapy   Informed consent: discussed and consent obtained   Lesion destroyed using liquid nitrogen: Yes   Region frozen until ice ball extended beyond lesion: Yes   Outcome: patient tolerated procedure well with no complications   Post-procedure details: wound care instructions given   Additional details:  Prior to procedure, discussed risks of blister formation, small wound, skin dyspigmentation, or rare scar following cryotherapy. Recommend Vaseline ointment to treated areas while healing.    ACTINIC DAMAGE WITH PRECANCEROUS ACTINIC KERATOSES Counseling for Topical Chemotherapy Management: Patient exhibits: - Severe, confluent actinic changes with pre-cancerous actinic keratoses that is secondary to cumulative  UV radiation exposure over time - Condition that is severe; chronic, not at goal. - diffuse scaly erythematous macules and papules with underlying dyspigmentation - Discussed Prescription "Field Treatment" topical Chemotherapy for Severe, Chronic Confluent Actinic Changes with Pre-Cancerous Actinic Keratoses Field treatment involves treatment of an entire area of skin that has confluent Actinic Changes (Sun/ Ultraviolet light damage) and PreCancerous Actinic Keratoses by method of PhotoDynamic Therapy (PDT) and/or prescription Topical Chemotherapy agents  such as 5-fluorouracil, 5-fluorouracil/calcipotriene, and/or imiquimod.  The purpose is to decrease the number of clinically evident and subclinical PreCancerous lesions to prevent progression to development of skin cancer by chemically destroying early precancer changes that may or may not be visible.  It has been shown to reduce the risk of developing skin cancer in the treated area. As a result of treatment, redness, scaling, crusting, and open sores may occur during treatment course. One or more than one of these methods may be used and may have to be used several times to control, suppress and eliminate the PreCancerous changes. Discussed treatment course, expected reaction, and possible side effects. - Recommend daily broad spectrum sunscreen SPF 30+ to sun-exposed areas, reapply every 2 hours as needed.  - Staying in the shade or wearing long sleeves, sun glasses (UVA+UVB protection) and wide brim hats (4-inch brim around the entire circumference of the hat) are also recommended. - Call for new or changing lesions. - Pt interested in doing TCA peel which he has had with Dr. Jorja Loa, which worked well. He has had one red light treatment which worked temporarily. Plan 20% TCA peel to the entire face.   HISTORY OF SQUAMOUS CELL CARCINOMA OF THE SKIN L lat upper pretibia, [facility] 08/16/23, healing site, L medial cheek- clear with bx only 4/24 - No evidence of recurrence today, crusted healing EDC site, discussed wound care - Recommend regular full body skin exams - Recommend daily broad spectrum sunscreen SPF 30+ to sun-exposed areas, reapply every 2 hours as needed.  - Call if any new or changing lesions are noted between office visits  HISTORY OF BASAL CELL CARCINOMA OF THE SKIN - No evidence of recurrence today- right paranasal- clear with biopsy only 4/24 - Recommend regular full body skin exams - Recommend daily broad spectrum sunscreen SPF 30+ to sun-exposed areas, reapply every 2 hours as  needed.  - Call if any new or changing lesions are noted between office visits   Return in about 9 weeks (around 11/14/2023) for Dec 9 2:45 PM for TCA Peel (30 mins).  ICherlyn Labella, CMA, am acting as scribe for Willeen Niece, MD .   Documentation: I have reviewed the above documentation for accuracy and completeness, and I agree with the above.  Willeen Niece, MD

## 2023-09-12 NOTE — Patient Instructions (Signed)

## 2023-09-13 DIAGNOSIS — M170 Bilateral primary osteoarthritis of knee: Secondary | ICD-10-CM | POA: Diagnosis not present

## 2023-09-27 DIAGNOSIS — M170 Bilateral primary osteoarthritis of knee: Secondary | ICD-10-CM | POA: Diagnosis not present

## 2023-10-03 ENCOUNTER — Other Ambulatory Visit: Payer: Medicare Other

## 2023-10-03 ENCOUNTER — Ambulatory Visit: Payer: Medicare Other | Admitting: Gastroenterology

## 2023-10-03 ENCOUNTER — Encounter: Payer: Self-pay | Admitting: Gastroenterology

## 2023-10-03 DIAGNOSIS — K5900 Constipation, unspecified: Secondary | ICD-10-CM

## 2023-10-03 DIAGNOSIS — R110 Nausea: Secondary | ICD-10-CM

## 2023-10-03 MED ORDER — FAMOTIDINE 20 MG PO TABS: 20.0000 mg | ORAL_TABLET | Freq: Two times a day (BID) | ORAL | Status: DC

## 2023-10-03 NOTE — Progress Notes (Unsigned)
HPI : Todd Gonzales is a 82 y.o. male with a history of a-fib and numerous squamous cell skin cancers who presents to our office for follow up of nausea and constipation.  He was last seen in our office in June 2023 by Alcide Evener with these same symptoms.  At that time, it was recommended he treat his constipation with MiraLax and Benefiber.  His nausea was thought to be possibly related to acid dyspepsia/gastritis and he was prescribed Pepcid daily. Today, he reports that his symptoms had improved but seemed to have returned over the past several months.    He reports having bouts of nausea on a near daily basis and they occur most often after breakfast.  The nausea will last a few hours and the subside.  He denies any problems with abdominal pain, but notes a 'rumbling stomach'.  He does not vomit.  Nausea typically is noted about 30 minutes after eating.  He does not eat the same thing for breakfast, and notes that the nausea does not vary much with what he eats (reports having Cheerios this morning and having similar nausea to when he ate [place] sausage the day prior).  No nocturnal symptoms.  He denies heartburn or acid regurgitation. He reports a good appetite, denies early satiety. He doesn't recall if taking Pepcid helped, but his wife says he never took it because he was tired of taking all his various medications.  He continues to have constipation manifested as going a few days without a bowel movement, having small/unsatisfactory stools, but will also have bouts of diarrhea.  He has been taking MiraLax most days, but ran out of his Benefiber a few months ago.  No blood in the stool.    His weight has fluctuated, but overall has been stable.  He and his wife report being under significant stress lately, as they are planning to move to [facility], but they are having difficulty selling their house (has been on the market for 7 months)  He had a CT in March  of this year to evaluate abdominal pain and weight loss which showed diffuse colonic stool burden but no other abnormalities to explain his symptoms.  A RUQUS in July 2023 showed fatty liver but no other abnormalities.   CT Abdomen/Pelvis March 2024 IMPRESSION: 1. No acute intra-abdominal or pelvic pathology. 2. Sigmoid diverticulosis. No bowel obstruction. Normal appendix. 3. Mild fatty liver. 4.  Aortic Atherosclerosis (ICD10-I70.0).   RUQUS July 2023 IMPRESSION: 1. Increased hepatic echogenicity compatible with hepatic steatosis. No focal hepatic abnormalities noted. 2. Unremarkable gallbladder.  No evidence of biliary dilatation.  Past Medical History:  Diagnosis Date   Actinic keratosis 03/28/2023   glabella, needs LN2   Aortic atherosclerosis (HCC)    a. Noted on CT 02/2023.   Arthritis    Basal cell carcinoma 03/28/2023   right paranasal, no tx, cleared with bx   BPH (benign prostatic hyperplasia)    nocturia x4 at baseline   Coronary artery calcification seen on CT scan    H/O hiatal hernia    Hemorrhoids    History of echocardiogram    a. 04/2023 Echo: EF 60-65%, no rwma, mild LVH, nl RV fxn, triv MR.   Hyperlipidemia    Hypertension    Dr Wylene Simmer- LOV with clearance 3/13 on chart   IGT (impaired glucose tolerance)    last AIC 5.5- diet controlled- per office note 3/13   Paroxysmal atrial fibrillation (HCC) 04/14/2023  a. 04/2023 Admitted with A-fib RVR.  Normal echo.  Discharged on amiodarone; b. 05/2023 Zio: Predominantly sinus rhythm at 66 (50-88).  First-degree AV block.  4 beats of NSVT.  No sustained arrhythmias.  Amiodarone discontinued.   SCC (squamous cell carcinoma) 02/10/2005   scc in situ-left forearm.   SCCA (squamous cell carcinoma) of skin 02/10/2005   Right Outer Eye (in situ) (curet and excision)   SCCA (squamous cell carcinoma) of skin 02/13/2006   Right Ear (well diff) (curet and 5FU)   SCCA (squamous cell carcinoma) of skin 04/05/2006   Left  Ear Bowl (in situ) (tx p bx)   SCCA (squamous cell carcinoma) of skin 07/06/2010   Right Cheek (in situ) (curet and zyclara)   SCCA (squamous cell carcinoma) of skin 04/27/2009   Left Upper Forearm (in situ) (curet 5FU)   SCCA (squamous cell carcinoma) of skin 08/28/2014   Right Cheek (well diff)   SCCA (squamous cell carcinoma) of skin 03/21/2016   Left Jawline Ant (well diff) (curet and 5FU)   SCCA (squamous cell carcinoma) of skin 01/19/2017   Right Hand (in situ) (tx p bx)   SCCA (squamous cell carcinoma) of skin 03/04/1840   Left Jawline Inf (in situ) (curet and 5FU)   SCCA (squamous cell carcinoma) of skin 12/23/2020   Left Forehead (well diff) (tx p bx)   SCCA (squamous cell carcinoma) of skin 12/23/2020   Left Submandibular Area (in situ) (tx p bx)   Seasonal allergies    Spinal stenosis    Squamous cell carcinoma of skin 08/31/2020   in situ-left jawline cheek   Squamous cell carcinoma of skin 04/27/2021   infil- left nasal sidewall(MOHS)   Squamous cell carcinoma of skin 03/28/2023   Left medial cheek above nasolabial, not treated, but cleared with bx   Squamous cell carcinoma of skin 08/16/2023   Left lateral upper pretibia, EDC   Squamous cell carcinoma of skin of face 04/27/2021   infil- Left Temporal scalp (MOHS   UTI (lower urinary tract infection)    required admission to [facility]     Past Surgical History:  Procedure Laterality Date   BACK SURGERY  2008   Laminectomy L4-5   JOINT REPLACEMENT Bilateral    bil   KNEE ARTHROSCOPY     right   LUMBAR LAMINECTOMY/DECOMPRESSION MICRODISCECTOMY Bilateral 11/20/2018   Procedure: Bilateral Thoracic Twelve to Lumbar One Laminectomy;  Surgeon: Barnett Abu, MD;  Location: [facility] OR;  Service: Neurosurgery;  Laterality: Bilateral;  posterior   TOTAL HIP ARTHROPLASTY  03/20/2012   Procedure: TOTAL HIP ARTHROPLASTY ANTERIOR APPROACH;  Surgeon: Shelda Pal, MD;  Location: [facility] ORS;  Service: Orthopedics;  Laterality: Left;    Family History  Problem Relation Age of Onset   Arthritis Mother    Lung cancer Father    Asthma Father    Colon cancer Neg Hx    Prostate cancer Neg Hx    Social History   Tobacco Use   Smoking status: Never   Smokeless tobacco: Never  Vaping Use   Vaping status: Never Used  Substance Use Topics   Alcohol use: Yes    Comment: socially, wine   Drug use: No   Current Outpatient Medications  Medication Sig Dispense Refill   acetaminophen (TYLENOL) 650 MG CR tablet Take 1,300 mg by mouth 2 (two) times daily as needed for pain.     Camphor-Menthol-Methyl Sal (SALONPAS EX) Apply 1 patch topically daily as needed (pain).  Cholecalciferol (VITAMIN D-3 PO) Take 1 capsule by mouth daily.     cyanocobalamin (VITAMIN B12) 1000 MCG/ML injection Inject 1 mL (1,000 mcg total) into the muscle every 30 (thirty) days.     diclofenac Sodium (VOLTAREN) 1 % GEL Apply 1 Application topically every 6 (six) hours as needed (pain).     ELIQUIS 5 MG TABS tablet TAKE 1 TABLET BY MOUTH TWICE A DAY 60 tablet 2   furosemide (LASIX) 20 MG tablet Take 1 tablet (20 mg total) by mouth daily. (Patient taking differently: Take 20 mg by mouth daily. prn)     gabapentin (NEURONTIN) 300 MG capsule TAKE ONE CAPSULE BY MOUTH ONCE DAILY 90 capsule 1   hydrocortisone (PROCTOSOL HC) 2.5 % rectal cream Place 1 application. rectally 2 (two) times daily. 30 g 1   mometasone (ELOCON) 0.1 % cream Spot treat thicker areas on lower legs once to twice daily until improved. Avoid face, groin, axilla. 45 g 1   ondansetron (ZOFRAN) 4 MG tablet Take 1 tablet (4 mg total) by mouth every 8 (eight) hours as needed for nausea or vomiting. 20 tablet 0   OVER THE COUNTER MEDICATION Place 1-2 sprays into the nose daily as needed (allergies, nasal congestion). Unknown nasal spray.     polyethylene glycol (MIRALAX / GLYCOLAX) 17 g packet Take 17 g by mouth daily as needed for mild constipation. 14 each 0   tamsulosin (FLOMAX) 0.4 MG CAPS  capsule TAKE TWO CAPSULE BY MOUTH DAILY 60 capsule 3   metoprolol tartrate (LOPRESSOR) 25 MG tablet Take 1 tablet (25 mg total) by mouth 2 (two) times daily. 60 tablet 3   No current facility-administered medications for this visit.   Allergies  Allergen Reactions   Codeine Other (See Comments)    Unknown reaction   Lipitor [Atorvastatin] Other (See Comments)    Arthralgias    Bactrim [Sulfamethoxazole-Trimethoprim] Rash     Review of Systems: All systems reviewed and negative except where noted in HPI.    No results found.  Physical Exam: BP 102/60   Pulse 82   Wt 241 lb (109.3 kg)   BMI 34.58 kg/m  Constitutional: Pleasant,well-developed, Caucasian male in no acute distress.  Accompanied by wife HEENT: Normocephalic and atraumatic. Conjunctivae are normal. No scleral icterus. Neck supple.  Cardiovascular: Normal rate, regular rhythm.  Pulmonary/chest: Effort normal and breath sounds normal. No wheezing, rales or rhonchi. Abdominal: Soft, nondistended, nontender. Bowel sounds active throughout. There are no masses palpable. No hepatomegaly. Neurological: Alert and oriented to person place and time. Skin: Skin is warm and dry. No rashes noted.  Numerous scars from resected SCSCs Psychiatric: Normal mood and affect. Behavior is normal.  CBC    Component Value Date/Time   WBC 8.3 04/16/2023 0242   RBC 3.92 (L) 04/16/2023 0242   RBC 3.96 (L) 04/16/2023 0242   HGB 12.9 (L) 04/16/2023 0242   HGB 14.1 06/12/2013 0409   HCT 39.8 04/16/2023 0242   HCT 40.5 06/12/2013 0409   PLT 137 (L) 04/16/2023 0242   PLT 138 (L) 06/12/2013 0409   MCV 101.5 (H) 04/16/2023 0242   MCV 94 06/12/2013 0409   MCH 32.9 04/16/2023 0242   MCHC 32.4 04/16/2023 0242   RDW 13.3 04/16/2023 0242   RDW 13.7 06/12/2013 0409   LYMPHSABS 1.0 04/14/2023 1420   LYMPHSABS 0.6 (L) 06/12/2013 0409   MONOABS 0.9 04/14/2023 1420   MONOABS 1.0 06/12/2013 0409   EOSABS 0.0 04/14/2023 1420   EOSABS 0.0  06/12/2013 0409   BASOSABS 0.0 04/14/2023 1420   BASOSABS 0.0 06/12/2013 0409    CMP     Component Value Date/Time   NA 144 08/04/2023 1201   NA 143 06/11/2013 1519   K 4.3 08/04/2023 1201   K 3.4 (L) 06/12/2013 0409   CL 100 08/04/2023 1201   CL 106 06/11/2013 1519   CO2 27 08/04/2023 1201   CO2 27 06/11/2013 1519   GLUCOSE 89 08/04/2023 1201   GLUCOSE 95 07/13/2023 1258   GLUCOSE 127 (H) 06/11/2013 1519   BUN 14 08/04/2023 1201   BUN 15 06/11/2013 1519   CREATININE 0.85 08/04/2023 1201   CREATININE 0.96 06/01/2022 1453   CALCIUM 9.1 08/04/2023 1201   CALCIUM 8.5 06/11/2013 1519   PROT 6.0 (L) 04/15/2023 0220   PROT 6.9 06/11/2013 1519   ALBUMIN 3.5 04/15/2023 0220   ALBUMIN 3.4 06/11/2013 1519   AST 24 04/15/2023 0220   AST 22 08/25/2016 0000   ALT 37 04/15/2023 0220   ALT 23 08/25/2016 0000   ALT 26 06/11/2013 1519   ALKPHOS 38 04/15/2023 0220   ALKPHOS 64 06/11/2013 1519   BILITOT 1.5 (H) 04/15/2023 0220   BILITOT 2.8 (H) 06/11/2013 1519   GFRNONAA >60 04/16/2023 0242   GFRNONAA >60 06/11/2013 1519   GFRAA >60 11/30/2018 1127   GFRAA >60 06/11/2013 1519       Latest Ref Rng & Units 04/16/2023    2:42 AM 04/15/2023    2:20 AM 04/14/2023    2:42 PM  CBC EXTENDED  WBC 4.0 - 10.5 K/uL 8.3  8.7    RBC 4.22 - 5.81 MIL/uL 4.22 - 5.81 MIL/uL 3.92    3.96  4.03    Hemoglobin 13.0 - 17.0 g/dL 96.0  45.4  09.8   HCT 39.0 - 52.0 % 39.8  41.1  43.0   Platelets 150 - 400 K/uL 137  141        ASSESSMENT AND PLAN: 82 year old male with a-fib with chronic nausea and constipation and diarrhea.  No abdominal pain or vomiting.  No blood in stool.  His weight has been variable and is most likely related to fluid retention (mild diastolic CHF and venous stasis) but he has not had any significant sustained weight loss. Multiple possible reasons for nausea to include chronic constipation, stress, or acid dyspepsia.  Will check for H. Pylori.  Offered to try PPI, but he said he  would just try the Pepcid already prescribed first. For his constipation, I recommended he try taking Metamucil on a daily basis to improve his stool bulk and consistency, and to take either MiraLax or senna on an as needed basis when he has not had a satisfactory stool in 2 days. If H. Pylori negative and nausea symptoms not improving, can consider a therapeutic bowel prep.     Nausea - H pylori stool antigen - Pepcid daily - Consider therapeutic bowel prep if no improvement.  Constipation - Metamucil daily - PRN MiraLax or Senna  Genese Quebedeaux E. Tomasa Rand, MD [facility]   Joaquim Nam, MD

## 2023-10-03 NOTE — Patient Instructions (Addendum)
_______________________________________________________  If your blood pressure at your visit was 140/90 or greater, please contact your primary care physician to follow up on this. _______________________________________________________  If you are age 84 or older, your body mass index should be between 23-30. Your Body mass index is 34.58 kg/m. If this is out of the aforementioned range listed, please consider follow up with your Primary Care Provider. ________________________________________________________  The [facility] providers would like to encourage you to use [facility] to communicate with providers for non-urgent requests or questions.  Due to long hold times on the telephone, sending your provider a message by [facility] may be a faster and more efficient way to get a response.  Please allow 48 business hours for a response.  Please remember that this is for non-urgent requests.  _______________________________________________________  Your provider has requested that you go to the basement level for lab work before leaving today. Press "B" on the elevator. The lab is located at the first door on the left as you exit the elevator.  Due to recent changes in healthcare laws, you may see the results of your imaging and laboratory studies on MyChart before your provider has had a chance to review them.  We understand that in some cases there may be results that are confusing or concerning to you. Not all laboratory results come back in the same time frame and the provider may be waiting for multiple results in order to interpret others.  Please give [place] 48 hours in order for your provider to thoroughly review all the results before contacting the office for clarification of your results.   Please purchase the following medications over the counter and take as directed:  START: Pepcid 20mg  one tablet daily   START: Metamucil daily USE: Senna Lax or Miralax as needed to help with  constipation.  Thank you for entrusting me with your care and choosing [facility].  Dr Tomasa Rand

## 2023-10-04 DIAGNOSIS — M170 Bilateral primary osteoarthritis of knee: Secondary | ICD-10-CM | POA: Diagnosis not present

## 2023-10-11 DIAGNOSIS — M170 Bilateral primary osteoarthritis of knee: Secondary | ICD-10-CM | POA: Diagnosis not present

## 2023-10-20 ENCOUNTER — Ambulatory Visit (INDEPENDENT_AMBULATORY_CARE_PROVIDER_SITE_OTHER): Payer: Medicare Other

## 2023-10-20 DIAGNOSIS — E538 Deficiency of other specified B group vitamins: Secondary | ICD-10-CM | POA: Diagnosis not present

## 2023-10-20 MED ORDER — CYANOCOBALAMIN 1000 MCG/ML IJ SOLN
1000.0000 ug | Freq: Once | INTRAMUSCULAR | Status: AC
Start: 2023-10-20 — End: 2023-10-20
  Administered 2023-10-20: 1000 ug via INTRAMUSCULAR

## 2023-10-20 NOTE — Progress Notes (Signed)
Patient presented for B 12 injection given by Rhylan Kagel, CMA to left deltoid, patient voiced no concerns nor showed any signs of distress during injection.  

## 2023-10-24 ENCOUNTER — Other Ambulatory Visit: Payer: Self-pay | Admitting: Family Medicine

## 2023-10-24 NOTE — Telephone Encounter (Signed)
LAST OFFICE VISIT: 07/28/23 NEXT OFFICE VISIT: nothing scheduled  LAST REFILL: 4/28/24GABAPENTIN 300MG  CAPSULE

## 2023-10-30 ENCOUNTER — Telehealth: Payer: Self-pay | Admitting: Family Medicine

## 2023-10-30 ENCOUNTER — Telehealth: Payer: Self-pay

## 2023-10-30 NOTE — Telephone Encounter (Signed)
Left pt msg to call about upcoming TCA Peel.  Need to discuss prophylactic valtrex and send in.  Also need to advise pt to bring a hat to wear when he leaves appointment./sh

## 2023-10-30 NOTE — Telephone Encounter (Signed)
Pt wife called in and made appointment for pt I ask her what  would pt like to be seen for and wife stated he sick what more do I have to say I feel like he going to kick the bucked I transfer her to Access Nurse pt has appointment 11/26 with Dr Para March

## 2023-10-30 NOTE — Telephone Encounter (Signed)
If possible, please see what extra details anyone can get about this patient, in case he has emergent sx that can't wait until tomorrow.  Thanks.

## 2023-10-30 NOTE — Telephone Encounter (Signed)
When access nurse spoke to patient they refused to be seen any sooner. Will come in office tomorrow.

## 2023-10-30 NOTE — Telephone Encounter (Signed)
Noted. Thanks.

## 2023-10-31 ENCOUNTER — Ambulatory Visit: Payer: Medicare Other | Admitting: Family Medicine

## 2023-10-31 ENCOUNTER — Encounter: Payer: Self-pay | Admitting: Family Medicine

## 2023-10-31 ENCOUNTER — Other Ambulatory Visit: Payer: Self-pay | Admitting: Family Medicine

## 2023-10-31 VITALS — BP 110/70 | HR 86 | Temp 98.3°F | Ht 70.0 in | Wt 240.0 lb

## 2023-10-31 DIAGNOSIS — M1990 Unspecified osteoarthritis, unspecified site: Secondary | ICD-10-CM

## 2023-10-31 DIAGNOSIS — R399 Unspecified symptoms and signs involving the genitourinary system: Secondary | ICD-10-CM | POA: Diagnosis not present

## 2023-10-31 LAB — URINALYSIS, ROUTINE W REFLEX MICROSCOPIC
Bilirubin Urine: NEGATIVE
Hgb urine dipstick: NEGATIVE
Ketones, ur: NEGATIVE
Leukocytes,Ua: NEGATIVE
Nitrite: NEGATIVE
RBC / HPF: NONE SEEN (ref 0–?)
Specific Gravity, Urine: 1.01 (ref 1.000–1.030)
Urine Glucose: NEGATIVE
Urobilinogen, UA: 0.2 (ref 0.0–1.0)
pH: 6 (ref 5.0–8.0)

## 2023-10-31 MED ORDER — FUROSEMIDE 20 MG PO TABS: 20.0000 mg | ORAL_TABLET | Freq: Every day | ORAL | Status: DC | PRN

## 2023-10-31 NOTE — Patient Instructions (Signed)
Give a urine sample on the way out. Update me as needed.  Take care.  Glad to see you.

## 2023-10-31 NOTE — Progress Notes (Unsigned)
Sx going on for about 1 week.  Fatigued.  More LUTS last night.  Had been taking flomax 0.8mg  per day most days but 0.4mg  recently.  Sx predate the flomax change.  No fevers.  No burning with urination.  He prev improved with 0.8mg  flomax dose.   Recently inc L knee pain.  BLE edema is better.  He is using tylenol for pain.  H/o prev injection in L knee.    Not SOB.  No CP.  Recently moved into [facility].  Stressors with that d/w pt. He has help coming in to help with the unpacking.  Unfortunately he couldn't locate all of his meds with the move and led to recent dec in flomax use.  He is going to go home and look for his medications after the office visit.  Meds, vitals, and allergies reviewed.   ROS: Per HPI unless specifically indicated in ROS section   [place] Neck supple, no LA IRR not tachy Abdomen not tender. No CVA pain. Trace BLE edema.  Skin well-perfused. L knee puffy and warm but not red.  Had chronic deg changes at baseline on inspection.  Crepitus on ROM.

## 2023-10-31 NOTE — Telephone Encounter (Signed)
Patient was seen in office today. Ok to refill?

## 2023-11-01 DIAGNOSIS — R399 Unspecified symptoms and signs involving the genitourinary system: Secondary | ICD-10-CM | POA: Insufficient documentation

## 2023-11-01 LAB — URINE CULTURE
MICRO NUMBER:: 15782003
Result:: NO GROWTH
SPECIMEN QUALITY:: ADEQUATE

## 2023-11-01 NOTE — Assessment & Plan Note (Signed)
Overall benign exam but he is tired with having to move into [facility].  He is going to go home and look to find his medications and restart Flomax twice a day.  At this point okay for outpatient follow-up.  Will check urinalysis and urine culture today.  See notes on labs.  Update me as needed.

## 2023-11-01 NOTE — Assessment & Plan Note (Signed)
He can follow-up with orthopedics.

## 2023-11-01 NOTE — Telephone Encounter (Signed)
Sent. Thanks.   

## 2023-11-10 ENCOUNTER — Encounter: Payer: Self-pay | Admitting: Family Medicine

## 2023-11-10 ENCOUNTER — Other Ambulatory Visit: Payer: Self-pay | Admitting: Family Medicine

## 2023-11-13 ENCOUNTER — Ambulatory Visit: Payer: Medicare Other | Admitting: Dermatology

## 2023-11-13 NOTE — Telephone Encounter (Signed)
Left pt another message about today's T[facility] peel appointment.  Need to discuss prophylactic Valtrex./sh

## 2023-11-17 DIAGNOSIS — M170 Bilateral primary osteoarthritis of knee: Secondary | ICD-10-CM | POA: Diagnosis not present

## 2023-11-20 ENCOUNTER — Other Ambulatory Visit: Payer: Self-pay | Admitting: Cardiology

## 2023-11-21 ENCOUNTER — Ambulatory Visit: Payer: Medicare Other

## 2023-12-20 ENCOUNTER — Ambulatory Visit: Payer: Self-pay | Admitting: Family Medicine

## 2023-12-20 ENCOUNTER — Other Ambulatory Visit: Payer: Self-pay

## 2023-12-20 ENCOUNTER — Emergency Department: Payer: Medicare Other

## 2023-12-20 ENCOUNTER — Inpatient Hospital Stay
Admission: EM | Admit: 2023-12-20 | Discharge: 2024-01-08 | DRG: 291 | Disposition: A | Discharge status: Home Health | Payer: Medicare Other | Source: Skilled Nursing Facility | Attending: Hospitalist | Admitting: Hospitalist

## 2023-12-20 DIAGNOSIS — Z7901 Long term (current) use of anticoagulants: Secondary | ICD-10-CM | POA: Diagnosis not present

## 2023-12-20 DIAGNOSIS — T501X6A Underdosing of loop [high-ceiling] diuretics, initial encounter: Secondary | ICD-10-CM | POA: Diagnosis present

## 2023-12-20 DIAGNOSIS — Z6834 Body mass index (BMI) 34.0-34.9, adult: Secondary | ICD-10-CM | POA: Diagnosis not present

## 2023-12-20 DIAGNOSIS — I2489 Other forms of acute ischemic heart disease: Secondary | ICD-10-CM | POA: Diagnosis not present

## 2023-12-20 DIAGNOSIS — G992 Myelopathy in diseases classified elsewhere: Secondary | ICD-10-CM

## 2023-12-20 DIAGNOSIS — E8729 Other acidosis: Secondary | ICD-10-CM | POA: Diagnosis not present

## 2023-12-20 DIAGNOSIS — J9621 Acute and chronic respiratory failure with hypoxia: Secondary | ICD-10-CM | POA: Diagnosis not present

## 2023-12-20 DIAGNOSIS — I959 Hypotension, unspecified: Secondary | ICD-10-CM | POA: Diagnosis not present

## 2023-12-20 DIAGNOSIS — I25118 Atherosclerotic heart disease of native coronary artery with other forms of angina pectoris: Secondary | ICD-10-CM | POA: Diagnosis not present

## 2023-12-20 DIAGNOSIS — Z96642 Presence of left artificial hip joint: Secondary | ICD-10-CM | POA: Diagnosis present

## 2023-12-20 DIAGNOSIS — Z1152 Encounter for screening for COVID-19: Secondary | ICD-10-CM | POA: Diagnosis not present

## 2023-12-20 DIAGNOSIS — Z85828 Personal history of other malignant neoplasm of skin: Secondary | ICD-10-CM

## 2023-12-20 DIAGNOSIS — R0902 Hypoxemia: Secondary | ICD-10-CM

## 2023-12-20 DIAGNOSIS — R7989 Other specified abnormal findings of blood chemistry: Secondary | ICD-10-CM | POA: Diagnosis not present

## 2023-12-20 DIAGNOSIS — Z881 Allergy status to other antibiotic agents status: Secondary | ICD-10-CM

## 2023-12-20 DIAGNOSIS — E876 Hypokalemia: Secondary | ICD-10-CM | POA: Diagnosis present

## 2023-12-20 DIAGNOSIS — Z888 Allergy status to other drugs, medicaments and biological substances status: Secondary | ICD-10-CM

## 2023-12-20 DIAGNOSIS — I10 Essential (primary) hypertension: Secondary | ICD-10-CM | POA: Diagnosis present

## 2023-12-20 DIAGNOSIS — N400 Enlarged prostate without lower urinary tract symptoms: Secondary | ICD-10-CM | POA: Diagnosis not present

## 2023-12-20 DIAGNOSIS — R351 Nocturia: Secondary | ICD-10-CM | POA: Diagnosis present

## 2023-12-20 DIAGNOSIS — J9610 Chronic respiratory failure, unspecified whether with hypoxia or hypercapnia: Secondary | ICD-10-CM | POA: Diagnosis not present

## 2023-12-20 DIAGNOSIS — I480 Paroxysmal atrial fibrillation: Secondary | ICD-10-CM | POA: Diagnosis not present

## 2023-12-20 DIAGNOSIS — I509 Heart failure, unspecified: Secondary | ICD-10-CM | POA: Diagnosis not present

## 2023-12-20 DIAGNOSIS — I5033 Acute on chronic diastolic (congestive) heart failure: Secondary | ICD-10-CM

## 2023-12-20 DIAGNOSIS — Z79899 Other long term (current) drug therapy: Secondary | ICD-10-CM

## 2023-12-20 DIAGNOSIS — R7302 Impaired glucose tolerance (oral): Secondary | ICD-10-CM | POA: Diagnosis present

## 2023-12-20 DIAGNOSIS — L89320 Pressure ulcer of left buttock, unstageable: Secondary | ICD-10-CM | POA: Diagnosis not present

## 2023-12-20 DIAGNOSIS — I340 Nonrheumatic mitral (valve) insufficiency: Secondary | ICD-10-CM | POA: Diagnosis not present

## 2023-12-20 DIAGNOSIS — I2510 Atherosclerotic heart disease of native coronary artery without angina pectoris: Secondary | ICD-10-CM | POA: Diagnosis not present

## 2023-12-20 DIAGNOSIS — Z91148 Patient's other noncompliance with medication regimen for other reason: Secondary | ICD-10-CM

## 2023-12-20 DIAGNOSIS — I440 Atrioventricular block, first degree: Secondary | ICD-10-CM | POA: Diagnosis present

## 2023-12-20 DIAGNOSIS — R0602 Shortness of breath: Secondary | ICD-10-CM | POA: Diagnosis not present

## 2023-12-20 DIAGNOSIS — L820 Inflamed seborrheic keratosis: Secondary | ICD-10-CM

## 2023-12-20 DIAGNOSIS — I4819 Other persistent atrial fibrillation: Secondary | ICD-10-CM | POA: Diagnosis not present

## 2023-12-20 DIAGNOSIS — I110 Hypertensive heart disease with heart failure: Principal | ICD-10-CM | POA: Diagnosis present

## 2023-12-20 DIAGNOSIS — Z8261 Family history of arthritis: Secondary | ICD-10-CM

## 2023-12-20 DIAGNOSIS — R000 Tachycardia, unspecified: Secondary | ICD-10-CM | POA: Diagnosis not present

## 2023-12-20 DIAGNOSIS — J9601 Acute respiratory failure with hypoxia: Secondary | ICD-10-CM | POA: Diagnosis present

## 2023-12-20 DIAGNOSIS — E785 Hyperlipidemia, unspecified: Secondary | ICD-10-CM | POA: Diagnosis present

## 2023-12-20 DIAGNOSIS — R001 Bradycardia, unspecified: Secondary | ICD-10-CM | POA: Diagnosis not present

## 2023-12-20 DIAGNOSIS — I5032 Chronic diastolic (congestive) heart failure: Secondary | ICD-10-CM

## 2023-12-20 DIAGNOSIS — Z885 Allergy status to narcotic agent status: Secondary | ICD-10-CM

## 2023-12-20 DIAGNOSIS — K5900 Constipation, unspecified: Secondary | ICD-10-CM | POA: Diagnosis not present

## 2023-12-20 DIAGNOSIS — Z882 Allergy status to sulfonamides status: Secondary | ICD-10-CM

## 2023-12-20 DIAGNOSIS — Z87440 Personal history of urinary (tract) infections: Secondary | ICD-10-CM

## 2023-12-20 DIAGNOSIS — M1990 Unspecified osteoarthritis, unspecified site: Secondary | ICD-10-CM | POA: Diagnosis present

## 2023-12-20 DIAGNOSIS — J9622 Acute and chronic respiratory failure with hypercapnia: Secondary | ICD-10-CM | POA: Diagnosis not present

## 2023-12-20 DIAGNOSIS — I4892 Unspecified atrial flutter: Secondary | ICD-10-CM | POA: Diagnosis present

## 2023-12-20 DIAGNOSIS — I4891 Unspecified atrial fibrillation: Secondary | ICD-10-CM | POA: Diagnosis present

## 2023-12-20 DIAGNOSIS — J9811 Atelectasis: Secondary | ICD-10-CM | POA: Diagnosis not present

## 2023-12-20 DIAGNOSIS — I361 Nonrheumatic tricuspid (valve) insufficiency: Secondary | ICD-10-CM | POA: Diagnosis not present

## 2023-12-20 DIAGNOSIS — I2583 Coronary atherosclerosis due to lipid rich plaque: Secondary | ICD-10-CM | POA: Diagnosis not present

## 2023-12-20 DIAGNOSIS — I5023 Acute on chronic systolic (congestive) heart failure: Secondary | ICD-10-CM | POA: Diagnosis not present

## 2023-12-20 DIAGNOSIS — E66811 Obesity, class 1: Secondary | ICD-10-CM | POA: Diagnosis not present

## 2023-12-20 DIAGNOSIS — I700 Atherosclerosis of aorta: Secondary | ICD-10-CM | POA: Diagnosis not present

## 2023-12-20 DIAGNOSIS — M48061 Spinal stenosis, lumbar region without neurogenic claudication: Secondary | ICD-10-CM

## 2023-12-20 DIAGNOSIS — L570 Actinic keratosis: Secondary | ICD-10-CM | POA: Diagnosis present

## 2023-12-20 LAB — PROTIME-INR
INR: 1.3 — ABNORMAL HIGH (ref 0.8–1.2)
Prothrombin Time: 16.8 s — ABNORMAL HIGH (ref 11.4–15.2)

## 2023-12-20 LAB — COMPREHENSIVE METABOLIC PANEL
ALT: 30 U/L (ref 0–44)
AST: 21 U/L (ref 15–41)
Albumin: 3.7 g/dL (ref 3.5–5.0)
Alkaline Phosphatase: 47 U/L (ref 38–126)
Anion gap: 9 (ref 5–15)
BUN: 19 mg/dL (ref 8–23)
CO2: 28 mmol/L (ref 22–32)
Calcium: 8.9 mg/dL (ref 8.9–10.3)
Chloride: 103 mmol/L (ref 98–111)
Creatinine, Ser: 1.18 mg/dL (ref 0.61–1.24)
GFR, Estimated: >60 mL/min (ref >60)
Glucose, Bld: 137 mg/dL — ABNORMAL HIGH (ref 70–99)
Potassium: 4.7 mmol/L (ref 3.5–5.1)
Sodium: 140 mmol/L (ref 135–145)
Total Bilirubin: 1.6 mg/dL — ABNORMAL HIGH (ref 0.0–1.2)
Total Protein: 6.6 g/dL (ref 6.5–8.1)

## 2023-12-20 LAB — CBC WITH DIFFERENTIAL/PLATELET
Abs Immature Granulocytes: 0.04 10*3/uL (ref 0.00–0.07)
Basophils Absolute: 0 10*3/uL (ref 0.0–0.1)
Basophils Relative: 0 %
Eosinophils Absolute: 0 10*3/uL (ref 0.0–0.5)
Eosinophils Relative: 0 %
HCT: 44.8 % (ref 39.0–52.0)
Hemoglobin: 14 g/dL (ref 13.0–17.0)
Immature Granulocytes: 0 %
Lymphocytes Relative: 6 %
Lymphs Abs: 0.6 10*3/uL — ABNORMAL LOW (ref 0.7–4.0)
MCH: 31.8 pg (ref 26.0–34.0)
MCHC: 31.3 g/dL (ref 30.0–36.0)
MCV: 101.8 fL — ABNORMAL HIGH (ref 80.0–100.0)
Monocytes Absolute: 0.9 10*3/uL (ref 0.1–1.0)
Monocytes Relative: 9 %
Neutro Abs: 7.9 10*3/uL — ABNORMAL HIGH (ref 1.7–7.7)
Neutrophils Relative %: 85 %
Platelets: 177 10*3/uL (ref 150–400)
RBC: 4.4 MIL/uL (ref 4.22–5.81)
RDW: 13.8 % (ref 11.5–15.5)
WBC: 9.4 10*3/uL (ref 4.0–10.5)
nRBC: 0 % (ref 0.0–0.2)

## 2023-12-20 LAB — LACTIC ACID, PLASMA
Lactic Acid, Venous: 1.3 mmol/L (ref 0.5–1.9)
Lactic Acid, Venous: 1.4 mmol/L (ref 0.5–1.9)

## 2023-12-20 LAB — TROPONIN I (HIGH SENSITIVITY)
Troponin I (High Sensitivity): 26 ng/L — ABNORMAL HIGH (ref <18)
Troponin I (High Sensitivity): 39 ng/L — ABNORMAL HIGH (ref <18)

## 2023-12-20 LAB — RESP PANEL BY RT-PCR (RSV, FLU A&B, COVID)  RVPGX2
Influenza A by PCR: NEGATIVE
Influenza B by PCR: NEGATIVE
Resp Syncytial Virus by PCR: NEGATIVE
SARS Coronavirus 2 by RT PCR: NEGATIVE

## 2023-12-20 LAB — BLOOD GAS, VENOUS

## 2023-12-20 LAB — BRAIN NATRIURETIC PEPTIDE: B Natriuretic Peptide: 625.5 pg/mL — ABNORMAL HIGH (ref 0.0–100.0)

## 2023-12-20 LAB — D-DIMER, QUANTITATIVE: D-Dimer, Quant: 0.31 ug{FEU}/mL (ref 0.00–0.50)

## 2023-12-20 MED ORDER — FUROSEMIDE 40 MG PO TABS
40.0000 mg | ORAL_TABLET | Freq: Every day | ORAL | Status: DC
  Administered 2023-12-20: 40 mg via ORAL
  Filled 2023-12-20: qty 1

## 2023-12-20 MED ORDER — APIXABAN 5 MG PO TABS
5.0000 mg | ORAL_TABLET | Freq: Two times a day (BID) | ORAL | Status: DC
Start: 2023-12-20 — End: 2024-01-08
  Administered 2023-12-20 – 2024-01-08 (×38): 5 mg via ORAL
  Filled 2023-12-20 (×40): qty 1

## 2023-12-20 MED ORDER — METOPROLOL TARTRATE 5 MG/5ML IV SOLN
INTRAVENOUS | Status: AC
  Administered 2023-12-20: 2.5 mg via INTRAVENOUS
  Filled 2023-12-20: qty 5

## 2023-12-20 MED ORDER — AMIODARONE HCL IN DEXTROSE 360-4.14 MG/200ML-% IV SOLN: 60.0000 mg/h | INTRAVENOUS | Status: DC

## 2023-12-20 MED ORDER — AMIODARONE LOAD VIA INFUSION: 150.0000 mg | Freq: Once | INTRAVENOUS | Status: DC

## 2023-12-20 MED ORDER — AMIODARONE HCL IN DEXTROSE 360-4.14 MG/200ML-% IV SOLN: 30.0000 mg/h | INTRAVENOUS | Status: DC

## 2023-12-20 MED ORDER — DIGOXIN 0.25 MG/ML IJ SOLN
0.2500 mg | INTRAMUSCULAR | Status: AC
  Administered 2023-12-20 (×2): 0.25 mg via INTRAVENOUS
  Filled 2023-12-20 (×2): qty 2

## 2023-12-20 MED ORDER — FUROSEMIDE 10 MG/ML IJ SOLN
40.0000 mg | Freq: Once | INTRAMUSCULAR | Status: AC
  Administered 2023-12-20: 40 mg via INTRAVENOUS
  Filled 2023-12-20: qty 4

## 2023-12-20 MED ORDER — DILTIAZEM LOAD VIA INFUSION
10.0000 mg | Freq: Once | INTRAVENOUS | Status: DC
  Filled 2023-12-20: qty 10

## 2023-12-20 MED ORDER — METOPROLOL TARTRATE 5 MG/5ML IV SOLN
5.0000 mg | Freq: Once | INTRAVENOUS | Status: AC
  Administered 2023-12-20: 5 mg via INTRAVENOUS
  Filled 2023-12-20: qty 5

## 2023-12-20 MED ORDER — METOPROLOL TARTRATE 5 MG/5ML IV SOLN
2.5000 mg | Freq: Once | INTRAVENOUS | Status: AC
Start: 2023-12-20 — End: 2023-12-20

## 2023-12-20 MED ORDER — DILTIAZEM HCL-DEXTROSE 125-5 MG/125ML-% IV SOLN (PREMIX)
5.0000 mg/h | INTRAVENOUS | Status: DC
  Administered 2023-12-20: 5 mg/h via INTRAVENOUS
  Filled 2023-12-20: qty 125

## 2023-12-20 MED ORDER — FUROSEMIDE 10 MG/ML IJ SOLN
40.0000 mg | Freq: Two times a day (BID) | INTRAMUSCULAR | Status: DC
Start: 2023-12-21 — End: 2023-12-21
  Administered 2023-12-21: 40 mg via INTRAVENOUS
  Filled 2023-12-20: qty 4

## 2023-12-20 NOTE — Consult Note (Signed)
 Cardiology Consultation   Patient ID: Todd Gonzales MRN: [id number]; DOB: 1941/06/29  Admit date: 12/20/2023 Date of Consult: 12/20/2023  PCP:  Donnie Galea, MD   [facility] Providers Cardiologist:  Randene Bustard, MD        Patient Profile:   Todd Gonzales is a 83 y.o. male with a hx of paroxysmal atrial fibrillation, hypertension, hyperlipidemia, and reported h/o CAD who is being seen 12/20/2023 for the evaluation of CHF exacerbation and atrial fibrillation with RVR at the request of Dr. Daisey Dryer.  History of Present Illness:   Todd Gonzales was previously hospitalized at [facility] in 04/2023 with dyspnea and fatigue. He was found to be in atrial fibrillation/flutter with PVCs and rapid ventricular rate. He was initially placed on diltiazem  and subsequently amiodarone  with conversion to sinus rhythm. Echo showed normal LV function. He was given a dose of IV Lasix . He was discharged home on Eliquis  and established care with Dr. Addie Holstein 05/2023. Amiodarone  was d/c'd and he was placed on metoprolol  25 mg BID. Subsequent Zio monitoring showed predominantly sinus rhythm with a first degree AV block and average HR 66. No sustained arrhythmias or recurrent atrial fibrillation were noted. Seen in clinic 07/2023 with dyspnea on exertion and LE edema. PCP had started him on Lasix  20 mg daily which was continued. Also ordered CT angiogram for ischemic evaluation. He was most recently seen in clinic 08/31/2023 for routine follow up with Dr. Addie Holstein. Coronary CTA reviewed which showed mild to moderate non-occlusive disease. Patient was experiencing LE edema which was attributed venous stasis. No medication changes were made.   Patient somnolent on exam, wife at bedside to assist with history. Reports shortness of breath worsening over the past 2 days. He lives in an assisted living facility with his wife. She tells me that they do take vitals at home, typically heart rate is low in the 50s bpm. They  have not noted any high heart rates. Notes LE edema for the past 2 days. Prior to that did not have edema therefore he had stopped taking Lasix . Reports intermittent compliance with Lasix . Also tells me he only takes Eliquis  once per day, he is certain of this. Denies chest pain, palpitations, lightheadedness, dizziness, diaphoresis, and nausea.   In the ED, BP 118/79, HR 147, RR 27, SpO2 98% on 4 L O2, T 98.64F. BMP unremarkable aside from glucose 137. CBC unremarkable. BNP 625. Troponin 26>39. Respiratory panel negative. Ddimer negative. Blood gas with findings consistent with respiratory acidosis. CXR with hypoinflation of the lungs with minimal bibasilar subsegmental atelectasis. Found to be in atrial fibrillation with RVR. Given IV metoprolol  with improvement in rates. Also given IV Lasix .   Past Medical History:  Diagnosis Date   Actinic keratosis 03/28/2023   glabella, needs LN2   Aortic atherosclerosis (HCC)    a. Noted on CT 02/2023.   Arthritis    Basal cell carcinoma 03/28/2023   right paranasal, no tx, cleared with bx   BPH (benign prostatic hyperplasia)    nocturia x4 at baseline   Coronary artery calcification seen on CT scan    H/O hiatal hernia    Hemorrhoids    History of echocardiogram    a. 04/2023 Echo: EF 60-65%, no rwma, mild LVH, nl RV fxn, triv MR.   Hyperlipidemia    Hypertension    Dr Tisovec- LOV with clearance 3/13 on chart   IGT (impaired glucose tolerance)    last AIC 5.5- diet controlled- per  office note 3/13   Paroxysmal atrial fibrillation (HCC) 04/14/2023   a. 04/2023 Admitted with A-fib RVR.  Normal echo.  Discharged on amiodarone ; b. 05/2023 Zio: Predominantly sinus rhythm at 66 (50-88).  First-degree AV block.  4 beats of NSVT.  No sustained arrhythmias.  Amiodarone  discontinued.   SCC (squamous cell carcinoma) 02/10/2005   scc in situ-left forearm.   SCCA (squamous cell carcinoma) of skin 02/10/2005   Right Outer Eye (in situ) (curet and excision)    SCCA (squamous cell carcinoma) of skin 02/13/2006   Right Ear (well diff) (curet and 5FU)   SCCA (squamous cell carcinoma) of skin 04/05/2006   Left Ear Bowl (in situ) (tx p bx)   SCCA (squamous cell carcinoma) of skin 07/06/2010   Right Cheek (in situ) (curet and zyclara)   SCCA (squamous cell carcinoma) of skin 04/27/2009   Left Upper Forearm (in situ) (curet 5FU)   SCCA (squamous cell carcinoma) of skin 08/28/2014   Right Cheek (well diff)   SCCA (squamous cell carcinoma) of skin 03/21/2016   Left Jawline Ant (well diff) (curet and 5FU)   SCCA (squamous cell carcinoma) of skin 01/19/2017   Right Hand (in situ) (tx p bx)   SCCA (squamous cell carcinoma) of skin 03/04/1840   Left Jawline Inf (in situ) (curet and 5FU)   SCCA (squamous cell carcinoma) of skin 12/23/2020   Left Forehead (well diff) (tx p bx)   SCCA (squamous cell carcinoma) of skin 12/23/2020   Left Submandibular Area (in situ) (tx p bx)   Seasonal allergies    Spinal stenosis    Squamous cell carcinoma of skin 08/31/2020   in situ-left jawline cheek   Squamous cell carcinoma of skin 04/27/2021   infil- left nasal sidewall(MOHS)   Squamous cell carcinoma of skin 03/28/2023   Left medial cheek above nasolabial, not treated, but cleared with bx   Squamous cell carcinoma of skin 08/16/2023   Left lateral upper pretibia, EDC   Squamous cell carcinoma of skin of face 04/27/2021   infil- Left Temporal scalp (MOHS   UTI (lower urinary tract infection)    required admission to [facility]    Past Surgical History:  Procedure Laterality Date   BACK SURGERY  2008   Laminectomy L4-5   JOINT REPLACEMENT Bilateral    bil   KNEE ARTHROSCOPY     right   LUMBAR LAMINECTOMY/DECOMPRESSION MICRODISCECTOMY Bilateral 11/20/2018   Procedure: Bilateral Thoracic Twelve to Lumbar One Laminectomy;  Surgeon: Elna Haggis, MD;  Location: [facility] OR;  Service: Neurosurgery;  Laterality: Bilateral;  posterior   TOTAL HIP ARTHROPLASTY  03/20/2012    Procedure: TOTAL HIP ARTHROPLASTY ANTERIOR APPROACH;  Surgeon: Bevin Bucks, MD;  Location: [facility] ORS;  Service: Orthopedics;  Laterality: Left;       Inpatient Medications: Scheduled Meds:  apixaban   5 mg Oral BID   furosemide   40 mg Oral Daily   Continuous Infusions:  PRN Meds:   Allergies:    Allergies  Allergen Reactions   Codeine Other (See Comments)    Unknown reaction   Lipitor [Atorvastatin ] Other (See Comments)    Arthralgias    Bactrim [Sulfamethoxazole-Trimethoprim] Rash    Social History:   Social History   Socioeconomic History   Marital status: Married    Spouse name: Not on file   Number of children: 2   Years of education: Not on file   Highest education level: Not on file  Occupational History   Occupation: retired  Tobacco  Use   Smoking status: Never   Smokeless tobacco: Never  Vaping Use   Vaping status: Never Used  Substance and Sexual Activity   Alcohol use: Yes    Comment: socially, wine   Drug use: No   Sexual activity: Not on file  Other Topics Concern   Not on file  Social History Narrative   Plays bridge   [facility] [employer]     2 kids, one local.     [employer] grad.     Married 1962   Prev in Engineer, maintenance (IT), retired.     Social Drivers of Corporate investment banker Strain: Low Risk  (08/08/2023)   Overall Financial Resource Strain (CARDIA)    Difficulty of Paying Living Expenses: Not hard at all  Food Insecurity: No Food Insecurity (08/08/2023)   Hunger Vital Sign    Worried About Running Out of Food in the Last Year: Never true    Ran Out of Food in the Last Year: Never true  Transportation Needs: No Transportation Needs (08/08/2023)   PRAPARE - Administrator, Civil Service (Medical): No    Lack of Transportation (Non-Medical): No  Physical Activity: Inactive (08/08/2023)   Exercise Vital Sign    Days of Exercise per Week: 0 days    Minutes of Exercise per Session: 0 min  Stress: No Stress Concern Present  (08/08/2023)   [employer] of Occupational Health - Occupational Stress Questionnaire    Feeling of Stress : Not at all  Social Connections: Socially Integrated (08/08/2023)   Social Connection and Isolation Panel [NHANES]    Frequency of Communication with Friends and Family: Twice a week    Frequency of Social Gatherings with Friends and Family: Once a week    Attends Religious Services: More than 4 times per year    Active Member of [employer] or Organizations: Yes    Attends Engineer, structural: More than 4 times per year    Marital Status: Married  Catering manager Violence: Not At Risk (08/08/2023)   Humiliation, Afraid, Rape, and Kick questionnaire    Fear of Current or Ex-Partner: No    Emotionally Abused: No    Physically Abused: No    Sexually Abused: No    Family History:    Family History  Problem Relation Age of Onset   Arthritis Mother    Lung cancer Father    Asthma Father    Colon cancer Neg Hx    Prostate cancer Neg Hx      ROS:  Please see the history of present illness.   Physical Exam/Data:   Vitals:   12/20/23 1241 12/20/23 1242 12/20/23 1243 12/20/23 1244  BP:      Pulse:   74 71  Resp: (!) 26 20    Temp:      TempSrc:      SpO2:   100% 99%  Weight:      Height:        Intake/Output Summary (Last 24 hours) at 12/20/2023 1448 Last data filed at 12/20/2023 1238 Gross per 24 hour  Intake --  Output 400 ml  Net -400 ml      12/20/2023   11:10 AM 10/31/2023    9:38 AM 10/03/2023    9:52 AM  Last 3 Weights  Weight (lbs) 240 lb 240 lb 241 lb  Weight (kg) 108.863 kg 108.863 kg 109.317 kg     Body mass index is 34.44 kg/m.  General:  Well nourished, well developed, in no acute distress HEENT: normal Neck: no JVD Cardiac:  normal S1, S2; IRIR, tachycardic; no murmur  Lungs: Patient on BIPAP, crackles at the bases bilaterally Abd: soft, nontender, no hepatomegaly  Ext: 1+ LE edema Skin: warm and dry  Psych:  Normal affect   EKG:   The EKG was personally reviewed and demonstrates:  Atrial fibrillation with PVCs, rate 148 Telemetry:  Telemetry was personally reviewed and demonstrates:  Presented in atrial fibrillation rate 140-150s, rates slowed to 40-50 bpm although still in atrial fibrillation. Now in atrial fibrillation with rate 110-120 bpm.   Relevant CV Studies:  08/28/2023 Coronary CTA Consolidation or volume loss at the lung bases. Otherwise no significant extracardiac incidental findings identified.  07/18/2023 Long term monitor   ~3-day Zio patch monitor August 2024.   Predominant rhythm is sinus rhythm with first AV block.  Rate range 50 to 88 bpm and average 66 bpm.   Rare PACs and PVCs noted.   1 short atrial run of 4 beats with a rate range of 120-138 bpm and average of 130 bpm.  Lasted 2.1 seconds.   Patient noted symptoms with sinus rhythm and a PAC.   No Sustained Arrhythmias: Atrial Tachycardia (AT), Supraventricular Tachycardia (SVT), Atrial Fibrillation (A-Fib), Atrial Flutter (A-Flutter), Sustained Ventricular Tachycardia (VT)  04/15/2023 TTE  1. Left ventricular ejection fraction, by estimation, is 60 to 65%. The  left ventricle has normal function. The left ventricle has no regional  wall motion abnormalities. There is mild left ventricular hypertrophy.   2. Right ventricular systolic function is normal. The right ventricular  size is normal.   3. Trivial mitral valve regurgitation.   4. The aortic valve is tricuspid. Aortic valve regurgitation is not  visualized. Aortic valve sclerosis/calcification is present, without any  evidence of aortic stenosis.   Laboratory Data:  High Sensitivity Troponin:   Recent Labs  Lab 12/20/23 1122  TROPONINIHS 26*     Chemistry Recent Labs  Lab 12/20/23 1122  NA 140  K 4.7  CL 103  CO2 28  GLUCOSE 137*  BUN 19  CREATININE 1.18  CALCIUM  8.9  GFRNONAA >60  ANIONGAP 9    Recent Labs  Lab 12/20/23 1122  PROT 6.6  ALBUMIN 3.7  AST 21  ALT  30  ALKPHOS 47  BILITOT 1.6*   Lipids No results for input(s): "CHOL", "TRIG", "HDL", "LABVLDL", "LDLCALC", "CHOLHDL" in the last 168 hours.  Hematology Recent Labs  Lab 12/20/23 1122  WBC 9.4  RBC 4.40  HGB 14.0  HCT 44.8  MCV 101.8*  MCH 31.8  MCHC 31.3  RDW 13.8  PLT 177   Thyroid  No results for input(s): "TSH", "FREET4" in the last 168 hours.  BNP Recent Labs  Lab 12/20/23 1122  BNP 625.5*    DDimer  Recent Labs  Lab 12/20/23 1357  DDIMER 0.31     Radiology/Studies:  DG Chest 2 View Result Date: 12/20/2023 CLINICAL DATA:  Shortness of breath. EXAM: CHEST - 2 VIEW COMPARISON:  July 28, 2023. FINDINGS: Stable cardiomediastinal silhouette. Hypoinflation of the lungs is noted with minimal bibasilar subsegmental atelectasis. Bony thorax is unremarkable. IMPRESSION: Hypoinflation of the lungs with minimal bibasilar subsegmental atelectasis. Electronically Signed   By: Rosalene Colon M.D.   On: 12/20/2023 13:55     Assessment and Plan:   Atrial fibrillation with RVR - History of paroxysmal atrial fibrillation with RVR since hospitalization 04/2023 with similar symptoms. Treated with diltiazem  infusion  followed by amiodarone  infusion. Zio monitor 05/2023 showed predominantly sinus rhythm with a first degree AV block and average HR 66. No sustained arrhythmias or recurrent atrial fibrillation were noted. - Presented 1/15 with worsening shortness of breath x2 days, found to be in atrial fibrillation with RVR, rates up to 150 bpm - Given IV metoprolol  tartrate 5 mg with with rates dropping to 40-50 bpm although still in atrial fibrillation. Rates increased again into 140s and he was subsequently given 2.5 mg metoprolol  with rates improving to 110-120 bpm.  - Telemetry shows atrial fibrillation rate 110-120s bpm - Patient has been noncompliant with Eliquis , only taking once per day - Will start IV diltiazem  infusion, can consider adding back PTA metoprolol  tartrate 25 mg BID  for further rate control if needed - Continue Eliquis  5 mg BID  Acute on chronic HFpHF - Most recent echo 04/2023 shows LVEF 60-65% with mild left ventricular hypertrophy - BNP 625 - Repeat echo ordered  - Weight today 240 lbs dry weight per Dr. Jill Motes most recent clinic note is ~245 lbs - Given IV Lasix  40 mg in ED with -400 mL output recorded - Patient appears volume overloaded on exam - Cr 1.18 - Will start IV Lasix  40 mg BID  Acute on chronic respiratory failure with hypoxia and hypercapnia - VBG with decompensated respiratory acidosis on presentation, requiring BIPAP - Management per IM  CAD Elevated troponin - Coronary CTA 04/2023 showed mild to moderate non-occlusive disease - Troponin 26>39 in the setting of atrial fibrillation with RVR, suspect demand ischemia - EKG without ischemic changes - Chest pain free - No plan for further ischemic evaluation at this time - Statin intolerant, most recent LDL 58  Hypertension - BP stable, will hold off on PTA meds to prevent hypotension in the setting of diltiazem  and diuresis  For questions or updates, please contact [facility] Please consult [email or web address] for contact info under    Signed, Brodie Cannon, PA-C  12/20/2023 2:48 PM

## 2023-12-20 NOTE — Telephone Encounter (Signed)
 Copied from CRM [id number]. Topic: Clinical - Red Word Triage >> Dec 20, 2023  8:38 AM Justina Oman C wrote: Red Word that prompted transfer to Nurse Triage: Patient's spouse Haskell Linker [phone], patient is having body aches, tightness in chest, short of breath, trouble breathing, can't get a deep breath started yesterday. Patient has h/o arthritis, so he has pain from that. Patient denies a fever.   Chief Complaint: Shortness of breath x 2 days Symptoms: chest tightness, body aches Frequency: constant Pertinent Negatives: Patient denies fever Disposition: [x] ED /[] Urgent Care (no appt availability in office) / [] Appointment(In office/virtual)/ []  [place] [facility]/ [] Home Care/ [] Refused Recommended Disposition /[] [facility]/ []  Follow-up with PCP Additional Notes: Patient reports shortness of breath x 2 days, getting worse and now having chest tightness. Pt sts the last time he felt like this he was in A.Fib although he does not feel heart palpitations at this time. Patient states that he feels this is cardiac related. Pt advised to go to the ED. Patient is agreeable. States that he will go "after having some coffee and sees how he feels then".     Reason for Disposition  [1] MODERATE difficulty breathing (e.g., speaks in phrases, SOB even at rest, pulse 100-120) AND [2] NEW-onset or WORSE than normal  Answer Assessment - Initial Assessment Questions 1. RESPIRATORY STATUS: "Describe your breathing?" (e.g., wheezing, shortness of breath, unable to speak, severe coughing)      Short of breath  2. ONSET: "When did this breathing problem begin?"      Started 2 days  3. PATTERN "Does the difficult breathing come and go, or has it been constant since it started?"      Constant getting worse  4. SEVERITY: "How bad is your breathing?" (e.g., mild, moderate, severe)    - MILD: No SOB at rest, mild SOB with walking, speaks normally in sentences, can lie down, no retractions, pulse < 100.     - MODERATE: SOB at rest, SOB with minimal exertion and prefers to sit, cannot lie down flat, speaks in phrases, mild retractions, audible wheezing, pulse 100-120.    - SEVERE: Very SOB at rest, speaks in single words, struggling to breathe, sitting hunched forward, retractions, pulse > 120      Mild to Moderate shortness of breath  5. RECURRENT SYMPTOM: "Have you had difficulty breathing before?" If Yes, ask: "When was the last time?" and "What happened that time?"      Has had this before and was in A. Fib  6. CARDIAC HISTORY: "Do you have any history of heart disease?" (e.g., heart attack, angina, bypass surgery, angioplasty)      A.Fib, HTN  7. LUNG HISTORY: "Do you have any history of lung disease?"  (e.g., pulmonary embolus, asthma, emphysema)     No lung  8. CAUSE: "What do you think is causing the breathing problem?"      Unsure, thinks it could be heart related  9. OTHER SYMPTOMS: "Do you have any other symptoms? (e.g., dizziness, runny nose, cough, chest pain, fever)     Body aches, chest tightness, neck pain  10. O2 SATURATION MONITOR:  "Do you use an oxygen saturation monitor (pulse oximeter) at home?" If Yes, ask: "What is your reading (oxygen level) today?" "What is your usual oxygen saturation reading?" (e.g., 95%)       Yes, reading was 96% 2 days ago  11. PREGNANCY: "Is there any chance you are pregnant?" "When was your last menstrual period?"  N/A  12. TRAVEL: "Have you traveled out of the country in the last month?" (e.g., travel history, exposures)       No  Protocols used: Breathing Difficulty-A-AH

## 2023-12-20 NOTE — Assessment & Plan Note (Signed)
 Baseline CAD  No active chest pain  Monitor

## 2023-12-20 NOTE — Telephone Encounter (Signed)
 Agree with ER recommendation

## 2023-12-20 NOTE — Assessment & Plan Note (Signed)
 BP stable Titrate home regimen

## 2023-12-20 NOTE — ED Triage Notes (Signed)
 Patient comes in via [facility] from [facility] with complaints of shortness of breath. Pt called out because he was feeling generalized weakness at shortness of breath. When EMS arrived the patient had an O2 Saturation of 86% on room air. Pt placed on 4L of oxygen nasal cannula. Pt has a history of AFIB SINCE May 10, 202. Pt is currently taking Eloquis, and Furosemide . PT is alert and oriented x4, with no signs of distress at this time.

## 2023-12-20 NOTE — Assessment & Plan Note (Signed)
 2D ECHO 04/2023 w/ EF 60-65%  + hypercarbic resp failure on presentation w/ volume overload  Noted baseline interimittent use of lasix  at facility  BNP 625- well above baseline  1-2+ pitting edema on exam  CXR w/ cardiomegaly and vascular congestion preliminarily  IV lasix   Weight today 109kg  Monitor w/ diuresis  Cardiology consultation as clinically indicated

## 2023-12-20 NOTE — Assessment & Plan Note (Signed)
 Initial HR into 150s  Improved to 50s s/p IV metoprolol  x 1  Will continue eliquis   Monitor HR w/ BB use  Cardiology consultation as clinically indicated

## 2023-12-20 NOTE — H&P (Addendum)
 History and Physical    Patient: Todd Gonzales AVW:[id number] DOB: 1940/12/10 DOA: 12/20/2023 DOS: the patient was seen and examined on 12/20/2023 PCP: Donnie Galea, MD  Patient coming from: Home  Chief Complaint:  Chief Complaint  Patient presents with   Shortness of Breath   HPI: Todd Gonzales is a 83 y.o. male with medical history significant of morbid obesity, HFpEF, CAD, atrial fibrillation presenting with acute respiratory failure with hypoxia and hypercarbia, acute on chronic HFpEF.  Patient reports increased work of breathing over the past week or so.  Noted baseline HFpEF.  Reports intermittent compliance with Lasix .  Mild orthopnea PND as well as lower extremity swelling.  No chest pain.  No nausea or vomiting.  No focal hemiparesis or confusion.  Shortness of breath has become more pronounced.  Currently lives at assisted living facility.  Was redirected to the ER for worsening shortness of breath.  Noted to be satting at 82% on room air upon EMS arrival. Presented to ER afebrile, HR 140s-50s, BP stable. Initially in 80s on RA. Placed on BIPAP. VBG w/ hypercarbia. BNP 600s. Cr 1.2. T bili 1.6.  Review of Systems: As mentioned in the history of present illness. All other systems reviewed and are negative. Past Medical History:  Diagnosis Date   Actinic keratosis 03/28/2023   glabella, needs LN2   Aortic atherosclerosis (HCC)    a. Noted on CT 02/2023.   Arthritis    Basal cell carcinoma 03/28/2023   right paranasal, no tx, cleared with bx   BPH (benign prostatic hyperplasia)    nocturia x4 at baseline   Coronary artery calcification seen on CT scan    H/O hiatal hernia    Hemorrhoids    History of echocardiogram    a. 04/2023 Echo: EF 60-65%, no rwma, mild LVH, nl RV fxn, triv MR.   Hyperlipidemia    Hypertension    Dr Tisovec- LOV with clearance 3/13 on chart   IGT (impaired glucose tolerance)    last AIC 5.5- diet controlled- per office note 3/13   Paroxysmal atrial  fibrillation (HCC) 04/14/2023   a. 04/2023 Admitted with A-fib RVR.  Normal echo.  Discharged on amiodarone ; b. 05/2023 Zio: Predominantly sinus rhythm at 66 (50-88).  First-degree AV block.  4 beats of NSVT.  No sustained arrhythmias.  Amiodarone  discontinued.   SCC (squamous cell carcinoma) 02/10/2005   scc in situ-left forearm.   SCCA (squamous cell carcinoma) of skin 02/10/2005   Right Outer Eye (in situ) (curet and excision)   SCCA (squamous cell carcinoma) of skin 02/13/2006   Right Ear (well diff) (curet and 5FU)   SCCA (squamous cell carcinoma) of skin 04/05/2006   Left Ear Bowl (in situ) (tx p bx)   SCCA (squamous cell carcinoma) of skin 07/06/2010   Right Cheek (in situ) (curet and zyclara)   SCCA (squamous cell carcinoma) of skin 04/27/2009   Left Upper Forearm (in situ) (curet 5FU)   SCCA (squamous cell carcinoma) of skin 08/28/2014   Right Cheek (well diff)   SCCA (squamous cell carcinoma) of skin 03/21/2016   Left Jawline Ant (well diff) (curet and 5FU)   SCCA (squamous cell carcinoma) of skin 01/19/2017   Right Hand (in situ) (tx p bx)   SCCA (squamous cell carcinoma) of skin 03/04/1840   Left Jawline Inf (in situ) (curet and 5FU)   SCCA (squamous cell carcinoma) of skin 12/23/2020   Left Forehead (well diff) (tx p bx)   SCCA (  squamous cell carcinoma) of skin 12/23/2020   Left Submandibular Area (in situ) (tx p bx)   Seasonal allergies    Spinal stenosis    Squamous cell carcinoma of skin 08/31/2020   in situ-left jawline cheek   Squamous cell carcinoma of skin 04/27/2021   infil- left nasal sidewall(MOHS)   Squamous cell carcinoma of skin 03/28/2023   Left medial cheek above nasolabial, not treated, but cleared with bx   Squamous cell carcinoma of skin 08/16/2023   Left lateral upper pretibia, EDC   Squamous cell carcinoma of skin of face 04/27/2021   infil- Left Temporal scalp (MOHS   UTI (lower urinary tract infection)    required admission to [facility]   Past  Surgical History:  Procedure Laterality Date   BACK SURGERY  2008   Laminectomy L4-5   JOINT REPLACEMENT Bilateral    bil   KNEE ARTHROSCOPY     right   LUMBAR LAMINECTOMY/DECOMPRESSION MICRODISCECTOMY Bilateral 11/20/2018   Procedure: Bilateral Thoracic Twelve to Lumbar One Laminectomy;  Surgeon: Elna Haggis, MD;  Location: [facility] OR;  Service: Neurosurgery;  Laterality: Bilateral;  posterior   TOTAL HIP ARTHROPLASTY  03/20/2012   Procedure: TOTAL HIP ARTHROPLASTY ANTERIOR APPROACH;  Surgeon: Bevin Bucks, MD;  Location: [facility] ORS;  Service: Orthopedics;  Laterality: Left;   Social History:  reports that he has never smoked. He has never used smokeless tobacco. He reports current alcohol use. He reports that he does not use drugs.  Allergies  Allergen Reactions   Codeine Other (See Comments)    Unknown reaction   Lipitor [Atorvastatin ] Other (See Comments)    Arthralgias    Bactrim [Sulfamethoxazole-Trimethoprim] Rash    Family History  Problem Relation Age of Onset   Arthritis Mother    Lung cancer Father    Asthma Father    Colon cancer Neg Hx    Prostate cancer Neg Hx     Prior to Admission medications   Medication Sig Start Date End Date Taking? Authorizing Provider  acetaminophen  (TYLENOL ) 650 MG CR tablet Take 1,300 mg by mouth 2 (two) times daily as needed for pain.   Yes [provider]  Camphor-Menthol -Methyl Sal (SALONPAS EX) Apply 1 patch topically daily as needed (pain).   Yes [provider]  Cholecalciferol  (VITAMIN D -3 PO) Take 1 capsule by mouth daily.   Yes [provider]  cyanocobalamin  (VITAMIN B12) 1000 MCG/ML injection Inject 1 mL (1,000 mcg total) into the muscle every 30 (thirty) days. 01/26/23  Yes Donnie Galea, MD  diclofenac Sodium (VOLTAREN) 1 % GEL Apply 1 Application topically every 6 (six) hours as needed (pain). 09/05/16  Yes [provider]  ELIQUIS  5 MG TABS tablet TAKE 1 TABLET BY MOUTH TWICE A DAY 07/20/23   Yes Donnie Galea, MD  famotidine  (PEPCID ) 20 MG tablet Take 1 tablet (20 mg total) by mouth 2 (two) times daily. 10/03/23  Yes Elois Hair, MD  furosemide  (LASIX ) 20 MG tablet Take 1 tablet (20 mg total) by mouth daily as needed for fluid. 10/31/23 01/29/24 Yes Donnie Galea, MD  gabapentin  (NEURONTIN ) 300 MG capsule TAKE ONE CAPSULE BY MOUTH ONCE DAILY 10/25/23  Yes Donnie Galea, MD  hydrocortisone  (PROCTOSOL HC) 2.5 % rectal cream Place 1 application. rectally 2 (two) times daily. 04/07/22  Yes Donnie Galea, MD  metoprolol  tartrate (LOPRESSOR ) 25 MG tablet TAKE 1 TABLET BY MOUTH TWICE A DAY 11/21/23  Yes Arleen Lacer, MD  mometasone  (ELOCON )  0.1 % cream Spot treat thicker areas on lower legs once to twice daily until improved. Avoid face, groin, axilla. 05/03/23  Yes Artemio Larry, MD  ondansetron  (ZOFRAN ) 4 MG tablet TAKE ONE TABLET (4 MG TOTAL) BY MOUTH EVERY EIGHT (EIGHT) HOURS AS NEEDED FOR NAUSEA OR VOMITING. 11/01/23  Yes Donnie Galea, MD  polyethylene glycol (MIRALAX  / GLYCOLAX ) 17 g packet Take 17 g by mouth daily as needed for mild constipation. 04/16/23  Yes Krishnan, Gokul, MD  tamsulosin  (FLOMAX ) 0.4 MG CAPS capsule TAKE TWO CAPSULE BY MOUTH DAILY 11/10/23  Yes Donnie Galea, MD  OVER THE COUNTER MEDICATION Place 1-2 sprays into the nose daily as needed (allergies, nasal congestion). Unknown nasal spray.    [provider]    Physical Exam: Vitals:   12/20/23 1241 12/20/23 1242 12/20/23 1243 12/20/23 1244  BP:      Pulse:   74 71  Resp: (!) 26 20    Temp:      TempSrc:      SpO2:   100% 99%  Weight:      Height:       Physical Exam Constitutional:      Appearance: He is obese.  HENT:     Head: Normocephalic and atraumatic.     Nose: Nose normal.     Mouth/Throat:     Mouth: Mucous membranes are moist.  Cardiovascular:     Rate and Rhythm: Normal rate and regular rhythm.  Pulmonary:     Effort: Pulmonary effort is normal.   Abdominal:     General: Bowel sounds are normal.  Musculoskeletal:        General: Normal range of motion.  Skin:    General: Skin is warm.  Neurological:     General: No focal deficit present.  Psychiatric:        Mood and Affect: Mood normal.     Data Reviewed:  There are no new results to review at this time.  DG Chest 2 View CLINICAL DATA:  Shortness of breath.  EXAM: CHEST - 2 VIEW  COMPARISON:  July 28, 2023.  FINDINGS: Stable cardiomediastinal silhouette. Hypoinflation of the lungs is noted with minimal bibasilar subsegmental atelectasis. Bony thorax is unremarkable.  IMPRESSION: Hypoinflation of the lungs with minimal bibasilar subsegmental atelectasis.  Electronically Signed   By: Rosalene Colon M.D.   On: 12/20/2023 13:55  Lab Results  Component Value Date   WBC 9.4 12/20/2023   HGB 14.0 12/20/2023   HCT 44.8 12/20/2023   MCV 101.8 (H) 12/20/2023   PLT 177 12/20/2023   Last metabolic panel Lab Results  Component Value Date   GLUCOSE 137 (H) 12/20/2023   NA 140 12/20/2023   K 4.7 12/20/2023   CL 103 12/20/2023   CO2 28 12/20/2023   BUN 19 12/20/2023   CREATININE 1.18 12/20/2023   GFRNONAA >60 12/20/2023   CALCIUM  8.9 12/20/2023   PROT 6.6 12/20/2023   ALBUMIN 3.7 12/20/2023   BILITOT 1.6 (H) 12/20/2023   ALKPHOS 47 12/20/2023   AST 21 12/20/2023   ALT 30 12/20/2023   ANIONGAP 9 12/20/2023    Assessment and Plan: Acute on chronic respiratory failure with hypoxia and hypercapnia (HCC) Decompensated resp failure requiring BIPAP in setting of acute on chronic HFpEF  Noted decompensated resp acidosis on presentation  No noted prior hx/o tobacco abuse or obstructive lung disease  Suspect element of OHS/sleep apnea-noted large body habitus  D dimer pending  Cont w/ BIPAP  for now  Monitor  Acute on chronic heart failure with preserved ejection fraction (HFpEF) (HCC) 2D ECHO 04/2023 w/ EF 60-65%  + hypercarbic resp failure on  presentation w/ volume overload  Noted baseline interimittent use of lasix  at facility  BNP 625- well above baseline  1-2+ pitting edema on exam  CXR w/ cardiomegaly and vascular congestion preliminarily  IV lasix   Weight today 109kg  Monitor w/ diuresis  Cardiology consultation as clinically indicated   Atrial fibrillation with rapid ventricular response (HCC) Initial HR into 150s  Improved to 50s s/p IV metoprolol  x 1  Will continue eliquis   Monitor HR w/ BB use  Cardiology consultation as clinically indicated   Coronary artery disease, non-occlusive Baseline CAD  No active chest pain  Monitor   HTN (hypertension) BP stable  Titrate home regimen        Advance Care Planning:   Code Status: Prior   Consults: Cardiology  Family Communication: Wife at the bedside   Severity of Illness: The appropriate patient status for this patient is INPATIENT. Inpatient status is judged to be reasonable and necessary in order to provide the required intensity of service to ensure the patient's safety. The patient's presenting symptoms, physical exam findings, and initial radiographic and laboratory data in the context of their chronic comorbidities is felt to place them at high risk for further clinical deterioration. Furthermore, it is not anticipated that the patient will be medically stable for discharge from the hospital within 2 midnights of admission.   * I certify that at the point of admission it is my clinical judgment that the patient will require inpatient hospital care spanning beyond 2 midnights from the point of admission due to high intensity of service, high risk for further deterioration and high frequency of surveillance required.*  Author: Corrinne Din, MD 12/20/2023 1:41 PM  For on call review [email or web address].

## 2023-12-20 NOTE — ED Provider Notes (Signed)
 [facility] Provider Note    Event Date/Time   First MD Initiated Contact with Patient 12/20/23 1125     (approximate)   History   Shortness of Breath   HPI  Todd Gonzales is a 83 y.o. male history of CHF, hypertension, hyperlipidemia, atrial fibrillation, presenting with shortness of breath.  States that shortness of breath has been ongoing for a while but got worse today.  States that he has not been taking his furosemide  since he takes it on a as needed basis.  States that he is not typically on oxygen.  No new cough or fever.  States that he does have some chest pain yesterday but not today.  No abdominal pain or back pain.  No vomiting or diarrhea.  Per EMS when they arrived, he was satting 86% on room air, he was placed on 4 L nasal cannula with improvement to his oxygen status.  On independent review he was seen by his primary care doctor in November, no shortness of breath or chest pain at the time, on his med review he was supposed to be on 20 mg Lasix  daily as needed.     Physical Exam   Triage Vital Signs: ED Triage Vitals  Encounter Vitals Group     BP 12/20/23 1115 118/79     Systolic BP Percentile --      Diastolic BP Percentile --      Pulse Rate 12/20/23 1115 (!) 147     Resp 12/20/23 1115 (!) 27     Temp 12/20/23 1115 98.5 F (36.9 C)     Temp Source 12/20/23 1115 Oral     SpO2 12/20/23 1115 98 %     Weight 12/20/23 1110 240 lb (108.9 kg)     Height 12/20/23 1110 5\' 10"  (1.778 m)     Head Circumference --      Peak Flow --      Pain Score 12/20/23 1110 0     Pain Loc --      Pain Education --      Exclude from Growth Chart --     Most recent vital signs: Vitals:   12/20/23 1244 12/20/23 1543  BP:    Pulse: 71   Resp:    Temp:  (!) 97.3 F (36.3 C)  SpO2: 99%      General: Awake, no distress.  CV:  Good peripheral perfusion.  Irregular, tachycardic Resp:  Normal effort.  Crackles at the bases Abd:  No distention.   Soft nontender Other:  Symmetrical extremities, there is lower extremity edema.   ED Results / Procedures / Treatments   Labs (all labs ordered are listed, but only abnormal results are displayed) Labs Reviewed  BRAIN NATRIURETIC PEPTIDE - Abnormal; Notable for the following components:      Result Value   B Natriuretic Peptide 625.5 (*)    All other components within normal limits  COMPREHENSIVE METABOLIC PANEL - Abnormal; Notable for the following components:   Glucose, Bld 137 (*)    Total Bilirubin 1.6 (*)    All other components within normal limits  CBC WITH DIFFERENTIAL/PLATELET - Abnormal; Notable for the following components:   MCV 101.8 (*)    Neutro Abs 7.9 (*)    Lymphs Abs 0.6 (*)    All other components within normal limits  BLOOD GAS, VENOUS - Abnormal; Notable for the following components:   pH, Ven 7.24 (*)    pCO2, Ven  79 (*)    pO2, Ven <31 (*)    Bicarbonate 33.9 (*)    Acid-Base Excess 3.9 (*)    All other components within normal limits  PROTIME-INR - Abnormal; Notable for the following components:   Prothrombin Time 16.8 (*)    INR 1.3 (*)    All other components within normal limits  BLOOD GAS, VENOUS - Abnormal; Notable for the following components:   pCO2, Ven 77 (*)    pO2, Ven <31 (*)    Bicarbonate 33.8 (*)    Acid-Base Excess 4.0 (*)    All other components within normal limits  TROPONIN I (HIGH SENSITIVITY) - Abnormal; Notable for the following components:   Troponin I (High Sensitivity) 26 (*)    All other components within normal limits  TROPONIN I (HIGH SENSITIVITY) - Abnormal; Notable for the following components:   Troponin I (High Sensitivity) 39 (*)    All other components within normal limits  RESP PANEL BY RT-PCR (RSV, FLU A&B, COVID)  [id number]  LACTIC ACID, PLASMA  LACTIC ACID, PLASMA  D-DIMER, QUANTITATIVE  BLOOD GAS, VENOUS     EKG  Atrial fibrillation with RVR, rate 151, QRS, QTc, baseline wandering, there are PVCs, no  ischemic ST elevation, no ischemic ST depressions, no obvious T wave changes, this is change compared to prior since prior EKG showed sinus bradycardia but patient does have history of paroxysmal A-fib.   RADIOLOGY Independent review and interpretation of chest x-ray, no obvious large consolidation.  Radiology impression, hypoinflation of lungs with minimal bibasilar segmental atelectasis.   PROCEDURES:  Critical Care performed: Yes, see critical care procedure note(s)  .Critical Care  Performed by: Shane Darling, MD Authorized by: Shane Darling, MD   Critical care provider statement:    Critical care time (minutes):  45   Critical care was necessary to treat or prevent imminent or life-threatening deterioration of the following conditions:  Cardiac failure   Critical care was time spent personally by me on the following activities:  Development of treatment plan with patient or surrogate, discussions with consultants, evaluation of patient's response to treatment, examination of patient, ordering and review of laboratory studies, ordering and review of radiographic studies, ordering and performing treatments and interventions, pulse oximetry, re-evaluation of patient's condition and review of old charts    MEDICATIONS ORDERED IN ED: Medications  apixaban  (ELIQUIS ) tablet 5 mg (has no administration in time range)  furosemide  (LASIX ) tablet 40 mg (40 mg Oral Given 12/20/23 1454)  furosemide  (LASIX ) injection 40 mg (40 mg Intravenous Given 12/20/23 1141)  metoprolol  tartrate (LOPRESSOR ) injection 5 mg (5 mg Intravenous Given 12/20/23 1234)  metoprolol  tartrate (LOPRESSOR ) injection 2.5 mg (2.5 mg Intravenous Given 12/20/23 1539)     IMPRESSION / MDM / ASSESSMENT AND PLAN / ED COURSE  I reviewed the triage vital signs and the nursing notes.                              Differential diagnosis includes, but is not limited to, CHF exacerbation, viral illness, pneumonia, COVID, influenza,  RSV, hypercarbic respiratory failure, hypoxemic respiratory failure, considered PE but given the crackles in the base as well as small lower extremity edema and history of CHF, considered that is more likely CHF exacerbation versus PE since he has no reported history of blood clots, he is on blood thinner, no recent travel or surgeries.  Also consider ACS.  Patient's presentation  is most consistent with acute presentation with potential threat to life or bodily function.  Patient presenting with shortness of breath, has been off his furosemide  for 2 weeks, was found to be hypoxic and placed on oxygen.  BNP is elevated, respiratory panel is negative, his VBG showed respiratory acidosis for which she was placed on BiPAP.  Given that he is high risk, he needs to be admitted, hospitalist was consulted who is agreeable plan for admission will evaluate the patient.  He is admitted.  Clinical Course as of 12/20/23 1635  Wed Dec 20, 2023  1209 Blood gas, venous(!!) Respiratory acidosis, we will get him started on BiPAP. [TT]  1230 Patient currently on BiPAP, sleeping, heart rate still in the 140s to 150s, will give him a dose of IV metoprolol  since he is on p.o. metoprolol  baseline. [TT]  1247 Assessment after metoprolol , rate now in the 60s, still in A-fib. [TT]  1247 B Natriuretic Peptide(!): 625.5 Elevated. [TT]  1247 Lactic Acid, Venous: 1.3 Normal. [TT]  1247 Resp panel by RT-PCR (RSV, Flu A&B, Covid) Anterior Nasal Swab No COVID, influenza, RSV. [TT]  1247 Comprehensive metabolic panel(!) Not severely deranged. [TT]  1247 CBC with Differential(!) No leukocytosis. [TT]  1320 Consulted hospitalist was agreeable plan for admission will evaluate the patient.  He is admitted. [TT]    Clinical Course User Index [TT] Shane Darling, MD     FINAL CLINICAL IMPRESSION(S) / ED DIAGNOSES   Final diagnoses:  SOB (shortness of breath)  Acute on chronic congestive heart failure, unspecified heart  failure type (HCC)  Hypoxia  Respiratory acidosis  Atrial fibrillation with rapid ventricular response (HCC)     Rx / DC Orders   ED Discharge Orders     None        Note:  This document was prepared using Dragon voice recognition software and may include unintentional dictation errors.    Shane Darling, MD 12/20/23 [phone]

## 2023-12-20 NOTE — Assessment & Plan Note (Addendum)
 Decompensated resp failure requiring BIPAP in setting of acute on chronic HFpEF  Noted decompensated resp acidosis on presentation  No noted prior hx/o tobacco abuse or obstructive lung disease  Suspect element of OHS/sleep apnea-noted large body habitus  D dimer pending  Cont w/ BIPAP for now  Monitor

## 2023-12-20 NOTE — ED Notes (Signed)
 Medium size condom cath placed on pt

## 2023-12-20 NOTE — Progress Notes (Signed)
 Resp acidosis improving on BIPAP  Will continue to trend  Discussed w/ RT Reassess in 3-4 hours

## 2023-12-21 ENCOUNTER — Inpatient Hospital Stay (HOSPITAL_COMMUNITY)
Admit: 2023-12-21 | Discharge: 2023-12-21 | Disposition: A | Discharge status: Home / Self Care | Payer: Medicare Other | Attending: Physician Assistant | Admitting: Physician Assistant

## 2023-12-21 DIAGNOSIS — J9621 Acute and chronic respiratory failure with hypoxia: Secondary | ICD-10-CM

## 2023-12-21 DIAGNOSIS — E8729 Other acidosis: Secondary | ICD-10-CM

## 2023-12-21 DIAGNOSIS — I4891 Unspecified atrial fibrillation: Secondary | ICD-10-CM

## 2023-12-21 DIAGNOSIS — I2510 Atherosclerotic heart disease of native coronary artery without angina pectoris: Secondary | ICD-10-CM | POA: Diagnosis not present

## 2023-12-21 DIAGNOSIS — J9622 Acute and chronic respiratory failure with hypercapnia: Secondary | ICD-10-CM

## 2023-12-21 DIAGNOSIS — J9601 Acute respiratory failure with hypoxia: Secondary | ICD-10-CM | POA: Diagnosis not present

## 2023-12-21 DIAGNOSIS — I509 Heart failure, unspecified: Secondary | ICD-10-CM | POA: Diagnosis not present

## 2023-12-21 LAB — BASIC METABOLIC PANEL
Anion gap: 11 (ref 5–15)
BUN: 24 mg/dL — ABNORMAL HIGH (ref 8–23)
CO2: 32 mmol/L (ref 22–32)
Calcium: 8.3 mg/dL — ABNORMAL LOW (ref 8.9–10.3)
Chloride: 100 mmol/L (ref 98–111)
Creatinine, Ser: 1.15 mg/dL (ref 0.61–1.24)
GFR, Estimated: >60 mL/min (ref >60)
Glucose, Bld: 115 mg/dL — ABNORMAL HIGH (ref 70–99)
Potassium: 4.3 mmol/L (ref 3.5–5.1)
Sodium: 143 mmol/L (ref 135–145)

## 2023-12-21 LAB — ECHOCARDIOGRAM COMPLETE
AR max vel: 1.66 cm2
AV Area VTI: 1.55 cm2
AV Area mean vel: 1.44 cm2
AV Mean grad: 7 mm[Hg]
AV Peak grad: 13.9 mm[Hg]
Ao pk vel: 1.87 m/s
Area-P 1/2: 4.26 cm2
Height: 70 in
MV VTI: 1.86 cm2
S' Lateral: 2.7 cm
Weight: 3840 [oz_av]

## 2023-12-21 LAB — BLOOD GAS, VENOUS
Acid-Base Excess: 3.9 mmol/L — ABNORMAL HIGH (ref 0.0–2.0)
Bicarbonate: 33.8 mmol/L — ABNORMAL HIGH (ref 20.0–28.0)
Bicarbonate: 35 mmol/L — ABNORMAL HIGH (ref 20.0–28.0)
Bicarbonate: 35 mmol/L — ABNORMAL HIGH (ref 20.0–28.0)
Delivery systems: POSITIVE
FIO2: 35 %
O2 Saturation: 37.6 %
O2 Saturation: 35 %
O2 Saturation: 24.9 %
Patient temperature: 37
Patient temperature: 37
Patient temperature: 37 %
Patient temperature: 37
pCO2, Ven: 79 mm[Hg] (ref 44–60)
pCO2, Ven: 77 mm[Hg] (ref 44–60)
pCO2, Ven: 78 mmHg (ref 44–60)
pH, Ven: 7.24 — ABNORMAL LOW (ref 7.25–7.43)
pH, Ven: 7.25 (ref 7.25–7.43)
pH, Ven: 7.26 (ref 7.25–7.43)
pO2, Ven: <33.9 mmol/L — ABNORMAL HIGH (ref 32–45)
pO2, Ven: <33.8 mmol/L — ABNORMAL HIGH (ref 32–45)

## 2023-12-21 LAB — MAGNESIUM: Magnesium: 2 mg/dL (ref 1.7–2.4)

## 2023-12-21 MED ORDER — IPRATROPIUM-ALBUTEROL 0.5-2.5 (3) MG/3ML IN SOLN: 3.0000 mL | Freq: Four times a day (QID) | RESPIRATORY_TRACT | Status: DC | PRN

## 2023-12-21 MED ORDER — SODIUM CHLORIDE 0.9 % IV SOLN: INTRAVENOUS | Status: DC

## 2023-12-21 MED ORDER — DIGOXIN 250 MCG PO TABS
0.2500 mg | ORAL_TABLET | Freq: Every day | ORAL | Status: DC
  Administered 2023-12-21 – 2023-12-22 (×2): 0.25 mg via ORAL
  Filled 2023-12-21 (×3): qty 1

## 2023-12-21 MED ORDER — ARFORMOTEROL TARTRATE 15 MCG/2ML IN NEBU
15.0000 ug | INHALATION_SOLUTION | Freq: Two times a day (BID) | RESPIRATORY_TRACT | Status: DC
  Administered 2023-12-21 – 2023-12-25 (×7): 15 ug via RESPIRATORY_TRACT
  Filled 2023-12-21 (×10): qty 2

## 2023-12-21 MED ORDER — FUROSEMIDE 10 MG/ML IJ SOLN: 40.0000 mg | Freq: Every day | INTRAMUSCULAR | Status: DC

## 2023-12-21 NOTE — Progress Notes (Signed)
Informed Consent   Shared Decision Making/Informed Consent   The risks [stroke, cardiac arrhythmias rarely resulting in the need for a temporary or permanent pacemaker, skin irritation or burns, esophageal damage, perforation (1:10,000 risk), bleeding, pharyngeal hematoma as well as other potential complications associated with conscious sedation including aspiration, arrhythmia, respiratory failure and death], benefits (treatment guidance, restoration of normal sinus rhythm, diagnostic support) and alternatives of a transesophageal echocardiogram guided cardioversion were discussed in detail with Todd Gonzales and he is willing to proceed.

## 2023-12-21 NOTE — ED Notes (Signed)
Pt given coffee per request. Pt aware of NPO status after midnight.

## 2023-12-21 NOTE — ED Notes (Signed)
Admission MD at bedside.  

## 2023-12-21 NOTE — ED Notes (Signed)
This RN changed patients linens and cleaned patient up. Patient provided with a new gown and warm blankets at this time.

## 2023-12-21 NOTE — Progress Notes (Signed)
*  PRELIMINARY RESULTS* Echocardiogram 2D Echocardiogram has been performed.  Todd Gonzales 12/21/2023, 2:45 PM

## 2023-12-21 NOTE — Progress Notes (Signed)
[facility] Progress Note  Patient: Todd Gonzales    ZHY:[id number]  DOA: 12/20/2023     Date of Service: the patient was seen and examined on 12/21/2023  Chief Complaint  Patient presents with   Shortness of Breath   Brief hospital course: ABRAHEM GOREN is a 83 y.o. male with medical history significant of morbid obesity, HFpEF, CAD, atrial fibrillation presenting with acute respiratory failure with hypoxia and hypercarbia, acute on chronic HFpEF.  Patient reports increased work of breathing over the past week or so.  Noted baseline HFpEF.  Reports intermittent compliance with Lasix.  Mild orthopnea PND as well as lower extremity swelling.  No chest pain.  No nausea or vomiting.  No focal hemiparesis or confusion.  Shortness of breath has become more pronounced.  Currently lives at assisted living facility.  Was redirected to the ER for worsening shortness of breath.  Noted to be satting at 82% on room air upon EMS arrival. Presented to ER afebrile, HR 140s-50s, BP stable. Initially in 80s on RA. Placed on BIPAP. VBG w/ hypercarbia. BNP 600s. Cr 1.2. T bili 1.6.    Assessment and Plan:  # Acute on chronic respiratory failure with hypoxia and hypercapnia (HCC) Decompensated resp failure requiring BIPAP in setting of acute on chronic HFpEF  Noted decompensated resp acidosis on presentation  No noted prior hx/o tobacco abuse or obstructive lung disease  Suspect element of OHS/sleep apnea-noted large body habitus  D dimer  0.31 negative Cont w/ BIPAP prn Monitor Started Brovana nebulizer twice a day Started DuoNeb every 6 hourly as needed Patient needs sleep study and pulmonary follow-up as an outpatient for PFTs  Acute on chronic heart failure with preserved ejection fraction (HFpEF) (HCC) 2D ECHO 04/2023 w/ EF 60-65%  + hypercarbic resp failure on presentation w/ volume overload  Noted baseline interimittent use of lasix at facility  BNP 625- well above baseline  1-2+ pitting  edema on exam  CXR w/ cardiomegaly and vascular congestion preliminarily  IV lasix  Weight today 109kg  Monitor w/ diuresis  Cardiology consultation as clinically indicated    Atrial fibrillation with rapid ventricular response (HCC) Initial HR into 150s  Improved to 50s s/p IV metoprolol x 1  continue eliquis  Started digoxin 0.5 mg p.o. daily Continue Cardizem IV infusion Monitor HR w/ BB use  Cardiology consulted Keep n.p.o. after midnight, cardiology is planning for TEE and cardioversion     Coronary artery disease, non-occlusive Baseline CAD  No active chest pain  Monitor    HTN (hypertension) BP stable  Titrate home regimen   Obesity class I Body mass index is 34.44 kg/m.  Interventions:  Diet: Heart healthy diet, fluid restriction 1.5 L/day DVT Prophylaxis: Therapeutic Anticoagulation with Eliquis    Advance goals of care discussion: Full code  Family Communication: family was not present at bedside, at the time of interview.  The pt provided permission to discuss medical plan with the family. Opportunity was given to ask question and all questions were answered satisfactorily.   Disposition:  Pt is from Home, admitted with Resp failure and A.fib, still has A.fib and resp failure, which precludes a safe discharge. Discharge to Home, when stable and cleared by cardiology.  Subjective: Patient was admitted overnight due to respiratory failure, breathing is slightly better today, patient is off the BiPAP.  Still in A-fib with RVR, denies any chest pain or palpitations, overall feels improvement.  Physical Exam: General: NAD, lying comfortably Appear in no  distress, affect appropriate Eyes: PERRLA ENT: Oral Mucosa Clear, moist  Neck: no JVD,  Cardiovascular: S1 and S2 Present, no Murmur, irregular rhythm Respiratory: good respiratory effort, Bilateral Air entry equal and Decreased, no Crackles, no wheezes Abdomen: Bowel Sound present, Soft and no tenderness,   Skin: no rashes Extremities: no Pedal edema, no calf tenderness Neurologic: without any new focal findings Gait not checked due to patient safety concerns  Vitals:   12/21/23 1300 12/21/23 1410 12/21/23 1630 12/21/23 1700  BP: 111/64  112/76 105/73  Pulse: 74 (!) 110 (!) 115 90  Resp: (!) 23 20 20  (!) 24  Temp: 98.6 F (37 C)     TempSrc: Oral     SpO2: 100% 96% 99% 94%  Weight:      Height:        Intake/Output Summary (Last 24 hours) at 12/21/2023 1756 Last data filed at 12/21/2023 2130 Gross per 24 hour  Intake --  Output 500 ml  Net -500 ml   Filed Weights   12/20/23 1110  Weight: 108.9 kg    Data Reviewed: I have personally reviewed and interpreted daily labs, tele strips, imagings as discussed above. I reviewed all nursing notes, pharmacy notes, vitals, pertinent old records I have discussed plan of care as described above with RN and patient/family.  CBC: Recent Labs  Lab 12/20/23 1122  WBC 9.4  NEUTROABS 7.9*  HGB 14.0  HCT 44.8  MCV 101.8*  PLT 177   Basic Metabolic Panel: Recent Labs  Lab 12/20/23 1122 12/20/23 1356 12/21/23 0812  NA 140  --  143  K 4.7  --  4.3  CL 103  --  100  CO2 28  --  32  GLUCOSE 137*  --  115*  BUN 19  --  24*  CREATININE 1.18  --  1.15  CALCIUM 8.9  --  8.3*  MG  --  2.0  --     Studies: ECHOCARDIOGRAM COMPLETE Result Date: 12/21/2023    ECHOCARDIOGRAM REPORT   Patient Name:   DILAND NOVOSAD Date of Exam: 12/21/2023 Medical Rec #:  [id number]     Height:       70.0 in Accession #:    [id number]    Weight:       240.0 lb Date of Birth:  01/30/1941     BSA:          2.255 m Patient Age:    82 years      BP:           126/88 mmHg Patient Gender: M             HR:           99 bpm. Exam Location:  [facility] Procedure: 2D Echo, Cardiac Doppler and Color Doppler Indications:     Atrial Fibrillaiton  History:         Patient has prior history of Echocardiogram examinations, most                  recent 04/15/2023. CHF, CAD,  Arrythmias:Atrial Fibrillation,                  Atrial Flutter and PVC, Signs/Symptoms:Fatigue and Shortness of                  Breath; Risk Factors:Hypertension and Dyslipidemia.  Sonographer:     Mikki Harbor Referring Phys:  [id number] [facility] CAREY Diagnosing Phys: Julien Nordmann MD  Sonographer Comments:  Technically difficult study due to poor echo windows and patient is obese. IMPRESSIONS  1. Left ventricular ejection fraction, by estimation, is 60 to 65%. The left ventricle has normal function. The left ventricle has no regional wall motion abnormalities. There is mild left ventricular hypertrophy. Left ventricular diastolic parameters are indeterminate.  2. Right ventricular systolic function is normal. The right ventricular size is normal. There is normal pulmonary artery systolic pressure. The estimated right ventricular systolic pressure is 29.9 mmHg.  3. The mitral valve is normal in structure. No evidence of mitral valve regurgitation. No evidence of mitral stenosis. Moderate mitral annular calcification.  4. The aortic valve is normal in structure. Aortic valve regurgitation is not visualized. No aortic stenosis is present.  5. The inferior vena cava is normal in size with greater than 50% respiratory variability, suggesting right atrial pressure of 3 mmHg. FINDINGS  Left Ventricle: Left ventricular ejection fraction, by estimation, is 60 to 65%. The left ventricle has normal function. The left ventricle has no regional wall motion abnormalities. The left ventricular internal cavity size was normal in size. There is  mild left ventricular hypertrophy. Left ventricular diastolic parameters are indeterminate. Right Ventricle: The right ventricular size is normal. No increase in right ventricular wall thickness. Right ventricular systolic function is normal. There is normal pulmonary artery systolic pressure. The tricuspid regurgitant velocity is 2.34 m/s, and  with an assumed right atrial pressure of  8 mmHg, the estimated right ventricular systolic pressure is 29.9 mmHg. Left Atrium: Left atrial size was normal in size. Right Atrium: Right atrial size was normal in size. Pericardium: There is no evidence of pericardial effusion. Mitral Valve: The mitral valve is normal in structure. Moderate mitral annular calcification. No evidence of mitral valve regurgitation. No evidence of mitral valve stenosis. MV peak gradient, 6.1 mmHg. The mean mitral valve gradient is 2.0 mmHg. Tricuspid Valve: The tricuspid valve is normal in structure. Tricuspid valve regurgitation is not demonstrated. No evidence of tricuspid stenosis. Aortic Valve: The aortic valve is normal in structure. Aortic valve regurgitation is not visualized. No aortic stenosis is present. Aortic valve mean gradient measures 7.0 mmHg. Aortic valve peak gradient measures 13.9 mmHg. Aortic valve area, by VTI measures 1.55 cm. Pulmonic Valve: The pulmonic valve was normal in structure. Pulmonic valve regurgitation is not visualized. No evidence of pulmonic stenosis. Aorta: The aortic root is normal in size and structure. Venous: The inferior vena cava is normal in size with greater than 50% respiratory variability, suggesting right atrial pressure of 3 mmHg. IAS/Shunts: No atrial level shunt detected by color flow Doppler.  LEFT VENTRICLE PLAX 2D LVIDd:         4.10 cm LVIDs:         2.70 cm LV PW:         1.30 cm LV IVS:        1.20 cm LVOT diam:     2.00 cm LV SV:         50 LV SV Index:   22 LVOT Area:     3.14 cm  RIGHT VENTRICLE RV Basal diam:  3.57 cm RV Mid diam:    3.40 cm LEFT ATRIUM             Index        RIGHT ATRIUM           Index LA diam:        4.10 cm 1.82 cm/m   RA Area:  21.30 cm LA Vol (A2C):   77.2 ml 34.23 ml/m  RA Volume:   64.50 ml  28.60 ml/m LA Vol (A4C):   36.9 ml 16.36 ml/m LA Biplane Vol: 56.4 ml 25.01 ml/m  AORTIC VALVE                     PULMONIC VALVE AV Area (Vmax):    1.66 cm      PV Vmax:       0.89 m/s AV Area  (Vmean):   1.44 cm      PV Peak grad:  3.1 mmHg AV Area (VTI):     1.55 cm AV Vmax:           186.67 cm/s AV Vmean:          117.667 cm/s AV VTI:            0.322 m AV Peak Grad:      13.9 mmHg AV Mean Grad:      7.0 mmHg LVOT Vmax:         98.75 cm/s LVOT Vmean:        53.750 cm/s LVOT VTI:          0.158 m LVOT/AV VTI ratio: 0.49  AORTA Ao Root diam: 3.20 cm MITRAL VALVE               TRICUSPID VALVE MV Area (PHT): 4.26 cm    TR Peak grad:   21.9 mmHg MV Area VTI:   1.86 cm    TR Vmax:        234.00 cm/s MV Peak grad:  6.1 mmHg MV Mean grad:  2.0 mmHg    SHUNTS MV Vmax:       1.23 m/s    Systemic VTI:  0.16 m MV Vmean:      68.6 cm/s   Systemic Diam: 2.00 cm MV Decel Time: 178 msec MV E velocity: 92.80 cm/s Julien Nordmann MD Electronically signed by Julien Nordmann MD Signature Date/Time: 12/21/2023/3:19:37 PM    Final     Scheduled Meds:  apixaban  5 mg Oral BID   digoxin  0.25 mg Oral Daily   [START ON 12/22/2023] furosemide  40 mg Intravenous Daily   Continuous Infusions:  diltiazem (CARDIZEM) infusion Stopped (12/21/23 0030)   PRN Meds:   Time spent: 35 minutes  Author: Gillis Santa. MD [facility] 12/21/2023 5:56 PM  To reach On-call, see care teams to locate the attending and reach out to them via [email or web address]. If 7PM-7AM, please contact night-coverage If you still have difficulty reaching the attending provider, please page the [facility] (Director on Call) for [facility] on amion for assistance.

## 2023-12-21 NOTE — Progress Notes (Signed)
Rounding Note    Patient Name: Todd Gonzales Date of Encounter: 12/21/2023  [facility] Cardiologist: Bryan Lemma, MD    Subjective   Patient is somnolent on exam. Remains on 3 L supplemental O2, no longer requiring BIPAP. Hypotensive overnight causing nurses to hold of his rate controlling medications. Remains in atrial fibrillation with rates 90-110 bpm.   Inpatient Medications    Scheduled Meds:  apixaban  5 mg Oral BID   furosemide  40 mg Intravenous BID   Continuous Infusions:  diltiazem (CARDIZEM) infusion Stopped (12/21/23 0030)   PRN Meds:    Vital Signs    Vitals:   12/21/23 0400 12/21/23 0415 12/21/23 0539 12/21/23 0715  BP: 98/74     Pulse: (!) 118 (!) 115  67  Resp: 19 (!) 26  (!) 21  Temp:   98.4 F (36.9 C)   TempSrc:   Oral   SpO2: (!) 82% 93%  95%  Weight:      Height:        Intake/Output Summary (Last 24 hours) at 12/21/2023 0752 Last data filed at 12/20/2023 1238 Gross per 24 hour  Intake --  Output 400 ml  Net -400 ml      12/20/2023   11:10 AM 10/31/2023    9:38 AM 10/03/2023    9:52 AM  Last 3 Weights  Weight (lbs) 240 lb 240 lb 241 lb  Weight (kg) 108.863 kg 108.863 kg 109.317 kg      Telemetry    Atrial fibrillation rates 90-110 bpm on average, up to 140s - Personally Reviewed  Physical Exam   GEN: No acute distress.   Neck: Unable to assess JVD Cardiac: IRIR, tachycardic, no murmurs, rubs, or gallops.  Respiratory: Coarse breath sounds bilaterally GI: Soft, nontender, non-distended  MS: 1 + LE edema; No deformity. Neuro:  Nonfocal  Psych: Normal affect   Labs    High Sensitivity Troponin:   Recent Labs  Lab 12/20/23 1122 12/20/23 1357  TROPONINIHS 26* 39*     Chemistry Recent Labs  Lab 12/20/23 1122 12/20/23 1356  NA 140  --   K 4.7  --   CL 103  --   CO2 28  --   GLUCOSE 137*  --   BUN 19  --   CREATININE 1.18  --   CALCIUM 8.9  --   MG  --  2.0  PROT 6.6  --   ALBUMIN 3.7  --   AST  21  --   ALT 30  --   ALKPHOS 47  --   BILITOT 1.6*  --   GFRNONAA >60  --   ANIONGAP 9  --     Lipids No results for input(s): "CHOL", "TRIG", "HDL", "LABVLDL", "LDLCALC", "CHOLHDL" in the last 168 hours.  Hematology Recent Labs  Lab 12/20/23 1122  WBC 9.4  RBC 4.40  HGB 14.0  HCT 44.8  MCV 101.8*  MCH 31.8  MCHC 31.3  RDW 13.8  PLT 177   Thyroid No results for input(s): "TSH", "FREET4" in the last 168 hours.  BNP Recent Labs  Lab 12/20/23 1122  BNP 625.5*    DDimer  Recent Labs  Lab 12/20/23 1357  DDIMER 0.31     Radiology    DG Chest 2 View Result Date: 12/20/2023 CLINICAL DATA:  Shortness of breath. EXAM: CHEST - 2 VIEW COMPARISON:  July 28, 2023. FINDINGS: Stable cardiomediastinal silhouette. Hypoinflation of the lungs is noted with minimal bibasilar  subsegmental atelectasis. Bony thorax is unremarkable. IMPRESSION: Hypoinflation of the lungs with minimal bibasilar subsegmental atelectasis. Electronically Signed   By: Lupita Raider M.D.   On: 12/20/2023 13:55    Cardiac Studies   08/28/2023 Coronary CTA Consolidation or volume loss at the lung bases. Otherwise no significant extracardiac incidental findings identified.   07/18/2023 Long term monitor   ~3-day Zio patch monitor August 2024.   Predominant rhythm is sinus rhythm with first AV block.  Rate range 50 to 88 bpm and average 66 bpm.   Rare PACs and PVCs noted.   1 short atrial run of 4 beats with a rate range of 120-138 bpm and average of 130 bpm.  Lasted 2.1 seconds.   Patient noted symptoms with sinus rhythm and a PAC.   No Sustained Arrhythmias: Atrial Tachycardia (AT), Supraventricular Tachycardia (SVT), Atrial Fibrillation (A-Fib), Atrial Flutter (A-Flutter), Sustained Ventricular Tachycardia (VT)   04/15/2023 TTE  1. Left ventricular ejection fraction, by estimation, is 60 to 65%. The  left ventricle has normal function. The left ventricle has no regional  wall motion abnormalities. There  is mild left ventricular hypertrophy.   2. Right ventricular systolic function is normal. The right ventricular  size is normal.   3. Trivial mitral valve regurgitation.   4. The aortic valve is tricuspid. Aortic valve regurgitation is not  visualized. Aortic valve sclerosis/calcification is present, without any  evidence of aortic stenosis.   Patient Profile     Todd Gonzales is a 83 y.o. male with a hx of paroxysmal atrial fibrillation, hypertension, hyperlipidemia, and reported h/o CAD, admitted 1/15, who we are seeing for the evaluation of CHF exacerbation and atrial fibrillation with RVR.  Assessment & Plan    Atrial fibrillation with RVR - History of paroxysmal atrial fibrillation with RVR since hospitalization 04/2023 with similar symptoms. Treated with diltiazem infusion followed by amiodarone infusion. Zio monitor 05/2023 showed predominantly sinus rhythm with a first degree AV block and average HR 66. No sustained arrhythmias or recurrent atrial fibrillation were noted so amiodarone was d/c'd.  - Presented 1/15 with worsening shortness of breath x2 days, found to be in atrial fibrillation with RVR, rates up to 150 bpm - Given IV metoprolol tartrate 5 mg with with rates dropping to 40-50 bpm although still in atrial fibrillation. Rates increased again into 140s and he was subsequently given 2.5 mg metoprolol with rates improving to 110-120 bpm.  - Remains in atrial fibrillation with rates elevated overnight up to 140s - Started on IV digoxin yesterday 1/15, received 2 doses with rates improving to 90-110 bpm, then held due to hypotension - Will start digoxin oral 0.25 mg daily for rate and rhythm control - Patient has been noncompliant with Eliquis, only taking once per day - Tentatively plan for TEE DCCV tomorrow - Continue Eliquis 5 mg BID  Acute on chronic HFpEF - Most recent echo 04/2023 shows LVEF 60-65% with mild left ventricular hypertrophy - BNP 625 - Repeat echo ordered  -  Weight yesterday 240 lbs dry weight per Dr. Elissa Hefty most recent clinic note is ~245 lbs - I/Os poorly recorded - Cr 1.15 - Patient appears volume overloaded on exam - Continue IV Lasix 40 mg BID - Continue to monitor kidney function, strict I/Os, and daily weights  Acute on chronic respiratory failure with hypoxia and hypercapnia - VBG initially with decompensated respiratory acidosis requiring BIPAP, patient now on nasal cannula 3 L - Management per IM  CAD Elevated troponin -  Coronary CTA 04/2023 showed mild to moderate non-occlusive disease - Troponin 26>39 in the setting of atrial fibrillation with RVR, suspect demand ischemia - EKG without ischemic changes - Chest pain free - No plan for further ischemic evaluation at this time - Statin intolerant, most recent LDL 58  Hypertension - PTA blood pressure medications held in the setting of hypotension  For questions or updates, please contact [facility] Please consult [email or web address] for contact info under      Signed, Orion Crook, PA-C  12/21/2023, 7:52 AM

## 2023-12-21 NOTE — ED Notes (Signed)
Cardiology at bedside.

## 2023-12-22 ENCOUNTER — Inpatient Hospital Stay: Payer: Medicare Other | Admitting: Anesthesiology

## 2023-12-22 ENCOUNTER — Encounter
Admission: EM | Disposition: A | Discharge status: Home Health | Payer: Self-pay | Source: Skilled Nursing Facility | Attending: Internal Medicine

## 2023-12-22 ENCOUNTER — Inpatient Hospital Stay (HOSPITAL_COMMUNITY)
Admit: 2023-12-22 | Discharge: 2023-12-22 | Disposition: A | Discharge status: Home / Self Care | Payer: Medicare Other | Attending: Cardiology | Admitting: Cardiology

## 2023-12-22 DIAGNOSIS — I340 Nonrheumatic mitral (valve) insufficiency: Secondary | ICD-10-CM

## 2023-12-22 DIAGNOSIS — J9610 Chronic respiratory failure, unspecified whether with hypoxia or hypercapnia: Secondary | ICD-10-CM

## 2023-12-22 DIAGNOSIS — I4891 Unspecified atrial fibrillation: Secondary | ICD-10-CM

## 2023-12-22 DIAGNOSIS — I361 Nonrheumatic tricuspid (valve) insufficiency: Secondary | ICD-10-CM

## 2023-12-22 DIAGNOSIS — J9601 Acute respiratory failure with hypoxia: Secondary | ICD-10-CM | POA: Diagnosis not present

## 2023-12-22 DIAGNOSIS — I5033 Acute on chronic diastolic (congestive) heart failure: Secondary | ICD-10-CM | POA: Diagnosis not present

## 2023-12-22 DIAGNOSIS — I4892 Unspecified atrial flutter: Secondary | ICD-10-CM | POA: Diagnosis not present

## 2023-12-22 DIAGNOSIS — I480 Paroxysmal atrial fibrillation: Secondary | ICD-10-CM | POA: Diagnosis not present

## 2023-12-22 DIAGNOSIS — J9621 Acute and chronic respiratory failure with hypoxia: Secondary | ICD-10-CM

## 2023-12-22 DIAGNOSIS — I2510 Atherosclerotic heart disease of native coronary artery without angina pectoris: Secondary | ICD-10-CM

## 2023-12-22 DIAGNOSIS — I4819 Other persistent atrial fibrillation: Secondary | ICD-10-CM | POA: Diagnosis not present

## 2023-12-22 DIAGNOSIS — I509 Heart failure, unspecified: Secondary | ICD-10-CM

## 2023-12-22 HISTORY — PX: TEE WITHOUT CARDIOVERSION: SHX5443

## 2023-12-22 HISTORY — PX: CARDIOVERSION: SHX1299

## 2023-12-22 LAB — VITAMIN D 25 HYDROXY (VIT D DEFICIENCY, FRACTURES): Vit D, 25-Hydroxy: 31.72 ng/mL (ref 30–100)

## 2023-12-22 LAB — VITAMIN B12: Vitamin B-12: 378 pg/mL (ref 180–914)

## 2023-12-22 LAB — BASIC METABOLIC PANEL WITH GFR
Anion gap: 7 (ref 5–15)
BUN: 19 mg/dL (ref 8–23)
CO2: 32 mmol/L (ref 22–32)
Calcium: 7.5 mg/dL — ABNORMAL LOW (ref 8.9–10.3)
Chloride: 107 mmol/L (ref 98–111)
Creatinine, Ser: 0.75 mg/dL (ref 0.61–1.24)
GFR, Estimated: >60 mL/min
Glucose, Bld: 91 mg/dL (ref 70–99)
Potassium: 3.2 mmol/L — ABNORMAL LOW (ref 3.5–5.1)
Sodium: 146 mmol/L — ABNORMAL HIGH (ref 135–145)

## 2023-12-22 LAB — CBC
HCT: 40.5 % (ref 39.0–52.0)
Hemoglobin: 12.7 g/dL — ABNORMAL LOW (ref 13.0–17.0)
MCH: 32.3 pg (ref 26.0–34.0)
MCHC: 31.4 g/dL (ref 30.0–36.0)
MCV: 103.1 fL — ABNORMAL HIGH (ref 80.0–100.0)
Platelets: 165 10*3/uL (ref 150–400)
RBC: 3.93 MIL/uL — ABNORMAL LOW (ref 4.22–5.81)
RDW: 13.3 % (ref 11.5–15.5)
WBC: 8.4 10*3/uL (ref 4.0–10.5)
nRBC: 0 % (ref 0.0–0.2)

## 2023-12-22 LAB — ECHO TEE

## 2023-12-22 LAB — PHOSPHORUS: Phosphorus: 2.5 mg/dL (ref 2.5–4.6)

## 2023-12-22 LAB — MAGNESIUM: Magnesium: 1.9 mg/dL (ref 1.7–2.4)

## 2023-12-22 SURGERY — ECHOCARDIOGRAM, TRANSESOPHAGEAL
Anesthesia: General

## 2023-12-22 SURGERY — CARDIOVERSION
Anesthesia: General

## 2023-12-22 MED ORDER — POLYSACCHARIDE IRON COMPLEX 150 MG PO CAPS
150.0000 mg | ORAL_CAPSULE | Freq: Every day | ORAL | Status: DC
Start: 2023-12-22 — End: 2024-01-08
  Administered 2023-12-22 – 2024-01-08 (×18): 150 mg via ORAL
  Filled 2023-12-22 (×19): qty 1

## 2023-12-22 MED ORDER — AMIODARONE HCL 200 MG PO TABS
400.0000 mg | ORAL_TABLET | Freq: Two times a day (BID) | ORAL | Status: DC
  Administered 2023-12-22 – 2023-12-27 (×12): 400 mg via ORAL
  Filled 2023-12-22 (×12): qty 2

## 2023-12-22 MED ORDER — SALINE SPRAY 0.65 % NA SOLN
1.0000 | NASAL | Status: DC | PRN
  Administered 2023-12-22 – 2024-01-01 (×2): 1 via NASAL
  Filled 2023-12-22: qty 44

## 2023-12-22 MED ORDER — POTASSIUM CHLORIDE CRYS ER 20 MEQ PO TBCR
40.0000 meq | EXTENDED_RELEASE_TABLET | ORAL | Status: AC
  Administered 2023-12-22 (×2): 40 meq via ORAL
  Filled 2023-12-22 (×2): qty 2

## 2023-12-22 MED ORDER — BUTAMBEN-TETRACAINE-BENZOCAINE 2-2-14 % EX AERO
INHALATION_SPRAY | CUTANEOUS | Status: AC
  Filled 2023-12-22: qty 5

## 2023-12-22 MED ORDER — MELATONIN 5 MG PO TABS
5.0000 mg | ORAL_TABLET | Freq: Once | ORAL | Status: AC
  Administered 2023-12-22: 5 mg via ORAL
  Filled 2023-12-22 (×2): qty 1

## 2023-12-22 MED ORDER — LIDOCAINE HCL (PF) 2 % IJ SOLN
INTRAMUSCULAR | Status: AC
  Filled 2023-12-22: qty 5

## 2023-12-22 MED ORDER — AMIODARONE HCL 200 MG PO TABS
200.0000 mg | ORAL_TABLET | Freq: Every day | ORAL | Status: DC
Start: 2024-01-05 — End: 2023-12-28

## 2023-12-22 MED ORDER — LIDOCAINE VISCOUS HCL 2 % MT SOLN
OROMUCOSAL | Status: AC
  Filled 2023-12-22: qty 15

## 2023-12-22 MED ORDER — SODIUM CHLORIDE 0.9% FLUSH: 3.0000 mL | INTRAVENOUS | Status: DC | PRN

## 2023-12-22 MED ORDER — VITAMIN C 500 MG PO TABS
500.0000 mg | ORAL_TABLET | Freq: Every day | ORAL | Status: DC
  Administered 2023-12-22 – 2024-01-08 (×18): 500 mg via ORAL
  Filled 2023-12-22 (×18): qty 1

## 2023-12-22 MED ORDER — SODIUM CHLORIDE 0.9% FLUSH
3.0000 mL | Freq: Two times a day (BID) | INTRAVENOUS | Status: DC
Start: 2023-12-22 — End: 2023-12-22

## 2023-12-22 MED ORDER — PROPOFOL 10 MG/ML IV BOLUS
INTRAVENOUS | Status: DC | PRN
  Administered 2023-12-22: 30 mg via INTRAVENOUS
  Administered 2023-12-22: 20 mg via INTRAVENOUS
  Administered 2023-12-22: 10 mg via INTRAVENOUS
  Administered 2023-12-22: 30 mg via INTRAVENOUS

## 2023-12-22 MED ORDER — PROPOFOL 10 MG/ML IV BOLUS
INTRAVENOUS | Status: AC
  Filled 2023-12-22: qty 60

## 2023-12-22 MED ORDER — AMIODARONE HCL 200 MG PO TABS: 200.0000 mg | ORAL_TABLET | Freq: Two times a day (BID) | ORAL | Status: DC

## 2023-12-22 NOTE — Progress Notes (Signed)
Progress Note  Patient Name: Todd Gonzales Date of Encounter: 12/22/2023  Primary Cardiologist: Todd Gonzales  Subjective   He remains in A-fib with ventricular rates in the 90s to 1 teens bpm.  No chest pain, dyspnea, palpitations, dizziness, presyncope, or syncope.  He is n.p.o. for TEE guided DCCV this afternoon.  Inpatient Medications    Scheduled Meds:  apixaban  5 mg Oral BID   arformoterol  15 mcg Nebulization BID   vitamin C  500 mg Oral Daily   digoxin  0.25 mg Oral Daily   iron polysaccharides  150 mg Oral Daily   potassium chloride  40 mEq Oral Q4H   Continuous Infusions:  sodium chloride 20 mL/hr at 12/22/23 0816   diltiazem (CARDIZEM) infusion Stopped (12/21/23 0030)   PRN Meds: ipratropium-albuterol   Vital Signs    Vitals:   12/22/23 0530 12/22/23 0600 12/22/23 0630 12/22/23 0809  BP: 114/78 104/75 106/68 113/72  Pulse: 65 99 65   Resp: (!) 24 19 (!) 23 20  Temp:    99.3 F (37.4 C)  TempSrc:    Oral  SpO2: 95% 97% 99% 91%  Weight:      Height:       No intake or output data in the 24 hours ending 12/22/23 0919 Filed Weights   12/20/23 1110  Weight: 108.9 kg    Telemetry    A-fib with PVCs versus aberrancy with ventricular rates in the 90s to 1 teens bpm - Personally Reviewed  ECG    No new tracings - Personally Reviewed  Physical Exam   GEN: No acute distress.   Neck: No JVD. Cardiac: Mildly tachycardic, IRIR, no murmurs, rubs, or gallops.  Respiratory: Clear to auscultation bilaterally.  GI: Soft, nontender, non-distended.   MS: No edema; No deformity. Neuro:  Alert and oriented x 3; Nonfocal.  Psych: Normal affect.  Labs    Chemistry Recent Labs  Lab 12/20/23 1122 12/21/23 0812 12/22/23 0607  NA 140 143 146*  K 4.7 4.3 3.2*  CL 103 100 107  CO2 28 32 32  GLUCOSE 137* 115* 91  BUN 19 24* 19  CREATININE 1.18 1.15 0.75  CALCIUM 8.9 8.3* 7.5*  PROT 6.6  --   --   ALBUMIN 3.7  --   --   AST 21  --   --   ALT 30  --   --    ALKPHOS 47  --   --   BILITOT 1.6*  --   --   GFRNONAA >60 >60 >60  ANIONGAP 9 11 7      Hematology Recent Labs  Lab 12/20/23 1122 12/22/23 0607  WBC 9.4 8.4  RBC 4.40 3.93*  HGB 14.0 12.7*  HCT 44.8 40.5  MCV 101.8* 103.1*  MCH 31.8 32.3  MCHC 31.3 31.4  RDW 13.8 13.3  PLT 177 165    Cardiac EnzymesNo results for input(s): "TROPONINI" in the last 168 hours. No results for input(s): "TROPIPOC" in the last 168 hours.   BNP Recent Labs  Lab 12/20/23 1122  BNP 625.5*     DDimer  Recent Labs  Lab 12/20/23 1357  DDIMER 0.31     Radiology    DG Chest 2 View Result Date: 12/20/2023 IMPRESSION: Hypoinflation of the lungs with minimal bibasilar subsegmental atelectasis. Electronically Signed   By: Lupita Raider M.D.   On: 12/20/2023 13:55    Cardiac Studies   2D echo 12/21/2023: 1. Left ventricular ejection fraction, by estimation,  is 60 to 65%. The  left ventricle has normal function. The left ventricle has no regional  wall motion abnormalities. There is mild left ventricular hypertrophy.  Left ventricular diastolic parameters  are indeterminate.   2. Right ventricular systolic function is normal. The right ventricular  size is normal. There is normal pulmonary artery systolic pressure. The  estimated right ventricular systolic pressure is 29.9 mmHg.   3. The mitral valve is normal in structure. No evidence of mitral valve  regurgitation. No evidence of mitral stenosis. Moderate mitral annular  calcification.   4. The aortic valve is normal in structure. Aortic valve regurgitation is  not visualized. No aortic stenosis is present.   5. The inferior vena cava is normal in size with greater than 50%  respiratory variability, suggesting right atrial pressure of 3 mmHg.   Patient Profile     83 y.o. male with history of nonobstructive CAD, PAF, HFpEF, HTN, and HLD who is being evaluated for A-fib with RVR and HFpEF.  Assessment & Plan    1.  A-fib with  RVR: -Remains in A-fib with difficult to control ventricular rates -Relative hypotension has limited use of AV nodal blocking medications -Has been maintained on digoxin this admission with suboptimal ventricular rate control -N.p.o. for TEE guided DCCV today (patient has been noncompliant with apixaban, only taking once per day) -Continue apixaban 5 mg twice daily -Resume metoprolol as BP allows  2.  Nonobstructive CAD with elevated troponin: -No symptoms suggestive of angina -Mildly elevated high-sensitivity troponin in the setting of supply/demand ischemia with A-fib with RVR -Recent coronary CTA showed moderate nonocclusive CAD -Continue apixaban complains of aspirin as outlined above -Echo with preserved LV systolic function this admission -No plans for inpatient ischemic evaluation at this time  3.  Acute on chronic HFpEF: -Likely in the setting of A-fib with RVR -Volume status improved -Hold IV Lasix -Escalate GDMT as BP allows  4.  HTN: -Blood pressure has been soft, though stable -PTA metoprolol held in the setting of hypotension  5.  Hypokalemia: -Replete potassium to goal 4.0   Informed Consent   Shared Decision Making/Informed Consent{  The risks [stroke, cardiac arrhythmias rarely resulting in the need for a temporary or permanent pacemaker, skin irritation or burns, esophageal damage, perforation (1:10,000 risk), bleeding, pharyngeal hematoma as well as other potential complications associated with conscious sedation including aspiration, arrhythmia, respiratory failure and death], benefits (treatment guidance, restoration of normal sinus rhythm, diagnostic support) and alternatives of a transesophageal echocardiogram guided cardioversion were discussed in detail with Todd Gonzales and he is willing to proceed.      For questions or updates, please contact [facility] Please consult [email or web address] for contact info under Cardiology/STEMI.    Signed, Todd Listen,  PA-C [facility] Pager: [phone] 12/22/2023, 9:19 AM

## 2023-12-22 NOTE — Progress Notes (Signed)
Transition of Care [facility]) - Inpatient Brief Assessment   Patient Details  Name: Todd Gonzales MRN: [id number] Date of Birth: [birth year]  Transition of Care [facility]) CM/SW Contact:    Truddie Hidden, RN Phone Number: 12/22/2023, 3:55 PM   Clinical Narrative: TOC continuing to follow patient's progress throughout discharge planning.   Transition of Care Asessment: Insurance and Status: Insurance coverage has been reviewed Patient has primary care physician: Yes   Prior level of function:: Independent Prior/Current Home Services: No current home services Social Drivers of Health Review: SDOH reviewed no interventions necessary Readmission risk has been reviewed: Yes Transition of care needs: no transition of care needs at this time

## 2023-12-22 NOTE — Anesthesia Preprocedure Evaluation (Signed)
Anesthesia Evaluation  Patient identified by MRN, date of birth, ID band Patient awake    Reviewed: Allergy & Precautions, NPO status , Patient's Chart, lab work & pertinent test results  Airway Mallampati: III  TM Distance: >3 FB Neck ROM: full    Dental  (+) Partial Lower, Partial Upper   Pulmonary neg pulmonary ROS, shortness of breath and with exertion   Pulmonary exam normal  + decreased breath sounds      Cardiovascular Exercise Tolerance: Poor hypertension, Pt. on medications + CAD and +CHF  negative cardio ROS Normal cardiovascular exam Rhythm:Regular Rate:Normal     Neuro/Psych negative neurological ROS  negative psych ROS   GI/Hepatic negative GI ROS, Neg liver ROS, hiatal hernia,,,  Endo/Other  negative endocrine ROS  Class 3 obesity  Renal/GU negative Renal ROS  negative genitourinary   Musculoskeletal  (+) Arthritis ,    Abdominal  (+) + obese  Peds negative pediatric ROS (+)  Hematology negative hematology ROS (+)   Anesthesia Other Findings Past Medical History: 03/28/2023: Actinic keratosis     Comment:  glabella, needs LN2 No date: Aortic atherosclerosis (HCC)     Comment:  a. Noted on CT 02/2023. No date: Arthritis 03/28/2023: Basal cell carcinoma     Comment:  right paranasal, no tx, cleared with bx No date: BPH (benign prostatic hyperplasia)     Comment:  nocturia x4 at baseline No date: Coronary artery calcification seen on CT scan No date: H/O hiatal hernia No date: Hemorrhoids No date: History of echocardiogram     Comment:  a. 04/2023 Echo: EF 60-65%, no rwma, mild LVH, nl RV fxn,              triv MR. No date: Hyperlipidemia No date: Hypertension     Comment:  Dr Wylene Simmer- LOV with clearance 3/13 on chart No date: IGT (impaired glucose tolerance)     Comment:  last AIC 5.5- diet controlled- per office note 3/13 04/14/2023: Paroxysmal atrial fibrillation (HCC)     Comment:  a.  04/2023 Admitted with A-fib RVR.  Normal echo.                Discharged on amiodarone; b. 05/2023 Zio: Predominantly               sinus rhythm at 66 (50-88).  First-degree AV block.  4               beats of NSVT.  No sustained arrhythmias.  Amiodarone               discontinued. 02/10/2005: SCC (squamous cell carcinoma)     Comment:  scc in situ-left forearm. 02/10/2005: SCCA (squamous cell carcinoma) of skin     Comment:  Right Outer Eye (in situ) (curet and excision) 02/13/2006: SCCA (squamous cell carcinoma) of skin     Comment:  Right Ear (well diff) (curet and 5FU) 04/05/2006: SCCA (squamous cell carcinoma) of skin     Comment:  Left Ear Bowl (in situ) (tx p bx) 07/06/2010: SCCA (squamous cell carcinoma) of skin     Comment:  Right Cheek (in situ) (curet and zyclara) 04/27/2009: SCCA (squamous cell carcinoma) of skin     Comment:  Left Upper Forearm (in situ) (curet 5FU) 08/28/2014: SCCA (squamous cell carcinoma) of skin     Comment:  Right Cheek (well diff) 03/21/2016: SCCA (squamous cell carcinoma) of skin     Comment:  Left Jawline Ant (well diff) (curet and 5FU) 01/19/2017:  SCCA (squamous cell carcinoma) of skin     Comment:  Right Hand (in situ) (tx p bx) 03/04/1840: SCCA (squamous cell carcinoma) of skin     Comment:  Left Jawline Inf (in situ) (curet and 5FU) 12/23/2020: SCCA (squamous cell carcinoma) of skin     Comment:  Left Forehead (well diff) (tx p bx) 12/23/2020: SCCA (squamous cell carcinoma) of skin     Comment:  Left Submandibular Area (in situ) (tx p bx) No date: Seasonal allergies No date: Spinal stenosis 08/31/2020: Squamous cell carcinoma of skin     Comment:  in situ-left jawline cheek 04/27/2021: Squamous cell carcinoma of skin     Comment:  infil- left nasal sidewall(MOHS) 03/28/2023: Squamous cell carcinoma of skin     Comment:  Left medial cheek above nasolabial, not treated, but               cleared with bx 08/16/2023: Squamous cell carcinoma of  skin     Comment:  Left lateral upper pretibia, [facility] 04/27/2021: Squamous cell carcinoma of skin of face     Comment:  infil- Left Temporal scalp (MOHS No date: UTI (lower urinary tract infection)     Comment:  required admission to [facility]  Past Surgical History: 2008: BACK SURGERY     Comment:  Laminectomy L4-5 No date: JOINT REPLACEMENT; Bilateral     Comment:  bil No date: KNEE ARTHROSCOPY     Comment:  right 11/20/2018: LUMBAR LAMINECTOMY/DECOMPRESSION MICRODISCECTOMY;  Bilateral     Comment:  Procedure: Bilateral Thoracic Twelve to Lumbar One               Laminectomy;  Surgeon: Barnett Abu, MD;  Location: [facility]               OR;  Service: Neurosurgery;  Laterality: Bilateral;                posterior 03/20/2012: TOTAL HIP ARTHROPLASTY     Comment:  Procedure: TOTAL HIP ARTHROPLASTY ANTERIOR APPROACH;                Surgeon: Shelda Pal, MD;  Location: [facility] ORS;  Service:              Orthopedics;  Laterality: Left;  BMI    Body Mass Index: 34.44 kg/m      Reproductive/Obstetrics negative OB ROS                             Anesthesia Physical Anesthesia Plan  ASA: 4  Anesthesia Plan: General   Post-op Pain Management:    Induction: Intravenous  PONV Risk Score and Plan: Propofol infusion and TIVA  Airway Management Planned: Natural Airway and Nasal Cannula  Additional Equipment:   Intra-op Plan:   Post-operative Plan:   Informed Consent: I have reviewed the patients History and Physical, chart, labs and discussed the procedure including the risks, benefits and alternatives for the proposed anesthesia with the patient or authorized representative who has indicated his/her understanding and acceptance.     Dental Advisory Given  Plan Discussed with: CRNA  Anesthesia Plan Comments:        Anesthesia Quick Evaluation

## 2023-12-22 NOTE — Anesthesia Procedure Notes (Signed)
Procedure Name: MAC Date/Time: 12/22/2023 12:47 PM  Performed by: Nelle Don, CRNAPre-anesthesia Checklist: Patient identified, Emergency Drugs available, Suction available and Patient being monitored Oxygen Delivery Method: Nasal cannula

## 2023-12-22 NOTE — Progress Notes (Signed)
[facility] Progress Note  Patient: Todd Gonzales    ZOX:[id number]  DOA: 12/20/2023     Date of Service: the patient was seen and examined on 12/22/2023  Chief Complaint  Patient presents with   Shortness of Breath   Brief hospital course: Todd Gonzales is a 83 y.o. male with medical history significant of morbid obesity, HFpEF, CAD, atrial fibrillation presenting with acute respiratory failure with hypoxia and hypercarbia, acute on chronic HFpEF.  Patient reports increased work of breathing over the past week or so.  Noted baseline HFpEF.  Reports intermittent compliance with Lasix.  Mild orthopnea PND as well as lower extremity swelling.  No chest pain.  No nausea or vomiting.  No focal hemiparesis or confusion.  Shortness of breath has become more pronounced.  Currently lives at assisted living facility.  Was redirected to the ER for worsening shortness of breath.  Noted to be satting at 82% on room air upon EMS arrival. Presented to ER afebrile, HR 140s-50s, BP stable. Initially in 80s on RA. Placed on BIPAP. VBG w/ hypercarbia. BNP 600s. Cr 1.2. T bili 1.6.    Assessment and Plan:  # Acute on chronic respiratory failure with hypoxia and hypercapnia Decompensated resp failure requiring BIPAP in setting of acute on chronic HFpEF  Noted decompensated resp acidosis on presentation  No noted prior hx/o tobacco abuse or obstructive lung disease  Suspect element of OHS/sleep apnea-noted large body habitus  D dimer  0.31 negative Cont w/ BIPAP prn Monitor Started Brovana nebulizer twice a day Started DuoNeb every 6 hourly as needed Patient needs sleep study and pulmonary follow-up as an outpatient for PFTs  # Acute on chronic heart failure with preserved ejection fraction (HFpEF) 2D ECHO 04/2023 w/ EF 60-65%  + hypercarbic resp failure on presentation w/ volume overload  Noted baseline interimittent use of lasix at facility  BNP 625- well above baseline, LE edema resolved   CXR w/  cardiomegaly and vascular congestion preliminarily  S/p IV lasix  Cardiology consulted   Atrial fibrillation with rapid ventricular response Initial HR into 150s, Improved to 50s s/p IV metoprolol x 1  continue eliquis  S/p digoxin 0.5 mg p.o. daily and S/p  Cardizem IV infusion Monitor HR w/ BB use  Cardiology consulted, sp TEE and cardioversion 1/17 Started amiodarone 400 mg p.o. twice daily for 7 days followed by 200 mg p.o. twice daily for 7 days followed by 20 mg p.o. daily as per cardiology  # Hypokalemia, potassium repleted.  # Coronary artery disease, non-occlusive Baseline CAD  No active chest pain  Monitor    # HTN (hypertension) Monitor BP and titrate home regimen  Patient became hypotensive, received digoxin and Cardizem IV infusion as above  # Iron-deficiency, transfer 78%, started oral iron supplement.   Obesity class I Body mass index is 34.44 kg/m.  Interventions:  Diet: Heart healthy diet, fluid restriction 1.5 L/day DVT Prophylaxis: Therapeutic Anticoagulation with Eliquis    Advance goals of care discussion: Full code  Family Communication: family was not present at bedside, at the time of interview.  The pt provided permission to discuss medical plan with the family. Opportunity was given to ask question and all questions were answered satisfactorily.   Disposition:  Pt is from Home, admitted with Resp failure and A.fib, s/p cardioversion done on 1/17 Continue to monitor on telemetry, patient can be discharged when cleared by cardiology in 1 to 2 days. Discharge plan to home, when cleared by cardiology.  Subjective: Patient was admitted overnight, patient breathing is getting better, patient is scheduled for cardioversion and TEE today. Denied any chest pain or palpitations.   Physical Exam: General: NAD, lying comfortably Appear in no distress, affect appropriate Eyes: PERRLA ENT: Oral Mucosa Clear, moist  Neck: no JVD,  Cardiovascular: S1  and S2 Present, no Murmur, irregular rhythm Respiratory: good respiratory effort, Bilateral Air entry equal and Decreased, no Crackles, no wheezes Abdomen: Bowel Sound present, Soft and no tenderness,  Skin: no rashes Extremities: no Pedal edema, no calf tenderness Neurologic: without any new focal findings Gait not checked due to patient safety concerns  Vitals:   12/22/23 1312 12/22/23 1315 12/22/23 1330 12/22/23 1516  BP: (!) 107/57 102/70 111/72 134/65  Pulse: 82 72 73 79  Resp: (!) 23 (!) 30 (!) 28 18  Temp:    98.3 F (36.8 C)  TempSrc:      SpO2: 95% 93% 99% 90%  Weight:      Height:        Intake/Output Summary (Last 24 hours) at 12/22/2023 1616 Last data filed at 12/22/2023 1502 Gross per 24 hour  Intake 141.19 ml  Output 200 ml  Net -58.81 ml   Filed Weights   12/20/23 1110  Weight: 108.9 kg    Data Reviewed: I have personally reviewed and interpreted daily labs, tele strips, imagings as discussed above. I reviewed all nursing notes, pharmacy notes, vitals, pertinent old records I have discussed plan of care as described above with RN and patient/family.  CBC: Recent Labs  Lab 12/20/23 1122 12/22/23 0607  WBC 9.4 8.4  NEUTROABS 7.9*  --   HGB 14.0 12.7*  HCT 44.8 40.5  MCV 101.8* 103.1*  PLT 177 165   Basic Metabolic Panel: Recent Labs  Lab 12/20/23 1122 12/20/23 1356 12/21/23 0812 12/22/23 0607  NA 140  --  143 146*  K 4.7  --  4.3 3.2*  CL 103  --  100 107  CO2 28  --  32 32  GLUCOSE 137*  --  115* 91  BUN 19  --  24* 19  CREATININE 1.18  --  1.15 0.75  CALCIUM 8.9  --  8.3* 7.5*  MG  --  2.0  --  1.9  PHOS  --   --   --  2.5    Studies: ECHO TEE Result Date: 12/22/2023    TRANSESOPHOGEAL ECHO REPORT   Patient Name:   Todd Gonzales Date of Exam: 12/22/2023 Medical Rec #:  [id number]     Height:       70.0 in Accession #:    [id number]    Weight:       240.0 lb Date of Birth:  12-29-1940     BSA:          2.255 m Patient Age:    82 years       BP:           133/87 mmHg Patient Gender: M             HR:           137 bpm. Exam Location:  [facility] Procedure: Transesophageal Echo, Cardiac Doppler and Color Doppler Indications:     Atrial Fibrillation I48.91  History:         Patient has prior history of Echocardiogram examinations, most                  recent 12/21/2023. Risk Factors:Hypertension and  Dyslipidemia.                  Paroxysmal Afib.  Sonographer:     Cristela Blue Referring Phys:  [id number] Debbe Odea Diagnosing Phys: Debbe Odea MD PROCEDURE: The transesophogeal probe was passed without difficulty through the esophogus of the patient. Sedation performed by different physician. The patient developed no complications during the procedure. A successful direct current cardioversion was  performed at 200 joules with 1 attempt.  IMPRESSIONS  1. Left ventricular ejection fraction, by estimation, is 60 to 65%. The left ventricle has normal function.  2. Right ventricular systolic function is normal. The right ventricular size is normal.  3. Left atrial size was mildly dilated. No left atrial/left atrial appendage thrombus was detected.  4. The mitral valve is normal in structure. Mild to moderate mitral valve regurgitation.  5. The aortic valve is tricuspid. Aortic valve regurgitation is mild. FINDINGS  Left Ventricle: Left ventricular ejection fraction, by estimation, is 60 to 65%. The left ventricle has normal function. The left ventricular internal cavity size was normal in size. Right Ventricle: The right ventricular size is normal. No increase in right ventricular wall thickness. Right ventricular systolic function is normal. Left Atrium: Left atrial size was mildly dilated. No left atrial/left atrial appendage thrombus was detected. Right Atrium: Right atrial size was normal in size. Pericardium: There is no evidence of pericardial effusion. Mitral Valve: The mitral valve is normal in structure. Mild to moderate mitral valve  regurgitation. Tricuspid Valve: The tricuspid valve is normal in structure. Tricuspid valve regurgitation is mild. Aortic Valve: The aortic valve is tricuspid. Aortic valve regurgitation is mild. Pulmonic Valve: The pulmonic valve was normal in structure. Pulmonic valve regurgitation is mild. Aorta: The aortic root is normal in size and structure. IAS/Shunts: No atrial level shunt detected by color flow Doppler. Debbe Odea MD Electronically signed by Debbe Odea MD Signature Date/Time: 12/22/2023/1:33:28 PM    Final     Scheduled Meds:  amiodarone  400 mg Oral BID   Followed by   Melene Muller ON 12/29/2023] amiodarone  200 mg Oral BID   Followed by   Melene Muller ON 01/05/2024] amiodarone  200 mg Oral Daily   apixaban  5 mg Oral BID   arformoterol  15 mcg Nebulization BID   vitamin C  500 mg Oral Daily   iron polysaccharides  150 mg Oral Daily   potassium chloride  40 mEq Oral Q4H   Continuous Infusions:   PRN Meds: ipratropium-albuterol  Time spent: 35 minutes  Author: Gillis Santa. MD [facility] 12/22/2023 4:16 PM  To reach On-call, see care teams to locate the attending and reach out to them via [email or web address]. If 7PM-7AM, please contact night-coverage If you still have difficulty reaching the attending provider, please page the [facility] (Director on Call) for [facility] on amion for assistance.

## 2023-12-22 NOTE — Progress Notes (Signed)
*  PRELIMINARY RESULTS* Echocardiogram Echocardiogram Transesophageal has been performed.  Todd Gonzales 12/22/2023, 1:07 PM

## 2023-12-22 NOTE — Transfer of Care (Signed)
Immediate Anesthesia Transfer of Care Note  Patient: Todd Gonzales  Procedure(s) Performed: TRANSESOPHAGEAL ECHOCARDIOGRAM (TEE) CARDIOVERSION  Patient Location:  [facility]  Anesthesia Type:MAC  Level of Consciousness: awake, alert , and oriented  Airway & Oxygen Therapy: Patient Spontanous Breathing and Patient connected to face mask oxygen  Post-op Assessment: Report given to RN, Post -op Vital signs reviewed and stable, and Patient moving all extremities X 4  Post vital signs: Reviewed and stable  Last Vitals:  Vitals Value Taken Time  BP 102/77   Temp    Pulse 79   Resp 12   SpO2 97     Last Pain:  Vitals:   12/22/23 1158  TempSrc: Oral  PainSc: 0-No pain         Complications: No notable events documented.

## 2023-12-22 NOTE — Procedures (Signed)
Transesophageal Echocardiogram and DC cardioversion:  Indication: atrial fibrillation  Time Out: Verified patient identification, verified procedure, site/side was marked, verified correct patient position, special equipment/implants available, medications/allergies/relevent history reviewed, required imaging and test results available.  Performed  Procedure: 10 ml of viscous lidocaine were given orally to provide local anesthesia to the oropharynx. The patient was positioned supine on the left side, bite block provided. The patient was sedated per anesthesia team.  Using digital technique an omniplane probe was advanced into the esophagus without incident.   See report in EPIC  for complete details: In brief, imaging revealed normal LV function with no RWMAs and no mural apical thrombus.  .  Estimated ejection fraction was 60%.  Right sided cardiac chambers were normal with no evidence of pulmonary hypertension.  Imaging of the septum showed no ASD or VSD 2D and color flow showed no PFO  The LA was well visualized in orthogonal views.  There was no thrombus in the LA and LA appendage   The descending thoracic aorta had no  mural aortic debris with no evidence of aneurysmal dilation or disection  Cardioversion procedure note For atrial fibrillation.  Patient placed on cardiac monitor, pulse oximetry, supplemental oxygen as necessary.   Sedation given: propofol IV per anesthesia Pacer pads placed anterior and posterior chest.  Cardioverted 1 time(s).   Cardioverted at  200J. Synchronized biphasic Converted to NSR  Evaluation: Findings: Post procedure EKG shows: NSR Complications: None Patient did tolerate procedure well.  Time Spent Directly with the Patient:  25 minutes   Debbe Odea, M.D.    Arlys John Agbor-Etang 12/22/2023 1:02 PM

## 2023-12-22 NOTE — Anesthesia Postprocedure Evaluation (Signed)
Anesthesia Post Note  Patient: Todd Gonzales  Procedure(s) Performed: TRANSESOPHAGEAL ECHOCARDIOGRAM (TEE) CARDIOVERSION  Patient location during evaluation: Phase II Anesthesia Type: General Level of consciousness: awake Pain management: satisfactory to patient Vital Signs Assessment: post-procedure vital signs reviewed and stable Respiratory status: spontaneous breathing Cardiovascular status: blood pressure returned to baseline Anesthetic complications: no   No notable events documented.   Last Vitals:  Vitals:   12/22/23 1312 12/22/23 1315  BP: (!) 107/57 102/70  Pulse: 82 72  Resp: (!) 23 (!) 30  Temp:    SpO2: 95% 93%    Last Pain:  Vitals:   12/22/23 1315  TempSrc:   PainSc: 0-No pain                 VAN STAVEREN,Laurian Edrington

## 2023-12-23 DIAGNOSIS — J9601 Acute respiratory failure with hypoxia: Secondary | ICD-10-CM | POA: Diagnosis not present

## 2023-12-23 DIAGNOSIS — R7989 Other specified abnormal findings of blood chemistry: Secondary | ICD-10-CM

## 2023-12-23 DIAGNOSIS — I2510 Atherosclerotic heart disease of native coronary artery without angina pectoris: Secondary | ICD-10-CM | POA: Diagnosis not present

## 2023-12-23 DIAGNOSIS — I4891 Unspecified atrial fibrillation: Secondary | ICD-10-CM | POA: Diagnosis not present

## 2023-12-23 DIAGNOSIS — I2583 Coronary atherosclerosis due to lipid rich plaque: Secondary | ICD-10-CM

## 2023-12-23 DIAGNOSIS — I5033 Acute on chronic diastolic (congestive) heart failure: Secondary | ICD-10-CM | POA: Diagnosis not present

## 2023-12-23 LAB — BASIC METABOLIC PANEL
Anion gap: 9 (ref 5–15)
BUN: 22 mg/dL (ref 8–23)
CO2: 31 mmol/L (ref 22–32)
Calcium: 8.4 mg/dL — ABNORMAL LOW (ref 8.9–10.3)
Chloride: 101 mmol/L (ref 98–111)
Creatinine, Ser: 0.8 mg/dL (ref 0.61–1.24)
GFR, Estimated: >60 mL/min (ref >60)
Glucose, Bld: 115 mg/dL — ABNORMAL HIGH (ref 70–99)
Potassium: 4.1 mmol/L (ref 3.5–5.1)
Sodium: 141 mmol/L (ref 135–145)

## 2023-12-23 LAB — CBC
HCT: 41.2 % (ref 39.0–52.0)
Hemoglobin: 13.5 g/dL (ref 13.0–17.0)
MCH: 32.1 pg (ref 26.0–34.0)
MCHC: 32.8 g/dL (ref 30.0–36.0)
MCV: 98.1 fL (ref 80.0–100.0)
Platelets: 178 10*3/uL (ref 150–400)
RBC: 4.2 MIL/uL — ABNORMAL LOW (ref 4.22–5.81)
RDW: 13.3 % (ref 11.5–15.5)
WBC: 7.3 10*3/uL (ref 4.0–10.5)
nRBC: 0 % (ref 0.0–0.2)

## 2023-12-23 LAB — MAGNESIUM: Magnesium: 2.1 mg/dL (ref 1.7–2.4)

## 2023-12-23 LAB — PHOSPHORUS: Phosphorus: 2.4 mg/dL — ABNORMAL LOW (ref 2.5–4.6)

## 2023-12-23 MED ORDER — VITAMIN D (ERGOCALCIFEROL) 1.25 MG (50000 UNIT) PO CAPS
50000.0000 [IU] | ORAL_CAPSULE | ORAL | Status: DC
  Administered 2023-12-23 – 2024-01-06 (×3): 50000 [IU] via ORAL
  Filled 2023-12-23 (×3): qty 1

## 2023-12-23 MED ORDER — BISACODYL 5 MG PO TBEC
10.0000 mg | DELAYED_RELEASE_TABLET | Freq: Every day | ORAL | Status: DC
  Administered 2023-12-24 – 2024-01-06 (×11): 10 mg via ORAL
  Filled 2023-12-23 (×14): qty 2

## 2023-12-23 MED ORDER — METOPROLOL TARTRATE 25 MG PO TABS
25.0000 mg | ORAL_TABLET | Freq: Two times a day (BID) | ORAL | Status: DC
  Administered 2023-12-23 – 2023-12-26 (×7): 25 mg via ORAL
  Filled 2023-12-23 (×7): qty 1

## 2023-12-23 MED ORDER — FUROSEMIDE 10 MG/ML IJ SOLN
40.0000 mg | Freq: Once | INTRAMUSCULAR | Status: AC
  Administered 2023-12-23: 40 mg via INTRAVENOUS
  Filled 2023-12-23: qty 4

## 2023-12-23 MED ORDER — CYANOCOBALAMIN 500 MCG PO TABS
500.0000 ug | ORAL_TABLET | Freq: Every day | ORAL | Status: DC
  Administered 2023-12-23 – 2024-01-08 (×17): 500 ug via ORAL
  Filled 2023-12-23 (×17): qty 1

## 2023-12-23 MED ORDER — BISACODYL 5 MG PO TBEC: 10.0000 mg | DELAYED_RELEASE_TABLET | Freq: Once | ORAL | Status: DC

## 2023-12-23 MED ORDER — BISACODYL 10 MG RE SUPP
10.0000 mg | Freq: Once | RECTAL | Status: AC
  Administered 2023-12-23: 10 mg via RECTAL
  Filled 2023-12-23: qty 1

## 2023-12-23 MED ORDER — BISACODYL 10 MG RE SUPP
10.0000 mg | Freq: Every day | RECTAL | Status: DC | PRN
  Administered 2023-12-31: 10 mg via RECTAL
  Filled 2023-12-23 (×3): qty 1

## 2023-12-23 MED ORDER — POLYETHYLENE GLYCOL 3350 17 G PO PACK
17.0000 g | PACK | Freq: Two times a day (BID) | ORAL | Status: DC
  Administered 2023-12-23 – 2024-01-06 (×13): 17 g via ORAL
  Filled 2023-12-23 (×28): qty 1

## 2023-12-23 MED ORDER — GABAPENTIN 300 MG PO CAPS
300.0000 mg | ORAL_CAPSULE | Freq: Every day | ORAL | Status: DC
  Administered 2023-12-23 – 2024-01-07 (×16): 300 mg via ORAL
  Filled 2023-12-23 (×16): qty 1

## 2023-12-23 MED ORDER — K PHOS MONO-SOD PHOS DI & MONO 155-852-130 MG PO TABS
500.0000 mg | ORAL_TABLET | Freq: Four times a day (QID) | ORAL | Status: AC
  Administered 2023-12-23: 500 mg via ORAL
  Filled 2023-12-23 (×2): qty 2

## 2023-12-23 NOTE — Progress Notes (Signed)
[facility] Progress Note  Patient: Todd Gonzales    VWU:[id number]  DOA: 12/20/2023     Date of Service: the patient was seen and examined on 12/23/2023  Chief Complaint  Patient presents with   Shortness of Breath   Brief hospital course: [facility] Gonzales is a 83 y.o. male with medical history significant of morbid obesity, HFpEF, CAD, atrial fibrillation presenting with acute respiratory failure with hypoxia and hypercarbia, acute on chronic HFpEF.  Patient reports increased work of breathing over the past week or so.  Noted baseline HFpEF.  Reports intermittent compliance with Lasix.  Mild orthopnea PND as well as lower extremity swelling.  No chest pain.  No nausea or vomiting.  No focal hemiparesis or confusion.  Shortness of breath has become more pronounced.  Currently lives at assisted living facility.  Was redirected to the ER for worsening shortness of breath.  Noted to be satting at 82% on room air upon EMS arrival. Presented to ER afebrile, HR 140s-50s, BP stable. Initially in 80s on RA. Placed on BIPAP. VBG w/ hypercarbia. BNP 600s. Cr 1.2. T bili 1.6.    Assessment and Plan:  # Acute respiratory failure with hypoxia and hypercapnia, Resolved  Decompensated resp failure requiring BIPAP in setting of acute on chronic HFpEF  Noted decompensated resp acidosis on presentation  No noted prior hx/o tobacco abuse or obstructive lung disease  Suspect element of OHS/sleep apnea-noted large body habitus  D dimer  0.31 negative Cont w/ BIPAP prn Monitor Started Brovana nebulizer twice a day Started DuoNeb every 6 hourly as needed Patient needs sleep study and pulmonary follow-up as an outpatient for PFTs  # Acute on chronic heart failure with preserved ejection fraction (HFpEF) 2D ECHO 04/2023 w/ EF 60-65%  + hypercarbic resp failure on presentation w/ volume overload  Noted baseline interimittent use of lasix at facility  BNP 625- well above baseline, LE edema resolved   CXR w/  cardiomegaly and vascular congestion preliminarily  S/p IV lasix  Cardiology consulted   Atrial fibrillation with rapid ventricular response Initial HR into 150s, Improved to 50s s/p IV metoprolol x 1  continue eliquis  S/p digoxin 0.5 mg p.o. daily and S/p  Cardizem IV infusion Monitor HR w/ BB use  Cardiology consulted, sp TEE and cardioversion 1/17 Started amiodarone 400 mg p.o. twice daily for 7 days followed by 200 mg p.o. twice daily for 7 days followed by 20 mg p.o. daily as per cardiology 1/18 started metoprolol 25 mg p.o. twice daily as per cardio  # Hypokalemia, potassium repleted.  # Coronary artery disease, non-occlusive Baseline CAD  No active chest pain  Monitor    # HTN (hypertension) Monitor BP and titrate home regimen  Patient became hypotensive, received digoxin and Cardizem IV infusion as above 1/18 started metoprolol 25 mg p.o. twice daily as per cardio  # Iron-deficiency, transfer 78%, started oral iron supplement. # Vitamin D level 31.7, at lower end, started vitamin D 55 units p.o. weekly. # Vitamin B12 level 378, goal <400, started B12 oral supplement.   Obesity class I Body mass index is 34.44 kg/m.  Interventions:  Diet: Heart healthy diet, fluid restriction 1.5 L/day DVT Prophylaxis: Therapeutic Anticoagulation with Eliquis    Advance goals of care discussion: Full code  Family Communication: family was not present at bedside, at the time of interview.  The pt provided permission to discuss medical plan with the family. Opportunity was given to ask question and all questions  were answered satisfactorily.   Disposition:  Pt is from [facility] (independent living [facility] ), admitted with Resp failure and A.fib, s/p cardioversion done on 1/17, Continue to monitor on telemetry. Discharge plan to [facility] vs SNF TBD after PT/OT eval. Stable to process dc in 1-2 days   Subjective: No significant events overnight, patient denies any worsening of shortness of  breath, feels improvement saturating well on room air.  Denies any chest pain or palpitations. Patient has constipation so to why he does not feel he good and also he has generalized weakness due to lying in the bed, has not ambulated in the hallway. High risk to discharge today, awaiting PT and OT eval, notified to [facility] to check with [facility] so that we can start DC process.   Physical Exam: General: NAD, lying comfortably Appear in no distress, affect appropriate Eyes: PERRLA ENT: Oral Mucosa Clear, moist  Neck: no JVD,  Cardiovascular: S1 and S2 Present, no Murmur, Respiratory: good respiratory effort, Bilateral Air entry equal and Decreased, no Crackles, no wheezes Abdomen: Bowel Sound present, Soft and no tenderness, obese Skin: no rashes Extremities: no Pedal edema, no calf tenderness Neurologic: without any new focal findings Gait not checked due to patient safety concerns  Vitals:   12/23/23 0853 12/23/23 1231 12/23/23 1257 12/23/23 1426  BP: 130/67 137/89    Pulse: 74 74  64  Resp: 20 20    Temp: 98.9 F (37.2 C) 98 F (36.7 C)    TempSrc:      SpO2: 90% (!) 88% 95% 92%  Weight:      Height:        Intake/Output Summary (Last 24 hours) at 12/23/2023 1452 Last data filed at 12/23/2023 1255 Gross per 24 hour  Intake 41.19 ml  Output 500 ml  Net -458.81 ml   Filed Weights   12/20/23 1110  Weight: 108.9 kg    Data Reviewed: I have personally reviewed and interpreted daily labs, tele strips, imagings as discussed above. I reviewed all nursing notes, pharmacy notes, vitals, pertinent old records I have discussed plan of care as described above with RN and patient/family.  CBC: Recent Labs  Lab 12/20/23 1122 12/22/23 0607 12/23/23 0355  WBC 9.4 8.4 7.3  NEUTROABS 7.9*  --   --   HGB 14.0 12.7* 13.5  HCT 44.8 40.5 41.2  MCV 101.8* 103.1* 98.1  PLT 177 165 178   Basic Metabolic Panel: Recent Labs  Lab 12/20/23 1122 12/20/23 1356 12/21/23 0812 12/22/23 0607  12/23/23 0355  NA 140  --  143 146* 141  K 4.7  --  4.3 3.2* 4.1  CL 103  --  100 107 101  CO2 28  --  32 32 31  GLUCOSE 137*  --  115* 91 115*  BUN 19  --  24* 19 22  CREATININE 1.18  --  1.15 0.75 0.80  CALCIUM 8.9  --  8.3* 7.5* 8.4*  MG  --  2.0  --  1.9 2.1  PHOS  --   --   --  2.5 2.4*    Studies: No results found.   Scheduled Meds:  amiodarone  400 mg Oral BID   Followed by   Melene Muller ON 12/29/2023] amiodarone  200 mg Oral BID   Followed by   Melene Muller ON 01/05/2024] amiodarone  200 mg Oral Daily   apixaban  5 mg Oral BID   arformoterol  15 mcg Nebulization BID   vitamin C  500  mg Oral Daily   bisacodyl  10 mg Oral QHS   vitamin B-12  500 mcg Oral Daily   gabapentin  300 mg Oral QHS   iron polysaccharides  150 mg Oral Daily   metoprolol tartrate  25 mg Oral BID   phosphorus  500 mg Oral QID   polyethylene glycol  17 g Oral BID   Vitamin D (Ergocalciferol)  50,000 Units Oral Q7 days   Continuous Infusions:   PRN Meds: bisacodyl, ipratropium-albuterol, sodium chloride  Time spent: 35 minutes  Author: Gillis Santa. MD [facility] 12/23/2023 2:52 PM  To reach On-call, see care teams to locate the attending and reach out to them via [email or web address]. If 7PM-7AM, please contact night-coverage If you still have difficulty reaching the attending provider, please page the [facility] (Director on Call) for [facility] on amion for assistance.

## 2023-12-23 NOTE — TOC Progression Note (Signed)
Transition of Care [facility]) - Progression Note    Patient Details  Name: [facility] Gonzales MRN: [id number] Date of Birth: Oct 24, 1941  Transition [facility]) CM/SW Contact  Bing Quarry, RN Phone Number: 12/23/2023, 3:36 PM  Clinical Narrative:  1/18: Patient from [facility]. Needs another PT evaluation to determine if safe to return to [place] level of care. Provider ordered PT evaluation.    Gabriel Cirri MSN RN CM  RN Case Manager [place]  Transitions of Care Direct Dial: [phone] (Weekends Only) [facility] Main Office Phone: [phone] [facility] Fax: [phone] [email or web address]           Expected Discharge Plan and Services                                               Social Determinants of Health (SDOH) Interventions SDOH Screenings   Food Insecurity: No Food Insecurity (12/22/2023)  Housing: Low Risk  (12/22/2023)  Transportation Needs: No Transportation Needs (12/22/2023)  Utilities: Not At Risk (12/22/2023)  Alcohol Screen: Low Risk  (08/08/2023)  Depression (PHQ2-9): Low Risk  (08/08/2023)  Financial Resource Strain: Low Risk  (08/08/2023)  Physical Activity: Inactive (08/08/2023)  Social Connections: Socially Integrated (12/22/2023)  Stress: No Stress Concern Present (08/08/2023)  Tobacco Use: Low Risk  (12/20/2023)  Health Literacy: Adequate Health Literacy (08/08/2023)    Readmission Risk Interventions    04/16/2023   11:32 AM  Readmission Risk Prevention Plan  Post Dischage Appt Complete  Medication Screening Complete  Transportation Screening Complete

## 2023-12-23 NOTE — Plan of Care (Signed)

## 2023-12-23 NOTE — Evaluation (Signed)
Physical Therapy Evaluation Patient Details Name: Todd Gonzales MRN: [id number] DOB: March 24, 1941 Today's Date: 12/23/2023  History of Present Illness  T[facility] Gonzales is a 83 y.o. male history of CHF, hypertension, hyperlipidemia, atrial fibrillation, presenting with shortness of breath.  States that shortness of breath has been ongoing for a while but got worse today.  States that he has not been taking his furosemide since he takes it on a as needed basis.  States that he is not typically on oxygen.  No new cough or fever.  States that he does have some chest pain yesterday but not today.  No abdominal pain or back pain.  No vomiting or diarrhea.  Per EMS when they arrived, he was satting 86% on room air, he was placed on 4 L nasal cannula with improvement to his oxygen status.  Clinical Impression  83 yo male reports increased shortness of breath and general decline in mobility over last few weeks. He denies any recent falls. He lives at [facility] living with his wife. Patient has history of multiple orthopedic issues including past hip replacement, knee replacement, spinal surgeries; He also has full left frozen shoulder and partial right frozen shoulder. He was walking short distances with quad cane prior to admittance and would use a power scooter for longer distances (>200-300 feet). Patient was doing aquatic exercise but stopped recently because his instructor retired. He expressed interest in joining group exercise at [facility] but doesn't feel like his in enough shape. Patient requires min A for bed mobility. He transfers with RW with min A from elevated surface. He is tall and his height puts him at a disadvantage from regular height surfaces. Patient ambulated in room x25 feet with RW with min A. He was on 1L O2. At rest his Spo2 was 93%, however after walking his Spo2 dropped to 89%. It quickly recovered to low 90's with short seated rest break. Patient would benefit from skilled PT  intervention to improve strength and mobility.         If plan is discharge home, recommend the following: A little help with walking and/or transfers;A little help with bathing/dressing/bathroom;Assistance with cooking/housework;Assist for transportation   Can travel by private vehicle        Equipment Recommendations Rolling walker (2 wheels)  Recommendations for Other Services       Functional Status Assessment Patient has had a recent decline in their functional status and demonstrates the ability to make significant improvements in function in a reasonable and predictable amount of time.     Precautions / Restrictions Precautions Precautions: Fall Precaution Comments: watch 02; Denies any recent falls Restrictions Weight Bearing Restrictions Per Provider Order: No      Mobility  Bed Mobility Overal bed mobility: Needs Assistance Bed Mobility: Supine to Sit     Supine to sit: Min assist     General bed mobility comments: able to scoot legs off bed, but requires hand hold assist to pull trunk forward up in bed for sitting; Reports using bedside table at home to assist with bed mobility    Transfers Overall transfer level: Needs assistance Equipment used: Rolling walker (2 wheels) Transfers: Sit to/from Stand Sit to Stand: Min assist, From elevated surface           General transfer comment: Pt required min Vcs for hand placement for  safety x1 rep    Ambulation/Gait Ambulation/Gait assistance: Contact guard assist Gait Distance (Feet): 25 Feet Assistive device: Rolling  walker (2 wheels) Gait Pattern/deviations: Step-through pattern, Trunk flexed Gait velocity: decreased     General Gait Details: ambulates with normal base of support, slower gait speed, reciprocal pattern with flexed trunk; Pt on 1 L O2, following gait, SpO2 dropped to 89%, quickly rebounded to 92% with sitting rest break;  Stairs            Wheelchair Mobility     Tilt Bed     Modified Rankin (Stroke Patients Only)       Balance Overall balance assessment: Needs assistance Sitting-balance support: Feet supported, Single extremity supported Sitting balance-Leahy Scale: Fair Sitting balance - Comments: able to lean laterally and posteriorly without loss of balance; sits with kyphosis and flexed cervical spine   Standing balance support: Bilateral upper extremity supported, During functional activity, Reliant on assistive device for balance Standing balance-Leahy Scale: Fair                               Pertinent Vitals/Pain Pain Assessment Pain Assessment: No/denies pain    Home Living Family/patient expects to be discharged to:: Other (Comment)                   Additional Comments: resides with spouse at [facility] independent living    Prior Function Prior Level of Function : Independent/Modified Independent             Mobility Comments: indep mobility without use of AD; used quad cane for long distances; has a power scooter that he would use for long distances (200-300 feet or more); still driving; does grocery shopping; ADLs Comments: modified indep with ADLs     Extremity/Trunk Assessment   Upper Extremity Assessment Upper Extremity Assessment: Overall WFL for tasks assessed    Lower Extremity Assessment Lower Extremity Assessment: RLE deficits/detail;LLE deficits/detail RLE Deficits / Details: decreased knee extension ROM (hamstring tightness); strength grossly 4/5 LLE Deficits / Details: decreased knee extension ROM (hamstring tightness); strength grossly 4/5    Cervical / Trunk Assessment Cervical / Trunk Assessment: Kyphotic  Communication   Communication Communication: No apparent difficulties  Cognition Arousal: Alert Behavior During Therapy: WFL for tasks assessed/performed Overall Cognitive Status: Within Functional Limits for tasks assessed                                           General Comments General comments (skin integrity, edema, etc.): no swelling noted in BLE; SpO2 levels monitored; when on 1L O2 levels are around 92-93%; however after walking Spo2 dropped to 89% even with 1L support    Exercises Other Exercises Other Exercises: Educated patient in role of PT; Discussed assistive devices- patient was using a quad cane prior to admittance; Recommend RW at this time for energy conservation and to improve stability;   Assessment/Plan    PT Assessment Patient needs continued PT services  PT Problem List Decreased strength;Cardiopulmonary status limiting activity;Decreased range of motion;Decreased activity tolerance;Decreased balance;Decreased safety awareness;Decreased mobility       PT Treatment Interventions DME instruction;Balance training;Gait training;Functional mobility training;Patient/family education;Therapeutic activities;Therapeutic exercise    PT Goals (Current goals can be found in the Care Plan section)  Acute Rehab PT Goals Patient Stated Goal: to go back to [place] and get stronger PT Goal Formulation: With patient Time For Goal Achievement: 01/06/24 Potential to Achieve Goals: Good  Frequency Min 1X/week     Co-evaluation               AM-PAC PT "6 Clicks" Mobility  Outcome Measure Help needed turning from your back to your side while in a flat bed without using bedrails?: None Help needed moving from lying on your back to sitting on the side of a flat bed without using bedrails?: A Little Help needed moving to and from a bed to a chair (including a wheelchair)?: A Little Help needed standing up from a chair using your arms (e.g., wheelchair or bedside chair)?: A Little Help needed to walk in hospital room?: A Little Help needed climbing 3-5 steps with a railing? : A Lot 6 Click Score: 18    End of Session Equipment Utilized During Treatment: Gait belt Activity Tolerance: Patient tolerated treatment  well Patient left: in bed;with family/visitor present;with nursing/sitter in room Nurse Communication: Mobility status PT Visit Diagnosis: Other abnormalities of gait and mobility (R26.89);Muscle weakness (generalized) (M62.81);Difficulty in walking, not elsewhere classified (R26.2)    Time: 1425-1450 PT Time Calculation (min) (ACUTE ONLY): 25 min   Charges:   PT Evaluation $PT Eval Low Complexity: 1 Low   PT General Charges $$ ACUTE PT VISIT: 1 Visit          Teal Bontrager PT, DPT 12/23/2023, 4:47 PM

## 2023-12-23 NOTE — Progress Notes (Signed)
Progress Note  Patient Name: [facility] Gonzales Date of Encounter: 12/23/2023  Primary Cardiologist: Herbie Baltimore  Subjective   He underwent successful TEE guided DCCV on 12/22/2023, maintaining sinus rhythm.  Reports his breathing is improved and back to baseline.  No chest pain, palpitations, dizziness, recent to be, or syncope.  Inpatient Medications    Scheduled Meds:  amiodarone  400 mg Oral BID   Followed by   Melene Muller ON 12/29/2023] amiodarone  200 mg Oral BID   Followed by   Melene Muller ON 01/05/2024] amiodarone  200 mg Oral Daily   apixaban  5 mg Oral BID   arformoterol  15 mcg Nebulization BID   vitamin C  500 mg Oral Daily   iron polysaccharides  150 mg Oral Daily   Continuous Infusions:   PRN Meds: ipratropium-albuterol, sodium chloride   Vital Signs    Vitals:   12/22/23 2031 12/22/23 2047 12/23/23 0047 12/23/23 0459  BP:  135/79 135/81 136/73  Pulse:  79 76 72  Resp:  20 20 19   Temp:  98.9 F (37.2 C) 98 F (36.7 C) 98.6 F (37 C)  TempSrc:    Oral  SpO2: 95% (!) 87% 92% (!) 87%  Weight:      Height:        Intake/Output Summary (Last 24 hours) at 12/23/2023 0723 Last data filed at 12/23/2023 0314 Gross per 24 hour  Intake 141.19 ml  Output 400 ml  Net -258.81 ml   Filed Weights   12/20/23 1110  Weight: 108.9 kg    Telemetry    Sinus rhythm with rare PVCs - Personally Reviewed  ECG    Sinus rhythm, 87 bpm, no acute ST-T changes - Personally Reviewed  Physical Exam   GEN: No acute distress.   Neck: No JVD. Cardiac: RRR, no murmurs, rubs, or gallops.  Respiratory: Clear to auscultation bilaterally.  GI: Soft, nontender, non-distended.   MS: No edema; No deformity. Neuro:  Alert and oriented x 3; Nonfocal.  Psych: Normal affect.  Labs    Chemistry Recent Labs  Lab 12/20/23 1122 12/21/23 0812 12/22/23 0607 12/23/23 0355  NA 140 143 146* 141  K 4.7 4.3 3.2* 4.1  CL 103 100 107 101  CO2 28 32 32 31  GLUCOSE 137* 115* 91 115*  BUN 19 24*  19 22  CREATININE 1.18 1.15 0.75 0.80  CALCIUM 8.9 8.3* 7.5* 8.4*  PROT 6.6  --   --   --   ALBUMIN 3.7  --   --   --   AST 21  --   --   --   ALT 30  --   --   --   ALKPHOS 47  --   --   --   BILITOT 1.6*  --   --   --   GFRNONAA >60 >60 >60 >60  ANIONGAP 9 11 7 9      Hematology Recent Labs  Lab 12/20/23 1122 12/22/23 0607 12/23/23 0355  WBC 9.4 8.4 7.3  RBC 4.40 3.93* 4.20*  HGB 14.0 12.7* 13.5  HCT 44.8 40.5 41.2  MCV 101.8* 103.1* 98.1  MCH 31.8 32.3 32.1  MCHC 31.3 31.4 32.8  RDW 13.8 13.3 13.3  PLT 177 165 178    Cardiac EnzymesNo results for input(s): "TROPONINI" in the last 168 hours. No results for input(s): "TROPIPOC" in the last 168 hours.   BNP Recent Labs  Lab 12/20/23 1122  BNP 625.5*     DDimer  Recent Labs  Lab 12/20/23 1357  DDIMER 0.31     Radiology    DG Chest 2 View Result Date: 12/20/2023 IMPRESSION: Hypoinflation of the lungs with minimal bibasilar subsegmental atelectasis. Electronically Signed   By: Lupita Raider M.D.   On: 12/20/2023 13:55    Cardiac Studies   2D echo 12/21/2023: 1. Left ventricular ejection fraction, by estimation, is 60 to 65%. The  left ventricle has normal function. The left ventricle has no regional  wall motion abnormalities. There is mild left ventricular hypertrophy.  Left ventricular diastolic parameters  are indeterminate.   2. Right ventricular systolic function is normal. The right ventricular  size is normal. There is normal pulmonary artery systolic pressure. The  estimated right ventricular systolic pressure is 29.9 mmHg.   3. The mitral valve is normal in structure. No evidence of mitral valve  regurgitation. No evidence of mitral stenosis. Moderate mitral annular  calcification.   4. The aortic valve is normal in structure. Aortic valve regurgitation is  not visualized. No aortic stenosis is present.   5. The inferior vena cava is normal in size with greater than 50%  respiratory  variability, suggesting right atrial pressure of 3 mmHg.   Patient Profile     83 y.o. male with history of nonobstructive CAD, PAF, HFpEF, HTN, and HLD who is being evaluated for A-fib with RVR and HFpEF.  Assessment & Plan    1.  A-fib with RVR: -Prior to DCCV, ventricular rates were difficult to control with relative hypotension limiting AV nodal blocker usage -He underwent successful TEE guided DCCV on 12/22/2023 and remains in sinus rhythm -Has been started on amiodarone 400 mg twice daily for 7 days followed by 200 mg twice daily for 7 days followed by 200 mg daily thereafter -Continue apixaban 5 mg twice daily -Resume metoprolol as BP allows  2.  Nonobstructive CAD with elevated troponin: -No symptoms suggestive of angina -Mildly elevated high-sensitivity troponin in the setting of supply/demand ischemia with A-fib with RVR -Recent coronary CTA showed moderate nonocclusive CAD -Continue apixaban complains of aspirin as outlined above -Echo with preserved LV systolic function this admission -No plans for inpatient ischemic evaluation at this time  3.  Acute on chronic HFpEF: -Likely in the setting of A-fib with RVR -Volume status improved -Escalate GDMT as BP allows  4.  HTN: -Blood pressure has been soft, though stable -PTA metoprolol held in the setting of hypotension  5.  Hypokalemia: -Repleted        For questions or updates, please contact [facility] Please consult [email or web address] for contact info under Cardiology/STEMI.    Signed, Eula Listen, PA-C [facility] Pager: [phone] 12/23/2023, 7:23 AM

## 2023-12-23 NOTE — Evaluation (Signed)
Occupational Therapy Evaluation Patient Details Name: Todd Gonzales MRN: [id number] DOB: 02-22-1941 Today's Date: 12/23/2023   History of Present Illness Todd Gonzales is a 83 y.o. male history of CHF, hypertension, hyperlipidemia, atrial fibrillation, presenting with shortness of breath.  States that shortness of breath has been ongoing for a while but got worse today.  States that he has not been taking his furosemide since he takes it on a as needed basis.  States that he is not typically on oxygen.  No new cough or fever.  States that he does have some chest pain yesterday but not today.  No abdominal pain or back pain.  No vomiting or diarrhea.  Per EMS when they arrived, he was satting 86% on room air, he was placed on 4 L nasal cannula with improvement to his oxygen status.   Clinical Impression   Pt in bed upon OT arrival with daughter present for duration of session.  Cardioverted yesterday; RN confirmed ok to participate in OT eval with activity as tolerated.  Pt agreeable to OT eval, though admits fatigue and feeling like he won't be able to do much today.  Pt utilized urinal while seated EOB, performing hand hygiene with set up.  CGA-min A for sit to stand from elevated surface using RW.  Tolerated ambulation with RW from bedside to curtain and back to bedside with min guard and cues for PLB, ~10 ft total.  Pt breathing with shallow, quick breaths, and cued for PLB technique.  Pt on room air upon OT arrival but notified RN of pt 02 sats mid-high 80s despite PLB at rest and with activity.  Daughter also confirmed VS recently checked and pt was resting in the mid-high 80s on room air.  RN donned nasal cannula and set to 2L at end of session, with OT providing vc to limit "mouth breathing" to improve 02 sats with nasal cannula.  Pt receptive to therapy upon d/c at [facility] d/t recent functional decline; Recommend HH vs SNF at [facility], depending on pt's progression in acute.  Will continue to  follow to maximize indep with ADLs and functional mobility to work towards return to PLOF.        If plan is discharge home, recommend the following: A little help with walking and/or transfers;Assistance with cooking/housework;Assist for transportation;A lot of help with bathing/dressing/bathroom    Functional Status Assessment  Patient has had a recent decline in their functional status and demonstrates the ability to make significant improvements in function in a reasonable and predictable amount of time.  Equipment Recommendations  None recommended by OT    Recommendations for Other Services       Precautions / Restrictions Precautions Precautions: Fall Precaution Comments: watch 02 Restrictions Weight Bearing Restrictions Per Provider Order: No      Mobility Bed Mobility Overal bed mobility: Needs Assistance Bed Mobility: Rolling, Supine to Sit, Sit to Supine Rolling: Modified independent (Device/Increase time)   Supine to sit: Min assist Sit to supine: Mod assist   General bed mobility comments: able to sit and scoot toward [facility] with good clearance of buttocks Patient Response: Cooperative  Transfers Overall transfer level: Needs assistance Equipment used: Rolling walker (2 wheels) Transfers: Sit to/from Stand Sit to Stand: Min assist, From elevated surface                  Balance Overall balance assessment: Needs assistance Sitting-balance support: Feet supported, Single extremity supported Sitting balance-Leahy Scale: Fair Sitting  balance - Comments: able to lean laterally and posteriorly for use of urinal at bedside with supv   Standing balance support: Bilateral upper extremity supported, During functional activity, Reliant on assistive device for balance Standing balance-Leahy Scale: Fair                             ADL either performed or assessed with clinical judgement   ADL Overall ADL's : Needs assistance/impaired                              Toileting- Clothing Manipulation and Hygiene: Supervision/safety;Set up;Sitting/lateral lean Toileting - Clothing Manipulation Details (indicate cue type and reason): used urinal while seated EOB     Functional mobility during ADLs: Contact guard assist;Rolling walker (2 wheels) General ADL Comments: Utilized RW with min guard to amb from bedside to curtain and back to bedside.  Dyspneic; vc for PLB.     Vision Patient Visual Report: No change from baseline                         Pertinent Vitals/Pain Pain Assessment Pain Assessment: Faces Pain Score: 1  Pain Location: neck/positional Pain Descriptors / Indicators: Aching, Cramping Pain Intervention(s): Limited activity within patient's tolerance, Monitored during session, Repositioned     Extremity/Trunk Assessment Upper Extremity Assessment Upper Extremity Assessment: Overall WFL for tasks assessed   Lower Extremity Assessment Lower Extremity Assessment: Generalized weakness       Communication Communication Communication: No apparent difficulties   Cognition Arousal: Alert Behavior During Therapy: WFL for tasks assessed/performed Overall Cognitive Status: Within Functional Limits for tasks assessed                                       General Comments       Exercises Other Exercises Other Exercises: OT role, goals, poc   Shoulder Instructions      Home Living Family/patient expects to be discharged to:: Other (Comment)                                 Additional Comments: resides with spouse at [facility] independent living      Prior Functioning/Environment Prior Level of Function : Independent/Modified Independent             Mobility Comments: indep mobility without use of AD ADLs Comments: modified indep with ADLs        OT Problem List: Decreased strength;Decreased activity tolerance;Cardiopulmonary status limiting  activity;Impaired balance (sitting and/or standing)      OT Treatment/Interventions: Self-care/ADL training;Therapeutic exercise;Patient/family education;Balance training;Energy conservation;Therapeutic activities;DME and/or AE instruction    OT Goals(Current goals can be found in the care plan section) Acute Rehab OT Goals Patient Stated Goal: Return home with therapy at [facility] OT Goal Formulation: With patient/family Time For Goal Achievement: 01/06/24 Potential to Achieve Goals: Good ADL Goals Pt Will Perform Grooming: standing;with supervision Pt Will Transfer to Toilet: with supervision;ambulating;grab bars;bedside commode Pt Will Perform Toileting - Clothing Manipulation and hygiene: with supervision;sit to/from stand  OT Frequency: Min 1X/week                  AM-PAC OT "6 Clicks" Daily Activity     Outcome  Measure Help from another person eating meals?: None Help from another person taking care of personal grooming?: A Little Help from another person toileting, which includes using toliet, bedpan, or urinal?: A Little Help from another person bathing (including washing, rinsing, drying)?: A Lot Help from another person to put on and taking off regular upper body clothing?: A Little Help from another person to put on and taking off regular lower body clothing?: A Lot 6 Click Score: 17   End of Session Equipment Utilized During Treatment: Gait belt;Rolling walker (2 wheels) Nurse Communication: Mobility status (02 desaturation)  Activity Tolerance: Patient limited by lethargy;Other (comment) (dyspneic) Patient left: in bed;with call bell/phone within reach;with family/visitor present  OT Visit Diagnosis: Unsteadiness on feet (R26.81);Muscle weakness (generalized) (M62.81)                Time: 4259-5638 OT Time Calculation (min): 21 min Charges:  OT General Charges $OT Visit: 1 Visit OT Evaluation $OT Eval Moderate Complexity: 1 Mod  Danelle Earthly, MS,  OTR/L   Otis Dials 12/23/2023, 1:09 PM

## 2023-12-24 DIAGNOSIS — J9601 Acute respiratory failure with hypoxia: Secondary | ICD-10-CM | POA: Diagnosis not present

## 2023-12-24 LAB — PHOSPHORUS: Phosphorus: 3.3 mg/dL (ref 2.5–4.6)

## 2023-12-24 LAB — CBC
HCT: 40.3 % (ref 39.0–52.0)
Hemoglobin: 13 g/dL (ref 13.0–17.0)
MCH: 32 pg (ref 26.0–34.0)
MCHC: 32.3 g/dL (ref 30.0–36.0)
MCV: 99.3 fL (ref 80.0–100.0)
Platelets: 181 10*3/uL (ref 150–400)
RBC: 4.06 MIL/uL — ABNORMAL LOW (ref 4.22–5.81)
RDW: 13.4 % (ref 11.5–15.5)
WBC: 6.9 10*3/uL (ref 4.0–10.5)
nRBC: 0 % (ref 0.0–0.2)

## 2023-12-24 LAB — BASIC METABOLIC PANEL
Anion gap: 13 (ref 5–15)
BUN: 21 mg/dL (ref 8–23)
CO2: 33 mmol/L — ABNORMAL HIGH (ref 22–32)
Calcium: 8.2 mg/dL — ABNORMAL LOW (ref 8.9–10.3)
Chloride: 96 mmol/L — ABNORMAL LOW (ref 98–111)
Creatinine, Ser: 0.83 mg/dL (ref 0.61–1.24)
GFR, Estimated: >60 mL/min (ref >60)
Glucose, Bld: 105 mg/dL — ABNORMAL HIGH (ref 70–99)
Potassium: 3.4 mmol/L — ABNORMAL LOW (ref 3.5–5.1)
Sodium: 142 mmol/L (ref 135–145)

## 2023-12-24 LAB — MAGNESIUM: Magnesium: 2.1 mg/dL (ref 1.7–2.4)

## 2023-12-24 MED ORDER — POTASSIUM CHLORIDE CRYS ER 20 MEQ PO TBCR
40.0000 meq | EXTENDED_RELEASE_TABLET | Freq: Once | ORAL | Status: AC
  Administered 2023-12-24: 40 meq via ORAL
  Filled 2023-12-24: qty 2

## 2023-12-24 MED ORDER — HYDROCORTISONE (PERIANAL) 2.5 % EX CREA
1.0000 | TOPICAL_CREAM | Freq: Two times a day (BID) | CUTANEOUS | Status: DC
  Administered 2023-12-24 – 2024-01-05 (×20): 1 via RECTAL
  Filled 2023-12-24: qty 28.35

## 2023-12-24 NOTE — Progress Notes (Signed)
[facility] Progress Note  Patient: Todd Gonzales    EXB:[id number]  DOA: 12/20/2023     Date of Service: the patient was seen and examined on 12/24/2023  Chief Complaint  Patient presents with   Shortness of Breath   Brief hospital course: Todd Gonzales is a 83 y.o. male with medical history significant of morbid obesity, HFpEF, CAD, atrial fibrillation presenting with acute respiratory failure with hypoxia and hypercarbia, acute on chronic HFpEF.  Patient reports increased work of breathing over the past week or so.  Noted baseline HFpEF.  Reports intermittent compliance with Lasix.  Mild orthopnea PND as well as lower extremity swelling.  No chest pain.  No nausea or vomiting.  No focal hemiparesis or confusion.  Shortness of breath has become more pronounced.  Currently lives at assisted living facility.  Was redirected to the ER for worsening shortness of breath.  Noted to be satting at 82% on room air upon EMS arrival. Presented to ER afebrile, HR 140s-50s, BP stable. Initially in 80s on RA. Placed on BIPAP. VBG w/ hypercarbia. BNP 600s. Cr 1.2. T bili 1.6.    Assessment and Plan:  # Acute respiratory failure with hypoxia and hypercapnia, Resolved  Decompensated resp failure requiring BIPAP in setting of acute on chronic HFpEF  Noted decompensated resp acidosis on presentation  No noted prior hx/o tobacco abuse or obstructive lung disease  Suspect element of OHS/sleep apnea-noted large body habitus  D dimer  0.31 negative Cont w/ BIPAP prn Monitor Started Brovana nebulizer twice a day Started DuoNeb every 6 hourly as needed Patient needs sleep study and pulmonary follow-up as an outpatient for PFTs  # Acute on chronic heart failure with preserved ejection fraction (HFpEF) 2D ECHO 04/2023 w/ EF 60-65%  + hypercarbic resp failure on presentation w/ volume overload  Noted baseline interimittent use of lasix at facility  BNP 625- well above baseline, LE edema resolved   CXR w/  cardiomegaly and vascular congestion preliminarily  S/p IV lasix  Cardiology consulted   # Atrial fibrillation with rapid ventricular response, Resolved s/p cardioversion done on 1/17 Initial HR into 150s, Improved to 50s s/p IV metoprolol x 1  continue eliquis  S/p digoxin 0.5 mg p.o. daily and S/p  Cardizem IV infusion Monitor HR w/ BB use  Cardiology consulted, sp TEE and cardioversion 1/17 Started amiodarone 400 mg p.o. twice daily for 7 days followed by 200 mg p.o. twice daily for 7 days followed by 20 mg p.o. daily as per cardiology 1/18 started metoprolol 25 mg p.o. twice daily as per cardio  # Hypokalemia, potassium repleted.  # Coronary artery disease, non-occlusive Baseline CAD  No active chest pain  Monitor    # HTN (hypertension) Monitor BP and titrate home regimen  Patient became hypotensive, received digoxin and Cardizem IV infusion as above 1/18 started metoprolol 25 mg p.o. twice daily as per cardio  # Iron-deficiency, transfer 78%, started oral iron supplement. # Vitamin D level 31.7, at lower end, started vitamin D 55 units p.o. weekly. # Vitamin B12 level 378, goal <400, started B12 oral supplement.   Obesity class I Body mass index is 34.44 kg/m.  Interventions:  Diet: Heart healthy diet, fluid restriction 1.5 L/day DVT Prophylaxis: Therapeutic Anticoagulation with Eliquis    Advance goals of care discussion: Full code  Family Communication: family was not present at bedside, at the time of interview.  The pt provided permission to discuss medical plan with the family. Opportunity was  given to ask question and all questions were answered satisfactorily.   Disposition:  Pt is from ILF (independent living [facility] ), admitted with Resp failure and A.fib, s/p cardioversion done on 1/17, Continue to monitor on telemetry. Discharge plan to ILF vs SNF TBD after PT/OT eval. Stable to process discharge when bed will be available.   Subjective: No  significant events overnight, patient did sleep good last night after taking his home medication gabapentin for sleep.  Patient did move bowels, denies any worsening of shortness of breath, saturating well on room air.  Physical Exam: General: NAD, lying comfortably Appear in no distress, affect appropriate Eyes: PERRLA ENT: Oral Mucosa Clear, moist  Neck: no JVD,  Cardiovascular: S1 and S2 Present, no Murmur, Respiratory: good respiratory effort, Bilateral Air entry equal and Decreased, no Crackles, no wheezes Abdomen: Bowel Sound present, Soft and no tenderness, obese Skin: no rashes Extremities: no Pedal edema, no calf tenderness Neurologic: without any new focal findings Gait not checked due to patient safety concerns  Vitals:   12/23/23 2321 12/24/23 0518 12/24/23 0857 12/24/23 1256  BP: 137/66 138/63 (!) 152/62 118/62  Pulse: (!) 58 (!) 58 62 (!) 56  Resp: 14 17 16 18   Temp: 98.4 F (36.9 C) 97.9 F (36.6 C) 98.6 F (37 C) 98.5 F (36.9 C)  TempSrc:  Oral    SpO2: 91% 91% 92% 93%  Weight:      Height:        Intake/Output Summary (Last 24 hours) at 12/24/2023 1425 Last data filed at 12/24/2023 0500 Gross per 24 hour  Intake 100 ml  Output 1450 ml  Net -1350 ml   Filed Weights   12/20/23 1110  Weight: 108.9 kg    Data Reviewed: I have personally reviewed and interpreted daily labs, tele strips, imagings as discussed above. I reviewed all nursing notes, pharmacy notes, vitals, pertinent old records I have discussed plan of care as described above with RN and patient/family.  CBC: Recent Labs  Lab 12/20/23 1122 12/22/23 0607 12/23/23 0355 12/24/23 0307  WBC 9.4 8.4 7.3 6.9  NEUTROABS 7.9*  --   --   --   HGB 14.0 12.7* 13.5 13.0  HCT 44.8 40.5 41.2 40.3  MCV 101.8* 103.1* 98.1 99.3  PLT 177 165 178 181   Basic Metabolic Panel: Recent Labs  Lab 12/20/23 1122 12/20/23 1356 12/21/23 0812 12/22/23 0607 12/23/23 0355 12/24/23 0307  NA 140  --  143  146* 141 142  K 4.7  --  4.3 3.2* 4.1 3.4*  CL 103  --  100 107 101 96*  CO2 28  --  32 32 31 33*  GLUCOSE 137*  --  115* 91 115* 105*  BUN 19  --  24* 19 22 21   CREATININE 1.18  --  1.15 0.75 0.80 0.83  CALCIUM 8.9  --  8.3* 7.5* 8.4* 8.2*  MG  --  2.0  --  1.9 2.1 2.1  PHOS  --   --   --  2.5 2.4* 3.3    Studies: No results found.   Scheduled Meds:  amiodarone  400 mg Oral BID   Followed by   Melene Muller ON 12/29/2023] amiodarone  200 mg Oral BID   Followed by   Melene Muller ON 01/05/2024] amiodarone  200 mg Oral Daily   apixaban  5 mg Oral BID   arformoterol  15 mcg Nebulization BID   vitamin C  500 mg Oral Daily   bisacodyl  10 mg Oral QHS   vitamin B-12  500 mcg Oral Daily   gabapentin  300 mg Oral QHS   hydrocortisone  1 Application Rectal BID   iron polysaccharides  150 mg Oral Daily   metoprolol tartrate  25 mg Oral BID   polyethylene glycol  17 g Oral BID   Vitamin D (Ergocalciferol)  50,000 Units Oral Q7 days   Continuous Infusions:   PRN Meds: bisacodyl, ipratropium-albuterol, sodium chloride  Time spent: 35 minutes  Author: Gillis Santa. MD [facility] 12/24/2023 2:25 PM  To reach On-call, see care teams to locate the attending and reach out to them via [email or web address]. If 7PM-7AM, please contact night-coverage If you still have difficulty reaching the attending provider, please page the [facility] (Director on Call) for [facility] on amion for assistance.

## 2023-12-24 NOTE — Plan of Care (Signed)
  Problem: Clinical Measurements: Goal: Ability to maintain clinical measurements within normal limits will improve Outcome: Progressing Goal: Will remain free from infection Outcome: Progressing Goal: Respiratory complications will improve Outcome: Progressing Goal: Cardiovascular complication will be avoided Outcome: Progressing   Problem: Elimination: Goal: Will not experience complications related to bowel motility Outcome: Progressing

## 2023-12-24 NOTE — Plan of Care (Signed)
  Problem: Clinical Measurements: Goal: Ability to maintain clinical measurements within normal limits will improve Outcome: Progressing   Problem: Activity: Goal: Risk for activity intolerance will decrease Outcome: Progressing   Problem: Safety: Goal: Ability to remain free from injury will improve Outcome: Progressing   Problem: Pain Managment: Goal: General experience of comfort will improve and/or be controlled Outcome: Progressing

## 2023-12-25 ENCOUNTER — Encounter: Payer: Self-pay | Admitting: Cardiology

## 2023-12-25 DIAGNOSIS — J9601 Acute respiratory failure with hypoxia: Secondary | ICD-10-CM | POA: Diagnosis not present

## 2023-12-25 LAB — BASIC METABOLIC PANEL
Anion gap: 9 (ref 5–15)
BUN: 20 mg/dL (ref 8–23)
CO2: 34 mmol/L — ABNORMAL HIGH (ref 22–32)
Calcium: 8.3 mg/dL — ABNORMAL LOW (ref 8.9–10.3)
Chloride: 100 mmol/L (ref 98–111)
Creatinine, Ser: 0.79 mg/dL (ref 0.61–1.24)
GFR, Estimated: >60 mL/min (ref >60)
Glucose, Bld: 105 mg/dL — ABNORMAL HIGH (ref 70–99)
Potassium: 3.3 mmol/L — ABNORMAL LOW (ref 3.5–5.1)
Sodium: 143 mmol/L (ref 135–145)

## 2023-12-25 LAB — CBC
HCT: 41.3 % (ref 39.0–52.0)
Hemoglobin: 13.2 g/dL (ref 13.0–17.0)
MCH: 31.7 pg (ref 26.0–34.0)
MCHC: 32 g/dL (ref 30.0–36.0)
MCV: 99.3 fL (ref 80.0–100.0)
Platelets: 199 10*3/uL (ref 150–400)
RBC: 4.16 MIL/uL — ABNORMAL LOW (ref 4.22–5.81)
RDW: 13.7 % (ref 11.5–15.5)
WBC: 7.1 10*3/uL (ref 4.0–10.5)
nRBC: 0 % (ref 0.0–0.2)

## 2023-12-25 MED ORDER — POTASSIUM CHLORIDE CRYS ER 20 MEQ PO TBCR
40.0000 meq | EXTENDED_RELEASE_TABLET | Freq: Once | ORAL | Status: AC
  Administered 2023-12-25: 40 meq via ORAL
  Filled 2023-12-25: qty 2

## 2023-12-25 NOTE — Progress Notes (Signed)
[facility] Progress Note  Patient: Todd Gonzales    ZOX:[id number]  DOA: 12/20/2023     Date of Service: the patient was seen and examined on 12/25/2023  Chief Complaint  Patient presents with   Shortness of Breath   Brief hospital course: Todd Gonzales is a 83 y.o. male with medical history significant of morbid obesity, HFpEF, CAD, atrial fibrillation presenting with acute respiratory failure with hypoxia and hypercarbia, acute on chronic HFpEF.  Patient reports increased work of breathing over the past week or so.  Noted baseline HFpEF.  Reports intermittent compliance with Lasix.  Mild orthopnea PND as well as lower extremity swelling.  No chest pain.  No nausea or vomiting.  No focal hemiparesis or confusion.  Shortness of breath has become more pronounced.  Currently lives at assisted living facility.  Was redirected to the ER for worsening shortness of breath.  Noted to be satting at 82% on room air upon EMS arrival. Presented to ER afebrile, HR 140s-50s, BP stable. Initially in 80s on RA. Placed on BIPAP. VBG w/ hypercarbia. BNP 600s. Cr 1.2. T bili 1.6.    Assessment and Plan:  # Acute respiratory failure with hypoxia and hypercapnia, Resolved  Decompensated resp failure requiring BIPAP in setting of acute on chronic HFpEF  Noted decompensated resp acidosis on presentation  No noted prior hx/o tobacco abuse or obstructive lung disease  Suspect element of OHS/sleep apnea-noted large body habitus  D dimer  0.31 negative,Cont w/ BIPAP prn S/p Brovana nebulizer BID, d/c'd on 1/20, no more need Continue DuoNeb every 6 hourly as needed Patient needs sleep study and pulmonary follow-up as an outpatient for PFTs  # Acute on chronic heart failure with preserved ejection fraction (HFpEF) 2D ECHO 04/2023 w/ EF 60-65%  hypercarbic resp failure on presentation w/ volume overload  Noted baseline interimittent use of lasix at facility  BNP 625- well above baseline, LE edema resolved    CXR w/ cardiomegaly and vascular congestion preliminarily  S/p IV lasix  Cardiology consulted   # Atrial fibrillation with rapid ventricular response, Resolved s/p cardioversion done on 1/17 Initial HR into 150s, Improved to 50s s/p IV metoprolol x 1  continue eliquis  S/p digoxin 0.5 mg p.o. daily and S/p  Cardizem IV infusion Monitor HR w/ BB use  Cardiology consulted, sp TEE and cardioversion 1/17 Started amiodarone 400 mg p.o. twice daily for 7 days followed by 200 mg p.o. twice daily for 7 days followed by 20 mg p.o. daily as per cardiology 1/18 started metoprolol 25 mg p.o. twice daily as per cardio  # Hypokalemia, potassium repleted.  # Coronary artery disease, non-occlusive Baseline CAD  No active chest pain  Monitor    # HTN (hypertension) Monitor BP and titrate home regimen  Patient became hypotensive, received digoxin and Cardizem IV infusion as above 1/18 started metoprolol 25 mg p.o. twice daily as per cardio  # Iron-deficiency, transfer sat 8%, started oral iron supplement. # Vitamin D level 31.7, at lower end, started vitamin D 55 units p.o. weekly. # Vitamin B12 level 378, goal <400, started B12 oral supplement.   Obesity class I Body mass index is 34.44 kg/m.  Interventions:  Diet: Heart healthy diet, fluid restriction 1.5 L/day DVT Prophylaxis: Therapeutic Anticoagulation with Eliquis    Advance goals of care discussion: Full code  Family Communication: family was not present at bedside, at the time of interview.  The pt provided permission to discuss medical plan with the family.  Opportunity was given to ask question and all questions were answered satisfactorily.   Disposition:  Pt is from ILF (independent living [facility] ), admitted with Resp failure and A.fib, s/p cardioversion done on 1/17.stable to discharge  Discharge plan to SNF as per PT eval. Stable to discharge when bed will be available. Follow TOC for disposition plan   Subjective:  No significant events overnight, patient denies any chest pain or palpitations, no worsening of shortness of breath.  Resting comfortably.   Physical Exam: General: NAD, lying comfortably Appear in no distress, affect appropriate Eyes: PERRLA ENT: Oral Mucosa Clear, moist  Neck: no JVD,  Cardiovascular: S1 and S2 Present, no Murmur, Respiratory: good respiratory effort, Bilateral Air entry equal and Decreased, no Crackles, no wheezes Abdomen: Bowel Sound present, Soft and no tenderness, obese Skin: no rashes Extremities: no Pedal edema, no calf tenderness Neurologic: without any new focal findings Gait not checked due to patient safety concerns  Vitals:   12/24/23 2200 12/24/23 2326 12/25/23 0811 12/25/23 1302  BP: (!) 151/74 128/63 (!) 136/56 137/65  Pulse: 62 64 (!) 55 (!) 55  Resp:  20 14 15   Temp:  98.3 F (36.8 C) 98.2 F (36.8 C) 98.6 F (37 C)  TempSrc:      SpO2:  94% 94% 93%  Weight:      Height:        Intake/Output Summary (Last 24 hours) at 12/25/2023 1723 Last data filed at 12/25/2023 1914 Gross per 24 hour  Intake 240 ml  Output 800 ml  Net -560 ml   Filed Weights   12/20/23 1110  Weight: 108.9 kg    Data Reviewed: I have personally reviewed and interpreted daily labs, tele strips, imagings as discussed above. I reviewed all nursing notes, pharmacy notes, vitals, pertinent old records I have discussed plan of care as described above with RN and patient/family.  CBC: Recent Labs  Lab 12/20/23 1122 12/22/23 0607 12/23/23 0355 12/24/23 0307 12/25/23 0631  WBC 9.4 8.4 7.3 6.9 7.1  NEUTROABS 7.9*  --   --   --   --   HGB 14.0 12.7* 13.5 13.0 13.2  HCT 44.8 40.5 41.2 40.3 41.3  MCV 101.8* 103.1* 98.1 99.3 99.3  PLT 177 165 178 181 199   Basic Metabolic Panel: Recent Labs  Lab 12/20/23 1356 12/21/23 0812 12/22/23 0607 12/23/23 0355 12/24/23 0307 12/25/23 0631  NA  --  143 146* 141 142 143  K  --  4.3 3.2* 4.1 3.4* 3.3*  CL  --  100 107  101 96* 100  CO2  --  32 32 31 33* 34*  GLUCOSE  --  115* 91 115* 105* 105*  BUN  --  24* 19 22 21 20   CREATININE  --  1.15 0.75 0.80 0.83 0.79  CALCIUM  --  8.3* 7.5* 8.4* 8.2* 8.3*  MG 2.0  --  1.9 2.1 2.1  --   PHOS  --   --  2.5 2.4* 3.3  --     Studies: No results found.   Scheduled Meds:  amiodarone  400 mg Oral BID   Followed by   Melene Muller ON 12/29/2023] amiodarone  200 mg Oral BID   Followed by   Melene Muller ON 01/05/2024] amiodarone  200 mg Oral Daily   apixaban  5 mg Oral BID   vitamin C  500 mg Oral Daily   bisacodyl  10 mg Oral QHS   vitamin B-12  500 mcg Oral  Daily   gabapentin  300 mg Oral QHS   hydrocortisone  1 Application Rectal BID   iron polysaccharides  150 mg Oral Daily   metoprolol tartrate  25 mg Oral BID   polyethylene glycol  17 g Oral BID   Vitamin D (Ergocalciferol)  50,000 Units Oral Q7 days   Continuous Infusions:   PRN Meds: bisacodyl, ipratropium-albuterol, sodium chloride  Time spent: 35 minutes  Author: Gillis Santa. MD [facility] 12/25/2023 5:23 PM  To reach On-call, see care teams to locate the attending and reach out to them via [email or web address]. If 7PM-7AM, please contact night-coverage If you still have difficulty reaching the attending provider, please page the [facility] (Director on Call) for [facility] on amion for assistance.

## 2023-12-25 NOTE — Plan of Care (Signed)

## 2023-12-25 NOTE — Care Management Important Message (Signed)
Important Message  Patient Details  Name: Todd Gonzales MRN: [id number] Date of Birth: 05-15-1941   Important Message Given:  Yes - Medicare IM     Sherilyn Banker 12/25/2023, 11:25 AM

## 2023-12-25 NOTE — Plan of Care (Signed)

## 2023-12-26 DIAGNOSIS — J9601 Acute respiratory failure with hypoxia: Secondary | ICD-10-CM | POA: Diagnosis not present

## 2023-12-26 LAB — CBC
HCT: 39.9 % (ref 39.0–52.0)
Hemoglobin: 12.9 g/dL — ABNORMAL LOW (ref 13.0–17.0)
MCH: 32.3 pg (ref 26.0–34.0)
MCHC: 32.3 g/dL (ref 30.0–36.0)
MCV: 99.8 fL (ref 80.0–100.0)
Platelets: 201 10*3/uL (ref 150–400)
RBC: 4 MIL/uL — ABNORMAL LOW (ref 4.22–5.81)
RDW: 13.5 % (ref 11.5–15.5)
WBC: 7.9 10*3/uL (ref 4.0–10.5)
nRBC: 0 % (ref 0.0–0.2)

## 2023-12-26 LAB — BASIC METABOLIC PANEL
Anion gap: 9 (ref 5–15)
BUN: 19 mg/dL (ref 8–23)
CO2: 33 mmol/L — ABNORMAL HIGH (ref 22–32)
Calcium: 8.2 mg/dL — ABNORMAL LOW (ref 8.9–10.3)
Chloride: 101 mmol/L (ref 98–111)
Creatinine, Ser: 0.96 mg/dL (ref 0.61–1.24)
GFR, Estimated: >60 mL/min (ref >60)
Glucose, Bld: 104 mg/dL — ABNORMAL HIGH (ref 70–99)
Potassium: 3.3 mmol/L — ABNORMAL LOW (ref 3.5–5.1)
Sodium: 143 mmol/L (ref 135–145)

## 2023-12-26 MED ORDER — ONDANSETRON HCL 4 MG/2ML IJ SOLN
4.0000 mg | Freq: Four times a day (QID) | INTRAMUSCULAR | Status: DC | PRN
  Administered 2023-12-26: 4 mg via INTRAVENOUS
  Filled 2023-12-26: qty 2

## 2023-12-26 MED ORDER — POTASSIUM CHLORIDE CRYS ER 20 MEQ PO TBCR
40.0000 meq | EXTENDED_RELEASE_TABLET | Freq: Once | ORAL | Status: AC
  Administered 2023-12-26: 40 meq via ORAL
  Filled 2023-12-26: qty 2

## 2023-12-26 MED ORDER — METOPROLOL TARTRATE 25 MG PO TABS
12.5000 mg | ORAL_TABLET | Freq: Two times a day (BID) | ORAL | Status: DC
  Administered 2023-12-26 – 2023-12-27 (×2): 12.5 mg via ORAL
  Filled 2023-12-26 (×2): qty 1

## 2023-12-26 NOTE — Plan of Care (Signed)

## 2023-12-26 NOTE — Plan of Care (Signed)

## 2023-12-26 NOTE — Progress Notes (Signed)
Physical Therapy Treatment Patient Details Name: Todd Gonzales MRN: [id number] DOB: 1941-03-01 Today's Date: 12/26/2023   History of Present Illness Todd Gonzales is a 83 y.o. male history of CHF, hypertension, hyperlipidemia, atrial fibrillation, presenting with shortness of breath.  States that shortness of breath has been ongoing for a while but got worse today.  States that he has not been taking his furosemide since he takes it on a as needed basis.  States that he is not typically on oxygen.  No new cough or fever.  States that he does have some chest pain yesterday but not today.  No abdominal pain or back pain.  No vomiting or diarrhea.  Per EMS when they arrived, he was satting 86% on room air, he was placed on 4 L nasal cannula with improvement to his oxygen status.    PT Comments  Patient is agreeable to PT session. He was seated in the chair on arrival to room. The patient does not feel back to his baseline level of functional independence. Hallway ambulation performed with rolling walker with CGA for safety. Activity tolerance limited by fatigue. Recommend to continue PT to maximize independence and facilitate return to prior level of function. Patient is hopeful for discharge to SNF at [facility] before returning to his private residence.    If plan is discharge home, recommend the following: A little help with walking and/or transfers;A little help with bathing/dressing/bathroom;Assistance with cooking/housework;Assist for transportation   Can travel by private vehicle     No  Equipment Recommendations  None recommended by PT    Recommendations for Other Services       Precautions / Restrictions Precautions Precautions: Fall Restrictions Weight Bearing Restrictions Per Provider Order: No     Mobility  Bed Mobility               General bed mobility comments: not assessed as patient sitting up on arrival and post session    Transfers Overall transfer level:  Needs assistance Equipment used: Rolling walker (2 wheels) Transfers: Sit to/from Stand Sit to Stand: Contact guard assist           General transfer comment: CGA for safety. reinforced techniques for safety    Ambulation/Gait Ambulation/Gait assistance: Contact guard assist Gait Distance (Feet): 70 Feet Assistive device: Rolling walker (2 wheels) Gait Pattern/deviations: Step-through pattern, Decreased stride length, Trunk flexed Gait velocity: decreased     General Gait Details: patient ambulated in hallway with rolling walker with CGA for safety. activity tolerance limited by fatigue. heart rate in the 60's after walking   Stairs             Wheelchair Mobility     Tilt Bed    Modified Rankin (Stroke Patients Only)       Balance Overall balance assessment: Needs assistance Sitting-balance support: Feet supported Sitting balance-Leahy Scale: Fair     Standing balance support: Bilateral upper extremity supported Standing balance-Leahy Scale: Fair Standing balance comment: using rolling walker for support                            Cognition Arousal: Alert Behavior During Therapy: WFL for tasks assessed/performed Overall Cognitive Status: Within Functional Limits for tasks assessed  Exercises      General Comments General comments (skin integrity, edema, etc.): patient is fatigued with activity. encourage energy conservation strategies      Pertinent Vitals/Pain Pain Assessment Pain Assessment: No/denies pain    Home Living                          Prior Function            PT Goals (current goals can now be found in the care plan section) Acute Rehab PT Goals Patient Stated Goal: rehab then home PT Goal Formulation: With patient Time For Goal Achievement: 01/06/24 Potential to Achieve Goals: Good Progress towards PT goals: Progressing toward goals     Frequency    Min 1X/week      PT Plan      Co-evaluation              AM-PAC PT "6 Clicks" Mobility   Outcome Measure  Help needed turning from your back to your side while in a flat bed without using bedrails?: None Help needed moving from lying on your back to sitting on the side of a flat bed without using bedrails?: A Little Help needed moving to and from a bed to a chair (including a wheelchair)?: A Little Help needed standing up from a chair using your arms (e.g., wheelchair or bedside chair)?: A Little Help needed to walk in hospital room?: A Little Help needed climbing 3-5 steps with a railing? : A Lot 6 Click Score: 18    End of Session Equipment Utilized During Treatment: Gait belt Activity Tolerance: Patient tolerated treatment well Patient left: in chair;with call bell/phone within reach;with family/visitor present   PT Visit Diagnosis: Other abnormalities of gait and mobility (R26.89);Muscle weakness (generalized) (M62.81);Difficulty in walking, not elsewhere classified (R26.2)     Time: 4010-2725 PT Time Calculation (min) (ACUTE ONLY): 26 min  Charges:    $Therapeutic Activity: 23-37 mins PT General Charges $$ ACUTE PT VISIT: 1 Visit                     Donna Bernard, PT, MPT    Ina Homes 12/26/2023, 2:04 PM

## 2023-12-26 NOTE — Progress Notes (Signed)
[facility] Progress Note  Patient: Todd Gonzales    WNU:[id number]  DOA: 12/20/2023     Date of Service: the patient was seen and examined on 12/26/2023  Chief Complaint  Patient presents with   Shortness of Breath   Brief hospital course: Todd Gonzales is a 83 y.o. male with medical history significant of morbid obesity, HFpEF, CAD, atrial fibrillation presenting with acute respiratory failure with hypoxia and hypercarbia, acute on chronic HFpEF.  Patient reports increased work of breathing over the past week or so.  Noted baseline HFpEF.  Reports intermittent compliance with Lasix.  Mild orthopnea PND as well as lower extremity swelling.  No chest pain.  No nausea or vomiting.  No focal hemiparesis or confusion.  Shortness of breath has become more pronounced.  Currently lives at assisted living facility.  Was redirected to the ER for worsening shortness of breath.  Noted to be satting at 82% on room air upon EMS arrival. Presented to ER afebrile, HR 140s-50s, BP stable. Initially in 80s on RA. Placed on BIPAP. VBG w/ hypercarbia. BNP 600s. Cr 1.2. T bili 1.6.    Assessment and Plan:  # Acute respiratory failure with hypoxia and hypercapnia, Resolved  Decompensated resp failure requiring BIPAP in setting of acute on chronic HFpEF  Noted decompensated resp acidosis on presentation  No noted prior hx/o tobacco abuse or obstructive lung disease  Suspect element of OHS/sleep apnea-noted large body habitus  D dimer  0.31 negative,Cont w/ BIPAP prn S/p Brovana nebulizer BID, d/c'd on 1/20, no more need Continue DuoNeb every 6 hourly as needed Patient needs sleep study and pulmonary follow-up as an outpatient for PFTs  # Acute on chronic heart failure with preserved ejection fraction (HFpEF) 2D ECHO 04/2023 w/ EF 60-65%  hypercarbic resp failure on presentation w/ volume overload  Noted baseline interimittent use of lasix at facility  BNP 625- well above baseline, LE edema resolved    CXR w/ cardiomegaly and vascular congestion preliminarily  S/p IV lasix  Cardiology consulted   # Atrial fibrillation with rapid ventricular response, Resolved s/p cardioversion done on 1/17 Initial HR into 150s, Improved to 50s s/p IV metoprolol x 1  continue eliquis  S/p digoxin 0.5 mg p.o. daily and S/p  Cardizem IV infusion Monitor HR w/ BB use  Cardiology consulted, sp TEE and cardioversion 1/17 Started amiodarone 400 mg p.o. twice daily for 7 days followed by 200 mg p.o. twice daily for 7 days followed by 20 mg p.o. daily as per cardiology 1/18 started metoprolol 25 mg p.o. twice daily as per cardio 1/21 bradycardia, decreased Lopressor 12.5 mg p.o. twice daily   # Hypokalemia, potassium repleted.  # Coronary artery disease, non-occlusive Baseline CAD  No active chest pain  Monitor    # HTN (hypertension) Monitor BP and titrate home regimen  Patient became hypotensive, received digoxin and Cardizem IV infusion as above 1/18 started metoprolol 25 mg p.o. twice daily as per cardio  # Iron-deficiency, transfer sat 8%, started oral iron supplement. # Vitamin D level 31.7, at lower end, started vitamin D 50k units p.o. weekly. # Vitamin B12 level 378, goal <400, started B12 oral supplement.   Obesity class I Body mass index is 33.12 kg/m.  Interventions:  Diet: Heart healthy diet, fluid restriction 1.5 L/day DVT Prophylaxis: Therapeutic Anticoagulation with Eliquis    Advance goals of care discussion: Full code  Family Communication: family was not present at bedside, at the time of interview.  The  pt provided permission to discuss medical plan with the family. Opportunity was given to ask question and all questions were answered satisfactorily.   Disposition:  Pt is from ILF (independent living [facility] ), admitted with Resp failure and A.fib, s/p cardioversion done on 1/17.stable to discharge  Discharge plan to SNF as per PT eval. Stable to discharge when bed  will be available. Follow TOC for disposition plan   Subjective: No significant events overnight, patient was stressed out for SNF placement so he did not sleep well, in the morning time he was sleepy, denied any chest pain or palpitation, no any other active issues.   Physical Exam: General: NAD, lying comfortably Appear in no distress, affect appropriate Eyes: PERRLA ENT: Oral Mucosa Clear, moist  Neck: no JVD,  Cardiovascular: S1 and S2 Present, no Murmur, bradycardia Respiratory: good respiratory effort, Bilateral Air entry equal and Decreased, no Crackles, no wheezes Abdomen: Bowel Sound present, Soft and no tenderness, obese Skin: no rashes Extremities: no Pedal edema, no calf tenderness Neurologic: without any new focal findings Gait not checked due to patient safety concerns  Vitals:   12/26/23 0600 12/26/23 0804 12/26/23 1153 12/26/23 1551  BP:  (!) 125/59 (!) 109/58 121/63  Pulse:  (!) 47 (!) 45 (!) 48  Resp:   18   Temp:  98 F (36.7 C) 98.5 F (36.9 C) 98.4 F (36.9 C)  TempSrc:   Oral   SpO2:  95% (!) 89% 93%  Weight: 104.7 kg     Height:        Intake/Output Summary (Last 24 hours) at 12/26/2023 1721 Last data filed at 12/26/2023 1057 Gross per 24 hour  Intake 240 ml  Output 700 ml  Net -460 ml   Filed Weights   12/20/23 1110 12/26/23 0600  Weight: 108.9 kg 104.7 kg    Data Reviewed: I have personally reviewed and interpreted daily labs, tele strips, imagings as discussed above. I reviewed all nursing notes, pharmacy notes, vitals, pertinent old records I have discussed plan of care as described above with RN and patient/family.  CBC: Recent Labs  Lab 12/20/23 1122 12/22/23 0607 12/23/23 0355 12/24/23 0307 12/25/23 0631 12/26/23 0540  WBC 9.4 8.4 7.3 6.9 7.1 7.9  NEUTROABS 7.9*  --   --   --   --   --   HGB 14.0 12.7* 13.5 13.0 13.2 12.9*  HCT 44.8 40.5 41.2 40.3 41.3 39.9  MCV 101.8* 103.1* 98.1 99.3 99.3 99.8  PLT 177 165 178 181 199  201   Basic Metabolic Panel: Recent Labs  Lab 12/20/23 1356 12/21/23 0812 12/22/23 0607 12/23/23 0355 12/24/23 0307 12/25/23 0631 12/26/23 0540  NA  --    < > 146* 141 142 143 143  K  --    < > 3.2* 4.1 3.4* 3.3* 3.3*  CL  --    < > 107 101 96* 100 101  CO2  --    < > 32 31 33* 34* 33*  GLUCOSE  --    < > 91 115* 105* 105* 104*  BUN  --    < > 19 22 21 20 19   CREATININE  --    < > 0.75 0.80 0.83 0.79 0.96  CALCIUM  --    < > 7.5* 8.4* 8.2* 8.3* 8.2*  MG 2.0  --  1.9 2.1 2.1  --   --   PHOS  --   --  2.5 2.4* 3.3  --   --    < > =  values in this interval not displayed.    Studies: No results found.   Scheduled Meds:  amiodarone  400 mg Oral BID   Followed by   Melene Muller ON 12/29/2023] amiodarone  200 mg Oral BID   Followed by   Melene Muller ON 01/05/2024] amiodarone  200 mg Oral Daily   apixaban  5 mg Oral BID   vitamin C  500 mg Oral Daily   bisacodyl  10 mg Oral QHS   vitamin B-12  500 mcg Oral Daily   gabapentin  300 mg Oral QHS   hydrocortisone  1 Application Rectal BID   iron polysaccharides  150 mg Oral Daily   metoprolol tartrate  12.5 mg Oral BID   polyethylene glycol  17 g Oral BID   potassium chloride  40 mEq Oral Once   Vitamin D (Ergocalciferol)  50,000 Units Oral Q7 days   Continuous Infusions:   PRN Meds: bisacodyl, ipratropium-albuterol, ondansetron (ZOFRAN) IV, sodium chloride  Time spent: 35 minutes  Author: Gillis Santa. MD [facility] 12/26/2023 5:21 PM  To reach On-call, see care teams to locate the attending and reach out to them via [email or web address]. If 7PM-7AM, please contact night-coverage If you still have difficulty reaching the attending provider, please page the [facility] (Director on Call) for [facility] on amion for assistance.

## 2023-12-27 DIAGNOSIS — I4891 Unspecified atrial fibrillation: Secondary | ICD-10-CM | POA: Diagnosis not present

## 2023-12-27 DIAGNOSIS — I509 Heart failure, unspecified: Secondary | ICD-10-CM | POA: Diagnosis not present

## 2023-12-27 DIAGNOSIS — J9601 Acute respiratory failure with hypoxia: Secondary | ICD-10-CM | POA: Diagnosis not present

## 2023-12-27 DIAGNOSIS — J9621 Acute and chronic respiratory failure with hypoxia: Secondary | ICD-10-CM | POA: Diagnosis not present

## 2023-12-27 LAB — BASIC METABOLIC PANEL
Anion gap: 10 (ref 5–15)
BUN: 20 mg/dL (ref 8–23)
CO2: 33 mmol/L — ABNORMAL HIGH (ref 22–32)
Calcium: 8.3 mg/dL — ABNORMAL LOW (ref 8.9–10.3)
Chloride: 100 mmol/L (ref 98–111)
Creatinine, Ser: 0.92 mg/dL (ref 0.61–1.24)
GFR, Estimated: >60 mL/min (ref >60)
Glucose, Bld: 98 mg/dL (ref 70–99)
Potassium: 4.4 mmol/L (ref 3.5–5.1)
Sodium: 143 mmol/L (ref 135–145)

## 2023-12-27 NOTE — Progress Notes (Signed)
Physical Therapy Treatment Patient Details Name: Todd Gonzales MRN: [id number] DOB: May 19, 1941 Today's Date: 12/27/2023   History of Present Illness Todd Gonzales is a 83 y.o. male history of CHF, hypertension, hyperlipidemia, atrial fibrillation, presenting with shortness of breath.  States that shortness of breath has been ongoing for a while but got worse today.  States that he has not been taking his furosemide since he takes it on a as needed basis.  States that he is not typically on oxygen.  No new cough or fever.  States that he does have some chest pain yesterday but not today.  No abdominal pain or back pain.  No vomiting or diarrhea.  Per EMS when they arrived, he was satting 86% on room air, he was placed on 4 L nasal cannula with improvement to his oxygen status.    PT Comments  Patient declined walking but was agreeable seated exercises for strengthening. Encouraged AROM to maximize strength for independence with functional mobility. Recommend rehabilitation < 3 hours/day after this hospital stay.    If plan is discharge home, recommend the following: A little help with walking and/or transfers;A little help with bathing/dressing/bathroom;Assistance with cooking/housework;Assist for transportation   Can travel by private vehicle     No  Equipment Recommendations  None recommended by PT    Recommendations for Other Services       Precautions / Restrictions Precautions Precautions: Fall Restrictions Weight Bearing Restrictions Per Provider Order: No     Mobility  Bed Mobility               General bed mobility comments: not assessed as patient sitting up on arrival and post session    Transfers                   General transfer comment: patient declined due to fatigue but is able to reposition himself in the recliner while seated as needed    Ambulation/Gait                   Stairs             Wheelchair Mobility     Tilt Bed     Modified Rankin (Stroke Patients Only)       Balance Overall balance assessment: Needs assistance Sitting-balance support: Feet supported Sitting balance-Leahy Scale: Fair Sitting balance - Comments: no loss of balance while sitting in the recliner without trunk support                                    Cognition Arousal: Alert Behavior During Therapy: WFL for tasks assessed/performed Overall Cognitive Status: Within Functional Limits for tasks assessed                                          Exercises General Exercises - Lower Extremity Long Arc Quad: AAROM, Strengthening, Both, 10 reps, Seated, AROM Hip ABduction/ADduction: AAROM, Strengthening, Both, 10 reps, Seated, AROM Hip Flexion/Marching: AAROM, Strengthening, Both, 10 reps, Seated, AROM Other Exercises Other Exercises: intermittent active assistance provided as needed. cues for technique for strengthening    General Comments General comments (skin integrity, edema, etc.): provided patient with LE exercises to perform while seated for strengthening BLE      Pertinent Vitals/Pain Pain Assessment Pain Assessment: No/denies pain  Home Living                          Prior Function            PT Goals (current goals can now be found in the care plan section) Acute Rehab PT Goals Patient Stated Goal: rehab then home PT Goal Formulation: With patient Time For Goal Achievement: 01/06/24 Potential to Achieve Goals: Good Progress towards PT goals: Progressing toward goals    Frequency    Min 1X/week      PT Plan      Co-evaluation              AM-PAC PT "6 Clicks" Mobility   Outcome Measure  Help needed turning from your back to your side while in a flat bed without using bedrails?: None Help needed moving from lying on your back to sitting on the side of a flat bed without using bedrails?: A Little Help needed moving to and from a bed to a chair  (including a wheelchair)?: A Little Help needed standing up from a chair using your arms (e.g., wheelchair or bedside chair)?: A Little Help needed to walk in hospital room?: A Little Help needed climbing 3-5 steps with a railing? : A Lot 6 Click Score: 18    End of Session   Activity Tolerance: Patient tolerated treatment well;Patient limited by fatigue Patient left: in chair;with call bell/phone within reach   PT Visit Diagnosis: Other abnormalities of gait and mobility (R26.89);Muscle weakness (generalized) (M62.81);Difficulty in walking, not elsewhere classified (R26.2)     Time: 0102-7253 PT Time Calculation (min) (ACUTE ONLY): 9 min  Charges:    $Therapeutic Exercise: 8-22 mins PT General Charges $$ ACUTE PT VISIT: 1 Visit                     Donna Bernard, PT, MPT    Ina Homes 12/27/2023, 9:23 AM

## 2023-12-27 NOTE — Progress Notes (Signed)
Occupational Therapy Treatment Patient Details Name: Todd Gonzales MRN: [id number] DOB: March 30, 1941 Today's Date: 12/27/2023   History of present illness TRANCE TREFRY is a 83 y.o. male history of CHF, hypertension, hyperlipidemia, atrial fibrillation, presenting with shortness of breath.  States that shortness of breath has been ongoing for a while but got worse today.  States that he has not been taking his furosemide since he takes it on a as needed basis.  States that he is not typically on oxygen.  No new cough or fever.  States that he does have some chest pain yesterday but not today.  No abdominal pain or back pain.  No vomiting or diarrhea.  Per EMS when they arrived, he was satting 86% on room air, he was placed on 4 L nasal cannula with improvement to his oxygen status.   OT comments  Chart reviewed, pt greeted in chair, agreeable to OT tx session targeting improving functional activity tolerance in the setting of ADL tasks. Improvements noted throughout with pt performing STS with CGA, amb in room with supervision with RW, intermittent vsc for RW technique. Standing balance at sink to perform grooming tasks for approx 5 minutes with supervision. Pt is making progress towards gaols, OT will continue to follow.       If plan is discharge home, recommend the following:  A little help with walking and/or transfers;Assistance with cooking/housework;Assist for transportation;A lot of help with bathing/dressing/bathroom   Equipment Recommendations  None recommended by OT    Recommendations for Other Services      Precautions / Restrictions Precautions Precautions: Fall Restrictions Weight Bearing Restrictions Per Provider Order: No       Mobility Bed Mobility               General bed mobility comments: NT in recliner pre/post session    Transfers                         Balance Overall balance assessment: Needs assistance Sitting-balance support: Feet  supported Sitting balance-Leahy Scale: Good     Standing balance support: Bilateral upper extremity supported Standing balance-Leahy Scale: Fair                             ADL either performed or assessed with clinical judgement   ADL Overall ADL's : Needs assistance/impaired     Grooming: Wash/dry face;Wash/dry hands;Oral care;Standing;Supervision/safety Grooming Details (indicate cue type and reason): with RW at sink level                 Toilet Transfer: Contact guard assist;Rolling walker (2 wheels) Toilet Transfer Details (indicate cue type and reason): simulated with RW Toileting- Clothing Manipulation and Hygiene: Supervision/safety;Set up;Sit to/from stand Toileting - Clothing Manipulation Details (indicate cue type and reason): to wipe bottom     Functional mobility during ADLs: Contact guard assist;Rolling walker (2 wheels) (approx 20' in room) General ADL Comments: standing at sink for approx 5 minutes    Extremity/Trunk Assessment              Vision       Perception     Praxis      Cognition Arousal: Alert Behavior During Therapy: WFL for tasks assessed/performed Overall Cognitive Status: Within Functional Limits for tasks assessed  Exercises Other Exercises Other Exercises: edu re: role of OT, importance of progressing mobility    Shoulder Instructions       General Comments vss throughout    Pertinent Vitals/ Pain       Pain Assessment Pain Assessment: No/denies pain  Home Living                                          Prior Functioning/Environment              Frequency  Min 1X/week        Progress Toward Goals  OT Goals(current goals can now be found in the care plan section)  Progress towards OT goals: Progressing toward goals  Acute Rehab OT Goals Time For Goal Achievement: 01/06/24  Plan      Co-evaluation                  AM-PAC OT "6 Clicks" Daily Activity     Outcome Measure   Help from another person eating meals?: None Help from another person taking care of personal grooming?: A Little Help from another person toileting, which includes using toliet, bedpan, or urinal?: A Little Help from another person bathing (including washing, rinsing, drying)?: A Lot Help from another person to put on and taking off regular upper body clothing?: A Little Help from another person to put on and taking off regular lower body clothing?: A Lot 6 Click Score: 17    End of Session Equipment Utilized During Treatment: Rolling walker (2 wheels)  OT Visit Diagnosis: Unsteadiness on feet (R26.81);Muscle weakness (generalized) (M62.81)   Activity Tolerance Patient tolerated treatment well   Patient Left in chair;with call bell/phone within reach;with chair alarm set   Nurse Communication Mobility status        Time: 1431-1447 OT Time Calculation (min): 16 min  Charges: OT General Charges $OT Visit: 1 Visit OT Treatments $Self Care/Home Management : 8-22 mins  Oleta Mouse, OTD OTR/L  12/27/23, 3:52 PM

## 2023-12-27 NOTE — TOC Initial Note (Signed)
Transition of Care [facility]) - Initial/Assessment Note    Patient Details  Name: Todd Gonzales MRN: [id number] Date of Birth: 07-23-1941  Transition of Care [facility]) CM/SW Contact:    Margarito Liner, LCSW Phone Number: 12/27/2023, 11:21 AM  Clinical Narrative:   CSW met with patient. No supports at bedside. CSW introduced role and explained that therapy recommendations would be discussed. Patient confirmed he lives at [facility] ILF and is agreeable to going to the SNF side for rehab prior to return to ILF. CSW left wife a voicemail to update. CSW started insurance authorization.               Expected Discharge Plan: Skilled Nursing Facility Barriers to Discharge: Continued Medical Work up   Patient Goals and CMS Choice     Choice offered to / list presented to : Patient      Expected Discharge Plan and Services     Post Acute Care Choice: Skilled Nursing Facility Living arrangements for the past 2 months: Independent Living Facility                                      Prior Living Arrangements/Services Living arrangements for the past 2 months: Independent Living Facility Lives with:: Spouse Patient language and need for interpreter reviewed:: Yes Do you feel safe going back to the place where you live?: Yes      Need for Family Participation in Patient Care: Yes (Comment) Care giver support system in place?: Yes (comment)   Criminal Activity/Legal Involvement Pertinent to Current Situation/Hospitalization: No - Comment as needed  Activities of Daily Living   ADL Screening (condition at time of admission) Independently performs ADLs?: Yes (appropriate for developmental age) Is the patient deaf or have difficulty hearing?: No Does the patient have difficulty seeing, even when wearing glasses/contacts?: No Does the patient have difficulty concentrating, remembering, or making decisions?: No  Permission Sought/Granted Permission sought to share information with :  Facility Medical sales representative, Family Supports Permission granted to share information with : Yes, Verbal Permission Granted  Share Information with NAME: Laval Aagaard  Permission granted to share info w AGENCY: [facility]  Permission granted to share info w Relationship: Wife  Permission granted to share info w Contact Information: [phone]  Emotional Assessment Appearance:: Appears stated age Attitude/Demeanor/Rapport: Engaged, Gracious Affect (typically observed): Accepting, Appropriate, Calm, Pleasant Orientation: : Oriented to Self, Oriented to Place, Oriented to  Time, Oriented to Situation Alcohol / Substance Use: Not Applicable Psych Involvement: No (comment)  Admission diagnosis:  Respiratory acidosis [E87.29] SOB (shortness of breath) [R06.02] Hypoxia [R09.02] Atrial fibrillation with rapid ventricular response (HCC) [I48.91] Acute respiratory failure with hypoxia (HCC) [J96.01] Acute on chronic congestive heart failure, unspecified heart failure type [facility]) [I50.9] Patient Active Problem List   Diagnosis Date Noted   Acute respiratory failure with hypoxia (HCC) 12/20/2023   Acute on chronic congestive heart failure (HCC) 12/20/2023   Acute on chronic respiratory failure with hypoxia and hypercapnia (HCC) 12/20/2023   Hypoxia 12/20/2023   Respiratory acidosis 12/20/2023   Lower urinary tract symptoms (LUTS) 11/01/2023   Right leg pain 07/13/2023   Paroxysmal atrial fibrillation/flutter (HCC) 04/15/2023   PVC (premature ventricular contraction) 04/14/2023   Atrial flutter (HCC) 04/14/2023   Irregular heartbeat 04/14/2023   Atrial fibrillation with rapid ventricular response (HCC) 04/14/2023   Coronary artery disease, non-occlusive 03/26/2023   B12 deficiency 12/28/2022  SOB (shortness of breath) 12/26/2022   Bilateral lower extremity edema 12/26/2022   Knee pain 08/29/2022   History of prostatitis 08/19/2022   Constipation 05/08/2022   Dysuria 04/13/2022    Urinary frequency 04/05/2022   Abnormal urinalysis 04/05/2022   Balance problem 02/16/2022   Thumb pain 03/31/2020   Muscle cramp 03/31/2020   Cervical radiculopathy 12/13/2019   Right arm pain 10/10/2019   Vitamin D deficiency 10/10/2019   Incontinence of feces    Post-op pain    Hyperglycemia    Status post laminectomy    Postoperative pain after spinal surgery 11/30/2018   Myelopathy concurrent with and due to stenosis of lumbar spine (HCC) 11/20/2018   Microalbuminuria 09/02/2016   HLD (hyperlipidemia) 09/02/2016   Spinal stenosis    Other fatigue 08/24/2016   HTN (hypertension) 08/24/2016   BPH (benign prostatic hyperplasia) 08/24/2016   Nocturia 08/24/2016   Advance care planning 08/24/2016   Osteoarthritis 10/28/2014   SCC (squamous cell carcinoma), face 09/25/2014   S/P Left THA, AA 03/20/2012   PCP:  Joaquim Nam, MD Pharmacy:   [facility] - [place], [place] - [address] [place] [address] Phone: [phone] Fax: [phone]  [facility] [address], [place] - [address] [place] [address] Phone: [phone] Fax: [phone]     Social Drivers of Health (SDOH) Social History: SDOH Screenings   Food Insecurity: No Food Insecurity (12/22/2023)  Housing: Low Risk  (12/22/2023)  Transportation Needs: No Transportation Needs (12/22/2023)  Utilities: Not At Risk (12/22/2023)  Alcohol Screen: Low Risk  (08/08/2023)  Depression (PHQ2-9): Low Risk  (08/08/2023)  Financial Resource Strain: Low Risk  (08/08/2023)  Physical Activity: Inactive (08/08/2023)  Social Connections: Socially Integrated (12/22/2023)  Stress: No Stress Concern Present (08/08/2023)  Tobacco Use: Low Risk  (12/20/2023)  Health Literacy: Adequate Health Literacy (08/08/2023)   SDOH Interventions:     Readmission Risk Interventions    04/16/2023   11:32 AM  Readmission Risk Prevention Plan  Post Dischage Appt Complete   Medication Screening Complete  Transportation Screening Complete

## 2023-12-27 NOTE — NC FL2 (Signed)
[place] MEDICAID FL2 LEVEL OF CARE FORM     IDENTIFICATION  Patient Name: Todd Gonzales Birthdate: 07/14/1941 Sex: male Admission Date (Current Location): 12/20/2023  [facility] and [place] Number:  Chiropodist and Address:  [facility], [address] [place], [place] [address]      Provider Number: [id number]  Attending Physician Name and Address:  Arnetha Courser, MD  Relative Name and Phone Number:       Current Level of Care: Hospital Recommended Level of Care: Skilled Nursing Facility Prior Approval Number:    Date Approved/Denied:   PASRR Number: [id number]  Discharge Plan: SNF    Current Diagnoses: Patient Active Problem List   Diagnosis Date Noted   Acute respiratory failure with hypoxia (HCC) 12/20/2023   Acute on chronic congestive heart failure (HCC) 12/20/2023   Acute on chronic respiratory failure with hypoxia and hypercapnia (HCC) 12/20/2023   Hypoxia 12/20/2023   Respiratory acidosis 12/20/2023   Lower urinary tract symptoms (LUTS) 11/01/2023   Right leg pain 07/13/2023   Paroxysmal atrial fibrillation/flutter (HCC) 04/15/2023   PVC (premature ventricular contraction) 04/14/2023   Atrial flutter (HCC) 04/14/2023   Irregular heartbeat 04/14/2023   Atrial fibrillation with rapid ventricular response (HCC) 04/14/2023   Coronary artery disease, non-occlusive 03/26/2023   B12 deficiency 12/28/2022   SOB (shortness of breath) 12/26/2022   Bilateral lower extremity edema 12/26/2022   Knee pain 08/29/2022   History of prostatitis 08/19/2022   Constipation 05/08/2022   Dysuria 04/13/2022   Urinary frequency 04/05/2022   Abnormal urinalysis 04/05/2022   Balance problem 02/16/2022   Thumb pain 03/31/2020   Muscle cramp 03/31/2020   Cervical radiculopathy 12/13/2019   Right arm pain 10/10/2019   Vitamin D deficiency 10/10/2019   Incontinence of feces    Post-op pain    Hyperglycemia    Status post laminectomy     Postoperative pain after spinal surgery 11/30/2018   Myelopathy concurrent with and due to stenosis of lumbar spine (HCC) 11/20/2018   Microalbuminuria 09/02/2016   HLD (hyperlipidemia) 09/02/2016   Spinal stenosis    Other fatigue 08/24/2016   HTN (hypertension) 08/24/2016   BPH (benign prostatic hyperplasia) 08/24/2016   Nocturia 08/24/2016   Advance care planning 08/24/2016   Osteoarthritis 10/28/2014   SCC (squamous cell carcinoma), face 09/25/2014   S/P Left THA, AA 03/20/2012    Orientation RESPIRATION BLADDER Height & Weight     Self, Time, Situation, Place  Normal Continent Weight: 230 lb 6.4 oz (104.5 kg) Height:  5\' 10"  (177.8 cm)  BEHAVIORAL SYMPTOMS/MOOD NEUROLOGICAL BOWEL NUTRITION STATUS   (None)  (None) Continent Diet (Heart healthy)  AMBULATORY STATUS COMMUNICATION OF NEEDS Skin   Limited Assist Verbally Other (Comment) (Erythema/redness.)                       Personal Care Assistance Level of Assistance  Bathing, Feeding, Dressing Bathing Assistance: Limited assistance Feeding assistance: Limited assistance Dressing Assistance: Limited assistance     Functional Limitations Info  Sight, Hearing, Speech Sight Info: Adequate Hearing Info: Adequate Speech Info: Adequate    SPECIAL CARE FACTORS FREQUENCY  PT (By licensed PT), OT (By licensed OT)     PT Frequency: 5 x week OT Frequency: 5 x week            Contractures Contractures Info: Not present    Additional Factors Info  Code Status, Allergies Code Status Info: Full code Allergies Info: Codeine, Lipitor (Atorvastatin),  Bactrim (Sulfamethoxazole-trimethoprim)           Current Medications (12/27/2023):  This is the current hospital active medication list Current Facility-Administered Medications  Medication Dose Route Frequency Provider Last Rate Last Admin   amiodarone (PACERONE) tablet 400 mg  400 mg Oral BID End, Christopher, MD   400 mg at 12/27/23 1027   Followed by   Melene Muller  ON 12/29/2023] amiodarone (PACERONE) tablet 200 mg  200 mg Oral BID End, Cristal Deer, MD       Followed by   Melene Muller ON 01/05/2024] amiodarone (PACERONE) tablet 200 mg  200 mg Oral Daily End, Cristal Deer, MD       apixaban Everlene Balls) tablet 5 mg  5 mg Oral BID Floydene Flock, MD   5 mg at 12/27/23 2536   ascorbic acid (VITAMIN C) tablet 500 mg  500 mg Oral Daily Gillis Santa, MD   500 mg at 12/27/23 0944   bisacodyl (DULCOLAX) EC tablet 10 mg  10 mg Oral QHS Gillis Santa, MD   10 mg at 12/26/23 2200   bisacodyl (DULCOLAX) suppository 10 mg  10 mg Rectal Daily PRN Gillis Santa, MD       cyanocobalamin (VITAMIN B12) tablet 500 mcg  500 mcg Oral Daily Gillis Santa, MD   500 mcg at 12/27/23 0944   gabapentin (NEURONTIN) capsule 300 mg  300 mg Oral QHS Gillis Santa, MD   300 mg at 12/26/23 2159   hydrocortisone (ANUSOL-HC) 2.5 % rectal cream 1 Application  1 Application Rectal BID Gillis Santa, MD   1 Application at 12/26/23 2201   ipratropium-albuterol (DUONEB) 0.5-2.5 (3) MG/3ML nebulizer solution 3 mL  3 mL Nebulization Q6H PRN Gillis Santa, MD       iron polysaccharides (NIFEREX) capsule 150 mg  150 mg Oral Daily Gillis Santa, MD   150 mg at 12/27/23 0948   metoprolol tartrate (LOPRESSOR) tablet 12.5 mg  12.5 mg Oral BID Gillis Santa, MD   12.5 mg at 12/27/23 0943   ondansetron (ZOFRAN) injection 4 mg  4 mg Intravenous Q6H PRN Gillis Santa, MD   4 mg at 12/26/23 1055   polyethylene glycol (MIRALAX / GLYCOLAX) packet 17 g  17 g Oral BID Gillis Santa, MD   17 g at 12/26/23 0908   sodium chloride (OCEAN) 0.65 % nasal spray 1 spray  1 spray Each Nare PRN Manuela Schwartz, NP   1 spray at 12/22/23 2255   Vitamin D (Ergocalciferol) (DRISDOL) 1.25 MG (50000 UNIT) capsule 50,000 Units  50,000 Units Oral Q7 days Gillis Santa, MD   50,000 Units at 12/23/23 6440     Discharge Medications: Please see discharge summary for a list of discharge medications.  Relevant Imaging Results:  Relevant Lab  Results:   Additional Information SS#: [id number]  Margarito Liner, LCSW

## 2023-12-27 NOTE — Progress Notes (Signed)
[facility] Progress Note  Patient: Todd Gonzales    GMW:[id number]  DOA: 12/20/2023     Date of Service: the patient was seen and examined on 12/27/2023  Chief Complaint  Patient presents with   Shortness of Breath   Brief hospital course: Todd Gonzales is a 83 y.o. male with medical history significant of morbid obesity, HFpEF, CAD, atrial fibrillation presenting with acute respiratory failure with hypoxia and hypercarbia, acute on chronic HFpEF.  Patient reports increased work of breathing over the past week or so.  Noted baseline HFpEF.  Reports intermittent compliance with Lasix.  Mild orthopnea PND as well as lower extremity swelling.  No chest pain.  No nausea or vomiting.  No focal hemiparesis or confusion.  Shortness of breath has become more pronounced.  Currently lives at assisted living facility.  Was redirected to the ER for worsening shortness of breath.  Noted to be satting at 82% on room air upon EMS arrival. Presented to ER afebrile, HR 140s-50s, BP stable. Initially in 80s on RA. Placed on BIPAP. VBG w/ hypercarbia. BNP 600s. Cr 1.2. T bili 1.6.   1/22: S/p DCCV and now in sinus rhythm.  Cardiology signed off on 1/18.  Discontinuing metoprolol due to borderline bradycardia.  Patient is also on amiodarone. Currently medically stable and awaiting insurance authorization for SNF.   Assessment and Plan:  # Acute respiratory failure with hypoxia and hypercapnia, Resolved  Decompensated resp failure requiring BIPAP in setting of acute on chronic HFpEF  Noted decompensated resp acidosis on presentation  No noted prior hx/o tobacco abuse or obstructive lung disease  Suspect element of OHS/sleep apnea-noted large body habitus  D dimer  0.31 negative,Cont w/ BIPAP prn S/p Brovana nebulizer BID, d/c'd on 1/20, no more need Continue DuoNeb every 6 hourly as needed Patient needs sleep study and pulmonary follow-up as an outpatient for PFTs  # Acute on chronic heart failure with  preserved ejection fraction (HFpEF) 2D ECHO 04/2023 w/ EF 60-65%  hypercarbic resp failure on presentation w/ volume overload  Noted baseline interimittent use of lasix at facility  BNP 625- well above baseline, LE edema resolved   CXR w/ cardiomegaly and vascular congestion preliminarily  S/p IV lasix  Cardiology consulted   # Atrial fibrillation with rapid ventricular response, Resolved s/p cardioversion done on 1/17 Initial HR into 150s, Improved to 50s s/p IV metoprolol x 1  continue eliquis  S/p digoxin 0.5 mg p.o. daily and S/p  Cardizem IV infusion Monitor HR w/ BB use  Cardiology consulted, sp TEE and cardioversion 1/17 Started amiodarone 400 mg p.o. twice daily for 7 days followed by 200 mg p.o. twice daily for 7 days followed by 20 mg p.o. daily as per cardiology 1/18 started metoprolol 25 mg p.o. twice daily as per cardio 1/21 bradycardia, decreased Lopressor 12.5 mg p.o. twice daily 1/22: Remained borderline bradycardic, discontinuing Lopressor  # Hypokalemia, potassium repleted.  # Coronary artery disease, non-occlusive Baseline CAD  No active chest pain  Monitor    # HTN (hypertension) Monitor BP and titrate home regimen  Patient became hypotensive, received digoxin and Cardizem IV infusion as above 1/18 started metoprolol 25 mg p.o. twice daily as per cardio  # Iron-deficiency, transfer sat 8%, started oral iron supplement. # Vitamin D level 31.7, at lower end, started vitamin D 50k units p.o. weekly. # Vitamin B12 level 378, goal <400, started B12 oral supplement.   Obesity class I Body mass index is 33.06 kg/m.  Interventions:  Diet: Heart healthy diet, fluid restriction 1.5 L/day DVT Prophylaxis: Therapeutic Anticoagulation with Eliquis    Advance goals of care discussion: Full code  Family Communication:   Disposition:  Pt is from ILF (independent living [facility] ), admitted with Resp failure and A.fib, s/p cardioversion done on 1/17.stable to  discharge  Discharge plan to SNF as per PT eval. Stable to discharge when bed will be available. Follow TOC for disposition plan   Subjective: Patient was sitting comfortably in chair when seen today.  No new concern.  Physical Exam:  General.  Obese elderly man, in no acute distress. Pulmonary.  Lungs clear bilaterally, normal respiratory effort. CV.  Regular rate and rhythm, no JVD, rub or murmur. Abdomen.  Soft, nontender, nondistended, BS positive. CNS.  Alert and oriented .  No focal neurologic deficit. Extremities.  No edema, no cyanosis, pulses intact and symmetrical. Psychiatry.  Judgment and insight appears normal.   Vitals:   12/27/23 0349 12/27/23 0748 12/27/23 0847 12/27/23 1150  BP: (!) 121/56 117/65  (!) 115/58  Pulse: (!) 50 (!) 49  (!) 49  Resp: 18     Temp: 98 F (36.7 C) (!) 97.4 F (36.3 C)  98.2 F (36.8 C)  TempSrc: Oral     SpO2: 90% 92%  91%  Weight:   104.5 kg   Height:        Intake/Output Summary (Last 24 hours) at 12/27/2023 1559 Last data filed at 12/27/2023 1417 Gross per 24 hour  Intake 240 ml  Output 250 ml  Net -10 ml   Filed Weights   12/20/23 1110 12/26/23 0600 12/27/23 0847  Weight: 108.9 kg 104.7 kg 104.5 kg    Data Reviewed: Prior data reviewed  CBC: Recent Labs  Lab 12/22/23 0607 12/23/23 0355 12/24/23 0307 12/25/23 0631 12/26/23 0540  WBC 8.4 7.3 6.9 7.1 7.9  HGB 12.7* 13.5 13.0 13.2 12.9*  HCT 40.5 41.2 40.3 41.3 39.9  MCV 103.1* 98.1 99.3 99.3 99.8  PLT 165 178 181 199 201   Basic Metabolic Panel: Recent Labs  Lab 12/22/23 0607 12/23/23 0355 12/24/23 0307 12/25/23 0631 12/26/23 0540 12/27/23 0559  NA 146* 141 142 143 143 143  K 3.2* 4.1 3.4* 3.3* 3.3* 4.4  CL 107 101 96* 100 101 100  CO2 32 31 33* 34* 33* 33*  GLUCOSE 91 115* 105* 105* 104* 98  BUN 19 22 21 20 19 20   CREATININE 0.75 0.80 0.83 0.79 0.96 0.92  CALCIUM 7.5* 8.4* 8.2* 8.3* 8.2* 8.3*  MG 1.9 2.1 2.1  --   --   --   PHOS 2.5 2.4* 3.3  --    --   --     Studies: No results found.   Scheduled Meds:  amiodarone  400 mg Oral BID   Followed by   Melene Muller ON 12/29/2023] amiodarone  200 mg Oral BID   Followed by   Melene Muller ON 01/05/2024] amiodarone  200 mg Oral Daily   apixaban  5 mg Oral BID   vitamin C  500 mg Oral Daily   bisacodyl  10 mg Oral QHS   vitamin B-12  500 mcg Oral Daily   gabapentin  300 mg Oral QHS   hydrocortisone  1 Application Rectal BID   iron polysaccharides  150 mg Oral Daily   metoprolol tartrate  12.5 mg Oral BID   polyethylene glycol  17 g Oral BID   Vitamin D (Ergocalciferol)  50,000 Units Oral Q7 days  Continuous Infusions:   PRN Meds: bisacodyl, ipratropium-albuterol, ondansetron (ZOFRAN) IV, sodium chloride  Time spent: 44 minutes  Author: Arnetha Courser. MD [facility] 12/27/2023 3:59 PM  To reach On-call, see care teams to locate the attending and reach out to them via [email or web address]. If 7PM-7AM, please contact night-coverage If you still have difficulty reaching the attending provider, please page the [facility] (Director on Call) for [facility] on amion for assistance.

## 2023-12-27 NOTE — Plan of Care (Signed)

## 2023-12-28 DIAGNOSIS — J9621 Acute and chronic respiratory failure with hypoxia: Secondary | ICD-10-CM | POA: Diagnosis not present

## 2023-12-28 DIAGNOSIS — I4891 Unspecified atrial fibrillation: Secondary | ICD-10-CM | POA: Diagnosis not present

## 2023-12-28 DIAGNOSIS — J9601 Acute respiratory failure with hypoxia: Secondary | ICD-10-CM | POA: Diagnosis not present

## 2023-12-28 DIAGNOSIS — I509 Heart failure, unspecified: Secondary | ICD-10-CM | POA: Diagnosis not present

## 2023-12-28 MED ORDER — AMIODARONE HCL 200 MG PO TABS
200.0000 mg | ORAL_TABLET | Freq: Every day | ORAL | Status: DC
  Administered 2024-01-04 – 2024-01-08 (×5): 200 mg via ORAL
  Filled 2023-12-28 (×5): qty 1

## 2023-12-28 MED ORDER — AMIODARONE HCL 200 MG PO TABS
200.0000 mg | ORAL_TABLET | Freq: Two times a day (BID) | ORAL | Status: AC
  Administered 2023-12-28 – 2024-01-03 (×14): 200 mg via ORAL
  Filled 2023-12-28 (×14): qty 1

## 2023-12-28 NOTE — Progress Notes (Signed)
[facility] Progress Note  Patient: Todd Gonzales    QIO:[id number]  DOA: 12/20/2023     Date of Service: the patient was seen and examined on 12/28/2023  Chief Complaint  Patient presents with   Shortness of Breath   Brief hospital course: Todd Gonzales is a 83 y.o. male with medical history significant of morbid obesity, HFpEF, CAD, atrial fibrillation presenting with acute respiratory failure with hypoxia and hypercarbia, acute on chronic HFpEF.  Patient reports increased work of breathing over the past week or so.  Noted baseline HFpEF.  Reports intermittent compliance with Lasix.  Mild orthopnea PND as well as lower extremity swelling.  No chest pain.  No nausea or vomiting.  No focal hemiparesis or confusion.  Shortness of breath has become more pronounced.  Currently lives at assisted living facility.  Was redirected to the ER for worsening shortness of breath.  Noted to be satting at 82% on room air upon EMS arrival. Presented to ER afebrile, HR 140s-50s, BP stable. Initially in 80s on RA. Placed on BIPAP. VBG w/ hypercarbia. BNP 600s. Cr 1.2. T bili 1.6.   1/22: S/p DCCV and now in sinus rhythm.  Cardiology signed off on 1/18.  Discontinuing metoprolol due to borderline bradycardia.  Patient is also on amiodarone. Currently medically stable and awaiting insurance authorization for SNF.  1/23: Remained hemodynamically stable, still awaiting insurance authorization.   Assessment and Plan:  # Acute respiratory failure with hypoxia and hypercapnia, Resolved  Decompensated resp failure requiring BIPAP in setting of acute on chronic HFpEF  Noted decompensated resp acidosis on presentation  No noted prior hx/o tobacco abuse or obstructive lung disease  Suspect element of OHS/sleep apnea-noted large body habitus  D dimer  0.31 negative,Cont w/ BIPAP prn S/p Brovana nebulizer BID, d/c'd on 1/20, no more need Continue DuoNeb every 6 hourly as needed Patient needs sleep study and  pulmonary follow-up as an outpatient for PFTs  # Acute on chronic heart failure with preserved ejection fraction (HFpEF) 2D ECHO 04/2023 w/ EF 60-65%  hypercarbic resp failure on presentation w/ volume overload  Noted baseline interimittent use of lasix at facility  BNP 625- well above baseline, LE edema resolved   CXR w/ cardiomegaly and vascular congestion preliminarily  S/p IV lasix  Cardiology consulted   # Atrial fibrillation with rapid ventricular response, Resolved s/p cardioversion done on 1/17 Initial HR into 150s, Improved to 50s s/p IV metoprolol x 1  continue eliquis  S/p digoxin 0.5 mg p.o. daily and S/p  Cardizem IV infusion Monitor HR w/ BB use  Cardiology consulted, sp TEE and cardioversion 1/17 Started amiodarone 400 mg p.o. twice daily for 7 days followed by 200 mg p.o. twice daily for 7 days followed by 20 mg p.o. daily as per cardiology 1/18 started metoprolol 25 mg p.o. twice daily as per cardio 1/21 bradycardia, decreased Lopressor 12.5 mg p.o. twice daily 1/22: Remained borderline bradycardic, discontinuing Lopressor  # Hypokalemia, potassium repleted.  # Coronary artery disease, non-occlusive Baseline CAD  No active chest pain  Monitor    # HTN (hypertension) Monitor BP and titrate home regimen  Patient became hypotensive, received digoxin and Cardizem IV infusion as above 1/18 started metoprolol 25 mg p.o. twice daily as per cardio  # Iron-deficiency, transfer sat 8%, started oral iron supplement. # Vitamin D level 31.7, at lower end, started vitamin D 50k units p.o. weekly. # Vitamin B12 level 378, goal <400, started B12 oral supplement.   Obesity  class I Body mass index is 33.06 kg/m.  Interventions:  Diet: Heart healthy diet, fluid restriction 1.5 L/day DVT Prophylaxis: Therapeutic Anticoagulation with Eliquis    Advance goals of care discussion: Full code  Family Communication: Discussed with wife at bedside  Disposition:  Pt is from ILF  (independent living [place] ), admitted with Resp failure and A.fib, s/p cardioversion done on 1/17.stable to discharge  Discharge plan to SNF as per PT eval. Stable to discharge when bed will be available. Follow TOC for disposition plan   Subjective: Patient was sitting comfortably when seen today.  He wants to leave the hospital.  Physical Exam:  General.  Obese elderly man, in no acute distress. Pulmonary.  Lungs clear bilaterally, normal respiratory effort. CV.  Regular rate and rhythm, no JVD, rub or murmur. Abdomen.  Soft, nontender, nondistended, BS positive. CNS.  Alert and oriented .  No focal neurologic deficit. Extremities.  No edema, no cyanosis, pulses intact and symmetrical.   Vitals:   12/27/23 2349 12/28/23 0411 12/28/23 0746 12/28/23 1217  BP: (!) 102/50 115/63 (!) 121/49 (!) 119/59  Pulse: (!) 52 (!) 53 (!) 51 (!) 45  Resp: 16 19 18 18   Temp: 98.1 F (36.7 C) 98.3 F (36.8 C) (!) 97.5 F (36.4 C) 98.6 F (37 C)  TempSrc:   Oral Oral  SpO2: 94% 90% 96% 94%  Weight:      Height:        Intake/Output Summary (Last 24 hours) at 12/28/2023 1456 Last data filed at 12/28/2023 0900 Gross per 24 hour  Intake 440 ml  Output 700 ml  Net -260 ml   Filed Weights   12/20/23 1110 12/26/23 0600 12/27/23 0847  Weight: 108.9 kg 104.7 kg 104.5 kg    Data Reviewed: Prior data reviewed  CBC: Recent Labs  Lab 12/22/23 0607 12/23/23 0355 12/24/23 0307 12/25/23 0631 12/26/23 0540  WBC 8.4 7.3 6.9 7.1 7.9  HGB 12.7* 13.5 13.0 13.2 12.9*  HCT 40.5 41.2 40.3 41.3 39.9  MCV 103.1* 98.1 99.3 99.3 99.8  PLT 165 178 181 199 201   Basic Metabolic Panel: Recent Labs  Lab 12/22/23 0607 12/23/23 0355 12/24/23 0307 12/25/23 0631 12/26/23 0540 12/27/23 0559  NA 146* 141 142 143 143 143  K 3.2* 4.1 3.4* 3.3* 3.3* 4.4  CL 107 101 96* 100 101 100  CO2 32 31 33* 34* 33* 33*  GLUCOSE 91 115* 105* 105* 104* 98  BUN 19 22 21 20 19 20   CREATININE 0.75 0.80 0.83  0.79 0.96 0.92  CALCIUM 7.5* 8.4* 8.2* 8.3* 8.2* 8.3*  MG 1.9 2.1 2.1  --   --   --   PHOS 2.5 2.4* 3.3  --   --   --     Studies: No results found.   Scheduled Meds:  amiodarone  200 mg Oral BID   Followed by   Melene Muller ON 01/04/2024] amiodarone  200 mg Oral Daily   apixaban  5 mg Oral BID   vitamin C  500 mg Oral Daily   bisacodyl  10 mg Oral QHS   vitamin B-12  500 mcg Oral Daily   gabapentin  300 mg Oral QHS   hydrocortisone  1 Application Rectal BID   iron polysaccharides  150 mg Oral Daily   polyethylene glycol  17 g Oral BID   Vitamin D (Ergocalciferol)  50,000 Units Oral Q7 days   Continuous Infusions:   PRN Meds: bisacodyl, ipratropium-albuterol, ondansetron (ZOFRAN) IV,  sodium chloride  Time spent: 40 minutes  Author: Arnetha Courser. MD [facility] 12/28/2023 2:56 PM  To reach On-call, see care teams to locate the attending and reach out to them via [email or web address]. If 7PM-7AM, please contact night-coverage If you still have difficulty reaching the attending provider, please page the [facility] (Director on Call) for [facility] on amion for assistance.

## 2023-12-28 NOTE — Plan of Care (Signed)

## 2023-12-28 NOTE — TOC Progression Note (Addendum)
Transition of Care [facility]) - Progression Note    Patient Details  Name: Todd Gonzales MRN: [id number] Date of Birth: 1941-01-03  Transition [facility]) CM/SW Contact  Truddie Hidden, RN Phone Number: 12/28/2023, 3:02 PM  Clinical Narrative:    Per [facility] portal Berkley Harvey is still pending.   Spoke with patient's wife to update about authorization. Patient wife advised of average time frame for authorization decisions.    Expected Discharge Plan: Skilled Nursing Facility Barriers to Discharge: Continued Medical Work up  Expected Discharge Plan and Services     Post Acute Care Choice: Skilled Nursing Facility Living arrangements for the past 2 months: Independent Living Facility                                       Social Determinants of Health (SDOH) Interventions SDOH Screenings   Food Insecurity: No Food Insecurity (12/22/2023)  Housing: Low Risk  (12/22/2023)  Transportation Needs: No Transportation Needs (12/22/2023)  Utilities: Not At Risk (12/22/2023)  Alcohol Screen: Low Risk  (08/08/2023)  Depression (PHQ2-9): Low Risk  (08/08/2023)  Financial Resource Strain: Low Risk  (08/08/2023)  Physical Activity: Inactive (08/08/2023)  Social Connections: Socially Integrated (12/22/2023)  Stress: No Stress Concern Present (08/08/2023)  Tobacco Use: Low Risk  (12/20/2023)  Health Literacy: Adequate Health Literacy (08/08/2023)    Readmission Risk Interventions    04/16/2023   11:32 AM  Readmission Risk Prevention Plan  Post Dischage Appt Complete  Medication Screening Complete  Transportation Screening Complete

## 2023-12-29 DIAGNOSIS — J9601 Acute respiratory failure with hypoxia: Secondary | ICD-10-CM | POA: Diagnosis not present

## 2023-12-29 DIAGNOSIS — I509 Heart failure, unspecified: Secondary | ICD-10-CM | POA: Diagnosis not present

## 2023-12-29 DIAGNOSIS — I4891 Unspecified atrial fibrillation: Secondary | ICD-10-CM | POA: Diagnosis not present

## 2023-12-29 DIAGNOSIS — J9621 Acute and chronic respiratory failure with hypoxia: Secondary | ICD-10-CM | POA: Diagnosis not present

## 2023-12-29 NOTE — Progress Notes (Signed)
[facility] Progress Note  Patient: Todd Gonzales    ZOX:[id number]  DOA: 12/20/2023     Date of Service: the patient was seen and examined on 12/29/2023  Chief Complaint  Patient presents with   Shortness of Breath   Brief hospital course: Todd Gonzales is a 83 y.o. male with medical history significant of morbid obesity, HFpEF, CAD, atrial fibrillation presenting with acute respiratory failure with hypoxia and hypercarbia, acute on chronic HFpEF.  Patient reports increased work of breathing over the past week or so.  Noted baseline HFpEF.  Reports intermittent compliance with Lasix.  Mild orthopnea PND as well as lower extremity swelling.  No chest pain.  No nausea or vomiting.  No focal hemiparesis or confusion.  Shortness of breath has become more pronounced.  Currently lives at assisted living facility.  Was redirected to the ER for worsening shortness of breath.  Noted to be satting at 82% on room air upon EMS arrival. Presented to ER afebrile, HR 140s-50s, BP stable. Initially in 80s on RA. Placed on BIPAP. VBG w/ hypercarbia. BNP 600s. Cr 1.2. T bili 1.6.   1/22: S/p DCCV and now in sinus rhythm.  Cardiology signed off on 1/18.  Discontinuing metoprolol due to borderline bradycardia.  Patient is also on amiodarone. Currently medically stable and awaiting insurance authorization for SNF.  1/23: Remained hemodynamically stable, still awaiting insurance authorization.  1/24: Still pending final decision from insurance, peer to peer was done and updated PT notes were given as requested.   Assessment and Plan:  # Acute respiratory failure with hypoxia and hypercapnia, Resolved  Decompensated resp failure requiring BIPAP in setting of acute on chronic HFpEF  Noted decompensated resp acidosis on presentation  No noted prior hx/o tobacco abuse or obstructive lung disease  Suspect element of OHS/sleep apnea-noted large body habitus  D dimer  0.31 negative,Cont w/ BIPAP prn S/p  Brovana nebulizer BID, d/c'd on 1/20, no more need Continue DuoNeb every 6 hourly as needed Patient needs sleep study and pulmonary follow-up as an outpatient for PFTs  # Acute on chronic heart failure with preserved ejection fraction (HFpEF) 2D ECHO 04/2023 w/ EF 60-65%  hypercarbic resp failure on presentation w/ volume overload  Noted baseline interimittent use of lasix at facility  BNP 625- well above baseline, LE edema resolved   CXR w/ cardiomegaly and vascular congestion preliminarily  S/p IV lasix  Cardiology consulted   # Atrial fibrillation with rapid ventricular response, Resolved s/p cardioversion done on 1/17 Initial HR into 150s, Improved to 50s s/p IV metoprolol x 1  continue eliquis  S/p digoxin 0.5 mg p.o. daily and S/p  Cardizem IV infusion Monitor HR w/ BB use  Cardiology consulted, sp TEE and cardioversion 1/17 Started amiodarone 400 mg p.o. twice daily for 7 days followed by 200 mg p.o. twice daily for 7 days followed by 20 mg p.o. daily as per cardiology 1/18 started metoprolol 25 mg p.o. twice daily as per cardio 1/21 bradycardia, decreased Lopressor 12.5 mg p.o. twice daily 1/22: Remained borderline bradycardic, discontinuing Lopressor  # Hypokalemia, potassium repleted.  # Coronary artery disease, non-occlusive Baseline CAD  No active chest pain  Monitor    # HTN (hypertension) Monitor BP and titrate home regimen  Patient became hypotensive, received digoxin and Cardizem IV infusion as above 1/18 started metoprolol 25 mg p.o. twice daily as per cardio  # Iron-deficiency, transfer sat 8%, started oral iron supplement. # Vitamin D level 31.7, at lower end,  started vitamin D 50k units p.o. weekly. # Vitamin B12 level 378, goal <400, started B12 oral supplement.   Obesity class I Body mass index is 33.06 kg/m.  Interventions:  Diet: Heart healthy diet, fluid restriction 1.5 L/day DVT Prophylaxis: Therapeutic Anticoagulation with Eliquis    Advance  goals of care discussion: Full code  Family Communication:   Disposition:  Pt is from ILF (independent living [place] ), admitted with Resp failure and A.fib, s/p cardioversion done on 1/17.stable to discharge  Discharge plan to SNF as per PT eval. Stable to discharge when bed will be available. Follow TOC for disposition plan   Subjective: Patient was seen and examined today.  No new concern, just feeling overall weak.  Physical Exam:  General.  Obese gentleman, in no acute distress. Pulmonary.  Lungs clear bilaterally, normal respiratory effort. CV.  Regular rate and rhythm, no JVD, rub or murmur. Abdomen.  Soft, nontender, nondistended, BS positive. CNS.  Alert and oriented .  No focal neurologic deficit. Extremities.  No edema, no cyanosis, pulses intact and symmetrical.   Vitals:   12/28/23 2308 12/29/23 0401 12/29/23 0841 12/29/23 1546  BP: 124/61 127/65 (!) 124/91 (!) 142/74  Pulse: (!) 52 (!) 51 (!) 56 (!) 55  Resp: 16 16 17  (!) 21  Temp: 97.8 F (36.6 C) 98.4 F (36.9 C) 98.2 F (36.8 C) 97.8 F (36.6 C)  TempSrc: Oral Oral Oral Oral  SpO2: 95% 93% 94% 96%  Weight:      Height:        Intake/Output Summary (Last 24 hours) at 12/29/2023 1651 Last data filed at 12/29/2023 0900 Gross per 24 hour  Intake 360 ml  Output 400 ml  Net -40 ml   Filed Weights   12/20/23 1110 12/26/23 0600 12/27/23 0847  Weight: 108.9 kg 104.7 kg 104.5 kg    Data Reviewed: Prior data reviewed  CBC: Recent Labs  Lab 12/23/23 0355 12/24/23 0307 12/25/23 0631 12/26/23 0540  WBC 7.3 6.9 7.1 7.9  HGB 13.5 13.0 13.2 12.9*  HCT 41.2 40.3 41.3 39.9  MCV 98.1 99.3 99.3 99.8  PLT 178 181 199 201   Basic Metabolic Panel: Recent Labs  Lab 12/23/23 0355 12/24/23 0307 12/25/23 0631 12/26/23 0540 12/27/23 0559  NA 141 142 143 143 143  K 4.1 3.4* 3.3* 3.3* 4.4  CL 101 96* 100 101 100  CO2 31 33* 34* 33* 33*  GLUCOSE 115* 105* 105* 104* 98  BUN 22 21 20 19 20   CREATININE  0.80 0.83 0.79 0.96 0.92  CALCIUM 8.4* 8.2* 8.3* 8.2* 8.3*  MG 2.1 2.1  --   --   --   PHOS 2.4* 3.3  --   --   --     Studies: No results found.   Scheduled Meds:  amiodarone  200 mg Oral BID   Followed by   Melene Muller ON 01/04/2024] amiodarone  200 mg Oral Daily   apixaban  5 mg Oral BID   vitamin C  500 mg Oral Daily   bisacodyl  10 mg Oral QHS   vitamin B-12  500 mcg Oral Daily   gabapentin  300 mg Oral QHS   hydrocortisone  1 Application Rectal BID   iron polysaccharides  150 mg Oral Daily   polyethylene glycol  17 g Oral BID   Vitamin D (Ergocalciferol)  50,000 Units Oral Q7 days   Continuous Infusions:   PRN Meds: bisacodyl, ipratropium-albuterol, ondansetron (ZOFRAN) IV, sodium chloride  Time  spent: 42 minutes  Author: Arnetha Courser. MD [facility] 12/29/2023 4:51 PM  To reach On-call, see care teams to locate the attending and reach out to them via [email or web address]. If 7PM-7AM, please contact night-coverage If you still have difficulty reaching the attending provider, please page the [facility] (Director on Call) for [facility] on amion for assistance.

## 2023-12-29 NOTE — Plan of Care (Signed)

## 2023-12-29 NOTE — Progress Notes (Signed)
Physical Therapy Treatment Patient Details Name: Todd Gonzales MRN: [id number] DOB: 1941/03/16 Today's Date: 12/29/2023   History of Present Illness BRAULIO KIEDROWSKI is an 83 y.o. male history of CHF, hypertension, hyperlipidemia, atrial fibrillation, presenting with shortness of breath and recent history of chest pain.  Per EMS when they arrived pt was satting 86% on room air and was placed on 4 L nasal cannula with improvement to his oxygen status.  MD assessment includes: acute respiratory failure with hypoxia and hypercapnia, acute on chronic heart failure with preserved ejection fraction, atrial fibrillation with rapid ventricular response, and hypokalemia.    PT Comments  Pt was pleasant and motivated to participate during the session and put forth good effort throughout. Pt required significant effort and use of BUEs to come to standing but no physical assist.  Pt was able to amb 60 feet this session but with slow cadence and short B step length with flexed trunk posture.  Pt required mod verbal cues for amb closer to the RW for general safety.  Pt with min instability during below balance training most notably with eyes closed with education provided on ensuring that the environment is well lit during standing/ambulation.  Pt is at an elevated risk for falls and will benefit from continued PT services upon discharge to safely address deficits listed in patient problem list for decreased caregiver assistance and eventual return to PLOF.      If plan is discharge home, recommend the following: A little help with walking and/or transfers;A little help with bathing/dressing/bathroom;Assistance with cooking/housework;Assist for transportation   Can travel by private vehicle     No  Equipment Recommendations  None recommended by PT    Recommendations for Other Services       Precautions / Restrictions Precautions Precautions: Fall Precaution Comments: watch O2; Denies any recent  falls Restrictions Weight Bearing Restrictions Per Provider Order: No     Mobility  Bed Mobility               General bed mobility comments: NT, in recliner pre/post session    Transfers Overall transfer level: Needs assistance Equipment used: Rolling walker (2 wheels) Transfers: Sit to/from Stand Sit to Stand: Contact guard assist           General transfer comment: Increased time and effort with use of BUEs to come to standing and to control descent    Ambulation/Gait Ambulation/Gait assistance: Contact guard assist Gait Distance (Feet): 60 Feet Assistive device: Rolling walker (2 wheels) Gait Pattern/deviations: Step-through pattern, Decreased step length - right, Decreased step length - left, Trunk flexed Gait velocity: decreased     General Gait Details: Short B step length with flexed trunk and mod cuing for amb closer to the RW for safety, mod lean on the RW for support   Stairs             Wheelchair Mobility     Tilt Bed    Modified Rankin (Stroke Patients Only)       Balance Overall balance assessment: Needs assistance   Sitting balance-Leahy Scale: Good     Standing balance support: Bilateral upper extremity supported, During functional activity, Reliant on assistive device for balance Standing balance-Leahy Scale: Fair                              Cognition Arousal: Alert Behavior During Therapy: WFL for tasks assessed/performed Overall Cognitive Status: Within Functional Limits  for tasks assessed                                          Exercises Other Exercises Other Exercises: Static standing balance training with feet apart and together with eyes open and closed and dynamic standing with reaching outside BOS with feet apart and together    General Comments        Pertinent Vitals/Pain Pain Assessment Pain Assessment: No/denies pain    Home Living                           Prior Function            PT Goals (current goals can now be found in the care plan section) Progress towards PT goals: Progressing toward goals    Frequency    Min 1X/week      PT Plan      Co-evaluation              AM-PAC PT "6 Clicks" Mobility   Outcome Measure  Help needed turning from your back to your side while in a flat bed without using bedrails?: None Help needed moving from lying on your back to sitting on the side of a flat bed without using bedrails?: A Little Help needed moving to and from a bed to a chair (including a wheelchair)?: A Little Help needed standing up from a chair using your arms (e.g., wheelchair or bedside chair)?: A Little Help needed to walk in hospital room?: A Little Help needed climbing 3-5 steps with a railing? : A Lot 6 Click Score: 18    End of Session Equipment Utilized During Treatment: Gait belt Activity Tolerance: Patient tolerated treatment well Patient left: in chair;with call bell/phone within reach;with chair alarm set Nurse Communication: Mobility status PT Visit Diagnosis: Other abnormalities of gait and mobility (R26.89);Muscle weakness (generalized) (M62.81);Difficulty in walking, not elsewhere classified (R26.2)     Time: 4098-1191 PT Time Calculation (min) (ACUTE ONLY): 25 min  Charges:    $Gait Training: 8-22 mins $Therapeutic Exercise: 8-22 mins PT General Charges $$ ACUTE PT VISIT: 1 Visit                    D. Scott Angelina Venard PT, DPT 12/29/23, 10:51 AM

## 2023-12-29 NOTE — Plan of Care (Signed)
Problem: Health Behavior/Discharge Planning: Goal: Ability to manage health-related needs will improve Outcome: Progressing   Problem: Clinical Measurements: Goal: Ability to maintain clinical measurements within normal limits will improve Outcome: Progressing

## 2023-12-29 NOTE — TOC Progression Note (Signed)
Transition of Care [facility]) - Progression Note    Patient Details  Name: Todd Gonzales MRN: [id number] Date of Birth: 1941/10/27  [facility]) CM/SW Contact  Truddie Hidden, RN Phone Number: 12/29/2023, 11:44 AM  Clinical Narrative:    8:15am Peer to peer was offered @ [phone], option 5.  Deadline is today at 12:00. MD notified by Jennette Banker  9:04amPer MD updated physical therapy notes have been requested by medical director for continued review.   11:40am Updated physical therapy notes uploaded to [employer] portal. RNCM called [phone] option 8. Notes confirmed as received by Natalia Leatherwood in preservice and Joselyn Glassman in appeals.           Expected Discharge Plan: Skilled Nursing Facility Barriers to Discharge: Continued Medical Work up  Expected Discharge Plan and Services     Post Acute Care Choice: Skilled Nursing Facility Living arrangements for the past 2 months: Independent Living Facility                                       Social Determinants of Health (SDOH) Interventions SDOH Screenings   Food Insecurity: No Food Insecurity (12/22/2023)  Housing: Low Risk  (12/22/2023)  Transportation Needs: No Transportation Needs (12/22/2023)  Utilities: Not At Risk (12/22/2023)  Alcohol Screen: Low Risk  (08/08/2023)  Depression (PHQ2-9): Low Risk  (08/08/2023)  Financial Resource Strain: Low Risk  (08/08/2023)  Physical Activity: Inactive (08/08/2023)  Social Connections: Socially Integrated (12/22/2023)  Stress: No Stress Concern Present (08/08/2023)  Tobacco Use: Low Risk  (12/20/2023)  Health Literacy: Adequate Health Literacy (08/08/2023)    Readmission Risk Interventions    04/16/2023   11:32 AM  Readmission Risk Prevention Plan  Post Dischage Appt Complete  Medication Screening Complete  Transportation Screening Complete

## 2023-12-30 DIAGNOSIS — I509 Heart failure, unspecified: Secondary | ICD-10-CM | POA: Diagnosis not present

## 2023-12-30 DIAGNOSIS — J9601 Acute respiratory failure with hypoxia: Secondary | ICD-10-CM | POA: Diagnosis not present

## 2023-12-30 DIAGNOSIS — I4891 Unspecified atrial fibrillation: Secondary | ICD-10-CM | POA: Diagnosis not present

## 2023-12-30 DIAGNOSIS — J9621 Acute and chronic respiratory failure with hypoxia: Secondary | ICD-10-CM | POA: Diagnosis not present

## 2023-12-30 MED ORDER — AMIODARONE HCL 200 MG PO TABS: 200.0000 mg | ORAL_TABLET | Freq: Every day | ORAL | 1 refills | Status: DC

## 2023-12-30 MED ORDER — VITAMIN D (ERGOCALCIFEROL) 1.25 MG (50000 UNIT) PO CAPS: 50000.0000 [IU] | ORAL_CAPSULE | ORAL | 0 refills | Status: DC

## 2023-12-30 MED ORDER — POLYSACCHARIDE IRON COMPLEX 150 MG PO CAPS: 150.0000 mg | ORAL_CAPSULE | Freq: Every day | ORAL | 1 refills | Status: DC

## 2023-12-30 MED ORDER — ASCORBIC ACID 500 MG PO TABS: 500.0000 mg | ORAL_TABLET | Freq: Every day | ORAL | 1 refills | Status: DC

## 2023-12-30 NOTE — Plan of Care (Signed)

## 2023-12-30 NOTE — Plan of Care (Signed)

## 2023-12-30 NOTE — Discharge Summary (Signed)
Physician Discharge Summary   Patient: Todd Gonzales MRN: [id number] DOB: [birth year]  Admit date:     12/20/2023  Discharge date: 12/30/23  Discharge Physician: Arnetha Courser   PCP: Joaquim Nam, MD   Recommendations at discharge:  Please obtain CBC and BMP on follow-up Follow-up with primary care provider Follow-up with cardiology  Discharge Diagnoses: Principal Problem:   Acute respiratory failure with hypoxia [facility]) Active Problems:   Acute on chronic respiratory failure with hypoxia and hypercapnia (HCC)   Acute on chronic congestive heart failure (HCC)   Atrial fibrillation with rapid ventricular response (HCC)   HTN (hypertension)   SOB (shortness of breath)   Coronary artery disease, non-occlusive   Hypoxia   Respiratory acidosis  Resolved Problems:   * No resolved hospital problems. *  Hospital Course: Todd Gonzales is a 83 y.o. male with medical history significant of morbid obesity, HFpEF, CAD, atrial fibrillation presenting with acute respiratory failure with hypoxia and hypercarbia, acute on chronic HFpEF.  Patient reports increased work of breathing over the past week or so.  Noted baseline HFpEF.  Reports intermittent compliance with Lasix.  Mild orthopnea PND as well as lower extremity swelling.  No chest pain.  No nausea or vomiting.  No focal hemiparesis or confusion.  Shortness of breath has become more pronounced.  Currently lives at assisted living facility.  Was redirected to the ER for worsening shortness of breath.  Noted to be satting at 82% on room air upon EMS arrival. Presented to ER afebrile, HR 140s-50s, BP stable. Initially in 80s on RA. Placed on BIPAP. VBG w/ hypercarbia. BNP 600s. Cr 1.2. T bili 1.6.   He was found to be in A-fib with RVR s/p DCCV and converted back to sinus rhythm on 1/17 and remained stable since then.  We did receive digoxin and Cardizem infusion, started on p.o. amiodarone but dose was de-escalated quickly due to borderline  bradycardia.  Home metoprolol was discontinued by cardiology and he is currently stable on amiodarone 200 mg daily.  He will continue with Eliquis.  His acute respiratory failure with hypoxia and hypercarbia has been resolved.  Likely secondary to acute on chronic HFpEF with RVR.  He received IV diuresis, now appears euvolemic and he will resume as needed Lasix on discharge and follow-up closely with cardiology for further recommendations.  He was found to have multiple deficiencies include iron, vitamin D and B12 and started on supplement which she will continue.  PCP can monitor and titrate as needed.  Our physical therapist evaluated him and recommended SNF before going back to his independent living but unfortunately insurance denied even with peer to peer.  Home health services was ordered and patient is being discharged back to his independent living.  He can work with [facility] [place] and might need to go on long-term care if desired.  Patient will continue on current medications and need to have a close follow-up with his providers for further assistance.   Consultants: Cardiology Procedures performed: DCCV and TEE Disposition: Home health Diet recommendation:  Discharge Diet Orders (From admission, onward)     Start     Ordered   12/30/23 0000  Diet - low sodium heart healthy        12/30/23 1049           Cardiac diet DISCHARGE MEDICATION: Allergies as of 12/30/2023       Reactions   Codeine Other (See Comments)   Unknown reaction   Lipitor [  atorvastatin] Other (See Comments)   Arthralgias    Bactrim [sulfamethoxazole-trimethoprim] Rash        Medication List     STOP taking these medications    metoprolol tartrate 25 MG tablet Commonly known as: LOPRESSOR   VITAMIN D-3 PO       TAKE these medications    acetaminophen 650 MG CR tablet Commonly known as: TYLENOL Take 1,300 mg by mouth 2 (two) times daily as needed for pain.   amiodarone 200 MG  tablet Commonly known as: Pacerone Take 1 tablet (200 mg total) by mouth daily.   ascorbic acid 500 MG tablet Commonly known as: VITAMIN C Take 1 tablet (500 mg total) by mouth daily. Start taking on: December 31, 2023   cyanocobalamin 1000 MCG/ML injection Commonly known as: VITAMIN B12 Inject 1 mL (1,000 mcg total) into the muscle every 30 (thirty) days.   diclofenac Sodium 1 % Gel Commonly known as: VOLTAREN Apply 1 Application topically every 6 (six) hours as needed (pain).   Eliquis 5 MG Tabs tablet Generic drug: apixaban TAKE 1 TABLET BY MOUTH TWICE A DAY   famotidine 20 MG tablet Commonly known as: Pepcid Take 1 tablet (20 mg total) by mouth 2 (two) times daily.   furosemide 20 MG tablet Commonly known as: LASIX Take 1 tablet (20 mg total) by mouth daily as needed for fluid.   gabapentin 300 MG capsule Commonly known as: NEURONTIN TAKE ONE CAPSULE BY MOUTH ONCE DAILY   hydrocortisone 2.5 % rectal cream Commonly known as: Proctosol HC Place 1 application. rectally 2 (two) times daily.   iron polysaccharides 150 MG capsule Commonly known as: NIFEREX Take 1 capsule (150 mg total) by mouth daily. Start taking on: December 31, 2023   mometasone 0.1 % cream Commonly known as: ELOCON Spot treat thicker areas on lower legs once to twice daily until improved. Avoid face, groin, axilla.   ondansetron 4 MG tablet Commonly known as: ZOFRAN TAKE ONE TABLET (4 MG TOTAL) BY MOUTH EVERY EIGHT (EIGHT) HOURS AS NEEDED FOR NAUSEA OR VOMITING.   OVER THE COUNTER MEDICATION Place 1-2 sprays into the nose daily as needed (allergies, nasal congestion). Unknown nasal spray.   polyethylene glycol 17 g packet Commonly known as: MIRALAX / GLYCOLAX Take 17 g by mouth daily as needed for mild constipation.   SALONPAS EX Apply 1 patch topically daily as needed (pain).   tamsulosin 0.4 MG Caps capsule Commonly known as: FLOMAX TAKE TWO CAPSULE BY MOUTH DAILY   Vitamin D  (Ergocalciferol) 1.25 MG (50000 UNIT) Caps capsule Commonly known as: DRISDOL Take 1 capsule (50,000 Units total) by mouth every 7 (seven) days. Start taking on: January 06, 2024        Contact information for follow-up providers     Vida Rigger, MD Follow up in 1 week(s).   Specialty: Pulmonary Disease Why: for PFTs and sleep study for OSA Contact information: [address] [place] [address] [phone]         Antonieta Iba, MD Follow up on 01/01/2024.   Specialty: Cardiology Why: Patient can see Dr. Herbie Baltimore on 2/8 at 10:00am Contact information: [address] [place] [address] [phone]         Joaquim Nam, MD Follow up in 1 week(s).   Specialty: Family Medicine Contact information: [address] [place] [address] [phone]              Contact information  for after-discharge care     Destination     [facility] .   Service: Skilled Nursing Contact information: [address] [place] [address] [phone]                    Discharge Exam: Ceasar Mons Weights   12/20/23 1110 12/26/23 0600 12/27/23 0847  Weight: 108.9 kg 104.7 kg 104.5 kg   General.  Morbidly obese gentleman, in no acute distress. Pulmonary.  Lungs clear bilaterally, normal respiratory effort. CV.  Regular rate and rhythm, no JVD, rub or murmur. Abdomen.  Soft, nontender, nondistended, BS positive. CNS.  Alert and oriented .  No focal neurologic deficit. Extremities.  No edema, no cyanosis, pulses intact and symmetrical. Psychiatry.  Judgment and insight appears normal.   Condition at discharge: stable  The results of significant diagnostics from this hospitalization (including imaging, microbiology, ancillary and laboratory) are listed below for reference.   Imaging Studies: ECHO TEE Result Date: 12/22/2023    TRANSESOPHOGEAL ECHO REPORT   Patient Name:   OMEGA SLAGER  Date of Exam: 12/22/2023 Medical Rec #:  [id number]     Height:       70.0 in Accession #:    [id number]    Weight:       240.0 lb Date of Birth:  18-Sep-1941     BSA:          2.255 m Patient Age:    82 years      BP:           133/87 mmHg Patient Gender: M             HR:           137 bpm. Exam Location:  [facility] Procedure: Transesophageal Echo, Cardiac Doppler and Color Doppler Indications:     Atrial Fibrillation I48.91  History:         Patient has prior history of Echocardiogram examinations, most                  recent 12/21/2023. Risk Factors:Hypertension and Dyslipidemia.                  Paroxysmal Afib.  Sonographer:     Cristela Blue Referring Phys:  [id number] Debbe Odea Diagnosing Phys: Debbe Odea MD PROCEDURE: The transesophogeal probe was passed without difficulty through the esophogus of the patient. Sedation performed by different physician. The patient developed no complications during the procedure. A successful direct current cardioversion was  performed at 200 joules with 1 attempt.  IMPRESSIONS  1. Left ventricular ejection fraction, by estimation, is 60 to 65%. The left ventricle has normal function.  2. Right ventricular systolic function is normal. The right ventricular size is normal.  3. Left atrial size was mildly dilated. No left atrial/left atrial appendage thrombus was detected.  4. The mitral valve is normal in structure. Mild to moderate mitral valve regurgitation.  5. The aortic valve is tricuspid. Aortic valve regurgitation is mild. FINDINGS  Left Ventricle: Left ventricular ejection fraction, by estimation, is 60 to 65%. The left ventricle has normal function. The left ventricular internal cavity size was normal in size. Right Ventricle: The right ventricular size is normal. No increase in right ventricular wall thickness. Right ventricular systolic function is normal. Left Atrium: Left atrial size was mildly dilated. No left atrial/left atrial appendage thrombus was detected.  Right Atrium: Right atrial size was normal in size. Pericardium: There is no  evidence of pericardial effusion. Mitral Valve: The mitral valve is normal in structure. Mild to moderate mitral valve regurgitation. Tricuspid Valve: The tricuspid valve is normal in structure. Tricuspid valve regurgitation is mild. Aortic Valve: The aortic valve is tricuspid. Aortic valve regurgitation is mild. Pulmonic Valve: The pulmonic valve was normal in structure. Pulmonic valve regurgitation is mild. Aorta: The aortic root is normal in size and structure. IAS/Shunts: No atrial level shunt detected by color flow Doppler. Debbe Odea MD Electronically signed by Debbe Odea MD Signature Date/Time: 12/22/2023/1:33:28 PM    Final    ECHOCARDIOGRAM COMPLETE Result Date: 12/21/2023    ECHOCARDIOGRAM REPORT   Patient Name:   BEE HAMMERSCHMIDT Date of Exam: 12/21/2023 Medical Rec #:  [id number]     Height:       70.0 in Accession #:    [id number]    Weight:       240.0 lb Date of Birth:  28-Oct-1941     BSA:          2.255 m Patient Age:    82 years      BP:           126/88 mmHg Patient Gender: M             HR:           99 bpm. Exam Location:  [facility] Procedure: 2D Echo, Cardiac Doppler and Color Doppler Indications:     Atrial Fibrillaiton  History:         Patient has prior history of Echocardiogram examinations, most                  recent 04/15/2023. CHF, CAD, Arrythmias:Atrial Fibrillation,                  Atrial Flutter and PVC, Signs/Symptoms:Fatigue and Shortness of                  Breath; Risk Factors:Hypertension and Dyslipidemia.  Sonographer:     Mikki Harbor Referring Phys:  [id number] [place] L CAREY Diagnosing Phys: Julien Nordmann MD  Sonographer Comments: Technically difficult study due to poor echo windows and patient is obese. IMPRESSIONS  1. Left ventricular ejection fraction, by estimation, is 60 to 65%. The left ventricle has normal function. The left ventricle has no regional wall motion abnormalities. There  is mild left ventricular hypertrophy. Left ventricular diastolic parameters are indeterminate.  2. Right ventricular systolic function is normal. The right ventricular size is normal. There is normal pulmonary artery systolic pressure. The estimated right ventricular systolic pressure is 29.9 mmHg.  3. The mitral valve is normal in structure. No evidence of mitral valve regurgitation. No evidence of mitral stenosis. Moderate mitral annular calcification.  4. The aortic valve is normal in structure. Aortic valve regurgitation is not visualized. No aortic stenosis is present.  5. The inferior vena cava is normal in size with greater than 50% respiratory variability, suggesting right atrial pressure of 3 mmHg. FINDINGS  Left Ventricle: Left ventricular ejection fraction, by estimation, is 60 to 65%. The left ventricle has normal function. The left ventricle has no regional wall motion abnormalities. The left ventricular internal cavity size was normal in size. There is  mild left ventricular hypertrophy. Left ventricular diastolic parameters are indeterminate. Right Ventricle: The right ventricular size is normal. No increase in right ventricular wall thickness. Right ventricular systolic function is normal. There is normal pulmonary artery systolic pressure. The tricuspid regurgitant velocity is 2.34 m/s, and  with an assumed right atrial pressure of 8 mmHg, the estimated right ventricular systolic pressure is 29.9 mmHg. Left Atrium: Left atrial size was normal in size. Right Atrium: Right atrial size was normal in size. Pericardium: There is no evidence of pericardial effusion. Mitral Valve: The mitral valve is normal in structure. Moderate mitral annular calcification. No evidence of mitral valve regurgitation. No evidence of mitral valve stenosis. MV peak gradient, 6.1 mmHg. The mean mitral valve gradient is 2.0 mmHg. Tricuspid Valve: The tricuspid valve is normal in structure. Tricuspid valve regurgitation is not  demonstrated. No evidence of tricuspid stenosis. Aortic Valve: The aortic valve is normal in structure. Aortic valve regurgitation is not visualized. No aortic stenosis is present. Aortic valve mean gradient measures 7.0 mmHg. Aortic valve peak gradient measures 13.9 mmHg. Aortic valve area, by VTI measures 1.55 cm. Pulmonic Valve: The pulmonic valve was normal in structure. Pulmonic valve regurgitation is not visualized. No evidence of pulmonic stenosis. Aorta: The aortic root is normal in size and structure. Venous: The inferior vena cava is normal in size with greater than 50% respiratory variability, suggesting right atrial pressure of 3 mmHg. IAS/Shunts: No atrial level shunt detected by color flow Doppler.  LEFT VENTRICLE PLAX 2D LVIDd:         4.10 cm LVIDs:         2.70 cm LV PW:         1.30 cm LV IVS:        1.20 cm LVOT diam:     2.00 cm LV SV:         50 LV SV Index:   22 LVOT Area:     3.14 cm  RIGHT VENTRICLE RV Basal diam:  3.57 cm RV Mid diam:    3.40 cm LEFT ATRIUM             Index        RIGHT ATRIUM           Index LA diam:        4.10 cm 1.82 cm/m   RA Area:     21.30 cm LA Vol (A2C):   77.2 ml 34.23 ml/m  RA Volume:   64.50 ml  28.60 ml/m LA Vol (A4C):   36.9 ml 16.36 ml/m LA Biplane Vol: 56.4 ml 25.01 ml/m  AORTIC VALVE                     PULMONIC VALVE AV Area (Vmax):    1.66 cm      PV Vmax:       0.89 m/s AV Area (Vmean):   1.44 cm      PV Peak grad:  3.1 mmHg AV Area (VTI):     1.55 cm AV Vmax:           186.67 cm/s AV Vmean:          117.667 cm/s AV VTI:            0.322 m AV Peak Grad:      13.9 mmHg AV Mean Grad:      7.0 mmHg LVOT Vmax:         98.75 cm/s LVOT Vmean:        53.750 cm/s LVOT VTI:          0.158 m LVOT/AV VTI ratio: 0.49  AORTA Ao Root diam: 3.20 cm MITRAL VALVE  TRICUSPID VALVE MV Area (PHT): 4.26 cm    TR Peak grad:   21.9 mmHg MV Area VTI:   1.86 cm    TR Vmax:        234.00 cm/s MV Peak grad:  6.1 mmHg MV Mean grad:  2.0 mmHg    SHUNTS MV  Vmax:       1.23 m/s    Systemic VTI:  0.16 m MV Vmean:      68.6 cm/s   Systemic Diam: 2.00 cm MV Decel Time: 178 msec MV E velocity: 92.80 cm/s Julien Nordmann MD Electronically signed by Julien Nordmann MD Signature Date/Time: 12/21/2023/3:19:37 PM    Final    DG Chest 2 View Result Date: 12/20/2023 CLINICAL DATA:  Shortness of breath. EXAM: CHEST - 2 VIEW COMPARISON:  July 28, 2023. FINDINGS: Stable cardiomediastinal silhouette. Hypoinflation of the lungs is noted with minimal bibasilar subsegmental atelectasis. Bony thorax is unremarkable. IMPRESSION: Hypoinflation of the lungs with minimal bibasilar subsegmental atelectasis. Electronically Signed   By: Lupita Raider M.D.   On: 12/20/2023 13:55    Microbiology: Results for orders placed or performed during the hospital encounter of 12/20/23  Resp panel by RT-PCR (RSV, Flu A&B, Covid) Anterior Nasal Swab     Status: None   Collection Time: 12/20/23 11:22 AM   Specimen: Anterior Nasal Swab  Result Value Ref Range Status   SARS Coronavirus 2 by RT PCR NEGATIVE NEGATIVE Final    Comment: (NOTE) SARS-CoV-2 target nucleic acids are NOT DETECTED.  The SARS-CoV-2 RNA is generally detectable in upper respiratory specimens during the acute phase of infection. The lowest concentration of SARS-CoV-2 viral copies this assay can detect is 138 copies/mL. A negative result does not preclude SARS-Cov-2 infection and should not be used as the sole basis for treatment or other patient management decisions. A negative result may occur with  improper specimen collection/handling, submission of specimen other than nasopharyngeal swab, presence of viral mutation(s) within the areas targeted by this assay, and inadequate number of viral copies(<138 copies/mL). A negative result must be combined with clinical observations, patient history, and epidemiological information. The expected result is Negative.  Fact Sheet for Patients:   BloggerCourse.com  Fact Sheet for Healthcare Providers:  SeriousBroker.it  This test is no t yet approved or cleared by the [place] FDA and  has been authorized for detection and/or diagnosis of SARS-CoV-2 by FDA under an Emergency Use Authorization (EUA). This EUA will remain  in effect (meaning this test can be used) for the duration of the COVID-19 declaration under Section 564(b)(1) of the Act, 21 U.S.C.section 360bbb-3(b)(1), unless the authorization is terminated  or revoked sooner.       Influenza A by PCR NEGATIVE NEGATIVE Final   Influenza B by PCR NEGATIVE NEGATIVE Final    Comment: (NOTE) The Xpert Xpress SARS-CoV-2/FLU/RSV plus assay is intended as an aid in the diagnosis of influenza from Nasopharyngeal swab specimens and should not be used as a sole basis for treatment. Nasal washings and aspirates are unacceptable for Xpert Xpress SARS-CoV-2/FLU/RSV testing.  Fact Sheet for Patients: BloggerCourse.com  Fact Sheet for Healthcare Providers: SeriousBroker.it  This test is not yet approved or cleared by the [place] FDA and has been authorized for detection and/or diagnosis of SARS-CoV-2 by FDA under an Emergency Use Authorization (EUA). This EUA will remain in effect (meaning this test can be used) for the duration of the COVID-19 declaration under Section 564(b)(1) of the Act, 21 U.S.C. section  360bbb-3(b)(1), unless the authorization is terminated or revoked.     Resp Syncytial Virus by PCR NEGATIVE NEGATIVE Final    Comment: (NOTE) Fact Sheet for Patients: BloggerCourse.com  Fact Sheet for Healthcare Providers: SeriousBroker.it  This test is not yet approved or cleared by the [place] FDA and has been authorized for detection and/or diagnosis of SARS-CoV-2 by FDA under an Emergency Use  Authorization (EUA). This EUA will remain in effect (meaning this test can be used) for the duration of the COVID-19 declaration under Section 564(b)(1) of the Act, 21 U.S.C. section 360bbb-3(b)(1), unless the authorization is terminated or revoked.  Performed at [facility], [address] [place], [place] [address]     Labs: CBC: Recent Labs  Lab 12/24/23 [phone] 12/25/23 0631 12/26/23 0540  WBC 6.9 7.1 7.9  HGB 13.0 13.2 12.9*  HCT 40.3 41.3 39.9  MCV 99.3 99.3 99.8  PLT 181 199 201   Basic Metabolic Panel: Recent Labs  Lab 12/24/23 0307 12/25/23 0631 12/26/23 0540 12/27/23 0559  NA 142 143 143 143  K 3.4* 3.3* 3.3* 4.4  CL 96* 100 101 100  CO2 33* 34* 33* 33*  GLUCOSE 105* 105* 104* 98  BUN 21 20 19 20   CREATININE 0.83 0.79 0.96 0.92  CALCIUM 8.2* 8.3* 8.2* 8.3*  MG 2.1  --   --   --   PHOS 3.3  --   --   --    Liver Function Tests: No results for input(s): "AST", "ALT", "ALKPHOS", "BILITOT", "PROT", "ALBUMIN" in the last 168 hours. CBG: No results for input(s): "GLUCAP" in the last 168 hours.  Discharge time spent: greater than 30 minutes.  This record has been created using Conservation officer, historic buildings. Errors have been sought and corrected,but may not always be located. Such creation errors do not reflect on the standard of care.   Signed: Arnetha Courser, MD [facility] 12/30/2023

## 2023-12-30 NOTE — TOC Progression Note (Addendum)
Transition of Care [facility]) - Progression Note    Patient Details  Name: Todd Gonzales MRN: [id number] Date of Birth: 01/06/1941  [facility] CM/SW Contact  Rodney Langton, RN Phone Number: 12/30/2023, 10:30 AM  Clinical Narrative:     Barrie Dunker to follow up on auth  for [facility], notified that patient was denied, decision made on 1/24 at 154pm.  MD made aware as well as wife.  Wife state she is unable to care for him at home and wishes to appeal.  She will let the bedside nurse know when she arrives and this RNCM will present appeal options/process.   Update @ 1245: Wife aware that patient may need to discharge back to ILF with home health.  HH explained, advised patient will not have daily assistance in the home, therapy will only visit a couple days a week.  She does not feel she can safely take patient home and adequately care for him and wishes to proceed with appeal for the SNF denial.  Paperwork faxed to [facility], initiating an appeal.     Expected Discharge Plan: Skilled Nursing Facility Barriers to Discharge: Continued Medical Work up  Expected Discharge Plan and Services     Post Acute Care Choice: Skilled Nursing Facility Living arrangements for the past 2 months: Independent Living Facility                                       Social Determinants of Health (SDOH) Interventions SDOH Screenings   Food Insecurity: No Food Insecurity (12/22/2023)  Housing: Low Risk  (12/22/2023)  Transportation Needs: No Transportation Needs (12/22/2023)  Utilities: Not At Risk (12/22/2023)  Alcohol Screen: Low Risk  (08/08/2023)  Depression (PHQ2-9): Low Risk  (08/08/2023)  Financial Resource Strain: Low Risk  (08/08/2023)  Physical Activity: Inactive (08/08/2023)  Social Connections: Socially Integrated (12/22/2023)  Stress: No Stress Concern Present (08/08/2023)  Tobacco Use: Low Risk  (12/20/2023)  Health Literacy: Adequate Health Literacy (08/08/2023)    Readmission Risk  Interventions    04/16/2023   11:32 AM  Readmission Risk Prevention Plan  Post Dischage Appt Complete  Medication Screening Complete  Transportation Screening Complete

## 2023-12-31 DIAGNOSIS — J9621 Acute and chronic respiratory failure with hypoxia: Secondary | ICD-10-CM | POA: Diagnosis not present

## 2023-12-31 DIAGNOSIS — I509 Heart failure, unspecified: Secondary | ICD-10-CM | POA: Diagnosis not present

## 2023-12-31 DIAGNOSIS — J9601 Acute respiratory failure with hypoxia: Secondary | ICD-10-CM | POA: Diagnosis not present

## 2023-12-31 DIAGNOSIS — I4891 Unspecified atrial fibrillation: Secondary | ICD-10-CM | POA: Diagnosis not present

## 2023-12-31 NOTE — Plan of Care (Signed)

## 2023-12-31 NOTE — Progress Notes (Signed)
[facility] Progress Note  Patient: Todd Gonzales    Todd Gonzales:[id number]  DOA: 12/20/2023     Date of Service: the patient was seen and examined on 12/31/2023  Chief Complaint  Patient presents with   Shortness of Breath   Brief hospital course: ARIA PICKRELL is a 83 y.o. male with medical history significant of morbid obesity, HFpEF, CAD, atrial fibrillation presenting with acute respiratory failure with hypoxia and hypercarbia, acute on chronic HFpEF.  Patient reports increased work of breathing over the past week or so.  Noted baseline HFpEF.  Reports intermittent compliance with Lasix.  Mild orthopnea PND as well as lower extremity swelling.  No chest pain.  No nausea or vomiting.  No focal hemiparesis or confusion.  Shortness of breath has become more pronounced.  Currently lives at assisted living facility.  Was redirected to the ER for worsening shortness of breath.  Noted to be satting at 82% on room air upon EMS arrival. Presented to ER afebrile, HR 140s-50s, BP stable. Initially in 80s on RA. Placed on BIPAP. VBG w/ hypercarbia. BNP 600s. Cr 1.2. T bili 1.6.   1/22: S/p DCCV and now in sinus rhythm.  Cardiology signed off on 1/18.  Discontinuing metoprolol due to borderline bradycardia.  Patient is also on amiodarone. Currently medically stable and awaiting insurance authorization for SNF.  1/23: Remained hemodynamically stable, still awaiting insurance authorization.  1/24: Still pending final decision from insurance, peer to peer was done and updated PT notes were given as requested.  1/26: Patient was discharged back to his independent facility with home health services yesterday when insurance declined SNF even after peer to peer.  Wife is very nervous and she appealed that decision.   Assessment and Plan:  # Acute respiratory failure with hypoxia and hypercapnia, Resolved  Decompensated resp failure requiring BIPAP in setting of acute on chronic HFpEF  Noted decompensated  resp acidosis on presentation  No noted prior hx/o tobacco abuse or obstructive lung disease  Suspect element of OHS/sleep apnea-noted large body habitus  D dimer  0.31 negative,Cont w/ BIPAP prn S/p Brovana nebulizer BID, d/c'd on 1/20, no more need Continue DuoNeb every 6 hourly as needed Patient needs sleep study and pulmonary follow-up as an outpatient for PFTs  # Acute on chronic heart failure with preserved ejection fraction (HFpEF) 2D ECHO 04/2023 w/ EF 60-65%  hypercarbic resp failure on presentation w/ volume overload  Noted baseline interimittent use of lasix at facility  BNP 625- well above baseline, LE edema resolved   CXR w/ cardiomegaly and vascular congestion preliminarily  S/p IV lasix  Cardiology consulted   # Atrial fibrillation with rapid ventricular response, Resolved s/p cardioversion done on 1/17 Initial HR into 150s, Improved to 50s s/p IV metoprolol x 1  continue eliquis  S/p digoxin 0.5 mg p.o. daily and S/p  Cardizem IV infusion Monitor HR w/ BB use  Cardiology consulted, sp TEE and cardioversion 1/17 Started amiodarone 400 mg p.o. twice daily for 7 days followed by 200 mg p.o. twice daily for 7 days followed by 20 mg p.o. daily as per cardiology 1/18 started metoprolol 25 mg p.o. twice daily as per cardio 1/21 bradycardia, decreased Lopressor 12.5 mg p.o. twice daily 1/22: Remained borderline bradycardic, discontinuing Lopressor  # Hypokalemia, potassium repleted.  # Coronary artery disease, non-occlusive Baseline CAD  No active chest pain  Monitor    # HTN (hypertension) Monitor BP and titrate home regimen  Patient became hypotensive, received digoxin and  Cardizem IV infusion as above 1/18 started metoprolol 25 mg p.o. twice daily as per cardio  # Iron-deficiency, transfer sat 8%, started oral iron supplement. # Vitamin D level 31.7, at lower end, started vitamin D 50k units p.o. weekly. # Vitamin B12 level 378, goal <400, started B12 oral  supplement.   Obesity class I Body mass index is 33.06 kg/m.  Interventions:  Diet: Heart healthy diet, fluid restriction 1.5 L/day DVT Prophylaxis: Therapeutic Anticoagulation with Eliquis    Advance goals of care discussion: Full code  Family Communication:   Disposition:  Pt is from ILF (independent living [facility] ), admitted with Resp failure and A.fib, s/p cardioversion done on 1/17.stable to discharge  Insurance declined SNF even with peer to peer. Wife appealed  Subjective: Patient with no new concern today.  Awaiting appeal response  Physical Exam:  General.  Obese elderly man, in no acute distress. Pulmonary.  Lungs clear bilaterally, normal respiratory effort. CV.  Regular rate and rhythm, no JVD, rub or murmur. Abdomen.  Soft, nontender, nondistended, BS positive. CNS.  Alert and oriented .  No focal neurologic deficit. Extremities.  No edema, no cyanosis, pulses intact and symmetrical.    Vitals:   12/30/23 2100 12/30/23 2245 12/31/23 0511 12/31/23 0935  BP: (!) 119/55 (!) 148/67 (!) 142/68 113/63  Pulse: (!) 54 (!) 52 (!) 56 (!) 57  Resp: 16 17 17 17   Temp: 98.3 F (36.8 C) 97.9 F (36.6 C) 98 F (36.7 C) 98 F (36.7 C)  TempSrc: Oral Oral Oral   SpO2: 96% 94% 93% 94%  Weight:      Height:        Intake/Output Summary (Last 24 hours) at 12/31/2023 1604 Last data filed at 12/31/2023 0435 Gross per 24 hour  Intake 240 ml  Output 700 ml  Net -460 ml   Filed Weights   12/20/23 1110 12/26/23 0600 12/27/23 0847  Weight: 108.9 kg 104.7 kg 104.5 kg    Data Reviewed: Prior data reviewed  CBC: Recent Labs  Lab 12/25/23 0631 12/26/23 0540  WBC 7.1 7.9  HGB 13.2 12.9*  HCT 41.3 39.9  MCV 99.3 99.8  PLT 199 201   Basic Metabolic Panel: Recent Labs  Lab 12/25/23 0631 12/26/23 0540 12/27/23 0559  NA 143 143 143  K 3.3* 3.3* 4.4  CL 100 101 100  CO2 34* 33* 33*  GLUCOSE 105* 104* 98  BUN 20 19 20   CREATININE 0.79 0.96 0.92  CALCIUM  8.3* 8.2* 8.3*    Studies: No results found.   Scheduled Meds:  amiodarone  200 mg Oral BID   Followed by   Melene Muller ON 01/04/2024] amiodarone  200 mg Oral Daily   apixaban  5 mg Oral BID   vitamin C  500 mg Oral Daily   bisacodyl  10 mg Oral QHS   vitamin B-12  500 mcg Oral Daily   gabapentin  300 mg Oral QHS   hydrocortisone  1 Application Rectal BID   iron polysaccharides  150 mg Oral Daily   polyethylene glycol  17 g Oral BID   Vitamin D (Ergocalciferol)  50,000 Units Oral Q7 days   Continuous Infusions:   PRN Meds: bisacodyl, ipratropium-albuterol, ondansetron (ZOFRAN) IV, sodium chloride  Time spent: 40 minutes  Author: Arnetha Courser. MD [facility] 12/31/2023 4:04 PM  To reach On-call, see care teams to locate the attending and reach out to them via [email or web address]. If 7PM-7AM, please contact night-coverage If you still have  difficulty reaching the attending provider, please page the [facility] (Director on Call) for [facility] on amion for assistance.

## 2024-01-01 DIAGNOSIS — J9621 Acute and chronic respiratory failure with hypoxia: Secondary | ICD-10-CM | POA: Diagnosis not present

## 2024-01-01 DIAGNOSIS — I509 Heart failure, unspecified: Secondary | ICD-10-CM | POA: Diagnosis not present

## 2024-01-01 DIAGNOSIS — I4891 Unspecified atrial fibrillation: Secondary | ICD-10-CM | POA: Diagnosis not present

## 2024-01-01 DIAGNOSIS — J9601 Acute respiratory failure with hypoxia: Secondary | ICD-10-CM | POA: Diagnosis not present

## 2024-01-01 DIAGNOSIS — L89320 Pressure ulcer of left buttock, unstageable: Secondary | ICD-10-CM

## 2024-01-01 NOTE — Plan of Care (Signed)
Problem: Education: Goal: Knowledge of General Education information will improve Description: Including pain rating scale, medication(s)/side effects and non-pharmacologic comfort measures Outcome: Progressing   Problem: Clinical Measurements: Goal: Diagnostic test results will improve Outcome: Progressing   Problem: Activity: Goal: Risk for activity intolerance will decrease Outcome: Progressing   Problem: Elimination: Goal: Will not experience complications related to bowel motility Outcome: Progressing

## 2024-01-01 NOTE — TOC Progression Note (Signed)
Transition of Care [facility]) - Progression Note    Patient Details  Name: Todd Gonzales MRN: [id number] Date of Birth: 1941-03-01  [facility] CM/SW Contact  Truddie Hidden, RN Phone Number: 01/01/2024, 10:21 AM  Clinical Narrative:    Spoke with Jazz from [facility]. Appeal is still in pending process.     Expected Discharge Plan: Skilled Nursing Facility Barriers to Discharge: Continued Medical Work up  Expected Discharge Plan and Services     Post Acute Care Choice: Skilled Nursing Facility Living arrangements for the past 2 months: Independent Living Facility Expected Discharge Date: 12/30/23                                     Social Determinants of Health (SDOH) Interventions SDOH Screenings   Food Insecurity: No Food Insecurity (12/22/2023)  Housing: Low Risk  (12/22/2023)  Transportation Needs: No Transportation Needs (12/22/2023)  Utilities: Not At Risk (12/22/2023)  Alcohol Screen: Low Risk  (08/08/2023)  Depression (PHQ2-9): Low Risk  (08/08/2023)  Financial Resource Strain: Low Risk  (08/08/2023)  Physical Activity: Inactive (08/08/2023)  Social Connections: Socially Integrated (12/22/2023)  Stress: No Stress Concern Present (08/08/2023)  Tobacco Use: Low Risk  (12/20/2023)  Health Literacy: Adequate Health Literacy (08/08/2023)    Readmission Risk Interventions    04/16/2023   11:32 AM  Readmission Risk Prevention Plan  Post Dischage Appt Complete  Medication Screening Complete  Transportation Screening Complete

## 2024-01-01 NOTE — Progress Notes (Signed)
Physical Therapy Treatment Patient Details Name: Todd Gonzales MRN: [id number] DOB: 05-Jul-1941 Today's Date: 01/01/2024   History of Present Illness LOCKLAN CANOY is an 83 y.o. male history of CHF, hypertension, hyperlipidemia, atrial fibrillation, presenting with shortness of breath and recent history of chest pain.  Per EMS when they arrived pt was satting 86% on room air and was placed on 4 L nasal cannula with improvement to his oxygen status.  MD assessment includes: acute respiratory failure with hypoxia and hypercapnia, acute on chronic heart failure with preserved ejection fraction, atrial fibrillation with rapid ventricular response, and hypokalemia.    PT Comments  Pt was pleasant and motivated to participate during the session and put forth good effort throughout. Pt required significant effort and use of the bed rail to come to sitting at the EOB but required no physical assist.  Pt required the EOB to be elevated moderately in order to come to standing but presented with good stability upon initial stand.  Pt was able to ambulate with slow cadence and flexed trunk posture 2 x 60' with therapeutic rest between bouts and with SpO2 and HR WNL on room air.  Pt required cuing during amb for general safety including proper grip on the RW and for amb closer to the RW with decreased WB through his UEs.  Pt will benefit from continued PT services upon discharge to safely address deficits listed in patient problem list for decreased caregiver assistance and eventual return to PLOF.      If plan is discharge home, recommend the following: A little help with walking and/or transfers;A little help with bathing/dressing/bathroom;Assistance with cooking/housework;Assist for transportation   Can travel by private vehicle     No  Equipment Recommendations  None recommended by PT    Recommendations for Other Services       Precautions / Restrictions Precautions Precautions: Fall Precaution  Comments: watch O2 Restrictions Weight Bearing Restrictions Per Provider Order: No     Mobility  Bed Mobility Overal bed mobility: Modified Independent Bed Mobility: Supine to Sit           General bed mobility comments: Extra time, effort, and use of bed rail but no physical assistance needed    Transfers Overall transfer level: Needs assistance Equipment used: Rolling walker (2 wheels) Transfers: Sit to/from Stand Sit to Stand: Contact guard assist, From elevated surface           General transfer comment: Increased time and effort with use of RUE to come to standing and to control descent; required the EOB to be moderately elevated to come to standing; transfer training provided from multiple height surfaces    Ambulation/Gait Ambulation/Gait assistance: Contact guard assist Gait Distance (Feet): 60 Feet x 2 Assistive device: Rolling walker (2 wheels) Gait Pattern/deviations: Step-through pattern, Decreased step length - right, Decreased step length - left, Trunk flexed Gait velocity: decreased     General Gait Details: Short B step length with flexed trunk and mod cuing for amb closer to the RW and for decreased UE weight bearing on the RW   Stairs             Wheelchair Mobility     Tilt Bed    Modified Rankin (Stroke Patients Only)       Balance Overall balance assessment: Needs assistance Sitting-balance support: Feet supported Sitting balance-Leahy Scale: Good     Standing balance support: Bilateral upper extremity supported, During functional activity, Reliant on assistive device for balance  Standing balance-Leahy Scale: Fair                              Cognition Arousal: Alert Behavior During Therapy: WFL for tasks assessed/performed Overall Cognitive Status: Within Functional Limits for tasks assessed                                          Exercises      General Comments        Pertinent  Vitals/Pain Pain Assessment Pain Assessment: No/denies pain    Home Living                          Prior Function            PT Goals (current goals can now be found in the care plan section) Progress towards PT goals: Progressing toward goals    Frequency    Min 1X/week      PT Plan      Co-evaluation              AM-PAC PT "6 Clicks" Mobility   Outcome Measure  Help needed turning from your back to your side while in a flat bed without using bedrails?: None Help needed moving from lying on your back to sitting on the side of a flat bed without using bedrails?: A Little Help needed moving to and from a bed to a chair (including a wheelchair)?: A Little Help needed standing up from a chair using your arms (e.g., wheelchair or bedside chair)?: A Little Help needed to walk in hospital room?: A Little Help needed climbing 3-5 steps with a railing? : A Lot 6 Click Score: 18    End of Session Equipment Utilized During Treatment: Gait belt Activity Tolerance: Patient tolerated treatment well Patient left: in chair;with call bell/phone within reach;with chair alarm set;with nursing/sitter in room Nurse Communication: Mobility status PT Visit Diagnosis: Other abnormalities of gait and mobility (R26.89);Muscle weakness (generalized) (M62.81);Difficulty in walking, not elsewhere classified (R26.2)     Time: 1610-9604 PT Time Calculation (min) (ACUTE ONLY): 27 min  Charges:    $Gait Training: 23-37 mins PT General Charges $$ ACUTE PT VISIT: 1 Visit                     D. Scott Jaran Sainz PT, DPT 01/01/24, 4:21 PM

## 2024-01-01 NOTE — Progress Notes (Signed)
[facility] Progress Note  Patient: Todd Gonzales    MWN:[id number]  DOA: 12/20/2023     Date of Service: the patient was seen and examined on 01/01/2024  Chief Complaint  Patient presents with   Shortness of Breath   Brief hospital course: CAI ANFINSON is a 83 y.o. male with medical history significant of morbid obesity, HFpEF, CAD, atrial fibrillation presenting with acute respiratory failure with hypoxia and hypercarbia, acute on chronic HFpEF.  Patient reports increased work of breathing over the past week or so.  Noted baseline HFpEF.  Reports intermittent compliance with Lasix.  Mild orthopnea PND as well as lower extremity swelling.  No chest pain.  No nausea or vomiting.  No focal hemiparesis or confusion.  Shortness of breath has become more pronounced.  Currently lives at assisted living facility.  Was redirected to the ER for worsening shortness of breath.  Noted to be satting at 82% on room air upon EMS arrival. Presented to ER afebrile, HR 140s-50s, BP stable. Initially in 80s on RA. Placed on BIPAP. VBG w/ hypercarbia. BNP 600s. Cr 1.2. T bili 1.6.   1/22: S/p DCCV and now in sinus rhythm.  Cardiology signed off on 1/18.  Discontinuing metoprolol due to borderline bradycardia.  Patient is also on amiodarone. Currently medically stable and awaiting insurance authorization for SNF.  1/23: Remained hemodynamically stable, still awaiting insurance authorization.  1/24: Still pending final decision from insurance, peer to peer was done and updated PT notes were given as requested.  1/26: Patient was discharged back to his independent facility with home health services yesterday when insurance declined SNF even after peer to peer.  Wife is very nervous and she appealed that decision.  1/27: Remained stable.  Appeal is still pending   Assessment and Plan:  # Acute respiratory failure with hypoxia and hypercapnia, Resolved  Decompensated resp failure requiring BIPAP in setting  of acute on chronic HFpEF  Noted decompensated resp acidosis on presentation  No noted prior hx/o tobacco abuse or obstructive lung disease  Suspect element of OHS/sleep apnea-noted large body habitus  D dimer  0.31 negative,Cont w/ BIPAP prn S/p Brovana nebulizer BID, d/c'd on 1/20, no more need Continue DuoNeb every 6 hourly as needed Patient needs sleep study and pulmonary follow-up as an outpatient for PFTs  # Acute on chronic heart failure with preserved ejection fraction (HFpEF) 2D ECHO 04/2023 w/ EF 60-65%  hypercarbic resp failure on presentation w/ volume overload  Noted baseline interimittent use of lasix at facility  BNP 625- well above baseline, LE edema resolved   CXR w/ cardiomegaly and vascular congestion preliminarily  S/p IV lasix  Cardiology consulted   # Atrial fibrillation with rapid ventricular response, Resolved s/p cardioversion done on 1/17 Initial HR into 150s, Improved to 50s s/p IV metoprolol x 1  continue eliquis  S/p digoxin 0.5 mg p.o. daily and S/p  Cardizem IV infusion Monitor HR w/ BB use  Cardiology consulted, sp TEE and cardioversion 1/17 Started amiodarone 400 mg p.o. twice daily for 7 days followed by 200 mg p.o. twice daily for 7 days followed by 20 mg p.o. daily as per cardiology 1/18 started metoprolol 25 mg p.o. twice daily as per cardio 1/21 bradycardia, decreased Lopressor 12.5 mg p.o. twice daily 1/22: Remained borderline bradycardic, discontinuing Lopressor  # Hypokalemia, potassium repleted.  # Coronary artery disease, non-occlusive Baseline CAD  No active chest pain  Monitor    # HTN (hypertension) Monitor BP and titrate  home regimen  Patient became hypotensive, received digoxin and Cardizem IV infusion as above 1/18 started metoprolol 25 mg p.o. twice daily as per cardio  # Iron-deficiency, transfer sat 8%, started oral iron supplement. # Vitamin D level 31.7, at lower end, started vitamin D 50k units p.o. weekly. # Vitamin B12  level 378, goal <400, started B12 oral supplement.   Obesity class I Body mass index is 33.06 kg/m.  Interventions:  Diet: Heart healthy diet, fluid restriction 1.5 L/day DVT Prophylaxis: Therapeutic Anticoagulation with Eliquis    Advance goals of care discussion: Full code  Family Communication:   Disposition:  Pt is from ILF (independent living [facility] ), admitted with Resp failure and A.fib, s/p cardioversion done on 1/17.stable to discharge  Insurance declined SNF even with peer to peer. Wife appealed  Subjective: Patient was resting comfortably when seen today.  He wants to get some sleep stating that he could not sleep well last night.  No new concern.  Physical Exam:  General.  Obese elderly man, in no acute distress. Pulmonary.  Lungs clear bilaterally, normal respiratory effort. CV.  Regular rate and rhythm, no JVD, rub or murmur. Abdomen.  Soft, nontender, nondistended, BS positive. CNS.  Alert and oriented .  No focal neurologic deficit. Extremities.  No edema, no cyanosis, pulses intact and symmetrical.    Vitals:   12/31/23 2313 01/01/24 0444 01/01/24 0807 01/01/24 1209  BP: (!) 106/58 112/61 (!) 141/64 124/65  Pulse: 63 (!) 56 (!) 55 (!) 52  Resp: 17 16    Temp: 98.3 F (36.8 C) 98.5 F (36.9 C) (!) 97.2 F (36.2 C) (!) 97.3 F (36.3 C)  TempSrc: Oral Oral    SpO2: 95% 96% 97% 92%  Weight:      Height:        Intake/Output Summary (Last 24 hours) at 01/01/2024 1532 Last data filed at 01/01/2024 1500 Gross per 24 hour  Intake --  Output 300 ml  Net -300 ml   Filed Weights   12/20/23 1110 12/26/23 0600 12/27/23 0847  Weight: 108.9 kg 104.7 kg 104.5 kg    Data Reviewed: Prior data reviewed  CBC: Recent Labs  Lab 12/26/23 0540  WBC 7.9  HGB 12.9*  HCT 39.9  MCV 99.8  PLT 201   Basic Metabolic Panel: Recent Labs  Lab 12/26/23 0540 12/27/23 0559  NA 143 143  K 3.3* 4.4  CL 101 100  CO2 33* 33*  GLUCOSE 104* 98  BUN 19 20   CREATININE 0.96 0.92  CALCIUM 8.2* 8.3*    Studies: No results found.   Scheduled Meds:  amiodarone  200 mg Oral BID   Followed by   Melene Muller ON 01/04/2024] amiodarone  200 mg Oral Daily   apixaban  5 mg Oral BID   vitamin C  500 mg Oral Daily   bisacodyl  10 mg Oral QHS   vitamin B-12  500 mcg Oral Daily   gabapentin  300 mg Oral QHS   hydrocortisone  1 Application Rectal BID   iron polysaccharides  150 mg Oral Daily   polyethylene glycol  17 g Oral BID   Vitamin D (Ergocalciferol)  50,000 Units Oral Q7 days   Continuous Infusions:   PRN Meds: bisacodyl, ipratropium-albuterol, ondansetron (ZOFRAN) IV, sodium chloride  Time spent: 39 minutes  Author: Arnetha Courser. MD [facility] 01/01/2024 3:32 PM  To reach On-call, see care teams to locate the attending and reach out to them via [email or web address]. If  7PM-7AM, please contact night-coverage If you still have difficulty reaching the attending provider, please page the [facility] (Director on Call) for [facility] on amion for assistance.

## 2024-01-01 NOTE — Consult Note (Signed)
[facility] Liaison Note  01/01/2024  Todd Gonzales [birth year] [id number]  Location: RN Hospital Liaison screened the patient remotely at [facility].  Insurance: [employer] Medicare Advantage   Todd Gonzales is a 83 y.o. male who is a Primary Care Patient of Joaquim Nam, MD[facility] at [facility]. The patient was screened for  readmission hospitalization with noted low risk score for unplanned readmission risk with 1 IP in 6 months.  The patient was assessed for potential Care Management service needs for post hospital transition for care coordination. Review of patient's electronic medical record reveals patient was admitted with Acute respiratory failure with hypoxia. Currently pending SNF level of care. If pt is discharged to SNF the facility will continue to address pt's needs. An alterative option provided to spouse/pt was to return to independent living with [facility] services.   Plan: [facility] Liaison will continue to follow progress and disposition to asess for post hospital community care coordination/management needs.  Referral request for community care coordination: pending disposition.   VBCI Care Management/Population Health does not replace or interfere with any arrangements made by the Inpatient Transition of Care team.   For questions contact:   Elliot Cousin, RN, [facility] Liaison [place]   [facility], Population Health Office Hours MTWF  8:00 am-6:00 pm Direct Dial: [phone] mobile [phone] [Office toll free line] Office Hours are M-F 8:30 - 5 pm [email or web address]

## 2024-01-01 NOTE — Plan of Care (Signed)
  Problem: Clinical Measurements: Goal: Ability to maintain clinical measurements within normal limits will improve Outcome: Progressing   Problem: Clinical Measurements: Goal: Respiratory complications will improve Outcome: Progressing   Problem: Clinical Measurements: Goal: Cardiovascular complication will be avoided Outcome: Progressing   Problem: Pain Managment: Goal: General experience of comfort will improve and/or be controlled Outcome: Progressing   Problem: Safety: Goal: Ability to remain free from injury will improve Outcome: Progressing

## 2024-01-02 DIAGNOSIS — I4891 Unspecified atrial fibrillation: Secondary | ICD-10-CM | POA: Diagnosis not present

## 2024-01-02 DIAGNOSIS — I509 Heart failure, unspecified: Secondary | ICD-10-CM | POA: Diagnosis not present

## 2024-01-02 DIAGNOSIS — J9601 Acute respiratory failure with hypoxia: Secondary | ICD-10-CM | POA: Diagnosis not present

## 2024-01-02 DIAGNOSIS — J9621 Acute and chronic respiratory failure with hypoxia: Secondary | ICD-10-CM | POA: Diagnosis not present

## 2024-01-02 NOTE — Progress Notes (Signed)
[facility] Progress Note  Patient: Todd Gonzales    WJX:[id number]  DOA: 12/20/2023     Date of Service: the patient was seen and examined on 01/02/2024  Chief Complaint  Patient presents with   Shortness of Breath   Brief hospital course: Todd Gonzales is a 83 y.o. male with medical history significant of morbid obesity, HFpEF, CAD, atrial fibrillation presenting with acute respiratory failure with hypoxia and hypercarbia, acute on chronic HFpEF.  Patient reports increased work of breathing over the past week or so.  Noted baseline HFpEF.  Reports intermittent compliance with Lasix.  Mild orthopnea PND as well as lower extremity swelling.  No chest pain.  No nausea or vomiting.  No focal hemiparesis or confusion.  Shortness of breath has become more pronounced.  Currently lives at assisted living facility.  Was redirected to the ER for worsening shortness of breath.  Noted to be satting at 82% on room air upon EMS arrival. Presented to ER afebrile, HR 140s-50s, BP stable. Initially in 80s on RA. Placed on BIPAP. VBG w/ hypercarbia. BNP 600s. Cr 1.2. T bili 1.6.   1/22: S/p DCCV and now in sinus rhythm.  Cardiology signed off on 1/18.  Discontinuing metoprolol due to borderline bradycardia.  Patient is also on amiodarone. Currently medically stable and awaiting insurance authorization for SNF.  1/23: Remained hemodynamically stable, still awaiting insurance authorization.  1/24: Still pending final decision from insurance, peer to peer was done and updated PT notes were given as requested.  1/26: Patient was discharged back to his independent facility with home health services yesterday when insurance declined SNF even after peer to peer.  Wife is very nervous and she appealed that decision.  1/27: Remained stable.  Appeal is still pending 1/28: Appeal still pending   Assessment and Plan:  # Acute respiratory failure with hypoxia and hypercapnia, Resolved  Decompensated resp  failure requiring BIPAP in setting of acute on chronic HFpEF  Noted decompensated resp acidosis on presentation  No noted prior hx/o tobacco abuse or obstructive lung disease  Suspect element of OHS/sleep apnea-noted large body habitus  D dimer  0.31 negative,Cont w/ BIPAP prn S/p Brovana nebulizer BID, d/c'd on 1/20, no more need Continue DuoNeb every 6 hourly as needed Patient needs sleep study and pulmonary follow-up as an outpatient for PFTs  # Acute on chronic heart failure with preserved ejection fraction (HFpEF) 2D ECHO 04/2023 w/ EF 60-65%  hypercarbic resp failure on presentation w/ volume overload  Noted baseline interimittent use of lasix at facility  BNP 625- well above baseline, LE edema resolved   CXR w/ cardiomegaly and vascular congestion preliminarily  S/p IV lasix  Cardiology consulted   # Atrial fibrillation with rapid ventricular response, Resolved s/p cardioversion done on 1/17 Initial HR into 150s, Improved to 50s s/p IV metoprolol x 1  continue eliquis  S/p digoxin 0.5 mg p.o. daily and S/p  Cardizem IV infusion Monitor HR w/ BB use  Cardiology consulted, sp TEE and cardioversion 1/17 Started amiodarone 400 mg p.o. twice daily for 7 days followed by 200 mg p.o. twice daily for 7 days followed by 20 mg p.o. daily as per cardiology 1/18 started metoprolol 25 mg p.o. twice daily as per cardio 1/21 bradycardia, decreased Lopressor 12.5 mg p.o. twice daily 1/22: Remained borderline bradycardic, discontinuing Lopressor  # Hypokalemia, potassium repleted.  # Coronary artery disease, non-occlusive Baseline CAD  No active chest pain  Monitor    # HTN (hypertension)  Monitor BP and titrate home regimen  Patient became hypotensive, received digoxin and Cardizem IV infusion as above 1/18 started metoprolol 25 mg p.o. twice daily as per cardio  # Iron-deficiency, transfer sat 8%, started oral iron supplement. # Vitamin D level 31.7, at lower end, started vitamin D  50k units p.o. weekly. # Vitamin B12 level 378, goal <400, started B12 oral supplement.   Obesity class I Body mass index is 33.06 kg/m.  Interventions:  Diet: Heart healthy diet, fluid restriction 1.5 L/day DVT Prophylaxis: Therapeutic Anticoagulation with Eliquis    Advance goals of care discussion: Full code  Family Communication:   Disposition:  Pt is from ILF (independent living [facility] ), admitted with Resp failure and A.fib, s/p cardioversion done on 1/17.stable to discharge  Insurance declined SNF even with peer to peer. Wife appealed  Subjective: Patient was sitting comfortably in chair when seen today.  No new concern.  Physical Exam:  General.  Obese elderly man, in no acute distress. Pulmonary.  Lungs clear bilaterally, normal respiratory effort. CV.  Regular rate and rhythm, no JVD, rub or murmur. Abdomen.  Soft, nontender, nondistended, BS positive. CNS.  Alert and oriented .  No focal neurologic deficit. Extremities.  No edema, no cyanosis, pulses intact and symmetrical. Psychiatry.  Judgment and insight appears normal.     Vitals:   01/01/24 2315 01/02/24 0335 01/02/24 0835 01/02/24 1259  BP: (!) 129/54 (!) 128/57 105/63 (!) 124/56  Pulse: (!) 59 60 (!) 54 (!) 57  Resp: 14 16  16   Temp: 98.3 F (36.8 C) 98.3 F (36.8 C) 98.6 F (37 C) 98.7 F (37.1 C)  TempSrc:   Oral Axillary  SpO2: 94% 93% 94% 93%  Weight:      Height:        Intake/Output Summary (Last 24 hours) at 01/02/2024 1604 Last data filed at 01/02/2024 1047 Gross per 24 hour  Intake 240 ml  Output 400 ml  Net -160 ml   Filed Weights   12/20/23 1110 12/26/23 0600 12/27/23 0847  Weight: 108.9 kg 104.7 kg 104.5 kg    Data Reviewed: Prior data reviewed  CBC: No results for input(s): "WBC", "NEUTROABS", "HGB", "HCT", "MCV", "PLT" in the last 168 hours.  Basic Metabolic Panel: Recent Labs  Lab 12/27/23 0559  NA 143  K 4.4  CL 100  CO2 33*  GLUCOSE 98  BUN 20   CREATININE 0.92  CALCIUM 8.3*    Studies: No results found.   Scheduled Meds:  amiodarone  200 mg Oral BID   Followed by   Melene Muller ON 01/04/2024] amiodarone  200 mg Oral Daily   apixaban  5 mg Oral BID   vitamin C  500 mg Oral Daily   bisacodyl  10 mg Oral QHS   vitamin B-12  500 mcg Oral Daily   gabapentin  300 mg Oral QHS   hydrocortisone  1 Application Rectal BID   iron polysaccharides  150 mg Oral Daily   polyethylene glycol  17 g Oral BID   Vitamin D (Ergocalciferol)  50,000 Units Oral Q7 days   Continuous Infusions:   PRN Meds: bisacodyl, ipratropium-albuterol, ondansetron (ZOFRAN) IV, sodium chloride  Time spent: 38 minutes  Author: Arnetha Courser. MD [facility] 01/02/2024 4:04 PM  To reach On-call, see care teams to locate the attending and reach out to them via [email or web address]. If 7PM-7AM, please contact night-coverage If you still have difficulty reaching the attending provider, please page the [facility] (Director on  Call) for [facility] on amion for assistance.

## 2024-01-02 NOTE — TOC Progression Note (Addendum)
Transition of Care [facility]) - Progression Note    Patient Details  Name: Todd Gonzales MRN: [id number] Date of Birth: [birth year]  [facility]) CM/SW Contact  Truddie Hidden, RN Phone Number: 01/02/2024, 11:15 AM  Clinical Narrative:    Per Navi portal, appeal is still pending.   12:30pm Spoke with patient and his wife at bedside to advised per Navi portal appeal is still pending.      Expected Discharge Plan: Skilled Nursing Facility Barriers to Discharge: Continued Medical Work up  Expected Discharge Plan and Services     Post Acute Care Choice: Skilled Nursing Facility Living arrangements for the past 2 months: Independent Living Facility Expected Discharge Date: 12/30/23                                     Social Determinants of Health (SDOH) Interventions SDOH Screenings   Food Insecurity: No Food Insecurity (12/22/2023)  Housing: Low Risk  (12/22/2023)  Transportation Needs: No Transportation Needs (12/22/2023)  Utilities: Not At Risk (12/22/2023)  Alcohol Screen: Low Risk  (08/08/2023)  Depression (PHQ2-9): Low Risk  (08/08/2023)  Financial Resource Strain: Low Risk  (08/08/2023)  Physical Activity: Inactive (08/08/2023)  Social Connections: Socially Integrated (12/22/2023)  Stress: No Stress Concern Present (08/08/2023)  Tobacco Use: Low Risk  (12/20/2023)  Health Literacy: Adequate Health Literacy (08/08/2023)    Readmission Risk Interventions    04/16/2023   11:32 AM  Readmission Risk Prevention Plan  Post Dischage Appt Complete  Medication Screening Complete  Transportation Screening Complete

## 2024-01-02 NOTE — Progress Notes (Signed)
Physical Therapy Treatment Patient Details Name: Todd Gonzales MRN: [id number] DOB: 03/29/1941 Today's Date: 01/02/2024   History of Present Illness Todd Gonzales is an 83 y.o. male history of CHF, hypertension, hyperlipidemia, atrial fibrillation, presenting with shortness of breath and recent history of chest pain.  Per EMS when they arrived pt was satting 86% on room air and was placed on 4 L nasal cannula with improvement to his oxygen status.  MD assessment includes: acute respiratory failure with hypoxia and hypercapnia, acute on chronic heart failure with preserved ejection fraction, atrial fibrillation with rapid ventricular response, and hypokalemia.    PT Comments  Pt was pleasant and motivated to participate during the session and put forth good effort throughout. Pt continued to require increased time and effort with functional tasks and for the EOB to be elevated during transfer training but required no physical assistance during the session.  Pt required cuing for proper sequencing with the RW for safety but presented with no overt LOB.  During static and dynamic standing balance training pt presented with min to mod sway most notably with eyes closed but required no physical assist to prevent LOB.  Pt reported no adverse symptoms during the session with SpO2 and HR WNL on room air.  Pt will benefit from continued PT services upon discharge to safely address deficits listed in patient problem list for decreased caregiver assistance and eventual return to PLOF.      If plan is discharge home, recommend the following: A little help with walking and/or transfers;A little help with bathing/dressing/bathroom;Assistance with cooking/housework;Assist for transportation   Can travel by private vehicle     No  Equipment Recommendations  None recommended by PT    Recommendations for Other Services       Precautions / Restrictions Precautions Precautions: Fall Precaution Comments: watch  O2 Restrictions Weight Bearing Restrictions Per Provider Order: No     Mobility  Bed Mobility Overal bed mobility: Modified Independent             General bed mobility comments: Extra time, effort, and use of bed rail but no physical assistance needed    Transfers Overall transfer level: Needs assistance Equipment used: Rolling walker (2 wheels) Transfers: Sit to/from Stand Sit to Stand: Contact guard assist, From elevated surface           General transfer comment: Increased time and effort with use of RUE to come to standing and to control descent; required the EOB to be moderately elevated to come to standing    Ambulation/Gait Ambulation/Gait assistance: Contact guard assist Gait Distance (Feet): 60 Feet x 1, 40 Feet x 1 Assistive device: Rolling walker (2 wheels) Gait Pattern/deviations: Step-through pattern, Decreased step length - right, Decreased step length - left, Trunk flexed Gait velocity: decreased     General Gait Details: Slow cadence with short B step length and min verbal cues for amb closer to the [employer] Mobility     Tilt Bed    Modified Rankin (Stroke Patients Only)       Balance Overall balance assessment: Needs assistance Sitting-balance support: Feet supported Sitting balance-Leahy Scale: Good     Standing balance support: Bilateral upper extremity supported, During functional activity, Reliant on assistive device for balance Standing balance-Leahy Scale: Fair  Cognition Arousal: Alert Behavior During Therapy: WFL for tasks assessed/performed Overall Cognitive Status: Within Functional Limits for tasks assessed                                          Exercises Other Exercises Other Exercises: Static standing balance training with feet together and in semi-tandem with combinations of eyes open and closed and head still/head  turns Other Exercises: Dynamic standing balance training with feet together and in semi-tandem with reaching outside BOS    General Comments        Pertinent Vitals/Pain Pain Assessment Pain Assessment: No/denies pain    Home Living                          Prior Function            PT Goals (current goals can now be found in the care plan section) Progress towards PT goals: Progressing toward goals    Frequency    Min 1X/week      PT Plan      Co-evaluation              AM-PAC PT "6 Clicks" Mobility   Outcome Measure  Help needed turning from your back to your side while in a flat bed without using bedrails?: None Help needed moving from lying on your back to sitting on the side of a flat bed without using bedrails?: A Little Help needed moving to and from a bed to a chair (including a wheelchair)?: A Little Help needed standing up from a chair using your arms (e.g., wheelchair or bedside chair)?: A Little Help needed to walk in hospital room?: A Little Help needed climbing 3-5 steps with a railing? : A Lot 6 Click Score: 18    End of Session Equipment Utilized During Treatment: Gait belt Activity Tolerance: Patient tolerated treatment well Patient left: in bed;with call bell/phone within reach;with bed alarm set;with family/visitor present Nurse Communication: Mobility status PT Visit Diagnosis: Other abnormalities of gait and mobility (R26.89);Muscle weakness (generalized) (M62.81);Difficulty in walking, not elsewhere classified (R26.2)     Time: 1610-9604 PT Time Calculation (min) (ACUTE ONLY): 32 min  Charges:    $Gait Training: 8-22 mins $Therapeutic Exercise: 8-22 mins PT General Charges $$ ACUTE PT VISIT: 1 Visit                     D. Scott Esraa Seres PT, DPT 01/02/24, 4:06 PM

## 2024-01-03 DIAGNOSIS — J9601 Acute respiratory failure with hypoxia: Secondary | ICD-10-CM | POA: Diagnosis not present

## 2024-01-03 NOTE — TOC Progression Note (Addendum)
Transition of Care [facility]) - Progression Note    Patient Details  Name: Todd Gonzales MRN: [id number] Date of Birth: [birth year]  Transition of Care [facility]) CM/SW Contact  Truddie Hidden, RN Phone Number: 01/03/2024, 10:40 AM  Clinical Narrative:    Retrieved call from [facility] Assistant Director, Wandra Mannan  regarding discharge plans. Zack informed of [facility] benefits for existing residents for 3 day respite care post discharge. Per Navi portal patient appeal still pending. Zack notified of pending status.   9:42am RNCM spoke with Sue Lush, Admissions Director at [facility] regarding patient's return to facility using respite days while waiting on appeal decision. She stated patient can admit to the ALF for respite but will not receive therapy. [facility] [place] inquired about penalty for discharging without a formal decision from his plan.  9:48 am RNCM contacted [place] at [facility] and [place] via [place] @ [phone]. Per Neysa Bonito a determination on appeal should be rendered by tomorrow as they as 72 hours for TAT is allowed for fast appeals. Per [place] patient can discharge to home without any penalty's while a determination on appeal is this being reviewed.   10:40 am RNCM attempted to reach Daisy at [facility]. No answer. Message left regarding determination status. Sue Lush advised patient can discharge without  payor penalty or ramifications. MD notified patient is able to discharge to facility.   10:52 am Spoke with Sue Lush from [facility]. She stated the SW from [facility] was attempting to contact patient's wife but has been unsuccessful. RNCM attempted to contact patient's spouse to advise of likely discharge today. No answer. Left a message.  1:19 pm Attempt to reach patient's spouse, no answer.    Expected Discharge Plan: Skilled Nursing Facility Barriers to Discharge: Continued Medical Work up  Expected Discharge Plan and Services     Post Acute Care Choice: Skilled Nursing Facility Living  arrangements for the past 2 months: Independent Living Facility Expected Discharge Date: 12/30/23                                     Social Determinants of Health (SDOH) Interventions SDOH Screenings   Food Insecurity: No Food Insecurity (12/22/2023)  Housing: Low Risk  (12/22/2023)  Transportation Needs: No Transportation Needs (12/22/2023)  Utilities: Not At Risk (12/22/2023)  Alcohol Screen: Low Risk  (08/08/2023)  Depression (PHQ2-9): Low Risk  (08/08/2023)  Financial Resource Strain: Low Risk  (08/08/2023)  Physical Activity: Inactive (08/08/2023)  Social Connections: Socially Integrated (12/22/2023)  Stress: No Stress Concern Present (08/08/2023)  Tobacco Use: Low Risk  (12/20/2023)  Health Literacy: Adequate Health Literacy (08/08/2023)    Readmission Risk Interventions    04/16/2023   11:32 AM  Readmission Risk Prevention Plan  Post Dischage Appt Complete  Medication Screening Complete  Transportation Screening Complete

## 2024-01-03 NOTE — Progress Notes (Signed)
{  Select_TRH_Note:[id number]}

## 2024-01-04 DIAGNOSIS — J9601 Acute respiratory failure with hypoxia: Secondary | ICD-10-CM | POA: Diagnosis not present

## 2024-01-04 NOTE — Progress Notes (Signed)
PT Cancellation Note  Patient Details Name: CARMELLO CABINESS MRN: [id number] DOB: 05-17-1941   Cancelled Treatment:     PT 2 x this date. First attempt, pt just returning to bed from sitting in recliner. 2nd attempt, pt/pt's spouse very upset from lack of insurance approval for rehab. Author explained at length that if they were to leave AMA without waiting for insurance determination, would have to private pay for STR. Dinner tray arrived to room. Pt being transferred to new rm after he eats dinner. Acute PT will return tomorrow and continue to follow per current POC.    Rushie Chestnut 01/04/2024, 4:52 PM

## 2024-01-04 NOTE — Plan of Care (Signed)

## 2024-01-04 NOTE — Progress Notes (Signed)
[facility] Progress Note  Patient: Todd Gonzales    EAV:[id number]  DOA: 12/20/2023     Date of Service: the patient was seen and examined on 01/04/2024  Chief Complaint  Patient presents with   Shortness of Breath   Brief hospital course: SECUNDINO ELLITHORPE is a 83 y.o. male with medical history significant of morbid obesity, HFpEF, CAD, atrial fibrillation presenting with acute respiratory failure with hypoxia and hypercarbia, acute on chronic HFpEF.  Patient reports increased work of breathing over the past week or so.  Noted baseline HFpEF.  Reports intermittent compliance with Lasix.  Mild orthopnea PND as well as lower extremity swelling.  No chest pain.  No nausea or vomiting.  No focal hemiparesis or confusion.  Shortness of breath has become more pronounced.  Currently lives at assisted living facility.  Was redirected to the ER for worsening shortness of breath.  Noted to be satting at 82% on room air upon EMS arrival. Presented to ER afebrile, HR 140s-50s, BP stable. Initially in 80s on RA. Placed on BIPAP. VBG w/ hypercarbia. BNP 600s. Cr 1.2. T bili 1.6.   1/22: S/p DCCV and now in sinus rhythm.  Cardiology signed off on 1/18.  Discontinuing metoprolol due to borderline bradycardia.  Patient is also on amiodarone. Currently medically stable and awaiting insurance authorization for SNF.  1/23: Remained hemodynamically stable, still awaiting insurance authorization.  1/24: Still pending final decision from insurance, peer to peer was done and updated PT notes were given as requested.  1/26: Patient was discharged back to his independent facility with home health services yesterday when insurance declined SNF even after peer to peer.  Wife is very nervous and she appealed that decision.  1/27: Remained stable.  Appeal is still pending 1/28: Appeal still pending   Assessment and Plan:  # Acute respiratory failure with hypoxia and hypercapnia, Resolved  Decompensated resp  failure requiring BIPAP in setting of acute on chronic HFpEF  Noted decompensated resp acidosis on presentation  No noted prior hx/o tobacco abuse or obstructive lung disease  Suspect element of OHS/sleep apnea-noted large body habitus  D dimer  0.31 negative,Cont w/ BIPAP prn S/p Brovana nebulizer BID, d/c'd on 1/20, no more need Continue DuoNeb every 6 hourly as needed Patient needs sleep study and pulmonary follow-up as an outpatient for PFTs  # Acute on chronic heart failure with preserved ejection fraction (HFpEF) 2D ECHO 04/2023 w/ EF 60-65%  hypercarbic resp failure on presentation w/ volume overload  Noted baseline interimittent use of lasix at facility  BNP 625- well above baseline, LE edema resolved   CXR w/ cardiomegaly and vascular congestion preliminarily  S/p IV lasix  Cardiology consulted   # Atrial fibrillation with rapid ventricular response, Resolved s/p cardioversion done on 1/17 Initial HR into 150s, Improved to 50s s/p IV metoprolol x 1  continue eliquis  S/p digoxin 0.5 mg p.o. daily and S/p  Cardizem IV infusion Monitor HR w/ BB use  Cardiology consulted, sp TEE and cardioversion 1/17 Started amiodarone 400 mg p.o. twice daily for 7 days followed by 200 mg p.o. twice daily for 7 days followed by 20 mg p.o. daily as per cardiology 1/18 started metoprolol 25 mg p.o. twice daily as per cardio 1/21 bradycardia, decreased Lopressor 12.5 mg p.o. twice daily 1/22: Remained borderline bradycardic, discontinuing Lopressor  # Hypokalemia, potassium repleted.  # Coronary artery disease, non-occlusive Baseline CAD  No active chest pain  Monitor    # HTN (hypertension)  Monitor BP and titrate home regimen  Patient became hypotensive, received digoxin and Cardizem IV infusion as above 1/18 started metoprolol 25 mg p.o. twice daily as per cardio  # Iron-deficiency, transfer sat 8%, started oral iron supplement. # Vitamin D level 31.7, at lower end, started vitamin D  50k units p.o. weekly. # Vitamin B12 level 378, goal <400, started B12 oral supplement.   Obesity class I Body mass index is 31.92 kg/m.  Interventions:  Diet: Heart healthy diet, fluid restriction 1.5 L/day DVT Prophylaxis: Therapeutic Anticoagulation with Eliquis    Advance goals of care discussion: Full code  Family Communication: wife updated at bedside today  Disposition:  Pt is from ILF (independent living [place] ), admitted with Resp failure and A.fib, s/p cardioversion done on 1/17.stable to discharge  Insurance declined SNF even with peer to peer. Wife appealed  Subjective:  No change.    Physical Exam:  Constitutional: NAD, AAOx3 HEENT: conjunctivae and lids normal, EOMI CV: No cyanosis.   RESP: normal respiratory effort, on RA Neuro: II - XII grossly intact.   Psych: Normal mood and affect.  Appropriate judgement and reason    Vitals:   01/03/24 2058 01/04/24 0353 01/04/24 0821 01/04/24 1725  BP: (!) 153/63 100/88 135/69 (!) 153/73  Pulse: (!) 57 (!) 54 (!) 56 (!) 57  Resp: 19 18 20 18   Temp: 97.9 F (36.6 C) 98 F (36.7 C) 97.8 F (36.6 C) 99.2 F (37.3 C)  TempSrc: Oral Oral Oral   SpO2: 95% 94% 95% 95%  Weight:    103.8 kg  Height:    5\' 11"  (1.803 m)    Intake/Output Summary (Last 24 hours) at 01/04/2024 1808 Last data filed at 01/04/2024 0900 Gross per 24 hour  Intake --  Output 450 ml  Net -450 ml   Filed Weights   12/26/23 0600 12/27/23 0847 01/04/24 1725  Weight: 104.7 kg 104.5 kg 103.8 kg    Data Reviewed: Prior data reviewed  CBC: No results for input(s): "WBC", "NEUTROABS", "HGB", "HCT", "MCV", "PLT" in the last 168 hours.  Basic Metabolic Panel: No results for input(s): "NA", "K", "CL", "CO2", "GLUCOSE", "BUN", "CREATININE", "CALCIUM", "MG", "PHOS" in the last 168 hours.   Studies: No results found.   Scheduled Meds:  amiodarone  200 mg Oral Daily   apixaban  5 mg Oral BID   vitamin C  500 mg Oral Daily    bisacodyl  10 mg Oral QHS   vitamin B-12  500 mcg Oral Daily   gabapentin  300 mg Oral QHS   hydrocortisone  1 Application Rectal BID   iron polysaccharides  150 mg Oral Daily   polyethylene glycol  17 g Oral BID   Vitamin D (Ergocalciferol)  50,000 Units Oral Q7 days   Continuous Infusions:   PRN Meds: bisacodyl, ipratropium-albuterol, ondansetron (ZOFRAN) IV, sodium chloride  Time spent: 25 minutes

## 2024-01-04 NOTE — Care Management Important Message (Signed)
Important Message  Patient Details  Name: Todd Gonzales MRN: [id number] Date of Birth: 20-Apr-1941   Important Message Given:  Yes - Medicare IM     Sherilyn Banker 01/04/2024, 12:38 PM

## 2024-01-04 NOTE — TOC Progression Note (Addendum)
Transition of Care [facility]) - Progression Note    Patient Details  Name: Todd Gonzales MRN: [id number] Date of Birth: [birth year]  Transition of Care [facility]) CM/SW Contact  Truddie Hidden, RN Phone Number: 01/04/2024, 2:45 PM  Clinical Narrative:  [employer] @ [phone], Spoke with Herbert Seta. Appeal was filed 1/27. Appeal is still pending. Determination deadline is 72 hours after notification was received.    4:15 Spoke with patient and his wife regarding discharge plan. They were notified appeal is still pending. RNCM discussed plans for [facility] in the event appeal for SNF is denied. Mrs. Sand and patient would like to discuss HH before making a decision.  4:18pm Per Nav[employer] portal appeal still pending. Contacted Tyler B @ [employer] @ Navi determination has not been determined. Per Joselyn Glassman Case was received at [facility] 1/27 6:16am CST. [employer] received appeal 1/25@ 1:42 pm EST. Attempt to contact [employer] directly @ [phone] line is directed back to NAVI.  4:49pm Patient wife stated if appeal is denied for SNF they will private pay for rehab for one week. They do not have a choice of facility.    Expected Discharge Plan: Skilled Nursing Facility Barriers to Discharge: Continued Medical Work up  Expected Discharge Plan and Services     Post Acute Care Choice: Skilled Nursing Facility Living arrangements for the past 2 months: Independent Living Facility Expected Discharge Date: 12/30/23                                     Social Determinants of Health (SDOH) Interventions SDOH Screenings   Food Insecurity: No Food Insecurity (12/22/2023)  Housing: Low Risk  (12/22/2023)  Transportation Needs: No Transportation Needs (12/22/2023)  Utilities: Not At Risk (12/22/2023)  Alcohol Screen: Low Risk  (08/08/2023)  Depression (PHQ2-9): Low Risk  (08/08/2023)  Financial Resource Strain: Low Risk  (08/08/2023)  Physical Activity: Inactive (08/08/2023)  Social Connections: Socially Integrated  (12/22/2023)  Stress: No Stress Concern Present (08/08/2023)  Tobacco Use: Low Risk  (12/20/2023)  Health Literacy: Adequate Health Literacy (08/08/2023)    Readmission Risk Interventions    04/16/2023   11:32 AM  Readmission Risk Prevention Plan  Post Dischage Appt Complete  Medication Screening Complete  Transportation Screening Complete

## 2024-01-05 DIAGNOSIS — J9601 Acute respiratory failure with hypoxia: Secondary | ICD-10-CM | POA: Diagnosis not present

## 2024-01-05 NOTE — Plan of Care (Signed)

## 2024-01-05 NOTE — Progress Notes (Signed)
Mobility Specialist - Progress Note     01/05/24 1535  Mobility  Activity Ambulated with assistance in hallway  Level of Assistance Standby assist, set-up cues, supervision of patient - no hands on  Assistive Device Front wheel walker  Distance Ambulated (ft) 160 ft  Range of Motion/Exercises Active  Activity Response Tolerated well  Mobility Referral Yes  Mobility visit 1 Mobility  Mobility Specialist Start Time (ACUTE ONLY) 1517  Mobility Specialist Stop Time (ACUTE ONLY) 1535  Mobility Specialist Time Calculation (min) (ACUTE ONLY) 18 min   Pt standing in bathroom upon entry on RA. Pt endorses no new pain besides persistant arthritis. Pt STS and ambulates to hallway around NS SBA for 1 lap. Pt returned to recliner and left with needs in reach. Pt wife present at bedside.   Todd Gonzales Mobility Specialist 01/05/24, 3:46 PM

## 2024-01-05 NOTE — Progress Notes (Signed)
Mobility Specialist - Progress Note    01/05/24 1517  Mobility  Activity Ambulated with assistance to bathroom;Stood at bedside  Level of Assistance Standby assist, set-up cues, supervision of patient - no hands on  Assistive Device Front wheel walker  Distance Ambulated (ft) 10 ft  Range of Motion/Exercises Active  Activity Response Tolerated well  Mobility Referral Yes  Mobility visit 1 Mobility  Mobility Specialist Start Time (ACUTE ONLY) 1510   Pt resting in recliner on RA upon entry. Pt STS and ambulates to bathroom. Pt requested additional time and told to press call bell when he is ready. Pt left with needs in reach and wife present at bedside.   Todd Gonzales Mobility Specialist 01/05/24, 3:20 PM

## 2024-01-05 NOTE — TOC Progression Note (Signed)
Transition of Care [facility]) - Progression Note    Patient Details  Name: Todd Gonzales MRN: [id number] Date of Birth: 02/26/1941  Transition [facility] Care [facility]) CM/SW Contact  Allena Katz, LCSW Phone Number: 01/05/2024, 2:58 PM  Clinical Narrative:     Appeal still pending as of 2:58pm today.    Expected Discharge Plan: Skilled Nursing Facility Barriers to Discharge: Continued Medical Work up  Expected Discharge Plan and Services     Post Acute Care Choice: Skilled Nursing Facility Living arrangements for the past 2 months: Independent Living Facility Expected Discharge Date: 12/30/23                                     Social Determinants of Health (SDOH) Interventions SDOH Screenings   Food Insecurity: No Food Insecurity (12/22/2023)  Housing: Low Risk  (12/22/2023)  Transportation Needs: No Transportation Needs (12/22/2023)  Utilities: Not At Risk (12/22/2023)  Alcohol Screen: Low Risk  (08/08/2023)  Depression (PHQ2-9): Low Risk  (08/08/2023)  Financial Resource Strain: Low Risk  (08/08/2023)  Physical Activity: Inactive (08/08/2023)  Social Connections: Socially Integrated (12/22/2023)  Stress: No Stress Concern Present (08/08/2023)  Tobacco Use: Low Risk  (12/20/2023)  Health Literacy: Adequate Health Literacy (08/08/2023)    Readmission Risk Interventions    04/16/2023   11:32 AM  Readmission Risk Prevention Plan  Post Dischage Appt Complete  Medication Screening Complete  Transportation Screening Complete

## 2024-01-05 NOTE — Progress Notes (Signed)
[facility] Progress Note  Patient: Todd Gonzales    ZOX:[id number]  DOA: 12/20/2023     Date of Service: the patient was seen and examined on 01/05/2024  Chief Complaint  Patient presents with   Shortness of Breath   Brief hospital course: MORLEY GAUMER is a 83 y.o. male with medical history significant of morbid obesity, HFpEF, CAD, atrial fibrillation presenting with acute respiratory failure with hypoxia and hypercarbia, acute on chronic HFpEF.  Patient reports increased work of breathing over the past week or so.  Noted baseline HFpEF.  Reports intermittent compliance with Lasix.  Mild orthopnea PND as well as lower extremity swelling.  No chest pain.  No nausea or vomiting.  No focal hemiparesis or confusion.  Shortness of breath has become more pronounced.  Currently lives at assisted living facility.  Was redirected to the ER for worsening shortness of breath.  Noted to be satting at 82% on room air upon EMS arrival. Presented to ER afebrile, HR 140s-50s, BP stable. Initially in 80s on RA. Placed on BIPAP. VBG w/ hypercarbia. BNP 600s. Cr 1.2. T bili 1.6.   1/22: S/p DCCV and now in sinus rhythm.  Cardiology signed off on 1/18.  Discontinuing metoprolol due to borderline bradycardia.  Patient is also on amiodarone. Currently medically stable and awaiting insurance authorization for SNF.  1/23: Remained hemodynamically stable, still awaiting insurance authorization.  1/24: Still pending final decision from insurance, peer to peer was done and updated PT notes were given as requested.  1/26: Patient was discharged back to his independent facility with home health services yesterday when insurance declined SNF even after peer to peer.  Wife is very nervous and she appealed that decision.  1/27: Remained stable.  Appeal is still pending 1/28: Appeal still pending   Assessment and Plan:  # Acute respiratory failure with hypoxia and hypercapnia, Resolved  Decompensated resp  failure requiring BIPAP in setting of acute on chronic HFpEF  Noted decompensated resp acidosis on presentation  No noted prior hx/o tobacco abuse or obstructive lung disease  Suspect element of OHS/sleep apnea-noted large body habitus  D dimer  0.31 negative,Cont w/ BIPAP prn S/p Brovana nebulizer BID, d/c'd on 1/20, no more need Continue DuoNeb every 6 hourly as needed Patient needs sleep study and pulmonary follow-up as an outpatient for PFTs  # Acute on chronic heart failure with preserved ejection fraction (HFpEF) 2D ECHO 04/2023 w/ EF 60-65%  hypercarbic resp failure on presentation w/ volume overload  Noted baseline interimittent use of lasix at facility  BNP 625- well above baseline, LE edema resolved   CXR w/ cardiomegaly and vascular congestion preliminarily  S/p IV lasix  Cardiology consulted   # Atrial fibrillation with rapid ventricular response, Resolved s/p cardioversion done on 1/17 Initial HR into 150s, Improved to 50s s/p IV metoprolol x 1  continue eliquis  S/p digoxin 0.5 mg p.o. daily and S/p  Cardizem IV infusion Monitor HR w/ BB use  Cardiology consulted, sp TEE and cardioversion 1/17 Started amiodarone 400 mg p.o. twice daily for 7 days followed by 200 mg p.o. twice daily for 7 days followed by 20 mg p.o. daily as per cardiology 1/18 started metoprolol 25 mg p.o. twice daily as per cardio 1/21 bradycardia, decreased Lopressor 12.5 mg p.o. twice daily 1/22: Remained borderline bradycardic, discontinuing Lopressor  # Hypokalemia, potassium repleted.  # Coronary artery disease, non-occlusive Baseline CAD  No active chest pain  Monitor    # HTN (hypertension)  Monitor BP and titrate home regimen  Patient became hypotensive, received digoxin and Cardizem IV infusion as above 1/18 started metoprolol 25 mg p.o. twice daily as per cardio  # Iron-deficiency, transfer sat 8%, started oral iron supplement. # Vitamin D level 31.7, at lower end, started vitamin D  50k units p.o. weekly. # Vitamin B12 level 378, goal <400, started B12 oral supplement.   Obesity class I Body mass index is 31.92 kg/m.  Interventions:  Diet: Heart healthy diet, fluid restriction 1.5 L/day DVT Prophylaxis: Therapeutic Anticoagulation with Eliquis    Advance goals of care discussion: Full code  Family Communication: wife updated at bedside today  Disposition:  Pt is from ILF (independent living [place] ), admitted with Resp failure and A.fib, s/p cardioversion done on 1/17.stable to discharge  Insurance declined SNF even with peer to peer. Wife appealed  Subjective:  No change today.  Still haven't heard the decision on the Snf appeal  Physical Exam:  Constitutional: NAD, AAOx3 HEENT: conjunctivae and lids normal, EOMI CV: No cyanosis.   RESP: normal respiratory effort, on RA Neuro: II - XII grossly intact.   Psych: Normal mood and affect.  Appropriate judgement and reason   Vitals:   01/05/24 0817 01/05/24 0900 01/05/24 1613 01/05/24 2006  BP: (!) 152/82  132/73 (!) 124/58  Pulse: (!) 57 68 (!) 58 (!) 57  Resp: 18  18 18   Temp: 98.2 F (36.8 C)   99 F (37.2 C)  TempSrc:      SpO2: 97%  98% 95%  Weight:      Height:        Intake/Output Summary (Last 24 hours) at 01/05/2024 2015 Last data filed at 01/05/2024 1900 Gross per 24 hour  Intake 480 ml  Output --  Net 480 ml   Filed Weights   12/26/23 0600 12/27/23 0847 01/04/24 1725  Weight: 104.7 kg 104.5 kg 103.8 kg    Data Reviewed: Prior data reviewed  CBC: No results for input(s): "WBC", "NEUTROABS", "HGB", "HCT", "MCV", "PLT" in the last 168 hours.  Basic Metabolic Panel: No results for input(s): "NA", "K", "CL", "CO2", "GLUCOSE", "BUN", "CREATININE", "CALCIUM", "MG", "PHOS" in the last 168 hours.   Studies: No results found.   Scheduled Meds:  amiodarone  200 mg Oral Daily   apixaban  5 mg Oral BID   vitamin C  500 mg Oral Daily   bisacodyl  10 mg Oral QHS   vitamin  B-12  500 mcg Oral Daily   gabapentin  300 mg Oral QHS   hydrocortisone  1 Application Rectal BID   iron polysaccharides  150 mg Oral Daily   polyethylene glycol  17 g Oral BID   Vitamin D (Ergocalciferol)  50,000 Units Oral Q7 days   Continuous Infusions:   PRN Meds: bisacodyl, ipratropium-albuterol, ondansetron (ZOFRAN) IV, sodium chloride  Time spent: 25 minutes

## 2024-01-05 NOTE — Progress Notes (Signed)
Physical Therapy Treatment Patient Details Name: Todd Gonzales MRN: [id number] DOB: 1941/01/19 Today's Date: 01/05/2024   History of Present Illness Todd Gonzales is an 83 y.o. male history of CHF, hypertension, hyperlipidemia, atrial fibrillation, presenting with shortness of breath and recent history of chest pain.  Per EMS when they arrived pt was satting 86% on room air and was placed on 4 L nasal cannula with improvement to his oxygen status.  MD assessment includes: acute respiratory failure with hypoxia and hypercapnia, acute on chronic heart failure with preserved ejection fraction, atrial fibrillation with rapid ventricular response, and hypokalemia.    PT Comments  Pt was supine (HOB elevated ~ 20 degrees upon arrival. He is A and O x 4. Easily able to follow commands and is motivated throughout. Pt is agreeable to OOB. Bed placed in flat position to simulate home environment. Pt requires min assist + use of bed rail to achieve sitting. Pt was able to stand from elevated bed height with CGA however from recliner/standard height requires min assist. CGA to ambulate ~ 120 ft. No LOB however poor gait posture with use of RW. Pt states he is still far form his baseline abilities. DC recs remain appropriate. Acute PT will continue to follow per current POC.     If plan is discharge home, recommend the following: A little help with walking and/or transfers;A little help with bathing/dressing/bathroom;Assistance with cooking/housework;Assist for transportation     Equipment Recommendations  None recommended by PT       Precautions / Restrictions Precautions Precautions: Fall Precaution Comments: watch O2 Restrictions Weight Bearing Restrictions Per Provider Order: No     Mobility  Bed Mobility Overal bed mobility: Needs Assistance Bed Mobility: Supine to Sit  Supine to sit: Min assist  General bed mobility comments: with bed in flat position, pt required min assist + use of bedrail to  achieve EOB sitting.    Transfers Overall transfer level: Needs assistance Equipment used: Rolling walker (2 wheels) Transfers: Sit to/from Stand Sit to Stand: Contact guard assist, From elevated surface, Min assist  General transfer comment: CGA from elevated bed height. min asisst from recliner and lower surfaces    Ambulation/Gait Ambulation/Gait assistance: Contact guard assist Gait Distance (Feet): 120 Feet Assistive device: Rolling walker (2 wheels) Gait Pattern/deviations: Step-through pattern, Decreased step length - right, Decreased step length - left, Trunk flexed Gait velocity: decreased  General Gait Details: poor gait posture however no LOB. HR and O2 stable on rm air.   Balance Overall balance assessment: Needs assistance Sitting-balance support: Feet supported Sitting balance-Leahy Scale: Good     Standing balance support: Bilateral upper extremity supported, During functional activity, Reliant on assistive device for balance Standing balance-Leahy Scale: Fair       Cognition Arousal: Alert Behavior During Therapy: WFL for tasks assessed/performed Overall Cognitive Status: Within Functional Limits for tasks assessed        General Comments: Pt is A and O x 3. Agreeable and motivated. pleasant throughout               Pertinent Vitals/Pain Pain Assessment Pain Assessment: No/denies pain Pain Score: 0-No pain     PT Goals (current goals can now be found in the care plan section) Acute Rehab PT Goals Patient Stated Goal: get out of the hospital Progress towards PT goals: Progressing toward goals    Frequency    Min 1X/week       AM-PAC PT "6 Clicks" Mobility   Outcome  Measure  Help needed turning from your back to your side while in a flat bed without using bedrails?: None Help needed moving from lying on your back to sitting on the side of a flat bed without using bedrails?: A Little Help needed moving to and from a bed to a chair  (including a wheelchair)?: A Little Help needed standing up from a chair using your arms (e.g., wheelchair or bedside chair)?: A Little Help needed to walk in hospital room?: A Little Help needed climbing 3-5 steps with a railing? : A Little 6 Click Score: 19    End of Session   Activity Tolerance: Patient tolerated treatment well;Patient limited by fatigue Patient left: in chair;with call bell/phone within reach;with nursing/sitter in room Nurse Communication: Mobility status PT Visit Diagnosis: Other abnormalities of gait and mobility (R26.89);Muscle weakness (generalized) (M62.81);Difficulty in walking, not elsewhere classified (R26.2)     Time: 4098-1191 PT Time Calculation (min) (ACUTE ONLY): 17 min  Charges:    $Gait Training: 8-22 mins PT General Charges $$ ACUTE PT VISIT: 1 Visit                    Jetta Lout PTA 01/05/24, 8:28 AM

## 2024-01-05 NOTE — Plan of Care (Signed)

## 2024-01-05 NOTE — Progress Notes (Signed)
Telephone call to [facility] @ [phone], spoke to [facility] concerning the Appeal determination. She stated that she will reach out to [facility] for more information, awaiting a callback.  Jeral Pinch Transition of Care Supervisor [phone]

## 2024-01-05 NOTE — TOC Progression Note (Signed)
Transition of Care [facility]) - Progression Note    Patient Details  Name: Todd Gonzales MRN: [id number] Date of Birth: October 27, 1941  Transition of Care [facility]) CM/SW Contact  Allena Katz, LCSW Phone Number: 01/05/2024, 9:06 AM  Clinical Narrative:  Appeal still pending.     Expected Discharge Plan: Skilled Nursing Facility Barriers to Discharge: Continued Medical Work up  Expected Discharge Plan and Services     Post Acute Care Choice: Skilled Nursing Facility Living arrangements for the past 2 months: Independent Living Facility Expected Discharge Date: 12/30/23                                     Social Determinants of Health (SDOH) Interventions SDOH Screenings   Food Insecurity: No Food Insecurity (12/22/2023)  Housing: Low Risk  (12/22/2023)  Transportation Needs: No Transportation Needs (12/22/2023)  Utilities: Not At Risk (12/22/2023)  Alcohol Screen: Low Risk  (08/08/2023)  Depression (PHQ2-9): Low Risk  (08/08/2023)  Financial Resource Strain: Low Risk  (08/08/2023)  Physical Activity: Inactive (08/08/2023)  Social Connections: Socially Integrated (12/22/2023)  Stress: No Stress Concern Present (08/08/2023)  Tobacco Use: Low Risk  (12/20/2023)  Health Literacy: Adequate Health Literacy (08/08/2023)    Readmission Risk Interventions    04/16/2023   11:32 AM  Readmission Risk Prevention Plan  Post Dischage Appt Complete  Medication Screening Complete  Transportation Screening Complete

## 2024-01-05 NOTE — TOC Progression Note (Signed)
Transition of Care [facility]) - Progression Note    Patient Details  Name: Todd Gonzales MRN: [id number] Date of Birth: Jun 09, 1941  Transition of Care [facility]) CM/SW Contact  Allena Katz, LCSW Phone Number: 01/05/2024, 4:01 PM  Clinical Narrative:   Appeal still pending as of 4:02pm.     Expected Discharge Plan: Skilled Nursing Facility Barriers to Discharge: Continued Medical Work up  Expected Discharge Plan and Services     Post Acute Care Choice: Skilled Nursing Facility Living arrangements for the past 2 months: Independent Living Facility Expected Discharge Date: 12/30/23                                     Social Determinants of Health (SDOH) Interventions SDOH Screenings   Food Insecurity: No Food Insecurity (12/22/2023)  Housing: Low Risk  (12/22/2023)  Transportation Needs: No Transportation Needs (12/22/2023)  Utilities: Not At Risk (12/22/2023)  Alcohol Screen: Low Risk  (08/08/2023)  Depression (PHQ2-9): Low Risk  (08/08/2023)  Financial Resource Strain: Low Risk  (08/08/2023)  Physical Activity: Inactive (08/08/2023)  Social Connections: Socially Integrated (12/22/2023)  Stress: No Stress Concern Present (08/08/2023)  Tobacco Use: Low Risk  (12/20/2023)  Health Literacy: Adequate Health Literacy (08/08/2023)    Readmission Risk Interventions    04/16/2023   11:32 AM  Readmission Risk Prevention Plan  Post Dischage Appt Complete  Medication Screening Complete  Transportation Screening Complete

## 2024-01-05 NOTE — Plan of Care (Signed)

## 2024-01-06 DIAGNOSIS — J9601 Acute respiratory failure with hypoxia: Secondary | ICD-10-CM | POA: Diagnosis not present

## 2024-01-06 MED ORDER — FAMOTIDINE 20 MG PO TABS
20.0000 mg | ORAL_TABLET | Freq: Two times a day (BID) | ORAL | Status: DC
  Administered 2024-01-06 – 2024-01-08 (×5): 20 mg via ORAL
  Filled 2024-01-06 (×5): qty 1

## 2024-01-06 NOTE — Progress Notes (Signed)
[facility] Progress Note  Patient: Todd Gonzales    GEX:[id number]  DOA: 12/20/2023     Date of Service: the patient was seen and examined on 01/06/2024  Chief Complaint  Patient presents with   Shortness of Breath   Brief hospital course: Todd Gonzales is a 83 y.o. male with medical history significant of morbid obesity, HFpEF, CAD, atrial fibrillation presenting with acute respiratory failure with hypoxia and hypercarbia, acute on chronic HFpEF.  Patient reports increased work of breathing over the past week or so.  Noted baseline HFpEF.  Reports intermittent compliance with Lasix.  Mild orthopnea PND as well as lower extremity swelling.  No chest pain.  No nausea or vomiting.  No focal hemiparesis or confusion.  Shortness of breath has become more pronounced.  Currently lives at assisted living facility.  Was redirected to the ER for worsening shortness of breath.  Noted to be satting at 82% on room air upon EMS arrival. Presented to ER afebrile, HR 140s-50s, BP stable. Initially in 80s on RA. Placed on BIPAP. VBG w/ hypercarbia. BNP 600s. Cr 1.2. T bili 1.6.   1/22: S/p DCCV and now in sinus rhythm.  Cardiology signed off on 1/18.  Discontinuing metoprolol due to borderline bradycardia.  Patient is also on amiodarone. Currently medically stable and awaiting insurance authorization for SNF.  1/23: Remained hemodynamically stable, still awaiting insurance authorization.  1/24: Still pending final decision from insurance, peer to peer was done and updated PT notes were given as requested.  1/26: Patient was discharged back to his independent facility with home health services yesterday when insurance declined SNF even after peer to peer.  Wife is very nervous and she appealed that decision.  1/27: Remained stable.  Appeal is still pending 1/28: Appeal still pending   Assessment and Plan:  # Acute respiratory failure with hypoxia and hypercapnia, Resolved  Decompensated resp failure  requiring BIPAP in setting of acute on chronic HFpEF  Noted decompensated resp acidosis on presentation  No noted prior hx/o tobacco abuse or obstructive lung disease  Suspect element of OHS/sleep apnea-noted large body habitus  D dimer  0.31 negative,Cont w/ BIPAP prn S/p Brovana nebulizer BID, d/c'd on 1/20, no more need Continue DuoNeb every 6 hourly as needed Patient needs sleep study and pulmonary follow-up as an outpatient for PFTs  # Acute on chronic heart failure with preserved ejection fraction (HFpEF) 2D ECHO 04/2023 w/ EF 60-65%  hypercarbic resp failure on presentation w/ volume overload  Noted baseline interimittent use of lasix at facility  BNP 625- well above baseline, LE edema resolved   CXR w/ cardiomegaly and vascular congestion preliminarily  S/p IV lasix  Cardiology consulted   # Atrial fibrillation with rapid ventricular response, Resolved s/p cardioversion done on 1/17 Initial HR into 150s, Improved to 50s s/p IV metoprolol x 1  continue eliquis  S/p digoxin 0.5 mg p.o. daily and S/p  Cardizem IV infusion Monitor HR w/ BB use  Cardiology consulted, sp TEE and cardioversion 1/17 Started amiodarone 400 mg p.o. twice daily for 7 days followed by 200 mg p.o. twice daily for 7 days followed by 20 mg p.o. daily as per cardiology 1/18 started metoprolol 25 mg p.o. twice daily as per cardio 1/21 bradycardia, decreased Lopressor 12.5 mg p.o. twice daily 1/22: Remained borderline bradycardic, discontinuing Lopressor  # Hypokalemia, potassium repleted.  # Coronary artery disease, non-occlusive Baseline CAD  No active chest pain  Monitor    # HTN (hypertension)  Monitor BP and titrate home regimen  Patient became hypotensive, received digoxin and Cardizem IV infusion as above 1/18 started metoprolol 25 mg p.o. twice daily as per cardio  # Iron-deficiency, transfer sat 8%, started oral iron supplement. # Vitamin D level 31.7, at lower end, started vitamin D 50k units  p.o. weekly. # Vitamin B12 level 378, goal <400, started B12 oral supplement.   Obesity class I Body mass index is 31.92 kg/m.  Interventions:  Diet: Heart healthy diet, fluid restriction 1.5 L/day DVT Prophylaxis: Therapeutic Anticoagulation with Eliquis    Advance goals of care discussion: Full code  Family Communication: daughter updated at bedside today  Disposition:  Pt is from ILF (independent living [facility] ), admitted with Resp failure and A.fib, s/p cardioversion done on 1/17.stable to discharge  Insurance declined SNF even with peer to peer. Wife appealed  Subjective:  Still no decision from insurance company.    Physical Exam:  Constitutional: NAD, AAOx3 HEENT: conjunctivae and lids normal, EOMI CV: No cyanosis.   RESP: normal respiratory effort, on RA Neuro: II - XII grossly intact.   Psych: Normal mood and affect.  Appropriate judgement and reason    Vitals:   01/05/24 2006 01/06/24 0900 01/06/24 1100 01/06/24 1609  BP: (!) 124/58 (!) 141/68 112/68 119/62  Pulse: (!) 57 (!) 57 (!) 57 (!) 56  Resp: 18 18 18 18   Temp: 99 F (37.2 C) 97.8 F (36.6 C) 98.1 F (36.7 C) 98 F (36.7 C)  TempSrc:  Oral Oral Oral  SpO2: 95% 95% 95% 96%  Weight:      Height:        Intake/Output Summary (Last 24 hours) at 01/06/2024 2016 Last data filed at 01/06/2024 1900 Gross per 24 hour  Intake 240 ml  Output 650 ml  Net -410 ml   Filed Weights   12/26/23 0600 12/27/23 0847 01/04/24 1725  Weight: 104.7 kg 104.5 kg 103.8 kg    Data Reviewed: Prior data reviewed  CBC: No results for input(s): "WBC", "NEUTROABS", "HGB", "HCT", "MCV", "PLT" in the last 168 hours.  Basic Metabolic Panel: No results for input(s): "NA", "K", "CL", "CO2", "GLUCOSE", "BUN", "CREATININE", "CALCIUM", "MG", "PHOS" in the last 168 hours.   Studies: No results found.   Scheduled Meds:  amiodarone  200 mg Oral Daily   apixaban  5 mg Oral BID   vitamin C  500 mg Oral Daily    bisacodyl  10 mg Oral QHS   vitamin B-12  500 mcg Oral Daily   famotidine  20 mg Oral BID   gabapentin  300 mg Oral QHS   hydrocortisone  1 Application Rectal BID   iron polysaccharides  150 mg Oral Daily   polyethylene glycol  17 g Oral BID   Vitamin D (Ergocalciferol)  50,000 Units Oral Q7 days   Continuous Infusions:   PRN Meds: bisacodyl, ipratropium-albuterol, ondansetron (ZOFRAN) IV, sodium chloride  Time spent: 25 minutes

## 2024-01-06 NOTE — Progress Notes (Signed)
Physical Therapy Treatment Patient Details Name: Todd Gonzales MRN: [id number] DOB: 02/03/1941 Today's Date: 01/06/2024   History of Present Illness Todd Gonzales is an 83 y.o. male history of CHF, hypertension, hyperlipidemia, atrial fibrillation, presenting with shortness of breath and recent history of chest pain.  Per EMS when they arrived pt was satting 86% on room air and was placed on 4 L nasal cannula with improvement to his oxygen status.  MD assessment includes: acute respiratory failure with hypoxia and hypercapnia, acute on chronic heart failure with preserved ejection fraction, atrial fibrillation with rapid ventricular response, and hypokalemia.    PT Comments  Patient asleep in recliner upon PT arrival, agreeable to PT tx session. Patient able to stand from recliner with CGA, and standing balance without UE support to use urinal approx 1 minute with supervision/CGA. Pt able to ambulate ~160 ft with RW with CGA, HR and Sp02 stable on RA throughout. Pt returned to recliner. Upon returning to recliner, urge for bowel movement therefore pt completed additional ambulation bout to toilet. Patient request to be left on toilet and will pull call light. RN/NT notified of patient positioning. Patient will continue to benefit from skilled acute PT services to address functional impairments (see below for additional) and maximize functional mobility. D/c recommendation remains appropriate. Will continue to follow acutely     If plan is discharge home, recommend the following: A little help with walking and/or transfers;A little help with bathing/dressing/bathroom;Assistance with cooking/housework;Assist for transportation   Can travel by private vehicle     No  Equipment Recommendations  None recommended by PT    Recommendations for Other Services       Precautions / Restrictions Precautions Precautions: Fall Precaution Comments: watch O2 Restrictions Weight Bearing Restrictions Per  Provider Order: No     Mobility  Bed Mobility               General bed mobility comments: not observed; patietn recieved in recliner    Transfers Overall transfer level: Needs assistance Equipment used: Rolling walker (2 wheels) Transfers: Sit to/from Stand Sit to Stand: Contact guard assist           General transfer comment: Pt able to stand from recliner, CGA to RW.    Ambulation/Gait Ambulation/Gait assistance: Contact guard assist Gait Distance (Feet): 160 Feet Assistive device: Rolling walker (2 wheels) Gait Pattern/deviations: Step-through pattern, Decreased step length - right, Decreased step length - left, Trunk flexed Gait velocity: Decreased     General Gait Details: Pt ambulate ~160 ft with RW, good stability overall, CGA . [id number] Stable. PLus additional ambulation bout of approx 15 ft from recliner > toilet. Pt request time to use bathroom, RN/NT notified.   Stairs             Wheelchair Mobility     Tilt Bed    Modified Rankin (Stroke Patients Only)       Balance Overall balance assessment: Needs assistance Sitting-balance support: Feet supported Sitting balance-Leahy Scale: Good     Standing balance support: Bilateral upper extremity supported, During functional activity, Reliant on assistive device for balance Standing balance-Leahy Scale: Fair Standing balance comment: increase reliance on RW for support                            Cognition Arousal: Alert Behavior During Therapy: WFL for tasks assessed/performed Overall Cognitive Status: Within Functional Limits for tasks assessed  General Comments: Agreeable to session; A&O        Exercises      General Comments        Pertinent Vitals/Pain Pain Assessment Pain Assessment: No/denies pain    Home Living                          Prior Function            PT Goals (current goals can now be  found in the care plan section) Acute Rehab PT Goals PT Goal Formulation: With patient Time For Goal Achievement: 01/20/24 Potential to Achieve Goals: Good Progress towards PT goals: Progressing toward goals    Frequency    Min 1X/week      PT Plan      Co-evaluation              AM-PAC PT "6 Clicks" Mobility   Outcome Measure  Help needed turning from your back to your side while in a flat bed without using bedrails?: None Help needed moving from lying on your back to sitting on the side of a flat bed without using bedrails?: A Little Help needed moving to and from a bed to a chair (including a wheelchair)?: A Little Help needed standing up from a chair using your arms (e.g., wheelchair or bedside chair)?: A Little Help needed to walk in hospital room?: A Little Help needed climbing 3-5 steps with a railing? : A Little 6 Click Score: 19    End of Session Equipment Utilized During Treatment: Gait belt Activity Tolerance: Patient tolerated treatment well;Patient limited by fatigue Patient left: Other (comment) (left on toilet per patient request; RN/NT notified.) Nurse Communication: Mobility status PT Visit Diagnosis: Other abnormalities of gait and mobility (R26.89);Muscle weakness (generalized) (M62.81);Difficulty in walking, not elsewhere classified (R26.2)     Time: 1610-9604 PT Time Calculation (min) (ACUTE ONLY): 16 min  Charges:    $Gait Training: 8-22 mins PT General Charges $$ ACUTE PT VISIT: 1 Visit                     Creed Copper Fairly, PT, DPT 01/06/24 12:28 PM

## 2024-01-07 DIAGNOSIS — J9601 Acute respiratory failure with hypoxia: Secondary | ICD-10-CM | POA: Diagnosis not present

## 2024-01-07 MED ORDER — ALUM & MAG HYDROXIDE-SIMETH 200-200-20 MG/5ML PO SUSP
30.0000 mL | Freq: Four times a day (QID) | ORAL | Status: DC | PRN
  Administered 2024-01-08: 30 mL via ORAL
  Filled 2024-01-07: qty 30

## 2024-01-07 MED ORDER — ACETAMINOPHEN 500 MG PO TABS
1000.0000 mg | ORAL_TABLET | Freq: Three times a day (TID) | ORAL | Status: DC | PRN
  Administered 2024-01-07: 1000 mg via ORAL
  Filled 2024-01-07: qty 2

## 2024-01-07 NOTE — Plan of Care (Signed)

## 2024-01-07 NOTE — Progress Notes (Signed)
Occupational Therapy Re-evaluation Patient Details Name: Todd Gonzales MRN: [id number] DOB: Aug 04, 1941 Today's Date: 01/07/2024   History of present illness Todd Gonzales is an 83 y.o. male history of CHF, hypertension, hyperlipidemia, atrial fibrillation, presenting with shortness of breath and recent history of chest pain.  Per EMS when they arrived pt was satting 86% on room air and was placed on 4 L nasal cannula with improvement to his oxygen status.  MD assessment includes: acute respiratory failure with hypoxia and hypercapnia, acute on chronic heart failure with preserved ejection fraction, atrial fibrillation with rapid ventricular response, and hypokalemia.   OT comments  Chart reviewed to date, pt greeted in chair with wife present, agreeable to OT tx session. Re-evaluation completed with goals upgraded to refect improvements in performance. Pt and wife both endorse pt continues to perform ADL/functional mobility below PLOF and are eager for him to continue to progress with therapy to facilitate return to PLOF. Improvements noted in bed mobility on this date, simulating home set up, with pt performing sit<>supine 2x with supervision, vcs for technique/use of rail. CGA required for toilet transfer, CGA for amb in hallway 50' with RW. Pt reports he does not use a RW at baseline, unsafe to remove RW to amb with quad cane (previous set up) OT on this date. Discussed recommendations, safe ADL completion with pt/wife. Pt is left in chair, all needs met. OT will continue to follow.       If plan is discharge home, recommend the following:  A little help with walking and/or transfers;Assistance with cooking/housework;Assist for transportation;A little help with bathing/dressing/bathroom   Equipment Recommendations  Tub/shower seat;Other (comment)    Recommendations for Other Services      Precautions / Restrictions Precautions Precautions: Fall Restrictions Weight Bearing Restrictions Per  Provider Order: No       Mobility Bed Mobility Overal bed mobility: Needs Assistance Bed Mobility: Supine to Sit, Sit to Supine     Supine to sit: Supervision, Used rails Sit to supine: Supervision, Used rails   General bed mobility comments: simulated home set up 2x- use of rails both times, pt/wife educated on use of bed rail at home for improved bed mobility performance    Transfers Overall transfer level: Needs assistance Equipment used: Rolling walker (2 wheels) Transfers: Sit to/from Stand Sit to Stand: Contact guard assist                 Balance Overall balance assessment: Needs assistance Sitting-balance support: Feet supported Sitting balance-Leahy Scale: Good     Standing balance support: Bilateral upper extremity supported, During functional activity, Reliant on assistive device for balance Standing balance-Leahy Scale: Fair                             ADL either performed or assessed with clinical judgement   ADL Overall ADL's : Needs assistance/impaired Eating/Feeding: Set up;Sitting   Grooming: Wash/dry hands;Standing;Supervision/safety Grooming Details (indicate cue type and reason): with RW at sink level         Upper Body Dressing : Supervision/safety;Sitting   Lower Body Dressing: Maximal assistance Lower Body Dressing Details (indicate cue type and reason): donn/doff socks Toilet Transfer: Contact guard assist;Rolling walker (2 wheels) Toilet Transfer Details (indicate cue type and reason): with home set up toilet riser brought into hospital Toileting- Clothing Manipulation and Hygiene: Supervision/safety;Sit to/from stand       Functional mobility during ADLs: Contact guard assist;Rolling walker (  2 wheels) (approx 50' with RW in hallway;)      Extremity/Trunk Assessment Upper Extremity Assessment Upper Extremity Assessment: LUE deficits/detail LUE Deficits / Details: decreased AROM L shoulder flexion; pt reports this is  baseline            Vision Patient Visual Report: No change from baseline     Perception     Praxis      Cognition Arousal: Alert Behavior During Therapy: WFL for tasks assessed/performed Overall Cognitive Status: Within Functional Limits for tasks assessed                                          Exercises Other Exercises Other Exercises: edu pt and wife re: role of OT, role of rehab, DME recommendations, safe ADL completion; pt and wife continue to be concerned pt is far from PLOF andwant to discharge to rehab    Shoulder Instructions       General Comments spo2 >90% on RA    Pertinent Vitals/ Pain       Pain Assessment Pain Assessment: No/denies pain  Home Living                                          Prior Functioning/Environment              Frequency  Min 1X/week        Progress Toward Goals  OT Goals(current goals can now be found in the care plan section)  Progress towards OT goals: Progressing toward goals  Acute Rehab OT Goals Patient Stated Goal: rehab to return to PLOF OT Goal Formulation: With patient/family Time For Goal Achievement: 01/21/24 Potential to Achieve Goals: Good ADL Goals Pt Will Perform Grooming: with modified independence;standing Pt Will Transfer to Toilet: with modified independence;ambulating;bedside commode Pt Will Perform Toileting - Clothing Manipulation and hygiene: with modified independence;sit to/from stand;sitting/lateral leans  Plan      Co-evaluation                 AM-PAC OT "6 Clicks" Daily Activity     Outcome Measure   Help from another person eating meals?: None Help from another person taking care of personal grooming?: None Help from another person toileting, which includes using toliet, bedpan, or urinal?: A Little Help from another person bathing (including washing, rinsing, drying)?: A Lot Help from another person to put on and taking off regular  upper body clothing?: A Little Help from another person to put on and taking off regular lower body clothing?: A Lot 6 Click Score: 18    End of Session Equipment Utilized During Treatment: Rolling walker (2 wheels)  OT Visit Diagnosis: Unsteadiness on feet (R26.81);Muscle weakness (generalized) (M62.81)   Activity Tolerance Patient tolerated treatment well   Patient Left in chair;with call bell/phone within reach   Nurse Communication Mobility status        Time: 5409-8119 OT Time Calculation (min): 22 min  Charges: OT General Charges $OT Visit: 1 Visit OT Evaluation $OT Re-eval: 1 Re-eval OT Treatments $Self Care/Home Management : 8-22 mins  Oleta Mouse, OTD OTR/L  01/07/24, 10:16 AM

## 2024-01-07 NOTE — TOC Progression Note (Addendum)
Transition of Care [facility]) - Progression Note    Patient Details  Name: Todd Gonzales MRN: [id number] Date of Birth: 06-20-1941  [facility]) CM/SW Contact  Bing Quarry, RN Phone Number: 01/07/2024, 9:54 AM  Clinical Narrative: 01/07/24: RN CM contacted Adria Dill at [place] and Denials Coordinator at [phone] regarding appeal status. Status was still showing pending. RN CM asked and relayed concerns from family regarding what is taking so long, is there more information needed, what about the 72 hours time frame quoted for appeal time, a letter was possible sent to [employer] office and a fax sent to Dr Midge Aver Amin's office last week, who is not on at [facility] this week.  The timeline of appeals, P2P, and another appeal reviewed as well. She could not see anything other than status pending but was going to take it to her supervisor to try to find out any other details. She has RN CM call back number and stated she would get back to me today.  Updated spouse on all concerns and questions addressed on their behalf with Ms Darcella Gasman and plan to call me back later today. Spouse verbalized understanding of this current situation.   1015 AM UPDATE: RN CM received return call from Ms Darcella Gasman and patient or spouse needs to contact the [employer] Plan directly at [phone] Option #2, as it seems a form "Appointment of Representation" may be something that is missing as spouse had CM assist with the appeal, confirmed with spouse when this information was given to her to call directly. She took down the number and information to ask about and RN CM gave her [employer] fax number if she needs to receive a form in that manner.   415 p,: Spouse contacted numbers given and the report she got, though she stated they tried to be helpful was that [facility] would need to check back Monday for the correct person that has the correct access to the information but they were also confused by some of the timeline and  notes from what they could access. TOC to call and ask for Kayla at [phone] M-F.   Updated provider. 412 pm.    Gabriel Cirri MSN RN CM  RN Case Manager [place]  Transitions of Care Direct Dial: [phone] (Weekends Only) [facility] Main Office Phone: [phone] [facility] Fax: [phone] [email or web address]     Expected Discharge Plan: Skilled Nursing Facility Barriers to Discharge: Continued Medical Work up  Expected Discharge Plan and Services     Post Acute Care Choice: Skilled Nursing Facility Living arrangements for the past 2 months: Independent Living Facility Expected Discharge Date: 12/30/23                                     Social Determinants of Health (SDOH) Interventions SDOH Screenings   Food Insecurity: No Food Insecurity (12/22/2023)  Housing: Low Risk  (12/22/2023)  Transportation Needs: No Transportation Needs (12/22/2023)  Utilities: Not At Risk (12/22/2023)  Alcohol Screen: Low Risk  (08/08/2023)  Depression (PHQ2-9): Low Risk  (08/08/2023)  Financial Resource Strain: Low Risk  (08/08/2023)  Physical Activity: Inactive (08/08/2023)  Social Connections: Socially Integrated (12/22/2023)  Stress: No Stress Concern Present (08/08/2023)  Tobacco Use: Low Risk  (12/20/2023)  Health Literacy: Adequate Health Literacy (08/08/2023)    Readmission Risk Interventions    04/16/2023   11:32 AM  Readmission Risk Prevention Plan  Post Dischage  Appt Complete  Medication Screening Complete  Transportation Screening Complete

## 2024-01-07 NOTE — Progress Notes (Signed)
[facility] Progress Note  Patient: Todd Gonzales    EAV:[id number]  DOA: 12/20/2023     Date of Service: the patient was seen and examined on 01/07/2024  Chief Complaint  Patient presents with   Shortness of Breath   Brief hospital course: Todd Gonzales is a 83 y.o. male with medical history significant of morbid obesity, HFpEF, CAD, atrial fibrillation presenting with acute respiratory failure with hypoxia and hypercarbia, acute on chronic HFpEF.  Patient reports increased work of breathing over the past week or so.  Noted baseline HFpEF.  Reports intermittent compliance with Lasix.  Mild orthopnea PND as well as lower extremity swelling.  No chest pain.  No nausea or vomiting.  No focal hemiparesis or confusion.  Shortness of breath has become more pronounced.  Currently lives at assisted living facility.  Was redirected to the ER for worsening shortness of breath.  Noted to be satting at 82% on room air upon EMS arrival. Presented to ER afebrile, HR 140s-50s, BP stable. Initially in 80s on RA. Placed on BIPAP. VBG w/ hypercarbia. BNP 600s. Cr 1.2. T bili 1.6.   1/22: S/p DCCV and now in sinus rhythm.  Cardiology signed off on 1/18.  Discontinuing metoprolol due to borderline bradycardia.  Patient is also on amiodarone. Currently medically stable and awaiting insurance authorization for SNF.  1/23: Remained hemodynamically stable, still awaiting insurance authorization.  1/24: Still pending final decision from insurance, peer to peer was done and updated PT notes were given as requested.  1/26: Patient was discharged back to his independent facility with home health services yesterday when insurance declined SNF even after peer to peer.  Wife is very nervous and she appealed that decision.  1/27: Remained stable.  Appeal is still pending 1/28: Appeal still pending   Assessment and Plan:  # Acute respiratory failure with hypoxia and hypercapnia, Resolved  Decompensated resp failure  requiring BIPAP in setting of acute on chronic HFpEF  Noted decompensated resp acidosis on presentation  No noted prior hx/o tobacco abuse or obstructive lung disease  Suspect element of OHS/sleep apnea-noted large body habitus  D dimer  0.31 negative,Cont w/ BIPAP prn S/p Brovana nebulizer BID, d/c'd on 1/20, no more need Continue DuoNeb every 6 hourly as needed Patient needs sleep study and pulmonary follow-up as an outpatient for PFTs  # Acute on chronic heart failure with preserved ejection fraction (HFpEF) 2D ECHO 04/2023 w/ EF 60-65%  hypercarbic resp failure on presentation w/ volume overload  Noted baseline interimittent use of lasix at facility  BNP 625- well above baseline, LE edema resolved   CXR w/ cardiomegaly and vascular congestion preliminarily  S/p IV lasix  Cardiology consulted   # Atrial fibrillation with rapid ventricular response, Resolved s/p cardioversion done on 1/17 Initial HR into 150s, Improved to 50s s/p IV metoprolol x 1  continue eliquis  S/p digoxin 0.5 mg p.o. daily and S/p  Cardizem IV infusion Monitor HR w/ BB use  Cardiology consulted, sp TEE and cardioversion 1/17 Started amiodarone 400 mg p.o. twice daily for 7 days followed by 200 mg p.o. twice daily for 7 days followed by 20 mg p.o. daily as per cardiology 1/18 started metoprolol 25 mg p.o. twice daily as per cardio 1/21 bradycardia, decreased Lopressor 12.5 mg p.o. twice daily 1/22: Remained borderline bradycardic, discontinuing Lopressor  # Hypokalemia, potassium repleted.  # Coronary artery disease, non-occlusive Baseline CAD  No active chest pain  Monitor    # HTN (hypertension)  Monitor BP and titrate home regimen  Patient became hypotensive, received digoxin and Cardizem IV infusion as above 1/18 started metoprolol 25 mg p.o. twice daily as per cardio  # Iron-deficiency, transfer sat 8%, started oral iron supplement. # Vitamin D level 31.7, at lower end, started vitamin D 50k units  p.o. weekly. # Vitamin B12 level 378, goal <400, started B12 oral supplement.   Obesity class I Body mass index is 31.92 kg/m.  Interventions:  Diet: Heart healthy diet, fluid restriction 1.5 L/day DVT Prophylaxis: Therapeutic Anticoagulation with Eliquis    Advance goals of care discussion: Full code  Family Communication: daughter updated at bedside today  Disposition:  Pt is from ILF (independent living [facility] ), admitted with Resp failure and A.fib, s/p cardioversion done on 1/17.stable to discharge  Insurance declined SNF even with peer to peer. Wife appealed  Subjective:  Pt was frustrated with the delay in insurance decision to the appeal.     Physical Exam:  Constitutional: NAD, AAOx3 HEENT: conjunctivae and lids normal, EOMI CV: No cyanosis.   RESP: normal respiratory effort, on RA Neuro: II - XII grossly intact.     Vitals:   01/06/24 2050 01/07/24 0437 01/07/24 0852 01/07/24 1800  BP: 136/60 125/60 (!) 142/65 (!) 148/78  Pulse: (!) 52 (!) 58 (!) 56 61  Resp: 16 16 19 18   Temp: 98.9 F (37.2 C) 97.7 F (36.5 C) 98.8 F (37.1 C) 98 F (36.7 C)  TempSrc:    Oral  SpO2: 99% 95% 95% 98%  Weight:      Height:        Intake/Output Summary (Last 24 hours) at 01/07/2024 1843 Last data filed at 01/07/2024 1300 Gross per 24 hour  Intake 480 ml  Output 600 ml  Net -120 ml   Filed Weights   12/26/23 0600 12/27/23 0847 01/04/24 1725  Weight: 104.7 kg 104.5 kg 103.8 kg    Data Reviewed: Prior data reviewed  CBC: No results for input(s): "WBC", "NEUTROABS", "HGB", "HCT", "MCV", "PLT" in the last 168 hours.  Basic Metabolic Panel: No results for input(s): "NA", "K", "CL", "CO2", "GLUCOSE", "BUN", "CREATININE", "CALCIUM", "MG", "PHOS" in the last 168 hours.   Studies: No results found.   Scheduled Meds:  amiodarone  200 mg Oral Daily   apixaban  5 mg Oral BID   vitamin C  500 mg Oral Daily   bisacodyl  10 mg Oral QHS   vitamin B-12  500 mcg  Oral Daily   famotidine  20 mg Oral BID   gabapentin  300 mg Oral QHS   hydrocortisone  1 Application Rectal BID   iron polysaccharides  150 mg Oral Daily   polyethylene glycol  17 g Oral BID   Vitamin D (Ergocalciferol)  50,000 Units Oral Q7 days   Continuous Infusions:   PRN Meds: acetaminophen, bisacodyl, ipratropium-albuterol, ondansetron (ZOFRAN) IV, sodium chloride  Time spent: 25 minutes

## 2024-01-08 ENCOUNTER — Encounter: Payer: Self-pay | Admitting: Family Medicine

## 2024-01-08 ENCOUNTER — Encounter: Payer: Self-pay | Admitting: Cardiology

## 2024-01-08 DIAGNOSIS — J9601 Acute respiratory failure with hypoxia: Secondary | ICD-10-CM | POA: Diagnosis not present

## 2024-01-08 MED ORDER — HYDROCORTISONE (PERIANAL) 2.5 % EX CREA: 1.0000 | TOPICAL_CREAM | Freq: Two times a day (BID) | CUTANEOUS | Status: DC | PRN

## 2024-01-08 NOTE — TOC Transition Note (Signed)
Transition of Care [facility]) - Discharge Note   Patient Details  Name: Todd Gonzales MRN: [id number] Date of Birth: Jan 03, 1941  Transition of Care [facility]) CM/SW Contact:  Allena Katz, LCSW Phone Number: 01/08/2024, 11:34 AM   Clinical Narrative:   Pt discharging home with [facility]. [place] notified. Wife does not need any additional dme as she states pt has all he needs at home currently.    Final next level of care: Home w Home Health Services Barriers to Discharge: Barriers Resolved   Patient Goals and CMS Choice   CMS Medicare.gov Compare Post Acute Care list provided to:: Patient Choice offered to / list presented to : Patient      Discharge Placement                       Discharge Plan and Services Additional resources added to the After Visit Summary for       Post Acute Care Choice: Skilled Nursing Facility                    HH Arranged: PT, OT, RN, Nurse's Aide [facility] Agency: [facility] Date [facility] Agency Contacted: 01/08/24   Representative spoke with at [facility] Agency: [email or web address]  Social Drivers of Health (SDOH) Interventions SDOH Screenings   Food Insecurity: No Food Insecurity (12/22/2023)  Housing: Low Risk  (12/22/2023)  Transportation Needs: No Transportation Needs (12/22/2023)  Utilities: Not At Risk (12/22/2023)  Alcohol Screen: Low Risk  (08/08/2023)  Depression (PHQ2-9): Low Risk  (08/08/2023)  Financial Resource Strain: Low Risk  (08/08/2023)  Physical Activity: Inactive (08/08/2023)  Social Connections: Socially Integrated (12/22/2023)  Stress: No Stress Concern Present (08/08/2023)  Tobacco Use: Low Risk  (12/20/2023)  Health Literacy: Adequate Health Literacy (08/08/2023)     Readmission Risk Interventions    04/16/2023   11:32 AM  Readmission Risk Prevention Plan  Post Dischage Appt Complete  Medication Screening Complete  Transportation Screening Complete

## 2024-01-08 NOTE — Progress Notes (Signed)
PT Cancellation Note  Patient Details Name: Todd Gonzales MRN: [id number] DOB: Jul 17, 1941   Cancelled Treatment:    Reason Eval/Treat Not Completed: Other (comment): Chart Reviewed. Patient in room with wife at time of PT arrival. Refuses to participate in therapy services this date/time due to getting dressed/packed up. Wife tells this Thereasa Parkin she has decided she would for patient to go home with Home Health as soon as possible. TOC notified.    Howie Ill, PT, DPT 01/08/24 11:36 AM

## 2024-01-08 NOTE — TOC Progression Note (Signed)
Transition of Care [facility]) - Progression Note    Patient Details  Name: Todd Gonzales MRN: [id number] Date of Birth: Aug 02, 1941  Transition of Care [facility]) CM/SW Contact  Allena Katz, LCSW Phone Number: 01/08/2024, 9:49 AM  Clinical Narrative:   CSW spoke with navi who states the appeal is still pending. CSW asked if any additional documentation was needed. CSW transferred to different department as previous dept could not answer left on hold for 30 minutes. CSW will try again later.    Expected Discharge Plan: Skilled Nursing Facility Barriers to Discharge: Continued Medical Work up  Expected Discharge Plan and Services     Post Acute Care Choice: Skilled Nursing Facility Living arrangements for the past 2 months: Independent Living Facility Expected Discharge Date: 12/30/23                                     Social Determinants of Health (SDOH) Interventions SDOH Screenings   Food Insecurity: No Food Insecurity (12/22/2023)  Housing: Low Risk  (12/22/2023)  Transportation Needs: No Transportation Needs (12/22/2023)  Utilities: Not At Risk (12/22/2023)  Alcohol Screen: Low Risk  (08/08/2023)  Depression (PHQ2-9): Low Risk  (08/08/2023)  Financial Resource Strain: Low Risk  (08/08/2023)  Physical Activity: Inactive (08/08/2023)  Social Connections: Socially Integrated (12/22/2023)  Stress: No Stress Concern Present (08/08/2023)  Tobacco Use: Low Risk  (12/20/2023)  Health Literacy: Adequate Health Literacy (08/08/2023)    Readmission Risk Interventions    04/16/2023   11:32 AM  Readmission Risk Prevention Plan  Post Dischage Appt Complete  Medication Screening Complete  Transportation Screening Complete

## 2024-01-08 NOTE — TOC Progression Note (Signed)
Transition of Care [facility]) - Progression Note    Patient Details  Name: Todd Gonzales MRN: [id number] Date of Birth: 06-08-1941  Transition of Care [facility]) CM/SW Contact  Allena Katz, LCSW Phone Number: 01/08/2024, 11:33 AM  Clinical Narrative:     Wife reports she would like to take pt home with [facility]. Wife given choice on agency she wanted to use whoever took their insurance and had an Engineer, production. Referral given to [facility] for PT/OT/RN/AID   Expected Discharge Plan: Skilled Nursing Facility Barriers to Discharge: Continued Medical Work up  Expected Discharge Plan and Services     Post Acute Care Choice: Skilled Nursing Facility Living arrangements for the past 2 months: Independent Living Facility Expected Discharge Date: 12/30/23                                     Social Determinants of Health (SDOH) Interventions SDOH Screenings   Food Insecurity: No Food Insecurity (12/22/2023)  Housing: Low Risk  (12/22/2023)  Transportation Needs: No Transportation Needs (12/22/2023)  Utilities: Not At Risk (12/22/2023)  Alcohol Screen: Low Risk  (08/08/2023)  Depression (PHQ2-9): Low Risk  (08/08/2023)  Financial Resource Strain: Low Risk  (08/08/2023)  Physical Activity: Inactive (08/08/2023)  Social Connections: Socially Integrated (12/22/2023)  Stress: No Stress Concern Present (08/08/2023)  Tobacco Use: Low Risk  (12/20/2023)  Health Literacy: Adequate Health Literacy (08/08/2023)    Readmission Risk Interventions    04/16/2023   11:32 AM  Readmission Risk Prevention Plan  Post Dischage Appt Complete  Medication Screening Complete  Transportation Screening Complete

## 2024-01-08 NOTE — Plan of Care (Signed)
  Problem: Clinical Measurements: Goal: Will remain free from infection Outcome: Progressing   Problem: Activity: Goal: Risk for activity intolerance will decrease Outcome: Progressing   Problem: Nutrition: Goal: Adequate nutrition will be maintained Outcome: Progressing   Problem: Coping: Goal: Level of anxiety will decrease Outcome: Progressing   Problem: Elimination: Goal: Will not experience complications related to bowel motility Outcome: Progressing Goal: Will not experience complications related to urinary retention Outcome: Progressing   Problem: Pain Managment: Goal: General experience of comfort will improve and/or be controlled Outcome: Progressing   Problem: Safety: Goal: Ability to remain free from injury will improve Outcome: Progressing   Problem: Skin Integrity: Goal: Risk for impaired skin integrity will decrease Outcome: Progressing

## 2024-01-08 NOTE — Discharge Summary (Incomplete)
Physician Discharge Summary   ABDULLAHI Gonzales  male DOB: 1940/12/11  ZOX:[id number]  PCP: Joaquim Nam, MD  Admit date: 12/20/2023 Discharge date: ***  Admitted From: *** Disposition:  home Home Health: Yes CODE STATUS: Full code  Discharge Instructions     Diet - low sodium heart healthy   Complete by: As directed    Discharge instructions   Complete by: As directed    Your cardiologist made some changes to your medications, based on your heart rate you will now start taking amiodarone just once daily.  You will stay on a blood thinner Eliquis. You have been started on few new supplements which include iron and high-dose vitamin D as your levels were low. - -   Increase activity slowly   Complete by: As directed       Hospital Course:  For full details, please see H&P, progress notes, consult notes and ancillary notes.  Briefly,  ***  Unless noted above, medications under "STOP" list are ones pt was not taking PTA.  Discharge Diagnoses:  Principal Problem:   Acute respiratory failure with hypoxia (HCC) Active Problems:   Acute on chronic respiratory failure with hypoxia and hypercapnia (HCC)   Acute on chronic congestive heart failure (HCC)   Atrial fibrillation with rapid ventricular response (HCC)   HTN (hypertension)   SOB (shortness of breath)   Coronary artery disease, non-occlusive   Hypoxia   Respiratory acidosis   Pressure injury of left buttock, unstageable (HCC)   30 Day Unplanned Readmission Risk Score    Flowsheet Row ED to Hosp-Admission (Current) from 12/20/2023 in [facility] 1C MEDICAL TELEMETRY  30 Day Unplanned Readmission Risk Score (%) 14.34 Filed at 01/08/2024 1201       This score is the patient's risk of an unplanned readmission within 30 days of being discharged (0 -100%). The score is based on dignosis, age, lab data, medications, orders, and past utilization.   Low:  0-14.9   Medium: 15-21.9   High: 22-29.9    Extreme: 30 and above         Discharge Instructions:  Allergies as of 01/08/2024       Reactions   Codeine Other (See Comments)   Unknown reaction   Lipitor [atorvastatin] Other (See Comments)   Arthralgias    Bactrim [sulfamethoxazole-trimethoprim] Rash        Medication List     STOP taking these medications    metoprolol tartrate 25 MG tablet Commonly known as: LOPRESSOR   VITAMIN D-3 PO       TAKE these medications    acetaminophen 650 MG CR tablet Commonly known as: TYLENOL Take 1,300 mg by mouth 2 (two) times daily as needed for pain.   amiodarone 200 MG tablet Commonly known as: Pacerone Take 1 tablet (200 mg total) by mouth daily.   ascorbic acid 500 MG tablet Commonly known as: VITAMIN C Take 1 tablet (500 mg total) by mouth daily.   cyanocobalamin 1000 MCG/ML injection Commonly known as: VITAMIN B12 Inject 1 mL (1,000 mcg total) into the muscle every 30 (thirty) days.   diclofenac Sodium 1 % Gel Commonly known as: VOLTAREN Apply 1 Application topically every 6 (six) hours as needed (pain).   Eliquis 5 MG Tabs tablet Generic drug: apixaban TAKE 1 TABLET BY MOUTH TWICE A DAY   famotidine 20 MG tablet Commonly known as: Pepcid Take 1 tablet (20 mg total) by mouth 2 (two) times daily.  furosemide 20 MG tablet Commonly known as: LASIX Take 1 tablet (20 mg total) by mouth daily as needed for fluid.   gabapentin 300 MG capsule Commonly known as: NEURONTIN TAKE ONE CAPSULE BY MOUTH ONCE DAILY   hydrocortisone 2.5 % rectal cream Commonly known as: Proctosol HC Place 1 Application rectally 2 (two) times daily as needed for hemorrhoids or anal itching. Home med. What changed:  when to take this reasons to take this additional instructions   iron polysaccharides 150 MG capsule Commonly known as: NIFEREX Take 1 capsule (150 mg total) by mouth daily.   mometasone 0.1 % cream Commonly known as: ELOCON Spot treat thicker areas on lower  legs once to twice daily until improved. Avoid face, groin, axilla.   ondansetron 4 MG tablet Commonly known as: ZOFRAN TAKE ONE TABLET (4 MG TOTAL) BY MOUTH EVERY EIGHT (EIGHT) HOURS AS NEEDED FOR NAUSEA OR VOMITING.   OVER THE COUNTER MEDICATION Place 1-2 sprays into the nose daily as needed (allergies, nasal congestion). Unknown nasal spray.   polyethylene glycol 17 g packet Commonly known as: MIRALAX / GLYCOLAX Take 17 g by mouth daily as needed for mild constipation.   SALONPAS EX Apply 1 patch topically daily as needed (pain).   tamsulosin 0.4 MG Caps capsule Commonly known as: FLOMAX TAKE TWO CAPSULE BY MOUTH DAILY   Vitamin D (Ergocalciferol) 1.25 MG (50000 UNIT) Caps capsule Commonly known as: DRISDOL Take 1 capsule (50,000 Units total) by mouth every 7 (seven) days.         Contact information for follow-up providers     Vida Rigger, MD Follow up in 1 week(s).   Specialty: Pulmonary Disease Why: for PFTs and sleep study for OSA Contact information: [address] [place] [address] [phone]         Joaquim Nam, MD Follow up in 1 week(s).   Specialty: Family Medicine Contact information: [address] [place] [address] [phone]              Contact information for after-discharge care     Destination     [facility] .   Service: Skilled Nursing Contact information: [address] [place] [address] [phone]                     Allergies  Allergen Reactions   Codeine Other (See Comments)    Unknown reaction   Lipitor [Atorvastatin] Other (See Comments)    Arthralgias    Bactrim [Sulfamethoxazole-Trimethoprim] Rash     The results of significant diagnostics from this hospitalization (including imaging, microbiology, ancillary and laboratory) are listed below for reference.   Consultations:   Procedures/Studies: ECHO TEE Result Date:  12/22/2023    TRANSESOPHOGEAL ECHO REPORT   Patient Name:   Todd Gonzales Date of Exam: 12/22/2023 Medical Rec #:  [id number]     Height:       70.0 in Accession #:    [id number]    Weight:       240.0 lb Date of Birth:  09/12/1941     BSA:          2.255 m Patient Age:    82 years      BP:           133/87 mmHg Patient Gender: M             HR:  137 bpm. Exam Location:  [facility] Procedure: Transesophageal Echo, Cardiac Doppler and Color Doppler Indications:     Atrial Fibrillation I48.91  History:         Patient has prior history of Echocardiogram examinations, most                  recent 12/21/2023. Risk Factors:Hypertension and Dyslipidemia.                  Paroxysmal Afib.  Sonographer:     Cristela Blue Referring Phys:  [id number] Debbe Odea Diagnosing Phys: Debbe Odea MD PROCEDURE: The transesophogeal probe was passed without difficulty through the esophogus of the patient. Sedation performed by different physician. The patient developed no complications during the procedure. A successful direct current cardioversion was  performed at 200 joules with 1 attempt.  IMPRESSIONS  1. Left ventricular ejection fraction, by estimation, is 60 to 65%. The left ventricle has normal function.  2. Right ventricular systolic function is normal. The right ventricular size is normal.  3. Left atrial size was mildly dilated. No left atrial/left atrial appendage thrombus was detected.  4. The mitral valve is normal in structure. Mild to moderate mitral valve regurgitation.  5. The aortic valve is tricuspid. Aortic valve regurgitation is mild. FINDINGS  Left Ventricle: Left ventricular ejection fraction, by estimation, is 60 to 65%. The left ventricle has normal function. The left ventricular internal cavity size was normal in size. Right Ventricle: The right ventricular size is normal. No increase in right ventricular wall thickness. Right ventricular systolic function is normal. Left Atrium: Left atrial size was  mildly dilated. No left atrial/left atrial appendage thrombus was detected. Right Atrium: Right atrial size was normal in size. Pericardium: There is no evidence of pericardial effusion. Mitral Valve: The mitral valve is normal in structure. Mild to moderate mitral valve regurgitation. Tricuspid Valve: The tricuspid valve is normal in structure. Tricuspid valve regurgitation is mild. Aortic Valve: The aortic valve is tricuspid. Aortic valve regurgitation is mild. Pulmonic Valve: The pulmonic valve was normal in structure. Pulmonic valve regurgitation is mild. Aorta: The aortic root is normal in size and structure. IAS/Shunts: No atrial level shunt detected by color flow Doppler. Debbe Odea MD Electronically signed by Debbe Odea MD Signature Date/Time: 12/22/2023/1:33:28 PM    Final    ECHOCARDIOGRAM COMPLETE Result Date: 12/21/2023    ECHOCARDIOGRAM REPORT   Patient Name:   Todd Gonzales Date of Exam: 12/21/2023 Medical Rec #:  [id number]     Height:       70.0 in Accession #:    [id number]    Weight:       240.0 lb Date of Birth:  [birth year]     BSA:          2.255 m Patient Age:    82 years      BP:           126/88 mmHg Patient Gender: M             HR:           99 bpm. Exam Location:  [facility] Procedure: 2D Echo, Cardiac Doppler and Color Doppler Indications:     Atrial Fibrillaiton  History:         Patient has prior history of Echocardiogram examinations, most                  recent 04/15/2023. CHF, CAD, Arrythmias:Atrial Fibrillation,  Atrial Flutter and PVC, Signs/Symptoms:Fatigue and Shortness of                  Breath; Risk Factors:Hypertension and Dyslipidemia.  Sonographer:     Mikki Harbor Referring Phys:  [id number] [facility] CAREY Diagnosing Phys: Julien Nordmann MD  Sonographer Comments: Technically difficult study due to poor echo windows and patient is obese. IMPRESSIONS  1. Left ventricular ejection fraction, by estimation, is 60 to 65%. The left ventricle has normal  function. The left ventricle has no regional wall motion abnormalities. There is mild left ventricular hypertrophy. Left ventricular diastolic parameters are indeterminate.  2. Right ventricular systolic function is normal. The right ventricular size is normal. There is normal pulmonary artery systolic pressure. The estimated right ventricular systolic pressure is 29.9 mmHg.  3. The mitral valve is normal in structure. No evidence of mitral valve regurgitation. No evidence of mitral stenosis. Moderate mitral annular calcification.  4. The aortic valve is normal in structure. Aortic valve regurgitation is not visualized. No aortic stenosis is present.  5. The inferior vena cava is normal in size with greater than 50% respiratory variability, suggesting right atrial pressure of 3 mmHg. FINDINGS  Left Ventricle: Left ventricular ejection fraction, by estimation, is 60 to 65%. The left ventricle has normal function. The left ventricle has no regional wall motion abnormalities. The left ventricular internal cavity size was normal in size. There is  mild left ventricular hypertrophy. Left ventricular diastolic parameters are indeterminate. Right Ventricle: The right ventricular size is normal. No increase in right ventricular wall thickness. Right ventricular systolic function is normal. There is normal pulmonary artery systolic pressure. The tricuspid regurgitant velocity is 2.34 m/s, and  with an assumed right atrial pressure of 8 mmHg, the estimated right ventricular systolic pressure is 29.9 mmHg. Left Atrium: Left atrial size was normal in size. Right Atrium: Right atrial size was normal in size. Pericardium: There is no evidence of pericardial effusion. Mitral Valve: The mitral valve is normal in structure. Moderate mitral annular calcification. No evidence of mitral valve regurgitation. No evidence of mitral valve stenosis. MV peak gradient, 6.1 mmHg. The mean mitral valve gradient is 2.0 mmHg. Tricuspid Valve: The  tricuspid valve is normal in structure. Tricuspid valve regurgitation is not demonstrated. No evidence of tricuspid stenosis. Aortic Valve: The aortic valve is normal in structure. Aortic valve regurgitation is not visualized. No aortic stenosis is present. Aortic valve mean gradient measures 7.0 mmHg. Aortic valve peak gradient measures 13.9 mmHg. Aortic valve area, by VTI measures 1.55 cm. Pulmonic Valve: The pulmonic valve was normal in structure. Pulmonic valve regurgitation is not visualized. No evidence of pulmonic stenosis. Aorta: The aortic root is normal in size and structure. Venous: The inferior vena cava is normal in size with greater than 50% respiratory variability, suggesting right atrial pressure of 3 mmHg. IAS/Shunts: No atrial level shunt detected by color flow Doppler.  LEFT VENTRICLE PLAX 2D LVIDd:         4.10 cm LVIDs:         2.70 cm LV PW:         1.30 cm LV IVS:        1.20 cm LVOT diam:     2.00 cm LV SV:         50 LV SV Index:   22 LVOT Area:     3.14 cm  RIGHT VENTRICLE RV Basal diam:  3.57 cm RV Mid diam:    3.40 cm LEFT  ATRIUM             Index        RIGHT ATRIUM           Index LA diam:        4.10 cm 1.82 cm/m   RA Area:     21.30 cm LA Vol (A2C):   77.2 ml 34.23 ml/m  RA Volume:   64.50 ml  28.60 ml/m LA Vol (A4C):   36.9 ml 16.36 ml/m LA Biplane Vol: 56.4 ml 25.01 ml/m  AORTIC VALVE                     PULMONIC VALVE AV Area (Vmax):    1.66 cm      PV Vmax:       0.89 m/s AV Area (Vmean):   1.44 cm      PV Peak grad:  3.1 mmHg AV Area (VTI):     1.55 cm AV Vmax:           186.67 cm/s AV Vmean:          117.667 cm/s AV VTI:            0.322 m AV Peak Grad:      13.9 mmHg AV Mean Grad:      7.0 mmHg LVOT Vmax:         98.75 cm/s LVOT Vmean:        53.750 cm/s LVOT VTI:          0.158 m LVOT/AV VTI ratio: 0.49  AORTA Ao Root diam: 3.20 cm MITRAL VALVE               TRICUSPID VALVE MV Area (PHT): 4.26 cm    TR Peak grad:   21.9 mmHg MV Area VTI:   1.86 cm    TR Vmax:         234.00 cm/s MV Peak grad:  6.1 mmHg MV Mean grad:  2.0 mmHg    SHUNTS MV Vmax:       1.23 m/s    Systemic VTI:  0.16 m MV Vmean:      68.6 cm/s   Systemic Diam: 2.00 cm MV Decel Time: 178 msec MV E velocity: 92.80 cm/s Julien Nordmann MD Electronically signed by Julien Nordmann MD Signature Date/Time: 12/21/2023/3:19:37 PM    Final    DG Chest 2 View Result Date: 12/20/2023 CLINICAL DATA:  Shortness of breath. EXAM: CHEST - 2 VIEW COMPARISON:  July 28, 2023. FINDINGS: Stable cardiomediastinal silhouette. Hypoinflation of the lungs is noted with minimal bibasilar subsegmental atelectasis. Bony thorax is unremarkable. IMPRESSION: Hypoinflation of the lungs with minimal bibasilar subsegmental atelectasis. Electronically Signed   By: Lupita Raider M.D.   On: 12/20/2023 13:55      Labs: BNP (last 3 results) Recent Labs    04/14/23 1413 12/20/23 1122  BNP 229.7* 625.5*   Basic Metabolic Panel: No results for input(s): "NA", "K", "CL", "CO2", "GLUCOSE", "BUN", "CREATININE", "CALCIUM", "MG", "PHOS" in the last 168 hours. Liver Function Tests: No results for input(s): "AST", "ALT", "ALKPHOS", "BILITOT", "PROT", "ALBUMIN" in the last 168 hours. No results for input(s): "LIPASE", "AMYLASE" in the last 168 hours. No results for input(s): "AMMONIA" in the last 168 hours. CBC: No results for input(s): "WBC", "NEUTROABS", "HGB", "HCT", "MCV", "PLT" in the last 168 hours. Cardiac Enzymes: No results for input(s): "CKTOTAL", "CKMB", "CKMBINDEX", "TROPONINI" in the last 168 hours. BNP: Invalid input(s): "POCBNP" CBG: No  results for input(s): "GLUCAP" in the last 168 hours. D-Dimer No results for input(s): "DDIMER" in the last 72 hours. Hgb A1c No results for input(s): "HGBA1C" in the last 72 hours. Lipid Profile No results for input(s): "CHOL", "HDL", "LDLCALC", "TRIG", "CHOLHDL", "LDLDIRECT" in the last 72 hours. Thyroid function studies No results for input(s): "TSH", "T4TOTAL", "T3FREE",  "THYROIDAB" in the last 72 hours.  Invalid input(s): "FREET3" Anemia work up No results for input(s): "VITAMINB12", "FOLATE", "FERRITIN", "TIBC", "IRON", "RETICCTPCT" in the last 72 hours. Urinalysis    Component Value Date/Time   COLORURINE YELLOW 10/31/2023 1034   APPEARANCEUR CLEAR 10/31/2023 1034   APPEARANCEUR Hazy 06/11/2013 1519   LABSPEC 1.010 10/31/2023 1034   LABSPEC 1.015 06/11/2013 1519   PHURINE 6.0 10/31/2023 1034   GLUCOSEU NEGATIVE 10/31/2023 1034   HGBUR NEGATIVE 10/31/2023 1034   BILIRUBINUR NEGATIVE 10/31/2023 1034   BILIRUBINUR neg 07/05/2023 1405   BILIRUBINUR Negative 06/11/2013 1519   KETONESUR NEGATIVE 10/31/2023 1034   PROTEINUR Positive (A) 07/05/2023 1405   PROTEINUR 30 (A) 04/14/2023 1729   UROBILINOGEN 0.2 10/31/2023 1034   NITRITE NEGATIVE 10/31/2023 1034   LEUKOCYTESUR NEGATIVE 10/31/2023 1034   LEUKOCYTESUR 3+ 06/11/2013 1519   Sepsis Labs No results for input(s): "WBC" in the last 168 hours.  Invalid input(s): "PROCALCITONIN", "LACTICIDVEN" Microbiology No results found for this or any previous visit (from the past 240 hours).   Total time spend on discharging this patient, including the last patient exam, discussing the hospital stay, instructions for ongoing care as it relates to all pertinent caregivers, as well as preparing the medical discharge records, prescriptions, and/or referrals as applicable, is *** minutes.    Darlin Priestly, MD  [facility] 01/08/2024, 12:22 PM

## 2024-01-09 ENCOUNTER — Telehealth: Payer: Self-pay

## 2024-01-09 NOTE — Transitions of Care (Post Inpatient/ED Visit) (Signed)
   01/09/2024  Name: Todd Gonzales MRN: [id number] DOB: Apr 14, 1941  Today's TOC FU Call Status: Today's TOC FU Call Status:: Unsuccessful Call (1st Attempt) Unsuccessful Call (1st Attempt) Date: 01/09/24  Attempted to reach the patient regarding the most recent Inpatient/ED visit.  Follow Up Plan: Additional outreach attempts will be made to reach the patient to complete the Transitions of Care (Post Inpatient/ED visit) call.   [name] Balboa, BSN, RN [place]  VBCI - [employer] [facility] RN Care Manager [phone]

## 2024-01-09 NOTE — Transitions of Care (Post Inpatient/ED Visit) (Signed)
 01/09/2024  Name: Todd Gonzales MRN: [id number] DOB: [birth year]  Today's TOC FU Call Status: Today's TOC FU Call Status:: Successful TOC FU Call Completed TOC FU Call Complete Date: 01/09/24 Patient's Name and Date of Birth confirmed.  Transition Care Management Follow-up Telephone Call Date of Discharge: 01/08/24 Discharge Facility: [facility]) Type of Discharge: Inpatient Admission Primary Inpatient Discharge Diagnosis:: A-fib, CHF How have you been since you were released from the hospital?: Better Any questions or concerns?: Yes Patient Questions/Concerns:: Medication question Patient Questions/Concerns Addressed: Other: (Instructed the patient to follow the AVS until seen by the provider)  Items Reviewed: Did you receive and understand the discharge instructions provided?: Yes Medications obtained,verified, and reconciled?: Yes (Medications Reviewed) Any new allergies since your discharge?: No Dietary orders reviewed?: Yes Type of Diet Ordered:: Low sodium Heart Healthy Do you have support at home?: Yes People in Home: spouse Name of Support/Comfort Primary Source: [place], spouse  Medications Reviewed Today: Medications Reviewed Today     Reviewed by Moises Reusing, RN (Case Manager) on 01/09/24 at 1416  Med List Status: <None>   Medication Order Taking? Sig Documenting Provider Last Dose Status Informant  acetaminophen  (TYLENOL ) 650 MG CR tablet [id number] No Take 1,300 mg by mouth 2 (two) times daily as needed for pain. [provider] Past Week Active Self, Care Giver  amiodarone  (PACERONE ) 200 MG tablet [id number]  Take 1 tablet (200 mg total) by mouth daily. Caleen Qualia, MD  Active   ascorbic acid  (VITAMIN C ) 500 MG tablet [id number]  Take 1 tablet (500 mg total) by mouth daily. Amin, Sumayya, MD  Active   Camphor-Menthol -Methyl Sal [facility] EX) [id number] No Apply 1 patch topically daily as needed (pain). [provider]  Past Week Active Self, Care Giver           Med Note MAYER ARLYSS GORMAN Sonjia Oct 31, 2023  9:50 AM)    cyanocobalamin  (VITAMIN B12) 1000 MCG/ML injection [id number] No Inject 1 mL (1,000 mcg total) into the muscle every 30 (thirty) days. Cleatus Arlyss GORMAN, MD Past Month Active Self, Care Giver  diclofenac  Sodium (VOLTAREN ) 1 % GEL [id number] No Apply 1 Application topically every 6 (six) hours as needed (pain). [provider] Past Week Active Self, Care Giver           Med Note MAYER ARLYSS GORMAN Sonjia Oct 31, 2023  9:50 AM)    ELIQUIS  5 MG TABS tablet [id number] No TAKE 1 TABLET BY MOUTH TWICE A DAY Cleatus Arlyss GORMAN, MD 12/20/2023 Morning Active Self, Care Giver  famotidine  (PEPCID ) 20 MG tablet [id number] No Take 1 tablet (20 mg total) by mouth 2 (two) times daily. Stacia Glendia BRAVO, MD 12/20/2023 Morning Active Self, Care Giver  furosemide  (LASIX ) 20 MG tablet [id number] No Take 1 tablet (20 mg total) by mouth daily as needed for fluid.  Patient taking differently: Take 20 mg by mouth daily.   Cleatus Arlyss GORMAN, MD Taking Active Self, Care Giver           Med Note GROVER BURNARD GORMAN Ste[place] Dec 20, 2023  1:15 PM) PRN  gabapentin  (NEURONTIN ) 300 MG capsule [id number] No TAKE ONE CAPSULE BY MOUTH ONCE DAILY Cleatus Arlyss GORMAN, MD 12/20/2023 Active Self, Care Giver  hydrocortisone  (PROCTOSOL HC) 2.5 % rectal cream [id number]  Place 1 Application rectally 2 (two) times daily as needed for hemorrhoids or anal itching. Home med. Awanda City, MD  Active  iron  polysaccharides (NIFEREX) 150 MG capsule [id number]  Take 1 capsule (150 mg total) by mouth daily. Caleen Qualia, MD  Active   mometasone  (ELOCON ) 0.1 % cream [id number] No Spot treat thicker areas on lower legs once to twice daily until improved. Avoid face, groin, axilla. Jackquline Sawyer, MD Taking Active Self, Care Giver           Med Note GROVER, BURNARD GORMAN [place] Dec 20, 2023  1:16 PM) PRN  ondansetron  (ZOFRAN ) 4 MG tablet [id number]  TAKE ONE TABLET  (4 MG TOTAL) BY MOUTH EVERY EIGHT (EIGHT) HOURS AS NEEDED FOR NAUSEA OR VOMITING. Cleatus Arlyss GORMAN, MD  Active Self, Care Giver           Med Note GROVER, BURNARD GORMAN [place] Dec 20, 2023  1:16 PM) PRN  OVER THE COUNTER MEDICATION [id number] No Place 1-2 sprays into the nose daily as needed (allergies, nasal congestion). Unknown nasal spray. [provider] Taking Active Self, Care Giver           Med Note MAYER ARLYSS GORMAN [place] Apr 20, 2023 10:43 AM)    polyethylene glycol (MIRALAX  / GLYCOLAX ) 17 g packet [id number] No Take 17 g by mouth daily as needed for mild constipation. Verdene Purchase, MD Taking Active Self, Care Giver           Med Note GROVER BURNARD GORMAN [place] Dec 20, 2023  1:16 PM) PRN  tamsulosin  (FLOMAX ) 0.4 MG CAPS capsule [id number] No TAKE TWO CAPSULE BY MOUTH DAILY Cleatus Arlyss GORMAN, MD 12/20/2023 Active Self, Care Giver  Vitamin D , Ergocalciferol , (DRISDOL ) 1.25 MG (50000 UNIT) CAPS capsule [id number]  Take 1 capsule (50,000 Units total) by mouth every 7 (seven) days. Caleen Qualia, MD  Active             Home Care and Equipment/Supplies: Were Home Health Services Ordered?: Yes Name of Home Health Agency:: [facility] Has Agency set up a time to come to your home?: Yes First Home Health Visit Date: 01/10/24 Any new equipment or medical supplies ordered?: No  Functional Questionnaire: Do you need assistance with bathing/showering or dressing?: No Do you need assistance with meal preparation?: Yes Do you need assistance with eating?: No Do you have difficulty maintaining continence: No Do you need assistance with getting out of bed/getting out of a chair/moving?: No Do you have difficulty managing or taking your medications?: No  Follow up appointments reviewed: PCP Follow-up appointment confirmed?: Yes Date of PCP follow-up appointment?: 01/12/24 Follow-up Provider: Arlyss Cleatus Specialist [facility] Follow-up appointment confirmed?: Yes Date of Specialist  follow-up appointment?: 01/18/24 Follow-Up Specialty Provider:: Dr. Anner, Cardiology Do you need transportation to your follow-up appointment?: No Do you understand care options if your condition(s) worsen?: Yes-patient verbalized understanding  SDOH Interventions Today    Flowsheet Row Most Recent Value  SDOH Interventions   Food Insecurity Interventions Intervention Not Indicated  Housing Interventions Intervention Not Indicated  Transportation Interventions Intervention Not Indicated  Utilities Interventions Intervention Not Indicated  Social Connections Interventions Intervention Not Indicated      Interventions Today    Flowsheet Row Most Recent Value  Chronic Disease   Chronic disease during today's visit Congestive Heart Failure (CHF)  General Interventions   General Interventions Discussed/Reviewed General Interventions Discussed  Exercise Interventions   Exercise Discussed/Reviewed Physical Activity  Physical Activity Discussed/Reviewed Physical Activity Discussed  Education Interventions   Education Provided Provided Education  Provided Verbal Education On When to see the  doctor, Medication, Exercise  Pharmacy Interventions   Pharmacy Dicussed/Reviewed Medications and their functions  Safety Interventions   Safety Discussed/Reviewed Fall Risk       TOC Outreach completed today. The patient was in the hospital for 10 days related to A-fib and CHF. The patient and his spouse moved to an ILF called [facility]. He is to receive HHPT and OT at the facility. Reviewed medications and upcoming provider appointments. He has plans to spend his 83th birthday in [place] pending his strength and if his providers are in agreement. RNCM to call after 01/18/24 for update.   The patient has been provided with contact information for the care management team and has been advised to call with any health-related questions or concerns. The patient verbalized understanding with  current POC. The patient is directed to their insurance card regarding availability of benefits coverage.  [name] Balboa, BSN, RN   VBCI - [employer] [facility] RN Care Manager [phone]

## 2024-01-10 DIAGNOSIS — Z96693 Finger-joint replacement, bilateral: Secondary | ICD-10-CM | POA: Diagnosis not present

## 2024-01-10 DIAGNOSIS — I2510 Atherosclerotic heart disease of native coronary artery without angina pectoris: Secondary | ICD-10-CM | POA: Diagnosis not present

## 2024-01-10 DIAGNOSIS — J9601 Acute respiratory failure with hypoxia: Secondary | ICD-10-CM | POA: Diagnosis not present

## 2024-01-10 DIAGNOSIS — I700 Atherosclerosis of aorta: Secondary | ICD-10-CM | POA: Diagnosis not present

## 2024-01-10 DIAGNOSIS — Z85828 Personal history of other malignant neoplasm of skin: Secondary | ICD-10-CM | POA: Diagnosis not present

## 2024-01-10 DIAGNOSIS — Z7901 Long term (current) use of anticoagulants: Secondary | ICD-10-CM | POA: Diagnosis not present

## 2024-01-10 DIAGNOSIS — N39498 Other specified urinary incontinence: Secondary | ICD-10-CM | POA: Diagnosis not present

## 2024-01-10 DIAGNOSIS — I5033 Acute on chronic diastolic (congestive) heart failure: Secondary | ICD-10-CM | POA: Diagnosis not present

## 2024-01-10 DIAGNOSIS — M170 Bilateral primary osteoarthritis of knee: Secondary | ICD-10-CM | POA: Diagnosis not present

## 2024-01-10 DIAGNOSIS — Z87440 Personal history of urinary (tract) infections: Secondary | ICD-10-CM | POA: Diagnosis not present

## 2024-01-10 DIAGNOSIS — Z6833 Body mass index (BMI) 33.0-33.9, adult: Secondary | ICD-10-CM | POA: Diagnosis not present

## 2024-01-10 DIAGNOSIS — M1990 Unspecified osteoarthritis, unspecified site: Secondary | ICD-10-CM | POA: Diagnosis not present

## 2024-01-10 DIAGNOSIS — I110 Hypertensive heart disease with heart failure: Secondary | ICD-10-CM | POA: Diagnosis not present

## 2024-01-10 DIAGNOSIS — N401 Enlarged prostate with lower urinary tract symptoms: Secondary | ICD-10-CM | POA: Diagnosis not present

## 2024-01-10 DIAGNOSIS — E785 Hyperlipidemia, unspecified: Secondary | ICD-10-CM | POA: Diagnosis not present

## 2024-01-10 DIAGNOSIS — Z96642 Presence of left artificial hip joint: Secondary | ICD-10-CM | POA: Diagnosis not present

## 2024-01-10 DIAGNOSIS — E611 Iron deficiency: Secondary | ICD-10-CM | POA: Diagnosis not present

## 2024-01-10 DIAGNOSIS — J9622 Acute and chronic respiratory failure with hypercapnia: Secondary | ICD-10-CM | POA: Diagnosis not present

## 2024-01-10 DIAGNOSIS — I480 Paroxysmal atrial fibrillation: Secondary | ICD-10-CM | POA: Diagnosis not present

## 2024-01-10 DIAGNOSIS — J9621 Acute and chronic respiratory failure with hypoxia: Secondary | ICD-10-CM | POA: Diagnosis not present

## 2024-01-11 DIAGNOSIS — E611 Iron deficiency: Secondary | ICD-10-CM | POA: Diagnosis not present

## 2024-01-11 DIAGNOSIS — N401 Enlarged prostate with lower urinary tract symptoms: Secondary | ICD-10-CM | POA: Diagnosis not present

## 2024-01-11 DIAGNOSIS — Z7901 Long term (current) use of anticoagulants: Secondary | ICD-10-CM | POA: Diagnosis not present

## 2024-01-11 DIAGNOSIS — Z6833 Body mass index (BMI) 33.0-33.9, adult: Secondary | ICD-10-CM | POA: Diagnosis not present

## 2024-01-11 DIAGNOSIS — N39498 Other specified urinary incontinence: Secondary | ICD-10-CM | POA: Diagnosis not present

## 2024-01-11 DIAGNOSIS — I480 Paroxysmal atrial fibrillation: Secondary | ICD-10-CM | POA: Diagnosis not present

## 2024-01-11 DIAGNOSIS — J9621 Acute and chronic respiratory failure with hypoxia: Secondary | ICD-10-CM | POA: Diagnosis not present

## 2024-01-11 DIAGNOSIS — E785 Hyperlipidemia, unspecified: Secondary | ICD-10-CM | POA: Diagnosis not present

## 2024-01-11 DIAGNOSIS — I2510 Atherosclerotic heart disease of native coronary artery without angina pectoris: Secondary | ICD-10-CM | POA: Diagnosis not present

## 2024-01-11 DIAGNOSIS — I110 Hypertensive heart disease with heart failure: Secondary | ICD-10-CM | POA: Diagnosis not present

## 2024-01-11 DIAGNOSIS — I5033 Acute on chronic diastolic (congestive) heart failure: Secondary | ICD-10-CM | POA: Diagnosis not present

## 2024-01-11 DIAGNOSIS — J9622 Acute and chronic respiratory failure with hypercapnia: Secondary | ICD-10-CM | POA: Diagnosis not present

## 2024-01-11 DIAGNOSIS — M1990 Unspecified osteoarthritis, unspecified site: Secondary | ICD-10-CM | POA: Diagnosis not present

## 2024-01-11 DIAGNOSIS — Z96642 Presence of left artificial hip joint: Secondary | ICD-10-CM | POA: Diagnosis not present

## 2024-01-11 DIAGNOSIS — I700 Atherosclerosis of aorta: Secondary | ICD-10-CM | POA: Diagnosis not present

## 2024-01-12 ENCOUNTER — Encounter: Payer: Self-pay | Admitting: Family Medicine

## 2024-01-12 ENCOUNTER — Ambulatory Visit: Payer: Medicare Other | Admitting: Family Medicine

## 2024-01-12 VITALS — BP 124/78 | HR 62 | Temp 98.7°F | Ht 71.0 in | Wt 232.0 lb

## 2024-01-12 DIAGNOSIS — I509 Heart failure, unspecified: Secondary | ICD-10-CM

## 2024-01-12 DIAGNOSIS — E559 Vitamin D deficiency, unspecified: Secondary | ICD-10-CM | POA: Diagnosis not present

## 2024-01-12 DIAGNOSIS — I4891 Unspecified atrial fibrillation: Secondary | ICD-10-CM

## 2024-01-12 DIAGNOSIS — E538 Deficiency of other specified B group vitamins: Secondary | ICD-10-CM | POA: Diagnosis not present

## 2024-01-12 LAB — COMPREHENSIVE METABOLIC PANEL
ALT: 14 U/L (ref 0–53)
AST: 12 U/L (ref 0–37)
Albumin: 4 g/dL (ref 3.5–5.2)
Alkaline Phosphatase: 50 U/L (ref 39–117)
BUN: 24 mg/dL — ABNORMAL HIGH (ref 6–23)
CO2: 29 meq/L (ref 19–32)
Calcium: 8.6 mg/dL (ref 8.4–10.5)
Chloride: 105 meq/L (ref 96–112)
Creatinine, Ser: 1.08 mg/dL (ref 0.40–1.50)
GFR: 62.81 mL/min (ref >60.00)
Glucose, Bld: 68 mg/dL — ABNORMAL LOW (ref 70–99)
Potassium: 4.3 meq/L (ref 3.5–5.1)
Sodium: 146 meq/L — ABNORMAL HIGH (ref 135–145)
Total Bilirubin: 0.9 mg/dL (ref 0.2–1.2)
Total Protein: 6.5 g/dL (ref 6.0–8.3)

## 2024-01-12 LAB — CBC WITH DIFFERENTIAL/PLATELET
Basophils Absolute: 0 10*3/uL (ref 0.0–0.1)
Basophils Relative: 0.1 % (ref 0.0–3.0)
Eosinophils Absolute: 0 10*3/uL (ref 0.0–0.7)
Eosinophils Relative: 0.1 % (ref 0.0–5.0)
HCT: 43.7 % (ref 39.0–52.0)
Hemoglobin: 14.3 g/dL (ref 13.0–17.0)
Lymphocytes Relative: 13 % (ref 12.0–46.0)
Lymphs Abs: 1.1 10*3/uL (ref 0.7–4.0)
MCHC: 32.8 g/dL (ref 30.0–36.0)
MCV: 97.1 fL (ref 78.0–100.0)
Monocytes Absolute: 0.8 10*3/uL (ref 0.1–1.0)
Monocytes Relative: 9.6 % (ref 3.0–12.0)
Neutro Abs: 6.6 10*3/uL (ref 1.4–7.7)
Neutrophils Relative %: 77.2 % — ABNORMAL HIGH (ref 43.0–77.0)
Platelets: 215 10*3/uL (ref 150.0–400.0)
RBC: 4.5 Mil/uL (ref 4.22–5.81)
RDW: 14.3 % (ref 11.5–15.5)
WBC: 8.5 10*3/uL (ref 4.0–10.5)

## 2024-01-12 LAB — TSH: TSH: 3.54 u[IU]/mL (ref 0.35–5.50)

## 2024-01-12 MED ORDER — CYANOCOBALAMIN 1000 MCG/ML IJ SOLN
1000.0000 ug | Freq: Once | INTRAMUSCULAR | Status: AC
  Administered 2024-01-12: 1000 ug via INTRAMUSCULAR

## 2024-01-12 NOTE — Progress Notes (Signed)
 Per orders of Dr. Crawford Givens, injection of B-12 given by Leonor Liv in left deltoid. Patient tolerated injection well. Patient will make appointment for 1 month.

## 2024-01-12 NOTE — Progress Notes (Signed)
 Patient needs sleep study and pulmonary follow-up as an outpatient for PFTs, d/w pt rationale for both.  He has pulmonary f/u pending.   # Iron -deficiency, transfer sat 8%, started oral iron  supplement. # Vitamin D  level 31.7, at lower end, started vitamin D  50k units p.o. weekly.  ====================== Inpatient course d/w pt.    # Acute respiratory failure with hypoxia and hypercapnia, Resolved  Decompensated resp failure requiring BIPAP in setting of acute on chronic HFpEF. No noted prior hx/o tobacco abuse or obstructive lung disease  Suspect element of OHS/sleep apnea-noted large body habitus  --pt was weaned down to room air prior to discharge. Patient needs sleep study and pulmonary follow-up as an outpatient for PFTs   # Acute on chronic heart failure with preserved ejection fraction (HFpEF) 2D ECHO 04/2023 w/ EF 60-65%  hypercarbic resp failure on presentation w/ volume overload  Noted baseline interimittent use of lasix .  BNP 625- well above baseline, CXR w/ cardiomegaly and vascular congestion. Cardiology consulted S/p IV lasix   --discharged back on home oral lasix  20 mg daily PRN --home Lopressor  d/c'ed by cardio due to bradycardia   # Atrial fibrillation with rapid ventricular response Initial HR into 150s, Improved to 50s s/p IV metoprolol  x 1.  S/p digoxin  0.5 mg p.o. daily and S/p Cardizem  IV infusion. Cardiology consulted, sp TEE and cardioversion 1/17 Started oral amiodarone  load 1/22: Remained borderline bradycardic, discontinuing Lopressor  --cont Eliquis  --cont amiodarone  200 mg daily   # Hypokalemia,  --monitored and supplemented PRN   # Coronary artery disease, non-occlusive No active chest pain    # HTN (hypertension) --BP wnl without antihypertensives --home Lopressor  d/c'ed by cardio due to bradycardia   # Iron -deficiency,  started oral iron  supplement. # Vitamin D   level 31.7, at lower end, started vitamin D  50k units p.o. weekly. # Vitamin B12   level 378, goal <400, started B12 oral supplement. Using walker at baseline.  No CP.  Not SOB.    ======================  Diuresed and breathing improved.  Compliant with current meds.   Taking lasix  if needed.  Still anticoagulated.  On amiodarone .  Rationale for current meds d/w pt.   H/o B12 def.  B12 dose done at OV.    F/u vit D ordered.   He has pulomary pending for 01/17/24 He has cardiology pending for 01/18/24  Meds, vitals, and allergies reviewed.   ROS: Per HPI unless specifically indicated in ROS section   GEN: nad, alert and oriented HEENT: mucous membranes moist NECK: supple w/o LA CV: rrr PULM: ctab, no inc wob ABD: soft, +bs EXT: trace BLE edema SKIN: no acute rash  31 minutes were devoted to patient care in this encounter (this includes time spent reviewing the patient's file/history, interviewing and examining the patient, counseling/reviewing plan with patient).

## 2024-01-12 NOTE — Patient Instructions (Addendum)
 Don't change your meds for now.  Pulomary pending for 01/17/24 Cardiology pending for 01/18/24 Take care.  Glad to see you.  Go to the lab on the way out.   If you have mychart we'll likely use that to update you.

## 2024-01-14 ENCOUNTER — Encounter: Payer: Self-pay | Admitting: Family Medicine

## 2024-01-14 NOTE — Assessment & Plan Note (Signed)
 H/o, continue amiodarone  with prn lasix .  Continue eliquis  as is.   See notes on labs.  Ctab, okay for outpatient f/u.  Pulomary pending for 01/17/24 Cardiology pending for 01/18/24

## 2024-01-14 NOTE — Assessment & Plan Note (Signed)
 H/o B12 def.  B12 dose done at OV.

## 2024-01-14 NOTE — Assessment & Plan Note (Signed)
 H/o, sounds RRR now.  H/o, continue amiodarone  with prn lasix .  Continue eliquis  as is.   See notes on labs.

## 2024-01-14 NOTE — Assessment & Plan Note (Signed)
 H/o, continue replacement and we can recheck vit D later.

## 2024-01-15 ENCOUNTER — Telehealth: Payer: Self-pay

## 2024-01-15 DIAGNOSIS — I480 Paroxysmal atrial fibrillation: Secondary | ICD-10-CM | POA: Diagnosis not present

## 2024-01-15 DIAGNOSIS — J9621 Acute and chronic respiratory failure with hypoxia: Secondary | ICD-10-CM | POA: Diagnosis not present

## 2024-01-15 DIAGNOSIS — H2513 Age-related nuclear cataract, bilateral: Secondary | ICD-10-CM | POA: Diagnosis not present

## 2024-01-15 DIAGNOSIS — J9622 Acute and chronic respiratory failure with hypercapnia: Secondary | ICD-10-CM | POA: Diagnosis not present

## 2024-01-15 DIAGNOSIS — Z96642 Presence of left artificial hip joint: Secondary | ICD-10-CM | POA: Diagnosis not present

## 2024-01-15 DIAGNOSIS — H47239 Glaucomatous optic atrophy, unspecified eye: Secondary | ICD-10-CM | POA: Diagnosis not present

## 2024-01-15 DIAGNOSIS — E785 Hyperlipidemia, unspecified: Secondary | ICD-10-CM | POA: Diagnosis not present

## 2024-01-15 DIAGNOSIS — H34832 Tributary (branch) retinal vein occlusion, left eye, with macular edema: Secondary | ICD-10-CM | POA: Diagnosis not present

## 2024-01-15 DIAGNOSIS — Z0100 Encounter for examination of eyes and vision without abnormal findings: Secondary | ICD-10-CM | POA: Diagnosis not present

## 2024-01-15 DIAGNOSIS — N39498 Other specified urinary incontinence: Secondary | ICD-10-CM | POA: Diagnosis not present

## 2024-01-15 DIAGNOSIS — I5033 Acute on chronic diastolic (congestive) heart failure: Secondary | ICD-10-CM | POA: Diagnosis not present

## 2024-01-15 DIAGNOSIS — N401 Enlarged prostate with lower urinary tract symptoms: Secondary | ICD-10-CM | POA: Diagnosis not present

## 2024-01-15 DIAGNOSIS — H04123 Dry eye syndrome of bilateral lacrimal glands: Secondary | ICD-10-CM | POA: Diagnosis not present

## 2024-01-15 DIAGNOSIS — M1990 Unspecified osteoarthritis, unspecified site: Secondary | ICD-10-CM | POA: Diagnosis not present

## 2024-01-15 DIAGNOSIS — I110 Hypertensive heart disease with heart failure: Secondary | ICD-10-CM | POA: Diagnosis not present

## 2024-01-15 DIAGNOSIS — Z7901 Long term (current) use of anticoagulants: Secondary | ICD-10-CM | POA: Diagnosis not present

## 2024-01-15 DIAGNOSIS — I700 Atherosclerosis of aorta: Secondary | ICD-10-CM | POA: Diagnosis not present

## 2024-01-15 DIAGNOSIS — Z6833 Body mass index (BMI) 33.0-33.9, adult: Secondary | ICD-10-CM | POA: Diagnosis not present

## 2024-01-15 DIAGNOSIS — I2510 Atherosclerotic heart disease of native coronary artery without angina pectoris: Secondary | ICD-10-CM | POA: Diagnosis not present

## 2024-01-15 DIAGNOSIS — H25041 Posterior subcapsular polar age-related cataract, right eye: Secondary | ICD-10-CM | POA: Diagnosis not present

## 2024-01-15 DIAGNOSIS — E611 Iron deficiency: Secondary | ICD-10-CM | POA: Diagnosis not present

## 2024-01-15 NOTE — Telephone Encounter (Signed)
 Please give the order.  Thanks.

## 2024-01-15 NOTE — Telephone Encounter (Signed)
 Copied from CRM [id number]. Topic: Clinical - Home Health Verbal Orders >> Jan 15, 2024  2:23 PM Crist Dominion wrote: Caller/Agency: Freeda Jerry / [facility] Callback Number: Confidential line: [phone]  Service Requested: Occupational Therapy Frequency: 1 Week 7 Any new concerns about the patient? No

## 2024-01-16 NOTE — Telephone Encounter (Signed)
Spoke with Sharine orders given

## 2024-01-18 ENCOUNTER — Encounter: Payer: Self-pay | Admitting: Cardiology

## 2024-01-18 ENCOUNTER — Telehealth: Payer: Self-pay | Admitting: Family Medicine

## 2024-01-18 ENCOUNTER — Ambulatory Visit: Payer: Medicare Other | Attending: Cardiology | Admitting: Cardiology

## 2024-01-18 ENCOUNTER — Telehealth: Payer: Self-pay | Admitting: Cardiology

## 2024-01-18 VITALS — BP 115/66 | HR 56 | Ht 70.0 in | Wt 229.4 lb

## 2024-01-18 DIAGNOSIS — I5032 Chronic diastolic (congestive) heart failure: Secondary | ICD-10-CM | POA: Diagnosis not present

## 2024-01-18 DIAGNOSIS — I2510 Atherosclerotic heart disease of native coronary artery without angina pectoris: Secondary | ICD-10-CM | POA: Diagnosis not present

## 2024-01-18 DIAGNOSIS — E785 Hyperlipidemia, unspecified: Secondary | ICD-10-CM

## 2024-01-18 DIAGNOSIS — R600 Localized edema: Secondary | ICD-10-CM | POA: Diagnosis not present

## 2024-01-18 DIAGNOSIS — I480 Paroxysmal atrial fibrillation: Secondary | ICD-10-CM

## 2024-01-18 MED ORDER — AMIODARONE HCL 200 MG PO TABS: 200.0000 mg | ORAL_TABLET | Freq: Every day | ORAL | 0 refills | Status: DC

## 2024-01-18 MED ORDER — AMIODARONE HCL 100 MG PO TABS: 100.0000 mg | ORAL_TABLET | Freq: Every day | ORAL | 0 refills | Status: DC

## 2024-01-18 MED ORDER — AMIODARONE HCL 400 MG PO TABS: ORAL_TABLET | ORAL | 0 refills | Status: DC

## 2024-01-18 MED ORDER — METOPROLOL TARTRATE 25 MG PO TABS
25.0000 mg | ORAL_TABLET | Freq: Two times a day (BID) | ORAL | 3 refills | Status: DC
Start: 2024-03-05 — End: 2024-03-12

## 2024-01-18 NOTE — Assessment & Plan Note (Signed)
Notable improvement.  Continue with PRN Lasix.

## 2024-01-18 NOTE — Patient Instructions (Signed)
Medication Instructions:   Please continue taking Amiodarone 200 MG until 02/03/24.  02/03/24 Decrease Amiodarone to 100 MG daily for one month.  Starting 03/05/24 stop Amiodarone and start Metoprolol Tartrate 25 Mg twice daily.  Please get a KardiaMobile and if shows that you are in Atrial Fibrillation please notify the office.  Stop Metoprolol and start Amiodarone as follows.  Take Amiodarone 400 MG twice daily for 3 days. Then take Amiodarone 400 MG daily for 3 days. Then take Amiodarone 200 MG daily for one week.    *If you need a refill on your cardiac medications before your next appointment, please call your pharmacy*   Lab Work: None ordered. If you have labs (blood work) drawn today and your tests are completely normal, you will receive your results only by: MyChart Message (if you have MyChart) OR A paper copy in the mail If you have any lab test that is abnormal or we need to change your treatment, we will call you to review the results.   Testing/Procedures: None ordered.    Follow-Up: At [facility], you and your health needs are our priority.  As part of our continuing mission to provide you with exceptional heart care, we have created designated Provider Care Teams.  These Care Teams include your primary Cardiologist (physician) and Advanced Practice Providers (APPs -  Physician Assistants and Nurse Practitioners) who all work together to provide you with the care you need, when you need it.  We recommend signing up for the patient portal called "MyChart".  Sign up information is provided on this After Visit Summary.  MyChart is used to connect with patients for Virtual Visits (Telemedicine).  Patients are able to view lab/test results, encounter notes, upcoming appointments, etc.  Non-urgent messages can be sent to your provider as well.   To learn more about what you can do with MyChart, go to [email or web address].    Your next appointment:   3 -  11month(s)  Provider:   Bryan Lemma, MD or Nicolasa Ducking, NP

## 2024-01-18 NOTE — Assessment & Plan Note (Signed)
Labs on last May showed LDL of 58 and HDL 41.  Well-controlled on no meds.  Continue to monitor.

## 2024-01-18 NOTE — Assessment & Plan Note (Signed)
Coronary CTA showed mild LAD and RCA stenosis (25-50%) and less than 25% stenosis in the circumflex. -No change in plans.  Eventually converted from amiodarone to metoprolol. -Lipids as of May 2024 well-controlled on no medicines.

## 2024-01-18 NOTE — Telephone Encounter (Signed)
Pt c/o medication issue:  1. Name of Medication:   amiodarone (PACERONE) 100 MG tablet    amiodarone (PACERONE) 200 MG tablet    amiodarone (PACERONE) 200 MG tablet    2. How are you currently taking this medication (dosage and times per day)?   3. Are you having a reaction (difficulty breathing--STAT)? No  4. What is your medication issue? Pharmacist would like a c/b regarding clarification on above medications. Please advise

## 2024-01-18 NOTE — Telephone Encounter (Signed)
Noted. Thanks.

## 2024-01-18 NOTE — Assessment & Plan Note (Signed)
Recent hospitalization with AFib and respiratory failure.  Currently on maintenance dose 200 mg daily amiodarone with no recent episodes of AFib.  Mild to moderate mitral regurgitation but otherwise normal EF noted on echo. -Continue Amiodarone 200mg  daily for the month of February. -Reduce Amiodarone to 100mg  daily for the month of March. -Discontinue Amiodarone and restart Metoprolol 25mg  BID after March. -If breakthrough AFib occurs increase Amiodarone to 400mg  BID for 3 days, .  then 200mg  BID for 3 days, . then 200mg  daily for the remainder of the month. -Recommend purchase of KardiaMobile monitor to track heart rhythm. -Continue anticoagulation with Eliquis.

## 2024-01-18 NOTE — Assessment & Plan Note (Addendum)
Chronic heart failure this clearly diastolic in nature complicated by recurrent A-fib. Now she is no longer in A-fib still has some mild edema.  She has intermittent availability to Dr. Hazle Coca places that are safer for her evaluation.  But for now we will continue as needed Lasix and foot elevation  We can reassess blood pressures and upcoming follow-up visits to determine if we need any afterload reduction   No current edema noted, patient has PRN Lasix. -Continue PRN Lasix for breakthrough edema.

## 2024-01-18 NOTE — Progress Notes (Signed)
Cardiology Office Note:  .   Date:  01/18/2024  ID:  Todd Gonzales, DOB 07-01-1941, MRN [id number] PCP: Joaquim Nam, MD  [facility] Providers Cardiologist:  Bryan Lemma, MD     Chief Complaint  Patient presents with   Hospitalization Follow-up    Admitted with recurrent A-fib-cardioversion with amiodarone    Patient Profile: .     Todd Gonzales is an obese 83 y.o. male  with a PMH notable for PAF, HTN & HLD with "reported CAD who presents here for planned hospital follow-up at the request of Joaquim Nam, MD.  Hospitalized [place] May 2024 for dyspnea fatigue;  Noted to be in A-fib/flutter with RVR, also noted PVCs. Given 1 dose of IV Lasix (echo with normal LV function) => Chemically cardioverted with amiodarone; discharged on Lopressor 25 mg twice daily, and Eliquis. => Amiodarone discontinued at follow-up visit with plans to PRN amiodarone for breakthrough. Coronary CTA and event monitor ordered    Todd Gonzales was last seen on August 31, 2023 for 1 month follow-up to discuss results with Coronary CTA and monitor.  He says weights at home are range between 245 and 251 denied any PND orthopnea.  Trivial leg edema.  No sensation of A-fib/irregular heartbeats.  Intermittent short-lived oxygen saturations.  Noted exertional dyspnea, palpitations and stable angina.  No recurrent A-fib. => Continued on Lopressor 25 mg daily, apixaban 5 mg daily with as needed Lasix.  Unfortunately, he was admitted to [facility] from January 15 to January 08, 2024 with worsening dyspnea over a week or so with oxygen desaturations in the 80s.  Was placed on BiPAP by EMS who noted A-fib with RVR.  Thought to be decompensated acute on chronic DHF from A-fib.  She was treated with IV metoprolol x 1 followed by diltiazem infusion.  He was loaded with oral amiodarone and underwent TEE-DCCV restoring sinus rhythm.  He was discharged on amiodarone IV taper down to 20 mg daily was working well with  PT.  Subjective  Discussed the use of AI scribe software for clinical note transcription with the patient, who gave verbal consent to proceed.  A good portion of the history was obtained from discussing with the wife.  History of Present Illness   Todd Gonzales "Todd Gonzales" is an obese 83 year old male with atrial fibrillation who presents for follow-up after recent hospitalization.  He was hospitalized from January 15 to January 08, 2024, for atrial fibrillation and associated respiratory failure with low oxygen levels. During this hospitalization, he underwent a transesophageal echocardiogram and cardioversion. He has had two episodes of atrial fibrillation in the last two years, including the recent one.  He is currently on amiodarone and Eliquis. He was previously on metoprolol 25 mg twice a day but is not taking it now due to the amiodarone. He is also taking iron supplements. He experiences some nausea, which he attributes to the amiodarone or iron. No recent episodes of shortness of breath, chest pain, or palpitations since his discharge from the hospital.  No dizziness, passing out, or waking up short of breath at night. He mentions a couple of episodes of noticeable imbalance since returning home but no significant dizziness. He sleeps on one pillow and does not experience shortness of breath when lying flat.  He was denied inpatient rehabilitation twice after his recent hospitalization. He has not been using Lasix since the hospital stay, as he only received it once during his admission. His  ankles are not swollen, although he has some sores from a previous attack in May.       Cardiovascular ROS: positive for - dyspnea on exertion, orthopnea, palpitations, and notably improved edema.  All better since restoring sinus rhythm. negative for - chest pain, edema, irregular heartbeat, murmur, orthopnea, palpitations, paroxysmal nocturnal dyspnea, rapid heart rate, shortness of breath, or  lightheadedness dizziness or wooziness, syncope or near syncope, TIA or amaurosis fugax, claudication  ROS:  Review of Systems -    Objective    Studies Reviewed: Marland Kitchen   EKG Interpretation Date/Time:  Thursday January 18 2024 10:26:17 EST Ventricular Rate:  56 PR Interval:  186 QRS Duration:  102 QT Interval:  406 QTC Calculation: 391 R Axis:   17  Text Interpretation: Sinus bradycardia Incomplete right bundle branch block When compared with ECG of 22-Dec-2023 13:01, PREVIOUS ECG IS PRESENT Confirmed by Bryan Lemma ([id number]) on 01/18/2024 10:48:50 AM    ECHO 12/20/2023:  1. Left ventricular ejection fraction, by estimation, is 60 to 65% . The left ventricle has normal function. The left ventricle has no regional wall motion abnormalities. There is mild left ventricular hypertrophy. Left ventricular diastolic parameters are indeterminate. 2. Right ventricular systolic function is normal. The right ventricular size is normal. There is normal pulmonary artery systolic pressure. The estimated right ventricular systolic pressure is 29. 9 mmHg. 3. The mitral valve is normal in structure. No evidence of mitral valve regurgitation. No evidence of mitral stenosis. Moderate mitral annular calcification. 4. The aortic valve is normal in structure. Aortic valve regurgitation is not visualized. No aortic stenosis is present. 5. The inferior vena cava is normal in size with greater than 50% respiratory variability, suggesting right atrial pressure of 3 mmHg. TEE / DCCV 12/22/2023:  1. Left ventricular ejection fraction, by estimation, is 60 to 65% . The left ventricle has normal function. 2. Right ventricular systolic function is normal. The right ventricular size is normal. 3. Left atrial size was mildly dilated. No left atrial/ left atrial appendage thrombus was detected. 4. The mitral valve is normal in structure. Mild to moderate mitral valve regurgitation. 5. The aortic valve is tricuspid. Aortic valve  regurgitation is mild.  Coronary CTA 08/28/2023: Coronary Calcium Score 4 9.  Mild LAD and RCA stenosis (25 to 49%).  Minimal LCx (less than 25%).  CAD-RADS 2.  Lipid panel 04/16/2023: TC 109, TG 51, HDL 41, LDL 58  Risk Assessment/Calculations:    CHA2DS2-VASc Score = 5   This indicates a 7.2% annual risk of stroke. The patient's score is based upon: CHF History: 1 HTN History: 1 Diabetes History: 0 Stroke History: 0 Vascular Disease History: 1 Age Score: 2 Gender Score: 0   Remains on Eliquis          Physical Exam:   VS:  BP 115/66 (BP Location: Left Arm, Patient Position: Sitting, Cuff Size: Normal)   Pulse (!) 56   Ht 5\' 10"  (1.778 m)   Wt 229 lb 6.4 oz (104.1 kg)   SpO2 97%   BMI 32.92 kg/m    Wt Readings from Last 3 Encounters:  01/18/24 229 lb 6.4 oz (104.1 kg)  01/12/24 232 lb (105.2 kg)  01/04/24 228 lb 13.4 oz (103.8 kg)    GEN: Well nourished, well developed in no acute distress; mild truncal obesity.  Mildly chronically ill-appearing.  Seems somewhat tired. NECK: No JVD; No carotid bruits CARDIAC: Normal S1, S2; RRR, no murmurs, rubs, gallops RESPIRATORY:  Clear to auscultation  without rales, wheezing or rhonchi ; nonlabored, good air movement. ABDOMEN: Soft, non-tender, non-distended EXTREMITIES: Trace to 1+ bilateral ankle edema; No deformity      ASSESSMENT AND PLAN: .    Problem List Items Addressed This Visit       Cardiology Problems   Chronic diastolic (congestive) heart failure (HCC) (Chronic)   Chronic heart failure this clearly diastolic in nature complicated by recurrent A-fib. Now she is no longer in A-fib still has some mild edema.  She has intermittent availability to Dr. Hazle Coca places that are safer for her evaluation.  But for now we will continue as needed Lasix and foot elevation  We can reassess blood pressures and upcoming follow-up visits to determine if we need any afterload reduction   No current edema noted, patient has PRN  Lasix. -Continue PRN Lasix for breakthrough edema.      Relevant Medications   amiodarone (PACERONE) 100 MG tablet (Start on 02/03/2024)   metoprolol tartrate (LOPRESSOR) 25 MG tablet (Start on 03/05/2024)   amiodarone (PACERONE) 400 MG tablet   amiodarone (PACERONE) 200 MG tablet   Coronary artery disease, non-occlusive (Chronic)   Coronary CTA showed mild LAD and RCA stenosis (25-50%) and less than 25% stenosis in the circumflex. -No change in plans.  Eventually converted from amiodarone to metoprolol. -Lipids as of May 2024 well-controlled on no medicines.      Relevant Medications   amiodarone (PACERONE) 100 MG tablet (Start on 02/03/2024)   metoprolol tartrate (LOPRESSOR) 25 MG tablet (Start on 03/05/2024)   amiodarone (PACERONE) 400 MG tablet   amiodarone (PACERONE) 200 MG tablet   Other Relevant Orders   EKG 12-Lead (Completed)   HLD (hyperlipidemia) (Chronic)   Labs on last May showed LDL of 58 and HDL 41.  Well-controlled on no meds.  Continue to monitor.      Relevant Medications   amiodarone (PACERONE) 100 MG tablet (Start on 02/03/2024)   metoprolol tartrate (LOPRESSOR) 25 MG tablet (Start on 03/05/2024)   amiodarone (PACERONE) 400 MG tablet   amiodarone (PACERONE) 200 MG tablet   Paroxysmal atrial fibrillation/flutter (HCC) - Primary (Chronic)   Recent hospitalization with AFib and respiratory failure.  Currently on maintenance dose 200 mg daily amiodarone with no recent episodes of AFib.  Mild to moderate mitral regurgitation but otherwise normal EF noted on echo. -Continue Amiodarone 200mg  daily for the month of February. -Reduce Amiodarone to 100mg  daily for the month of March. -Discontinue Amiodarone and restart Metoprolol 25mg  BID after March. -If breakthrough AFib occurs increase Amiodarone to 400mg  BID for 3 days, .  then 200mg  BID for 3 days, . then 200mg  daily for the remainder of the month. -Recommend purchase of KardiaMobile monitor to track heart  rhythm. -Continue anticoagulation with Eliquis.      Relevant Medications   amiodarone (PACERONE) 100 MG tablet (Start on 02/03/2024)   metoprolol tartrate (LOPRESSOR) 25 MG tablet (Start on 03/05/2024)   amiodarone (PACERONE) 400 MG tablet   amiodarone (PACERONE) 200 MG tablet   Other Relevant Orders   EKG 12-Lead (Completed)     Other   Bilateral lower extremity edema (Chronic)   Notable improvement.  Continue with PRN Lasix.         General Health Maintenance -Continue Eliquis for anticoagulation. -Continue iron supplementation. -Encourage continuation of rehab and weight loss efforts.         Follow-Up: No follow-ups on file.  Total time spent: 27 min spent with patient + 16 min spent charting =  43 min I spent 47 minutes in the care of DEMONI GERGEN today including reviewing labs (2 min), reviewing studies (5 minutes), face to face time discussing treatment options (discussing his recent hospitalization and plans for breakthrough spells management.  27), 12, and documenting in the encounter.    Signed, Marykay Lex, MD, MS Bryan Lemma, M.D., M.S. Interventional Cardiologist  [facility]  Pager # [phone] Phone # [phone] [address] [place], [place] [address]

## 2024-01-18 NOTE — Telephone Encounter (Signed)
Copied from CRM [id number]. Topic: General - Other >> Jan 18, 2024 12:02 PM Todd Gonzales wrote: Reason for CRM: [facility] called into advise Dr. Para March patient missed appointment due to another appointment and will be going out of town and return on 2/23

## 2024-01-18 NOTE — Telephone Encounter (Signed)
Called and spoke with Thayer Ohm from [facility]. Clarification of Amiodarone prescription given to pharmacy. Verbalizes understanding.

## 2024-01-19 ENCOUNTER — Telehealth: Payer: Self-pay

## 2024-01-19 ENCOUNTER — Other Ambulatory Visit: Payer: Self-pay

## 2024-01-19 DIAGNOSIS — E611 Iron deficiency: Secondary | ICD-10-CM | POA: Diagnosis not present

## 2024-01-19 DIAGNOSIS — I2510 Atherosclerotic heart disease of native coronary artery without angina pectoris: Secondary | ICD-10-CM | POA: Diagnosis not present

## 2024-01-19 DIAGNOSIS — I110 Hypertensive heart disease with heart failure: Secondary | ICD-10-CM | POA: Diagnosis not present

## 2024-01-19 DIAGNOSIS — I5033 Acute on chronic diastolic (congestive) heart failure: Secondary | ICD-10-CM | POA: Diagnosis not present

## 2024-01-19 DIAGNOSIS — N39498 Other specified urinary incontinence: Secondary | ICD-10-CM | POA: Diagnosis not present

## 2024-01-19 DIAGNOSIS — I480 Paroxysmal atrial fibrillation: Secondary | ICD-10-CM | POA: Diagnosis not present

## 2024-01-19 DIAGNOSIS — J9621 Acute and chronic respiratory failure with hypoxia: Secondary | ICD-10-CM | POA: Diagnosis not present

## 2024-01-19 DIAGNOSIS — Z7901 Long term (current) use of anticoagulants: Secondary | ICD-10-CM | POA: Diagnosis not present

## 2024-01-19 DIAGNOSIS — Z6833 Body mass index (BMI) 33.0-33.9, adult: Secondary | ICD-10-CM | POA: Diagnosis not present

## 2024-01-19 DIAGNOSIS — Z96642 Presence of left artificial hip joint: Secondary | ICD-10-CM | POA: Diagnosis not present

## 2024-01-19 DIAGNOSIS — I700 Atherosclerosis of aorta: Secondary | ICD-10-CM | POA: Diagnosis not present

## 2024-01-19 DIAGNOSIS — M1990 Unspecified osteoarthritis, unspecified site: Secondary | ICD-10-CM | POA: Diagnosis not present

## 2024-01-19 DIAGNOSIS — E785 Hyperlipidemia, unspecified: Secondary | ICD-10-CM | POA: Diagnosis not present

## 2024-01-19 DIAGNOSIS — N401 Enlarged prostate with lower urinary tract symptoms: Secondary | ICD-10-CM | POA: Diagnosis not present

## 2024-01-19 DIAGNOSIS — J9622 Acute and chronic respiratory failure with hypercapnia: Secondary | ICD-10-CM | POA: Diagnosis not present

## 2024-01-19 NOTE — Telephone Encounter (Signed)
Copied from CRM [id number]. Topic: Clinical - Home Health Verbal Orders >> Jan 18, 2024  2:41 PM Drema Balzarine wrote: Caller/Agency: [facility]  Callback Number: [phone] Service Requested: Skilled Nursing Frequency:  Any new concerns about the patient? Yes, patient refused visit today due to another appointment, and refused tomorrow because going out of town - COMING BACK ON 2/23

## 2024-01-19 NOTE — Patient Outreach (Deleted)
  Care Management  Transitions of Care Program {VBCITOCTYPE:[id number]}  01/19/2024 Name: HOLDYN POYSER MRN: [id number] DOB: 01/30/1941  Subjective: Todd Gonzales is a 83 y.o. year old male who is a primary care patient of Joaquim Nam, MD. The Care Management team was unable to reach the patient by phone to assess and address transitions of care needs.   Plan: Additional outreach attempts will be made to reach the patient enrolled in the [facility] Program (Post Inpatient/ED Visit).  Deidre Ala, BSN, RN [place]  VBCI - [employer] [facility] RN Care Manager [phone]

## 2024-01-19 NOTE — Telephone Encounter (Signed)
Noted. Thanks.

## 2024-01-19 NOTE — Transitions of Care (Post Inpatient/ED Visit) (Signed)
01/19/2024  Name: Todd Gonzales MRN: [id number] DOB: September 08, 1941  Today's TOC FU Call Status: Today's TOC FU Call Status:: Successful TOC FU Call Completed TOC FU Call Complete Date: 01/19/24 Patient's Name and Date of Birth confirmed.  Transition Care Management Follow-up Telephone Call Date of Discharge: 01/08/24 Discharge Facility: [facility]) Type of Discharge: Inpatient Admission Primary Inpatient Discharge Diagnosis:: A-fib How have you been since you were released from the hospital?: Better Any questions or concerns?: No  Items Reviewed: Did you receive and understand the discharge instructions provided?: Yes Medications obtained,verified, and reconciled?: Yes (Medications Reviewed) Any new allergies since your discharge?: No Dietary orders reviewed?: Yes Type of Diet Ordered:: Low Sodium Heart Healthy Do you have support at home?: Yes People in Home: spouse Name of Support/Comfort Primary Source: Harriett Sine  Medications Reviewed Today: Medications Reviewed Today     Reviewed by Redge Gainer, RN (Case Manager) on 01/19/24 at 1012  Med List Status: <None>   Medication Order Taking? Sig Documenting Provider Last Dose Status Informant  acetaminophen (TYLENOL) 650 MG CR tablet [id number] No Take 1,300 mg by mouth 2 (two) times daily as needed for pain. [provider] Taking Active Self, Care Giver  amiodarone (PACERONE) 100 MG tablet [id number]  Take 1 tablet (100 mg total) by mouth daily. Marykay Lex, MD  Active   amiodarone (PACERONE) 200 MG tablet [id number] No Take 1 tablet (200 mg total) by mouth daily. Arnetha Courser, MD Taking Active   amiodarone (PACERONE) 200 MG tablet [id number]  Take 1 tablet (200 mg total) by mouth daily. Take as needed for afib instructions as followed Amiodarone 400 MG twice daily for three days and then decrease to 400 MG daily for three days. Then will decrease to 200 MG daily times one week. Hold  Metoprolol while taking Amiodarone. RX 2 of 2. Marykay Lex, MD  Active   amiodarone (PACERONE) 400 MG tablet [id number]  Take as needed for afib instructions as followed Amiodarone 400 MG twice daily for three days and then decrease to 400 MG daily for three days. Then will decrease to 200 MG daily times one week. Hold Metoprolol while taking Amiodarone.  RX 1 of 2. Marykay Lex, MD  Active   ascorbic acid (VITAMIN C) 500 MG tablet [id number] No Take 1 tablet (500 mg total) by mouth daily. Arnetha Courser, MD Taking Active   Camphor-Menthol-Methyl Sal [facility]) [id number] No Apply 1 patch topically daily as needed (pain). [provider] Taking Active Self, Care Giver           Med Note Julaine Fusi Oct 31, 2023  9:50 AM)    cyanocobalamin (VITAMIN B12) 1000 MCG/ML injection [id number] No Inject 1 mL (1,000 mcg total) into the muscle every 30 (thirty) days. Joaquim Nam, MD Taking Active Self, Care Giver  diclofenac Sodium (VOLTAREN) 1 % GEL [id number] No Apply 1 Application topically every 6 (six) hours as needed (pain). [provider] Taking Active Self, Care Giver           Med Note Julaine Fusi Oct 31, 2023  9:50 AM)    Everlene Balls 5 MG TABS tablet [id number] No TAKE 1 TABLET BY MOUTH TWICE A DAY Joaquim Nam, MD Taking Active Self, Care Giver  famotidine (PEPCID) 20 MG tablet [id number] No Take 1 tablet (20 mg total) by mouth 2 (two) times daily. Jenel Lucks, MD Taking Active Self, Care  Giver  furosemide (LASIX) 20 MG tablet [id number] No Take 1 tablet (20 mg total) by mouth daily as needed for fluid. Joaquim Nam, MD Taking Active Self, Care Giver           Med Note Para March, Dwana Curd   Fri Jan 12, 2024 10:57 AM)    gabapentin (NEURONTIN) 300 MG capsule [id number] No TAKE ONE CAPSULE BY MOUTH ONCE DAILY Joaquim Nam, MD Taking Active Self, Care Giver  hydrocortisone (PROCTOSOL [facility]) 2.5 % rectal cream [id number] No Place 1 Application  rectally 2 (two) times daily as needed for hemorrhoids or anal itching. Home med. Darlin Priestly, MD Taking Active   iron polysaccharides (NIFEREX) 150 MG capsule [id number] No Take 1 capsule (150 mg total) by mouth daily. Arnetha Courser, MD Taking Active   metoprolol tartrate (LOPRESSOR) 25 MG tablet [id number]  Take 1 tablet (25 mg total) by mouth 2 (two) times daily. Marykay Lex, MD  Active   mometasone (ELOCON) 0.1 % cream [id number] No Spot treat thicker areas on lower legs once to twice daily until improved. Avoid face, groin, axilla. Willeen Niece, MD Taking Active Self, Care Giver           Med Note Para March, Dwana Curd   Fri Jan 12, 2024 10:58 AM)    ondansetron (ZOFRAN) 4 MG tablet [id number] No TAKE ONE TABLET (4 MG TOTAL) BY MOUTH EVERY EIGHT (EIGHT) HOURS AS NEEDED FOR NAUSEA OR VOMITING. Joaquim Nam, MD Taking Active Self, Care Giver           Med Note Para March, Dwana Curd   Fri Jan 12, 2024 10:58 AM)    OVER THE COUNTER MEDICATION [id number] No Place 1-2 sprays into the nose daily as needed (allergies, nasal congestion). Unknown nasal spray. [provider] Taking Active Self, Care Giver           Med Note Nanetta Batty Apr 20, 2023 10:43 AM)    polyethylene glycol (MIRALAX / GLYCOLAX) 17 g packet [id number] No Take 17 g by mouth daily as needed for mild constipation. Osvaldo Shipper, MD Taking Active Self, Care Giver           Med Note Joaquim Nam   Fri Jan 12, 2024 10:58 AM)    tamsulosin (FLOMAX) 0.4 MG CAPS capsule [id number] No TAKE TWO CAPSULE BY MOUTH DAILY Joaquim Nam, MD Taking Active Self, Care Giver  Vitamin D, Ergocalciferol, (DRISDOL) 1.25 MG (50000 UNIT) CAPS capsule [id number] No Take 1 capsule (50,000 Units total) by mouth every 7 (seven) days. Arnetha Courser, MD Taking Active             Home Care and Equipment/Supplies: Were Home Health Services Ordered?: Yes Name of Home Health Agency:: [facility] Has Agency set up a time to come to your  home?: Yes First Home Health Visit Date: 01/17/24 Any new equipment or medical supplies ordered?: NA  Functional Questionnaire: Do you need assistance with bathing/showering or dressing?: No Do you need assistance with meal preparation?: No Do you need assistance with eating?: No Do you have difficulty maintaining continence: No Do you need assistance with getting out of bed/getting out of a chair/moving?: No Do you have difficulty managing or taking your medications?: No  Follow up appointments reviewed:    The patient went to see both his PCP and his Cardiologist. The patient was placed on Amiodarone with specific directions on how to take. The patient voices understanding. He states his  Home Health services have started and he feels weak but getting better. He is going to go out of town for a week for his birthday which he is excited for.     Deidre Ala, BSN, RN [place]  VBCI - [employer] [facility] RN Care Manager [phone]

## 2024-01-30 ENCOUNTER — Other Ambulatory Visit: Payer: Self-pay | Admitting: Family Medicine

## 2024-01-30 DIAGNOSIS — Z96642 Presence of left artificial hip joint: Secondary | ICD-10-CM | POA: Diagnosis not present

## 2024-01-30 DIAGNOSIS — E611 Iron deficiency: Secondary | ICD-10-CM | POA: Diagnosis not present

## 2024-01-30 DIAGNOSIS — J9622 Acute and chronic respiratory failure with hypercapnia: Secondary | ICD-10-CM | POA: Diagnosis not present

## 2024-01-30 DIAGNOSIS — N39498 Other specified urinary incontinence: Secondary | ICD-10-CM | POA: Diagnosis not present

## 2024-01-30 DIAGNOSIS — E785 Hyperlipidemia, unspecified: Secondary | ICD-10-CM | POA: Diagnosis not present

## 2024-01-30 DIAGNOSIS — J9621 Acute and chronic respiratory failure with hypoxia: Secondary | ICD-10-CM | POA: Diagnosis not present

## 2024-01-30 DIAGNOSIS — Z6833 Body mass index (BMI) 33.0-33.9, adult: Secondary | ICD-10-CM | POA: Diagnosis not present

## 2024-01-30 DIAGNOSIS — I5033 Acute on chronic diastolic (congestive) heart failure: Secondary | ICD-10-CM | POA: Diagnosis not present

## 2024-01-30 DIAGNOSIS — I110 Hypertensive heart disease with heart failure: Secondary | ICD-10-CM | POA: Diagnosis not present

## 2024-01-30 DIAGNOSIS — I480 Paroxysmal atrial fibrillation: Secondary | ICD-10-CM | POA: Diagnosis not present

## 2024-01-30 DIAGNOSIS — M1990 Unspecified osteoarthritis, unspecified site: Secondary | ICD-10-CM | POA: Diagnosis not present

## 2024-01-30 DIAGNOSIS — I700 Atherosclerosis of aorta: Secondary | ICD-10-CM | POA: Diagnosis not present

## 2024-01-30 DIAGNOSIS — N401 Enlarged prostate with lower urinary tract symptoms: Secondary | ICD-10-CM | POA: Diagnosis not present

## 2024-01-30 DIAGNOSIS — Z7901 Long term (current) use of anticoagulants: Secondary | ICD-10-CM | POA: Diagnosis not present

## 2024-01-30 DIAGNOSIS — I2510 Atherosclerotic heart disease of native coronary artery without angina pectoris: Secondary | ICD-10-CM | POA: Diagnosis not present

## 2024-02-05 DIAGNOSIS — N39498 Other specified urinary incontinence: Secondary | ICD-10-CM | POA: Diagnosis not present

## 2024-02-05 DIAGNOSIS — Z7901 Long term (current) use of anticoagulants: Secondary | ICD-10-CM | POA: Diagnosis not present

## 2024-02-05 DIAGNOSIS — Z96642 Presence of left artificial hip joint: Secondary | ICD-10-CM | POA: Diagnosis not present

## 2024-02-05 DIAGNOSIS — I110 Hypertensive heart disease with heart failure: Secondary | ICD-10-CM | POA: Diagnosis not present

## 2024-02-05 DIAGNOSIS — N401 Enlarged prostate with lower urinary tract symptoms: Secondary | ICD-10-CM | POA: Diagnosis not present

## 2024-02-05 DIAGNOSIS — I5033 Acute on chronic diastolic (congestive) heart failure: Secondary | ICD-10-CM | POA: Diagnosis not present

## 2024-02-05 DIAGNOSIS — I2510 Atherosclerotic heart disease of native coronary artery without angina pectoris: Secondary | ICD-10-CM | POA: Diagnosis not present

## 2024-02-05 DIAGNOSIS — K080 Exfoliation of teeth due to systemic causes: Secondary | ICD-10-CM | POA: Diagnosis not present

## 2024-02-05 DIAGNOSIS — Z6833 Body mass index (BMI) 33.0-33.9, adult: Secondary | ICD-10-CM | POA: Diagnosis not present

## 2024-02-05 DIAGNOSIS — E611 Iron deficiency: Secondary | ICD-10-CM | POA: Diagnosis not present

## 2024-02-05 DIAGNOSIS — I480 Paroxysmal atrial fibrillation: Secondary | ICD-10-CM | POA: Diagnosis not present

## 2024-02-05 DIAGNOSIS — M1990 Unspecified osteoarthritis, unspecified site: Secondary | ICD-10-CM | POA: Diagnosis not present

## 2024-02-05 DIAGNOSIS — J9621 Acute and chronic respiratory failure with hypoxia: Secondary | ICD-10-CM | POA: Diagnosis not present

## 2024-02-05 DIAGNOSIS — E785 Hyperlipidemia, unspecified: Secondary | ICD-10-CM | POA: Diagnosis not present

## 2024-02-05 DIAGNOSIS — I700 Atherosclerosis of aorta: Secondary | ICD-10-CM | POA: Diagnosis not present

## 2024-02-05 DIAGNOSIS — J9622 Acute and chronic respiratory failure with hypercapnia: Secondary | ICD-10-CM | POA: Diagnosis not present

## 2024-02-06 DIAGNOSIS — N39498 Other specified urinary incontinence: Secondary | ICD-10-CM | POA: Diagnosis not present

## 2024-02-06 DIAGNOSIS — J9621 Acute and chronic respiratory failure with hypoxia: Secondary | ICD-10-CM | POA: Diagnosis not present

## 2024-02-06 DIAGNOSIS — N401 Enlarged prostate with lower urinary tract symptoms: Secondary | ICD-10-CM | POA: Diagnosis not present

## 2024-02-06 DIAGNOSIS — I110 Hypertensive heart disease with heart failure: Secondary | ICD-10-CM | POA: Diagnosis not present

## 2024-02-06 DIAGNOSIS — M1990 Unspecified osteoarthritis, unspecified site: Secondary | ICD-10-CM | POA: Diagnosis not present

## 2024-02-06 DIAGNOSIS — I2510 Atherosclerotic heart disease of native coronary artery without angina pectoris: Secondary | ICD-10-CM | POA: Diagnosis not present

## 2024-02-06 DIAGNOSIS — E611 Iron deficiency: Secondary | ICD-10-CM | POA: Diagnosis not present

## 2024-02-06 DIAGNOSIS — Z6833 Body mass index (BMI) 33.0-33.9, adult: Secondary | ICD-10-CM | POA: Diagnosis not present

## 2024-02-06 DIAGNOSIS — J9622 Acute and chronic respiratory failure with hypercapnia: Secondary | ICD-10-CM | POA: Diagnosis not present

## 2024-02-06 DIAGNOSIS — I5033 Acute on chronic diastolic (congestive) heart failure: Secondary | ICD-10-CM | POA: Diagnosis not present

## 2024-02-06 DIAGNOSIS — I700 Atherosclerosis of aorta: Secondary | ICD-10-CM | POA: Diagnosis not present

## 2024-02-06 DIAGNOSIS — Z7901 Long term (current) use of anticoagulants: Secondary | ICD-10-CM | POA: Diagnosis not present

## 2024-02-06 DIAGNOSIS — I480 Paroxysmal atrial fibrillation: Secondary | ICD-10-CM | POA: Diagnosis not present

## 2024-02-06 DIAGNOSIS — E785 Hyperlipidemia, unspecified: Secondary | ICD-10-CM | POA: Diagnosis not present

## 2024-02-06 DIAGNOSIS — Z96642 Presence of left artificial hip joint: Secondary | ICD-10-CM | POA: Diagnosis not present

## 2024-02-08 DIAGNOSIS — J9621 Acute and chronic respiratory failure with hypoxia: Secondary | ICD-10-CM | POA: Diagnosis not present

## 2024-02-08 DIAGNOSIS — N39498 Other specified urinary incontinence: Secondary | ICD-10-CM | POA: Diagnosis not present

## 2024-02-08 DIAGNOSIS — E611 Iron deficiency: Secondary | ICD-10-CM | POA: Diagnosis not present

## 2024-02-08 DIAGNOSIS — E785 Hyperlipidemia, unspecified: Secondary | ICD-10-CM | POA: Diagnosis not present

## 2024-02-08 DIAGNOSIS — M1990 Unspecified osteoarthritis, unspecified site: Secondary | ICD-10-CM | POA: Diagnosis not present

## 2024-02-08 DIAGNOSIS — I2510 Atherosclerotic heart disease of native coronary artery without angina pectoris: Secondary | ICD-10-CM | POA: Diagnosis not present

## 2024-02-08 DIAGNOSIS — Z6833 Body mass index (BMI) 33.0-33.9, adult: Secondary | ICD-10-CM | POA: Diagnosis not present

## 2024-02-08 DIAGNOSIS — I700 Atherosclerosis of aorta: Secondary | ICD-10-CM | POA: Diagnosis not present

## 2024-02-08 DIAGNOSIS — I110 Hypertensive heart disease with heart failure: Secondary | ICD-10-CM | POA: Diagnosis not present

## 2024-02-08 DIAGNOSIS — I480 Paroxysmal atrial fibrillation: Secondary | ICD-10-CM | POA: Diagnosis not present

## 2024-02-08 DIAGNOSIS — Z96642 Presence of left artificial hip joint: Secondary | ICD-10-CM | POA: Diagnosis not present

## 2024-02-08 DIAGNOSIS — I5033 Acute on chronic diastolic (congestive) heart failure: Secondary | ICD-10-CM | POA: Diagnosis not present

## 2024-02-08 DIAGNOSIS — J9622 Acute and chronic respiratory failure with hypercapnia: Secondary | ICD-10-CM | POA: Diagnosis not present

## 2024-02-08 DIAGNOSIS — Z7901 Long term (current) use of anticoagulants: Secondary | ICD-10-CM | POA: Diagnosis not present

## 2024-02-08 DIAGNOSIS — N401 Enlarged prostate with lower urinary tract symptoms: Secondary | ICD-10-CM | POA: Diagnosis not present

## 2024-02-09 ENCOUNTER — Other Ambulatory Visit: Payer: Self-pay | Admitting: Family Medicine

## 2024-02-09 DIAGNOSIS — E611 Iron deficiency: Secondary | ICD-10-CM | POA: Diagnosis not present

## 2024-02-09 DIAGNOSIS — Z96642 Presence of left artificial hip joint: Secondary | ICD-10-CM | POA: Diagnosis not present

## 2024-02-09 DIAGNOSIS — J9621 Acute and chronic respiratory failure with hypoxia: Secondary | ICD-10-CM | POA: Diagnosis not present

## 2024-02-09 DIAGNOSIS — Z7901 Long term (current) use of anticoagulants: Secondary | ICD-10-CM | POA: Diagnosis not present

## 2024-02-09 DIAGNOSIS — Z87440 Personal history of urinary (tract) infections: Secondary | ICD-10-CM | POA: Diagnosis not present

## 2024-02-09 DIAGNOSIS — Z6833 Body mass index (BMI) 33.0-33.9, adult: Secondary | ICD-10-CM | POA: Diagnosis not present

## 2024-02-09 DIAGNOSIS — I700 Atherosclerosis of aorta: Secondary | ICD-10-CM | POA: Diagnosis not present

## 2024-02-09 DIAGNOSIS — N401 Enlarged prostate with lower urinary tract symptoms: Secondary | ICD-10-CM | POA: Diagnosis not present

## 2024-02-09 DIAGNOSIS — I110 Hypertensive heart disease with heart failure: Secondary | ICD-10-CM | POA: Diagnosis not present

## 2024-02-09 DIAGNOSIS — J9622 Acute and chronic respiratory failure with hypercapnia: Secondary | ICD-10-CM | POA: Diagnosis not present

## 2024-02-09 DIAGNOSIS — E785 Hyperlipidemia, unspecified: Secondary | ICD-10-CM | POA: Diagnosis not present

## 2024-02-09 DIAGNOSIS — J9601 Acute respiratory failure with hypoxia: Secondary | ICD-10-CM | POA: Diagnosis not present

## 2024-02-09 DIAGNOSIS — Z85828 Personal history of other malignant neoplasm of skin: Secondary | ICD-10-CM | POA: Diagnosis not present

## 2024-02-09 DIAGNOSIS — N39498 Other specified urinary incontinence: Secondary | ICD-10-CM | POA: Diagnosis not present

## 2024-02-09 DIAGNOSIS — Z96693 Finger-joint replacement, bilateral: Secondary | ICD-10-CM | POA: Diagnosis not present

## 2024-02-09 DIAGNOSIS — I2510 Atherosclerotic heart disease of native coronary artery without angina pectoris: Secondary | ICD-10-CM | POA: Diagnosis not present

## 2024-02-09 DIAGNOSIS — I5033 Acute on chronic diastolic (congestive) heart failure: Secondary | ICD-10-CM | POA: Diagnosis not present

## 2024-02-09 DIAGNOSIS — M1990 Unspecified osteoarthritis, unspecified site: Secondary | ICD-10-CM | POA: Diagnosis not present

## 2024-02-09 DIAGNOSIS — I480 Paroxysmal atrial fibrillation: Secondary | ICD-10-CM | POA: Diagnosis not present

## 2024-02-12 ENCOUNTER — Ambulatory Visit: Payer: Medicare Other | Admitting: Dermatology

## 2024-02-12 ENCOUNTER — Telehealth: Payer: Self-pay

## 2024-02-12 DIAGNOSIS — J9622 Acute and chronic respiratory failure with hypercapnia: Secondary | ICD-10-CM | POA: Diagnosis not present

## 2024-02-12 DIAGNOSIS — L570 Actinic keratosis: Secondary | ICD-10-CM | POA: Diagnosis not present

## 2024-02-12 DIAGNOSIS — N39498 Other specified urinary incontinence: Secondary | ICD-10-CM | POA: Diagnosis not present

## 2024-02-12 DIAGNOSIS — Z96642 Presence of left artificial hip joint: Secondary | ICD-10-CM | POA: Diagnosis not present

## 2024-02-12 DIAGNOSIS — I5033 Acute on chronic diastolic (congestive) heart failure: Secondary | ICD-10-CM | POA: Diagnosis not present

## 2024-02-12 DIAGNOSIS — Z79899 Other long term (current) drug therapy: Secondary | ICD-10-CM

## 2024-02-12 DIAGNOSIS — N401 Enlarged prostate with lower urinary tract symptoms: Secondary | ICD-10-CM | POA: Diagnosis not present

## 2024-02-12 DIAGNOSIS — Z6833 Body mass index (BMI) 33.0-33.9, adult: Secondary | ICD-10-CM | POA: Diagnosis not present

## 2024-02-12 DIAGNOSIS — I872 Venous insufficiency (chronic) (peripheral): Secondary | ICD-10-CM

## 2024-02-12 DIAGNOSIS — I700 Atherosclerosis of aorta: Secondary | ICD-10-CM | POA: Diagnosis not present

## 2024-02-12 DIAGNOSIS — W908XXA Exposure to other nonionizing radiation, initial encounter: Secondary | ICD-10-CM

## 2024-02-12 DIAGNOSIS — Z7901 Long term (current) use of anticoagulants: Secondary | ICD-10-CM | POA: Diagnosis not present

## 2024-02-12 DIAGNOSIS — C44729 Squamous cell carcinoma of skin of left lower limb, including hip: Secondary | ICD-10-CM | POA: Diagnosis not present

## 2024-02-12 DIAGNOSIS — Z7189 Other specified counseling: Secondary | ICD-10-CM

## 2024-02-12 DIAGNOSIS — L578 Other skin changes due to chronic exposure to nonionizing radiation: Secondary | ICD-10-CM

## 2024-02-12 DIAGNOSIS — E611 Iron deficiency: Secondary | ICD-10-CM | POA: Diagnosis not present

## 2024-02-12 DIAGNOSIS — Z5111 Encounter for antineoplastic chemotherapy: Secondary | ICD-10-CM | POA: Diagnosis not present

## 2024-02-12 DIAGNOSIS — J9621 Acute and chronic respiratory failure with hypoxia: Secondary | ICD-10-CM | POA: Diagnosis not present

## 2024-02-12 DIAGNOSIS — I2510 Atherosclerotic heart disease of native coronary artery without angina pectoris: Secondary | ICD-10-CM | POA: Diagnosis not present

## 2024-02-12 DIAGNOSIS — M1990 Unspecified osteoarthritis, unspecified site: Secondary | ICD-10-CM | POA: Diagnosis not present

## 2024-02-12 DIAGNOSIS — I480 Paroxysmal atrial fibrillation: Secondary | ICD-10-CM | POA: Diagnosis not present

## 2024-02-12 DIAGNOSIS — E785 Hyperlipidemia, unspecified: Secondary | ICD-10-CM | POA: Diagnosis not present

## 2024-02-12 DIAGNOSIS — D492 Neoplasm of unspecified behavior of bone, soft tissue, and skin: Secondary | ICD-10-CM

## 2024-02-12 DIAGNOSIS — I110 Hypertensive heart disease with heart failure: Secondary | ICD-10-CM | POA: Diagnosis not present

## 2024-02-12 MED ORDER — FLUOROURACIL 5 % EX CREA: TOPICAL_CREAM | Freq: Two times a day (BID) | CUTANEOUS | 2 refills | Status: AC

## 2024-02-12 MED ORDER — TRIAMCINOLONE ACETONIDE 0.1 % EX CREA: 1.0000 | TOPICAL_CREAM | Freq: Every day | CUTANEOUS | 1 refills | Status: DC

## 2024-02-12 NOTE — Telephone Encounter (Signed)
 Left pt msg on both lines to call about todays appointment.  Need to discuss Valtrex prophylactic treatment./sh

## 2024-02-12 NOTE — Patient Instructions (Addendum)
 Start 5-fluorouracil/calcipotriene cream twice a day for 5-7 days to affected areas including temples, cheeks, nose and forehead. Prescription sent to [facility]. Patient advised they will receive an email to purchase the medication online and have it sent to their home. Patient provided with handout reviewing treatment course and side effects and advised to call or message [place] on MyChart with any concerns.   Instructions for [facility] Medications  One or more of your medications was sent to the [facility] mail order [facility] pharmacy. You will receive an email from them and can purchase the medicine through that link. It will then be mailed to your home at the address you confirmed. If for any reason you do not receive an email from them, please check your spam folder. If you still do not find the email, please let us know. [facility] phone number is [phone].   5-Fluorouracil/Calcipotriene Patient Education   Actinic keratoses are the dry, red scaly spots on the skin caused by sun damage. A portion of these spots can turn into skin cancer with time, and treating them can help prevent development of skin cancer.   Treatment of these spots requires removal of the defective skin cells. There are various ways to remove actinic keratoses, including freezing with liquid nitrogen, treatment with creams, or treatment with a blue light procedure in the office.   5-fluorouracil cream is a topical cream used to treat actinic keratoses. It works by interfering with the growth of abnormal fast-growing skin cells, such as actinic keratoses. These cells peel off and are replaced by healthy ones.   5-fluorouracil/calcipotriene is a combination of the 5-fluorouracil cream with a vitamin D analog cream called calcipotriene. The calcipotriene alone does not treat actinic keratoses. However, when it is combined with 5-fluorouracil, it helps the 5-fluorouracil treat the  actinic keratoses much faster so that the same results can be achieved with a much shorter treatment time.  INSTRUCTIONS FOR 5-FLUOROURACIL/CALCIPOTRIENE CREAM:   5-fluorouracil/calcipotriene cream typically only needs to be used for 4-7 days. A thin layer should be applied twice a day to the treatment areas recommended by your physician.   If your physician prescribed you separate tubes of 5-fluourouracil and calcipotriene, apply a thin layer of 5-fluorouracil followed by a thin layer of calcipotriene.   Avoid contact with your eyes, nostrils, and mouth. Do not use 5-fluorouracil/calcipotriene cream on infected or open wounds.   You will develop redness, irritation and some crusting at areas where you have pre-cancer damage/actinic keratoses. IF YOU DEVELOP PAIN, BLEEDING, OR SIGNIFICANT CRUSTING, STOP THE TREATMENT EARLY - you have already gotten a good response and the actinic keratoses should clear up well.  Wash your hands after applying 5-fluorouracil 5% cream on your skin.   A moisturizer or sunscreen with a minimum SPF 30 should be applied each morning.   Once you have finished the treatment, you can apply a thin layer of Vaseline twice a day to irritated areas to soothe and calm the areas more quickly. If you experience significant discomfort, contact your physician.  For some patients it is necessary to repeat the treatment for best results.  SIDE EFFECTS: When using 5-fluorouracil/calcipotriene cream, you may have mild irritation, such as redness, dryness, swelling, or a mild burning sensation. This usually resolves within 2 weeks. The more actinic keratoses you have, the more redness and inflammation you can expect during treatment. Eye irritation has been reported rarely. If this occurs, please let us know.  If you  have any trouble using this cream, please call the office. If you have any other questions about this information, please do not hesitate to ask me before you leave the  office.   Wound Care Instructions  Cleanse wound gently with soap and water once a day then pat dry with clean gauze. Apply a thin coat of Petrolatum (petroleum jelly, "Vaseline") over the wound (unless you have an allergy to this). We recommend that you use a new, sterile tube of Vaseline. Do not pick or remove scabs. Do not remove the yellow or white "healing tissue" from the base of the wound.  Cover the wound with fresh, clean, nonstick gauze and secure with paper tape. You may use Band-Aids in place of gauze and tape if the wound is small enough, but would recommend trimming much of the tape off as there is often too much. Sometimes Band-Aids can irritate the skin.  You should call the office for your biopsy report after 1 week if you have not already been contacted.  If you experience any problems, such as abnormal amounts of bleeding, swelling, significant bruising, significant pain, or evidence of infection, please call the office immediately.  FOR ADULT SURGERY PATIENTS: If you need something for pain relief you may take 1 extra strength Tylenol (acetaminophen) AND 2 Ibuprofen (200mg  each) together every 4 hours as needed for pain. (do not take these if you are allergic to them or if you have a reason you should not take them.) Typically, you may only need pain medication for 1 to 3 days.         Due to recent changes in healthcare laws, you may see results of your pathology and/or laboratory studies on MyChart before the doctors have had a chance to review them. We understand that in some cases there may be results that are confusing or concerning to you. Please understand that not all results are received at the same time and often the doctors may need to interpret multiple results in order to provide you with the best plan of care or course of treatment. Therefore, we ask that you please give [place] 2 business days to thoroughly review all your results before contacting the office for  clarification. Should we see a critical lab result, you will be contacted sooner.   If You Need Anything After Your Visit  If you have any questions or concerns for your doctor, please call our main line at [phone] and press option 4 to reach your doctor's medical assistant. If no one answers, please leave a voicemail as directed and we will return your call as soon as possible. Messages left after 4 pm will be answered the following business day.   You may also send [place] a message via MyChart. We typically respond to MyChart messages within 1-2 business days.  For prescription refills, please ask your pharmacy to contact our office. Our fax number is [phone].  If you have an urgent issue when the clinic is closed that cannot wait until the next business day, you can page your doctor at the number below.    Please note that while we do our best to be available for urgent issues outside of office hours, we are not available 24/7.   If you have an urgent issue and are unable to reach [place], you may choose to seek medical care at your doctor's office, retail clinic, urgent care center, or emergency room.  If you have a medical emergency, please immediately call  911 or go to the emergency department.  Pager Numbers  - Dr. Gwen Pounds: [phone]  - Dr. Roseanne Reno: [phone]  - Dr. Katrinka Blazing: [phone]   In the event of inclement weather, please call our main line at [phone] for an update on the status of any delays or closures.  Dermatology Medication Tips: Please keep the boxes that topical medications come in in order to help keep track of the instructions about where and how to use these. Pharmacies typically print the medication instructions only on the boxes and not directly on the medication tubes.   If your medication is too expensive, please contact our office at [phone] option 4 or send [place] a message through MyChart.   We are unable to tell what your co-pay for  medications will be in advance as this is different depending on your insurance coverage. However, we may be able to find a substitute medication at lower cost or fill out paperwork to get insurance to cover a needed medication.   If a prior authorization is required to get your medication covered by your insurance company, please allow [place] 1-2 business days to complete this process.  Drug prices often vary depending on where the prescription is filled and some pharmacies may offer cheaper prices.  The website www.goodrx.com contains coupons for medications through different pharmacies. The prices here do not account for what the cost may be with help from insurance (it may be cheaper with your insurance), but the website can give you the price if you did not use any insurance.  - You can print the associated coupon and take it with your prescription to the pharmacy.  - You may also stop by our office during regular business hours and pick up a GoodRx coupon card.  - If you need your prescription sent electronically to a different pharmacy, notify our office through [facility] or by phone at [phone] option 4.     Si Usted Necesita Algo Despus de Su Visita  Tambin puede enviarnos un mensaje a travs de Clinical cytogeneticist. Por lo general respondemos a los mensajes de MyChart en el transcurso de 1 a 2 das hbiles.  Para renovar recetas, por favor pida a su farmacia que se ponga en contacto con nuestra oficina. Annie Sable de fax es [place] [phone].  Si tiene un asunto urgente cuando la clnica est cerrada y que no puede esperar hasta el siguiente da hbil, puede llamar/localizar a su doctor(a) al nmero que aparece a continuacin.   Por favor, tenga en cuenta que aunque hacemos todo lo posible para estar disponibles para asuntos urgentes fuera del horario de [place], no estamos disponibles las 24 horas del da, los 7 [address]  [address] de la [place].   Si tiene un problema urgente y no puede  comunicarse con nosotros, puede optar por buscar atencin mdica  en el consultorio de su doctor(a), en una clnica privada, en un centro de atencin urgente o en una sala de emergencias.  Si tiene Engineer, drilling, por favor llame inmediatamente al 911 o vaya a la sala de emergencias.  Nmeros de bper  - Dr. Gwen Pounds: [phone]  - Dra. Roseanne Reno: [phone]  - Dr. Katrinka Blazing: [phone]   En caso de inclemencias del tiempo, por favor llame a Lacy Duverney principal al [phone] para una actualizacin sobre el [place] de cualquier retraso o cierre.  Consejos para la medicacin en dermatologa: Por favor, guarde las cajas en las que vienen los medicamentos de uso tpico para ayudarle a Designer, jewellery las  instrucciones sobre dnde y cmo usarlos. Las farmacias generalmente imprimen las instrucciones del medicamento slo en las cajas y no directamente en los tubos del [place].   Si su medicamento es muy caro, por favor, pngase en contacto con Rolm Gala llamando al [phone] y presione la opcin 4 o envenos un mensaje a travs de Clinical cytogeneticist.   No podemos decirle cul ser su copago por los medicamentos por adelantado ya que esto es diferente dependiendo de la cobertura de su seguro. Sin embargo, es posible que podamos encontrar un medicamento sustituto a Audiological scientist un formulario para que el seguro cubra el medicamento que se considera necesario.   Si se requiere una autorizacin previa para que su compaa de seguros [place] su medicamento, por favor permtanos de 1 a 2 das hbiles para completar [address]  Los precios de los medicamentos varan con frecuencia dependiendo del Environmental consultant de dnde se surte la receta y alguna farmacias pueden ofrecer precios ms baratos.  El sitio web www.goodrx.com tiene cupones para medicamentos de Health and safety inspector. Los precios aqu no tienen en cuenta lo que podra costar con la ayuda del seguro (puede ser ms barato con su seguro), pero  el sitio web puede darle el precio si no utiliz Tourist information centre manager.  - Puede imprimir el cupn correspondiente y llevarlo con su receta a la farmacia.  - Tambin puede pasar por nuestra oficina durante el horario de atencin regular y Education officer, museum una tarjeta de cupones de GoodRx.  - Si necesita que su receta se enve electrnicamente a una farmacia diferente, informe a nuestra oficina a travs de MyChart de  o por telfono llamando al [phone] y presione la opcin 4.

## 2024-02-12 NOTE — Progress Notes (Signed)
 Follow-Up Visit   Subjective  Todd Gonzales is a 83 y.o. male who presents for the following: Aks with actinic damage face, pt does not want to do chemical peel on face today, check spot L lower leg ~1 month, check rash R lower leg, just noticed after recent hospitalization with A fib where he had leg swelling. The patient has spots, moles and lesions to be evaluated, some may be new or changing and the patient may have concern these could be cancer.   The following portions of the chart were reviewed this encounter and updated as appropriate: medications, allergies, medical history  Review of Systems:  No other skin or systemic complaints except as noted in HPI or Assessment and Plan.  Objective  Well appearing patient in no apparent distress; mood and affect are within normal limits.    A focused examination was performed of the following areas: face  Relevant exam findings are noted in the Assessment and Plan.  L lateral upper pretibia 9.75mm pink keratotic pap   Assessment & Plan   STASIS DERMATITIS Exam: Erythematous, scaly patches involving the R>L ankle and distal lower leg with associated lower leg edema.  Chronic and persistent condition with duration or expected duration over one year. Condition is symptomatic/ bothersome to patient. Not currently at goal.   Stasis in the legs causes chronic leg swelling, which may result in itchy or painful rashes, skin discoloration, skin texture changes, and sometimes ulceration.  Recommend daily graduated compression hose/stockings- easiest to put on first thing in morning, remove at bedtime.  Elevate legs as much as possible. Avoid salt/sodium rich foods.  Treatment Plan: Start TMC 0.1% cr qd/bid aa legs until itchy rash clear or up to 2 weeks, avoid f/g/a  Topical steroids (such as triamcinolone, fluocinolone, fluocinonide, mometasone, clobetasol, halobetasol, betamethasone, hydrocortisone) can cause thinning and lightening of  the skin if they are used for too long in the same area. Your physician has selected the right strength medicine for your problem and area affected on the body. Please use your medication only as directed by your physician to prevent side effects.    ACTINIC DAMAGE WITH PRECANCEROUS ACTINIC KERATOSES Counseling for Topical Chemotherapy Management: Patient exhibits: - Severe, confluent actinic changes with pre-cancerous actinic keratoses that is secondary to cumulative UV radiation exposure over time - Condition that is severe; chronic, not at goal. - diffuse scaly erythematous macules and papules with underlying dyspigmentation - Discussed Prescription "Field Treatment" topical Chemotherapy for Severe, Chronic Confluent Actinic Changes with Pre-Cancerous Actinic Keratoses Field treatment involves treatment of an entire area of skin that has confluent Actinic Changes (Sun/ Ultraviolet light damage) and PreCancerous Actinic Keratoses by method of PhotoDynamic Therapy (PDT) and/or prescription Topical Chemotherapy agents such as 5-fluorouracil, 5-fluorouracil/calcipotriene, and/or imiquimod.  The purpose is to decrease the number of clinically evident and subclinical PreCancerous lesions to prevent progression to development of skin cancer by chemically destroying early precancer changes that may or may not be visible.  It has been shown to reduce the risk of developing skin cancer in the treated area. As a result of treatment, redness, scaling, crusting, and open sores may occur during treatment course. One or more than one of these methods may be used and may have to be used several times to control, suppress and eliminate the PreCancerous changes. Discussed treatment course, expected reaction, and possible side effects. - Recommend daily broad spectrum sunscreen SPF 30+ to sun-exposed areas, reapply every 2 hours as needed.  -  Staying in the shade or wearing long sleeves, sun glasses (UVA+UVB protection)  and wide brim hats (4-inch brim around the entire circumference of the hat) are also recommended. - Call for new or changing lesions.  - Start 5-fluorouracil/calcipotriene cream twice a day for 5-7 days to affected areas including temples, cheeks, nose and forehead. Prescription sent to [facility]. Patient advised they will receive an email to purchase the medication online and have it sent to their home. Patient provided with handout reviewing treatment course and side effects and advised to call or message [place] on MyChart with any concerns.  Reviewed course of treatment and expected reaction.  Patient advised to expect inflammation and crusting and advised that erosions are possible.  Patient advised to be diligent with sun protection during and after treatment. Counseled to keep medication out of reach of children and pets.  NEOPLASM OF SKIN L lateral upper pretibia Epidermal / dermal shaving  Lesion diameter (cm):  0.9 Informed consent: discussed and consent obtained   Patient was prepped and draped in usual sterile fashion: area prepped with alcohol. Anesthesia: the lesion was anesthetized in a standard fashion   Anesthetic:  1% lidocaine w/ epinephrine 1-100,000 buffered w/ 8.4% NaHCO3 Instrument used: flexible razor blade   Hemostasis achieved with: pressure, aluminum chloride and electrodesiccation   Outcome: patient tolerated procedure well    Destruction of lesion  Destruction method: electrodesiccation and curettage   Informed consent: discussed and consent obtained   Curettage performed in three different directions: Yes   Electrodesiccation performed over the curetted area: Yes   Final wound size (cm):  1 Hemostasis achieved with:  pressure, aluminum chloride and electrodesiccation Outcome: patient tolerated procedure well with no complications   Post-procedure details: wound care instructions given   Additional details:  Mupirocin ointment and Bandaid  applied  Specimen 1 - Surgical pathology Differential Diagnosis: R/O SCC  Check Margins: No 9.9mm pink keratotic pap EDC  Return for 3-4 months [place] f/u.  I, Ardis Rowan, RMA, am acting as scribe for Willeen Niece, MD .   Documentation: I have reviewed the above documentation for accuracy and completeness, and I agree with the above.  Willeen Niece, MD

## 2024-02-13 ENCOUNTER — Ambulatory Visit (INDEPENDENT_AMBULATORY_CARE_PROVIDER_SITE_OTHER): Payer: Medicare Other

## 2024-02-13 DIAGNOSIS — E538 Deficiency of other specified B group vitamins: Secondary | ICD-10-CM

## 2024-02-13 MED ORDER — CYANOCOBALAMIN 1000 MCG/ML IJ SOLN
1000.0000 ug | Freq: Once | INTRAMUSCULAR | Status: AC
  Administered 2024-02-13: 1000 ug via INTRAMUSCULAR

## 2024-02-13 NOTE — Progress Notes (Signed)
 Per orders of Dr. Crawford Givens, injection of B-12 given by Eual Fines in right deltoid. Patient tolerated injection well. Patient will make appointment for 31 day (for insurance).

## 2024-02-14 DIAGNOSIS — K080 Exfoliation of teeth due to systemic causes: Secondary | ICD-10-CM | POA: Diagnosis not present

## 2024-02-15 DIAGNOSIS — K080 Exfoliation of teeth due to systemic causes: Secondary | ICD-10-CM | POA: Diagnosis not present

## 2024-02-15 LAB — SURGICAL PATHOLOGY

## 2024-02-16 DIAGNOSIS — Z6833 Body mass index (BMI) 33.0-33.9, adult: Secondary | ICD-10-CM | POA: Diagnosis not present

## 2024-02-16 DIAGNOSIS — I2510 Atherosclerotic heart disease of native coronary artery without angina pectoris: Secondary | ICD-10-CM | POA: Diagnosis not present

## 2024-02-16 DIAGNOSIS — N401 Enlarged prostate with lower urinary tract symptoms: Secondary | ICD-10-CM | POA: Diagnosis not present

## 2024-02-16 DIAGNOSIS — I700 Atherosclerosis of aorta: Secondary | ICD-10-CM | POA: Diagnosis not present

## 2024-02-16 DIAGNOSIS — E785 Hyperlipidemia, unspecified: Secondary | ICD-10-CM | POA: Diagnosis not present

## 2024-02-16 DIAGNOSIS — J9621 Acute and chronic respiratory failure with hypoxia: Secondary | ICD-10-CM | POA: Diagnosis not present

## 2024-02-16 DIAGNOSIS — N39498 Other specified urinary incontinence: Secondary | ICD-10-CM | POA: Diagnosis not present

## 2024-02-16 DIAGNOSIS — J9622 Acute and chronic respiratory failure with hypercapnia: Secondary | ICD-10-CM | POA: Diagnosis not present

## 2024-02-16 DIAGNOSIS — I480 Paroxysmal atrial fibrillation: Secondary | ICD-10-CM | POA: Diagnosis not present

## 2024-02-16 DIAGNOSIS — M1990 Unspecified osteoarthritis, unspecified site: Secondary | ICD-10-CM | POA: Diagnosis not present

## 2024-02-16 DIAGNOSIS — Z96642 Presence of left artificial hip joint: Secondary | ICD-10-CM | POA: Diagnosis not present

## 2024-02-16 DIAGNOSIS — I5033 Acute on chronic diastolic (congestive) heart failure: Secondary | ICD-10-CM | POA: Diagnosis not present

## 2024-02-16 DIAGNOSIS — Z7901 Long term (current) use of anticoagulants: Secondary | ICD-10-CM | POA: Diagnosis not present

## 2024-02-16 DIAGNOSIS — E611 Iron deficiency: Secondary | ICD-10-CM | POA: Diagnosis not present

## 2024-02-16 DIAGNOSIS — I110 Hypertensive heart disease with heart failure: Secondary | ICD-10-CM | POA: Diagnosis not present

## 2024-02-19 ENCOUNTER — Telehealth: Payer: Self-pay

## 2024-02-19 NOTE — Telephone Encounter (Signed)
 Advised patient's wife, Harriett Sine, of bx results./sh

## 2024-02-19 NOTE — Telephone Encounter (Signed)
-----   Message from Willeen Niece sent at 02/19/2024  1:09 PM EDT ----- 1. Skin, L lateral upper pretibia :       SQUAMOUS CELL CARCINOMA, KERATOACANTHOMA TYPE   SCC skin cancer- already treated with EDC at time of biopsy   - please call patient

## 2024-02-20 DIAGNOSIS — Z7901 Long term (current) use of anticoagulants: Secondary | ICD-10-CM | POA: Diagnosis not present

## 2024-02-20 DIAGNOSIS — Z96642 Presence of left artificial hip joint: Secondary | ICD-10-CM | POA: Diagnosis not present

## 2024-02-20 DIAGNOSIS — I5033 Acute on chronic diastolic (congestive) heart failure: Secondary | ICD-10-CM | POA: Diagnosis not present

## 2024-02-20 DIAGNOSIS — I480 Paroxysmal atrial fibrillation: Secondary | ICD-10-CM | POA: Diagnosis not present

## 2024-02-20 DIAGNOSIS — E785 Hyperlipidemia, unspecified: Secondary | ICD-10-CM | POA: Diagnosis not present

## 2024-02-20 DIAGNOSIS — N39498 Other specified urinary incontinence: Secondary | ICD-10-CM | POA: Diagnosis not present

## 2024-02-20 DIAGNOSIS — I2510 Atherosclerotic heart disease of native coronary artery without angina pectoris: Secondary | ICD-10-CM | POA: Diagnosis not present

## 2024-02-20 DIAGNOSIS — N401 Enlarged prostate with lower urinary tract symptoms: Secondary | ICD-10-CM | POA: Diagnosis not present

## 2024-02-20 DIAGNOSIS — M1990 Unspecified osteoarthritis, unspecified site: Secondary | ICD-10-CM | POA: Diagnosis not present

## 2024-02-20 DIAGNOSIS — I700 Atherosclerosis of aorta: Secondary | ICD-10-CM | POA: Diagnosis not present

## 2024-02-20 DIAGNOSIS — I110 Hypertensive heart disease with heart failure: Secondary | ICD-10-CM | POA: Diagnosis not present

## 2024-02-20 DIAGNOSIS — Z6833 Body mass index (BMI) 33.0-33.9, adult: Secondary | ICD-10-CM | POA: Diagnosis not present

## 2024-02-20 DIAGNOSIS — J9622 Acute and chronic respiratory failure with hypercapnia: Secondary | ICD-10-CM | POA: Diagnosis not present

## 2024-02-20 DIAGNOSIS — J9621 Acute and chronic respiratory failure with hypoxia: Secondary | ICD-10-CM | POA: Diagnosis not present

## 2024-02-20 DIAGNOSIS — E611 Iron deficiency: Secondary | ICD-10-CM | POA: Diagnosis not present

## 2024-02-22 DIAGNOSIS — Z6833 Body mass index (BMI) 33.0-33.9, adult: Secondary | ICD-10-CM | POA: Diagnosis not present

## 2024-02-22 DIAGNOSIS — M1990 Unspecified osteoarthritis, unspecified site: Secondary | ICD-10-CM | POA: Diagnosis not present

## 2024-02-22 DIAGNOSIS — E611 Iron deficiency: Secondary | ICD-10-CM | POA: Diagnosis not present

## 2024-02-22 DIAGNOSIS — Z96642 Presence of left artificial hip joint: Secondary | ICD-10-CM | POA: Diagnosis not present

## 2024-02-22 DIAGNOSIS — Z7901 Long term (current) use of anticoagulants: Secondary | ICD-10-CM | POA: Diagnosis not present

## 2024-02-22 DIAGNOSIS — I2510 Atherosclerotic heart disease of native coronary artery without angina pectoris: Secondary | ICD-10-CM | POA: Diagnosis not present

## 2024-02-22 DIAGNOSIS — J9621 Acute and chronic respiratory failure with hypoxia: Secondary | ICD-10-CM | POA: Diagnosis not present

## 2024-02-22 DIAGNOSIS — N401 Enlarged prostate with lower urinary tract symptoms: Secondary | ICD-10-CM | POA: Diagnosis not present

## 2024-02-22 DIAGNOSIS — I5033 Acute on chronic diastolic (congestive) heart failure: Secondary | ICD-10-CM | POA: Diagnosis not present

## 2024-02-22 DIAGNOSIS — J9622 Acute and chronic respiratory failure with hypercapnia: Secondary | ICD-10-CM | POA: Diagnosis not present

## 2024-02-22 DIAGNOSIS — I110 Hypertensive heart disease with heart failure: Secondary | ICD-10-CM | POA: Diagnosis not present

## 2024-02-22 DIAGNOSIS — I480 Paroxysmal atrial fibrillation: Secondary | ICD-10-CM | POA: Diagnosis not present

## 2024-02-22 DIAGNOSIS — N39498 Other specified urinary incontinence: Secondary | ICD-10-CM | POA: Diagnosis not present

## 2024-02-22 DIAGNOSIS — E785 Hyperlipidemia, unspecified: Secondary | ICD-10-CM | POA: Diagnosis not present

## 2024-02-22 DIAGNOSIS — I700 Atherosclerosis of aorta: Secondary | ICD-10-CM | POA: Diagnosis not present

## 2024-02-23 DIAGNOSIS — Z96642 Presence of left artificial hip joint: Secondary | ICD-10-CM | POA: Diagnosis not present

## 2024-02-23 DIAGNOSIS — N39498 Other specified urinary incontinence: Secondary | ICD-10-CM | POA: Diagnosis not present

## 2024-02-23 DIAGNOSIS — E611 Iron deficiency: Secondary | ICD-10-CM | POA: Diagnosis not present

## 2024-02-23 DIAGNOSIS — M1990 Unspecified osteoarthritis, unspecified site: Secondary | ICD-10-CM | POA: Diagnosis not present

## 2024-02-23 DIAGNOSIS — Z7901 Long term (current) use of anticoagulants: Secondary | ICD-10-CM | POA: Diagnosis not present

## 2024-02-23 DIAGNOSIS — I700 Atherosclerosis of aorta: Secondary | ICD-10-CM | POA: Diagnosis not present

## 2024-02-23 DIAGNOSIS — E785 Hyperlipidemia, unspecified: Secondary | ICD-10-CM | POA: Diagnosis not present

## 2024-02-23 DIAGNOSIS — I480 Paroxysmal atrial fibrillation: Secondary | ICD-10-CM | POA: Diagnosis not present

## 2024-02-23 DIAGNOSIS — I5033 Acute on chronic diastolic (congestive) heart failure: Secondary | ICD-10-CM | POA: Diagnosis not present

## 2024-02-23 DIAGNOSIS — I2510 Atherosclerotic heart disease of native coronary artery without angina pectoris: Secondary | ICD-10-CM | POA: Diagnosis not present

## 2024-02-23 DIAGNOSIS — I110 Hypertensive heart disease with heart failure: Secondary | ICD-10-CM | POA: Diagnosis not present

## 2024-02-23 DIAGNOSIS — N401 Enlarged prostate with lower urinary tract symptoms: Secondary | ICD-10-CM | POA: Diagnosis not present

## 2024-02-23 DIAGNOSIS — J9622 Acute and chronic respiratory failure with hypercapnia: Secondary | ICD-10-CM | POA: Diagnosis not present

## 2024-02-23 DIAGNOSIS — Z6833 Body mass index (BMI) 33.0-33.9, adult: Secondary | ICD-10-CM | POA: Diagnosis not present

## 2024-02-23 DIAGNOSIS — J9621 Acute and chronic respiratory failure with hypoxia: Secondary | ICD-10-CM | POA: Diagnosis not present

## 2024-02-27 DIAGNOSIS — I2510 Atherosclerotic heart disease of native coronary artery without angina pectoris: Secondary | ICD-10-CM | POA: Diagnosis not present

## 2024-02-27 DIAGNOSIS — N39498 Other specified urinary incontinence: Secondary | ICD-10-CM | POA: Diagnosis not present

## 2024-02-27 DIAGNOSIS — I110 Hypertensive heart disease with heart failure: Secondary | ICD-10-CM | POA: Diagnosis not present

## 2024-02-27 DIAGNOSIS — E611 Iron deficiency: Secondary | ICD-10-CM | POA: Diagnosis not present

## 2024-02-27 DIAGNOSIS — Z6833 Body mass index (BMI) 33.0-33.9, adult: Secondary | ICD-10-CM | POA: Diagnosis not present

## 2024-02-27 DIAGNOSIS — J9621 Acute and chronic respiratory failure with hypoxia: Secondary | ICD-10-CM | POA: Diagnosis not present

## 2024-02-27 DIAGNOSIS — J9622 Acute and chronic respiratory failure with hypercapnia: Secondary | ICD-10-CM | POA: Diagnosis not present

## 2024-02-27 DIAGNOSIS — M1990 Unspecified osteoarthritis, unspecified site: Secondary | ICD-10-CM | POA: Diagnosis not present

## 2024-02-27 DIAGNOSIS — I5033 Acute on chronic diastolic (congestive) heart failure: Secondary | ICD-10-CM | POA: Diagnosis not present

## 2024-02-27 DIAGNOSIS — E785 Hyperlipidemia, unspecified: Secondary | ICD-10-CM | POA: Diagnosis not present

## 2024-02-27 DIAGNOSIS — Z96642 Presence of left artificial hip joint: Secondary | ICD-10-CM | POA: Diagnosis not present

## 2024-02-27 DIAGNOSIS — I700 Atherosclerosis of aorta: Secondary | ICD-10-CM | POA: Diagnosis not present

## 2024-02-27 DIAGNOSIS — I480 Paroxysmal atrial fibrillation: Secondary | ICD-10-CM | POA: Diagnosis not present

## 2024-02-27 DIAGNOSIS — Z7901 Long term (current) use of anticoagulants: Secondary | ICD-10-CM | POA: Diagnosis not present

## 2024-02-27 DIAGNOSIS — N401 Enlarged prostate with lower urinary tract symptoms: Secondary | ICD-10-CM | POA: Diagnosis not present

## 2024-02-29 DIAGNOSIS — J9622 Acute and chronic respiratory failure with hypercapnia: Secondary | ICD-10-CM | POA: Diagnosis not present

## 2024-02-29 DIAGNOSIS — I2510 Atherosclerotic heart disease of native coronary artery without angina pectoris: Secondary | ICD-10-CM | POA: Diagnosis not present

## 2024-02-29 DIAGNOSIS — N39498 Other specified urinary incontinence: Secondary | ICD-10-CM | POA: Diagnosis not present

## 2024-02-29 DIAGNOSIS — Z6833 Body mass index (BMI) 33.0-33.9, adult: Secondary | ICD-10-CM | POA: Diagnosis not present

## 2024-02-29 DIAGNOSIS — J9621 Acute and chronic respiratory failure with hypoxia: Secondary | ICD-10-CM | POA: Diagnosis not present

## 2024-02-29 DIAGNOSIS — I480 Paroxysmal atrial fibrillation: Secondary | ICD-10-CM | POA: Diagnosis not present

## 2024-02-29 DIAGNOSIS — N401 Enlarged prostate with lower urinary tract symptoms: Secondary | ICD-10-CM | POA: Diagnosis not present

## 2024-02-29 DIAGNOSIS — Z7901 Long term (current) use of anticoagulants: Secondary | ICD-10-CM | POA: Diagnosis not present

## 2024-02-29 DIAGNOSIS — Z96642 Presence of left artificial hip joint: Secondary | ICD-10-CM | POA: Diagnosis not present

## 2024-02-29 DIAGNOSIS — I5033 Acute on chronic diastolic (congestive) heart failure: Secondary | ICD-10-CM | POA: Diagnosis not present

## 2024-02-29 DIAGNOSIS — E785 Hyperlipidemia, unspecified: Secondary | ICD-10-CM | POA: Diagnosis not present

## 2024-02-29 DIAGNOSIS — M1990 Unspecified osteoarthritis, unspecified site: Secondary | ICD-10-CM | POA: Diagnosis not present

## 2024-02-29 DIAGNOSIS — I700 Atherosclerosis of aorta: Secondary | ICD-10-CM | POA: Diagnosis not present

## 2024-02-29 DIAGNOSIS — E611 Iron deficiency: Secondary | ICD-10-CM | POA: Diagnosis not present

## 2024-02-29 DIAGNOSIS — I110 Hypertensive heart disease with heart failure: Secondary | ICD-10-CM | POA: Diagnosis not present

## 2024-03-05 DIAGNOSIS — J9621 Acute and chronic respiratory failure with hypoxia: Secondary | ICD-10-CM | POA: Diagnosis not present

## 2024-03-05 DIAGNOSIS — I700 Atherosclerosis of aorta: Secondary | ICD-10-CM | POA: Diagnosis not present

## 2024-03-05 DIAGNOSIS — J9622 Acute and chronic respiratory failure with hypercapnia: Secondary | ICD-10-CM | POA: Diagnosis not present

## 2024-03-05 DIAGNOSIS — N401 Enlarged prostate with lower urinary tract symptoms: Secondary | ICD-10-CM | POA: Diagnosis not present

## 2024-03-05 DIAGNOSIS — E785 Hyperlipidemia, unspecified: Secondary | ICD-10-CM | POA: Diagnosis not present

## 2024-03-05 DIAGNOSIS — N39498 Other specified urinary incontinence: Secondary | ICD-10-CM | POA: Diagnosis not present

## 2024-03-05 DIAGNOSIS — Z96642 Presence of left artificial hip joint: Secondary | ICD-10-CM | POA: Diagnosis not present

## 2024-03-05 DIAGNOSIS — I110 Hypertensive heart disease with heart failure: Secondary | ICD-10-CM | POA: Diagnosis not present

## 2024-03-05 DIAGNOSIS — I480 Paroxysmal atrial fibrillation: Secondary | ICD-10-CM | POA: Diagnosis not present

## 2024-03-05 DIAGNOSIS — I5033 Acute on chronic diastolic (congestive) heart failure: Secondary | ICD-10-CM | POA: Diagnosis not present

## 2024-03-05 DIAGNOSIS — E611 Iron deficiency: Secondary | ICD-10-CM | POA: Diagnosis not present

## 2024-03-05 DIAGNOSIS — I2510 Atherosclerotic heart disease of native coronary artery without angina pectoris: Secondary | ICD-10-CM | POA: Diagnosis not present

## 2024-03-05 DIAGNOSIS — M1990 Unspecified osteoarthritis, unspecified site: Secondary | ICD-10-CM | POA: Diagnosis not present

## 2024-03-05 DIAGNOSIS — Z7901 Long term (current) use of anticoagulants: Secondary | ICD-10-CM | POA: Diagnosis not present

## 2024-03-05 DIAGNOSIS — Z6833 Body mass index (BMI) 33.0-33.9, adult: Secondary | ICD-10-CM | POA: Diagnosis not present

## 2024-03-07 DIAGNOSIS — N39498 Other specified urinary incontinence: Secondary | ICD-10-CM | POA: Diagnosis not present

## 2024-03-07 DIAGNOSIS — J9621 Acute and chronic respiratory failure with hypoxia: Secondary | ICD-10-CM | POA: Diagnosis not present

## 2024-03-07 DIAGNOSIS — Z96642 Presence of left artificial hip joint: Secondary | ICD-10-CM | POA: Diagnosis not present

## 2024-03-07 DIAGNOSIS — N401 Enlarged prostate with lower urinary tract symptoms: Secondary | ICD-10-CM | POA: Diagnosis not present

## 2024-03-07 DIAGNOSIS — E785 Hyperlipidemia, unspecified: Secondary | ICD-10-CM | POA: Diagnosis not present

## 2024-03-07 DIAGNOSIS — I5033 Acute on chronic diastolic (congestive) heart failure: Secondary | ICD-10-CM | POA: Diagnosis not present

## 2024-03-07 DIAGNOSIS — I480 Paroxysmal atrial fibrillation: Secondary | ICD-10-CM | POA: Diagnosis not present

## 2024-03-07 DIAGNOSIS — E611 Iron deficiency: Secondary | ICD-10-CM | POA: Diagnosis not present

## 2024-03-07 DIAGNOSIS — J9622 Acute and chronic respiratory failure with hypercapnia: Secondary | ICD-10-CM | POA: Diagnosis not present

## 2024-03-07 DIAGNOSIS — I110 Hypertensive heart disease with heart failure: Secondary | ICD-10-CM | POA: Diagnosis not present

## 2024-03-07 DIAGNOSIS — Z7901 Long term (current) use of anticoagulants: Secondary | ICD-10-CM | POA: Diagnosis not present

## 2024-03-07 DIAGNOSIS — Z6833 Body mass index (BMI) 33.0-33.9, adult: Secondary | ICD-10-CM | POA: Diagnosis not present

## 2024-03-07 DIAGNOSIS — I700 Atherosclerosis of aorta: Secondary | ICD-10-CM | POA: Diagnosis not present

## 2024-03-07 DIAGNOSIS — M1990 Unspecified osteoarthritis, unspecified site: Secondary | ICD-10-CM | POA: Diagnosis not present

## 2024-03-07 DIAGNOSIS — I2510 Atherosclerotic heart disease of native coronary artery without angina pectoris: Secondary | ICD-10-CM | POA: Diagnosis not present

## 2024-03-10 DIAGNOSIS — I110 Hypertensive heart disease with heart failure: Secondary | ICD-10-CM | POA: Diagnosis not present

## 2024-03-10 DIAGNOSIS — E611 Iron deficiency: Secondary | ICD-10-CM | POA: Diagnosis not present

## 2024-03-10 DIAGNOSIS — J9601 Acute respiratory failure with hypoxia: Secondary | ICD-10-CM | POA: Diagnosis not present

## 2024-03-10 DIAGNOSIS — E785 Hyperlipidemia, unspecified: Secondary | ICD-10-CM | POA: Diagnosis not present

## 2024-03-10 DIAGNOSIS — N39498 Other specified urinary incontinence: Secondary | ICD-10-CM | POA: Diagnosis not present

## 2024-03-10 DIAGNOSIS — J9622 Acute and chronic respiratory failure with hypercapnia: Secondary | ICD-10-CM | POA: Diagnosis not present

## 2024-03-10 DIAGNOSIS — I5033 Acute on chronic diastolic (congestive) heart failure: Secondary | ICD-10-CM | POA: Diagnosis not present

## 2024-03-10 DIAGNOSIS — Z6833 Body mass index (BMI) 33.0-33.9, adult: Secondary | ICD-10-CM | POA: Diagnosis not present

## 2024-03-10 DIAGNOSIS — I700 Atherosclerosis of aorta: Secondary | ICD-10-CM | POA: Diagnosis not present

## 2024-03-10 DIAGNOSIS — I2510 Atherosclerotic heart disease of native coronary artery without angina pectoris: Secondary | ICD-10-CM | POA: Diagnosis not present

## 2024-03-10 DIAGNOSIS — I480 Paroxysmal atrial fibrillation: Secondary | ICD-10-CM | POA: Diagnosis not present

## 2024-03-10 DIAGNOSIS — N401 Enlarged prostate with lower urinary tract symptoms: Secondary | ICD-10-CM | POA: Diagnosis not present

## 2024-03-10 DIAGNOSIS — M1990 Unspecified osteoarthritis, unspecified site: Secondary | ICD-10-CM | POA: Diagnosis not present

## 2024-03-10 DIAGNOSIS — J9621 Acute and chronic respiratory failure with hypoxia: Secondary | ICD-10-CM | POA: Diagnosis not present

## 2024-03-10 DIAGNOSIS — Z7901 Long term (current) use of anticoagulants: Secondary | ICD-10-CM | POA: Diagnosis not present

## 2024-03-10 DIAGNOSIS — Z85828 Personal history of other malignant neoplasm of skin: Secondary | ICD-10-CM | POA: Diagnosis not present

## 2024-03-10 DIAGNOSIS — Z96642 Presence of left artificial hip joint: Secondary | ICD-10-CM | POA: Diagnosis not present

## 2024-03-10 DIAGNOSIS — Z96693 Finger-joint replacement, bilateral: Secondary | ICD-10-CM | POA: Diagnosis not present

## 2024-03-10 DIAGNOSIS — Z87440 Personal history of urinary (tract) infections: Secondary | ICD-10-CM | POA: Diagnosis not present

## 2024-03-12 ENCOUNTER — Telehealth: Payer: Self-pay

## 2024-03-12 MED ORDER — METOPROLOL TARTRATE 25 MG PO TABS: ORAL_TABLET | ORAL | Status: DC

## 2024-03-12 NOTE — Addendum Note (Signed)
 Addended by: Joaquim Nam on: 03/12/2024 04:28 PM   Modules accepted: Orders

## 2024-03-12 NOTE — Telephone Encounter (Signed)
 Patient advises that his pulse is 59, no lightheadness.  Patient states that he is not taking the amiodarone stop taking at the end of March per Dr. Herbie Baltimore.  He is just taking metoprolol 1 tablet 2 times a day.

## 2024-03-12 NOTE — Telephone Encounter (Signed)
 I updated his med list.  I would hold metoprolol and update [place] about his BP and pulse in a few days, sooner if needed.  Would drink enough fluid to keep his urine clear or light yellow. Thanks.

## 2024-03-12 NOTE — Telephone Encounter (Signed)
 Patient notified

## 2024-03-12 NOTE — Telephone Encounter (Signed)
 Is he lightheaded on standing?  What is his pulse rate now?  Please verify current amiodarone and metoprolol use.   He may need to hold metoprolol- please let me know about the above.  Thanks.

## 2024-03-12 NOTE — Telephone Encounter (Signed)
 Copied from CRM [id number]. Topic: General - Other >> Mar 12, 2024 10:59 AM Elizebeth Brooking wrote: Reason for CRM: Patient wife stated that patient blood pressure was really high 179 doesn't remember bottom number 90/50 a big difference - would like for Dr. Para March to look at this and respond on what they should do , if patient should come in to be seen   [phone]

## 2024-03-12 NOTE — Telephone Encounter (Signed)
 Called patient to clarify note. He states that when he checked his BP this more it was 90/54 and in the last hour he checked it was 98/57. He wants to know does he need to do anything. Please advise.

## 2024-03-13 DIAGNOSIS — M1990 Unspecified osteoarthritis, unspecified site: Secondary | ICD-10-CM | POA: Diagnosis not present

## 2024-03-13 DIAGNOSIS — I110 Hypertensive heart disease with heart failure: Secondary | ICD-10-CM | POA: Diagnosis not present

## 2024-03-13 DIAGNOSIS — Z7901 Long term (current) use of anticoagulants: Secondary | ICD-10-CM | POA: Diagnosis not present

## 2024-03-13 DIAGNOSIS — Z6833 Body mass index (BMI) 33.0-33.9, adult: Secondary | ICD-10-CM | POA: Diagnosis not present

## 2024-03-13 DIAGNOSIS — Z96642 Presence of left artificial hip joint: Secondary | ICD-10-CM | POA: Diagnosis not present

## 2024-03-13 DIAGNOSIS — E785 Hyperlipidemia, unspecified: Secondary | ICD-10-CM | POA: Diagnosis not present

## 2024-03-13 DIAGNOSIS — I700 Atherosclerosis of aorta: Secondary | ICD-10-CM | POA: Diagnosis not present

## 2024-03-13 DIAGNOSIS — I2510 Atherosclerotic heart disease of native coronary artery without angina pectoris: Secondary | ICD-10-CM | POA: Diagnosis not present

## 2024-03-13 DIAGNOSIS — J9622 Acute and chronic respiratory failure with hypercapnia: Secondary | ICD-10-CM | POA: Diagnosis not present

## 2024-03-13 DIAGNOSIS — N39498 Other specified urinary incontinence: Secondary | ICD-10-CM | POA: Diagnosis not present

## 2024-03-13 DIAGNOSIS — I5033 Acute on chronic diastolic (congestive) heart failure: Secondary | ICD-10-CM | POA: Diagnosis not present

## 2024-03-13 DIAGNOSIS — E611 Iron deficiency: Secondary | ICD-10-CM | POA: Diagnosis not present

## 2024-03-13 DIAGNOSIS — N401 Enlarged prostate with lower urinary tract symptoms: Secondary | ICD-10-CM | POA: Diagnosis not present

## 2024-03-13 DIAGNOSIS — I480 Paroxysmal atrial fibrillation: Secondary | ICD-10-CM | POA: Diagnosis not present

## 2024-03-13 DIAGNOSIS — J9621 Acute and chronic respiratory failure with hypoxia: Secondary | ICD-10-CM | POA: Diagnosis not present

## 2024-03-19 ENCOUNTER — Ambulatory Visit

## 2024-03-19 DIAGNOSIS — Z7901 Long term (current) use of anticoagulants: Secondary | ICD-10-CM | POA: Diagnosis not present

## 2024-03-19 DIAGNOSIS — E611 Iron deficiency: Secondary | ICD-10-CM | POA: Diagnosis not present

## 2024-03-19 DIAGNOSIS — Z6833 Body mass index (BMI) 33.0-33.9, adult: Secondary | ICD-10-CM | POA: Diagnosis not present

## 2024-03-19 DIAGNOSIS — J9621 Acute and chronic respiratory failure with hypoxia: Secondary | ICD-10-CM | POA: Diagnosis not present

## 2024-03-19 DIAGNOSIS — J9622 Acute and chronic respiratory failure with hypercapnia: Secondary | ICD-10-CM | POA: Diagnosis not present

## 2024-03-19 DIAGNOSIS — I5033 Acute on chronic diastolic (congestive) heart failure: Secondary | ICD-10-CM | POA: Diagnosis not present

## 2024-03-19 DIAGNOSIS — I480 Paroxysmal atrial fibrillation: Secondary | ICD-10-CM | POA: Diagnosis not present

## 2024-03-19 DIAGNOSIS — N39498 Other specified urinary incontinence: Secondary | ICD-10-CM | POA: Diagnosis not present

## 2024-03-19 DIAGNOSIS — M1990 Unspecified osteoarthritis, unspecified site: Secondary | ICD-10-CM | POA: Diagnosis not present

## 2024-03-19 DIAGNOSIS — I700 Atherosclerosis of aorta: Secondary | ICD-10-CM | POA: Diagnosis not present

## 2024-03-19 DIAGNOSIS — I2510 Atherosclerotic heart disease of native coronary artery without angina pectoris: Secondary | ICD-10-CM | POA: Diagnosis not present

## 2024-03-19 DIAGNOSIS — E785 Hyperlipidemia, unspecified: Secondary | ICD-10-CM | POA: Diagnosis not present

## 2024-03-19 DIAGNOSIS — I110 Hypertensive heart disease with heart failure: Secondary | ICD-10-CM | POA: Diagnosis not present

## 2024-03-19 DIAGNOSIS — Z96642 Presence of left artificial hip joint: Secondary | ICD-10-CM | POA: Diagnosis not present

## 2024-03-19 DIAGNOSIS — N401 Enlarged prostate with lower urinary tract symptoms: Secondary | ICD-10-CM | POA: Diagnosis not present

## 2024-03-20 DIAGNOSIS — E785 Hyperlipidemia, unspecified: Secondary | ICD-10-CM | POA: Diagnosis not present

## 2024-03-20 DIAGNOSIS — Z6833 Body mass index (BMI) 33.0-33.9, adult: Secondary | ICD-10-CM | POA: Diagnosis not present

## 2024-03-20 DIAGNOSIS — J9621 Acute and chronic respiratory failure with hypoxia: Secondary | ICD-10-CM | POA: Diagnosis not present

## 2024-03-20 DIAGNOSIS — I480 Paroxysmal atrial fibrillation: Secondary | ICD-10-CM | POA: Diagnosis not present

## 2024-03-20 DIAGNOSIS — J9622 Acute and chronic respiratory failure with hypercapnia: Secondary | ICD-10-CM | POA: Diagnosis not present

## 2024-03-20 DIAGNOSIS — I5033 Acute on chronic diastolic (congestive) heart failure: Secondary | ICD-10-CM | POA: Diagnosis not present

## 2024-03-20 DIAGNOSIS — I110 Hypertensive heart disease with heart failure: Secondary | ICD-10-CM | POA: Diagnosis not present

## 2024-03-20 DIAGNOSIS — E611 Iron deficiency: Secondary | ICD-10-CM | POA: Diagnosis not present

## 2024-03-20 DIAGNOSIS — Z96642 Presence of left artificial hip joint: Secondary | ICD-10-CM | POA: Diagnosis not present

## 2024-03-20 DIAGNOSIS — N39498 Other specified urinary incontinence: Secondary | ICD-10-CM | POA: Diagnosis not present

## 2024-03-20 DIAGNOSIS — N401 Enlarged prostate with lower urinary tract symptoms: Secondary | ICD-10-CM | POA: Diagnosis not present

## 2024-03-20 DIAGNOSIS — Z7901 Long term (current) use of anticoagulants: Secondary | ICD-10-CM | POA: Diagnosis not present

## 2024-03-20 DIAGNOSIS — I2510 Atherosclerotic heart disease of native coronary artery without angina pectoris: Secondary | ICD-10-CM | POA: Diagnosis not present

## 2024-03-20 DIAGNOSIS — I700 Atherosclerosis of aorta: Secondary | ICD-10-CM | POA: Diagnosis not present

## 2024-03-20 DIAGNOSIS — M1990 Unspecified osteoarthritis, unspecified site: Secondary | ICD-10-CM | POA: Diagnosis not present

## 2024-03-22 DIAGNOSIS — E611 Iron deficiency: Secondary | ICD-10-CM | POA: Diagnosis not present

## 2024-03-22 DIAGNOSIS — I5033 Acute on chronic diastolic (congestive) heart failure: Secondary | ICD-10-CM | POA: Diagnosis not present

## 2024-03-22 DIAGNOSIS — I2510 Atherosclerotic heart disease of native coronary artery without angina pectoris: Secondary | ICD-10-CM | POA: Diagnosis not present

## 2024-03-22 DIAGNOSIS — N401 Enlarged prostate with lower urinary tract symptoms: Secondary | ICD-10-CM | POA: Diagnosis not present

## 2024-03-22 DIAGNOSIS — E785 Hyperlipidemia, unspecified: Secondary | ICD-10-CM | POA: Diagnosis not present

## 2024-03-22 DIAGNOSIS — I110 Hypertensive heart disease with heart failure: Secondary | ICD-10-CM | POA: Diagnosis not present

## 2024-03-22 DIAGNOSIS — I480 Paroxysmal atrial fibrillation: Secondary | ICD-10-CM | POA: Diagnosis not present

## 2024-03-22 DIAGNOSIS — Z96642 Presence of left artificial hip joint: Secondary | ICD-10-CM | POA: Diagnosis not present

## 2024-03-22 DIAGNOSIS — N39498 Other specified urinary incontinence: Secondary | ICD-10-CM | POA: Diagnosis not present

## 2024-03-22 DIAGNOSIS — J9622 Acute and chronic respiratory failure with hypercapnia: Secondary | ICD-10-CM | POA: Diagnosis not present

## 2024-03-22 DIAGNOSIS — Z7901 Long term (current) use of anticoagulants: Secondary | ICD-10-CM | POA: Diagnosis not present

## 2024-03-22 DIAGNOSIS — J9621 Acute and chronic respiratory failure with hypoxia: Secondary | ICD-10-CM | POA: Diagnosis not present

## 2024-03-22 DIAGNOSIS — I700 Atherosclerosis of aorta: Secondary | ICD-10-CM | POA: Diagnosis not present

## 2024-03-22 DIAGNOSIS — Z6833 Body mass index (BMI) 33.0-33.9, adult: Secondary | ICD-10-CM | POA: Diagnosis not present

## 2024-03-22 DIAGNOSIS — M1990 Unspecified osteoarthritis, unspecified site: Secondary | ICD-10-CM | POA: Diagnosis not present

## 2024-03-25 DIAGNOSIS — I5033 Acute on chronic diastolic (congestive) heart failure: Secondary | ICD-10-CM

## 2024-03-25 DIAGNOSIS — I2510 Atherosclerotic heart disease of native coronary artery without angina pectoris: Secondary | ICD-10-CM

## 2024-03-25 DIAGNOSIS — Z6833 Body mass index (BMI) 33.0-33.9, adult: Secondary | ICD-10-CM

## 2024-03-25 DIAGNOSIS — N39498 Other specified urinary incontinence: Secondary | ICD-10-CM

## 2024-03-25 DIAGNOSIS — J9622 Acute and chronic respiratory failure with hypercapnia: Secondary | ICD-10-CM | POA: Diagnosis not present

## 2024-03-25 DIAGNOSIS — I700 Atherosclerosis of aorta: Secondary | ICD-10-CM

## 2024-03-25 DIAGNOSIS — J9621 Acute and chronic respiratory failure with hypoxia: Secondary | ICD-10-CM | POA: Diagnosis not present

## 2024-03-25 DIAGNOSIS — M1990 Unspecified osteoarthritis, unspecified site: Secondary | ICD-10-CM

## 2024-03-25 DIAGNOSIS — I480 Paroxysmal atrial fibrillation: Secondary | ICD-10-CM | POA: Diagnosis not present

## 2024-03-25 DIAGNOSIS — N401 Enlarged prostate with lower urinary tract symptoms: Secondary | ICD-10-CM

## 2024-03-25 DIAGNOSIS — I110 Hypertensive heart disease with heart failure: Secondary | ICD-10-CM | POA: Diagnosis not present

## 2024-03-28 DIAGNOSIS — I700 Atherosclerosis of aorta: Secondary | ICD-10-CM | POA: Diagnosis not present

## 2024-03-28 DIAGNOSIS — I2510 Atherosclerotic heart disease of native coronary artery without angina pectoris: Secondary | ICD-10-CM | POA: Diagnosis not present

## 2024-03-28 DIAGNOSIS — Z6833 Body mass index (BMI) 33.0-33.9, adult: Secondary | ICD-10-CM | POA: Diagnosis not present

## 2024-03-28 DIAGNOSIS — E611 Iron deficiency: Secondary | ICD-10-CM | POA: Diagnosis not present

## 2024-03-28 DIAGNOSIS — J9621 Acute and chronic respiratory failure with hypoxia: Secondary | ICD-10-CM | POA: Diagnosis not present

## 2024-03-28 DIAGNOSIS — N401 Enlarged prostate with lower urinary tract symptoms: Secondary | ICD-10-CM | POA: Diagnosis not present

## 2024-03-28 DIAGNOSIS — Z7901 Long term (current) use of anticoagulants: Secondary | ICD-10-CM | POA: Diagnosis not present

## 2024-03-28 DIAGNOSIS — I480 Paroxysmal atrial fibrillation: Secondary | ICD-10-CM | POA: Diagnosis not present

## 2024-03-28 DIAGNOSIS — M1990 Unspecified osteoarthritis, unspecified site: Secondary | ICD-10-CM | POA: Diagnosis not present

## 2024-03-28 DIAGNOSIS — I5033 Acute on chronic diastolic (congestive) heart failure: Secondary | ICD-10-CM | POA: Diagnosis not present

## 2024-03-28 DIAGNOSIS — E785 Hyperlipidemia, unspecified: Secondary | ICD-10-CM | POA: Diagnosis not present

## 2024-03-28 DIAGNOSIS — I110 Hypertensive heart disease with heart failure: Secondary | ICD-10-CM | POA: Diagnosis not present

## 2024-03-28 DIAGNOSIS — Z96642 Presence of left artificial hip joint: Secondary | ICD-10-CM | POA: Diagnosis not present

## 2024-03-28 DIAGNOSIS — J9622 Acute and chronic respiratory failure with hypercapnia: Secondary | ICD-10-CM | POA: Diagnosis not present

## 2024-03-28 DIAGNOSIS — N39498 Other specified urinary incontinence: Secondary | ICD-10-CM | POA: Diagnosis not present

## 2024-04-01 ENCOUNTER — Other Ambulatory Visit: Payer: Self-pay | Admitting: Family Medicine

## 2024-04-02 ENCOUNTER — Encounter: Payer: Self-pay | Admitting: Family Medicine

## 2024-04-02 ENCOUNTER — Ambulatory Visit (INDEPENDENT_AMBULATORY_CARE_PROVIDER_SITE_OTHER): Admitting: Family Medicine

## 2024-04-02 ENCOUNTER — Ambulatory Visit (INDEPENDENT_AMBULATORY_CARE_PROVIDER_SITE_OTHER)

## 2024-04-02 VITALS — BP 118/62 | HR 63 | Temp 98.3°F | Ht 70.0 in | Wt 241.8 lb

## 2024-04-02 DIAGNOSIS — G4730 Sleep apnea, unspecified: Secondary | ICD-10-CM | POA: Diagnosis not present

## 2024-04-02 DIAGNOSIS — R5383 Other fatigue: Secondary | ICD-10-CM | POA: Diagnosis not present

## 2024-04-02 DIAGNOSIS — E538 Deficiency of other specified B group vitamins: Secondary | ICD-10-CM

## 2024-04-02 LAB — COMPREHENSIVE METABOLIC PANEL WITH GFR
ALT: 12 U/L (ref 0–53)
AST: 11 U/L (ref 0–37)
Albumin: 3.9 g/dL (ref 3.5–5.2)
Alkaline Phosphatase: 42 U/L (ref 39–117)
BUN: 15 mg/dL (ref 6–23)
CO2: 33 meq/L — ABNORMAL HIGH (ref 19–32)
Calcium: 8.7 mg/dL (ref 8.4–10.5)
Chloride: 104 meq/L (ref 96–112)
Creatinine, Ser: 0.86 mg/dL (ref 0.40–1.50)
GFR: 79.13 mL/min (ref >60.00)
Glucose, Bld: 83 mg/dL (ref 70–99)
Potassium: 4.4 meq/L (ref 3.5–5.1)
Sodium: 142 meq/L (ref 135–145)
Total Bilirubin: 1.5 mg/dL — ABNORMAL HIGH (ref 0.2–1.2)
Total Protein: 6 g/dL (ref 6.0–8.3)

## 2024-04-02 LAB — CBC WITH DIFFERENTIAL/PLATELET
Basophils Absolute: 0 10*3/uL (ref 0.0–0.1)
Basophils Relative: 0.3 % (ref 0.0–3.0)
Eosinophils Absolute: 0 10*3/uL (ref 0.0–0.7)
Eosinophils Relative: 0.6 % (ref 0.0–5.0)
HCT: 43.7 % (ref 39.0–52.0)
Hemoglobin: 14.4 g/dL (ref 13.0–17.0)
Lymphocytes Relative: 12.5 % (ref 12.0–46.0)
Lymphs Abs: 0.9 10*3/uL (ref 0.7–4.0)
MCHC: 33 g/dL (ref 30.0–36.0)
MCV: 99.1 fl (ref 78.0–100.0)
Monocytes Absolute: 0.7 10*3/uL (ref 0.1–1.0)
Monocytes Relative: 9.5 % (ref 3.0–12.0)
Neutro Abs: 5.5 10*3/uL (ref 1.4–7.7)
Neutrophils Relative %: 77.1 % — ABNORMAL HIGH (ref 43.0–77.0)
Platelets: 159 10*3/uL (ref 150.0–400.0)
RBC: 4.41 Mil/uL (ref 4.22–5.81)
RDW: 15.1 % (ref 11.5–15.5)
WBC: 7.1 10*3/uL (ref 4.0–10.5)

## 2024-04-02 LAB — TSH: TSH: 2.42 u[IU]/mL (ref 0.35–5.50)

## 2024-04-02 MED ORDER — CYANOCOBALAMIN 1000 MCG/ML IJ SOLN
1000.0000 ug | Freq: Once | INTRAMUSCULAR | Status: AC
Start: 2024-04-02 — End: 2024-04-02
  Administered 2024-04-02: 1000 ug via INTRAMUSCULAR

## 2024-04-02 NOTE — Progress Notes (Signed)
 Per orders of Dr. Richrd Char, injection of B-12 given by Georg Killian, CMA in left deltoid. Patient tolerated injection well. Patient will make appointment for 1 month.

## 2024-04-02 NOTE — Patient Instructions (Signed)
 I would continue metoprolol , stop amiodarone , and go to the lab on the way out.  You should get a call about seeing pulmonary soon.  Take care.  Glad to see you.

## 2024-04-02 NOTE — Progress Notes (Unsigned)
 The plan per cardiology was to continue Amiodarone  200mg  daily for the month of February. -Reduce Amiodarone  to 100mg  daily for the month of March. -Discontinue Amiodarone  and restart Metoprolol  25mg  BID after March.   Had been taking metoprolol  25mg  BID until today.  Taking lasix  prn.    He didn't feel well for the last few days.  He has been able to fall asleep easily at baseline. He can doze off in the middle of a conversation.  That has been going on for at least a week or so.    No syncope.  Not known to be back in Afib.    He has noisy breathing with sleep but not a typical snore.  It improves with positional neck extension.    The episodes where he is in deep sleep and difficult to arouse have been going on for an extended period but more frequent recently.  These episodes were going on prior to hospitalization.    Meds, vitals, and allergies reviewed.   ROS: Per HPI unless specifically indicated in ROS section   1+ BLE edema.   Driving cautions d/w pt.

## 2024-04-03 DIAGNOSIS — I110 Hypertensive heart disease with heart failure: Secondary | ICD-10-CM | POA: Diagnosis not present

## 2024-04-03 DIAGNOSIS — I480 Paroxysmal atrial fibrillation: Secondary | ICD-10-CM | POA: Diagnosis not present

## 2024-04-03 DIAGNOSIS — M1990 Unspecified osteoarthritis, unspecified site: Secondary | ICD-10-CM | POA: Diagnosis not present

## 2024-04-03 DIAGNOSIS — Z7901 Long term (current) use of anticoagulants: Secondary | ICD-10-CM | POA: Diagnosis not present

## 2024-04-03 DIAGNOSIS — Z6833 Body mass index (BMI) 33.0-33.9, adult: Secondary | ICD-10-CM | POA: Diagnosis not present

## 2024-04-03 DIAGNOSIS — N401 Enlarged prostate with lower urinary tract symptoms: Secondary | ICD-10-CM | POA: Diagnosis not present

## 2024-04-03 DIAGNOSIS — I2510 Atherosclerotic heart disease of native coronary artery without angina pectoris: Secondary | ICD-10-CM | POA: Diagnosis not present

## 2024-04-03 DIAGNOSIS — N39498 Other specified urinary incontinence: Secondary | ICD-10-CM | POA: Diagnosis not present

## 2024-04-03 DIAGNOSIS — J9621 Acute and chronic respiratory failure with hypoxia: Secondary | ICD-10-CM | POA: Diagnosis not present

## 2024-04-03 DIAGNOSIS — J9622 Acute and chronic respiratory failure with hypercapnia: Secondary | ICD-10-CM | POA: Diagnosis not present

## 2024-04-03 DIAGNOSIS — I700 Atherosclerosis of aorta: Secondary | ICD-10-CM | POA: Diagnosis not present

## 2024-04-03 DIAGNOSIS — I5033 Acute on chronic diastolic (congestive) heart failure: Secondary | ICD-10-CM | POA: Diagnosis not present

## 2024-04-03 DIAGNOSIS — Z96642 Presence of left artificial hip joint: Secondary | ICD-10-CM | POA: Diagnosis not present

## 2024-04-03 DIAGNOSIS — E611 Iron deficiency: Secondary | ICD-10-CM | POA: Diagnosis not present

## 2024-04-03 DIAGNOSIS — E785 Hyperlipidemia, unspecified: Secondary | ICD-10-CM | POA: Diagnosis not present

## 2024-04-03 NOTE — Assessment & Plan Note (Signed)
 The concern is for untreated sleep apnea.  He does not want to use CPAP but I do not see any other good option other than having pulmonary evaluation for consideration of CPAP, at least in the short run.  Driving cautions discussed with patient. Discussed that if he has untreated sleep apnea it increases his risk of death/disability.  He agrees to pulmonary referral.  See notes on labs. I would continue metoprolol , stop amiodarone .  Discussed with patient.

## 2024-04-05 DIAGNOSIS — Z96693 Finger-joint replacement, bilateral: Secondary | ICD-10-CM | POA: Diagnosis not present

## 2024-04-05 DIAGNOSIS — J9622 Acute and chronic respiratory failure with hypercapnia: Secondary | ICD-10-CM | POA: Diagnosis not present

## 2024-04-05 DIAGNOSIS — Z85828 Personal history of other malignant neoplasm of skin: Secondary | ICD-10-CM | POA: Diagnosis not present

## 2024-04-05 DIAGNOSIS — J9621 Acute and chronic respiratory failure with hypoxia: Secondary | ICD-10-CM | POA: Diagnosis not present

## 2024-04-05 DIAGNOSIS — I5033 Acute on chronic diastolic (congestive) heart failure: Secondary | ICD-10-CM | POA: Diagnosis not present

## 2024-04-05 DIAGNOSIS — J9601 Acute respiratory failure with hypoxia: Secondary | ICD-10-CM | POA: Diagnosis not present

## 2024-04-05 DIAGNOSIS — E611 Iron deficiency: Secondary | ICD-10-CM | POA: Diagnosis not present

## 2024-04-05 DIAGNOSIS — N39498 Other specified urinary incontinence: Secondary | ICD-10-CM | POA: Diagnosis not present

## 2024-04-05 DIAGNOSIS — I480 Paroxysmal atrial fibrillation: Secondary | ICD-10-CM | POA: Diagnosis not present

## 2024-04-05 DIAGNOSIS — Z87440 Personal history of urinary (tract) infections: Secondary | ICD-10-CM | POA: Diagnosis not present

## 2024-04-05 DIAGNOSIS — I2510 Atherosclerotic heart disease of native coronary artery without angina pectoris: Secondary | ICD-10-CM | POA: Diagnosis not present

## 2024-04-05 DIAGNOSIS — E785 Hyperlipidemia, unspecified: Secondary | ICD-10-CM | POA: Diagnosis not present

## 2024-04-05 DIAGNOSIS — M1990 Unspecified osteoarthritis, unspecified site: Secondary | ICD-10-CM | POA: Diagnosis not present

## 2024-04-05 DIAGNOSIS — N401 Enlarged prostate with lower urinary tract symptoms: Secondary | ICD-10-CM | POA: Diagnosis not present

## 2024-04-05 DIAGNOSIS — Z7901 Long term (current) use of anticoagulants: Secondary | ICD-10-CM | POA: Diagnosis not present

## 2024-04-05 DIAGNOSIS — Z6833 Body mass index (BMI) 33.0-33.9, adult: Secondary | ICD-10-CM | POA: Diagnosis not present

## 2024-04-05 DIAGNOSIS — Z96642 Presence of left artificial hip joint: Secondary | ICD-10-CM | POA: Diagnosis not present

## 2024-04-05 DIAGNOSIS — I700 Atherosclerosis of aorta: Secondary | ICD-10-CM | POA: Diagnosis not present

## 2024-04-05 DIAGNOSIS — I110 Hypertensive heart disease with heart failure: Secondary | ICD-10-CM | POA: Diagnosis not present

## 2024-04-07 ENCOUNTER — Encounter: Payer: Self-pay | Admitting: Family Medicine

## 2024-04-09 DIAGNOSIS — I700 Atherosclerosis of aorta: Secondary | ICD-10-CM | POA: Diagnosis not present

## 2024-04-09 DIAGNOSIS — Z6833 Body mass index (BMI) 33.0-33.9, adult: Secondary | ICD-10-CM | POA: Diagnosis not present

## 2024-04-09 DIAGNOSIS — I5033 Acute on chronic diastolic (congestive) heart failure: Secondary | ICD-10-CM | POA: Diagnosis not present

## 2024-04-09 DIAGNOSIS — J9601 Acute respiratory failure with hypoxia: Secondary | ICD-10-CM | POA: Diagnosis not present

## 2024-04-09 DIAGNOSIS — Z96693 Finger-joint replacement, bilateral: Secondary | ICD-10-CM | POA: Diagnosis not present

## 2024-04-09 DIAGNOSIS — Z7901 Long term (current) use of anticoagulants: Secondary | ICD-10-CM | POA: Diagnosis not present

## 2024-04-09 DIAGNOSIS — Z85828 Personal history of other malignant neoplasm of skin: Secondary | ICD-10-CM | POA: Diagnosis not present

## 2024-04-09 DIAGNOSIS — I110 Hypertensive heart disease with heart failure: Secondary | ICD-10-CM | POA: Diagnosis not present

## 2024-04-09 DIAGNOSIS — E785 Hyperlipidemia, unspecified: Secondary | ICD-10-CM | POA: Diagnosis not present

## 2024-04-09 DIAGNOSIS — I2510 Atherosclerotic heart disease of native coronary artery without angina pectoris: Secondary | ICD-10-CM | POA: Diagnosis not present

## 2024-04-09 DIAGNOSIS — Z96642 Presence of left artificial hip joint: Secondary | ICD-10-CM | POA: Diagnosis not present

## 2024-04-09 DIAGNOSIS — Z87440 Personal history of urinary (tract) infections: Secondary | ICD-10-CM | POA: Diagnosis not present

## 2024-04-09 DIAGNOSIS — E611 Iron deficiency: Secondary | ICD-10-CM | POA: Diagnosis not present

## 2024-04-09 DIAGNOSIS — J9622 Acute and chronic respiratory failure with hypercapnia: Secondary | ICD-10-CM | POA: Diagnosis not present

## 2024-04-09 DIAGNOSIS — N401 Enlarged prostate with lower urinary tract symptoms: Secondary | ICD-10-CM | POA: Diagnosis not present

## 2024-04-09 DIAGNOSIS — I480 Paroxysmal atrial fibrillation: Secondary | ICD-10-CM | POA: Diagnosis not present

## 2024-04-09 DIAGNOSIS — N39498 Other specified urinary incontinence: Secondary | ICD-10-CM | POA: Diagnosis not present

## 2024-04-09 DIAGNOSIS — M1990 Unspecified osteoarthritis, unspecified site: Secondary | ICD-10-CM | POA: Diagnosis not present

## 2024-04-09 DIAGNOSIS — J9621 Acute and chronic respiratory failure with hypoxia: Secondary | ICD-10-CM | POA: Diagnosis not present

## 2024-04-10 DIAGNOSIS — M170 Bilateral primary osteoarthritis of knee: Secondary | ICD-10-CM | POA: Diagnosis not present

## 2024-04-16 ENCOUNTER — Telehealth: Payer: Self-pay | Admitting: *Deleted

## 2024-04-16 DIAGNOSIS — Z7901 Long term (current) use of anticoagulants: Secondary | ICD-10-CM | POA: Diagnosis not present

## 2024-04-16 DIAGNOSIS — Z96693 Finger-joint replacement, bilateral: Secondary | ICD-10-CM | POA: Diagnosis not present

## 2024-04-16 DIAGNOSIS — E785 Hyperlipidemia, unspecified: Secondary | ICD-10-CM | POA: Diagnosis not present

## 2024-04-16 DIAGNOSIS — I110 Hypertensive heart disease with heart failure: Secondary | ICD-10-CM | POA: Diagnosis not present

## 2024-04-16 DIAGNOSIS — M1990 Unspecified osteoarthritis, unspecified site: Secondary | ICD-10-CM | POA: Diagnosis not present

## 2024-04-16 DIAGNOSIS — I480 Paroxysmal atrial fibrillation: Secondary | ICD-10-CM | POA: Diagnosis not present

## 2024-04-16 DIAGNOSIS — Z6833 Body mass index (BMI) 33.0-33.9, adult: Secondary | ICD-10-CM | POA: Diagnosis not present

## 2024-04-16 DIAGNOSIS — Z96642 Presence of left artificial hip joint: Secondary | ICD-10-CM | POA: Diagnosis not present

## 2024-04-16 DIAGNOSIS — J9622 Acute and chronic respiratory failure with hypercapnia: Secondary | ICD-10-CM | POA: Diagnosis not present

## 2024-04-16 DIAGNOSIS — E611 Iron deficiency: Secondary | ICD-10-CM | POA: Diagnosis not present

## 2024-04-16 DIAGNOSIS — I5033 Acute on chronic diastolic (congestive) heart failure: Secondary | ICD-10-CM | POA: Diagnosis not present

## 2024-04-16 DIAGNOSIS — I700 Atherosclerosis of aorta: Secondary | ICD-10-CM | POA: Diagnosis not present

## 2024-04-16 DIAGNOSIS — J9601 Acute respiratory failure with hypoxia: Secondary | ICD-10-CM | POA: Diagnosis not present

## 2024-04-16 DIAGNOSIS — Z87440 Personal history of urinary (tract) infections: Secondary | ICD-10-CM | POA: Diagnosis not present

## 2024-04-16 DIAGNOSIS — J9621 Acute and chronic respiratory failure with hypoxia: Secondary | ICD-10-CM | POA: Diagnosis not present

## 2024-04-16 DIAGNOSIS — N401 Enlarged prostate with lower urinary tract symptoms: Secondary | ICD-10-CM | POA: Diagnosis not present

## 2024-04-16 DIAGNOSIS — N39498 Other specified urinary incontinence: Secondary | ICD-10-CM | POA: Diagnosis not present

## 2024-04-16 DIAGNOSIS — I2510 Atherosclerotic heart disease of native coronary artery without angina pectoris: Secondary | ICD-10-CM | POA: Diagnosis not present

## 2024-04-16 DIAGNOSIS — Z85828 Personal history of other malignant neoplasm of skin: Secondary | ICD-10-CM | POA: Diagnosis not present

## 2024-04-16 NOTE — Telephone Encounter (Signed)
 Pt has medicare, I advised his spouse he has to get shingles vaccine at local pharmacy we can't give it to him here

## 2024-04-16 NOTE — Telephone Encounter (Signed)
 Copied from CRM [id number]. Topic: General - Other >> Apr 16, 2024  2:04 PM Howard Macho wrote: Reason for CRM: patient spouse called stating the patient would like to get a shingles shot at the office. This is the first one at the office  [phone]

## 2024-04-18 ENCOUNTER — Ambulatory Visit: Payer: Medicare Other | Attending: Nurse Practitioner | Admitting: Nurse Practitioner

## 2024-04-18 ENCOUNTER — Encounter: Payer: Self-pay | Admitting: Nurse Practitioner

## 2024-04-18 VITALS — BP 110/62 | HR 73 | Ht 71.0 in | Wt 239.0 lb

## 2024-04-18 DIAGNOSIS — Z96642 Presence of left artificial hip joint: Secondary | ICD-10-CM | POA: Diagnosis not present

## 2024-04-18 DIAGNOSIS — Z96693 Finger-joint replacement, bilateral: Secondary | ICD-10-CM | POA: Diagnosis not present

## 2024-04-18 DIAGNOSIS — E785 Hyperlipidemia, unspecified: Secondary | ICD-10-CM

## 2024-04-18 DIAGNOSIS — I480 Paroxysmal atrial fibrillation: Secondary | ICD-10-CM

## 2024-04-18 DIAGNOSIS — I10 Essential (primary) hypertension: Secondary | ICD-10-CM

## 2024-04-18 DIAGNOSIS — J9601 Acute respiratory failure with hypoxia: Secondary | ICD-10-CM | POA: Diagnosis not present

## 2024-04-18 DIAGNOSIS — I5033 Acute on chronic diastolic (congestive) heart failure: Secondary | ICD-10-CM

## 2024-04-18 DIAGNOSIS — I2510 Atherosclerotic heart disease of native coronary artery without angina pectoris: Secondary | ICD-10-CM

## 2024-04-18 DIAGNOSIS — Z87440 Personal history of urinary (tract) infections: Secondary | ICD-10-CM | POA: Diagnosis not present

## 2024-04-18 DIAGNOSIS — J9622 Acute and chronic respiratory failure with hypercapnia: Secondary | ICD-10-CM | POA: Diagnosis not present

## 2024-04-18 DIAGNOSIS — I110 Hypertensive heart disease with heart failure: Secondary | ICD-10-CM | POA: Diagnosis not present

## 2024-04-18 DIAGNOSIS — I700 Atherosclerosis of aorta: Secondary | ICD-10-CM | POA: Diagnosis not present

## 2024-04-18 DIAGNOSIS — N401 Enlarged prostate with lower urinary tract symptoms: Secondary | ICD-10-CM | POA: Diagnosis not present

## 2024-04-18 DIAGNOSIS — Z85828 Personal history of other malignant neoplasm of skin: Secondary | ICD-10-CM | POA: Diagnosis not present

## 2024-04-18 DIAGNOSIS — Z6833 Body mass index (BMI) 33.0-33.9, adult: Secondary | ICD-10-CM | POA: Diagnosis not present

## 2024-04-18 DIAGNOSIS — E611 Iron deficiency: Secondary | ICD-10-CM | POA: Diagnosis not present

## 2024-04-18 DIAGNOSIS — Z7901 Long term (current) use of anticoagulants: Secondary | ICD-10-CM | POA: Diagnosis not present

## 2024-04-18 DIAGNOSIS — N39498 Other specified urinary incontinence: Secondary | ICD-10-CM | POA: Diagnosis not present

## 2024-04-18 DIAGNOSIS — M1990 Unspecified osteoarthritis, unspecified site: Secondary | ICD-10-CM | POA: Diagnosis not present

## 2024-04-18 DIAGNOSIS — J9621 Acute and chronic respiratory failure with hypoxia: Secondary | ICD-10-CM | POA: Diagnosis not present

## 2024-04-18 NOTE — Progress Notes (Signed)
 Office Visit    Patient Name: Todd Gonzales Date of Encounter: 04/18/2024  Primary Care Provider:  Donnie Galea, MD Primary Cardiologist:  Randene Bustard, MD  Chief Complaint    83 y.o. male with a history of paroxysmal atrial fibrillation, hypertension, hyperlipidemia, and nonobstructive coronary artery disease, who presents for follow-up related to HFpEF.  Past Medical History   Subjective   Past Medical History:  Diagnosis Date   Actinic keratosis 03/28/2023   glabella, needs LN2   Aortic atherosclerosis (HCC)    a. Noted on CT 02/2023.   Arthritis    Basal cell carcinoma 03/28/2023   right paranasal, no tx, cleared with bx   BPH (benign prostatic hyperplasia)    nocturia x4 at baseline   CAD (coronary artery disease)    a. 08/2023 Cor CTA: LM nl, LAD 25-49, LCX <25, RCA 25-49. Ca2+ = 409 (43rd %'ile).   Chronic heart failure with preserved ejection fraction (HFpEF) (HCC)    a. 04/2023 Echo: EF 60-65%, no rwma, mild LVH, nl RV fxn, triv MR; b. 12/2023 TEE: EF 60-65%, nl RV size/fxn, mildly dil LA, no LA/LAA thrombus, mild-mod MR, mild AI.   H/O hiatal hernia    Hemorrhoids    Hyperlipidemia    Hypertension    Dr Kandyce Ort- LOV with clearance 3/13 on chart   IGT (impaired glucose tolerance)    last AIC 5.5- diet controlled- per office note 3/13   Mitral regurgitation    a. 12/2023 TEE: mild to mod MR in setting of afib.   Paroxysmal atrial fibrillation (HCC) 04/14/2023   a. 04/2023 Admitted with A-fib RVR.  Normal echo.  Discharged on amiodarone ; b. 05/2023 Zio: Predominantly sinus rhythm at 66 (50-88).  First-degree AV block.  4 beats of NSVT.  No sustained arrhythmias.  Amiodarone  discontinued; c. 12/2023 recurrent Afib->amio->TEE/DCCV.   SCC (squamous cell carcinoma) 02/10/2005   scc in situ-left forearm.   SCC (squamous cell carcinoma) 02/12/2024   L lateral upper pretibia, EDC   SCCA (squamous cell carcinoma) of skin 02/10/2005   Right Outer Eye (in situ) (curet  and excision)   SCCA (squamous cell carcinoma) of skin 02/13/2006   Right Ear (well diff) (curet and 5FU)   SCCA (squamous cell carcinoma) of skin 04/05/2006   Left Ear Bowl (in situ) (tx p bx)   SCCA (squamous cell carcinoma) of skin 07/06/2010   Right Cheek (in situ) (curet and zyclara)   SCCA (squamous cell carcinoma) of skin 04/27/2009   Left Upper Forearm (in situ) (curet 5FU)   SCCA (squamous cell carcinoma) of skin 08/28/2014   Right Cheek (well diff)   SCCA (squamous cell carcinoma) of skin 03/21/2016   Left Jawline Ant (well diff) (curet and 5FU)   SCCA (squamous cell carcinoma) of skin 01/19/2017   Right Hand (in situ) (tx p bx)   SCCA (squamous cell carcinoma) of skin 03/04/1840   Left Jawline Inf (in situ) (curet and 5FU)   SCCA (squamous cell carcinoma) of skin 12/23/2020   Left Forehead (well diff) (tx p bx)   SCCA (squamous cell carcinoma) of skin 12/23/2020   Left Submandibular Area (in situ) (tx p bx)   Seasonal allergies    Spinal stenosis    Squamous cell carcinoma of skin 08/31/2020   in situ-left jawline cheek   Squamous cell carcinoma of skin 04/27/2021   infil- left nasal sidewall(MOHS)   Squamous cell carcinoma of skin 03/28/2023   Left medial cheek above nasolabial, not  treated, but cleared with bx   Squamous cell carcinoma of skin 08/16/2023   Left lateral upper pretibia, EDC   Squamous cell carcinoma of skin of face 04/27/2021   infil- Left Temporal scalp (MOHS   UTI (lower urinary tract infection)    required admission to [facility]   Past Surgical History:  Procedure Laterality Date   BACK SURGERY  2008   Laminectomy L4-5   CARDIOVERSION N/A 12/22/2023   Procedure: CARDIOVERSION;  Surgeon: Constancia Delton, MD;  Location: [facility] ORS;  Service: Cardiovascular;  Laterality: N/A;   JOINT REPLACEMENT Bilateral    bil   KNEE ARTHROSCOPY     right   LUMBAR LAMINECTOMY/DECOMPRESSION MICRODISCECTOMY Bilateral 11/20/2018   Procedure: Bilateral Thoracic  Twelve to Lumbar One Laminectomy;  Surgeon: Elna Haggis, MD;  Location[facility];  Service: Neurosurgery;  Laterality: Bilateral;  posterior   TEE WITHOUT CARDIOVERSION N/A 12/22/2023   Procedure: TRANSESOPHAGEAL ECHOCARDIOGRAM (TEE);  Surgeon: Constancia Delton, MD;  Location: [facility] ORS;  Service: Cardiovascular;  Laterality: N/A;   TOTAL HIP ARTHROPLASTY  03/20/2012   Procedure: TOTAL HIP ARTHROPLASTY ANTERIOR APPROACH;  Surgeon: Bevin Bucks, MD;  Location: [facility] ORS;  Service: Orthopedics;  Laterality: Left;    Allergies  Allergies  Allergen Reactions   Codeine Other (See Comments)    Unknown reaction   Lipitor [Atorvastatin ] Other (See Comments)    Arthralgias    Bactrim [Sulfamethoxazole-Trimethoprim] Rash       History of Present Illness      83 y.o. y/o male with a history of paroxysmal atrial fibrillation, hypertension, hyperlipidemia, and nonobstructive coronary artery disease.  Patient was hospitalized at [facility] in May 2024 in the setting of dyspnea and fatigue.  He was also found to be in atrial fibrillation/flutter with PVCs and rapid ventricular rate.  He was initially placed on diltiazem  and subsequently amiodarone  with conversion to sinus rhythm.  Echo showed normal LV function.  He was given a dose of IV Lasix .  He was discharged home on Eliquis  and establish care with Dr. Addie Holstein in June 2024.  Amiodarone  was discontinued and he was placed on metoprolol  25 mg twice daily.  Subsequent ZIO monitoring showed predominantly sinus rhythm with a first-degree AV block and an average heart rate of 66.  No sustained arrhythmias or recurrent A-fib noted.   In August 2024, patient required titration of diuretic therapy in the setting of worsening dyspnea and lower extremity swelling.  In the setting of dyspnea on exertion and concern for possible anginal equivalent, he underwent coronary CT angiogram in September 2024 showed a calcium  score of 409 (43rd percentile), and mild to moderate  nonobstructive LAD, RCA, and circumflex disease.  Patient was admitted in January 2025 with dyspnea, hypoxia, acute on chronic HFpEF, and A-fib with RVR.  He was placed on amiodarone  and subsequently underwent TEE and cardioversion.     Mr. Yaffe was last seen in cardiology clinic in February 2025, at which time he was maintaining sinus rhythm.  Amiodarone  was reduced to 100 mg daily with plan to discontinue altogether in April 2025 and use on a as needed basis only.  He has not required any amiodarone  since hospitalization earlier this year.  He is unaware of any recurrence of atrial fibrillation.  He notes chronic, stable dyspnea on exertion and intermittent lower extremity edema.  He uses Lasix  20 mg as needed but rarely uses.  He thinks his dry weight is approximately 240 pounds though he is volume up today at 239  and I note that his weight was 229 when he left the hospital earlier this year.  He denies chest pain, palpitations, PND, orthopnea, dizziness, syncope, or early satiety.  He is accustomed to some amount of ankle swelling. Objective   Home Medications    Current Outpatient Medications  Medication Sig Dispense Refill   acetaminophen  (TYLENOL ) 650 MG CR tablet Take 1,300 mg by mouth 2 (two) times daily as needed for pain.     amiodarone  (PACERONE ) 200 MG tablet Take 200 mg by mouth daily.     amoxicillin  (AMOXIL ) 500 MG capsule Take 1,000 mg by mouth 2 (two) times daily.     ascorbic acid  (VITAMIN C ) 500 MG tablet Take 1 tablet (500 mg total) by mouth daily. 30 tablet 1   Camphor-Menthol -Methyl Sal (SALONPAS EX) Apply 1 patch topically daily as needed (pain).     cyanocobalamin  (VITAMIN B12) 1000 MCG/ML injection Inject 1 mL (1,000 mcg total) into the muscle every 30 (thirty) days.     diclofenac Sodium (VOLTAREN) 1 % GEL Apply 1 Application topically every 6 (six) hours as needed (pain).     ELIQUIS  5 MG TABS tablet TAKE ONE TABLET BY MOUTH TWICE A DAY 60 tablet 2   famotidine   (PEPCID ) 20 MG tablet Take 1 tablet (20 mg total) by mouth 2 (two) times daily.     fluorouracil  (EFUDEX ) 5 % cream Apply topically 2 (two) times daily. Bid 5-7 days to temples, cheeks, forehead and nose 30 g 2   gabapentin  (NEURONTIN ) 300 MG capsule TAKE ONE CAPSULE BY MOUTH ONCE DAILY 90 capsule 1   hydrocortisone  (PROCTOSOL HC) 2.5 % rectal cream Place 1 Application rectally 2 (two) times daily as needed for hemorrhoids or anal itching. Home med.     metoprolol  tartrate (LOPRESSOR ) 25 MG tablet Held as of 03/12/24     mometasone  (ELOCON ) 0.1 % cream Spot treat thicker areas on lower legs once to twice daily until improved. Avoid face, groin, axilla. 45 g 1   ondansetron  (ZOFRAN ) 4 MG tablet TAKE ONE TABLET (4 MG TOTAL) BY MOUTH EVERY EIGHT (EIGHT) HOURS AS NEEDED FOR NAUSEA OR VOMITING. 20 tablet 1   OVER THE COUNTER MEDICATION Place 1-2 sprays into the nose daily as needed (allergies, nasal congestion). Unknown nasal spray.     polyethylene glycol (MIRALAX  / GLYCOLAX ) 17 g packet Take 17 g by mouth daily as needed for mild constipation. 14 each 0   tamsulosin  (FLOMAX ) 0.4 MG CAPS capsule TAKE TWO CAPSULE BY MOUTH DAILY 60 capsule 0   triamcinolone  cream (KENALOG ) 0.1 % Apply 1 Application topically daily. qd to bid aa itchy rash on legs for up to 2 weeks, avoid face, groin, axilla 80 g 1   furosemide  (LASIX ) 20 MG tablet Take 1 tablet (20 mg total) by mouth daily as needed for fluid.     No current facility-administered medications for this visit.     Physical Exam    VS:  BP 110/62 (BP Location: Left Arm, Patient Position: Sitting, Cuff Size: Large)   Pulse 73   Ht 5\' 11"  (1.803 m)   Wt 239 lb (108.4 kg)   SpO2 95%   BMI 33.33 kg/m  , BMI Body mass index is 33.33 kg/m.       GEN: Obese, in no acute distress. HEENT: normal. Neck: Supple, base, difficult to gauge JVP.  No bruits or masses.  Cardiac: RRR, no murmurs, rubs, or gallops. No clubbing, cyanosis, 1+ bilateral lower  extremity edema.  Radials 2+/PT 2+ and equal bilaterally.  Respiratory:  Respirations regular and unlabored, clear to auscultation bilaterally. GI: Obese, semifirm and protuberant.  Nontender.  Bowel sounds present x 4.  MS: no deformity or atrophy. Skin: warm and dry, no rash. Neuro:  Strength and sensation are intact. Psych: Normal affect.  Accessory Clinical Findings    ECG personally reviewed by me today - EKG Interpretation Date/Time:  Thursday Apr 18 2024 13:54:23 EDT Ventricular Rate:  73 PR Interval:  174 QRS Duration:  82 QT Interval:  380 QTC Calculation: 418 R Axis:   7  Text Interpretation: Normal sinus rhythm Cannot rule out Anterior infarct , age undetermined Confirmed by Laneta Pintos [phone] on 04/18/2024 2:00:11 PM  - no acute changes.  Lab Results  Component Value Date   WBC 7.1 04/02/2024   HGB 14.4 04/02/2024   HCT 43.7 04/02/2024   MCV 99.1 04/02/2024   PLT 159.0 04/02/2024   Lab Results  Component Value Date   CREATININE 0.86 04/02/2024   BUN 15 04/02/2024   NA 142 04/02/2024   K 4.4 04/02/2024   CL 104 04/02/2024   CO2 33 (H) 04/02/2024   Lab Results  Component Value Date   ALT 12 04/02/2024   AST 11 04/02/2024   ALKPHOS 42 04/02/2024   BILITOT 1.5 (H) 04/02/2024   Lab Results  Component Value Date   CHOL 109 04/16/2023   HDL 41 04/16/2023   LDLCALC 58 04/16/2023   TRIG 51 04/16/2023   CHOLHDL 2.7 04/16/2023    Lab Results  Component Value Date   HGBA1C 5.3 12/26/2022   Lab Results  Component Value Date   TSH 2.42 04/02/2024       Assessment & Plan    1.  Acute on chronic heart failure with preserved ejection fraction: TEE in January 2025 with a EF of 60 to 65%..  He was diuresed down to 229 pounds at that time.  He says he tries to keep his weight around 240 pounds although at 239 today, he has 1+ bilateral lower extremity edema and increased abdominal girth.  We discussed his use of as needed Lasix , which is currently  seldom.  I encouraged him to use Lasix  for weight above 230 pounds and advised that if he requires daily Lasix  dosing, we will need to know as he will need a follow-up basic metabolic panel.  His lab work was stable in April.  Heart rate and blood pressure stable.   2.  Paroxysmal atrial fibrillation: Status post TEE and cardioversion earlier this year.  He was on amiodarone  through the month of March and is now off of it.  He does have it available for as needed usage as previously outlined by Dr. Addie Holstein.  He has not required any as needed amiodarone .  He remains anticoagulated on Eliquis  with stable H&H and renal function in April.  3.  Primary hypertension: Stable on beta-blocker therapy.  4.  Hyperlipidemia: LDL of 58 last May.  He is statin intolerant.  5.  Nonobstructive CAD: Nonobstructive disease by coronary CT angiogram last year.  No statin in the setting of statin intolerance and LDL of 58 last year with recommendation for aggressive lifestyle modifications.  No aspirin  in the setting of Eliquis .  6.  Disposition: Follow-up in 3 months or sooner if necessary.  Laneta Pintos, NP 04/18/2024, 6:06 PM

## 2024-04-18 NOTE — Patient Instructions (Signed)
 Medication Instructions:  Your Physician recommend you continue on your current medication as directed.    *If you need a refill on your cardiac medications before your next appointment, please call your pharmacy*  Follow-Up: At [facility], you and your health needs are our priority.  As part of our continuing mission to provide you with exceptional heart care, our providers are all part of one team.  This team includes your primary Cardiologist (physician) and Advanced Practice Providers or APPs (Physician Assistants and Nurse Practitioners) who all work together to provide you with the care you need, when you need it.  Your next appointment:   3 month(s)  Provider:   Randene Bustard, MD or Laneta Pintos, NP    We recommend signing up for the patient portal called "MyChart".  Sign up information is provided on this After Visit Summary.  MyChart is used to connect with patients for Virtual Visits (Telemedicine).  Patients are able to view lab/test results, encounter notes, upcoming appointments, etc.  Non-urgent messages can be sent to your provider as well.   To learn more about what you can do with MyChart, go to [email or web address].

## 2024-04-24 ENCOUNTER — Encounter: Payer: Self-pay | Admitting: Internal Medicine

## 2024-04-24 ENCOUNTER — Ambulatory Visit (INDEPENDENT_AMBULATORY_CARE_PROVIDER_SITE_OTHER): Admitting: Internal Medicine

## 2024-04-24 VITALS — BP 100/70 | HR 59 | Temp 98.0°F | Ht 70.0 in | Wt 240.8 lb

## 2024-04-24 DIAGNOSIS — I4891 Unspecified atrial fibrillation: Secondary | ICD-10-CM

## 2024-04-24 DIAGNOSIS — R5381 Other malaise: Secondary | ICD-10-CM | POA: Diagnosis not present

## 2024-04-24 DIAGNOSIS — R400 Somnolence: Secondary | ICD-10-CM

## 2024-04-24 DIAGNOSIS — G4733 Obstructive sleep apnea (adult) (pediatric): Secondary | ICD-10-CM

## 2024-04-24 DIAGNOSIS — E669 Obesity, unspecified: Secondary | ICD-10-CM | POA: Diagnosis not present

## 2024-04-24 DIAGNOSIS — Z6834 Body mass index (BMI) 34.0-34.9, adult: Secondary | ICD-10-CM

## 2024-04-24 NOTE — Patient Instructions (Signed)
 Obtain home sleep study to assess for sleep apnea  Avoid Allergens and Irritants Avoid secondhand smoke Avoid SICK contacts Recommend  Masking  when appropriate Recommend Keep up-to-date with vaccinations  Follow-up with cardiology as scheduled

## 2024-04-24 NOTE — Progress Notes (Signed)
 Name: Todd Gonzales MRN: [id number] DOB: 1941/04/04    CHIEF COMPLAINT:  EXCESSIVE DAYTIME SLEEPINESS   HISTORY OF PRESENT ILLNESS: Patient is seen today for problems and issues with sleep related to excessive daytime sleepiness Patient  has been having sleep problems for many years Patient has been having excessive daytime sleepiness for a long time Patient has been having extreme fatigue and tiredness, lack of energy +  very Loud snoring every night + struggling breathe at night and gasps for air + morning headaches + Nonrefreshing sleep  Discussed sleep data and reviewed with patient.  Encouraged proper weight management.  Discussed driving precautions and its relationship with hypersomnolence.  Discussed operating dangerous equipment and its relationship with hypersomnolence.  Discussed sleep hygiene, and benefits of a fixed sleep waked time.  The importance of getting eight or more hours of sleep discussed with patient.  Discussed limiting the use of the computer and television before bedtime.  Decrease naps during the day, so night time sleep will become enhanced.  Limit caffeine, and sleep deprivation.  HTN, stroke, and heart failure are potential risk factors.       04/24/2024   10:00 AM  Results of the Epworth flowsheet  Sitting and reading 3  Watching TV 3  Sitting, inactive in a public place (e.g. a theatre or a meeting) 3  As a passenger in a car for an hour without a break 3  Lying down to rest in the afternoon when circumstances permit 2  Sitting and talking to someone 3  Sitting quietly after a lunch without alcohol 3  In a car, while stopped for a few minutes in traffic 1  Total score 21   No exacerbation at this time No evidence of heart failure at this time No evidence or signs of infection at this time No respiratory distress No fevers, chills, nausea, vomiting, diarrhea No evidence of lower extremity edema No evidence hemoptysis    Non-smoker Nonalcoholic Retired Neurosurgeon company   Cardiac history Atrial fibrillation Previously on amiodarone  now on beta-blockers  On anticoagulation with Eliquis  Lasix  therapy   PAST MEDICAL HISTORY :   has a past medical history of Actinic keratosis (03/28/2023), Aortic atherosclerosis (HCC), Arthritis, Basal cell carcinoma (03/28/2023), BPH (benign prostatic hyperplasia), CAD (coronary artery disease), Chronic heart failure with preserved ejection fraction (HFpEF) (HCC), H/O hiatal hernia, Hemorrhoids, Hyperlipidemia, Hypertension, IGT (impaired glucose tolerance), Mitral regurgitation, Paroxysmal atrial fibrillation (HCC) (04/14/2023), SCC (squamous cell carcinoma) (02/10/2005), SCC (squamous cell carcinoma) (02/12/2024), SCCA (squamous cell carcinoma) of skin (02/10/2005), SCCA (squamous cell carcinoma) of skin (02/13/2006), SCCA (squamous cell carcinoma) of skin (04/05/2006), SCCA (squamous cell carcinoma) of skin (07/06/2010), SCCA (squamous cell carcinoma) of skin (04/27/2009), SCCA (squamous cell carcinoma) of skin (08/28/2014), SCCA (squamous cell carcinoma) of skin (03/21/2016), SCCA (squamous cell carcinoma) of skin (01/19/2017), SCCA (squamous cell carcinoma) of skin (03/04/1840), SCCA (squamous cell carcinoma) of skin (12/23/2020), SCCA (squamous cell carcinoma) of skin (12/23/2020), Seasonal allergies, Spinal stenosis, Squamous cell carcinoma of skin (08/31/2020), Squamous cell carcinoma of skin (04/27/2021), Squamous cell carcinoma of skin (03/28/2023), Squamous cell carcinoma of skin (08/16/2023), Squamous cell carcinoma of skin of face (04/27/2021), and UTI (lower urinary tract infection).  has a past surgical history that includes Back surgery (2008); Knee arthroscopy; Total hip arthroplasty (03/20/2012); Joint replacement (Bilateral); Lumbar laminectomy/decompression microdiscectomy (Bilateral, 11/20/2018); TEE without cardioversion (N/A, 12/22/2023); and  Cardioversion (N/A, 12/22/2023). Prior to Admission medications   Medication Sig Start Date End Date Taking? Authorizing Provider  acetaminophen  (TYLENOL ) 650 MG CR tablet Take 1,300 mg by mouth 2 (two) times daily as needed for pain.    [provider]  amiodarone  (PACERONE ) 200 MG tablet Take 200 mg by mouth daily.    [provider]  amoxicillin  (AMOXIL ) 500 MG capsule Take 1,000 mg by mouth 2 (two) times daily. 03/27/24   [provider]  ascorbic acid  (VITAMIN C ) 500 MG tablet Take 1 tablet (500 mg total) by mouth daily. 12/31/23   Amin, Sumayya, MD  Camphor-Menthol -Methyl Sal (SALONPAS EX) Apply 1 patch topically daily as needed (pain).    [provider]  cyanocobalamin  (VITAMIN B12) 1000 MCG/ML injection Inject 1 mL (1,000 mcg total) into the muscle every 30 (thirty) days. 01/26/23   Donnie Galea, MD  diclofenac Sodium (VOLTAREN) 1 % GEL Apply 1 Application topically every 6 (six) hours as needed (pain). 09/05/16   [provider]  ELIQUIS  5 MG TABS tablet TAKE ONE TABLET BY MOUTH TWICE A DAY 02/09/24   Donnie Galea, MD  famotidine  (PEPCID ) 20 MG tablet Take 1 tablet (20 mg total) by mouth 2 (two) times daily. 10/03/23   Elois Hair, MD  fluorouracil  (EFUDEX ) 5 % cream Apply topically 2 (two) times daily. Bid 5-7 days to temples, cheeks, forehead and nose 02/12/24   Artemio Larry, MD  furosemide  (LASIX ) 20 MG tablet Take 1 tablet (20 mg total) by mouth daily as needed for fluid. 10/31/23 01/29/24  Donnie Galea, MD  gabapentin  (NEURONTIN ) 300 MG capsule TAKE ONE CAPSULE BY MOUTH ONCE DAILY 10/25/23   Donnie Galea, MD  hydrocortisone  (PROCTOSOL HC) 2.5 % rectal cream Place 1 Application rectally 2 (two) times daily as needed for hemorrhoids or anal itching. Home med. 01/08/24   Garrison Kanner, MD  metoprolol  tartrate (LOPRESSOR ) 25 MG tablet Held as of 03/12/24 03/12/24   Donnie Galea, MD  mometasone  (ELOCON ) 0.1 % cream Spot treat thicker  areas on lower legs once to twice daily until improved. Avoid face, groin, axilla. 05/03/23   Artemio Larry, MD  ondansetron  (ZOFRAN ) 4 MG tablet TAKE ONE TABLET (4 MG TOTAL) BY MOUTH EVERY EIGHT (EIGHT) HOURS AS NEEDED FOR NAUSEA OR VOMITING. 11/01/23   Donnie Galea, MD  OVER THE COUNTER MEDICATION Place 1-2 sprays into the nose daily as needed (allergies, nasal congestion). Unknown nasal spray.    [provider]  polyethylene glycol (MIRALAX  / GLYCOLAX ) 17 g packet Take 17 g by mouth daily as needed for mild constipation. 04/16/23   Krishnan, Gokul, MD  tamsulosin  (FLOMAX ) 0.4 MG CAPS capsule TAKE TWO CAPSULE BY MOUTH DAILY 04/02/24   Donnie Galea, MD  triamcinolone  cream (KENALOG ) 0.1 % Apply 1 Application topically daily. qd to bid aa itchy rash on legs for up to 2 weeks, avoid face, groin, axilla 02/12/24   Artemio Larry, MD   Allergies  Allergen Reactions   Codeine Other (See Comments)    Unknown reaction   Lipitor [Atorvastatin ] Other (See Comments)    Arthralgias    Bactrim [Sulfamethoxazole-Trimethoprim] Rash    FAMILY HISTORY:  family history includes Arthritis in his mother; Asthma in his father; Lung cancer in his father. SOCIAL HISTORY:  reports that he has never smoked. He has never used smokeless tobacco. He reports current alcohol use. He reports that he does not use drugs.   Review of Systems:  Gen:  Denies  fever, sweats, chills weight loss  HEENT: Denies blurred vision, double vision, ear pain,  eye pain, hearing loss, nose bleeds, sore throat Cardiac:  No dizziness, chest pain or heaviness, chest tightness,edema, No JVD Resp:   No cough, -sputum production, -shortness of breath,-wheezing, -hemoptysis,  Gi: Denies swallowing difficulty, stomach pain, nausea or vomiting, diarrhea, constipation, bowel incontinence Gu:  Denies bladder incontinence, burning urine Ext:   Denies Joint pain, stiffness or swelling Skin: Denies  skin rash, easy bruising or  bleeding or hives Endoc:  Denies polyuria, polydipsia , polyphagia or weight change Psych:   Denies depression, insomnia or hallucinations  Other:  All other systems negative   ALL OTHER ROS ARE NEGATIVE   BP 100/70 (BP Location: Left Arm, Patient Position: Sitting, Cuff Size: Large)   Pulse (!) 59   Temp 98 F (36.7 C) (Oral)   Ht 5\' 10"  (1.778 m)   Wt 240 lb 12.8 oz (109.2 kg)   SpO2 94%   BMI 34.55 kg/m     Physical Examination:   General Appearance: No distress  EYES PERRLA, EOM intact.   NECK Supple, No JVD ORAL CAVITY MALLAMPATI 4 Pulmonary: normal breath sounds, No wheezing.  CardiovascularNormal S1,S2.  No m/r/g.   Abdomen: Benign, Soft, non-tender. Skin:   warm, no rashes, no ecchymosis  Extremities: normal, no cyanosis, clubbing. Neuro:without focal findings,  speech normal  PSYCHIATRIC: Mood, affect within normal limits.   ALL OTHER ROS ARE NEGATIVE    ASSESSMENT AND PLAN SYNOPSIS  Patient with signs and symptoms of excessive daytime sleepiness with probable underlying diagnosis of obstructive sleep apnea in the setting of obesity and deconditioned state   Recommend HOME Sleep Study for definitve diagnosis  Obesity -recommend significant weight loss -recommend changing diet  Deconditioned state -Recommend increased daily activity and exercise   Atrial Fibrillation - Sleep apnea can contribute to Atrial Fibrillation, therefore treatment of sleep apnea is important part of A fib management.   MEDICATION ADJUSTMENTS/LABS AND TESTS ORDERED: Recommend Sleep Study Recommend weight loss   CURRENT MEDICATIONS REVIEWED AT LENGTH WITH PATIENT TODAY   Patient  satisfied with Plan of action and management. All questions answered  Follow up  3 months   I spent a total of  68 minutes reviewing chart data, face-to-face evaluation with the patient, counseling and coordination of care as detailed above.    Lady Pier, M.D.  [employer]  Pulmonary & Critical Care Medicine  Medical Director [facility] Medical Director [facility] Cardio-Pulmonary Department

## 2024-05-01 ENCOUNTER — Other Ambulatory Visit: Payer: Self-pay | Admitting: Family Medicine

## 2024-05-02 ENCOUNTER — Telehealth: Payer: Self-pay | Admitting: Internal Medicine

## 2024-05-02 NOTE — Telephone Encounter (Signed)
 Patient needs 3 month follow up visit.

## 2024-05-06 DIAGNOSIS — N401 Enlarged prostate with lower urinary tract symptoms: Secondary | ICD-10-CM | POA: Diagnosis not present

## 2024-05-06 DIAGNOSIS — E611 Iron deficiency: Secondary | ICD-10-CM | POA: Diagnosis not present

## 2024-05-06 DIAGNOSIS — J9622 Acute and chronic respiratory failure with hypercapnia: Secondary | ICD-10-CM | POA: Diagnosis not present

## 2024-05-06 DIAGNOSIS — M1990 Unspecified osteoarthritis, unspecified site: Secondary | ICD-10-CM | POA: Diagnosis not present

## 2024-05-06 DIAGNOSIS — Z7901 Long term (current) use of anticoagulants: Secondary | ICD-10-CM | POA: Diagnosis not present

## 2024-05-06 DIAGNOSIS — I2510 Atherosclerotic heart disease of native coronary artery without angina pectoris: Secondary | ICD-10-CM | POA: Diagnosis not present

## 2024-05-06 DIAGNOSIS — Z96642 Presence of left artificial hip joint: Secondary | ICD-10-CM | POA: Diagnosis not present

## 2024-05-06 DIAGNOSIS — Z87440 Personal history of urinary (tract) infections: Secondary | ICD-10-CM | POA: Diagnosis not present

## 2024-05-06 DIAGNOSIS — I700 Atherosclerosis of aorta: Secondary | ICD-10-CM | POA: Diagnosis not present

## 2024-05-06 DIAGNOSIS — J9601 Acute respiratory failure with hypoxia: Secondary | ICD-10-CM | POA: Diagnosis not present

## 2024-05-06 DIAGNOSIS — I110 Hypertensive heart disease with heart failure: Secondary | ICD-10-CM | POA: Diagnosis not present

## 2024-05-06 DIAGNOSIS — Z96693 Finger-joint replacement, bilateral: Secondary | ICD-10-CM | POA: Diagnosis not present

## 2024-05-06 DIAGNOSIS — J9621 Acute and chronic respiratory failure with hypoxia: Secondary | ICD-10-CM | POA: Diagnosis not present

## 2024-05-06 DIAGNOSIS — Z85828 Personal history of other malignant neoplasm of skin: Secondary | ICD-10-CM | POA: Diagnosis not present

## 2024-05-06 DIAGNOSIS — E785 Hyperlipidemia, unspecified: Secondary | ICD-10-CM | POA: Diagnosis not present

## 2024-05-06 DIAGNOSIS — I480 Paroxysmal atrial fibrillation: Secondary | ICD-10-CM | POA: Diagnosis not present

## 2024-05-06 DIAGNOSIS — I5033 Acute on chronic diastolic (congestive) heart failure: Secondary | ICD-10-CM | POA: Diagnosis not present

## 2024-05-06 DIAGNOSIS — Z6833 Body mass index (BMI) 33.0-33.9, adult: Secondary | ICD-10-CM | POA: Diagnosis not present

## 2024-05-06 DIAGNOSIS — N39498 Other specified urinary incontinence: Secondary | ICD-10-CM | POA: Diagnosis not present

## 2024-05-07 ENCOUNTER — Telehealth: Payer: Self-pay

## 2024-05-07 DIAGNOSIS — G4733 Obstructive sleep apnea (adult) (pediatric): Secondary | ICD-10-CM

## 2024-05-07 NOTE — Telephone Encounter (Signed)
 Copied from CRM [id number]. Topic: General - Other >> May 07, 2024  9:55 AM Eveleen Hinds B wrote: Reason for CRM: Patient saw Dr Auston Left. It was discussed that he would take a 3 day home sleep study. Patient has not received the home study. He received a call a week ago to schedule an appt and told the caller he has not even received the home sleep study yet. Caller promised a call back and no one has called. Please return call to: [phone].

## 2024-05-17 ENCOUNTER — Encounter

## 2024-05-17 DIAGNOSIS — G4733 Obstructive sleep apnea (adult) (pediatric): Secondary | ICD-10-CM

## 2024-05-17 DIAGNOSIS — G4730 Sleep apnea, unspecified: Secondary | ICD-10-CM | POA: Diagnosis not present

## 2024-05-28 ENCOUNTER — Encounter: Payer: Self-pay | Admitting: Dermatology

## 2024-05-28 ENCOUNTER — Ambulatory Visit: Admitting: Dermatology

## 2024-05-28 DIAGNOSIS — L570 Actinic keratosis: Secondary | ICD-10-CM | POA: Diagnosis not present

## 2024-05-28 DIAGNOSIS — Z79899 Other long term (current) drug therapy: Secondary | ICD-10-CM

## 2024-05-28 DIAGNOSIS — Z85828 Personal history of other malignant neoplasm of skin: Secondary | ICD-10-CM

## 2024-05-28 DIAGNOSIS — L578 Other skin changes due to chronic exposure to nonionizing radiation: Secondary | ICD-10-CM

## 2024-05-28 DIAGNOSIS — W908XXA Exposure to other nonionizing radiation, initial encounter: Secondary | ICD-10-CM | POA: Diagnosis not present

## 2024-05-28 DIAGNOSIS — D0439 Carcinoma in situ of skin of other parts of face: Secondary | ICD-10-CM

## 2024-05-28 DIAGNOSIS — Z5111 Encounter for antineoplastic chemotherapy: Secondary | ICD-10-CM | POA: Diagnosis not present

## 2024-05-28 DIAGNOSIS — Z7189 Other specified counseling: Secondary | ICD-10-CM

## 2024-05-28 DIAGNOSIS — D492 Neoplasm of unspecified behavior of bone, soft tissue, and skin: Secondary | ICD-10-CM

## 2024-05-28 NOTE — Progress Notes (Signed)
 Follow-Up Visit   Subjective  Todd Gonzales is a 83 y.o. male who presents for the following: Actinic keratosis.  Did not use 5FU/Calcipotriene  the full time as directed. Used once daily for 3-4 days. He states it made his hair fall out.   3 month follow up. SCC, KA-type. Bx/EDC 02/12/2024. Left upper pretibia.  Concerned about several spots on face. R cheek, R temporal hair line, L cheek, L temple. C/O places popping up on his skin. Scratches off and drainage looks like pus.  The patient has spots, moles and lesions to be evaluated, some may be new or changing and the patient may have concern these could be cancer.     The following portions of the chart were reviewed this encounter and updated as appropriate: medications, allergies, medical history  Review of Systems:  No other skin or systemic complaints except as noted in HPI or Assessment and Plan.  Objective  Well appearing patient in no apparent distress; mood and affect are within normal limits.  A focused examination was performed of the following areas: Face, legs  Relevant exam findings are noted in the Assessment and Plan.  L Lower Leg x6, nose x2, R ant ankle x1, L hand x1, L forearm x3, R hand x6, R thenar wrist at palm x1,R forearm x5, R temple x1, R lat forehead x1 (27) Hyperkeratotic erythematous papules Left Jaw x2 (2) Erythematous keratotic papule Left Upper Temple 1.6 cm pink keratotic scaly plaque   Assessment & Plan   HISTORY OF SQUAMOUS CELL CARCINOMA OF THE SKIN, KA type. - No evidence of recurrence today - Recommend regular full body skin exams - Recommend daily broad spectrum sunscreen SPF 30+ to sun-exposed areas, reapply every 2 hours as needed.  - Call if any new or changing lesions are noted between office visits  ACTINIC DAMAGE WITH PRECANCEROUS ACTINIC KERATOSES Counseling for Topical Chemotherapy Management: Patient exhibits: - Severe, confluent actinic changes with pre-cancerous actinic  keratoses that is secondary to cumulative UV radiation exposure over time - Condition that is severe; chronic, not at goal. - diffuse scaly erythematous macules and papules with underlying dyspigmentation - Discussed Prescription Field Treatment topical Chemotherapy for Severe, Chronic Confluent Actinic Changes with Pre-Cancerous Actinic Keratoses Field treatment involves treatment of an entire area of skin that has confluent Actinic Changes (Sun/ Ultraviolet light damage) and PreCancerous Actinic Keratoses by method of PhotoDynamic Therapy (PDT) and/or prescription Topical Chemotherapy agents such as 5-fluorouracil , 5-fluorouracil /calcipotriene , and/or imiquimod.  The purpose is to decrease the number of clinically evident and subclinical PreCancerous lesions to prevent progression to development of skin cancer by chemically destroying early precancer changes that may or may not be visible.  It has been shown to reduce the risk of developing skin cancer in the treated area. As a result of treatment, redness, scaling, crusting, and open sores may occur during treatment course. One or more than one of these methods may be used and may have to be used several times to control, suppress and eliminate the PreCancerous changes. Discussed treatment course, expected reaction, and possible side effects. - Recommend daily broad spectrum sunscreen SPF 30+ to sun-exposed areas, reapply every 2 hours as needed.  - Staying in the shade or wearing long sleeves, sun glasses (UVA+UVB protection) and wide brim hats (4-inch brim around the entire circumference of the hat) are also recommended. - Call for new or changing lesions. Treat sections of face at a time (left cheek/left temple then right cheek/right temple, then nose,  etc.) twice daily for at least 4 days- Start 5-fluorouracil /calcipotriene  cream twice a day for 5-7 days to affected areas including temples, cheeks, nose and forehead. Prescription sent to [facility]. Patient advised they will receive an email to purchase the medication online and have it sent to their home. Patient provided with handout reviewing treatment course and side effects and advised to call or message us  on MyChart with any concerns.   Reviewed course of treatment and expected reaction.  Patient advised to expect inflammation and crusting and advised that erosions are possible.  Patient advised to be diligent with sun protection during and after treatment. Counseled to keep medication out of reach of children and pets.   HYPERTROPHIC ACTINIC KERATOSIS (27) L Lower Leg x6, nose x2, R ant ankle x1, L hand x1, L forearm x3, R hand x6, R thenar wrist at palm x1,R forearm x5, R temple x1, R lat forehead x1 (27) Actinic keratoses are precancerous spots that appear secondary to cumulative UV radiation exposure/sun exposure over time. They are chronic with expected duration over 1 year. A portion of actinic keratoses will progress to squamous cell carcinoma of the skin. It is not possible to reliably predict which spots will progress to skin cancer and so treatment is recommended to prevent development of skin cancer.  Recommend daily broad spectrum sunscreen SPF 30+ to sun-exposed areas, reapply every 2 hours as needed.  Recommend staying in the shade or wearing long sleeves, sun glasses (UVA+UVB protection) and wide brim hats (4-inch brim around the entire circumference of the hat). Call for new or changing lesions. Destruction of lesion - L Lower Leg x6, nose x2, R ant ankle x1, L hand x1, L forearm x3, R hand x6, R thenar wrist at palm x1,R forearm x5, R temple x1, R lat forehead x1 (27)  Destruction method: cryotherapy   Informed consent: discussed and consent obtained   Lesion destroyed using liquid nitrogen: Yes   Region frozen until ice ball extended beyond lesion: Yes   Outcome: patient tolerated procedure well with no complications   Post-procedure  details: wound care instructions given   Additional details:  Prior to procedure, discussed risks of blister formation, small wound, skin dyspigmentation, or rare scar following cryotherapy. Recommend Vaseline ointment to treated areas while healing.  AK (ACTINIC KERATOSIS) (2) Left Jaw x2 (2) AK vs ISK  Recheck at follow up.  Actinic keratoses are precancerous spots that appear secondary to cumulative UV radiation exposure/sun exposure over time. They are chronic with expected duration over 1 year. A portion of actinic keratoses will progress to squamous cell carcinoma of the skin. It is not possible to reliably predict which spots will progress to skin cancer and so treatment is recommended to prevent development of skin cancer.  Recommend daily broad spectrum sunscreen SPF 30+ to sun-exposed areas, reapply every 2 hours as needed.  Recommend staying in the shade or wearing long sleeves, sun glasses (UVA+UVB protection) and wide brim hats (4-inch brim around the entire circumference of the hat). Call for new or changing lesions. Destruction of lesion - Left Jaw x2 (2)  Destruction method: cryotherapy   Informed consent: discussed and consent obtained   Lesion destroyed using liquid nitrogen: Yes   Region frozen until ice ball extended beyond lesion: Yes   Outcome: patient tolerated procedure well with no complications   Post-procedure details: wound care instructions given   Additional details:  Prior to procedure, discussed risks of blister formation, small wound,  skin dyspigmentation, or rare scar following cryotherapy. Recommend Vaseline ointment to treated areas while healing.  NEOPLASM OF SKIN Left Upper Temple Skin / nail biopsy Type of biopsy: tangential   Informed consent: discussed and consent obtained   Anesthesia: the lesion was anesthetized in a standard fashion   Anesthesia comment:  Area prepped with alcohol Anesthetic:  1% lidocaine  w/ epinephrine  1-100,000 buffered w/  8.4% NaHCO3 Instrument used: flexible razor blade   Hemostasis achieved with: pressure, aluminum chloride and electrodesiccation   Outcome: patient tolerated procedure well   Post-procedure details: wound care instructions given   Post-procedure details comment:  Ointment and small bandage applied Specimen 1 - Surgical pathology Differential Diagnosis: ISK, R/O SCC/BCC  Check Margins: No     Return for AK Follow Up, Biopsy Follow Up, Sun Exposed Exam in 2-3 months.  I, Jill Parcell, CMA, am acting as scribe for Rexene Rattler, MD.   Documentation: I have reviewed the above documentation for accuracy and completeness, and I agree with the above.  Rexene Rattler, MD

## 2024-05-28 NOTE — Patient Instructions (Addendum)
 In One Week: Treat sections of face at a time (left cheek/left temple then right cheek/right temple, then nose, etc.) twice daily for at least 4 days- Start 5-fluorouracil /calcipotriene  cream twice a day for 5-7 days to affected areas including temples, cheeks, nose and forehead. Prescription sent to [facility]. Patient advised they will receive an email to purchase the medication online and have it sent to their home. Patient provided with handout reviewing treatment course and side effects and advised to call or message us  on MyChart with any concerns.   Reviewed course of treatment and expected reaction.  Patient advised to expect inflammation and crusting and advised that erosions are possible.  Patient advised to be diligent with sun protection during and after treatment. Counseled to keep medication out of reach of children and pets.     Cryotherapy Aftercare  Wash gently with soap and water everyday.   Apply Vaseline Jelly daily until healed.     Wound Care Instructions  Cleanse wound gently with soap and water once a day then pat dry with clean gauze. Apply a thin coat of Petrolatum (petroleum jelly, Vaseline) over the wound (unless you have an allergy to this). We recommend that you use a new, sterile tube of Vaseline. Do not pick or remove scabs. Do not remove the yellow or white healing tissue from the base of the wound.  Cover the wound with fresh, clean, nonstick gauze and secure with paper tape. You may use Band-Aids in place of gauze and tape if the wound is small enough, but would recommend trimming much of the tape off as there is often too much. Sometimes Band-Aids can irritate the skin.  You should call the office for your biopsy report after 1 week if you have not already been contacted.  If you experience any problems, such as abnormal amounts of bleeding, swelling, significant bruising, significant pain, or evidence of infection, please call  the office immediately.  FOR ADULT SURGERY PATIENTS: If you need something for pain relief you may take 1 extra strength Tylenol  (acetaminophen ) AND 2 Ibuprofen (200mg  each) together every 4 hours as needed for pain. (do not take these if you are allergic to them or if you have a reason you should not take them.) Typically, you may only need pain medication for 1 to 3 days.       Recommend daily broad spectrum sunscreen SPF 30+ to sun-exposed areas, reapply every 2 hours as needed. Call for new or changing lesions.  Staying in the shade or wearing long sleeves, sun glasses (UVA+UVB protection) and wide brim hats (4-inch brim around the entire circumference of the hat) are also recommended for sun protection.     Due to recent changes in healthcare laws, you may see results of your pathology and/or laboratory studies on MyChart before the doctors have had a chance to review them. We understand that in some cases there may be results that are confusing or concerning to you. Please understand that not all results are received at the same time and often the doctors may need to interpret multiple results in order to provide you with the best plan of care or course of treatment. Therefore, we ask that you please give us  2 business days to thoroughly review all your results before contacting the office for clarification. Should we see a critical lab result, you will be contacted sooner.   If You Need Anything After Your Visit  If you have any questions or concerns  for your doctor, please call our main line at [phone] and press option 4 to reach your doctor's medical assistant. If no one answers, please leave a voicemail as directed and we will return your call as soon as possible. Messages left after 4 pm will be answered the following business day.   You may also send us  a message via MyChart. We typically respond to MyChart messages within 1-2 business days.  For prescription refills, please  ask your pharmacy to contact our office. Our fax number is [phone].  If you have an urgent issue when the clinic is closed that cannot wait until the next business day, you can page your doctor at the number below.    Please note that while we do our best to be available for urgent issues outside of office hours, we are not available 24/7.   If you have an urgent issue and are unable to reach us , you may choose to seek medical care at your doctor's office, retail clinic, urgent care center, or emergency room.  If you have a medical emergency, please immediately call 911 or go to the emergency department.  Pager Numbers  - Dr. Hester: [phone]  - Dr. Jackquline: [phone]  - Dr. Claudene: [phone]   In the event of inclement weather, please call our main line at [phone] for an update on the status of any delays or closures.  Dermatology Medication Tips: Please keep the boxes that topical medications come in in order to help keep track of the instructions about where and how to use these. Pharmacies typically print the medication instructions only on the boxes and not directly on the medication tubes.   If your medication is too expensive, please contact our office at [phone] option 4 or send us  a message through MyChart.   We are unable to tell what your co-pay for medications will be in advance as this is different depending on your insurance coverage. However, we may be able to find a substitute medication at lower cost or fill out paperwork to get insurance to cover a needed medication.   If a prior authorization is required to get your medication covered by your insurance company, please allow us  1-2 business days to complete this process.  Drug prices often vary depending on where the prescription is filled and some pharmacies may offer cheaper prices.  The website www.goodrx.com contains coupons for medications through different pharmacies. The prices here do  not account for what the cost may be with help from insurance (it may be cheaper with your insurance), but the website can give you the price if you did not use any insurance.  - You can print the associated coupon and take it with your prescription to the pharmacy.  - You may also stop by our office during regular business hours and pick up a GoodRx coupon card.  - If you need your prescription sent electronically to a different pharmacy, notify our office through [facility] or by phone at [phone] option 4.     Si Usted Necesita Algo Despus de Su Visita  Tambin puede enviarnos un mensaje a travs de Clinical cytogeneticist. Por lo general respondemos a los mensajes de MyChart en el transcurso de 1 a 2 das hbiles.  Para renovar recetas, por favor pida a su farmacia que se ponga en contacto con nuestra oficina. [place]i [place] de fax es [place] [phone].  Si tiene un asunto urgente cuando la clnica est cerrada y que no puede esperar  hasta el siguiente da hbil, puede llamar/localizar a su doctor(a) al nmero que aparece a continuacin.   Por favor, tenga en cuenta que aunque hacemos todo lo posible para estar disponibles para asuntos urgentes fuera del horario de [place], no estamos disponibles las 24 horas del da, los 7 [address]  [address] de la [place].   Si tiene un problema urgente y no puede comunicarse con nosotros, puede optar por buscar atencin mdica  en el consultorio de su doctor(a), en una clnica privada, en un centro de atencin urgente o en una sala de emergencias.  Si tiene Engineer, drilling, por favor llame inmediatamente al 911 o vaya a la sala de emergencias.  Nmeros de bper  - Dr. Hester: [phone]  - Dra. Jackquline: [phone]  - Dr. Claudene: [phone]   En caso de inclemencias del tiempo, por favor llame a landry capes principal al [phone] para una actualizacin sobre el [place] de cualquier retraso o cierre.  Consejos para la medicacin en  dermatologa: Por favor, guarde las cajas en las que vienen los medicamentos de uso tpico para ayudarle a seguir las instrucciones sobre dnde y cmo usarlos. Las farmacias generalmente imprimen las instrucciones del medicamento slo en las cajas y no directamente en los tubos del [place].   Si su medicamento es muy caro, por favor, pngase en contacto con landry rieger llamando al [phone] y presione la opcin 4 o envenos un mensaje a travs de Clinical cytogeneticist.   No podemos decirle cul ser su copago por los medicamentos por adelantado ya que esto es diferente dependiendo de la cobertura de su seguro. Sin embargo, es posible que podamos encontrar un medicamento sustituto a Audiological scientist un formulario para que el seguro cubra el medicamento que se considera necesario.   Si se requiere una autorizacin previa para que su compaa de seguros malta su medicamento, por favor permtanos de 1 a 2 das hbiles para completar este proceso.  Los precios de los medicamentos varan con frecuencia dependiendo del Environmental consultant de dnde se surte la receta y alguna farmacias pueden ofrecer precios ms baratos.  El sitio web www.goodrx.com tiene cupones para medicamentos de Health and safety inspector. Los precios aqu no tienen en cuenta lo que podra costar con la ayuda del seguro (puede ser ms barato con su seguro), pero el sitio web puede darle el precio si no utiliz Tourist information centre manager.  - Puede imprimir el cupn correspondiente y llevarlo con su receta a la farmacia.  - Tambin puede pasar por nuestra oficina durante el horario de atencin regular y Education officer, museum una tarjeta de cupones de GoodRx.  - Si necesita que su receta se enve electrnicamente a una farmacia diferente, informe a nuestra oficina a travs de MyChart de [place] o por telfono llamando al [phone] y presione la opcin 4.

## 2024-05-30 LAB — SURGICAL PATHOLOGY

## 2024-05-31 ENCOUNTER — Other Ambulatory Visit: Payer: Self-pay | Admitting: Cardiology

## 2024-06-03 ENCOUNTER — Other Ambulatory Visit: Payer: Self-pay | Admitting: Family Medicine

## 2024-06-03 ENCOUNTER — Encounter: Payer: Self-pay | Admitting: Dermatology

## 2024-06-03 ENCOUNTER — Ambulatory Visit: Payer: Self-pay | Admitting: Dermatology

## 2024-06-03 NOTE — Telephone Encounter (Signed)
 Advised patient biopsy of the left upper temple was SCC in situ arising in an SK. Appt scheduled for [facility] 06/10/24 at 2:30 PM.

## 2024-06-03 NOTE — Telephone Encounter (Signed)
-----   Message from Rexene Rattler sent at 06/03/2024  8:57 AM EDT ----- 1. Skin, left upper temple :       SQUAMOUS CELL CARCINOMA IN SITU ARISING IN A SEBORRHEIC KERATOSIS   SCCIS with SK- needs EDC - please call patient ----- Message ----- From: Interface, Lab In Three Zero One Sent: 05/30/2024   4:47 PM EDT To: Rexene Rattler, MD

## 2024-06-03 NOTE — Telephone Encounter (Signed)
 Left pt msg to call for bx results/sh

## 2024-06-04 DIAGNOSIS — G4733 Obstructive sleep apnea (adult) (pediatric): Secondary | ICD-10-CM | POA: Diagnosis not present

## 2024-06-06 ENCOUNTER — Encounter: Payer: Self-pay | Admitting: Internal Medicine

## 2024-06-06 ENCOUNTER — Telehealth: Admitting: Internal Medicine

## 2024-06-06 DIAGNOSIS — G4733 Obstructive sleep apnea (adult) (pediatric): Secondary | ICD-10-CM

## 2024-06-06 DIAGNOSIS — I4891 Unspecified atrial fibrillation: Secondary | ICD-10-CM | POA: Diagnosis not present

## 2024-06-06 NOTE — Patient Instructions (Addendum)
 Start AUTO CPAP 4-16 cm h20 Nasal mask Air fit P30i mask  Avoid Allergens and Irritants Avoid secondhand smoke Avoid SICK contacts Recommend  Masking  when appropriate Recommend Keep up-to-date with vaccinations

## 2024-06-06 NOTE — Progress Notes (Signed)
 Name: Todd Gonzales MRN: [id number] DOB: 1941-04-17        Virtual Visit via EPIC VIDEO   I connected with patient on EPIC VIDEO CHAT and verified that I am speaking with the correct person using two identifiers.   I discussed the limitations, risks, security and privacy concerns of performing an evaluation and management service by telephone and the availability of in person appointments. I also discussed with the patient that there may be a patient responsible charge related to this service. The patient expressed understanding and agreed to proceed.   Location of the patient: Home Location of provider: office Participating persons: Patient and provider only     CHIEF COMPLAINT:  Follow-up assessment for sleep apnea HST May 17, 2024 severe sleep apnea AHI of 80 with significant hypoxia   HISTORY OF PRESENT ILLNESS: Discussed sleep data and reviewed with patient.  Patient with severe sleep apnea I have relayed to patient that the best option at this time to do a CPAP in-lab titration study however patient refuses to do in-lab  Plan to start auto CPAP 4-16 Nasal mask AirFit P30 I  No exacerbation at this time No evidence of heart failure at this time No evidence or signs of infection at this time No respiratory distress No fevers, chills, nausea, vomiting, diarrhea No evidence of lower extremity edema No evidence hemoptysis  HTN, stroke, and heart failure are potential risk factors.    Non-smoker Nonalcoholic Retired Neurosurgeon company   Cardiac history Atrial fibrillation Previously on amiodarone  now on beta-blockers  On anticoagulation with Eliquis  Lasix  therapy   PAST MEDICAL HISTORY :   has a past medical history of Actinic keratosis (03/28/2023), Aortic atherosclerosis (HCC), Arthritis, Basal cell carcinoma (03/28/2023), BPH (benign prostatic hyperplasia), CAD (coronary artery disease), Chronic heart failure with preserved ejection fraction  (HFpEF) (HCC), H/O hiatal hernia, Hemorrhoids, Hyperlipidemia, Hypertension, IGT (impaired glucose tolerance), Mitral regurgitation, Paroxysmal atrial fibrillation (HCC) (04/14/2023), SCC (squamous cell carcinoma) (02/10/2005), SCC (squamous cell carcinoma) (02/12/2024), SCCA (squamous cell carcinoma) of skin (02/10/2005), SCCA (squamous cell carcinoma) of skin (02/13/2006), SCCA (squamous cell carcinoma) of skin (04/05/2006), SCCA (squamous cell carcinoma) of skin (07/06/2010), SCCA (squamous cell carcinoma) of skin (04/27/2009), SCCA (squamous cell carcinoma) of skin (08/28/2014), SCCA (squamous cell carcinoma) of skin (03/21/2016), SCCA (squamous cell carcinoma) of skin (01/19/2017), SCCA (squamous cell carcinoma) of skin (03/04/1840), SCCA (squamous cell carcinoma) of skin (12/23/2020), SCCA (squamous cell carcinoma) of skin (12/23/2020), Seasonal allergies, Spinal stenosis, Squamous cell carcinoma of skin (08/31/2020), Squamous cell carcinoma of skin (04/27/2021), Squamous cell carcinoma of skin (03/28/2023), Squamous cell carcinoma of skin (08/16/2023), Squamous cell carcinoma of skin (05/28/2024), Squamous cell carcinoma of skin of face (04/27/2021), and UTI (lower urinary tract infection).  has a past surgical history that includes Back surgery (2008); Knee arthroscopy; Total hip arthroplasty (03/20/2012); Joint replacement (Bilateral); Lumbar laminectomy/decompression microdiscectomy (Bilateral, 11/20/2018); TEE without cardioversion (N/A, 12/22/2023); and Cardioversion (N/A, 12/22/2023). Prior to Admission medications   Medication Sig Start Date End Date Taking? Authorizing Provider  acetaminophen  (TYLENOL ) 650 MG CR tablet Take 1,300 mg by mouth 2 (two) times daily as needed for pain.    [provider]  amiodarone  (PACERONE ) 200 MG tablet Take 200 mg by mouth daily.    [provider]  amoxicillin  (AMOXIL ) 500 MG capsule Take 1,000 mg by mouth 2 (two) times daily. 03/27/24   [provider]  ascorbic acid  (VITAMIN C ) 500 MG tablet Take 1 tablet (500 mg total) by  mouth daily. 12/31/23   Amin, Sumayya, MD  Camphor-Menthol -Methyl Sal (SALONPAS EX) Apply 1 patch topically daily as needed (pain).    [provider]  cyanocobalamin  (VITAMIN B12) 1000 MCG/ML injection Inject 1 mL (1,000 mcg total) into the muscle every 30 (thirty) days. 01/26/23   Cleatus Arlyss RAMAN, MD  diclofenac Sodium (VOLTAREN) 1 % GEL Apply 1 Application topically every 6 (six) hours as needed (pain). 09/05/16   [provider]  ELIQUIS  5 MG TABS tablet TAKE ONE TABLET BY MOUTH TWICE A DAY 02/09/24   Cleatus Arlyss RAMAN, MD  famotidine  (PEPCID ) 20 MG tablet Take 1 tablet (20 mg total) by mouth 2 (two) times daily. 10/03/23   Stacia Glendia BRAVO, MD  fluorouracil  (EFUDEX ) 5 % cream Apply topically 2 (two) times daily. Bid 5-7 days to temples, cheeks, forehead and nose 02/12/24   Jackquline Sawyer, MD  furosemide  (LASIX ) 20 MG tablet Take 1 tablet (20 mg total) by mouth daily as needed for fluid. 10/31/23 01/29/24  Cleatus Arlyss RAMAN, MD  gabapentin  (NEURONTIN ) 300 MG capsule TAKE ONE CAPSULE BY MOUTH ONCE DAILY 10/25/23   Cleatus Arlyss RAMAN, MD  hydrocortisone  (PROCTOSOL HC) 2.5 % rectal cream Place 1 Application rectally 2 (two) times daily as needed for hemorrhoids or anal itching. Home med. 01/08/24   Awanda [place], MD  metoprolol  tartrate (LOPRESSOR ) 25 MG tablet Held as of 03/12/24 03/12/24   Cleatus Arlyss RAMAN, MD  mometasone  (ELOCON ) 0.1 % cream Spot treat thicker areas on lower legs once to twice daily until improved. Avoid face, groin, axilla. 05/03/23   Jackquline Sawyer, MD  ondansetron  (ZOFRAN ) 4 MG tablet TAKE ONE TABLET (4 MG TOTAL) BY MOUTH EVERY EIGHT (EIGHT) HOURS AS NEEDED FOR NAUSEA OR VOMITING. 11/01/23   Cleatus Arlyss RAMAN, MD  OVER THE COUNTER MEDICATION Place 1-2 sprays into the nose daily as needed (allergies, nasal congestion). Unknown nasal spray.    [provider]  polyethylene glycol  (MIRALAX  / GLYCOLAX ) 17 g packet Take 17 g by mouth daily as needed for mild constipation. 04/16/23   Krishnan, Gokul, MD  tamsulosin  (FLOMAX ) 0.4 MG CAPS capsule TAKE TWO CAPSULE BY MOUTH DAILY 04/02/24   Cleatus Arlyss RAMAN, MD  triamcinolone  cream (KENALOG ) 0.1 % Apply 1 Application topically daily. qd to bid aa itchy rash on legs for up to 2 weeks, avoid face, groin, axilla 02/12/24   Jackquline Sawyer, MD   Allergies  Allergen Reactions   Codeine Other (See Comments)    Unknown reaction   Lipitor [Atorvastatin ] Other (See Comments)    Arthralgias    Bactrim [Sulfamethoxazole-Trimethoprim] Rash    FAMILY HISTORY:  family history includes Arthritis in his mother; Asthma in his father; Lung cancer in his father. SOCIAL HISTORY:  reports that he has never smoked. He has never used smokeless tobacco. He reports current alcohol use. He reports that he does not use drugs.    ASSESSMENT AND PLAN SYNOPSIS  83 year old pleasant white male seen today for follow-up assessment for severe sleep apnea with AHI of 80 with significant hypoxia  I swelling and recommend in-lab CPAP titration study however patient refuses at this time  Will start auto CPAP 4-16 Nasal mask AirFit P30i Obesity -recommend significant weight loss -recommend changing diet  Deconditioned state -Recommend increased daily activity and exercise   Atrial Fibrillation - Sleep apnea can contribute to Atrial Fibrillation, therefore treatment of sleep apnea is important part of A fib management.   MEDICATION ADJUSTMENTS/LABS AND TESTS ORDERED: Start AUTO  CPAP 4-16 cm h20 Nasal mask Air fit P30i mask Avoid Allergens and Irritants Avoid secondhand smoke Avoid SICK contacts Recommend  Masking  when appropriate Recommend Keep up-to-date with vaccinations   CURRENT MEDICATIONS REVIEWED AT LENGTH WITH PATIENT TODAY   Patient  satisfied with Plan of action and management. All questions answered   Follow up 3 months   I  spent a total of 41 minutes dedicated to the care of this patient on the date of this encounter to include pre-visit review of records, face-to-face time with the patient discussing conditions above, post visit ordering of testing, clinical documentation with the electronic health record, making appropriate referrals as documented, and communicating necessary information to the patient's healthcare team.    The Patient requires high complexity decision making for assessment and support, frequent evaluation and titration of therapies, application of advanced monitoring technologies and extensive interpretation of multiple databases.  Patient satisfied with Plan of action and management. All questions answered    Nickolas Alm Cellar, M.D.  [facility]  Medical Director [facility] Medical Director [facility] Cardio-Pulmonary Department

## 2024-06-06 NOTE — Addendum Note (Signed)
 Addended by: Werner Labella J on: 06/06/2024 02:40 PM   Modules accepted: Orders

## 2024-06-10 ENCOUNTER — Ambulatory Visit: Admitting: Dermatology

## 2024-06-10 ENCOUNTER — Encounter: Payer: Self-pay | Admitting: Dermatology

## 2024-06-10 DIAGNOSIS — L820 Inflamed seborrheic keratosis: Secondary | ICD-10-CM | POA: Diagnosis not present

## 2024-06-10 DIAGNOSIS — D0439 Carcinoma in situ of skin of other parts of face: Secondary | ICD-10-CM

## 2024-06-10 DIAGNOSIS — W908XXA Exposure to other nonionizing radiation, initial encounter: Secondary | ICD-10-CM

## 2024-06-10 DIAGNOSIS — L578 Other skin changes due to chronic exposure to nonionizing radiation: Secondary | ICD-10-CM | POA: Diagnosis not present

## 2024-06-10 DIAGNOSIS — D099 Carcinoma in situ, unspecified: Secondary | ICD-10-CM

## 2024-06-10 DIAGNOSIS — I872 Venous insufficiency (chronic) (peripheral): Secondary | ICD-10-CM | POA: Diagnosis not present

## 2024-06-10 MED ORDER — TRIAMCINOLONE ACETONIDE 0.1 % EX CREA: TOPICAL_CREAM | CUTANEOUS | 1 refills | Status: AC

## 2024-06-10 NOTE — Patient Instructions (Addendum)
 Cryotherapy Aftercare  Wash gently with soap and water everyday.   Apply Vaseline and Band-Aid daily until healed.   For lower legs: Start TMC 0.1% cream, apply 1-2 times daily to affected areas, lower legs for up to 2 weeks. If not resolving, may have to send in something different. Avoid applying to face, groin, and axilla. Use as directed. Long-term use can cause thinning of the skin.  Topical steroids (such as triamcinolone , fluocinolone, fluocinonide, mometasone , clobetasol , halobetasol, betamethasone , hydrocortisone ) can cause thinning and lightening of the skin if they are used for too long in the same area. Your physician has selected the right strength medicine for your problem and area affected on the body. Please use your medication only as directed by your physician to prevent side effects.    Electrodesiccation and Curettage ("Scrape and Burn") Wound Care Instructions  Leave the original bandage on for 24 hours if possible.  If the bandage becomes soaked or soiled before that time, it is OK to remove it and examine the wound.  A small amount of post-operative bleeding is normal.  If excessive bleeding occurs, remove the bandage, place gauze over the site and apply continuous pressure (no peeking) over the area for 30 minutes. If this does not work, please call our clinic as soon as possible or page your doctor if it is after hours.   Once a day, cleanse the wound with soap and water. It is fine to shower. If a thick crust develops you may use a Q-tip dipped into dilute hydrogen peroxide (mix 1:1 with water) to dissolve it.  Hydrogen peroxide can slow the healing process, so use it only as needed.    After washing, apply petroleum jelly (Vaseline) or an antibiotic ointment if your doctor prescribed one for you, followed by a bandage.    For best healing, the wound should be covered with a layer of ointment at all times. If you are not able to keep the area covered with a bandage to  hold the ointment in place, this may mean re-applying the ointment several times a day.  Continue this wound care until the wound has healed and is no longer open. It may take several weeks for the wound to heal and close.  Itching and mild discomfort is normal during the healing process.  If you have any discomfort, you can take Tylenol  (acetaminophen ) or ibuprofen as directed on the bottle. (Please do not take these if you have an allergy to them or cannot take them for another reason).  Some redness, tenderness and white or yellow material in the wound is normal healing.  If the area becomes very sore and red, or develops a thick yellow-green material (pus), it may be infected; please notify us .    Wound healing continues for up to one year following surgery. It is not unusual to experience pain in the scar from time to time during the interval.  If the pain becomes severe or the scar thickens, you should notify the office.    A slight amount of redness in a scar is expected for the first six months.  After six months, the redness will fade and the scar will soften and fade.  The color difference becomes less noticeable with time.  If there are any problems, return for a post-op surgery check at your earliest convenience.  To improve the appearance of the scar, you can use silicone scar gel, cream, or sheets (such as Mederma or Serica) every night for  up to one year. These are available over the counter (without a prescription).  Please call our office at [phone] for any questions or concerns.    Due to recent changes in healthcare laws, you may see results of your pathology and/or laboratory studies on MyChart before the doctors have had a chance to review them. We understand that in some cases there may be results that are confusing or concerning to you. Please understand that not all results are received at the same time and often the doctors may need to interpret multiple results in  order to provide you with the best plan of care or course of treatment. Therefore, we ask that you please give us  2 business days to thoroughly review all your results before contacting the office for clarification. Should we see a critical lab result, you will be contacted sooner.   If You Need Anything After Your Visit  If you have any questions or concerns for your doctor, please call our main line at [phone] and press option 4 to reach your doctor's medical assistant. If no one answers, please leave a voicemail as directed and we will return your call as soon as possible. Messages left after 4 pm will be answered the following business day.   You may also send us  a message via MyChart. We typically respond to MyChart messages within 1-2 business days.  For prescription refills, please ask your pharmacy to contact our office. Our fax number is [phone].  If you have an urgent issue when the clinic is closed that cannot wait until the next business day, you can page your doctor at the number below.    Please note that while we do our best to be available for urgent issues outside of office hours, we are not available 24/7.   If you have an urgent issue and are unable to reach us , you may choose to seek medical care at your doctor's office, retail clinic, urgent care center, or emergency room.  If you have a medical emergency, please immediately call 911 or go to the emergency department.  Pager Numbers  - Dr. Hester: [phone]  - Dr. Jackquline: [phone]  - Dr. Claudene: [phone]   In the event of inclement weather, please call our main line at [phone] for an update on the status of any delays or closures.  Dermatology Medication Tips: Please keep the boxes that topical medications come in in order to help keep track of the instructions about where and how to use these. Pharmacies typically print the medication instructions only on the boxes and not directly on  the medication tubes.   If your medication is too expensive, please contact our office at [phone] option 4 or send us  a message through MyChart.   We are unable to tell what your co-pay for medications will be in advance as this is different depending on your insurance coverage. However, we may be able to find a substitute medication at lower cost or fill out paperwork to get insurance to cover a needed medication.   If a prior authorization is required to get your medication covered by your insurance company, please allow us  1-2 business days to complete this process.  Drug prices often vary depending on where the prescription is filled and some pharmacies may offer cheaper prices.  The website www.goodrx.com contains coupons for medications through different pharmacies. The prices here do not account for what the cost may be with help from insurance (it may be cheaper  with your insurance), but the website can give you the price if you did not use any insurance.  - You can print the associated coupon and take it with your prescription to the pharmacy.  - You may also stop by our office during regular business hours and pick up a GoodRx coupon card.  - If you need your prescription sent electronically to a different pharmacy, notify our office through [facility] or by phone at [phone] option 4.     Si Usted Necesita Algo Despus de Su Visita  Tambin puede enviarnos un mensaje a travs de Clinical cytogeneticist. Por lo general respondemos a los mensajes de MyChart en el transcurso de 1 a 2 das hbiles.  Para renovar recetas, por favor pida a su farmacia que se ponga en contacto con nuestra oficina. Randi lakes de fax es [place] [phone].  Si tiene un asunto urgente cuando la clnica est cerrada y que no puede esperar hasta el siguiente da hbil, puede llamar/localizar a su doctor(a) al nmero que aparece a continuacin.   Por favor, tenga en cuenta que aunque hacemos todo lo  posible para estar disponibles para asuntos urgentes fuera del horario de [place], no estamos disponibles las 24 horas del da, los 7 [address]  [address] de la [place].   Si tiene un problema urgente y no puede comunicarse con nosotros, puede optar por buscar atencin mdica  en el consultorio de su doctor(a), en una clnica privada, en un centro de atencin urgente o en una sala de emergencias.  Si tiene Engineer, drilling, por favor llame inmediatamente al 911 o vaya a la sala de emergencias.  Nmeros de bper  - Dr. Hester: [phone]  - Dra. Jackquline: [phone]  - Dr. Claudene: [phone]   En caso de inclemencias del tiempo, por favor llame a landry capes principal al [phone] para una actualizacin sobre el [place] de cualquier retraso o cierre.  Consejos para la medicacin en dermatologa: Por favor, guarde las cajas en las que vienen los medicamentos de uso tpico para ayudarle a seguir las instrucciones sobre dnde y cmo usarlos. Las farmacias generalmente imprimen las instrucciones del medicamento slo en las cajas y no directamente en los tubos del [place].   Si su medicamento es muy caro, por favor, pngase en contacto con landry rieger llamando al [phone] y presione la opcin 4 o envenos un mensaje a travs de Clinical cytogeneticist.   No podemos decirle cul ser su copago por los medicamentos por adelantado ya que esto es diferente dependiendo de la cobertura de su seguro. Sin embargo, es posible que podamos encontrar un medicamento sustituto a Audiological scientist un formulario para que el seguro cubra el medicamento que se considera necesario.   Si se requiere una autorizacin previa para que su compaa de seguros malta su medicamento, por favor permtanos de 1 a 2 das hbiles para completar este proceso.  Los precios de los medicamentos varan con frecuencia dependiendo del Environmental consultant de dnde se surte la receta y alguna farmacias pueden ofrecer precios ms baratos.  El sitio web  www.goodrx.com tiene cupones para medicamentos de Health and safety inspector. Los precios aqu no tienen en cuenta lo que podra costar con la ayuda del seguro (puede ser ms barato con su seguro), pero el sitio web puede darle el precio si no utiliz Tourist information centre manager.  - Puede imprimir el cupn correspondiente y llevarlo con su receta a la farmacia.  - Tambin puede pasar por nuestra oficina durante el horario de atencin regular y Education officer, museum  una tarjeta de cupones de GoodRx.  - Si necesita que su receta se enve electrnicamente a una farmacia diferente, informe a nuestra oficina a travs de MyChart de [place] o por telfono llamando al [phone] y presione la opcin 4.

## 2024-06-10 NOTE — Progress Notes (Signed)
 Follow-Up Visit   Subjective  Todd Gonzales is a 83 y.o. male who presents for the following: Patient here to treat biopsy proven SQUAMOUS CELL CARCINOMA IN SITU ARISING IN A SEBORRHEIC KERATOSIS at left upper temple with EDC. Patient does have some places at bilateral lower legs that are itchy.   The patient has spots, moles and lesions to be evaluated, some may be new or changing and the patient may have concern these could be cancer.   The following portions of the chart were reviewed this encounter and updated as appropriate: medications, allergies, medical history  Review of Systems:  No other skin or systemic complaints except as noted in HPI or Assessment and Plan.  Objective  Well appearing patient in no apparent distress; mood and affect are within normal limits.  A focused examination was performed of the following areas: L upper temple, Bilateral lower legs  Relevant exam findings are noted in the Assessment and Plan.  Left upper temple Healing biopsy site R pretibia x1, L calf x1 (2) Stuck on waxy paps with erythema  Assessment & Plan   ACTINIC DAMAGE - chronic, secondary to cumulative UV radiation exposure/sun exposure over time - diffuse scaly erythematous macules with underlying dyspigmentation - Recommend daily broad spectrum sunscreen SPF 30+ to sun-exposed areas, reapply every 2 hours as needed.  - Recommend staying in the shade or wearing long sleeves, sun glasses (UVA+UVB protection) and wide brim hats (4-inch brim around the entire circumference of the hat). - Call for new or changing lesions.  STASIS DERMATITIS Exam: Erythema and scale at R lower leg with associated swelling  Chronic and persistent condition with duration or expected duration over one year. Condition is bothersome/symptomatic for patient. Currently flared.  Rash came up on leg after bout with A-Fib.   Stasis in the legs causes chronic leg swelling, which may result in itchy or painful  rashes, skin discoloration, skin texture changes, and sometimes ulceration.  Recommend daily graduated compression hose/stockings- easiest to put on first thing in morning, remove at bedtime.  Elevate legs as much as possible. Avoid salt/sodium rich foods.  Treatment Plan: Start TMC 0.1% cream, apply 1-2 times daily to aa, lower legs for up to 2 weeks. If not resolving, may try anti-fungal cream, patient advised to let us  know if not improving. Avoid applying to face, groin, and axilla. Use as directed. Long-term use can cause thinning of the skin.  Topical steroids (such as triamcinolone , fluocinolone, fluocinonide, mometasone , clobetasol , halobetasol, betamethasone , hydrocortisone ) can cause thinning and lightening of the skin if they are used for too long in the same area. Your physician has selected the right strength medicine for your problem and area affected on the body. Please use your medication only as directed by your physician to prevent side effects.   SQUAMOUS CELL CARCINOMA IN SITU (SCCIS) Left upper temple Destruction of lesion  Destruction method: electrodesiccation and curettage   Informed consent: discussed and consent obtained   Timeout:  patient name, date of birth, surgical site, and procedure verified Patient was prepped and draped in usual sterile fashion: prepped with alcohol. Anesthesia: the lesion was anesthetized in a standard fashion   Anesthetic:  1% lidocaine  w/ epinephrine  1-100,000 local infiltration Curettage performed in three different directions: Yes   Electrodesiccation performed over the curetted area: Yes   Final wound size (cm):  1.7 Hemostasis achieved with:  pressure, aluminum chloride and electrodesiccation Outcome: patient tolerated procedure well with no complications   Post-procedure details:  wound care instructions given   Additional details:  Mupirocin ointment and Bandaid applied   EDC today Accession #: [id number]  INFLAMED SEBORRHEIC  KERATOSIS (2) R pretibia x1, L calf x1 (2) Symptomatic, irritating, patient would like treated.  Will recheck at follow-up scheduled. Destruction of lesion - R pretibia x1, L calf x1 (2)  Destruction method: cryotherapy   Informed consent: discussed and consent obtained   Lesion destroyed using liquid nitrogen: Yes   Region frozen until ice ball extended beyond lesion: Yes   Outcome: patient tolerated procedure well with no complications   Post-procedure details: wound care instructions given   Additional details:  Prior to procedure, discussed risks of blister formation, small wound, skin dyspigmentation, or rare scar following cryotherapy. Recommend Vaseline ointment to treated areas while healing.    Return for As scheduled, w/ Dr. Jackquline, ISK follow-up, recheck L temple.  I, Jacquelynn V. Wilfred, CMA, am acting as scribe for Rexene Jackquline, MD .   Documentation: I have reviewed the above documentation for accuracy and completeness, and I agree with the above.  Rexene Jackquline, MD

## 2024-06-17 DIAGNOSIS — H2513 Age-related nuclear cataract, bilateral: Secondary | ICD-10-CM | POA: Diagnosis not present

## 2024-06-17 DIAGNOSIS — H259 Unspecified age-related cataract: Secondary | ICD-10-CM | POA: Diagnosis not present

## 2024-06-17 DIAGNOSIS — H34832 Tributary (branch) retinal vein occlusion, left eye, with macular edema: Secondary | ICD-10-CM | POA: Diagnosis not present

## 2024-06-21 DIAGNOSIS — G4733 Obstructive sleep apnea (adult) (pediatric): Secondary | ICD-10-CM | POA: Diagnosis not present

## 2024-06-27 ENCOUNTER — Telehealth: Payer: Self-pay

## 2024-06-27 ENCOUNTER — Ambulatory Visit (INDEPENDENT_AMBULATORY_CARE_PROVIDER_SITE_OTHER)

## 2024-06-27 DIAGNOSIS — E538 Deficiency of other specified B group vitamins: Secondary | ICD-10-CM

## 2024-06-27 MED ORDER — CYANOCOBALAMIN 1000 MCG/ML IJ SOLN
1000.0000 ug | Freq: Once | INTRAMUSCULAR | Status: AC
  Administered 2024-06-27: 1000 ug via INTRAMUSCULAR

## 2024-06-27 NOTE — Telephone Encounter (Signed)
 Copied from CRM [id number]. Topic: Clinical - Home Health Verbal Orders >> Jun 27, 2024  2:42 PM Donna BRAVO wrote: Caller/Agency: Delphine Lavella Ivey Bernerd  Callback Number: [phone] Service Requested: Occupational Therapy Frequency: N/A Any new concerns about the patient? Yes  patient fell on 06/23/24  request for an assessment for OT

## 2024-06-27 NOTE — Progress Notes (Signed)
 After obtaining consent, and per orders of Dr. Jimmy, injection of B12 given IM in right deltoid by Sebastian Rosina Helling. Patient tolerated injection well.   Nurse visit sent to Dr. Jimmy due to Dr. Cleatus being out of the office today.

## 2024-06-28 ENCOUNTER — Ambulatory Visit: Payer: Self-pay

## 2024-06-28 NOTE — Telephone Encounter (Signed)
 FYI Only or Action Required?: Action required by provider: request for appointment.  Patient was last seen in primary care on 04/02/2024 by Cleatus Arlyss RAMAN, MD.  Called Nurse Triage reporting Pain and Fatigue.  Symptoms began several months ago.  Interventions attempted: OTC medications: Tylenol  and Rest, hydration, or home remedies.  Symptoms are: gradually worsening.  Triage Disposition: See Physician Within 24 Hours  Patient/caregiver understands and will follow disposition?: No, refuses disposition  Copied from CRM #[id number]. Topic: Clinical - Red Word Triage >> Jun 28, 2024  5:02 PM [place] J wrote: [facility] that prompted transfer to Nurse Triage: Patient has been feeling a lot of pain from his arthritis and he's been feeling more fatigued than lately. ----------------------------------------------------------------------- From previous Reason for Contact - Scheduling: Patient/patient representative is calling to schedule an appointment. Refer to attachments for appointment information. Reason for Disposition  [1] MODERATE weakness (e.g., interferes with work, school, normal activities) AND [2] persists > 3 days  Answer Assessment - Initial Assessment Questions 1. DESCRIPTION: Describe how you are feeling.     Fatigued  2. SEVERITY: How bad is it?  Can you stand and walk?     I don't do much walking  3. ONSET: When did these symptoms begin? (e.g., hours, days, weeks, months)     6 months  4. CAUSE: What do you think is causing the weakness or fatigue? (e.g., not drinking enough fluids, medical problem, trouble sleeping)     Arthritis pain 5. NEW MEDICINES:  Have you started on any new medicines recently? (e.g., opioid pain medicines, benzodiazepines, muscle relaxants, antidepressants, antihistamines, neuroleptics, beta blockers)     Not asked  6. OTHER SYMPTOMS: Do you have any other symptoms? (e.g., chest pain, fever, cough, SOB, vomiting, diarrhea, bleeding,  other areas of pain)     Denies   Additional info: Tylenol  not helping.  No appointments are available with pcp until August 7th, patient requesting to schedule with [facility] on Monday. Urgent care advised for increasing pain he is unable to manage and moderate fatigue that is worsening. They decline urgent care and will call back on Monday to schedule with pcp or Matt.  Protocols used: Weakness (Generalized) and Fatigue-A-AH

## 2024-06-30 NOTE — Telephone Encounter (Signed)
 Please give the order.  Thanks.

## 2024-07-01 NOTE — Telephone Encounter (Signed)
 Left message on secure line providing the order

## 2024-07-02 ENCOUNTER — Encounter: Payer: Self-pay | Admitting: Family Medicine

## 2024-07-02 ENCOUNTER — Ambulatory Visit (INDEPENDENT_AMBULATORY_CARE_PROVIDER_SITE_OTHER): Admitting: Family Medicine

## 2024-07-02 VITALS — BP 108/72 | HR 65 | Temp 98.5°F | Resp 20 | Ht 70.0 in | Wt 246.2 lb

## 2024-07-02 DIAGNOSIS — R350 Frequency of micturition: Secondary | ICD-10-CM

## 2024-07-02 DIAGNOSIS — R1310 Dysphagia, unspecified: Secondary | ICD-10-CM | POA: Diagnosis not present

## 2024-07-02 DIAGNOSIS — R5383 Other fatigue: Secondary | ICD-10-CM

## 2024-07-02 LAB — POC URINALSYSI DIPSTICK (AUTOMATED)
Bilirubin, UA: NEGATIVE
Blood, UA: NEGATIVE
Glucose, UA: NEGATIVE
Ketones, UA: NEGATIVE
Leukocytes, UA: NEGATIVE
Nitrite, UA: NEGATIVE
Protein, UA: NEGATIVE
Spec Grav, UA: 1.02 (ref 1.010–1.025)
Urobilinogen, UA: 0.2 U/dL
pH, UA: 5.5 (ref 5.0–8.0)

## 2024-07-02 MED ORDER — CITRUCEL PO POWD
1.0000 | Freq: Every day | ORAL | Status: DC
Start: 2024-07-02 — End: 2024-08-29

## 2024-07-02 NOTE — Patient Instructions (Addendum)
 Let us  culture your urine and update you about that.  Please verify the dose and name of the antibiotic at home and update us  about that.  Take care.  Glad to see you. I'll check on speech therapy in the meantime.

## 2024-07-02 NOTE — Progress Notes (Unsigned)
 He had started a new BiPAP, got out of bed and fell- he was having a nightmare at the time.  Discussed BiPAP use and routine cautions.  Discussed fatigue in the setting of untreated sleep apnea.  He has a fall button to use now if needed. He got a bed rail.  Safety discussed with patient.  D/w pt about LUTS- frequency.  U/a wnl.  Ucx pending.  No fevers.  Still on flomax .  He is taking  lasix  prn. No burning with urination.    He took an old rx abx recently.  See MyChart message.  I asked patient to verify the dose and medication that he had been using.  He tolerated citracel more than miralax .  Med list updated.  Would defer ED tx until the above is resolved.   He has more trouble with swallowing fluids.  Going on for about 6 months, not escalating.  Rare troubles with solids.  Mid esophageal sx.  Not choking.  Not having GERD sx. discussed getting speech therapy set up.  Meds, vitals, and allergies reviewed.   ROS: Per HPI unless specifically indicated in ROS section   [place]ck supple, no LA Rrr Ctab trace BLE edema.   Abd soft, not ttp.   Skin well-perfused.

## 2024-07-03 ENCOUNTER — Telehealth: Payer: Self-pay | Admitting: Family Medicine

## 2024-07-03 ENCOUNTER — Ambulatory Visit: Payer: Self-pay | Admitting: Family Medicine

## 2024-07-03 DIAGNOSIS — R1310 Dysphagia, unspecified: Secondary | ICD-10-CM | POA: Insufficient documentation

## 2024-07-03 LAB — URINE CULTURE
MICRO NUMBER:: 16759431
Result:: NO GROWTH
SPECIMEN QUALITY:: ADEQUATE

## 2024-07-03 NOTE — Telephone Encounter (Signed)
 I put in the referral for speech therapy for dysphagia.  I do not know if a separate order needs to be sent to [facility] to see if it can be set up there.  Please check on that.  Thanks.

## 2024-07-03 NOTE — Telephone Encounter (Signed)
 See result note.

## 2024-07-03 NOTE — Assessment & Plan Note (Signed)
 Unclear if this is from BPH versus cystitis.  I asked him to verify the medications that he had been taking at home, i.e. which antibiotic.  See notes on urine culture.  Discussed that he may get a false negative result.  At this point still okay for outpatient follow-up.

## 2024-07-03 NOTE — Assessment & Plan Note (Signed)
 Suspect that sleep apnea at least contributes to fatigue.  Discussed treating sleep apnea with BiPAP to see if that helps with his fatigue level.

## 2024-07-03 NOTE — Assessment & Plan Note (Signed)
 Noted with liquids, over the last 6 months, not escalating.  Discussed getting speech therapy set up.

## 2024-07-03 NOTE — Telephone Encounter (Signed)
 See OV note.  Thanks.

## 2024-07-04 NOTE — Telephone Encounter (Signed)
 Reached out to [facility] about setting up speech therapy, voicemail left.

## 2024-07-08 DIAGNOSIS — D6869 Other thrombophilia: Secondary | ICD-10-CM | POA: Diagnosis not present

## 2024-07-09 DIAGNOSIS — H35363 Drusen (degenerative) of macula, bilateral: Secondary | ICD-10-CM | POA: Diagnosis not present

## 2024-07-09 DIAGNOSIS — H47239 Glaucomatous optic atrophy, unspecified eye: Secondary | ICD-10-CM | POA: Diagnosis not present

## 2024-07-09 DIAGNOSIS — H538 Other visual disturbances: Secondary | ICD-10-CM | POA: Diagnosis not present

## 2024-07-09 DIAGNOSIS — H2513 Age-related nuclear cataract, bilateral: Secondary | ICD-10-CM | POA: Diagnosis not present

## 2024-07-10 NOTE — Telephone Encounter (Signed)
 This referral was faxed to [facility] on 07/04/2024  Landy Gaylon HERO, CMA     07/04/24  12:31:13 PM   LM for [facility] to call me back regarding this patients referral    Please let me know if you hear back from them from the message you left. If you do not hear back I can take over the follow up process. Let me know.

## 2024-07-10 NOTE — Telephone Encounter (Signed)
Checking on status of referral.

## 2024-07-11 ENCOUNTER — Telehealth: Payer: Self-pay

## 2024-07-11 NOTE — Telephone Encounter (Signed)
 Copied from CRM [id number]. Topic: Clinical - Order For Equipment >> Jul 11, 2024  9:56 AM Celestine FALCON wrote: Reason for CRM: Pt stated he received his new CPAP machine, but he needs his pressure settings reset. Pt stated he believes he went through [facility] for his CPAP.   Pt's phone number is [phone] ok to leave a vm.

## 2024-07-11 NOTE — Telephone Encounter (Signed)
 ATC the patient. The line just rang but I did not get a voicemail. I will try again later.

## 2024-07-11 NOTE — Telephone Encounter (Signed)
 I have not heard anything and I have not received anything either. Thank you

## 2024-07-12 NOTE — Telephone Encounter (Signed)
 LM for PT Dept to call me back regarding this referral   See referral notes for further follow up

## 2024-07-15 NOTE — Telephone Encounter (Signed)
 Contacting [facility] to be added to his Air[place] to remotely adjust machine, will contact pt once we are connected to adjust settings and trouble shoot.

## 2024-07-15 NOTE — Telephone Encounter (Signed)
 Copied from CRM [id number]. Topic: Clinical - Prescription Issue >> Jul 12, 2024  2:58 PM Rilla B wrote: Reason for CRM: Patient received his CPAP. State the pressure is too high.  Mask was not leaking when he held it in place. He states it should be half of what it is. Please call patient home [phone] or wife's cell [phone]. >> Jul 12, 2024  5:08 PM Rozanna G wrote: PT CALLED BACK STATED NO ONE CALLED HER BACK TODAY, STATED NO ONE ADVIISED THHEM THAT THE CLINIC CLOSE EARLY TODAY. THEY ARE NOT PLEASED AT ALL RIGHT NOW. STATED THEY NEED SOMEONE TO ADJUST HIS CPAP MACHINE

## 2024-07-18 ENCOUNTER — Other Ambulatory Visit: Payer: Self-pay

## 2024-07-18 ENCOUNTER — Inpatient Hospital Stay
Admission: EM | Admit: 2024-07-18 | Discharge: 2024-07-22 | DRG: 291 | Disposition: A | Discharge status: Home Health | Attending: Internal Medicine | Admitting: Internal Medicine

## 2024-07-18 ENCOUNTER — Encounter: Payer: Self-pay | Admitting: Emergency Medicine

## 2024-07-18 ENCOUNTER — Emergency Department

## 2024-07-18 DIAGNOSIS — Z6834 Body mass index (BMI) 34.0-34.9, adult: Secondary | ICD-10-CM

## 2024-07-18 DIAGNOSIS — Z7901 Long term (current) use of anticoagulants: Secondary | ICD-10-CM

## 2024-07-18 DIAGNOSIS — J441 Chronic obstructive pulmonary disease with (acute) exacerbation: Secondary | ICD-10-CM | POA: Diagnosis present

## 2024-07-18 DIAGNOSIS — G4733 Obstructive sleep apnea (adult) (pediatric): Secondary | ICD-10-CM | POA: Diagnosis present

## 2024-07-18 DIAGNOSIS — Z85828 Personal history of other malignant neoplasm of skin: Secondary | ICD-10-CM | POA: Diagnosis not present

## 2024-07-18 DIAGNOSIS — I2510 Atherosclerotic heart disease of native coronary artery without angina pectoris: Secondary | ICD-10-CM | POA: Diagnosis not present

## 2024-07-18 DIAGNOSIS — N401 Enlarged prostate with lower urinary tract symptoms: Secondary | ICD-10-CM | POA: Diagnosis not present

## 2024-07-18 DIAGNOSIS — E876 Hypokalemia: Secondary | ICD-10-CM | POA: Diagnosis not present

## 2024-07-18 DIAGNOSIS — E669 Obesity, unspecified: Secondary | ICD-10-CM | POA: Diagnosis not present

## 2024-07-18 DIAGNOSIS — J9622 Acute and chronic respiratory failure with hypercapnia: Secondary | ICD-10-CM | POA: Diagnosis not present

## 2024-07-18 DIAGNOSIS — C44320 Squamous cell carcinoma of skin of unspecified parts of face: Secondary | ICD-10-CM | POA: Diagnosis not present

## 2024-07-18 DIAGNOSIS — I10 Essential (primary) hypertension: Secondary | ICD-10-CM | POA: Diagnosis not present

## 2024-07-18 DIAGNOSIS — R600 Localized edema: Secondary | ICD-10-CM | POA: Diagnosis not present

## 2024-07-18 DIAGNOSIS — Z79899 Other long term (current) drug therapy: Secondary | ICD-10-CM

## 2024-07-18 DIAGNOSIS — R531 Weakness: Secondary | ICD-10-CM

## 2024-07-18 DIAGNOSIS — I5033 Acute on chronic diastolic (congestive) heart failure: Secondary | ICD-10-CM | POA: Diagnosis not present

## 2024-07-18 DIAGNOSIS — I4891 Unspecified atrial fibrillation: Secondary | ICD-10-CM | POA: Diagnosis not present

## 2024-07-18 DIAGNOSIS — Z1152 Encounter for screening for COVID-19: Secondary | ICD-10-CM

## 2024-07-18 DIAGNOSIS — I110 Hypertensive heart disease with heart failure: Principal | ICD-10-CM | POA: Diagnosis present

## 2024-07-18 DIAGNOSIS — R0989 Other specified symptoms and signs involving the circulatory and respiratory systems: Secondary | ICD-10-CM | POA: Diagnosis not present

## 2024-07-18 DIAGNOSIS — Z888 Allergy status to other drugs, medicaments and biological substances status: Secondary | ICD-10-CM

## 2024-07-18 DIAGNOSIS — I4892 Unspecified atrial flutter: Secondary | ICD-10-CM | POA: Diagnosis not present

## 2024-07-18 DIAGNOSIS — I480 Paroxysmal atrial fibrillation: Secondary | ICD-10-CM | POA: Diagnosis present

## 2024-07-18 DIAGNOSIS — E785 Hyperlipidemia, unspecified: Secondary | ICD-10-CM | POA: Diagnosis present

## 2024-07-18 DIAGNOSIS — J9601 Acute respiratory failure with hypoxia: Secondary | ICD-10-CM | POA: Diagnosis not present

## 2024-07-18 DIAGNOSIS — R0602 Shortness of breath: Secondary | ICD-10-CM | POA: Diagnosis not present

## 2024-07-18 DIAGNOSIS — Z8261 Family history of arthritis: Secondary | ICD-10-CM

## 2024-07-18 DIAGNOSIS — R509 Fever, unspecified: Secondary | ICD-10-CM | POA: Diagnosis not present

## 2024-07-18 DIAGNOSIS — E66811 Obesity, class 1: Secondary | ICD-10-CM | POA: Diagnosis present

## 2024-07-18 DIAGNOSIS — Z91148 Patient's other noncompliance with medication regimen for other reason: Secondary | ICD-10-CM

## 2024-07-18 DIAGNOSIS — Z885 Allergy status to narcotic agent status: Secondary | ICD-10-CM

## 2024-07-18 DIAGNOSIS — Z96642 Presence of left artificial hip joint: Secondary | ICD-10-CM | POA: Diagnosis present

## 2024-07-18 DIAGNOSIS — J9602 Acute respiratory failure with hypercapnia: Secondary | ICD-10-CM | POA: Diagnosis not present

## 2024-07-18 DIAGNOSIS — Z825 Family history of asthma and other chronic lower respiratory diseases: Secondary | ICD-10-CM | POA: Diagnosis not present

## 2024-07-18 DIAGNOSIS — J9621 Acute and chronic respiratory failure with hypoxia: Principal | ICD-10-CM | POA: Diagnosis present

## 2024-07-18 DIAGNOSIS — I5032 Chronic diastolic (congestive) heart failure: Secondary | ICD-10-CM | POA: Diagnosis present

## 2024-07-18 DIAGNOSIS — I5031 Acute diastolic (congestive) heart failure: Secondary | ICD-10-CM | POA: Diagnosis not present

## 2024-07-18 HISTORY — DX: Obesity, unspecified: E66.9

## 2024-07-18 HISTORY — DX: Obstructive sleep apnea (adult) (pediatric): G47.33

## 2024-07-18 HISTORY — DX: Other ill-defined heart diseases: I51.89

## 2024-07-18 HISTORY — DX: Personal history of other medical treatment: Z92.89

## 2024-07-18 HISTORY — DX: Unspecified right bundle-branch block: I45.10

## 2024-07-18 HISTORY — DX: Chronic obstructive pulmonary disease, unspecified: J44.9

## 2024-07-18 HISTORY — DX: Acute respiratory failure with hypoxia: J96.01

## 2024-07-18 LAB — RESP PANEL BY RT-PCR (RSV, FLU A&B, COVID)  RVPGX2
Influenza A by PCR: NEGATIVE
Influenza B by PCR: NEGATIVE
Resp Syncytial Virus by PCR: NEGATIVE
SARS Coronavirus 2 by RT PCR: NEGATIVE

## 2024-07-18 LAB — BLOOD GAS, VENOUS
Acid-Base Excess: 7.3 mmol/L — ABNORMAL HIGH (ref 0.0–2.0)
Bicarbonate: 36.3 mmol/L — ABNORMAL HIGH (ref 20.0–28.0)
O2 Saturation: 46.3 %
Patient temperature: 37
pCO2, Ven: 72 mmHg (ref 44–60)
pH, Ven: 7.31 (ref 7.25–7.43)
pO2, Ven: 32 mmHg (ref 32–45)

## 2024-07-18 LAB — URINALYSIS, ROUTINE W REFLEX MICROSCOPIC
Bilirubin Urine: NEGATIVE
Glucose, UA: NEGATIVE mg/dL
Hgb urine dipstick: NEGATIVE
Ketones, ur: NEGATIVE mg/dL
Leukocytes,Ua: NEGATIVE
Nitrite: NEGATIVE
Protein, ur: NEGATIVE mg/dL
Specific Gravity, Urine: 1.017 (ref 1.005–1.030)
pH: 5 (ref 5.0–8.0)

## 2024-07-18 LAB — COMPREHENSIVE METABOLIC PANEL WITH GFR
ALT: 10 U/L (ref 0–44)
AST: 12 U/L — ABNORMAL LOW (ref 15–41)
Albumin: 3.5 g/dL (ref 3.5–5.0)
Alkaline Phosphatase: 42 U/L (ref 38–126)
Anion gap: 5 (ref 5–15)
BUN: 12 mg/dL (ref 8–23)
CO2: 31 mmol/L (ref 22–32)
Calcium: 8.5 mg/dL — ABNORMAL LOW (ref 8.9–10.3)
Chloride: 103 mmol/L (ref 98–111)
Creatinine, Ser: 0.76 mg/dL (ref 0.61–1.24)
GFR, Estimated: >60 mL/min (ref >60)
Glucose, Bld: 101 mg/dL — ABNORMAL HIGH (ref 70–99)
Potassium: 4.4 mmol/L (ref 3.5–5.1)
Sodium: 139 mmol/L (ref 135–145)
Total Bilirubin: 1.3 mg/dL — ABNORMAL HIGH (ref 0.0–1.2)
Total Protein: 6.6 g/dL (ref 6.5–8.1)

## 2024-07-18 LAB — CBC WITH DIFFERENTIAL/PLATELET
Abs Immature Granulocytes: 0.03 K/uL (ref 0.00–0.07)
Basophils Absolute: 0 K/uL (ref 0.0–0.1)
Basophils Relative: 0 %
Eosinophils Absolute: 0.1 K/uL (ref 0.0–0.5)
Eosinophils Relative: 2 %
HCT: 44.1 % (ref 39.0–52.0)
Hemoglobin: 13.9 g/dL (ref 13.0–17.0)
Immature Granulocytes: 0 %
Lymphocytes Relative: 17 %
Lymphs Abs: 1.2 K/uL (ref 0.7–4.0)
MCH: 32.1 pg (ref 26.0–34.0)
MCHC: 31.5 g/dL (ref 30.0–36.0)
MCV: 101.8 fL — ABNORMAL HIGH (ref 80.0–100.0)
Monocytes Absolute: 0.7 K/uL (ref 0.1–1.0)
Monocytes Relative: 10 %
Neutro Abs: 5.1 K/uL (ref 1.7–7.7)
Neutrophils Relative %: 71 %
Platelets: 184 K/uL (ref 150–400)
RBC: 4.33 MIL/uL (ref 4.22–5.81)
RDW: 12.7 % (ref 11.5–15.5)
WBC: 7.2 K/uL (ref 4.0–10.5)
nRBC: 0 % (ref 0.0–0.2)

## 2024-07-18 LAB — BRAIN NATRIURETIC PEPTIDE: B Natriuretic Peptide: 200.7 pg/mL — ABNORMAL HIGH (ref 0.0–100.0)

## 2024-07-18 LAB — LACTIC ACID, PLASMA
Lactic Acid, Venous: 0.6 mmol/L (ref 0.5–1.9)
Lactic Acid, Venous: 1.3 mmol/L (ref 0.5–1.9)

## 2024-07-18 LAB — RESPIRATORY PANEL BY PCR

## 2024-07-18 LAB — TROPONIN I (HIGH SENSITIVITY)
Troponin I (High Sensitivity): 8 ng/L (ref <18)
Troponin I (High Sensitivity): 7 ng/L (ref <18)

## 2024-07-18 MED ORDER — POLYETHYLENE GLYCOL 3350 17 G PO PACK
17.0000 g | PACK | Freq: Every day | ORAL | Status: DC | PRN
  Administered 2024-07-20 – 2024-07-21 (×2): 17 g via ORAL
  Filled 2024-07-18 (×3): qty 1

## 2024-07-18 MED ORDER — ENOXAPARIN SODIUM 40 MG/0.4ML IJ SOSY: 40.0000 mg | PREFILLED_SYRINGE | INTRAMUSCULAR | Status: DC

## 2024-07-18 MED ORDER — CYANOCOBALAMIN 1000 MCG/ML IJ SOLN
1000.0000 ug | INTRAMUSCULAR | Status: DC
  Administered 2024-07-19: 1000 ug via INTRAMUSCULAR
  Filled 2024-07-18: qty 1

## 2024-07-18 MED ORDER — METOPROLOL TARTRATE 25 MG PO TABS
25.0000 mg | ORAL_TABLET | Freq: Two times a day (BID) | ORAL | Status: DC
  Administered 2024-07-18 – 2024-07-22 (×8): 25 mg via ORAL
  Filled 2024-07-18 (×8): qty 1

## 2024-07-18 MED ORDER — FUROSEMIDE 10 MG/ML IJ SOLN
40.0000 mg | Freq: Two times a day (BID) | INTRAMUSCULAR | Status: DC
  Administered 2024-07-18 – 2024-07-21 (×6): 40 mg via INTRAVENOUS
  Filled 2024-07-18 (×6): qty 4

## 2024-07-18 MED ORDER — TAMSULOSIN HCL 0.4 MG PO CAPS
0.4000 mg | ORAL_CAPSULE | Freq: Every day | ORAL | Status: DC
  Administered 2024-07-19 – 2024-07-22 (×4): 0.4 mg via ORAL
  Filled 2024-07-18 (×5): qty 1

## 2024-07-18 MED ORDER — GABAPENTIN 300 MG PO CAPS
300.0000 mg | ORAL_CAPSULE | Freq: Every day | ORAL | Status: DC
  Administered 2024-07-18 – 2024-07-21 (×4): 300 mg via ORAL
  Filled 2024-07-18 (×4): qty 1

## 2024-07-18 MED ORDER — ACETAMINOPHEN 325 MG PO TABS
650.0000 mg | ORAL_TABLET | Freq: Four times a day (QID) | ORAL | Status: DC | PRN
  Administered 2024-07-20: 650 mg via ORAL
  Filled 2024-07-18: qty 2

## 2024-07-18 MED ORDER — ACETAMINOPHEN 650 MG RE SUPP: 650.0000 mg | Freq: Four times a day (QID) | RECTAL | Status: DC | PRN

## 2024-07-18 MED ORDER — APIXABAN 5 MG PO TABS
5.0000 mg | ORAL_TABLET | Freq: Two times a day (BID) | ORAL | Status: DC
  Administered 2024-07-18 – 2024-07-22 (×8): 5 mg via ORAL
  Filled 2024-07-18 (×8): qty 1

## 2024-07-18 MED ORDER — FUROSEMIDE 10 MG/ML IJ SOLN
40.0000 mg | Freq: Once | INTRAMUSCULAR | Status: AC
  Administered 2024-07-18: 40 mg via INTRAVENOUS
  Filled 2024-07-18: qty 4

## 2024-07-18 NOTE — ED Triage Notes (Signed)
 BIB EMS from independent living at [facility] . Began feeling body aches and low energy all day yesterday staying in bed.  SPO2 was in the 70s on room air on arrival of EMS.  Is currently on 4L [place].  Tylenol  last night.  BP 149/68, CO2 62, 27-30 RR 99.6 temp.  Pt reports swelling in his legs is normal for him.

## 2024-07-18 NOTE — Progress Notes (Signed)
 Patient not tolerating BIPAP.  Patient took mask off around 5 oclock pm and stated he can not wear it do to claustrophobia.  Patient placed back on 2lpm [place].

## 2024-07-18 NOTE — ED Provider Notes (Signed)
 [facility] Provider Note    Event Date/Time   First MD Initiated Contact with Patient 07/18/24 1332     (approximate)   History   Weakness   HPI  Todd Gonzales is a 83 y.o. male who comes in from independent living at [facility] with body aches low energy and yesterday staying in bed all day.  She was 70% on room air so placed on 4 L.  Patient does report history of A-fib on blood thinner but denies any falls and hitting his head.  Denies any chest pain, abdominal pain he just reports feeling really fatigued.  Wife is now at bedside he does report that he just more fatigue than normal.  He has been making some gurgling sounds at night.  He does report that he is not taking any of his Lasix  recently.  Physical Exam   Triage Vital Signs: ED Triage Vitals [07/18/24 1329]  Encounter Vitals Group     BP      Girls Systolic BP Percentile      Girls Diastolic BP Percentile      Boys Systolic BP Percentile      Boys Diastolic BP Percentile      Pulse      Resp      Temp      Temp src      SpO2      Weight      Height      Head Circumference      Peak Flow      Pain Score 0     Pain Loc      Pain Education      Exclude from Growth Chart     Most recent vital signs: Vitals:   07/18/24 1340 07/18/24 1433  BP:    Pulse:    Resp: (!) 22   Temp:  99.4 F (37.4 C)  SpO2:       General: Awake, no distress.  CV:  Good peripheral perfusion.  Resp:  Normal effort.  Clear lungs Abd:  No distention.  Soft and nontender Other:  2+ edema noted bilaterally   ED Results / Procedures / Treatments   Labs (all labs ordered are listed, but only abnormal results are displayed) Labs Reviewed  CBC WITH DIFFERENTIAL/PLATELET - Abnormal; Notable for the following components:      Result Value   MCV 101.8 (*)    All other components within normal limits  COMPREHENSIVE METABOLIC PANEL WITH GFR - Abnormal; Notable for the following components:    Glucose, Bld 101 (*)    Calcium  8.5 (*)    AST 12 (*)    Total Bilirubin 1.3 (*)    All other components within normal limits  BRAIN NATRIURETIC PEPTIDE - Abnormal; Notable for the following components:   B Natriuretic Peptide 200.7 (*)    All other components within normal limits  URINALYSIS, ROUTINE W REFLEX MICROSCOPIC - Abnormal; Notable for the following components:   Color, Urine YELLOW (*)    APPearance CLEAR (*)    All other components within normal limits  RESP PANEL BY RT-PCR (RSV, FLU A&B, COVID)  [id number]  CULTURE, BLOOD (ROUTINE X 2)  CULTURE, BLOOD (ROUTINE X 2)  LACTIC ACID, PLASMA  LACTIC ACID, PLASMA  BLOOD GAS, VENOUS  TROPONIN I (HIGH SENSITIVITY)     EKG  My interpretation of EKG:  Normal sinus rhythm 2 without any ST elevation, T wave version lead III, incomplete right  bundle branch block  RADIOLOGY I have reviewed the xray personally and interpreted positive central venous congestion   PROCEDURES:  Critical Care performed: Yes, see critical care procedure note(s)  .1-3 Lead EKG Interpretation  Performed by: Ernest Ronal BRAVO, MD Authorized by: Ernest Ronal BRAVO, MD     Interpretation: normal     ECG rate:  70   ECG rate assessment: normal     Rhythm: sinus rhythm     Ectopy: none     Conduction: normal   .Critical Care  Performed by: Ernest Ronal BRAVO, MD Authorized by: Ernest Ronal BRAVO, MD   Critical care provider statement:    Critical care time (minutes):  30   Critical care was necessary to treat or prevent imminent or life-threatening deterioration of the following conditions:  Respiratory failure   Critical care was time spent personally by me on the following activities:  Development of treatment plan with patient or surrogate, discussions with consultants, evaluation of patient's response to treatment, examination of patient, ordering and review of laboratory studies, ordering and review of radiographic studies, ordering and performing treatments and  interventions, pulse oximetry, re-evaluation of patient's condition and review of old charts    MEDICATIONS ORDERED IN ED: Medications  furosemide  (LASIX ) injection 40 mg (has no administration in time range)     IMPRESSION / MDM / ASSESSMENT AND PLAN / ED COURSE  I reviewed the triage vital signs and the nursing notes.   Patient's presentation is most consistent with severe exacerbation of chronic illness.   Differential includes pneumonia, viral, CHF.  Patient does have some low-grade temperatures.  X-ray without evidence of obvious pneumonia.  Does have some edema.  Will give a dose of Lasix .  I did get a VBG to make sure no hypercapnia   CO2 slightly elevated but less looks chronic in nature has been similar to prior he has got no acidosis elevated bicarb which shows chronic retention.  He is currently on 4 L of oxygen.  I will give him a dose of Lasix  for some diuresis.  COVID and flu were negative therefore I will add on a viral panel.  Will add on procalcitonin.  However patient does not have any white count and seems less likely to be infectious.  Troponin is negative   Discussed the hospitalist given the elevated CO2 they do want a place patient on BiPAP.  The patient is on the cardiac monitor to evaluate for evidence of arrhythmia and/or significant heart rate changes.      FINAL CLINICAL IMPRESSION(S) / ED DIAGNOSES   Final diagnoses:  Acute on chronic respiratory failure with hypoxia and hypercapnia (HCC)     Rx / DC Orders   ED Discharge Orders     None        Note:  This document was prepared using Dragon voice recognition software and may include unintentional dictation errors.   Ernest Ronal BRAVO, MD 07/18/24 [phone]

## 2024-07-18 NOTE — H&P (Signed)
 History and Physical    Todd Gonzales FMW:[id number] DOB: May 16, 1941 DOA: 07/18/2024  DOS: the patient was seen and examined on 07/18/2024  PCP: Cleatus Arlyss RAMAN, MD   Patient coming from: Home  I have personally briefly reviewed patient's old medical records in [facility]  Chief Complaint: Shortness of breath  HPI: Todd Gonzales is a pleasant 83 y.o. male with medical history significant for morbid obesity, HFpEF, CAD nonocclusive, A-fib on Eliquis , obstructive sleep apnea not on CPAP regularly, HTN, HLD who presented to ED at [facility] from [facility] independent living center for body aches, pains, low energy all day yesterday and tired when getting up this morning with shortness of breath.  Patient oxygen was checked which was 70 on room air on arrival of EMS.  He was placed on oxygen.  He also reports his legs are swollen.  He has not been using diuretics as he had to go to bathroom multiple times. He denies any fever chills, nausea vomiting, chest pain, palpitations.  ED Course: Upon arrival to the ED, patient is found to be fluid overloaded, hypoxemic, bilateral leg swelling, with CO2 retention around 30.  His COVID and flu were negative.  Chest x-ray showed no pneumonia but some congestion.  His troponins were negative.  Patient was given Lasix , placed him on BiPAP, oxygen and hospitalist service was consulted for evaluation for admission.  Review of Systems:  ROS  All other systems negative except as noted in the HPI.  Past Medical History:  Diagnosis Date   Actinic keratosis 03/28/2023   glabella, needs LN2   Aortic atherosclerosis (HCC)    a. Noted on CT 02/2023.   Arthritis    Basal cell carcinoma 03/28/2023   right paranasal, no tx, cleared with bx   BPH (benign prostatic hyperplasia)    nocturia x4 at baseline   CAD (coronary artery disease)    a. 08/2023 Cor CTA: LM nl, LAD 25-49, LCX <25, RCA 25-49. Ca2+ = 409 (43rd %'ile).   Chronic heart failure with preserved  ejection fraction (HFpEF) (HCC)    a. 04/2023 Echo: EF 60-65%, no rwma, mild LVH, nl RV fxn, triv MR; b. 12/2023 TEE: EF 60-65%, nl RV size/fxn, mildly dil LA, no LA/LAA thrombus, mild-mod MR, mild AI.   H/O hiatal hernia    Hemorrhoids    Hyperlipidemia    Hypertension    Dr Vernadine- LOV with clearance 3/13 on chart   IGT (impaired glucose tolerance)    last AIC 5.5- diet controlled- per office note 3/13   Mitral regurgitation    a. 12/2023 TEE: mild to mod MR in setting of afib.   Paroxysmal atrial fibrillation (HCC) 04/14/2023   a. 04/2023 Admitted with A-fib RVR.  Normal echo.  Discharged on amiodarone ; b. 05/2023 Zio: Predominantly sinus rhythm at 66 (50-88).  First-degree AV block.  4 beats of NSVT.  No sustained arrhythmias.  Amiodarone  discontinued; c. 12/2023 recurrent Afib->amio->TEE/DCCV.   SCC (squamous cell carcinoma) 02/10/2005   scc in situ-left forearm.   SCC (squamous cell carcinoma) 02/12/2024   L lateral upper pretibia, EDC   SCCA (squamous cell carcinoma) of skin 02/10/2005   Right Outer Eye (in situ) (curet and excision)   SCCA (squamous cell carcinoma) of skin 02/13/2006   Right Ear (well diff) (curet and 5FU)   SCCA (squamous cell carcinoma) of skin 04/05/2006   Left Ear Bowl (in situ) (tx p bx)   SCCA (squamous cell carcinoma) of skin 07/06/2010  Right Cheek (in situ) (curet and zyclara)   SCCA (squamous cell carcinoma) of skin 04/27/2009   Left Upper Forearm (in situ) (curet 5FU)   SCCA (squamous cell carcinoma) of skin 08/28/2014   Right Cheek (well diff)   SCCA (squamous cell carcinoma) of skin 03/21/2016   Left Jawline Ant (well diff) (curet and 5FU)   SCCA (squamous cell carcinoma) of skin 01/19/2017   Right Hand (in situ) (tx p bx)   SCCA (squamous cell carcinoma) of skin 03/04/1840   Left Jawline Inf (in situ) (curet and 5FU)   SCCA (squamous cell carcinoma) of skin 12/23/2020   Left Forehead (well diff) (tx p bx)   SCCA (squamous cell carcinoma) of skin  12/23/2020   Left Submandibular Area (in situ) (tx p bx)   Seasonal allergies    Spinal stenosis    Squamous cell carcinoma of skin 08/31/2020   in situ-left jawline cheek   Squamous cell carcinoma of skin 04/27/2021   infil- left nasal sidewall(MOHS)   Squamous cell carcinoma of skin 03/28/2023   Left medial cheek above nasolabial, not treated, but cleared with bx   Squamous cell carcinoma of skin 08/16/2023   Left lateral upper pretibia, EDC   Squamous cell carcinoma of skin 05/28/2024   SCC IS,  Left Upper Temple, [facility] 06/10/24   Squamous cell carcinoma of skin of face 04/27/2021   infil- Left Temporal scalp (MOHS   UTI (lower urinary tract infection)    required admission to [facility]    Past Surgical History:  Procedure Laterality Date   BACK SURGERY  2008   Laminectomy L4-5   CARDIOVERSION N/A 12/22/2023   Procedure: CARDIOVERSION;  Surgeon: Darliss Rogue, MD;  Location: [facility] ORS;  Service: Cardiovascular;  Laterality: N/A;   JOINT REPLACEMENT Bilateral    bil   KNEE ARTHROSCOPY     right   LUMBAR LAMINECTOMY/DECOMPRESSION MICRODISCECTOMY Bilateral 11/20/2018   Procedure: Bilateral Thoracic Twelve to Lumbar One Laminectomy;  Surgeon: Colon Shove, MD;  Location: [facility] OR;  Service: Neurosurgery;  Laterality: Bilateral;  posterior   TEE WITHOUT CARDIOVERSION N/A 12/22/2023   Procedure: TRANSESOPHAGEAL ECHOCARDIOGRAM (TEE);  Surgeon: Darliss Rogue, MD;  Location: [facility] ORS;  Service: Cardiovascular;  Laterality: N/A;   TOTAL HIP ARTHROPLASTY  03/20/2012   Procedure: TOTAL HIP ARTHROPLASTY ANTERIOR APPROACH;  Surgeon: Donnice JONETTA Car, MD;  Location: [facility] ORS;  Service: Orthopedics;  Laterality: Left;     reports that he has never smoked. He has never used smokeless tobacco. He reports current alcohol use. He reports that he does not use drugs.  Allergies  Allergen Reactions   Codeine Other (See Comments)    Unknown reaction   Lipitor [Atorvastatin ] Other (See Comments)     Arthralgias    Bactrim [Sulfamethoxazole-Trimethoprim] Rash    Family History  Problem Relation Age of Onset   Arthritis Mother    Lung cancer Father    Asthma Father    Colon cancer Neg Hx    Prostate cancer Neg Hx     Prior to Admission medications   Medication Sig Start Date End Date Taking? Authorizing Provider  acetaminophen  (TYLENOL ) 650 MG CR tablet Take 1,300 mg by mouth 2 (two) times daily as needed for pain.    [provider]  amoxicillin  (AMOXIL ) 500 MG capsule Take 2,000 mg by mouth once. Prior to dentist. 03/27/24   [provider]  cyanocobalamin  (VITAMIN B12) 1000 MCG/ML injection Inject 1 mL (1,000 mcg total) into the muscle every 30 (thirty)  days. 01/26/23   Cleatus Arlyss RAMAN, MD  diclofenac  Sodium (VOLTAREN ) 1 % GEL Apply 1 Application topically every 6 (six) hours as needed (pain). 09/05/16   [provider]  ELIQUIS  5 MG TABS tablet TAKE ONE TABLET BY MOUTH TWICE A DAY 02/09/24   Cleatus Arlyss RAMAN, MD  fluorouracil  (EFUDEX ) 5 % cream Apply topically 2 (two) times daily. Bid 5-7 days to temples, cheeks, forehead and nose 02/12/24   Jackquline Sawyer, MD  furosemide  (LASIX ) 20 MG tablet Take 1 tablet (20 mg total) by mouth as needed for fluid or edema. 05/31/24   Anner Alm ORN, MD  gabapentin  (NEURONTIN ) 300 MG capsule TAKE ONE CAPSULE BY MOUTH ONCE DAILY 05/02/24   Cleatus Arlyss RAMAN, MD  hydrocortisone  (PROCTOSOL HC) 2.5 % rectal cream Place 1 Application rectally 2 (two) times daily as needed for hemorrhoids or anal itching. Home med. 01/08/24   Awanda City, MD  methylcellulose (CITRUCEL) oral powder Take 1 packet by mouth daily. 07/02/24   Cleatus Arlyss RAMAN, MD  metoprolol  tartrate (LOPRESSOR ) 25 MG tablet Held as of 03/12/24 03/12/24   Cleatus Arlyss RAMAN, MD  ondansetron  (ZOFRAN ) 4 MG tablet TAKE ONE TABLET (4 MG TOTAL) BY MOUTH EVERY EIGHT (EIGHT) HOURS AS NEEDED FOR NAUSEA OR VOMITING. 11/01/23   Cleatus Arlyss RAMAN, MD  OVER THE COUNTER MEDICATION Place 1-2  sprays into the nose daily as needed (allergies, nasal congestion). Unknown nasal spray.    [provider]  tamsulosin  (FLOMAX ) 0.4 MG CAPS capsule TAKE TWO CAPSULE BY MOUTH DAILY 06/05/24   Cleatus Arlyss RAMAN, MD  triamcinolone  cream (KENALOG ) 0.1 % Apply 1-2 times daily to aa, lower legs for up to 2 weeks. Avoid applying to face, groin, and axilla. Use as directed. Long-term use can cause thinning of the skin. 06/10/24   Jackquline Sawyer, MD    Physical Exam: Vitals:   07/18/24 1330 07/18/24 1340 07/18/24 1433  BP: 136/68    Pulse: 74    Resp:  (!) 22   Temp:   99.4 F (37.4 C)  TempSrc:   Oral  SpO2: 97%      Physical Exam   Constitutional: Alert, awake, calm, comfortable HEENT: Neck supple Respiratory: Clear to auscultation B/L, no wheezing, no rales.  Cardiovascular: Regular rate and rhythm, no murmurs / rubs / gallops. No extremity edema. 2+ pedal pulses. No carotid bruits.  Abdomen: Soft, no tenderness, Bowel sounds positive.  Musculoskeletal: no clubbing / cyanosis. Good ROM, no contractures. Normal muscle tone.  Skin: no rashes, lesions, ulcers. Neurologic: CN 2-12 grossly intact. Sensation intact, No focal deficit identified Psychiatric: Alert and oriented x 3. Normal mood.    Labs on Admission: I have personally reviewed following labs and imaging studies  CBC: Recent Labs  Lab 07/18/24 1338  WBC 7.2  NEUTROABS 5.1  HGB 13.9  HCT 44.1  MCV 101.8*  PLT 184   Basic Metabolic Panel: Recent Labs  Lab 07/18/24 1338  NA 139  K 4.4  CL 103  CO2 31  GLUCOSE 101*  BUN 12  CREATININE 0.76  CALCIUM  8.5*   GFR: CrCl cannot be calculated (Unknown ideal weight.). Liver Function Tests: Recent Labs  Lab 07/18/24 1338  AST 12*  ALT 10  ALKPHOS 42  BILITOT 1.3*  PROT 6.6  ALBUMIN 3.5   No results for input(s): LIPASE, AMYLASE in the last 168 hours. No results for input(s): AMMONIA in the last 168 hours. Coagulation Profile: No results for  input(s): INR, PROTIME in  the last 168 hours. Cardiac Enzymes: Recent Labs  Lab 07/18/24 1338  TROPONINIHS 8   BNP (last 3 results) Recent Labs    12/20/23 1122 07/18/24 1338  BNP 625.5* 200.7*   HbA1C: No results for input(s): HGBA1C in the last 72 hours. CBG: No results for input(s): GLUCAP in the last 168 hours. Lipid Profile: No results for input(s): CHOL, HDL, LDLCALC, TRIG, CHOLHDL, LDLDIRECT in the last 72 hours. Thyroid  Function Tests: No results for input(s): TSH, T4TOTAL, FREET4, T3FREE, THYROIDAB in the last 72 hours. Anemia Panel: No results for input(s): VITAMINB12, FOLATE, FERRITIN, TIBC, IRON , RETICCTPCT in the last 72 hours. Urine analysis:    Component Value Date/Time   COLORURINE YELLOW (A) 07/18/2024 1338   APPEARANCEUR CLEAR (A) 07/18/2024 1338   APPEARANCEUR Hazy 06/11/2013 1519   LABSPEC 1.017 07/18/2024 1338   LABSPEC 1.015 06/11/2013 1519   PHURINE 5.0 07/18/2024 1338   GLUCOSEU NEGATIVE 07/18/2024 1338   GLUCOSEU NEGATIVE 10/31/2023 1034   HGBUR NEGATIVE 07/18/2024 1338   BILIRUBINUR NEGATIVE 07/18/2024 1338   BILIRUBINUR Negative 07/02/2024 1234   BILIRUBINUR Negative 06/11/2013 1519   KETONESUR NEGATIVE 07/18/2024 1338   PROTEINUR NEGATIVE 07/18/2024 1338   UROBILINOGEN 0.2 07/02/2024 1234   UROBILINOGEN 0.2 10/31/2023 1034   NITRITE NEGATIVE 07/18/2024 1338   LEUKOCYTESUR NEGATIVE 07/18/2024 1338   LEUKOCYTESUR 3+ 06/11/2013 1519    Radiological Exams on Admission: I have personally reviewed images DG Chest Portable 1 View Result Date: 07/18/2024 CLINICAL DATA:  Short of breath EXAM: PORTABLE CHEST 1 VIEW COMPARISON:  None Available. FINDINGS: Normal cardiac silhouette. Low lung volumes. Mild central venous congestion. No change from prior. IMPRESSION: Low lung volumes and central venous congestion. Electronically Signed   By: Jackquline Boxer M.D.   On: 07/18/2024 14:02    EKG: My personal  interpretation of EKG shows: Sinus rhythm with incomplete right bundle branch block, no ST elevation.    Assessment/Plan Principal Problem:   CHF (congestive heart failure) (HCC) Active Problems:   HTN (hypertension)   HLD (hyperlipidemia)   SCC (squamous cell carcinoma), face   Bilateral lower extremity edema   Coronary artery disease, non-occlusive   Paroxysmal atrial fibrillation/flutter (HCC)    Assessment and Plan: 83 year old male with multiple medical problems including but not limited to congestive heart failure with preserved ejection fraction, HTN, HLD, A-fib on Eliquis , nonocclusive coronary artery disease who came into hospital for tiredness, lack of energy and shortness of breath likely related to acute exacerbation of congestive heart failure.  1.  Acute hypoxemic respiratory failure may be due to combination of HFpEF and COPD/OSA - Patient was not taking any diuretics for the last few weeks as he had to go to bathroom after the diuretics. - He has a swollen leg and elevated BNP - He responded well with IV diuretics. - He also has elevated pCO2 around 72. - He is hypoxemic requiring 4 L of oxygen, initially required BiPAP - He will be admitted to the hospital as inpatient for acute hypoxic respiratory failure secondary to combination of CHF, COPD exacerbation. - He will be placed on CPAP during the night  2.  Acute exacerbation of HFpEF - He received IV Lasix  in the emergency room and improved. - His last echo was in January 2025. - Ejection fraction was normal. - Patient has a noncompliance with medications extensive counseling was done. - He will be continued on Lasix  40 mg twice a day. - Input output charting and daily weight  3.  COPD/hypercarbia/hypoxemia - Will put him on oxygen to maintain saturation more than 90% - Patient received BiPAP in the emergency room. - Will continue BiPAP during the day and CPAP during the nighttime. - I do not hear much  wheezing in his lungs at this point but I will put him on nebulizer as needed.  No steroid needed at this point  4.  Obstructive sleep apnea - Will continue on him CPAP during the night and BiPAP during the daytime  5.  Atrial fibrillation - Rate has been well-controlled - Will continue his home medications - Continue Eliquis       DVT prophylaxis: Eliquis  Code Status: Full Code no intubation Family Communication: Wife and daughter at bedside Disposition Plan: Home versus rehab Consults called: None Admission status: Inpatient, Telemetry bed   Nena Rebel, MD [facility] 07/18/2024, 4:24 PM

## 2024-07-19 ENCOUNTER — Ambulatory Visit: Admitting: Internal Medicine

## 2024-07-19 ENCOUNTER — Encounter: Payer: Self-pay | Admitting: Hospitalist

## 2024-07-19 ENCOUNTER — Inpatient Hospital Stay (HOSPITAL_COMMUNITY)
Admit: 2024-07-19 | Discharge: 2024-07-19 | Disposition: A | Discharge status: Home / Self Care | Attending: Hospitalist | Admitting: Hospitalist

## 2024-07-19 DIAGNOSIS — J9602 Acute respiratory failure with hypercapnia: Secondary | ICD-10-CM

## 2024-07-19 DIAGNOSIS — I5033 Acute on chronic diastolic (congestive) heart failure: Secondary | ICD-10-CM | POA: Diagnosis not present

## 2024-07-19 DIAGNOSIS — I10 Essential (primary) hypertension: Secondary | ICD-10-CM

## 2024-07-19 DIAGNOSIS — E876 Hypokalemia: Secondary | ICD-10-CM | POA: Insufficient documentation

## 2024-07-19 DIAGNOSIS — E669 Obesity, unspecified: Secondary | ICD-10-CM | POA: Insufficient documentation

## 2024-07-19 DIAGNOSIS — C44320 Squamous cell carcinoma of skin of unspecified parts of face: Secondary | ICD-10-CM

## 2024-07-19 DIAGNOSIS — J9601 Acute respiratory failure with hypoxia: Secondary | ICD-10-CM | POA: Diagnosis not present

## 2024-07-19 DIAGNOSIS — I5031 Acute diastolic (congestive) heart failure: Secondary | ICD-10-CM

## 2024-07-19 DIAGNOSIS — I480 Paroxysmal atrial fibrillation: Secondary | ICD-10-CM

## 2024-07-19 DIAGNOSIS — R600 Localized edema: Secondary | ICD-10-CM | POA: Diagnosis not present

## 2024-07-19 LAB — COMPREHENSIVE METABOLIC PANEL WITH GFR
ALT: 9 U/L (ref 0–44)
AST: 12 U/L — ABNORMAL LOW (ref 15–41)
Albumin: 3.3 g/dL — ABNORMAL LOW (ref 3.5–5.0)
Alkaline Phosphatase: 36 U/L — ABNORMAL LOW (ref 38–126)
Anion gap: 10 (ref 5–15)
BUN: 13 mg/dL (ref 8–23)
CO2: 34 mmol/L — ABNORMAL HIGH (ref 22–32)
Calcium: 8.4 mg/dL — ABNORMAL LOW (ref 8.9–10.3)
Chloride: 98 mmol/L (ref 98–111)
Creatinine, Ser: 0.92 mg/dL (ref 0.61–1.24)
GFR, Estimated: >60 mL/min (ref >60)
Glucose, Bld: 104 mg/dL — ABNORMAL HIGH (ref 70–99)
Potassium: 3.4 mmol/L — ABNORMAL LOW (ref 3.5–5.1)
Sodium: 142 mmol/L (ref 135–145)
Total Bilirubin: 1.8 mg/dL — ABNORMAL HIGH (ref 0.0–1.2)
Total Protein: 6.1 g/dL — ABNORMAL LOW (ref 6.5–8.1)

## 2024-07-19 LAB — ECHOCARDIOGRAM COMPLETE
AR max vel: 1.72 cm2
AV Area VTI: 1.77 cm2
AV Area mean vel: 1.71 cm2
AV Mean grad: 9.8 mmHg
AV Peak grad: 18.2 mmHg
Ao pk vel: 2.14 m/s
Area-P 1/2: 3.27 cm2
S' Lateral: 2.9 cm
Weight: 3840 [oz_av]

## 2024-07-19 LAB — CBC
HCT: 42.1 % (ref 39.0–52.0)
Hemoglobin: 13.1 g/dL (ref 13.0–17.0)
MCH: 31.6 pg (ref 26.0–34.0)
MCHC: 31.1 g/dL (ref 30.0–36.0)
MCV: 101.7 fL — ABNORMAL HIGH (ref 80.0–100.0)
Platelets: 189 K/uL (ref 150–400)
RBC: 4.14 MIL/uL — ABNORMAL LOW (ref 4.22–5.81)
RDW: 12.5 % (ref 11.5–15.5)
WBC: 7.4 K/uL (ref 4.0–10.5)
nRBC: 0 % (ref 0.0–0.2)

## 2024-07-19 LAB — PROTIME-INR
INR: 1.3 — ABNORMAL HIGH (ref 0.8–1.2)
Prothrombin Time: 16.8 s — ABNORMAL HIGH (ref 11.4–15.2)

## 2024-07-19 MED ORDER — DICLOFENAC SODIUM 1 % EX GEL: 1.0000 | Freq: Four times a day (QID) | CUTANEOUS | Status: DC | PRN

## 2024-07-19 MED ORDER — PERFLUTREN LIPID MICROSPHERE
1.0000 mL | INTRAVENOUS | Status: AC | PRN
  Administered 2024-07-19: 3 mL via INTRAVENOUS

## 2024-07-19 MED ORDER — ONDANSETRON HCL 4 MG PO TABS: 4.0000 mg | ORAL_TABLET | Freq: Three times a day (TID) | ORAL | Status: DC | PRN

## 2024-07-19 MED ORDER — POTASSIUM CHLORIDE CRYS ER 20 MEQ PO TBCR
20.0000 meq | EXTENDED_RELEASE_TABLET | Freq: Two times a day (BID) | ORAL | Status: DC
  Administered 2024-07-19 – 2024-07-22 (×7): 20 meq via ORAL
  Filled 2024-07-19 (×7): qty 1

## 2024-07-19 MED ORDER — PSYLLIUM 95 % PO PACK
1.0000 | PACK | Freq: Every day | ORAL | Status: DC
  Administered 2024-07-19 – 2024-07-20 (×2): 1 via ORAL
  Filled 2024-07-19 (×5): qty 1

## 2024-07-19 NOTE — Assessment & Plan Note (Signed)
 On Eliquis for anticoagulation and metoprolol for rate control.

## 2024-07-19 NOTE — ED Notes (Signed)
 This RN was informed by EVS that pts wife had poured urine into the floor because her husband was peeing a lot and there was no where to empty it and that she was going to start pouring it down the stink. This RN went into room and explained to pt and his wife that there is a restroom across the hall and if they called out when the urinal needed to be emptied we would empty the urinal for her but that it is not appropriate to pour the urine in the floor or down the sink. Pt and his wife verbalized understanding.

## 2024-07-19 NOTE — Hospital Course (Addendum)
 83 y.o. male with medical history significant for morbid obesity, HFpEF, CAD nonocclusive, A-fib on Eliquis , obstructive sleep apnea not on CPAP regularly, HTN, HLD who presented to ED at [facility] from [facility] independent living center for body aches, pains, low energy all day yesterday and tired when getting up this morning with shortness of breath.  Patient oxygen was checked which was 70 on room air on arrival of EMS.  He was placed on oxygen.  He also reports his legs are swollen.  He has not been using diuretics as he had to go to bathroom multiple times. He denies any fever chills, nausea vomiting, chest pain, palpitations.   ED Course: Upon arrival to the ED, patient is found to be fluid overloaded, hypoxemic, bilateral leg swelling, with CO2 retention of 72.  His COVID and flu were negative.  Chest x-ray showed no pneumonia but some congestion.  His troponins were negative.  Patient was given Lasix , placed him on BiPAP, oxygen and hospitalist service was consulted for evaluation for admission.  8/15.  Patient states that he has not been taking his water pill at home.  He states his water pill is as needed at home.  Coming in with shortness of breath.  Patient states he cannot use the BiPAP.  8/16: Patient agrees to retry BiPAP at night.  Still feeling little fatigued and somnolent.

## 2024-07-19 NOTE — Plan of Care (Signed)
  Problem: Education: Goal: Knowledge of General Education information will improve Description: Including pain rating scale, medication(s)/side effects and non-pharmacologic comfort measures Outcome: Progressing   Problem: Health Behavior/Discharge Planning: Goal: Ability to manage health-related needs will improve Outcome: Progressing   Problem: Clinical Measurements: Goal: Ability to maintain clinical measurements within normal limits will improve Outcome: Progressing Goal: Will remain free from infection Outcome: Progressing Goal: Diagnostic test results will improve Outcome: Progressing Goal: Respiratory complications will improve Outcome: Progressing Goal: Cardiovascular complication will be avoided Outcome: Progressing   Problem: Nutrition: Goal: Adequate nutrition will be maintained Outcome: Progressing   Problem: Coping: Goal: Level of anxiety will decrease Outcome: Progressing   Problem: Elimination: Goal: Will not experience complications related to bowel motility Outcome: Progressing Goal: Will not experience complications related to urinary retention Outcome: Progressing   Problem: Pain Managment: Goal: General experience of comfort will improve and/or be controlled Outcome: Progressing   Problem: Education: Goal: Ability to demonstrate management of disease process will improve Outcome: Progressing Goal: Ability to verbalize understanding of medication therapies will improve Outcome: Progressing Goal: Individualized Educational Video(s) Outcome: Progressing   Problem: Skin Integrity: Goal: Risk for impaired skin integrity will decrease Outcome: Progressing   Problem: Activity: Goal: Capacity to carry out activities will improve Outcome: Progressing   Problem: Cardiac: Goal: Ability to achieve and maintain adequate cardiopulmonary perfusion will improve Outcome: Progressing

## 2024-07-19 NOTE — Assessment & Plan Note (Signed)
 Blood pressure stable on metoprolol .

## 2024-07-19 NOTE — Assessment & Plan Note (Signed)
 Today's BMI 34.44 class I obesity.

## 2024-07-19 NOTE — Plan of Care (Signed)
  Problem: Education: Goal: Knowledge of General Education information will improve Description: Including pain rating scale, medication(s)/side effects and non-pharmacologic comfort measures Outcome: Progressing   Problem: Health Behavior/Discharge Planning: Goal: Ability to manage health-related needs will improve Outcome: Progressing   Problem: Clinical Measurements: Goal: Ability to maintain clinical measurements within normal limits will improve Outcome: Progressing Goal: Will remain free from infection Outcome: Progressing Goal: Diagnostic test results will improve Outcome: Progressing Goal: Cardiovascular complication will be avoided Outcome: Progressing   Problem: Activity: Goal: Risk for activity intolerance will decrease Outcome: Progressing   Problem: Nutrition: Goal: Adequate nutrition will be maintained Outcome: Progressing   Problem: Coping: Goal: Level of anxiety will decrease Outcome: Progressing   Problem: Elimination: Goal: Will not experience complications related to bowel motility Outcome: Progressing Goal: Will not experience complications related to urinary retention Outcome: Progressing   Problem: Pain Managment: Goal: General experience of comfort will improve and/or be controlled Outcome: Progressing   Problem: Safety: Goal: Ability to remain free from injury will improve Outcome: Progressing   Problem: Skin Integrity: Goal: Risk for impaired skin integrity will decrease Outcome: Progressing   Problem: Education: Goal: Ability to demonstrate management of disease process will improve Outcome: Progressing Goal: Ability to verbalize understanding of medication therapies will improve Outcome: Progressing Goal: Individualized Educational Video(s) Outcome: Progressing   Problem: Activity: Goal: Capacity to carry out activities will improve Outcome: Progressing   Problem: Cardiac: Goal: Ability to achieve and maintain adequate  cardiopulmonary perfusion will improve Outcome: Progressing   Problem: Clinical Measurements: Goal: Respiratory complications will improve Outcome: Not Progressing

## 2024-07-19 NOTE — Assessment & Plan Note (Addendum)
 Patient ordered BiPAP on admission for elevated PCO2 of 72.  Patient was unable to tolerate BiPAP.  Started on IV Lasix .  Patient was hypoxic to 70% as per ER physician note requiring 4 L initially.   Currently on 2 L of oxygen. Agreed to retry BiPAP at night-patient was given BiPAP at home and had an appointment with his pulmonologist to try different kind of mask. - Try BiPAP at night

## 2024-07-19 NOTE — Assessment & Plan Note (Signed)
 Replace potassium

## 2024-07-19 NOTE — Progress Notes (Signed)
 Progress Note   Patient: Todd Gonzales FMW:[id number] DOB: 07-22-1941 DOA: 07/18/2024     1 DOS: the patient was seen and examined on 07/19/2024   Brief hospital course: 83 y.o. male with medical history significant for morbid obesity, HFpEF, CAD nonocclusive, A-fib on Eliquis , obstructive sleep apnea not on CPAP regularly, HTN, HLD who presented to ED at [facility] from [facility] independent living center for body aches, pains, low energy all day yesterday and tired when getting up this morning with shortness of breath.  Patient oxygen was checked which was 70 on room air on arrival of EMS.  He was placed on oxygen.  He also reports his legs are swollen.  He has not been using diuretics as he had to go to bathroom multiple times. He denies any fever chills, nausea vomiting, chest pain, palpitations.   ED Course: Upon arrival to the ED, patient is found to be fluid overloaded, hypoxemic, bilateral leg swelling, with CO2 retention of 72.  His COVID and flu were negative.  Chest x-ray showed no pneumonia but some congestion.  His troponins were negative.  Patient was given Lasix , placed him on BiPAP, oxygen and hospitalist service was consulted for evaluation for admission.  8/15.  Patient states that he has not been taking his water pill at home.  He states his water pill is as needed at home.  Coming in with shortness of breath.  Patient states he cannot use the BiPAP.  Assessment and Plan: * Acute respiratory failure with hypoxia and hypercapnia (HCC) Patient ordered BiPAP on admission for elevated PCO2 of 72.  Patient was unable to tolerate BiPAP.  Started on IV Lasix .  Patient was hypoxic to 70% as per ER physician note requiring 4 L initially.  This morning had a pulse ox of 92% on room air.  Will get overnight oximetry to see if he qualifies for nocturnal oxygen since unable to wear BiPAP.  Acute on chronic diastolic CHF (congestive heart failure) (HCC) Continue Lasix  40 mg IV twice daily.  Last  echocardiogram showed an EF that was normal.  Hypokalemia Replace potassium  Obesity (BMI 30-39.9) Today's BMI 34.44 class I obesity.  Paroxysmal atrial fibrillation/flutter (HCC) On Eliquis  for anticoagulation and metoprolol  for rate control  SCC (squamous cell carcinoma), face Follow-up as outpatient  HTN (hypertension) Blood pressure stable on metoprolol .        Subjective: Patient coming in with shortness of breath.  Feeling a little bit better.  Leg swelling is down.  Admitted with congestive heart failure and acute respiratory failure.  Physical Exam: Vitals:   07/19/24 0632 07/19/24 0700 07/19/24 0730 07/19/24 0940  BP:    122/72  Pulse:  67 65 64  Resp:    17  Temp: 98.3 F (36.8 C)   98.3 F (36.8 C)  TempSrc:    Oral  SpO2:  (!) 89% (!) 88% 92%  Weight:       Physical Exam HENT:     Head: Normocephalic.  Eyes:     General: Lids are normal.     Conjunctiva/sclera: Conjunctivae normal.  Cardiovascular:     Rate and Rhythm: Normal rate and regular rhythm.     Heart sounds: Normal heart sounds, S1 normal and S2 normal.  Pulmonary:     Breath sounds: Examination of the right-lower field reveals decreased breath sounds. Examination of the left-lower field reveals decreased breath sounds. Decreased breath sounds present. No wheezing, rhonchi or rales.  Abdominal:  Palpations: Abdomen is soft.     Tenderness: There is no abdominal tenderness.  Musculoskeletal:     Right lower leg: Swelling present.     Left lower leg: Swelling present.  Skin:    General: Skin is warm.     Comments: Areas of redness over face and legs and arms.  Neurological:     Mental Status: He is alert and oriented to person, place, and time.     Data Reviewed: Potassium 3.4, total bilirubin 1.8, hemoglobin 13.1, platelet count 189, white blood cell count 7.4 Chest x-ray showing low lung volumes with central venous congestion VBG showing a pH of 7.31, PCO2 of 72 Family  Communication: Left message for wife  Disposition: Status is: Inpatient Remains inpatient appropriate because: Continue IV diuresis  Planned Discharge Destination: Home    Time spent: 28 minutes  Author: Charlie Patterson, MD 07/19/2024 11:48 AM  For on call review [email or web address].

## 2024-07-19 NOTE — Assessment & Plan Note (Signed)
 Follow up as outpatient

## 2024-07-19 NOTE — TOC CM/SW Note (Signed)
..  Transition of Care [facility]) - Inpatient Brief Assessment   Patient Details  Name: Todd Gonzales MRN: [id number] Date of Birth: [birth year]  [facility] CM/SW Contact:    Edsel DELENA Fischer, LCSW Phone Number: 07/19/2024, 10:40 AM   Clinical Narrative:   SW to handoff to heart failure team to follow up with pt.  If additional needs are present, sw to address  Transition of Care Asessment:

## 2024-07-19 NOTE — Evaluation (Signed)
 Physical Therapy Evaluation Patient Details Name: Todd Gonzales MRN: [id number] DOB: 1941-04-14 Today's Date: 07/19/2024  History of Present Illness  Pt is a pleasant 83 y.o. male with medical history significant for morbid obesity, HFpEF, CAD nonocclusive, A-fib on Eliquis , obstructive sleep apnea not on CPAP regularly, HTN, HLD who presented to ED at [facility] from [facility] independent living center for body aches, pains, low energy with shortness of breath.  Patient oxygen was checked which was 70 on room air on arrival of EMS.  MD assessment includes: acute hypoxemic respiratory failure may be due to combination of HFpEF and COPD/OSA, acute exacerbation of HFpEF.   Clinical Impression  Pt was pleasant and motivated to participate during the session and put forth good effort throughout. Pt required no physical assist during the session needing only extra time and effort with bed mobility tasks and cues for general sequencing during amb with the RW for improved safety.  Pt was generally steady with gait with no overt LOB and reported no adverse symptoms during the session with SpO2 and HR WNL throughout on room air.  Pt will benefit from continued PT services upon discharge to safely address deficits listed in patient problem list for decreased caregiver assistance and eventual return to PLOF.           If plan is discharge home, recommend the following: A little help with walking and/or transfers;A little help with bathing/dressing/bathroom;Assistance with cooking/housework;Assist for transportation   Can travel by private vehicle        Equipment Recommendations None recommended by PT  Recommendations for Other Services       Functional Status Assessment Patient has had a recent decline in their functional status and demonstrates the ability to make significant improvements in function in a reasonable and predictable amount of time.     Precautions / Restrictions  Precautions Precautions: Fall Restrictions Weight Bearing Restrictions Per Provider Order: No      Mobility  Bed Mobility Overal bed mobility: Modified Independent             General bed mobility comments: Min extra time and effort with use of rails    Transfers Overall transfer level: Needs assistance Equipment used: Rolling walker (2 wheels) Transfers: Sit to/from Stand Sit to Stand: Supervision, From elevated surface           General transfer comment: Good control and stability without the need of UE assist    Ambulation/Gait Ambulation/Gait assistance: Supervision Gait Distance (Feet): 100 Feet Assistive device: Rolling walker (2 wheels) Gait Pattern/deviations: Step-through pattern, Decreased step length - right, Decreased step length - left, Trunk flexed Gait velocity: decreased     General Gait Details: Min verbal cues for amb closer to the RW with upright posture with pt steady with no overt LOB including during start/stops and 180 deg turns  Careers information officer     Tilt Bed    Modified Rankin (Stroke Patients Only)       Balance Overall balance assessment: Needs assistance   Sitting balance-Leahy Scale: Normal     Standing balance support: Bilateral upper extremity supported, During functional activity Standing balance-Leahy Scale: Good                               Pertinent Vitals/Pain Pain Assessment Pain Assessment: No/denies pain    Home Living Family/patient expects to  be discharged to:: Private residence Living Arrangements: Spouse/significant other Available Help at Discharge: Family;Available 24 hours/day Type of Home: Independent living facility Home Access: Level entry       Home Layout: One level Home Equipment: Agricultural consultant (2 wheels);Cane - single point;Grab bars - tub/shower;Hand held shower head;Hospital bed;Shower seat;Electric scooter Additional Comments: Lives with spouse  at [facility] independent living    Prior Function Prior Level of Function : Independent/Modified Independent             Mobility Comments: Ind amb in the home without an AD but with use of a RW over the last week, uses electric scooter for facility access ADLs Comments: Ind with ADLs     Extremity/Trunk Assessment   Upper Extremity Assessment Upper Extremity Assessment: Generalized weakness    Lower Extremity Assessment Lower Extremity Assessment: Generalized weakness       Communication   Communication Communication: No apparent difficulties    Cognition Arousal: Alert Behavior During Therapy: WFL for tasks assessed/performed   PT - Cognitive impairments: No apparent impairments                         Following commands: Intact       Cueing Cueing Techniques: Verbal cues     General Comments      Exercises     Assessment/Plan    PT Assessment Patient needs continued PT services  PT Problem List Decreased strength;Decreased activity tolerance;Decreased balance;Decreased mobility;Decreased knowledge of use of DME       PT Treatment Interventions DME instruction;Gait training;Functional mobility training;Therapeutic activities;Therapeutic exercise;Balance training;Patient/family education    PT Goals (Current goals can be found in the Care Plan section)  Acute Rehab PT Goals Patient Stated Goal: To return home PT Goal Formulation: With patient Time For Goal Achievement: 08/01/24 Potential to Achieve Goals: Good    Frequency Min 2X/week     Co-evaluation               AM-PAC PT 6 Clicks Mobility  Outcome Measure Help needed turning from your back to your side while in a flat bed without using bedrails?: A Little Help needed moving from lying on your back to sitting on the side of a flat bed without using bedrails?: A Little Help needed moving to and from a bed to a chair (including a wheelchair)?: A Little Help needed  standing up from a chair using your arms (e.g., wheelchair or bedside chair)?: A Little Help needed to walk in hospital room?: A Little Help needed climbing 3-5 steps with a railing? : A Lot 6 Click Score: 17    End of Session Equipment Utilized During Treatment: Gait belt Activity Tolerance: Patient tolerated treatment well Patient left: in bed;with call bell/phone within reach;with bed alarm set;with family/visitor present Nurse Communication: Mobility status;Other (comment) (Several telemetry leads/stickers no longer attached) PT Visit Diagnosis: Difficulty in walking, not elsewhere classified (R26.2);Muscle weakness (generalized) (M62.81)    Time: 8943-8871 PT Time Calculation (min) (ACUTE ONLY): 32 min   Charges:   PT Evaluation $PT Eval Moderate Complexity: 1 Mod PT Treatments $Gait Training: 8-22 mins PT General Charges $$ ACUTE PT VISIT: 1 Visit       D. Glendia Bertin PT, DPT 07/19/24, 11:47 AM

## 2024-07-19 NOTE — Progress Notes (Signed)
 Patients family requesting citrucel medication to be added to [facility]. Patient takes nightly at home. MD [place]iet[place] paged.

## 2024-07-19 NOTE — Assessment & Plan Note (Addendum)
 Continue Lasix  40 mg IV twice daily.  Last echocardiogram showed an EF that was normal. Renal functions with improvement in creatinine -Daily weight and BMP -Strict intake and output

## 2024-07-20 DIAGNOSIS — J9601 Acute respiratory failure with hypoxia: Secondary | ICD-10-CM | POA: Diagnosis not present

## 2024-07-20 DIAGNOSIS — I2510 Atherosclerotic heart disease of native coronary artery without angina pectoris: Secondary | ICD-10-CM | POA: Diagnosis not present

## 2024-07-20 DIAGNOSIS — E785 Hyperlipidemia, unspecified: Secondary | ICD-10-CM

## 2024-07-20 DIAGNOSIS — R600 Localized edema: Secondary | ICD-10-CM | POA: Diagnosis not present

## 2024-07-20 DIAGNOSIS — I5033 Acute on chronic diastolic (congestive) heart failure: Secondary | ICD-10-CM | POA: Diagnosis not present

## 2024-07-20 LAB — BASIC METABOLIC PANEL WITH GFR
Anion gap: 13 (ref 5–15)
BUN: 17 mg/dL (ref 8–23)
CO2: 33 mmol/L — ABNORMAL HIGH (ref 22–32)
Calcium: 8.7 mg/dL — ABNORMAL LOW (ref 8.9–10.3)
Chloride: 94 mmol/L — ABNORMAL LOW (ref 98–111)
Creatinine, Ser: 0.87 mg/dL (ref 0.61–1.24)
GFR, Estimated: >60 mL/min (ref >60)
Glucose, Bld: 102 mg/dL — ABNORMAL HIGH (ref 70–99)
Potassium: 3.4 mmol/L — ABNORMAL LOW (ref 3.5–5.1)
Sodium: 140 mmol/L (ref 135–145)

## 2024-07-20 NOTE — Progress Notes (Signed)
 Progress Note   Patient: Todd Gonzales DOB: 26-Jan-1941 DOA: 07/18/2024     2 DOS: the patient was seen and examined on 07/20/2024   Brief hospital course: 83 y.o. male with medical history significant for morbid obesity, HFpEF, CAD nonocclusive, A-fib on Eliquis , obstructive sleep apnea not on CPAP regularly, HTN, HLD who presented to ED at [facility] from [facility] independent living center for body aches, pains, low energy all day yesterday and tired when getting up this morning with shortness of breath.  Patient oxygen was checked which was 70 on room air on arrival of EMS.  He was placed on oxygen.  He also reports his legs are swollen.  He has not been using diuretics as he had to go to bathroom multiple times. He denies any fever chills, nausea vomiting, chest pain, palpitations.   ED Course: Upon arrival to the ED, patient is found to be fluid overloaded, hypoxemic, bilateral leg swelling, with CO2 retention of 72.  His COVID and flu were negative.  Chest x-ray showed no pneumonia but some congestion.  His troponins were negative.  Patient was given Lasix , placed him on BiPAP, oxygen and hospitalist service was consulted for evaluation for admission.  8/15.  Patient states that he has not been taking his water pill at home.  He states his water pill is as needed at home.  Coming in with shortness of breath.  Patient states he cannot use the BiPAP.  8/16: Patient agrees to retry BiPAP at night.  Still feeling little fatigued and somnolent.  Assessment and Plan: * Acute respiratory failure with hypoxia and hypercapnia (HCC) Patient ordered BiPAP on admission for elevated PCO2 of 72.  Patient was unable to tolerate BiPAP.  Started on IV Lasix .  Patient was hypoxic to 70% as per ER physician note requiring 4 L initially.   Currently on 2 L of oxygen. Agreed to retry BiPAP at night-patient was given BiPAP at home and had an appointment with his pulmonologist to try different kind of  mask. - Try BiPAP at night  Acute on chronic diastolic CHF (congestive heart failure) (HCC) Continue Lasix  40 mg IV twice daily.  Last echocardiogram showed an EF that was normal. Renal functions with improvement in creatinine -Daily weight and BMP -Strict intake and output  Hypokalemia Replace potassium  Obesity (BMI 30-39.9) Today's BMI 34.44 class I obesity.  Paroxysmal atrial fibrillation/flutter (HCC) On Eliquis  for anticoagulation and metoprolol  for rate control  SCC (squamous cell carcinoma), face Follow-up as outpatient  HTN (hypertension) Blood pressure stable on metoprolol .   Subjective: Patient was sitting comfortably in chair when seen today.  Per patient he was having difficulty tolerating mask but he is a mouth breather.  Had a lengthy discussion with patient and wife and he agrees to try BiPAP tonight.  Physical Exam: Vitals:   07/20/24 1056 07/20/24 1100 07/20/24 1102 07/20/24 1522  BP:    111/63  Pulse:    65  Resp:    16  Temp:    97.9 F (36.6 C)  TempSrc:      SpO2: 92% 90% 97% 98%  Weight:      Height:       General.  Obese elderly man, in no acute distress. Pulmonary.  Few basal crackles bilaterally, normal respiratory effort. CV.  Regular rate and rhythm, no JVD, rub or murmur. Abdomen.  Soft, nontender, nondistended, BS positive. CNS.  Alert and oriented .  No focal neurologic deficit. Extremities.  Trace  LE edema,  pulses intact and symmetrical. Psychiatry.  Judgment and insight appears normal.    Data Reviewed: Prior data reviewed  Family Communication: Discussed with wife at bedside  Disposition: Status is: Inpatient Remains inpatient appropriate because: Continue IV diuresis  Planned Discharge Destination: Home  DVT prophylaxis Eliquis  Time spent: 45 minutes  This record has been created using Conservation officer, historic buildings. Errors have been sought and corrected,but may not always be located. Such creation errors do not  reflect on the standard of care.   Author: Amaryllis Dare, MD 07/20/2024 4:13 PM  For on call review [email or web address].

## 2024-07-20 NOTE — Plan of Care (Signed)

## 2024-07-21 DIAGNOSIS — J9601 Acute respiratory failure with hypoxia: Secondary | ICD-10-CM | POA: Diagnosis not present

## 2024-07-21 DIAGNOSIS — J9602 Acute respiratory failure with hypercapnia: Secondary | ICD-10-CM | POA: Diagnosis not present

## 2024-07-21 LAB — BASIC METABOLIC PANEL WITH GFR
Anion gap: 10 (ref 5–15)
BUN: 28 mg/dL — ABNORMAL HIGH (ref 8–23)
CO2: 33 mmol/L — ABNORMAL HIGH (ref 22–32)
Calcium: 8.4 mg/dL — ABNORMAL LOW (ref 8.9–10.3)
Chloride: 97 mmol/L — ABNORMAL LOW (ref 98–111)
Creatinine, Ser: 1.02 mg/dL (ref 0.61–1.24)
GFR, Estimated: >60 mL/min (ref >60)
Glucose, Bld: 109 mg/dL — ABNORMAL HIGH (ref 70–99)
Potassium: 3.5 mmol/L (ref 3.5–5.1)
Sodium: 140 mmol/L (ref 135–145)

## 2024-07-21 LAB — MAGNESIUM: Magnesium: 2.2 mg/dL (ref 1.7–2.4)

## 2024-07-21 LAB — BLOOD GAS, ARTERIAL
Acid-Base Excess: 13.4 mmol/L — ABNORMAL HIGH (ref 0.0–2.0)
Bicarbonate: 39 mmol/L — ABNORMAL HIGH (ref 20.0–28.0)
FIO2: 0.21 %
O2 Saturation: 95.7 %
Patient temperature: 37
pCO2 arterial: 50 mmHg — ABNORMAL HIGH (ref 32–48)
pH, Arterial: 7.5 — ABNORMAL HIGH (ref 7.35–7.45)
pO2, Arterial: 66 mmHg — ABNORMAL LOW (ref 83–108)

## 2024-07-21 MED ORDER — FUROSEMIDE 20 MG PO TABS
20.0000 mg | ORAL_TABLET | Freq: Every day | ORAL | Status: DC
  Administered 2024-07-22: 20 mg via ORAL
  Filled 2024-07-21: qty 1

## 2024-07-21 NOTE — Progress Notes (Signed)
 Progress Note    Todd Gonzales  FMW:[id number] DOB: 1941/03/24  DOA: 07/18/2024 PCP: Cleatus Arlyss RAMAN, MD      Brief Narrative:    Medical records reviewed and are as summarized below:  Todd Gonzales is a 83 y.o. male for morbid obesity, HFpEF, CAD nonocclusive, A-fib on Eliquis , obstructive sleep apnea not on CPAP regularly, HTN, HLD who presented to ED at [facility] from [facility] independent living center because of bodyaches, low energy, increasing leg swelling and shortness of breath.  He has not been using his diuretics because of frequent urination.  Oxygen saturation was 70% on room air when EMS arrived.  He was placed on oxygen and transported to the ED.   ED Course: Upon arrival to the ED, patient is found to be fluid overloaded, hypoxemic, bilateral leg swelling, with CO2 retention of 72.  His COVID and flu were negative.  Chest x-ray showed no pneumonia but some congestion.  His troponins were negative.  Patient was given Lasix , placed him on BiPAP, oxygen and hospitalist service was consulted for evaluation for admission.      Assessment/Plan:   Principal Problem:   Acute respiratory failure with hypoxia and hypercapnia (HCC) Active Problems:   Acute on chronic diastolic CHF (congestive heart failure) (HCC)   HTN (hypertension)   HLD (hyperlipidemia)   SCC (squamous cell carcinoma), face   Bilateral lower extremity edema   Coronary artery disease, non-occlusive   Paroxysmal atrial fibrillation/flutter (HCC)   Obesity (BMI 30-39.9)   Hypokalemia   Body mass index is 34.26 kg/m.   Acute hypoxic and hypercapnic respiratory failure, chronic hypercapnic respiratory failure, OSA: Improved.  He is requiring less oxygen.  He said he used CPAP last night and it really helped.  He has not been using CPAP at home because the mask is uncomfortable.  He is working with his pulmonologist to get a new mask. Post oximetry with ambulation. Obtain baseline ABG.   Acute on  chronic diastolic CHF: Improving.  Continue IV Lasix .  Monitor BMP, daily weights and urine output. 2D echo on 07/19/2024 showed EF estimated at 60 to 65%, grade 1 diastolic dysfunction   Hypokalemia: Improved.  Continue potassium repletion   Paroxysmal atrial fibrillation: Continue Eliquis  and metoprolol    History of squamous cell carcinoma: Outpatient follow-up   Comorbidities include CAD, hypertension, hyperlipidemia         Diet Order             Diet Heart Room service appropriate? Yes; Fluid consistency: Thin  Diet effective now                                  Consultants: None  Procedures: None    Medications:    apixaban   5 mg Oral BID   cyanocobalamin   1,000 mcg Intramuscular Q30 days   furosemide   40 mg Intravenous BID   gabapentin   300 mg Oral Daily   metoprolol  tartrate  25 mg Oral BID   potassium chloride   20 mEq Oral BID   psyllium  1 packet Oral Daily   tamsulosin   0.4 mg Oral QPC breakfast   Continuous Infusions:   Anti-infectives (From admission, onward)    None              Family Communication/Anticipated D/C date and plan/Code Status   DVT prophylaxis: SCDs Start: 07/18/24 1535 apixaban  (ELIQUIS ) tablet 5 mg  Code Status: Full Code  Family Communication: None Disposition Plan: Plan to discharge home   Status is: Inpatient Remains inpatient appropriate because: CHF exacerbation       Subjective:   Interval events noted.  He has no complaints.  He said he slept well last night with CPAP.  Objective:    Vitals:   07/21/24 0500 07/21/24 0511 07/21/24 0824 07/21/24 1159  BP:  (!) 126/55 120/67 106/63  Pulse:  (!) 57 61 61  Resp:  19 19 20   Temp:  97.8 F (36.6 C) 98.3 F (36.8 C) 98 F (36.7 C)  TempSrc:      SpO2:  99% 98% 97%  Weight: 108.3 kg     Height:       No data found.   Intake/Output Summary (Last 24 hours) at 07/21/2024 1338 Last data filed at 07/21/2024  0900 Gross per 24 hour  Intake 680 ml  Output 700 ml  Net -20 ml   Filed Weights   07/19/24 1510 07/20/24 0500 07/21/24 0500  Weight: 107.8 kg 107.7 kg 108.3 kg    Exam:  GEN: NAD SKIN: Warm and dry EYES: No pallor or icterus ENT: MMM CV: RRR PULM: Bibasilar rales.  No wheezing ABD: soft, obese, NT, +BS CNS: AAO x 3, non focal EXT: Lateral leg edema is improving        Data Reviewed:   I have personally reviewed following labs and imaging studies:  Labs: Labs show the following:   Basic Metabolic Panel: Recent Labs  Lab 07/18/24 1338 07/19/24 0413 07/20/24 0410 07/21/24 0559  NA 139 142 140 140  K 4.4 3.4* 3.4* 3.5  CL 103 98 94* 97*  CO2 31 34* 33* 33*  GLUCOSE 101* 104* 102* 109*  BUN 12 13 17  28*  CREATININE 0.76 0.92 0.87 1.02  CALCIUM  8.5* 8.4* 8.7* 8.4*   GFR Estimated Creatinine Clearance: 65.2 mL/min (by C-G formula based on SCr of 1.02 mg/dL). Liver Function Tests: Recent Labs  Lab 07/18/24 1338 07/19/24 0413  AST 12* 12*  ALT 10 9  ALKPHOS 42 36*  BILITOT 1.3* 1.8*  PROT 6.6 6.1*  ALBUMIN 3.5 3.3*   No results for input(s): LIPASE, AMYLASE in the last 168 hours. No results for input(s): AMMONIA in the last 168 hours. Coagulation profile Recent Labs  Lab 07/19/24 0413  INR 1.3*    CBC: Recent Labs  Lab 07/18/24 1338 07/19/24 0413  WBC 7.2 7.4  NEUTROABS 5.1  --   HGB 13.9 13.1  HCT 44.1 42.1  MCV 101.8* 101.7*  PLT 184 189   Cardiac Enzymes: No results for input(s): CKTOTAL, CKMB, CKMBINDEX, TROPONINI in the last 168 hours. BNP (last 3 results) No results for input(s): PROBNP in the last 8760 hours. CBG: No results for input(s): GLUCAP in the last 168 hours. D-Dimer: No results for input(s): DDIMER in the last 72 hours. Hgb A1c: No results for input(s): HGBA1C in the last 72 hours. Lipid Profile: No results for input(s): CHOL, HDL, LDLCALC, TRIG, CHOLHDL, LDLDIRECT in the last  72 hours. Thyroid  function studies: No results for input(s): TSH, T4TOTAL, T3FREE, THYROIDAB in the last 72 hours.  Invalid input(s): FREET3 Anemia work up: No results for input(s): VITAMINB12, FOLATE, FERRITIN, TIBC, IRON , RETICCTPCT in the last 72 hours. Sepsis Labs: Recent Labs  Lab 07/18/24 1338 07/18/24 2216 07/19/24 0413  WBC 7.2  --  7.4  LATICACIDVEN 0.6 1.3  --     Microbiology Recent Results (from the past  240 hours)  Resp panel by RT-PCR (RSV, Flu A&B, Covid) Anterior Nasal Swab     Status: None   Collection Time: 07/18/24  1:38 PM   Specimen: Anterior Nasal Swab  Result Value Ref Range Status   SARS Coronavirus 2 by RT PCR NEGATIVE NEGATIVE Final    Comment: (NOTE) SARS-CoV-2 target nucleic acids are NOT DETECTED.  The SARS-CoV-2 RNA is generally detectable in upper respiratory specimens during the acute phase of infection. The lowest concentration of SARS-CoV-2 viral copies this assay can detect is 138 copies/mL. A negative result does not preclude SARS-Cov-2 infection and should not be used as the sole basis for treatment or other patient management decisions. A negative result may occur with  improper specimen collection/handling, submission of specimen other than nasopharyngeal swab, presence of viral mutation(s) within the areas targeted by this assay, and inadequate number of viral copies(<138 copies/mL). A negative result must be combined with clinical observations, patient history, and epidemiological information. The expected result is Negative.  Fact Sheet for Patients:  BloggerCourse.com  Fact Sheet for Healthcare Providers:  SeriousBroker.it  This test is no t yet approved or cleared by the [place]  FDA and  has been authorized for detection and/or diagnosis of SARS-CoV-2 by FDA under an Emergency Use Authorization (EUA). This EUA will remain  in effect (meaning this  test can be used) for the duration of the COVID-19 declaration under Section 564(b)(1) of the Act, 21 U.S.C.section 360bbb-3(b)(1), unless the authorization is terminated  or revoked sooner.       Influenza A by PCR NEGATIVE NEGATIVE Final   Influenza B by PCR NEGATIVE NEGATIVE Final    Comment: (NOTE) The Xpert Xpress SARS-CoV-2/FLU/RSV plus assay is intended as an aid in the diagnosis of influenza from Nasopharyngeal swab specimens and should not be used as a sole basis for treatment. Nasal washings and aspirates are unacceptable for Xpert Xpress SARS-CoV-2/FLU/RSV testing.  Fact Sheet for Patients: BloggerCourse.com  Fact Sheet for Healthcare Providers: SeriousBroker.it  This test is not yet approved or cleared by the [place]  FDA and has been authorized for detection and/or diagnosis of SARS-CoV-2 by FDA under an Emergency Use Authorization (EUA). This EUA will remain in effect (meaning this test can be used) for the duration of the COVID-19 declaration under Section 564(b)(1) of the Act, 21 U.S.C. section 360bbb-3(b)(1), unless the authorization is terminated or revoked.     Resp Syncytial Virus by PCR NEGATIVE NEGATIVE Final    Comment: (NOTE) Fact Sheet for Patients: BloggerCourse.com  Fact Sheet for Healthcare Providers: SeriousBroker.it  This test is not yet approved or cleared by the [place]  FDA and has been authorized for detection and/or diagnosis of SARS-CoV-2 by FDA under an Emergency Use Authorization (EUA). This EUA will remain in effect (meaning this test can be used) for the duration of the COVID-19 declaration under Section 564(b)(1) of the Act, 21 U.S.C. section 360bbb-3(b)(1), unless the authorization is terminated or revoked.  Performed at [facility], [address] [place], [place] [address]   Respiratory (~20 pathogens)  panel by PCR     Status: None   Collection Time: 07/18/24  1:39 PM   Specimen: Nasopharyngeal Swab; Respiratory  Result Value Ref Range Status   Adenovirus NOT DETECTED NOT DETECTED Final   Coronavirus 229E NOT DETECTED NOT DETECTED Final    Comment: (NOTE) The Coronavirus on the Respiratory Panel, DOES NOT test for the novel  Coronavirus (2019 nCoV)    Coronavirus HKU1 NOT DETECTED  NOT DETECTED Final   Coronavirus NL63 NOT DETECTED NOT DETECTED Final   Coronavirus OC43 NOT DETECTED NOT DETECTED Final   Metapneumovirus NOT DETECTED NOT DETECTED Final   Rhinovirus / Enterovirus NOT DETECTED NOT DETECTED Final   Influenza A NOT DETECTED NOT DETECTED Final   Influenza B NOT DETECTED NOT DETECTED Final   Parainfluenza Virus 1 NOT DETECTED NOT DETECTED Final   Parainfluenza Virus 2 NOT DETECTED NOT DETECTED Final   Parainfluenza Virus 3 NOT DETECTED NOT DETECTED Final   Parainfluenza Virus 4 NOT DETECTED NOT DETECTED Final   Respiratory Syncytial Virus NOT DETECTED NOT DETECTED Final   Bordetella pertussis NOT DETECTED NOT DETECTED Final   Bordetella Parapertussis NOT DETECTED NOT DETECTED Final   Chlamydophila pneumoniae NOT DETECTED NOT DETECTED Final   Mycoplasma pneumoniae NOT DETECTED NOT DETECTED Final    Comment: Performed at [facility], [address], [place], [place] [address]  Blood culture (routine x 2)     Status: None (Preliminary result)   Collection Time: 07/18/24  1:52 PM   Specimen: BLOOD  Result Value Ref Range Status   Specimen Description BLOOD LEFT ANTECUBITAL  Final   Special Requests   Final    BOTTLES DRAWN AEROBIC AND ANAEROBIC Blood Culture adequate volume   Culture   Final    NO GROWTH 3 DAYS Performed at [facility], [address], [place], [place] [address]    Report Status PENDING  Incomplete  Blood culture (routine x 2)     Status: None (Preliminary result)   Collection Time: 07/18/24 10:16 PM   Specimen: BLOOD  Result Value Ref  Range Status   Specimen Description BLOOD RIGHT ASSIST CONTROL  Final   Special Requests   Final    BOTTLES DRAWN AEROBIC AND ANAEROBIC Blood Culture adequate volume   Culture   Final    NO GROWTH 3 DAYS Performed at [facility], [address], [place], [place] [address]    Report Status PENDING  Incomplete    Procedures and diagnostic studies:  No results found.             LOS: 3 days   Kyden Potash  [facility]   Pager on [email or web address]. If 7PM-7AM, please contact night-coverage at [email or web address]     07/21/2024, 1:38 PM

## 2024-07-21 NOTE — Plan of Care (Signed)

## 2024-07-21 NOTE — Progress Notes (Signed)
 Physical Therapy Treatment Patient Details Name: Todd Gonzales MRN: [id number] DOB: 01-09-1941 Today's Date: 07/21/2024   History of Present Illness Pt is a pleasant 83 y.o. male with medical history significant for morbid obesity, HFpEF, CAD nonocclusive, A-fib on Eliquis , obstructive sleep apnea not on CPAP regularly, HTN, HLD who presented to ED at [facility] from [facility] independent living center for body aches, pains, low energy with shortness of breath.  Patient oxygen was checked which was 70 on room air on arrival of EMS.  MD assessment includes: acute hypoxemic respiratory failure may be due to combination of HFpEF and COPD/OSA, acute exacerbation of HFpEF.    PT Comments  Pt in chair, ready to walk.  Stands and is able to manage urinal to void with supervision.  He is able to complete x 1 lap with RW and cga/supervision.  No LOB or buckling but pt does endorse some general fatigue.  Pt has all needed equipment at home and hopes to transition back to [facility] tomorrow.  Would benefit from continued therapies at home upon discharge.   If plan is discharge home, recommend the following: A little help with walking and/or transfers;A little help with bathing/dressing/bathroom;Assistance with cooking/housework;Assist for transportation   Can travel by private vehicle        Equipment Recommendations  None recommended by PT    Recommendations for Other Services       Precautions / Restrictions Precautions Precautions: Fall Restrictions Weight Bearing Restrictions Per Provider Order: No     Mobility  Bed Mobility                 Patient Response: Cooperative  Transfers                   General transfer comment: in chair before and after    Ambulation/Gait Ambulation/Gait assistance: Supervision Gait Distance (Feet): 180 Feet Assistive device: Rolling walker (2 wheels) Gait Pattern/deviations: Step-through pattern, Decreased step length - right, Decreased  step length - left, Trunk flexed Gait velocity: decreased         Stairs             Wheelchair Mobility     Tilt Bed Tilt Bed Patient Response: Cooperative  Modified Rankin (Stroke Patients Only)       Balance Overall balance assessment: Needs assistance Sitting-balance support: Feet supported Sitting balance-Leahy Scale: Normal     Standing balance support: Bilateral upper extremity supported, During functional activity Standing balance-Leahy Scale: Good Standing balance comment: able tostand to void and manages urinal on his own                            Communication Communication Communication: No apparent difficulties  Cognition Arousal: Alert Behavior During Therapy: WFL for tasks assessed/performed   PT - Cognitive impairments: No apparent impairments                         Following commands: Intact      Cueing Cueing Techniques: Verbal cues  Exercises      General Comments        Pertinent Vitals/Pain Pain Assessment Pain Assessment: No/denies pain    Home Living                          Prior Function  PT Goals (current goals can now be found in the care plan section) Progress towards PT goals: Progressing toward goals    Frequency    Min 2X/week      PT Plan      Co-evaluation              AM-PAC PT 6 Clicks Mobility   Outcome Measure  Help needed turning from your back to your side while in a flat bed without using bedrails?: A Little Help needed moving from lying on your back to sitting on the side of a flat bed without using bedrails?: A Little Help needed moving to and from a bed to a chair (including a wheelchair)?: A Little Help needed standing up from a chair using your arms (e.g., wheelchair or bedside chair)?: A Little Help needed to walk in hospital room?: A Little Help needed climbing 3-5 steps with a railing? : A Lot 6 Click Score: 17    End of  Session Equipment Utilized During Treatment: Gait belt Activity Tolerance: Patient tolerated treatment well Patient left: in chair;with call bell/phone within reach;with family/visitor present Nurse Communication: Mobility status;Other (comment) PT Visit Diagnosis: Difficulty in walking, not elsewhere classified (R26.2);Muscle weakness (generalized) (M62.81)     Time: 8487-8478 PT Time Calculation (min) (ACUTE ONLY): 9 min  Charges:    $Gait Training: 8-22 mins PT General Charges $$ ACUTE PT VISIT: 1 Visit                   Lauraine Gills, PTA 07/21/24, 3:37 PM

## 2024-07-22 ENCOUNTER — Encounter: Payer: Self-pay | Admitting: Hospitalist

## 2024-07-22 DIAGNOSIS — J9601 Acute respiratory failure with hypoxia: Secondary | ICD-10-CM | POA: Diagnosis not present

## 2024-07-22 DIAGNOSIS — J9602 Acute respiratory failure with hypercapnia: Secondary | ICD-10-CM | POA: Diagnosis not present

## 2024-07-22 LAB — MAGNESIUM: Magnesium: 2.3 mg/dL (ref 1.7–2.4)

## 2024-07-22 LAB — GLUCOSE, CAPILLARY: Glucose-Capillary: 103 mg/dL — ABNORMAL HIGH (ref 70–99)

## 2024-07-22 MED ORDER — TAMSULOSIN HCL 0.4 MG PO CAPS: 0.4000 mg | ORAL_CAPSULE | Freq: Every day | ORAL | Status: DC

## 2024-07-22 MED ORDER — FUROSEMIDE 20 MG PO TABS: 20.0000 mg | ORAL_TABLET | Freq: Every day | ORAL | Status: DC

## 2024-07-22 NOTE — Progress Notes (Signed)
 Discharge instructions given to and reviewed with patient. Patient verbalized understanding of all discharge instructions including follow up care and changes to medications.

## 2024-07-22 NOTE — Discharge Summary (Addendum)
 Physician Discharge Summary   Patient: Todd Gonzales MRN: [id number] DOB: 22-Mar-1941  Admit date:     07/18/2024  Discharge date: 07/22/24  Discharge Physician: AIDA CHO   PCP: Cleatus Arlyss RAMAN, MD   Recommendations at discharge:   Follow-up with PCP in 1 week Follow-up with pulmonologist as soon as possible Outpatient physical therapy at [facility] independent living facility  Discharge Diagnoses: Principal Problem:   Acute on chronic diastolic CHF (congestive heart failure) (HCC) Active Problems:   Acute respiratory failure with hypoxia and hypercapnia (HCC)   HTN (hypertension)   HLD (hyperlipidemia)   SCC (squamous cell carcinoma), face   Bilateral lower extremity edema   Coronary artery disease, non-occlusive   Paroxysmal atrial fibrillation/flutter (HCC)   Obesity (BMI 30-39.9)   Hypokalemia  Resolved Problems:   * No resolved hospital problems. *  Hospital Course:   Todd Gonzales is a 83 y.o. male for morbid obesity, HFpEF, CAD nonocclusive, A-fib on Eliquis , obstructive sleep apnea not on CPAP regularly, HTN, HLD who presented to ED at [facility] from [facility] independent living center because of bodyaches, low energy, increasing leg swelling and shortness of breath.  He has not been using his diuretics because of frequent urination.  Oxygen saturation was 70% on room air when EMS arrived.  He was placed on oxygen and transported to the ED.     ED Course: Upon arrival to the ED, patient is found to be fluid overloaded, hypoxemic, bilateral leg swelling, with CO2 retention of 72.  His COVID and flu were negative.  Chest x-ray showed no pneumonia but some congestion.  His troponins were negative.  Patient was given Lasix , placed him on BiPAP, oxygen and hospitalist service was consulted for evaluation for admission.       Assessment and Plan:   Acute hypoxic and hypercapnic respiratory failure, chronic hypercapnic respiratory failure, OSA: Improved.  He has been  weaned off of oxygen and is tolerating room air. Continue CPAP at night. Follow-up with pulmonologist to determine eligibility for BiPAP.    Acute on chronic diastolic CHF: Improved.  Continue oral Lasix  at discharge.  Recommended that he takes Lasix  daily instead of as needed for swelling.  2D echo on 07/19/2024 showed EF estimated at 60 to 65%, grade 1 diastolic dysfunction     Hypokalemia: Improved.       Paroxysmal atrial fibrillation: Continue Eliquis  and metoprolol      History of squamous cell carcinoma: Outpatient follow-up with PCP   General weakness: Outpatient physical therapy at [facility] recommended     Comorbidities include CAD, hypertension, hyperlipidemia     His condition has improved and is deemed stable for discharge to [facility] independent living facility today.                  Consultants: None  Procedures performed: None Disposition: Independent living facility Diet recommendation:  Discharge Diet Orders (From admission, onward)     Start     Ordered   07/22/24 0000  Diet - low sodium heart healthy        07/22/24 0921           Cardiac diet DISCHARGE MEDICATION: Allergies as of 07/22/2024       Reactions   Codeine Other (See Comments)   Unknown reaction   Lipitor [atorvastatin ] Other (See Comments)   Arthralgias    Bactrim [sulfamethoxazole-trimethoprim] Rash        Medication List     STOP taking  these medications    OVER THE COUNTER MEDICATION       TAKE these medications    acetaminophen  650 MG CR tablet Commonly known as: TYLENOL  Take 1,300 mg by mouth 2 (two) times daily as needed for pain.   amoxicillin  500 MG capsule Commonly known as: AMOXIL  Take 2,000 mg by mouth once. Prior to dentist.   Citrucel oral powder Generic drug: methylcellulose Take 1 packet by mouth daily.   cyanocobalamin  1000 MCG/ML injection Commonly known as: VITAMIN B12 Inject 1 mL (1,000 mcg total) into the muscle every 30  (thirty) days.   diclofenac  Sodium 1 % Gel Commonly known as: VOLTAREN  Apply 1 Application topically every 6 (six) hours as needed (pain).   Eliquis  5 MG Tabs tablet Generic drug: apixaban  TAKE ONE TABLET BY MOUTH TWICE A DAY   fluorouracil  5 % cream Commonly known as: EFUDEX  Apply topically 2 (two) times daily. Bid 5-7 days to temples, cheeks, forehead and nose   furosemide  20 MG tablet Commonly known as: LASIX  Take 1 tablet (20 mg total) by mouth daily. What changed:  when to take this reasons to take this   gabapentin  300 MG capsule Commonly known as: NEURONTIN  TAKE ONE CAPSULE BY MOUTH ONCE DAILY   hydrocortisone  2.5 % rectal cream Commonly known as: Proctosol HC Place 1 Application rectally 2 (two) times daily as needed for hemorrhoids or anal itching. Home med.   metoprolol  tartrate 25 MG tablet Commonly known as: LOPRESSOR  Held as of 03/12/24 What changed:  how much to take how to take this when to take this   ondansetron  4 MG tablet Commonly known as: ZOFRAN  TAKE ONE TABLET (4 MG TOTAL) BY MOUTH EVERY EIGHT (EIGHT) HOURS AS NEEDED FOR NAUSEA OR VOMITING.   tamsulosin  0.4 MG Caps capsule Commonly known as: FLOMAX  Take 1 capsule (0.4 mg total) by mouth daily. What changed: See the new instructions.   triamcinolone  cream 0.1 % Commonly known as: KENALOG  Apply 1-2 times daily to aa, lower legs for up to 2 weeks. Avoid applying to face, groin, and axilla. Use as directed. Long-term use can cause thinning of the skin.        Follow-up Information     Vivienne Lonni Ingle, NP. Go on 07/25/2024.   Specialties: Cardiology, Radiology Why: [facility] 07/25/24 @ 3:10 PM Hospital Follow-up [facility], [address] Contact information: [address] [place] [address] [phone]                Discharge Exam: Filed Weights   07/20/24 0500 07/21/24 0500 07/22/24 0500  Weight: 107.7 kg 108.3 kg 108.4 kg    GEN: NAD SKIN: Warm and dry EYES: No pallor or icterus ENT: MMM CV: RRR PULM: CTA B ABD: soft, obese, NT, +BS CNS: AAO x 3, non focal EXT: No edema or tenderness   Condition at discharge: good  The results of significant diagnostics from this hospitalization (including imaging, microbiology, ancillary and laboratory) are listed below for reference.   Imaging Studies: ECHOCARDIOGRAM COMPLETE Result Date: 07/19/2024    ECHOCARDIOGRAM REPORT   Patient Name:   BARCLAY LENNOX Date of Exam: 07/19/2024 Medical Rec #:  [id number]     Height:       70.0 in Accession #:    [id number]    Weight:       240.0 lb Date of Birth:  06/29/1941     BSA:          2.255 m  Patient Age:    83 years      BP:           120/72 mmHg Patient Gender: M             HR:           69 bpm. Exam Location:  [facility] Procedure: 2D Echo, Cardiac Doppler, Color Doppler and Intracardiac            Opacification Agent (Both Spectral and Color Flow Doppler were            utilized during procedure). Indications:     CHF  History:         Patient has prior history of Echocardiogram examinations, most                  recent 12/21/2023. CHF, CAD; Risk Factors:Hypertension and                  Dyslipidemia.  Sonographer:     Philomena Daring Referring Phys:  [id number] XZDYJA PAUDEL Diagnosing Phys: Redell Cave MD IMPRESSIONS  1. Left ventricular ejection fraction, by estimation, is 60 to 65%. Left ventricular ejection fraction by PLAX is 63 %. The left ventricle has normal function. The left ventricle has no regional wall motion abnormalities. Left ventricular diastolic parameters are consistent with Grade I diastolic dysfunction (impaired relaxation).  2. Right ventricular systolic function is normal. The right ventricular size is normal.  3. The mitral valve is normal in structure. No evidence of mitral valve regurgitation.  4. The aortic valve is calcified. Aortic valve regurgitation is not visualized. Aortic valve sclerosis/calcification is  present, without any evidence of aortic stenosis. Aortic valve mean gradient measures 9.8 mmHg.  5. The inferior vena cava is dilated in size with >50% respiratory variability, suggesting right atrial pressure of 8 mmHg. FINDINGS  Left Ventricle: Left ventricular ejection fraction, by estimation, is 60 to 65%. Left ventricular ejection fraction by PLAX is 63 %. The left ventricle has normal function. The left ventricle has no regional wall motion abnormalities. Definity  contrast agent was given IV to delineate the left ventricular endocardial borders. The left ventricular internal cavity size was normal in size. There is no left ventricular hypertrophy. Left ventricular diastolic parameters are consistent with Grade I diastolic dysfunction (impaired relaxation). Right Ventricle: The right ventricular size is normal. No increase in right ventricular wall thickness. Right ventricular systolic function is normal. Left Atrium: Left atrial size was normal in size. Right Atrium: Right atrial size was normal in size. Pericardium: There is no evidence of pericardial effusion. Mitral Valve: The mitral valve is normal in structure. No evidence of mitral valve regurgitation. Tricuspid Valve: The tricuspid valve is normal in structure. Tricuspid valve regurgitation is not demonstrated. Aortic Valve: The aortic valve is calcified. Aortic valve regurgitation is not visualized. Aortic valve sclerosis/calcification is present, without any evidence of aortic stenosis. Aortic valve mean gradient measures 9.8 mmHg. Aortic valve peak gradient measures 18.2 mmHg. Aortic valve area, by VTI measures 1.77 cm. Pulmonic Valve: The pulmonic valve was normal in structure. Pulmonic valve regurgitation is not visualized. Aorta: The aortic root is normal in size and structure. Venous: The inferior vena cava is dilated in size with greater than 50% respiratory variability, suggesting right atrial pressure of 8 mmHg. IAS/Shunts: No atrial level  shunt detected by color flow Doppler.  LEFT VENTRICLE PLAX 2D LV EF:         Left  Diastology                ventricular     LV e' medial:    7.40 cm/s                ejection        LV E/e' medial:  12.8                fraction by     LV e' lateral:   7.40 cm/s                PLAX is 63      LV E/e' lateral: 12.8                %. LVIDd:         4.40 cm LVIDs:         2.90 cm LV PW:         1.20 cm LV IVS:        1.00 cm LVOT diam:     2.00 cm LV SV:         81 LV SV Index:   36 LVOT Area:     3.14 cm  RIGHT VENTRICLE             IVC RV S prime:     11.40 cm/s  IVC diam: 2.40 cm TAPSE (M-mode): 1.9 cm LEFT ATRIUM             Index        RIGHT ATRIUM           Index LA diam:        3.70 cm 1.64 cm/m   RA Area:     12.00 cm LA Vol (A2C):   49.0 ml 21.72 ml/m  RA Volume:   27.20 ml  12.06 ml/m LA Vol (A4C):   44.7 ml 19.82 ml/m LA Biplane Vol: 51.0 ml 22.61 ml/m  AORTIC VALVE AV Area (Vmax):    1.72 cm AV Area (Vmean):   1.71 cm AV Area (VTI):     1.77 cm AV Vmax:           213.60 cm/s AV Vmean:          143.000 cm/s AV VTI:            0.454 m AV Peak Grad:      18.2 mmHg AV Mean Grad:      9.8 mmHg LVOT Vmax:         117.00 cm/s LVOT Vmean:        77.950 cm/s LVOT VTI:          0.256 m LVOT/AV VTI ratio: 0.56  AORTA Ao Root diam: 2.50 cm MITRAL VALVE MV Area (PHT): 3.27 cm     SHUNTS MV Decel Time: 232 msec     Systemic VTI:  0.26 m MV E velocity: 94.90 cm/s   Systemic Diam: 2.00 cm MV A velocity: 107.00 cm/s MV E/A ratio:  0.89 Redell Cave MD Electronically signed by Redell Cave MD Signature Date/Time: 07/19/2024/12:51:23 PM    Final    DG Chest Portable 1 View Result Date: 07/18/2024 CLINICAL DATA:  Short of breath EXAM: PORTABLE CHEST 1 VIEW COMPARISON:  None Available. FINDINGS: Normal cardiac silhouette. Low lung volumes. Mild central venous congestion. No change from prior. IMPRESSION: Low lung volumes and central venous congestion. Electronically Signed   By: Jackquline Boxer M.D.   On: 07/18/2024 14:02  Microbiology: Results for orders placed or performed during the hospital encounter of 07/18/24  Resp panel by RT-PCR (RSV, Flu A&B, Covid) Anterior Nasal Swab     Status: None   Collection Time: 07/18/24  1:38 PM   Specimen: Anterior Nasal Swab  Result Value Ref Range Status   SARS Coronavirus 2 by RT PCR NEGATIVE NEGATIVE Final    Comment: (NOTE) SARS-CoV-2 target nucleic acids are NOT DETECTED.  The SARS-CoV-2 RNA is generally detectable in upper respiratory specimens during the acute phase of infection. The lowest concentration of SARS-CoV-2 viral copies this assay can detect is 138 copies/mL. A negative result does not preclude SARS-Cov-2 infection and should not be used as the sole basis for treatment or other patient management decisions. A negative result may occur with  improper specimen collection/handling, submission of specimen other than nasopharyngeal swab, presence of viral mutation(s) within the areas targeted by this assay, and inadequate number of viral copies(<138 copies/mL). A negative result must be combined with clinical observations, patient history, and epidemiological information. The expected result is Negative.  Fact Sheet for Patients:  BloggerCourse.com  Fact Sheet for Healthcare Providers:  SeriousBroker.it  This test is no t yet approved or cleared by the [place]  FDA and  has been authorized for detection and/or diagnosis of SARS-CoV-2 by FDA under an Emergency Use Authorization (EUA). This EUA will remain  in effect (meaning this test can be used) for the duration of the COVID-19 declaration under Section 564(b)(1) of the Act, 21 U.S.C.section 360bbb-3(b)(1), unless the authorization is terminated  or revoked sooner.       Influenza A by PCR NEGATIVE NEGATIVE Final   Influenza B by PCR NEGATIVE NEGATIVE Final    Comment: (NOTE) The Xpert Xpress  SARS-CoV-2/FLU/RSV plus assay is intended as an aid in the diagnosis of influenza from Nasopharyngeal swab specimens and should not be used as a sole basis for treatment. Nasal washings and aspirates are unacceptable for Xpert Xpress SARS-CoV-2/FLU/RSV testing.  Fact Sheet for Patients: BloggerCourse.com  Fact Sheet for Healthcare Providers: SeriousBroker.it  This test is not yet approved or cleared by the [place]  FDA and has been authorized for detection and/or diagnosis of SARS-CoV-2 by FDA under an Emergency Use Authorization (EUA). This EUA will remain in effect (meaning this test can be used) for the duration of the COVID-19 declaration under Section 564(b)(1) of the Act, 21 U.S.C. section 360bbb-3(b)(1), unless the authorization is terminated or revoked.     Resp Syncytial Virus by PCR NEGATIVE NEGATIVE Final    Comment: (NOTE) Fact Sheet for Patients: BloggerCourse.com  Fact Sheet for Healthcare Providers: SeriousBroker.it  This test is not yet approved or cleared by the [place]  FDA and has been authorized for detection and/or diagnosis of SARS-CoV-2 by FDA under an Emergency Use Authorization (EUA). This EUA will remain in effect (meaning this test can be used) for the duration of the COVID-19 declaration under Section 564(b)(1) of the Act, 21 U.S.C. section 360bbb-3(b)(1), unless the authorization is terminated or revoked.  Performed at [facility], [address] [place], [place] [address]   Respiratory (~20 pathogens) panel by PCR     Status: None   Collection Time: 07/18/24  1:39 PM   Specimen: Nasopharyngeal Swab; Respiratory  Result Value Ref Range Status   Adenovirus NOT DETECTED NOT DETECTED Final   Coronavirus 229E NOT DETECTED NOT DETECTED Final    Comment: (NOTE) The Coronavirus on the Respiratory Panel, DOES NOT test for the novel  Coronavirus (2019 nCoV)    Coronavirus HKU1 NOT DETECTED NOT DETECTED Final   Coronavirus NL63 NOT DETECTED NOT DETECTED Final   Coronavirus OC43 NOT DETECTED NOT DETECTED Final   Metapneumovirus NOT DETECTED NOT DETECTED Final   Rhinovirus / Enterovirus NOT DETECTED NOT DETECTED Final   Influenza A NOT DETECTED NOT DETECTED Final   Influenza B NOT DETECTED NOT DETECTED Final   Parainfluenza Virus 1 NOT DETECTED NOT DETECTED Final   Parainfluenza Virus 2 NOT DETECTED NOT DETECTED Final   Parainfluenza Virus 3 NOT DETECTED NOT DETECTED Final   Parainfluenza Virus 4 NOT DETECTED NOT DETECTED Final   Respiratory Syncytial Virus NOT DETECTED NOT DETECTED Final   Bordetella pertussis NOT DETECTED NOT DETECTED Final   Bordetella Parapertussis NOT DETECTED NOT DETECTED Final   Chlamydophila pneumoniae NOT DETECTED NOT DETECTED Final   Mycoplasma pneumoniae NOT DETECTED NOT DETECTED Final    Comment: Performed at [facility], [address] [place], [place] [address]  Blood culture (routine x 2)     Status: None (Preliminary result)   Collection Time: 07/18/24  1:52 PM   Specimen: BLOOD  Result Value Ref Range Status   Specimen Description BLOOD LEFT ANTECUBITAL  Final   Special Requests   Final    BOTTLES DRAWN AEROBIC AND ANAEROBIC Blood Culture adequate volume   Culture   Final    NO GROWTH 4 DAYS Performed at [facility], [address] [place], [place] [address]    Report Status PENDING  Incomplete  Blood culture (routine x 2)     Status: None (Preliminary result)   Collection Time: 07/18/24 10:16 PM   Specimen: BLOOD  Result Value Ref Range Status   Specimen Description BLOOD RIGHT ASSIST CONTROL  Final   Special Requests   Final    BOTTLES DRAWN AEROBIC AND ANAEROBIC Blood Culture adequate volume   Culture   Final    NO GROWTH 4 DAYS Performed at [facility], [address] [place], [place] [address]    Report Status PENDING  Incomplete     Labs: CBC: Recent Labs  Lab 07/18/24 1338 07/19/24 0413  WBC 7.2 7.4  NEUTROABS 5.1  --   HGB 13.9 13.1  HCT 44.1 42.1  MCV 101.8* 101.7*  PLT 184 189   Basic Metabolic Panel: Recent Labs  Lab 07/18/24 1338 07/19/24 0413 07/20/24 0410 07/21/24 0559 07/22/24 0355  NA 139 142 140 140  --   K 4.4 3.4* 3.4* 3.5  --   CL 103 98 94* 97*  --   CO2 31 34* 33* 33*  --   GLUCOSE 101* 104* 102* 109*  --   BUN 12 13 17  28*  --   CREATININE 0.76 0.92 0.87 1.02  --   CALCIUM  8.5* 8.4* 8.7* 8.4*  --   MG  --   --   --  2.2 2.3   Liver Function Tests: Recent Labs  Lab 07/18/24 1338 07/19/24 0413  AST 12* 12*  ALT 10 9  ALKPHOS 42 36*  BILITOT 1.3* 1.8*  PROT 6.6 6.1*  ALBUMIN 3.5 3.3*   CBG: Recent Labs  Lab 07/22/24 0437  GLUCAP 103*    Discharge time spent: greater than 30 minutes.  Signed: AIDA CHO, MD [facility] 07/22/2024

## 2024-07-22 NOTE — Progress Notes (Signed)
 Heart Failure Nurse Navigator Progress Note  PCP: Cleatus Arlyss RAMAN, MD PCP-Cardiologist: Alm Clay, MD Admission Diagnosis: Acute on chronic respiratory failure with hypoxia and hypercapnia [facility]) Admitted from: Independent living @ [facility]  Presentation:   [name] presented with body aches, and more fatigue than normal.  He was 70 % on RA so he was placed on 4L.  Patient with Hx: Atrial Fibrillation on blood and denies any falls.  No chest pain or abdominal pain. BNP 200. He reported that he had not been taking his Lasix  recently. Chest x-ray: Low lung volumes and central venous congestion.  ECHO/ LVEF: 60-65% -Grade 1 Diastolic Dysfunction (impaired relaxation) Clinical Course:  Past Medical History:  Diagnosis Date   Actinic keratosis 03/28/2023   glabella, needs LN2   Aortic atherosclerosis (HCC)    a. Noted on CT 02/2023.   Arthritis    Basal cell carcinoma 03/28/2023   right paranasal, no tx, cleared with bx   BPH (benign prostatic hyperplasia)    nocturia x4 at baseline   CAD (coronary artery disease)    a. 08/2023 Cor CTA: LM nl, LAD 25-49, LCX <25, RCA 25-49. Ca2+ = 409 (43rd %'ile).   Chronic heart failure with preserved ejection fraction (HFpEF) (HCC)    a. 04/2023 Echo: EF 60-65%, no rwma, mild LVH, nl RV fxn, triv MR; b. 12/2023 TEE: EF 60-65%, nl RV size/fxn, mildly dil LA, no LA/LAA thrombus, mild-mod MR, mild AI.   H/O hiatal hernia    Hemorrhoids    Hyperlipidemia    Hypertension    Dr Vernadine- LOV with clearance 3/13 on chart   IGT (impaired glucose tolerance)    last AIC 5.5- diet controlled- per office note 3/13   Mitral regurgitation    a. 12/2023 TEE: mild to mod MR in setting of afib.   Paroxysmal atrial fibrillation (HCC) 04/14/2023   a. 04/2023 Admitted with A-fib RVR.  Normal echo.  Discharged on amiodarone ; b. 05/2023 Zio: Predominantly sinus rhythm at 66 (50-88).  First-degree AV block.  4 beats of NSVT.  No sustained arrhythmias.  Amiodarone   discontinued; c. 12/2023 recurrent Afib->amio->TEE/DCCV.   SCC (squamous cell carcinoma) 02/10/2005   scc in situ-left forearm.   SCC (squamous cell carcinoma) 02/12/2024   L lateral upper pretibia, EDC   SCCA (squamous cell carcinoma) of skin 02/10/2005   Right Outer Eye (in situ) (curet and excision)   SCCA (squamous cell carcinoma) of skin 02/13/2006   Right Ear (well diff) (curet and 5FU)   SCCA (squamous cell carcinoma) of skin 04/05/2006   Left Ear Bowl (in situ) (tx p bx)   SCCA (squamous cell carcinoma) of skin 07/06/2010   Right Cheek (in situ) (curet and zyclara)   SCCA (squamous cell carcinoma) of skin 04/27/2009   Left Upper Forearm (in situ) (curet 5FU)   SCCA (squamous cell carcinoma) of skin 08/28/2014   Right Cheek (well diff)   SCCA (squamous cell carcinoma) of skin 03/21/2016   Left Jawline Ant (well diff) (curet and 5FU)   SCCA (squamous cell carcinoma) of skin 01/19/2017   Right Hand (in situ) (tx p bx)   SCCA (squamous cell carcinoma) of skin 03/04/1840   Left Jawline Inf (in situ) (curet and 5FU)   SCCA (squamous cell carcinoma) of skin 12/23/2020   Left Forehead (well diff) (tx p bx)   SCCA (squamous cell carcinoma) of skin 12/23/2020   Left Submandibular Area (in situ) (tx p bx)   Seasonal allergies  Spinal stenosis    Squamous cell carcinoma of skin 08/31/2020   in situ-left jawline cheek   Squamous cell carcinoma of skin 04/27/2021   infil- left nasal sidewall(MOHS)   Squamous cell carcinoma of skin 03/28/2023   Left medial cheek above nasolabial, not treated, but cleared with bx   Squamous cell carcinoma of skin 08/16/2023   Left lateral upper pretibia, EDC   Squamous cell carcinoma of skin 05/28/2024   SCC IS,  Left Upper Temple, [facility] 06/10/24   Squamous cell carcinoma of skin of face 04/27/2021   infil- Left Temporal scalp (MOHS   UTI (lower urinary tract infection)    required admission to [facility]     Social History   Socioeconomic History    Marital status: Married    Spouse name: Not on file   Number of children: 2   Years of education: Not on file   Highest education level: Not on file  Occupational History   Occupation: retired  Tobacco Use   Smoking status: Never   Smokeless tobacco: Never  Vaping Use   Vaping status: Never Used  Substance and Sexual Activity   Alcohol use: Yes    Comment: socially, wine   Drug use: No   Sexual activity: Not on file  Other Topics Concern   Not on file  Social History Narrative   Plays bridge   [facility] [employer]     2 kids, one local.     [employer] grad.     Married 1962   Prev in Engineer, maintenance (IT), retired.     Social Drivers of Corporate investment banker Strain: Low Risk  (08/08/2023)   Overall Financial Resource Strain (CARDIA)    Difficulty of Paying Living Expenses: Not hard at all  Food Insecurity: No Food Insecurity (07/19/2024)   Hunger Vital Sign    Worried About Running Out of Food in the Last Year: Never true    Ran Out of Food in the Last Year: Never true  Transportation Needs: No Transportation Needs (07/19/2024)   PRAPARE - Administrator, Civil Service (Medical): No    Lack of Transportation (Non-Medical): No  Physical Activity: Inactive (08/08/2023)   Exercise Vital Sign    Days of Exercise per Week: 0 days    Minutes of Exercise per Session: 0 min  Stress: No Stress Concern Present (08/08/2023)   [employer] of Occupational Health - Occupational Stress Questionnaire    Feeling of Stress : Not at all  Social Connections: Moderately Integrated (07/19/2024)   Social Connection and Isolation Panel    Frequency of Communication with Friends and Family: Three times a week    Frequency of Social Gatherings with Friends and Family: Once a week    Attends Religious Services: 1 to 4 times per year    Active Member of [employer] or Organizations: No    Attends Engineer, structural: Never    Marital Status: Married   Water engineer and  Provision:  Detailed education and instructions provided on heart failure disease management including the following:  Signs and symptoms of Heart Failure When to call the physician Importance of daily weights Low sodium diet Fluid restriction Medication management Anticipated future follow-up appointments  Patient education given on each of the above topics.  Patient acknowledges understanding via teach back method and acceptance of all instructions.  Education Materials:  Living Better With Heart Failure Booklet, HF zone tool, & Daily Weight Tracker Tool.  Patient has scale at home: Yes Patient has pill box at home: Yes    High Risk Criteria for Readmission and/or Poor Patient Outcomes: Heart failure hospital admissions (last 6 months): 0  No Show rate: 4% Difficult social situation: None Demonstrates medication adherence: No.  Stopped taking his Lasix  recently Primary Language: English Literacy level: Reading, Writing, & Comprehension  Barriers of Care:   Patient has upcoming eye surgery.  Has been on eye drops that have caused some visual problems for him.  Considerations/Referrals:   Referral made to Heart Failure Pharmacist Stewardship: Yes Referral made to Heart Failure CSW/NCM TOC: No Referral made to Heart & Vascular TOC clinic: NO.  Patient is following up with [facility] on 07/25/24 @ 3:10 PM.  Items for Follow-up on DC/TOC: Diet & Fluid Restrictions Daily Weights Heart Failure Medication Compliance Continued Heart Failure Education  Charmaine Pines, RN, BSN [facility] Heart Failure Navigator Secure Chat Only

## 2024-07-22 NOTE — Progress Notes (Signed)
 Patient removed Bipap and refuse to wear it back. Patient is 97% in room air.

## 2024-07-22 NOTE — Plan of Care (Signed)

## 2024-07-22 NOTE — TOC Transition Note (Signed)
 Transition of Care [facility]) - Discharge Note   Patient Details  Name: Todd Gonzales MRN: [id number] Date of Birth: 1941-11-24  Transition of Care [facility]) CM/SW Contact:  Lauraine JAYSON Carpen, LCSW Phone Number: 07/22/2024, 12:03 PM   Clinical Narrative:  Patient has orders to discharge home today. CSW met with patient. No family at bedside. CSW introduced role and explained that PT recommendations would be discussed. Patient lives at [facility]. He would prefer to use the [facility] for PT. Received call from staff after patient left stating that patient's insurance isn't in network with their therapy program. CSW called wife. He has worked with [facility] and [facility] in the past. No preference between the two. [facility] accepted referral for PT. No further concerns. CSW signing off.   Final next level of care: Home w Home Health Services Barriers to Discharge: No Barriers Identified   Patient Goals and CMS Choice     Choice offered to / list presented to : Spouse      Discharge Placement                Patient to be transferred to facility by: Wife Name of family member notified: Addison Whidbee Patient and family notified of of transfer: 07/22/24  Discharge Plan and Services Additional resources added to the After Visit Summary for                            [facility] Arranged: PT [facility] Agency: [facility] Date [facility] Agency Contacted: 07/22/24   Representative spoke with at [facility] Agency: Larraine  Social Drivers of Health (SDOH) Interventions SDOH Screenings   Food Insecurity: No Food Insecurity (07/19/2024)  Housing: Low Risk  (07/19/2024)  Transportation Needs: No Transportation Needs (07/19/2024)  Utilities: Not At Risk (07/19/2024)  Alcohol Screen: Low Risk  (08/08/2023)  Depression (PHQ2-9): Low Risk  (08/08/2023)  Financial Resource Strain: Low Risk  (08/08/2023)  Physical Activity: Inactive (08/08/2023)  Social Connections: Moderately Integrated (07/19/2024)  Stress: No  Stress Concern Present (08/08/2023)  Tobacco Use: Low Risk  (07/19/2024)  Health Literacy: Adequate Health Literacy (08/08/2023)     Readmission Risk Interventions    04/16/2023   11:32 AM  Readmission Risk Prevention Plan  Post Dischage Appt Complete  Medication Screening Complete  Transportation Screening Complete

## 2024-07-23 ENCOUNTER — Telehealth: Payer: Self-pay | Admitting: *Deleted

## 2024-07-23 ENCOUNTER — Ambulatory Visit: Payer: Self-pay | Admitting: Family Medicine

## 2024-07-23 ENCOUNTER — Telehealth: Payer: Self-pay | Admitting: Family Medicine

## 2024-07-23 DIAGNOSIS — Z6834 Body mass index (BMI) 34.0-34.9, adult: Secondary | ICD-10-CM | POA: Diagnosis not present

## 2024-07-23 DIAGNOSIS — C44320 Squamous cell carcinoma of skin of unspecified parts of face: Secondary | ICD-10-CM | POA: Diagnosis not present

## 2024-07-23 DIAGNOSIS — I2510 Atherosclerotic heart disease of native coronary artery without angina pectoris: Secondary | ICD-10-CM | POA: Diagnosis not present

## 2024-07-23 DIAGNOSIS — I480 Paroxysmal atrial fibrillation: Secondary | ICD-10-CM | POA: Diagnosis not present

## 2024-07-23 DIAGNOSIS — I4892 Unspecified atrial flutter: Secondary | ICD-10-CM | POA: Diagnosis not present

## 2024-07-23 DIAGNOSIS — E785 Hyperlipidemia, unspecified: Secondary | ICD-10-CM | POA: Diagnosis not present

## 2024-07-23 DIAGNOSIS — J9622 Acute and chronic respiratory failure with hypercapnia: Secondary | ICD-10-CM | POA: Diagnosis not present

## 2024-07-23 DIAGNOSIS — E669 Obesity, unspecified: Secondary | ICD-10-CM | POA: Diagnosis not present

## 2024-07-23 DIAGNOSIS — J9601 Acute respiratory failure with hypoxia: Secondary | ICD-10-CM | POA: Diagnosis not present

## 2024-07-23 DIAGNOSIS — E876 Hypokalemia: Secondary | ICD-10-CM | POA: Diagnosis not present

## 2024-07-23 DIAGNOSIS — Z7901 Long term (current) use of anticoagulants: Secondary | ICD-10-CM | POA: Diagnosis not present

## 2024-07-23 DIAGNOSIS — Z9181 History of falling: Secondary | ICD-10-CM | POA: Diagnosis not present

## 2024-07-23 DIAGNOSIS — I110 Hypertensive heart disease with heart failure: Secondary | ICD-10-CM | POA: Diagnosis not present

## 2024-07-23 DIAGNOSIS — I5033 Acute on chronic diastolic (congestive) heart failure: Secondary | ICD-10-CM | POA: Diagnosis not present

## 2024-07-23 DIAGNOSIS — G4733 Obstructive sleep apnea (adult) (pediatric): Secondary | ICD-10-CM | POA: Diagnosis not present

## 2024-07-23 LAB — CULTURE, BLOOD (ROUTINE X 2)
Culture: NO GROWTH
Culture: NO GROWTH
Special Requests: ADEQUATE
Special Requests: ADEQUATE

## 2024-07-23 NOTE — Telephone Encounter (Signed)
 Please give the order.  Thanks.

## 2024-07-23 NOTE — Telephone Encounter (Signed)
 Copied from CRM [id number]. Topic: Clinical - Home Health Verbal Orders >> Jul 23, 2024  1:50 PM Rosina BIRCH wrote: Caller/Agency: soni from [facility] Callback Number: [phone] Service Requested: Physical Therapy Frequency: one time a week for seven weeks Any new concerns about the patient? No

## 2024-07-23 NOTE — Transitions of Care (Post Inpatient/ED Visit) (Signed)
   07/23/2024  Name: Todd Gonzales MRN: [id number] DOB: 1941/06/08  Today's TOC FU Call Status: Today's TOC FU Call Status:: Unsuccessful Call (1st Attempt) Unsuccessful Call (1st Attempt) Date: 07/23/24  Attempted to reach the patient regarding the most recent Inpatient/ED visit.  Follow Up Plan: Additional outreach attempts will be made to reach the patient to complete the Transitions of Care (Post Inpatient/ED visit) call.   Cathlean Headland BSN RN [place] [facility] Health Care Management Coordinator [email or web address] Direct Dial: [phone]  Fax: [phone] Website: [email or web address]

## 2024-07-23 NOTE — Telephone Encounter (Signed)
 Called Todd Gonzales and gave verbal orders as requested. Will call if any questions.

## 2024-07-23 NOTE — Transitions of Care (Post Inpatient/ED Visit) (Signed)
 07/23/2024  Name: Todd Gonzales MRN: [id number] DOB: Apr 23, 1941  Today's TOC FU Call Status: Today's TOC FU Call Status:: Successful TOC FU Call Completed TOC FU Call Complete Date: 07/23/24 Patient's Name and Date of Birth confirmed.  Transition Care Management Follow-up Telephone Call Date of Discharge: 07/22/24 Discharge Facility: [facility]) Type of Discharge: Inpatient Admission Primary Inpatient Discharge Diagnosis:: Acute respiratory failure with hypoxia and hypercapnia How have you been since you were released from the hospital?: Better Any questions or concerns?: No  Items Reviewed: Did you receive and understand the discharge instructions provided?: Yes Medications obtained,verified, and reconciled?: Yes (Medications Reviewed) Any new allergies since your discharge?: No Dietary orders reviewed?: No Do you have support at home?: Yes People in Home [RPT]: spouse Name of Support/Comfort Primary Source: [place]  Medications Reviewed Today: Medications Reviewed Today     Reviewed by Kennieth Cathlean DEL, RN (Case Manager) on 07/23/24 at 1254  Med List Status: <None>   Medication Order Taking? Sig Documenting Provider Last Dose Status Informant  acetaminophen  (TYLENOL ) 650 MG CR tablet [id number] Yes Take 1,300 mg by mouth 2 (two) times daily as needed for pain. [provider]  Active Spouse/Significant Other  amoxicillin  (AMOXIL ) 500 MG capsule [id number] Yes Take 2,000 mg by mouth once. Prior to dentist. [provider]  Active Spouse/Significant Other  cyanocobalamin  (VITAMIN B12) 1000 MCG/ML injection [id number] Yes Inject 1 mL (1,000 mcg total) into the muscle Gonzales 30 (thirty) days. Cleatus Arlyss RAMAN, MD  Active Spouse/Significant Other  diclofenac  Sodium (VOLTAREN ) 1 % GEL [id number] Yes Apply 1 Application topically Gonzales 6 (six) hours as needed (pain). [provider]  Active Spouse/Significant Other           Med Note  CATHY OVAL DEL [place] Jul 18, 2024  3:51 PM)    ELIQUIS  5 MG TABS tablet [id number] Yes TAKE ONE TABLET BY MOUTH TWICE A DAY Cleatus Arlyss RAMAN, MD  Active Spouse/Significant Other  fluorouracil  (EFUDEX ) 5 % cream [id number] Yes Apply topically 2 (two) times daily. Bid 5-7 days to temples, cheeks, forehead and nose Jackquline Sawyer, MD  Active Spouse/Significant Other           Med Note CATHY OVAL DEL   Thu Jul 18, 2024  3:51 PM)    furosemide  (LASIX ) 20 MG tablet [id number] Yes Take 1 tablet (20 mg total) by mouth daily. Jens Durand, MD  Active   gabapentin  (NEURONTIN ) 300 MG capsule [id number] Yes TAKE ONE CAPSULE BY MOUTH ONCE DAILY Cleatus Arlyss RAMAN, MD  Active Spouse/Significant Other  hydrocortisone  (PROCTOSOL HC) 2.5 % rectal cream [id number] Yes Place 1 Application rectally 2 (two) times daily as needed for hemorrhoids or anal itching. Home med. Awanda City, MD  Active Spouse/Significant Other           Med Note CATHY OVAL DEL   Thu Jul 18, 2024  3:51 PM)    methylcellulose (CITRUCEL) oral powder [id number] Yes Take 1 packet by mouth daily. Cleatus Arlyss RAMAN, MD  Active Spouse/Significant Other  metoprolol  tartrate (LOPRESSOR ) 25 MG tablet [id number] Yes Held as of 03/12/24  Patient taking differently: Take 25 mg by mouth 2 (two) times daily. Held as of 03/12/24   Cleatus Arlyss RAMAN, MD  Active Spouse/Significant Other  ondansetron  (ZOFRAN ) 4 MG tablet [id number] Yes TAKE ONE TABLET (4 MG TOTAL) BY MOUTH Gonzales EIGHT (EIGHT) HOURS AS NEEDED FOR NAUSEA OR VOMITING. Cleatus Arlyss RAMAN, MD  Active Spouse/Significant Other  Med Note CATHY OVAL DEL   Thu Jul 18, 2024  3:51 PM)    tamsulosin  (FLOMAX ) 0.4 MG CAPS capsule [id number] Yes Take 1 capsule (0.4 mg total) by mouth daily. Jens Durand, MD  Active   triamcinolone  cream (KENALOG ) 0.1 % [id number] Yes Apply 1-2 times daily to aa, lower legs for up to 2 weeks. Avoid applying to face, groin, and axilla. Use as directed. Long-term  use can cause thinning of the skin. Jackquline Sawyer, MD  Active Spouse/Significant Other  Med List Note Zena Nathanael CROME, CPhT 07/18/24 1537): [place] [phone] 07/18/24            Home Care and Equipment/Supplies: Were Home Health Services Ordered?: Yes Name of Home Health Agency:: [facility] Has Agency set up a time to come to your home?: Yes First Home Health Visit Date: 07/23/24 Any new equipment or medical supplies ordered?: NA  Functional Questionnaire: Do you need assistance with bathing/showering or dressing?: No Do you need assistance with meal preparation?: Yes Do you need assistance with eating?: No Do you have difficulty maintaining continence: No Do you need assistance with getting out of bed/getting out of a chair/moving?: No Do you have difficulty managing or taking your medications?: No  Follow up appointments reviewed: PCP Follow-up appointment confirmed?: No MD Provider Line Number:[phone] Given: Yes (wife will make appointment) Specialist Hospital Follow-up appointment confirmed?: Yes Date of Specialist follow-up appointment?: 07/25/24 Follow-Up Specialty Provider:: [name] Do you need transportation to your follow-up appointment?: No Do you understand care options if your condition(s) worsen?: Yes-patient verbalized understanding  SDOH Interventions Today    Flowsheet Row Most Recent Value  SDOH Interventions   Food Insecurity Interventions Intervention Not Indicated  Housing Interventions Intervention Not Indicated  Transportation Interventions Intervention Not Indicated, Patient Resources (Friends/Family)  Utilities Interventions Intervention Not Indicated    Cathlean Headland BSN RN [facility] Health Care Management Coordinator [email or web address] Direct Dial: [phone]  Fax: [phone] Website: [email or web address]

## 2024-07-24 ENCOUNTER — Encounter: Payer: Self-pay | Admitting: Internal Medicine

## 2024-07-24 ENCOUNTER — Ambulatory Visit (INDEPENDENT_AMBULATORY_CARE_PROVIDER_SITE_OTHER): Admitting: Internal Medicine

## 2024-07-24 VITALS — BP 110/80 | HR 67 | Temp 98.2°F | Ht 70.0 in | Wt 235.6 lb

## 2024-07-24 DIAGNOSIS — J9601 Acute respiratory failure with hypoxia: Secondary | ICD-10-CM

## 2024-07-24 DIAGNOSIS — K080 Exfoliation of teeth due to systemic causes: Secondary | ICD-10-CM | POA: Diagnosis not present

## 2024-07-24 DIAGNOSIS — G4733 Obstructive sleep apnea (adult) (pediatric): Secondary | ICD-10-CM

## 2024-07-24 DIAGNOSIS — J9602 Acute respiratory failure with hypercapnia: Secondary | ICD-10-CM

## 2024-07-24 NOTE — Progress Notes (Signed)
 Name: Todd Gonzales MRN: [id number] DOB: Sep 05, 1941    CHIEF COMPLAINT:  EXCESSIVE DAYTIME SLEEPINESS   HISTORY OF PRESENT ILLNESS: Patient is seen today for problems and issues with sleep related to excessive daytime sleepiness Patient  has been having sleep problems for many years Patient has been having excessive daytime sleepiness for a long time Patient has been having extreme fatigue and tiredness, lack of energy +  very Loud snoring every night + struggling breathe at night and gasps for air + morning headaches + Nonrefreshing sleep  Discussed sleep data and reviewed with patient.  Encouraged proper weight management.  Discussed driving precautions and its relationship with hypersomnolence.  Discussed operating dangerous equipment and its relationship with hypersomnolence.  Discussed sleep hygiene, and benefits of a fixed sleep waked time.  The importance of getting eight or more hours of sleep discussed with patient.  Discussed limiting the use of the computer and television before bedtime.  Decrease naps during the day, so night time sleep will become enhanced.  Limit caffeine, and sleep deprivation.  HTN, stroke, and heart failure are potential risk factors.       04/24/2024   10:00 AM  Results of the Epworth flowsheet  Sitting and reading 3  Watching TV 3  Sitting, inactive in a public place (e.g. a theatre or a meeting) 3  As a passenger in a car for an hour without a break 3  Lying down to rest in the afternoon when circumstances permit 2  Sitting and talking to someone 3  Sitting quietly after a lunch without alcohol 3  In a car, while stopped for a few minutes in traffic 1  Total score 21   No exacerbation at this time No evidence of heart failure at this time No evidence or signs of infection at this time No respiratory distress No fevers, chills, nausea, vomiting, diarrhea No evidence of lower extremity edema No evidence hemoptysis    Non-smoker Nonalcoholic Retired Neurosurgeon company   Cardiac history Atrial fibrillation Previously on amiodarone  now on beta-blockers  On anticoagulation with Eliquis  Lasix  therapy   PAST MEDICAL HISTORY :   has a past medical history of Actinic keratosis (03/28/2023), Acute hypoxemic respiratory failure (HCC), Aortic atherosclerosis (HCC), Arthritis, Basal cell carcinoma (03/28/2023), BPH (benign prostatic hyperplasia), CAD (coronary artery disease), Chronic heart failure with preserved ejection fraction (HFpEF) (HCC), COPD (chronic obstructive pulmonary disease) (HCC), Grade I diastolic dysfunction, H/O hiatal hernia, Hemorrhoids, History of cardioversion, Hyperlipidemia, Hypertension, IGT (impaired glucose tolerance), Incomplete right bundle branch block (RBBB), Mitral regurgitation, Obesity (BMI 30-39.9), Obstructive sleep apnea, OSA on CPAP, OSA treated with BiPAP, Paroxysmal atrial fibrillation (HCC) (04/14/2023), Paroxysmal atrial fibrillation (HCC), SCC (squamous cell carcinoma) (02/10/2005), SCC (squamous cell carcinoma) (02/12/2024), SCCA (squamous cell carcinoma) of skin (02/10/2005), SCCA (squamous cell carcinoma) of skin (02/13/2006), SCCA (squamous cell carcinoma) of skin (04/05/2006), SCCA (squamous cell carcinoma) of skin (07/06/2010), SCCA (squamous cell carcinoma) of skin (04/27/2009), SCCA (squamous cell carcinoma) of skin (08/28/2014), SCCA (squamous cell carcinoma) of skin (03/21/2016), SCCA (squamous cell carcinoma) of skin (01/19/2017), SCCA (squamous cell carcinoma) of skin (03/04/1840), SCCA (squamous cell carcinoma) of skin (12/23/2020), SCCA (squamous cell carcinoma) of skin (12/23/2020), Seasonal allergies, Spinal stenosis, Squamous cell carcinoma of skin (08/31/2020), Squamous cell carcinoma of skin (04/27/2021), Squamous cell carcinoma of skin (03/28/2023), Squamous cell carcinoma of skin (08/16/2023), Squamous cell carcinoma of skin (05/28/2024), Squamous  cell carcinoma of skin of face (04/27/2021), and UTI (lower urinary tract infection).  has  a past surgical history that includes Back surgery (2008); Knee arthroscopy; Total hip arthroplasty (03/20/2012); Joint replacement (Bilateral); Lumbar laminectomy/decompression microdiscectomy (Bilateral, 11/20/2018); TEE without cardioversion (N/A, 12/22/2023); and Cardioversion (N/A, 12/22/2023). Prior to Admission medications   Medication Sig Start Date End Date Taking? Authorizing Provider  acetaminophen  (TYLENOL ) 650 MG CR tablet Take 1,300 mg by mouth 2 (two) times daily as needed for pain.    [provider]  amiodarone  (PACERONE ) 200 MG tablet Take 200 mg by mouth daily.    [provider]  amoxicillin  (AMOXIL ) 500 MG capsule Take 1,000 mg by mouth 2 (two) times daily. 03/27/24   [provider]  ascorbic acid  (VITAMIN C ) 500 MG tablet Take 1 tablet (500 mg total) by mouth daily. 12/31/23   Amin, Sumayya, MD  Camphor-Menthol -Methyl Sal (SALONPAS EX) Apply 1 patch topically daily as needed (pain).    [provider]  cyanocobalamin  (VITAMIN B12) 1000 MCG/ML injection Inject 1 mL (1,000 mcg total) into the muscle every 30 (thirty) days. 01/26/23   Cleatus Arlyss RAMAN, MD  diclofenac  Sodium (VOLTAREN ) 1 % GEL Apply 1 Application topically every 6 (six) hours as needed (pain). 09/05/16   [provider]  ELIQUIS  5 MG TABS tablet TAKE ONE TABLET BY MOUTH TWICE A DAY 02/09/24   Cleatus Arlyss RAMAN, MD  famotidine  (PEPCID ) 20 MG tablet Take 1 tablet (20 mg total) by mouth 2 (two) times daily. 10/03/23   Stacia Glendia BRAVO, MD  fluorouracil  (EFUDEX ) 5 % cream Apply topically 2 (two) times daily. Bid 5-7 days to temples, cheeks, forehead and nose 02/12/24   Jackquline Sawyer, MD  furosemide  (LASIX ) 20 MG tablet Take 1 tablet (20 mg total) by mouth daily as needed for fluid. 10/31/23 01/29/24  Cleatus Arlyss RAMAN, MD  gabapentin  (NEURONTIN ) 300 MG capsule TAKE ONE CAPSULE BY MOUTH ONCE DAILY  10/25/23   Cleatus Arlyss RAMAN, MD  hydrocortisone  (PROCTOSOL HC) 2.5 % rectal cream Place 1 Application rectally 2 (two) times daily as needed for hemorrhoids or anal itching. Home med. 01/08/24   Awanda City, MD  metoprolol  tartrate (LOPRESSOR ) 25 MG tablet Held as of 03/12/24 03/12/24   Cleatus Arlyss RAMAN, MD  mometasone  (ELOCON ) 0.1 % cream Spot treat thicker areas on lower legs once to twice daily until improved. Avoid face, groin, axilla. 05/03/23   Jackquline Sawyer, MD  ondansetron  (ZOFRAN ) 4 MG tablet TAKE ONE TABLET (4 MG TOTAL) BY MOUTH EVERY EIGHT (EIGHT) HOURS AS NEEDED FOR NAUSEA OR VOMITING. 11/01/23   Cleatus Arlyss RAMAN, MD  OVER THE COUNTER MEDICATION Place 1-2 sprays into the nose daily as needed (allergies, nasal congestion). Unknown nasal spray.    [provider]  polyethylene glycol (MIRALAX  / GLYCOLAX ) 17 g packet Take 17 g by mouth daily as needed for mild constipation. 04/16/23   Krishnan, Gokul, MD  tamsulosin  (FLOMAX ) 0.4 MG CAPS capsule TAKE TWO CAPSULE BY MOUTH DAILY 04/02/24   Cleatus Arlyss RAMAN, MD  triamcinolone  cream (KENALOG ) 0.1 % Apply 1 Application topically daily. qd to bid aa itchy rash on legs for up to 2 weeks, avoid face, groin, axilla 02/12/24   Jackquline Sawyer, MD   Allergies  Allergen Reactions   Codeine Other (See Comments)    Unknown reaction   Lipitor [Atorvastatin ] Other (See Comments)    Arthralgias    Bactrim [Sulfamethoxazole-Trimethoprim] Rash    FAMILY HISTORY:  family history includes Arthritis in his mother; Asthma in his father; Lung cancer in his father. SOCIAL HISTORY:  reports  that he has never smoked. He has never used smokeless tobacco. He reports current alcohol use. He reports that he does not use drugs.   Review of Systems:  Gen:  Denies  fever, sweats, chills weight loss  HEENT: Denies blurred vision, double vision, ear pain, eye pain, hearing loss, nose bleeds, sore throat Cardiac:  No dizziness, chest pain or heaviness, chest  tightness,edema, No JVD Resp:   No cough, -sputum production, -shortness of breath,-wheezing, -hemoptysis,  Gi: Denies swallowing difficulty, stomach pain, nausea or vomiting, diarrhea, constipation, bowel incontinence Gu:  Denies bladder incontinence, burning urine Ext:   Denies Joint pain, stiffness or swelling Skin: Denies  skin rash, easy bruising or bleeding or hives Endoc:  Denies polyuria, polydipsia , polyphagia or weight change Psych:   Denies depression, insomnia or hallucinations  Other:  All other systems negative   ALL OTHER ROS ARE NEGATIVE   BP 110/80 (BP Location: Right Arm, Patient Position: Sitting, Cuff Size: Large)   Pulse 67   Temp 98.2 F (36.8 C) (Oral)   Ht 5' 10 (1.778 m)   Wt 235 lb 9.6 oz (106.9 kg)   SpO2 96%   BMI 33.81 kg/m     Physical Examination:   General Appearance: No distress  EYES PERRLA, EOM intact.   NECK Supple, No JVD ORAL CAVITY MALLAMPATI 4 Pulmonary: normal breath sounds, No wheezing.  CardiovascularNormal S1,S2.  No m/r/g.   Abdomen: Benign, Soft, non-tender. Skin:   warm, no rashes, no ecchymosis  Extremities: normal, no cyanosis, clubbing. Neuro:without focal findings,  speech normal  PSYCHIATRIC: Mood, affect within normal limits.   ALL OTHER ROS ARE NEGATIVE    ASSESSMENT AND PLAN SYNOPSIS  Patient with signs and symptoms of excessive daytime sleepiness with probable underlying diagnosis of obstructive sleep apnea in the setting of obesity and deconditioned state   Recommend HOME Sleep Study for definitve diagnosis  Obesity -recommend significant weight loss -recommend changing diet  Deconditioned state -Recommend increased daily activity and exercise   Atrial Fibrillation - Sleep apnea can contribute to Atrial Fibrillation, therefore treatment of sleep apnea is important part of A fib management.   MEDICATION ADJUSTMENTS/LABS AND TESTS ORDERED: Recommend Sleep Study Recommend weight  loss   CURRENT MEDICATIONS REVIEWED AT LENGTH WITH PATIENT TODAY   Patient  satisfied with Plan of action and management. All questions answered  Follow up  3 months   I spent a total of  68 minutes reviewing chart data, face-to-face evaluation with the patient, counseling and coordination of care as detailed above.    Nickolas Alm Cellar, M.D.  [facility]  Medical Director [facility] Medical Director [facility] Cardio-Pulmonary Department

## 2024-07-24 NOTE — Progress Notes (Signed)
 Name: Todd Gonzales MRN: [id number] DOB: October 06, 1941    CHIEF COMPLAINT:  Follow up assessment of severe apnea   HISTORY OF PRESENT ILLNESS: Patient started on auto CPAP however was noncompliant Patient subsequently was admitted to the hospital for acute heart failure A-fib with RVR Patient had aggressive Lasix  therapy and will was up 11 pounds In the hospital patient was started on BiPAP which significantly helped his symptoms Patient found to have hypercapnic respiratory failure chronic elevated CO2 levels  Patient is here for follow-up assessment for severe sleep apnea AHI is 80 I recommend BiPAP titration study in the sleep lab  No exacerbation at this time No evidence of heart failure at this time No evidence or signs of infection at this time No respiratory distress No fevers, chills, nausea, vomiting, diarrhea No evidence of lower extremity edema No evidence hemoptysis Non-smoker 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   Cardiac history Atrial fibrillation Previously on amiodarone  now on beta-blockers  On anticoagulation with Eliquis  Lasix  therapy   PAST MEDICAL HISTORY :   has a past medical history of Actinic keratosis (03/28/2023), Acute hypoxemic respiratory failure (HCC), Aortic atherosclerosis (HCC), Arthritis, Basal cell carcinoma (03/28/2023), BPH (benign prostatic hyperplasia), CAD (coronary artery disease), Chronic heart failure with preserved ejection fraction (HFpEF) (HCC), COPD (chronic obstructive pulmonary disease) (HCC), Grade I diastolic dysfunction, H/O hiatal hernia, Hemorrhoids, History of cardioversion, Hyperlipidemia, Hypertension, IGT (impaired glucose tolerance), Incomplete right bundle branch block (RBBB), Mitral regurgitation, Obesity (BMI 30-39.9), Obstructive sleep apnea, OSA on CPAP, OSA treated with BiPAP, Paroxysmal atrial fibrillation (HCC) (04/14/2023), Paroxysmal atrial fibrillation (HCC), SCC (squamous cell carcinoma)  (02/10/2005), SCC (squamous cell carcinoma) (02/12/2024), SCCA (squamous cell carcinoma) of skin (02/10/2005), SCCA (squamous cell carcinoma) of skin (02/13/2006), SCCA (squamous cell carcinoma) of skin (04/05/2006), SCCA (squamous cell carcinoma) of skin (07/06/2010), SCCA (squamous cell carcinoma) of skin (04/27/2009), SCCA (squamous cell carcinoma) of skin (08/28/2014), SCCA (squamous cell carcinoma) of skin (03/21/2016), SCCA (squamous cell carcinoma) of skin (01/19/2017), SCCA (squamous cell carcinoma) of skin (03/04/1840), SCCA (squamous cell carcinoma) of skin (12/23/2020), SCCA (squamous cell carcinoma) of skin (12/23/2020), Seasonal allergies, Spinal stenosis, Squamous cell carcinoma of skin (08/31/2020), Squamous cell carcinoma of skin (04/27/2021), Squamous cell carcinoma of skin (03/28/2023), Squamous cell carcinoma of skin (08/16/2023), Squamous cell carcinoma of skin (05/28/2024), Squamous cell carcinoma of skin of face (04/27/2021), and UTI (lower urinary tract infection).  has a past surgical history that includes Back surgery (2008); Knee arthroscopy; Total hip arthroplasty (03/20/2012); Joint replacement (Bilateral); Lumbar laminectomy/decompression microdiscectomy (Bilateral, 11/20/2018); TEE without cardioversion (N/A, 12/22/2023); and Cardioversion (N/A, 12/22/2023). Prior to Admission medications   Medication Sig Start Date End Date Taking? Authorizing Provider  acetaminophen  (TYLENOL ) 650 MG CR tablet Take 1,300 mg by mouth 2 (two) times daily as needed for pain.    [provider]  amiodarone  (PACERONE ) 200 MG tablet Take 200 mg by mouth daily.    [provider]  amoxicillin  (AMOXIL ) 500 MG capsule Take 1,000 mg by mouth 2 (two) times daily. 03/27/24   [provider]  ascorbic acid  (VITAMIN C ) 500 MG tablet Take 1 tablet (500 mg total) by mouth daily. 12/31/23   Amin, Sumayya, MD  Camphor-Menthol -Methyl Sal (SALONPAS EX) Apply 1 patch topically daily as needed  (pain).    [provider]  cyanocobalamin  (VITAMIN B12) 1000 MCG/ML injection Inject 1 mL (1,000 mcg total) into the muscle every 30 (thirty) days. 01/26/23   Cleatus Arlyss RAMAN, MD  diclofenac  Sodium (VOLTAREN ) 1 %  GEL Apply 1 Application topically every 6 (six) hours as needed (pain). 09/05/16   [provider]  ELIQUIS  5 MG TABS tablet TAKE ONE TABLET BY MOUTH TWICE A DAY 02/09/24   Cleatus Arlyss RAMAN, MD  famotidine  (PEPCID ) 20 MG tablet Take 1 tablet (20 mg total) by mouth 2 (two) times daily. 10/03/23   Stacia Glendia BRAVO, MD  fluorouracil  (EFUDEX ) 5 % cream Apply topically 2 (two) times daily. Bid 5-7 days to temples, cheeks, forehead and nose 02/12/24   Jackquline Sawyer, MD  furosemide  (LASIX ) 20 MG tablet Take 1 tablet (20 mg total) by mouth daily as needed for fluid. 10/31/23 01/29/24  Cleatus Arlyss RAMAN, MD  gabapentin  (NEURONTIN ) 300 MG capsule TAKE ONE CAPSULE BY MOUTH ONCE DAILY 10/25/23   Cleatus Arlyss RAMAN, MD  hydrocortisone  (PROCTOSOL HC) 2.5 % rectal cream Place 1 Application rectally 2 (two) times daily as needed for hemorrhoids or anal itching. Home med. 01/08/24   Awanda City, MD  metoprolol  tartrate (LOPRESSOR ) 25 MG tablet Held as of 03/12/24 03/12/24   Cleatus Arlyss RAMAN, MD  mometasone  (ELOCON ) 0.1 % cream Spot treat thicker areas on lower legs once to twice daily until improved. Avoid face, groin, axilla. 05/03/23   Jackquline Sawyer, MD  ondansetron  (ZOFRAN ) 4 MG tablet TAKE ONE TABLET (4 MG TOTAL) BY MOUTH EVERY EIGHT (EIGHT) HOURS AS NEEDED FOR NAUSEA OR VOMITING. 11/01/23   Cleatus Arlyss RAMAN, MD  OVER THE COUNTER MEDICATION Place 1-2 sprays into the nose daily as needed (allergies, nasal congestion). Unknown nasal spray.    [provider]  polyethylene glycol (MIRALAX  / GLYCOLAX ) 17 g packet Take 17 g by mouth daily as needed for mild constipation. 04/16/23   Krishnan, Gokul, MD  tamsulosin  (FLOMAX ) 0.4 MG CAPS capsule TAKE TWO CAPSULE BY MOUTH DAILY 04/02/24   Cleatus Arlyss RAMAN, MD  triamcinolone  cream (KENALOG ) 0.1 % Apply 1 Application topically daily. qd to bid aa itchy rash on legs for up to 2 weeks, avoid face, groin, axilla 02/12/24   Jackquline Sawyer, MD   Allergies  Allergen Reactions   Codeine Other (See Comments)    Unknown reaction   Lipitor [Atorvastatin ] Other (See Comments)    Arthralgias    Bactrim [Sulfamethoxazole-Trimethoprim] Rash    FAMILY HISTORY:  family history includes Arthritis in his mother; Asthma in his father; Lung cancer in his father. SOCIAL HISTORY:  reports that he has never smoked. He has never used smokeless tobacco. He reports current alcohol use. He reports that he does not use drugs.  BP 110/80 (BP Location: Right Arm, Patient Position: Sitting, Cuff Size: Large)   Pulse 67   Temp 98.2 F (36.8 C) (Oral)   Ht 5' 10 (1.778 m)   Wt 235 lb 9.6 oz (106.9 kg)   SpO2 96%   BMI 33.81 kg/m      Review of Systems: Gen:  Denies  fever, sweats, chills weight loss  HEENT: Denies blurred vision, double vision, ear pain, eye pain, hearing loss, nose bleeds, sore throat Cardiac:  No dizziness, chest pain or heaviness, chest tightness,edema, No JVD Resp:   No cough, -sputum production, -shortness of breath,-wheezing, -hemoptysis,  Other:  All other systems negative   Physical Examination:   General Appearance: No distress  EYES PERRLA, EOM intact.   NECK Supple, No JVD Pulmonary: normal breath sounds, No wheezing.  CardiovascularNormal S1,S2.  No m/r/g.   Abdomen: Benign, Soft, non-tender. Neurology UE/LE 5/5 strength, no focal deficits Ext pulses intact,  cap refill intact ALL OTHER ROS ARE NEGATIVE    ASSESSMENT AND PLAN SYNOPSIS  83 year old pleasant white male with significant cardiac issues with underlying congestive heart failure with A-fib RVR with a history of severe sleep apnea with AHI of 80 with hypercapnic and hypoxic respiratory failure at night  Severe sleep apnea Recommend BiPAP titration  study and lab a soon as possible  Congestive heart failure A-fib RVR Continue medications as prescribed Follow-up with cardiology soon as possible  Obesity -recommend significant weight loss -recommend changing diet  Deconditioned state -Recommend increased daily activity and exercise  Atrial Fibrillation - Sleep apnea can contribute to Atrial Fibrillation, therefore treatment of sleep apnea is important part of A fib management.   MEDICATION ADJUSTMENTS/LABS AND TESTS ORDERED: Recommend BiPAP titration sleep study and lab Avoid Allergens and Irritants Avoid secondhand smoke Avoid SICK contacts Recommend  Masking  when appropriate Recommend Keep up-to-date with vaccinations   CURRENT MEDICATIONS REVIEWED AT LENGTH WITH PATIENT TODAY   Patient  satisfied with Plan of action and management. All questions answered   Follow up 3 months   I spent a total of 45 minutes dedicated to the care of this patient on the date of this encounter to include pre-visit review of records, face-to-face time with the patient discussing conditions above, post visit ordering of testing, clinical documentation with the electronic health record, making appropriate referrals as documented, and communicating necessary information to the patient's healthcare team.    The Patient requires high complexity decision making for assessment and support, frequent evaluation and titration of therapies, application of advanced monitoring technologies and extensive interpretation of multiple databases.  Patient satisfied with Plan of action and management. All questions answered    Nickolas Alm Cellar, M.D.  [facility]  Medical Director [facility] Medical Director [facility] Cardio-Pulmonary Department

## 2024-07-24 NOTE — Patient Instructions (Signed)
 Recommend BiPAP titration study in the Lab ASAP

## 2024-07-25 ENCOUNTER — Encounter: Payer: Self-pay | Admitting: Nurse Practitioner

## 2024-07-25 ENCOUNTER — Ambulatory Visit: Attending: Nurse Practitioner | Admitting: Nurse Practitioner

## 2024-07-25 VITALS — BP 120/64 | HR 83 | Ht 70.0 in | Wt 237.1 lb

## 2024-07-25 DIAGNOSIS — I480 Paroxysmal atrial fibrillation: Secondary | ICD-10-CM

## 2024-07-25 DIAGNOSIS — I5032 Chronic diastolic (congestive) heart failure: Secondary | ICD-10-CM | POA: Diagnosis not present

## 2024-07-25 DIAGNOSIS — E785 Hyperlipidemia, unspecified: Secondary | ICD-10-CM | POA: Diagnosis not present

## 2024-07-25 DIAGNOSIS — I10 Essential (primary) hypertension: Secondary | ICD-10-CM

## 2024-07-25 DIAGNOSIS — I2510 Atherosclerotic heart disease of native coronary artery without angina pectoris: Secondary | ICD-10-CM

## 2024-07-25 MED ORDER — AMIODARONE HCL 200 MG PO TABS: ORAL_TABLET | ORAL | 0 refills | Status: DC

## 2024-07-25 NOTE — Patient Instructions (Signed)
 Medication Instructions:  AMIODARONE : Take 200 mg twice daily for one week and then take 200 mg once daily.   *If you need a refill on your cardiac medications before your next appointment, please call your pharmacy*  Lab Work: None ordered If you have labs (blood work) drawn today and your tests are completely normal, you will receive your results only by: MyChart Message (if you have MyChart) OR A paper copy in the mail If you have any lab test that is abnormal or we need to change your treatment, we will call you to review the results.  Testing/Procedures: None ordered  Follow-Up: At [facility], you and your health needs are our priority.  As part of our continuing mission to provide you with exceptional heart care, our providers are all part of one team.  This team includes your primary Cardiologist (physician) and Advanced Practice Providers or APPs (Physician Assistants and Nurse Practitioners) who all work together to provide you with the care you need, when you need it.  Your next appointment:   3 week(s)  Provider:   Alm Clay, MD or [name], NP    We recommend signing up for the patient portal called MyChart.  Sign up information is provided on this After Visit Summary.  MyChart is used to connect with patients for Virtual Visits (Telemedicine).  Patients are able to view lab/test results, encounter notes, upcoming appointments, etc.  Non-urgent messages can be sent to your provider as well.   To learn more about what you can do with MyChart, go to [email or web address].

## 2024-07-25 NOTE — Progress Notes (Signed)
 Office Visit    Patient Name: Todd Gonzales Date of Encounter: 07/25/2024  Primary Care Provider:  Cleatus Arlyss RAMAN, MD Primary Cardiologist:  Alm Clay, MD    Chief Complaint    83 y.o. male with a history of paroxysmal atrial fibrillation, hypertension, hyperlipidemia, and nonobstructive coronary artery disease, who presents for follow-up related to HFpEF and atrial fibrillation.   Past Medical History   Subjective   Past Medical History:  Diagnosis Date   Actinic keratosis 03/28/2023   glabella, needs LN2   Acute hypoxemic respiratory failure (HCC)    Aortic atherosclerosis (HCC)    a. Noted on CT 02/2023.   Arthritis    Basal cell carcinoma 03/28/2023   right paranasal, no tx, cleared with bx   BPH (benign prostatic hyperplasia)    nocturia x4 at baseline   CAD (coronary artery disease)    a. 08/2023 Cor CTA: LM nl, LAD 25-49, LCX <25, RCA 25-49. Ca2+ = 409 (43rd %'ile).   Chronic heart failure with preserved ejection fraction (HFpEF) (HCC)    a. 04/2023 Echo: EF 60-65%, no rwma, mild LVH, nl RV fxn, triv MR; b. 12/2023 TEE: EF 60-65%, nl RV size/fxn, mildly dil LA, no LA/LAA thrombus, mild-mod MR, mild AI; c. 07/2014 Echo: EF 60-65%, no rwma, GrI DD, nl RV fxn.   COPD (chronic obstructive pulmonary disease) (HCC)    Grade I diastolic dysfunction    H/O hiatal hernia    Hemorrhoids    History of cardioversion    Hyperlipidemia    Hypertension    Dr Tisovec- LOV with clearance 3/13 on chart   IGT (impaired glucose tolerance)    last AIC 5.5- diet controlled- per office note 3/13   Incomplete right bundle branch block (RBBB)    Mitral regurgitation    a. 12/2023 TEE: mild to mod MR in setting of afib.   Obesity (BMI 30-39.9)    Obstructive sleep apnea    OSA on CPAP    OSA treated with BiPAP    Paroxysmal atrial fibrillation (HCC) 04/14/2023   a. 04/2023 Admitted with A-fib RVR.  Normal echo.  Discharged on amiodarone ; b. 05/2023 Zio: Predominantly sinus rhythm  at 66 (50-88).  First-degree AV block.  4 beats of NSVT.  No sustained arrhythmias.  Amiodarone  discontinued; c. 12/2023 recurrent Afib->amio->TEE/DCCV.   Paroxysmal atrial fibrillation (HCC)    SCC (squamous cell carcinoma) 02/10/2005   scc in situ-left forearm.   SCC (squamous cell carcinoma) 02/12/2024   L lateral upper pretibia, EDC   SCCA (squamous cell carcinoma) of skin 02/10/2005   Right Outer Eye (in situ) (curet and excision)   SCCA (squamous cell carcinoma) of skin 02/13/2006   Right Ear (well diff) (curet and 5FU)   SCCA (squamous cell carcinoma) of skin 04/05/2006   Left Ear Bowl (in situ) (tx p bx)   SCCA (squamous cell carcinoma) of skin 07/06/2010   Right Cheek (in situ) (curet and zyclara)   SCCA (squamous cell carcinoma) of skin 04/27/2009   Left Upper Forearm (in situ) (curet 5FU)   SCCA (squamous cell carcinoma) of skin 08/28/2014   Right Cheek (well diff)   SCCA (squamous cell carcinoma) of skin 03/21/2016   Left Jawline Ant (well diff) (curet and 5FU)   SCCA (squamous cell carcinoma) of skin 01/19/2017   Right Hand (in situ) (tx p bx)   SCCA (squamous cell carcinoma) of skin 03/04/1840   Left Jawline Inf (in situ) (curet and 5FU)  SCCA (squamous cell carcinoma) of skin 12/23/2020   Left Forehead (well diff) (tx p bx)   SCCA (squamous cell carcinoma) of skin 12/23/2020   Left Submandibular Area (in situ) (tx p bx)   Seasonal allergies    Spinal stenosis    Squamous cell carcinoma of skin 08/31/2020   in situ-left jawline cheek   Squamous cell carcinoma of skin 04/27/2021   infil- left nasal sidewall(MOHS)   Squamous cell carcinoma of skin 03/28/2023   Left medial cheek above nasolabial, not treated, but cleared with bx   Squamous cell carcinoma of skin 08/16/2023   Left lateral upper pretibia, EDC   Squamous cell carcinoma of skin 05/28/2024   SCC IS,  Left Upper Temple, [facility] 06/10/24   Squamous cell carcinoma of skin of face 04/27/2021   infil- Left  Temporal scalp (MOHS   UTI (lower urinary tract infection)    required admission to [facility]   Past Surgical History:  Procedure Laterality Date   BACK SURGERY  2008   Laminectomy L4-5   CARDIOVERSION N/A 12/22/2023   Procedure: CARDIOVERSION;  Surgeon: Darliss Rogue, MD;  Location: [facility] ORS;  Service: Cardiovascular;  Laterality: N/A;   JOINT REPLACEMENT Bilateral    bil   KNEE ARTHROSCOPY     right   LUMBAR LAMINECTOMY/DECOMPRESSION MICRODISCECTOMY Bilateral 11/20/2018   Procedure: Bilateral Thoracic Twelve to Lumbar One Laminectomy;  Surgeon: Colon Shove, MD;  Location: [facility] OR;  Service: Neurosurgery;  Laterality: Bilateral;  posterior   TEE WITHOUT CARDIOVERSION N/A 12/22/2023   Procedure: TRANSESOPHAGEAL ECHOCARDIOGRAM (TEE);  Surgeon: Darliss Rogue, MD;  Location: [facility] ORS;  Service: Cardiovascular;  Laterality: N/A;   TOTAL HIP ARTHROPLASTY  03/20/2012   Procedure: TOTAL HIP ARTHROPLASTY ANTERIOR APPROACH;  Surgeon: Donnice JONETTA Car, MD;  Location: [facility] ORS;  Service: Orthopedics;  Laterality: Left;    Allergies  Allergies  Allergen Reactions   Codeine Other (See Comments)    Unknown reaction   Lipitor [Atorvastatin ] Other (See Comments)    Arthralgias    Bactrim [Sulfamethoxazole-Trimethoprim] Rash       History of Present Illness      83 y.o. y/o male with a history of paroxysmal atrial fibrillation, hypertension, hyperlipidemia, and nonobstructive coronary artery disease.  Patient was hospitalized at [facility] in May 2024 in the setting of dyspnea and fatigue.  He was also found to be in atrial fibrillation/flutter with PVCs and rapid ventricular rate.  He was initially placed on diltiazem  and subsequently amiodarone  with conversion to sinus rhythm.  Echo showed normal LV function.  He was given a dose of IV Lasix .  He was discharged home on Eliquis  and establish care with Dr. Anner in June 2024.  Amiodarone  was discontinued and he was placed on metoprolol  25 mg twice  daily.  Subsequent ZIO monitoring showed predominantly sinus rhythm with a first-degree AV block and an average heart rate of 66.  No sustained arrhythmias or recurrent A-fib noted.   In August 2024, patient required titration of diuretic therapy in the setting of worsening dyspnea and lower extremity swelling.  Coronary CT angiogram in September 2024, showed a calcium  score of 409 (43rd percentile), and mild to moderate nonobstructive LAD, RCA, and circumflex disease.  Patient was admitted in January 2025 with dyspnea, hypoxia, acute on chronic HFpEF, and A-fib with RVR.  He was placed on amiodarone  and subsequently underwent TEE and cardioversion.  Amiodarone  was subsequently discontinued as planned in April 2025.     Mr. Ironside was last seen  in cardiology clinic in May 2025, at which time he had lower extremity edema and noted an increase in abdominal girth.  Weight was 10 pounds above prior hospital discharge weight from earlier in the year and we discussed the use of as needed Lasix  for weights above 230 pounds.  He required admission last week due to myalgias, fatigue, worsening dyspnea, and edema.  He was not using his Lasix  due to frequent urination.  He was hypoxic on arrival at 70%.  Chest x-ray showed CHF.  Troponins were normal.  He initially required BiPAP and was aggressively diuresed.  Follow-up echo showed EF of 60 to 65% with grade 1 diastolic dysfunction.  He was subsequently discharged home on Lasix  20 mg daily.  Since recent hospital discharge, weights have been stable around 233 pounds on his home scale in the mornings.  He notes compliance with Lasix  20 mg daily and has not been having any significant edema.  He has been feeling fatigued, which he attributes to having been in the hospital.  He uses a Kardia mobile device periodically and this morning, was notified that he is in atrial fibrillation.  He is asymptomatic.  He has some degree of chronic, stable dyspnea on exertion.  He  denies chest pain, palpitations, PND, orthopnea, dizziness, syncope, edema, or early satiety.  He has amiodarone  at home and took 400 mg this morning. Objective   Home Medications    Current Outpatient Medications  Medication Sig Dispense Refill   acetaminophen  (TYLENOL ) 650 MG CR tablet Take 1,300 mg by mouth 2 (two) times daily as needed for pain.     amoxicillin  (AMOXIL ) 500 MG capsule Take 2,000 mg by mouth once. Prior to dentist.     cyanocobalamin  (VITAMIN B12) 1000 MCG/ML injection Inject 1 mL (1,000 mcg total) into the muscle every 30 (thirty) days.     diclofenac  Sodium (VOLTAREN ) 1 % GEL Apply 1 Application topically every 6 (six) hours as needed (pain).     ELIQUIS  5 MG TABS tablet TAKE ONE TABLET BY MOUTH TWICE A DAY 60 tablet 2   fluorouracil  (EFUDEX ) 5 % cream Apply topically 2 (two) times daily. Bid 5-7 days to temples, cheeks, forehead and nose 30 g 2   furosemide  (LASIX ) 20 MG tablet Take 1 tablet (20 mg total) by mouth daily.     gabapentin  (NEURONTIN ) 300 MG capsule TAKE ONE CAPSULE BY MOUTH ONCE DAILY 90 capsule 1   hydrocortisone  (PROCTOSOL HC) 2.5 % rectal cream Place 1 Application rectally 2 (two) times daily as needed for hemorrhoids or anal itching. Home med.     methylcellulose (CITRUCEL) oral powder Take 1 packet by mouth daily.     ondansetron  (ZOFRAN ) 4 MG tablet TAKE ONE TABLET (4 MG TOTAL) BY MOUTH EVERY EIGHT (EIGHT) HOURS AS NEEDED FOR NAUSEA OR VOMITING. 20 tablet 1   tamsulosin  (FLOMAX ) 0.4 MG CAPS capsule Take 1 capsule (0.4 mg total) by mouth daily.     triamcinolone  cream (KENALOG ) 0.1 % Apply 1-2 times daily to aa, lower legs for up to 2 weeks. Avoid applying to face, groin, and axilla. Use as directed. Long-term use can cause thinning of the skin. 45 g 1   amiodarone  (PACERONE ) 200 MG tablet Take 200 mg twice daily for one week, then take 200 mg once daily. 35 tablet 0   metoprolol  tartrate (LOPRESSOR ) 25 MG tablet Held as of 03/12/24 (Patient not taking:  Reported on 07/25/2024)     No current facility-administered medications for this  visit.     Physical Exam    VS:  BP 120/64 (BP Location: Left Arm, Patient Position: Sitting, Cuff Size: Normal)   Pulse 83   Ht 5' 10 (1.778 m)   Wt 237 lb 2 oz (107.6 kg)   SpO2 93%   BMI 34.02 kg/m  , BMI Body mass index is 34.02 kg/m.          GEN: Obese, in no acute distress. HEENT: normal. Neck: Supple, obese, difficult to gauge JVP.  No bruits or masses.  Cardiac: Irregularly irregular, distant, no murmurs, rubs, or gallops. No clubbing, cyanosis, trace left ankle edema.  Radials 2+/PT 2+ and equal bilaterally.  Respiratory:  Respirations regular and unlabored, clear to auscultation bilaterally. GI: Soft, nontender, nondistended, BS + x 4. MS: no deformity or atrophy. Skin: warm and dry, no rash. Neuro:  Strength and sensation are intact. Psych: Normal affect.  Accessory Clinical Findings    ECG personally reviewed by me today - EKG Interpretation Date/Time:  Thursday July 25 2024 15:04:58 EDT Ventricular Rate:  83 PR Interval:    QRS Duration:  80 QT Interval:  350 QTC Calculation: 411 R Axis:   25  Text Interpretation: Atrial fibrillation When compared with ECG of 18-Jul-2024 14:51, Confirmed by Vivienne Bruckner [phone]) on 07/25/2024 3:21:28 PM  - no acute changes.  Lab Results  Component Value Date   WBC 7.4 07/19/2024   HGB 13.1 07/19/2024   HCT 42.1 07/19/2024   MCV 101.7 (H) 07/19/2024   PLT 189 07/19/2024   Lab Results  Component Value Date   CREATININE 1.02 07/21/2024   BUN 28 (H) 07/21/2024   NA 140 07/21/2024   K 3.5 07/21/2024   CL 97 (L) 07/21/2024   CO2 33 (H) 07/21/2024   Lab Results  Component Value Date   ALT 9 07/19/2024   AST 12 (L) 07/19/2024   ALKPHOS 36 (L) 07/19/2024   BILITOT 1.8 (H) 07/19/2024   Lab Results  Component Value Date   CHOL 109 04/16/2023   HDL 41 04/16/2023   LDLCALC 58 04/16/2023   TRIG 51 04/16/2023   CHOLHDL 2.7  04/16/2023    Lab Results  Component Value Date   HGBA1C 5.3 12/26/2022   Lab Results  Component Value Date   TSH 2.42 04/02/2024       Assessment & Plan    1.  Chronic heart failure with preserved ejection fraction: Recently admitted to [facility] a week ago due to progressive dyspnea, edema, and hypoxia.  Found to be volume overloaded.  Echo during admission showed EF of 60 to 65% with no regional wall motion abnormalities, grade 1 diastolic dysfunction, and aortic sclerosis.  He was aggressively diuresed and has since been taking Lasix  20 mg daily, which he is tolerating.  Weight at home scale has been trending 233 pounds.  On examination today, volume is stable with only trace left ankle edema.  He is in atrial fibrillation today, which is rate controlled at 83 bpm (see below).  Blood pressure stable.  Continue current dose of Lasix .  2.  Paroxysmal atrial fibrillation: Status post TEE and cardioversion earlier this year.  He was on amiodarone  through the month of March and then this was subsequently discontinued.  He is in atrial fibrillation today, first noted by his wife on his Crist mobile this morning.  He is asymptomatic and well rate controlled.  He took amiodarone  400 mg this morning.  I suspect that with more persistent atrial  fibrillation, he will run into issues with recurrent heart failure.  I advised him to take amiodarone  200 mg twice daily for the next week and then drop to 200 mg daily.  I will plan to see him back in approximately 2 weeks to reevaluate rhythm and determine if cardioversion will be necessary.  I suspect that going forward, he will continue to need some amount of daily amiodarone .  He remains anticoagulated with Eliquis  with recent normal H&H and renal function.  3.  Primary hypertension: Stable.  4.  Hyperlipidemia: LDL of 58 last year.  He is statin intolerant.  5.  Nonobstructive CAD: Noted on CT angiogram last year.  He is not on a statin in the  setting of prior intolerance and an LDL of 58 last year.  No aspirin  in the setting of Eliquis .  6.  Chronic hypoxic respiratory failure: Now followed by pulmonology with plan for in-lab sleep study.  7.  Disposition: Follow-up in clinic in 2 weeks.  Plan to follow-up basic metabolic panel at that time.  [name], NP 07/25/2024, 4:23 PM

## 2024-07-29 ENCOUNTER — Telehealth: Payer: Self-pay | Admitting: Family Medicine

## 2024-07-29 ENCOUNTER — Other Ambulatory Visit: Payer: Self-pay | Admitting: Family Medicine

## 2024-07-29 DIAGNOSIS — Z7901 Long term (current) use of anticoagulants: Secondary | ICD-10-CM | POA: Diagnosis not present

## 2024-07-29 DIAGNOSIS — I110 Hypertensive heart disease with heart failure: Secondary | ICD-10-CM | POA: Diagnosis not present

## 2024-07-29 DIAGNOSIS — C44320 Squamous cell carcinoma of skin of unspecified parts of face: Secondary | ICD-10-CM | POA: Diagnosis not present

## 2024-07-29 DIAGNOSIS — I2510 Atherosclerotic heart disease of native coronary artery without angina pectoris: Secondary | ICD-10-CM | POA: Diagnosis not present

## 2024-07-29 DIAGNOSIS — G4733 Obstructive sleep apnea (adult) (pediatric): Secondary | ICD-10-CM | POA: Diagnosis not present

## 2024-07-29 DIAGNOSIS — J9601 Acute respiratory failure with hypoxia: Secondary | ICD-10-CM | POA: Diagnosis not present

## 2024-07-29 DIAGNOSIS — E785 Hyperlipidemia, unspecified: Secondary | ICD-10-CM | POA: Diagnosis not present

## 2024-07-29 DIAGNOSIS — E876 Hypokalemia: Secondary | ICD-10-CM | POA: Diagnosis not present

## 2024-07-29 DIAGNOSIS — Z9181 History of falling: Secondary | ICD-10-CM | POA: Diagnosis not present

## 2024-07-29 DIAGNOSIS — I5033 Acute on chronic diastolic (congestive) heart failure: Secondary | ICD-10-CM | POA: Diagnosis not present

## 2024-07-29 DIAGNOSIS — I480 Paroxysmal atrial fibrillation: Secondary | ICD-10-CM | POA: Diagnosis not present

## 2024-07-29 DIAGNOSIS — E669 Obesity, unspecified: Secondary | ICD-10-CM | POA: Diagnosis not present

## 2024-07-29 DIAGNOSIS — Z6834 Body mass index (BMI) 34.0-34.9, adult: Secondary | ICD-10-CM | POA: Diagnosis not present

## 2024-07-29 DIAGNOSIS — I4892 Unspecified atrial flutter: Secondary | ICD-10-CM | POA: Diagnosis not present

## 2024-07-29 DIAGNOSIS — J9622 Acute and chronic respiratory failure with hypercapnia: Secondary | ICD-10-CM | POA: Diagnosis not present

## 2024-07-29 NOTE — Telephone Encounter (Signed)
 Copied from CRM #[id number]. Topic: Clinical - Request for Lab/Test Order >> Jul 29, 2024  9:54 AM Franky GRADE wrote: Reason for CRM: Terri from [facility] is calling to request an order for speech therapy to be faxed to their office. Fax number is [phone].

## 2024-07-30 ENCOUNTER — Encounter: Payer: Self-pay | Admitting: Cardiology

## 2024-07-31 NOTE — Telephone Encounter (Signed)
 Please give the order.  Thanks.

## 2024-07-31 NOTE — Telephone Encounter (Signed)
 Call placed to the patient. He was asked to recheck his blood pressure and heart rate. He did state that he has a wrist blood pressure machine that is old.   Blood pressure and heart rate was 89/49 HR 67. He has not taken his amiodarone  today. He takes the Furosemide  at night.  He is asymptomatic and his wife is at home with him.

## 2024-08-01 ENCOUNTER — Ambulatory Visit: Attending: Sleep Medicine

## 2024-08-01 DIAGNOSIS — J9601 Acute respiratory failure with hypoxia: Secondary | ICD-10-CM | POA: Diagnosis not present

## 2024-08-01 DIAGNOSIS — J9602 Acute respiratory failure with hypercapnia: Secondary | ICD-10-CM | POA: Diagnosis not present

## 2024-08-01 DIAGNOSIS — G4733 Obstructive sleep apnea (adult) (pediatric): Secondary | ICD-10-CM | POA: Insufficient documentation

## 2024-08-01 NOTE — Telephone Encounter (Addendum)
 Order faxed to [facility] for Speech therapy

## 2024-08-06 ENCOUNTER — Ambulatory Visit (INDEPENDENT_AMBULATORY_CARE_PROVIDER_SITE_OTHER)

## 2024-08-06 ENCOUNTER — Telehealth: Payer: Self-pay

## 2024-08-06 VITALS — Ht 70.0 in | Wt 237.0 lb

## 2024-08-06 DIAGNOSIS — Z6834 Body mass index (BMI) 34.0-34.9, adult: Secondary | ICD-10-CM | POA: Diagnosis not present

## 2024-08-06 DIAGNOSIS — Z9181 History of falling: Secondary | ICD-10-CM | POA: Diagnosis not present

## 2024-08-06 DIAGNOSIS — Z0000 Encounter for general adult medical examination without abnormal findings: Secondary | ICD-10-CM | POA: Diagnosis not present

## 2024-08-06 DIAGNOSIS — I5033 Acute on chronic diastolic (congestive) heart failure: Secondary | ICD-10-CM | POA: Diagnosis not present

## 2024-08-06 DIAGNOSIS — I4892 Unspecified atrial flutter: Secondary | ICD-10-CM | POA: Diagnosis not present

## 2024-08-06 DIAGNOSIS — J9622 Acute and chronic respiratory failure with hypercapnia: Secondary | ICD-10-CM | POA: Diagnosis not present

## 2024-08-06 DIAGNOSIS — J9601 Acute respiratory failure with hypoxia: Secondary | ICD-10-CM | POA: Diagnosis not present

## 2024-08-06 DIAGNOSIS — E876 Hypokalemia: Secondary | ICD-10-CM | POA: Diagnosis not present

## 2024-08-06 DIAGNOSIS — E785 Hyperlipidemia, unspecified: Secondary | ICD-10-CM | POA: Diagnosis not present

## 2024-08-06 DIAGNOSIS — C44320 Squamous cell carcinoma of skin of unspecified parts of face: Secondary | ICD-10-CM | POA: Diagnosis not present

## 2024-08-06 DIAGNOSIS — I480 Paroxysmal atrial fibrillation: Secondary | ICD-10-CM | POA: Diagnosis not present

## 2024-08-06 DIAGNOSIS — G4733 Obstructive sleep apnea (adult) (pediatric): Secondary | ICD-10-CM | POA: Diagnosis not present

## 2024-08-06 DIAGNOSIS — E669 Obesity, unspecified: Secondary | ICD-10-CM | POA: Diagnosis not present

## 2024-08-06 DIAGNOSIS — I110 Hypertensive heart disease with heart failure: Secondary | ICD-10-CM | POA: Diagnosis not present

## 2024-08-06 DIAGNOSIS — I2510 Atherosclerotic heart disease of native coronary artery without angina pectoris: Secondary | ICD-10-CM | POA: Diagnosis not present

## 2024-08-06 DIAGNOSIS — Z7901 Long term (current) use of anticoagulants: Secondary | ICD-10-CM | POA: Diagnosis not present

## 2024-08-06 NOTE — Telephone Encounter (Signed)
 I called [facility] to follow up on this referral regarding previous insurance issues they were looking into to see if able to accept this referral.  I spoke with Terri (Referrals scheduler) and she states that the patients Insurance is HMO, they do not accept and he will have to either pay out of pocket or the referral be sent elsewhere.   They have attempted contacting the patient to advise him that if he wanted to pay out of pocket then he could speak with Nathanel Benders (billing dept) to pay out of pocket (P: [phone]).  They left a detailed message on the patients VM with all this and they have not heard back. Once the patient calls them back to discuss further we can move forward.   If the patient is wanting to stay with [facility] then he will need to reach out to Nathanel Benders, otherwise we will have to send his referral to another outpatient PT that will do SLP.   Please advise, thanks.

## 2024-08-06 NOTE — Telephone Encounter (Signed)
 Copied from CRM [id number]. Topic: Clinical - Lab/Test Results >> Aug 06, 2024 11:53 AM Todd Gonzales wrote: Reason for CRM: patient would like to have someone reach out regarding sleep study results done on 08/01/24 - please reach out to home number

## 2024-08-06 NOTE — Telephone Encounter (Signed)
 Ashtyn- thank you for checking on this.   AV- please check with patient see what he wants to do so we can change the referral if needed.    Thanks.

## 2024-08-06 NOTE — Telephone Encounter (Signed)
 Patient advised to wait on results and recommendations. NFN.

## 2024-08-06 NOTE — Progress Notes (Signed)
 Subjective:   Todd Gonzales is a 83 y.o. who presents for a Medicare Wellness preventive visit.  As a reminder, Annual Wellness Visits don't include a physical exam, and some assessments may be limited, especially if this visit is performed virtually. We may recommend an in-person follow-up visit with your provider if needed.  Visit Complete: Virtual I connected with  Todd Gonzales on 08/06/24 by a audio enabled telemedicine application and verified that I am speaking with the correct person using two identifiers.  Patient Location: Home  Provider Location: Office/Clinic  I discussed the limitations of evaluation and management by telemedicine. The patient expressed understanding and agreed to proceed.  Vital Signs: Because this visit was a virtual/telehealth visit, some criteria may be missing or patient reported. Any vitals not documented were not able to be obtained and vitals that have been documented are patient reported.  VideoDeclined- This patient declined Librarian, academic. Therefore the visit was completed with audio only.  Persons Participating in Visit: Patient.  AWV Questionnaire: Yes: Patient Medicare AWV questionnaire was completed by the patient on 08/06/24; I have confirmed that all information answered by patient is correct and no changes since this date.  Cardiac Risk Factors include: advanced age (>84men, >40 women);dyslipidemia;hypertension;male gender;obesity (BMI >30kg/m2);sedentary lifestyle     Objective:    Today's Vitals   08/06/24 1140 08/06/24 1320  Weight:  237 lb (107.5 kg)  Height:  5' 10 (1.778 m)  PainSc: 8     Body mass index is 34.01 kg/m.     07/18/2024    1:32 PM 12/22/2023    8:00 AM 12/20/2023   11:13 AM 08/08/2023    3:40 PM 04/14/2023    3:30 PM 08/05/2022    8:21 AM 10/26/2021    1:46 PM  Advanced Directives  Does Patient Have a Medical Advance Directive? No;Yes No Yes Yes No No No  Type of Conservation officer, nature of [place];Living will     Copy of Healthcare Power of Attorney in Chart?    No - copy requested     Would patient like information on creating a medical advance directive?  No - Patient declined   No - Patient declined No - Patient declined No - Patient declined    Current Medications (verified) Outpatient Encounter Medications as of 08/06/2024  Medication Sig   acetaminophen  (TYLENOL ) 650 MG CR tablet Take 1,300 mg by mouth 2 (two) times daily as needed for pain.   amiodarone  (PACERONE ) 200 MG tablet Take 200 mg twice daily for one week, then take 200 mg once daily.   amoxicillin  (AMOXIL ) 500 MG capsule Take 2,000 mg by mouth once. Prior to dentist.   cyanocobalamin  (VITAMIN B12) 1000 MCG/ML injection Inject 1 mL (1,000 mcg total) into the muscle every 30 (thirty) days.   diclofenac  Sodium (VOLTAREN ) 1 % GEL Apply 1 Application topically every 6 (six) hours as needed (pain).   ELIQUIS  5 MG TABS tablet TAKE ONE TABLET BY MOUTH TWICE A DAY   fluorouracil  (EFUDEX ) 5 % cream Apply topically 2 (two) times daily. Bid 5-7 days to temples, cheeks, forehead and nose   furosemide  (LASIX ) 20 MG tablet Take 1 tablet (20 mg total) by mouth daily.   gabapentin  (NEURONTIN ) 300 MG capsule TAKE ONE CAPSULE BY MOUTH ONCE DAILY   hydrocortisone  (PROCTOSOL HC) 2.5 % rectal cream Place 1 Application rectally 2 (two) times daily as needed for hemorrhoids or anal itching. Home med.  methylcellulose (CITRUCEL) oral powder Take 1 packet by mouth daily.   metoprolol  tartrate (LOPRESSOR ) 25 MG tablet Held as of 03/12/24 (Patient not taking: Reported on 07/25/2024)   ondansetron  (ZOFRAN ) 4 MG tablet TAKE ONE TABLET (4 MG TOTAL) BY MOUTH EVERY EIGHT (EIGHT) HOURS AS NEEDED FOR NAUSEA OR VOMITING.   tamsulosin  (FLOMAX ) 0.4 MG CAPS capsule Take 1 capsule (0.4 mg total) by mouth daily.   triamcinolone  cream (KENALOG ) 0.1 % Apply 1-2 times daily to aa, lower legs for up to 2 weeks. Avoid applying  to face, groin, and axilla. Use as directed. Long-term use can cause thinning of the skin.   No facility-administered encounter medications on file as of 08/06/2024.    Allergies (verified) Codeine, Lipitor [atorvastatin ], and Bactrim [sulfamethoxazole-trimethoprim]   History: Past Medical History:  Diagnosis Date   Actinic keratosis 03/28/2023   glabella, needs LN2   Acute hypoxemic respiratory failure (HCC)    Aortic atherosclerosis (HCC)    a. Noted on CT 02/2023.   Arthritis    Basal cell carcinoma 03/28/2023   right paranasal, no tx, cleared with bx   BPH (benign prostatic hyperplasia)    nocturia x4 at baseline   CAD (coronary artery disease)    a. 08/2023 Cor CTA: LM nl, LAD 25-49, LCX <25, RCA 25-49. Ca2+ = 409 (43rd %'ile).   Chronic heart failure with preserved ejection fraction (HFpEF) (HCC)    a. 04/2023 Echo: EF 60-65%, no rwma, mild LVH, nl RV fxn, triv MR; b. 12/2023 TEE: EF 60-65%, nl RV size/fxn, mildly dil LA, no LA/LAA thrombus, mild-mod MR, mild AI; c. 07/2014 Echo: EF 60-65%, no rwma, GrI DD, nl RV fxn.   COPD (chronic obstructive pulmonary disease) (HCC)    Grade I diastolic dysfunction    H/O hiatal hernia    Hemorrhoids    History of cardioversion    Hyperlipidemia    Hypertension    Dr Tisovec- LOV with clearance 3/13 on chart   IGT (impaired glucose tolerance)    last AIC 5.5- diet controlled- per office note 3/13   Incomplete right bundle branch block (RBBB)    Mitral regurgitation    a. 12/2023 TEE: mild to mod MR in setting of afib.   Obesity (BMI 30-39.9)    Obstructive sleep apnea    OSA on CPAP    OSA treated with BiPAP    Paroxysmal atrial fibrillation (HCC) 04/14/2023   a. 04/2023 Admitted with A-fib RVR.  Normal echo.  Discharged on amiodarone ; b. 05/2023 Zio: Predominantly sinus rhythm at 66 (50-88).  First-degree AV block.  4 beats of NSVT.  No sustained arrhythmias.  Amiodarone  discontinued; c. 12/2023 recurrent Afib->amio->TEE/DCCV.   Paroxysmal  atrial fibrillation (HCC)    SCC (squamous cell carcinoma) 02/10/2005   scc in situ-left forearm.   SCC (squamous cell carcinoma) 02/12/2024   L lateral upper pretibia, EDC   SCCA (squamous cell carcinoma) of skin 02/10/2005   Right Outer Eye (in situ) (curet and excision)   SCCA (squamous cell carcinoma) of skin 02/13/2006   Right Ear (well diff) (curet and 5FU)   SCCA (squamous cell carcinoma) of skin 04/05/2006   Left Ear Bowl (in situ) (tx p bx)   SCCA (squamous cell carcinoma) of skin 07/06/2010   Right Cheek (in situ) (curet and zyclara)   SCCA (squamous cell carcinoma) of skin 04/27/2009   Left Upper Forearm (in situ) (curet 5FU)   SCCA (squamous cell carcinoma) of skin 08/28/2014   Right Cheek (well diff)  SCCA (squamous cell carcinoma) of skin 03/21/2016   Left Jawline Ant (well diff) (curet and 5FU)   SCCA (squamous cell carcinoma) of skin 01/19/2017   Right Hand (in situ) (tx p bx)   SCCA (squamous cell carcinoma) of skin 03/04/1840   Left Jawline Inf (in situ) (curet and 5FU)   SCCA (squamous cell carcinoma) of skin 12/23/2020   Left Forehead (well diff) (tx p bx)   SCCA (squamous cell carcinoma) of skin 12/23/2020   Left Submandibular Area (in situ) (tx p bx)   Seasonal allergies    Spinal stenosis    Squamous cell carcinoma of skin 08/31/2020   in situ-left jawline cheek   Squamous cell carcinoma of skin 04/27/2021   infil- left nasal sidewall(MOHS)   Squamous cell carcinoma of skin 03/28/2023   Left medial cheek above nasolabial, not treated, but cleared with bx   Squamous cell carcinoma of skin 08/16/2023   Left lateral upper pretibia, EDC   Squamous cell carcinoma of skin 05/28/2024   SCC IS,  Left Upper Temple, [facility] 06/10/24   Squamous cell carcinoma of skin of face 04/27/2021   infil- Left Temporal scalp (MOHS   UTI (lower urinary tract infection)    required admission to [facility]   Past Surgical History:  Procedure Laterality Date   BACK SURGERY  2008    Laminectomy L4-5   CARDIOVERSION N/A 12/22/2023   Procedure: CARDIOVERSION;  Surgeon: Darliss Rogue, MD;  Location: [facility] ORS;  Service: Cardiovascular;  Laterality: N/A;   JOINT REPLACEMENT Bilateral    bil   KNEE ARTHROSCOPY     right   LUMBAR LAMINECTOMY/DECOMPRESSION MICRODISCECTOMY Bilateral 11/20/2018   Procedure: Bilateral Thoracic Twelve to Lumbar One Laminectomy;  Surgeon: Colon Shove, MD;  Location: [facility] OR;  Service: Neurosurgery;  Laterality: Bilateral;  posterior   TEE WITHOUT CARDIOVERSION N/A 12/22/2023   Procedure: TRANSESOPHAGEAL ECHOCARDIOGRAM (TEE);  Surgeon: Darliss Rogue, MD;  Location: [facility] ORS;  Service: Cardiovascular;  Laterality: N/A;   TOTAL HIP ARTHROPLASTY  03/20/2012   Procedure: TOTAL HIP ARTHROPLASTY ANTERIOR APPROACH;  Surgeon: Donnice JONETTA Car, MD;  Location: [facility] ORS;  Service: Orthopedics;  Laterality: Left;   Family History  Problem Relation Age of Onset   Arthritis Mother    Lung cancer Father    Asthma Father    Colon cancer Neg Hx    Prostate cancer Neg Hx    Social History   Socioeconomic History   Marital status: Married    Spouse name: Not on file   Number of children: 2   Years of education: Not on file   Highest education level: Bachelor's degree (e.g., BA, AB, BS)  Occupational History   Occupation: retired  Tobacco Use   Smoking status: Never   Smokeless tobacco: Never  Vaping Use   Vaping status: Never Used  Substance and Sexual Activity   Alcohol use: Yes    Comment: socially, wine   Drug use: No   Sexual activity: Not on file  Other Topics Concern   Not on file  Social History Narrative   Plays bridge   [facility] [employer]     2 kids, one local.     [employer] grad.     Married 1962   Prev in Engineer, maintenance (IT), retired.     Social Drivers of Corporate investment banker Strain: Low Risk  (08/06/2024)   Overall Financial Resource Strain (CARDIA)    Difficulty of Paying Living Expenses: Not hard at all  Food  Insecurity: No Food Insecurity (08/06/2024)   Hunger Vital Sign    Worried About Running Out of Food in the Last Year: Never true    Ran Out of Food in the Last Year: Never true  Transportation Needs: No Transportation Needs (08/06/2024)   PRAPARE - Administrator, Civil Service (Medical): No    Lack of Transportation (Non-Medical): No  Physical Activity: Insufficiently Active (08/06/2024)   Exercise Vital Sign    Days of Exercise per Week: 2 days    Minutes of Exercise per Session: 30 min  Stress: No Stress Concern Present (08/06/2024)   [employer] of Occupational Health - Occupational Stress Questionnaire    Feeling of Stress: Only a little  Social Connections: Socially Isolated (08/06/2024)   Social Connection and Isolation Panel    Frequency of Communication with Friends and Family: Once a week    Frequency of Social Gatherings with Friends and Family: Once a week    Attends Religious Services: Never    Database administrator or Organizations: No    Attends Engineer, structural: Not on file    Marital Status: Married    Tobacco Counseling Counseling given: Not Answered    Clinical Intake:  Pre-visit preparation completed: No  Pain : 0-10 Pain Score: 8  Pain Type: Chronic pain Pain Location: Knee Pain Orientation: Right, Left Pain Descriptors / Indicators: Aching, Sharp, Shooting Pain Onset: More than a month ago Pain Frequency: Intermittent Pain Relieving Factors: ICE, SALON PAS, TOPICAL VOLTAREN   Pain Relieving Factors: ICE, SALON PAS, TOPICAL VOLTAREN   BMI - recorded: 34.01 Nutritional Status: BMI > 30  Obese Nutritional Risks: None Diabetes: No  Lab Results  Component Value Date   HGBA1C 5.3 12/26/2022   HGBA1C 5.2 03/26/2020     How often do you need to have someone help you when you read instructions, pamphlets, or other written materials from your doctor or pharmacy?: 1 - Never  Interpreter Needed?: No  Comments: LIVES WITH  WIFE Information entered by :: B.Rozell Theiler,LPN   Activities of Daily Living     08/06/2024   11:40 AM 07/19/2024    3:13 PM  In your present state of health, do you have any difficulty performing the following activities:  Hearing? 1   Vision? 1   Difficulty concentrating or making decisions? 0   Walking or climbing stairs? 1   Dressing or bathing? 0   Doing errands, shopping? 1 1  Comment  wife Copywriter, advertising and eating ? Y   Using the Toilet? N   In the past six months, have you accidently leaked urine? N   Do you have problems with loss of bowel control? N   Managing your Medications? N   Managing your Finances? N   Housekeeping or managing your Housekeeping? N     Patient Care Team: Cleatus Arlyss RAMAN, MD as PCP - General (Family Medicine) Anner Alm ORN, MD as PCP - Cardiology (Cardiology) Colon Shove, MD as Consulting Physician (Neurosurgery) Jane Charleston, MD as Consulting Physician (Orthopedic Surgery) Livingston Rigg, MD as Consulting Physician (Dermatology) Porter Andrez SAUNDERS, PA-C (Inactive) as Physician Assistant (Dermatology) Enola Feliciano Hugger, MD as Consulting Physician (Ophthalmology)  I have updated your Care Teams any recent Medical Services you may have received from other providers in the past year.     Assessment:   This is a routine wellness examination for [place].  Hearing/Vision screen Hearing Screening - Comments:: Patient denies any hearing  difficulties.   Vision Screening - Comments:: Pt says their vision is good without glasses;readers only Dr  Enola   Goals Addressed             This Visit's Progress    DIET - EAT MORE FRUITS AND VEGETABLES   On track    Patient Stated   Not on track    Go back to the gym       Depression Screen     08/06/2024    1:24 PM 07/23/2024    1:01 PM 08/08/2023    3:35 PM 04/20/2023   10:36 AM 04/07/2023   12:27 PM 03/24/2023   11:03 AM 08/05/2022    8:19 AM  PHQ 2/9 Scores  PHQ - 2 Score 0  0  0 0 0 0  PHQ- 9 Score    0 0 0 0  Exception Documentation  Other- indicate reason in comment box         Fall Risk     08/06/2024   11:40 AM 08/08/2023   10:38 AM 04/20/2023   10:35 AM 04/07/2023   12:27 PM 03/24/2023   11:03 AM  Fall Risk   Falls in the past year? 1 0 0 0 0  Number falls in past yr: 0 0 0 0 0  Injury with Fall? 1 0 0 0 0  Risk for fall due to : Orthopedic patient No Fall Risks History of fall(s) No Fall Risks No Fall Risks  Follow up Falls prevention discussed;Education provided Falls prevention discussed;Falls evaluation completed Falls evaluation completed Falls evaluation completed Falls evaluation completed    MEDICARE RISK AT HOME:  Medicare Risk at Home Any stairs in or around the home?: (Patient-Rptd) No Home free of loose throw rugs in walkways, pet beds, electrical cords, etc?: (Patient-Rptd) Yes Adequate lighting in your home to reduce risk of falls?: (Patient-Rptd) Yes Life alert?: (Patient-Rptd) Yes Use of a cane, walker or w/c?: (Patient-Rptd) Yes Grab bars in the bathroom?: (Patient-Rptd) Yes Shower chair or bench in shower?: (Patient-Rptd) Yes Elevated toilet seat or a handicapped toilet?: (Patient-Rptd) Yes  TIMED UP AND GO:  Was the test performed?  No  Cognitive Function: 6CIT completed        08/06/2024    1:25 PM 08/08/2023    3:42 PM 08/05/2022    8:22 AM  6CIT Screen  What Year? 0 points  0 points  What month? 0 points 0 points 0 points  What time? 0 points 0 points 0 points  Count back from 20 0 points 0 points 0 points  Months in reverse 0 points 0 points 0 points  Repeat phrase 0 points 0 points 0 points  Total Score 0 points  0 points    Immunizations Immunization History  Administered Date(s) Administered    sv, Bivalent, Protein Subunit Rsvpref,pf [place]) 10/18/2022, 09/26/2023   Fluad Quad(high Dose 65+) 08/27/2020, 09/19/2022, 09/27/2023   Influenza Inj Mdck Quad Pf 09/05/2016   Influenza, Quadrivalent, Recombinant, Inj,  Pf 09/21/2018, 09/17/2019   Influenza-Unspecified 09/17/2019, 09/05/2023   Moderna Sars-Covid-2 Vaccination 01/01/2020, 02/06/2020, 10/10/2020, 04/17/2021   PFIZER Comirnaty(Gray Top)Covid-19 Tri-Sucrose Vaccine 09/26/2023   Pfizer Covid-19 Vaccine Bivalent Booster 109yrs & up 09/22/2021   Pfizer(Comirnaty)Fall Seasonal Vaccine 12 years and older 10/18/2022   Pneumococcal Conjugate-13 08/25/2014, 09/05/2016   Pneumococcal Polysaccharide-23 03/23/2009   Respiratory Syncytial Virus Vaccine,Recomb Aduvanted(Arexvy) 10/18/2022   Td 02/17/2014   Tdap 10/26/2021   Zoster, Live 03/23/2009    Screening Tests Health Maintenance  Topic Date Due   Zoster Vaccines- Shingrix (1 of 2) 01/20/1960   INFLUENZA VACCINE  07/05/2024   COVID-19 Vaccine (8 - 2025-26 season) 08/05/2024   Medicare Annual Wellness (AWV)  08/06/2025   DTaP/Tdap/Td (3 - Td or Tdap) 10/27/2031   Pneumococcal Vaccine: 50+ Years  Completed   HPV VACCINES  Aged Out   Meningococcal B Vaccine  Aged Out    Health Maintenance  Health Maintenance Due  Topic Date Due   Zoster Vaccines- Shingrix (1 of 2) 01/20/1960   INFLUENZA VACCINE  07/05/2024   COVID-19 Vaccine (8 - 2025-26 season) 08/05/2024   Health Maintenance Items Addressed: None due at this time. Pt will receive vaccines at their pharmcy when decided to obtain   Additional Screening:  Vision Screening: Recommended annual ophthalmology exams for early detection of glaucoma and other disorders of the eye. Would you like a referral to an eye doctor? No    Dental Screening: Recommended annual dental exams for proper oral hygiene  Community Resource Referral / Chronic Care Management: CRR required this visit?  No   CCM required this visit?  No pt declined to schedule:sts he will have his wife to do later   Plan:    I have personally reviewed and noted the following in the patient's chart:   Medical and social history Use of alcohol, tobacco or illicit drugs   Current medications and supplements including opioid prescriptions. Patient is not currently taking opioid prescriptions. Functional ability and status Nutritional status Physical activity Advanced directives List of other physicians Hospitalizations, surgeries, and ER visits in previous 12 months Vitals Screenings to include cognitive, depression, and falls Referrals and appointments  In addition, I have reviewed and discussed with patient certain preventive protocols, quality metrics, and best practice recommendations. A written personalized care plan for preventive services as well as general preventive health recommendations were provided to patient.   Erminio LITTIE Saris, LPN   0/06/7973   After Visit Summary: (MyChart) Due to this being a telephonic visit, the after visit summary with patients personalized plan was offered to patient via MyChart   Notes: Nothing significant to report at this time.

## 2024-08-06 NOTE — Patient Instructions (Signed)
 Todd Gonzales , Thank you for taking time out of your busy schedule to complete your Annual Wellness Visit with me. I enjoyed our conversation and look forward to speaking with you again next year. I, as well as your care team,  appreciate your ongoing commitment to your health goals. Please review the following plan we discussed and let me know if I can assist you in the future. Your Game plan/ To Do List    Referrals: If you haven't heard from the office you've been referred to, please reach out to them at the phone provided.   Follow up Visits: We will see or speak with you next year for your Next Medicare AWV with our clinical staff Have you seen your provider in the last 6 months (3 months if uncontrolled diabetes)? Yes  Clinician Recommendations:  Aim for 30 minutes of exercise or brisk walking, 6-8 glasses of water, and 5 servings of fruits and vegetables each day.       This is a list of the screenings recommended for you:  Health Maintenance  Topic Date Due   Zoster (Shingles) Vaccine (1 of 2) 01[birth year]20/1960   Flu Shot  07/05/2024   COVID-19 Vaccine (8 - 2025-26 season) 08/05/2024   Medicare Annual Wellness Visit  08/06/2025   DTaP/Tdap/Td vaccine (3 - Td or Tdap) 10/27/2031   Pneumococcal Vaccine for age over 45  Completed   HPV Vaccine  Aged Out   Meningitis B Vaccine  Aged Out    Advanced directives: (Copy Requested) Please bring a copy of your health care power of attorney and living will to the office to be added to your chart at your convenience. You can mail to [facility] [address] [place], [place] [address] or email to [email or web address] Advance Care Planning is important because it:  [x]  Makes sure you receive the medical care that is consistent with your values, goals, and preferences  [x]  It provides guidance to your family and loved ones and reduces their decisional burden about whether or not they are making the right decisions based on  your wishes.  Follow the link provided in your after visit summary or read over the paperwork we have mailed to you to help you started getting your Advance Directives in place. If you need assistance in completing these, please reach out to us  so that we can help you!

## 2024-08-07 DIAGNOSIS — M170 Bilateral primary osteoarthritis of knee: Secondary | ICD-10-CM | POA: Diagnosis not present

## 2024-08-07 NOTE — Telephone Encounter (Signed)
 Left voicemail for patient to return call to office.

## 2024-08-08 ENCOUNTER — Other Ambulatory Visit: Payer: Self-pay | Admitting: Family Medicine

## 2024-08-08 NOTE — Telephone Encounter (Signed)
 This is prescribed by another provider do you want to prescribe?

## 2024-08-09 ENCOUNTER — Ambulatory Visit: Payer: Self-pay

## 2024-08-09 NOTE — Telephone Encounter (Signed)
 Left voicemail for patient to return call to office.

## 2024-08-09 NOTE — Addendum Note (Signed)
 Addended by: Makailey Hodgkin J on: 08/09/2024 08:52 AM   Modules accepted: Orders

## 2024-08-09 NOTE — Telephone Encounter (Signed)
 Please verify current dose (0.4 vs 0.8mg  per day) and effect with patient and let me know.  Thanks.

## 2024-08-11 NOTE — Telephone Encounter (Signed)
Thank you.  Please let me know what you hear. 

## 2024-08-12 ENCOUNTER — Encounter: Payer: Self-pay | Admitting: Family Medicine

## 2024-08-12 DIAGNOSIS — I480 Paroxysmal atrial fibrillation: Secondary | ICD-10-CM | POA: Diagnosis not present

## 2024-08-12 DIAGNOSIS — C44320 Squamous cell carcinoma of skin of unspecified parts of face: Secondary | ICD-10-CM | POA: Diagnosis not present

## 2024-08-12 DIAGNOSIS — Z6834 Body mass index (BMI) 34.0-34.9, adult: Secondary | ICD-10-CM | POA: Diagnosis not present

## 2024-08-12 DIAGNOSIS — Z7901 Long term (current) use of anticoagulants: Secondary | ICD-10-CM | POA: Diagnosis not present

## 2024-08-12 DIAGNOSIS — I5033 Acute on chronic diastolic (congestive) heart failure: Secondary | ICD-10-CM | POA: Diagnosis not present

## 2024-08-12 DIAGNOSIS — I2510 Atherosclerotic heart disease of native coronary artery without angina pectoris: Secondary | ICD-10-CM | POA: Diagnosis not present

## 2024-08-12 DIAGNOSIS — J9601 Acute respiratory failure with hypoxia: Secondary | ICD-10-CM | POA: Diagnosis not present

## 2024-08-12 DIAGNOSIS — E669 Obesity, unspecified: Secondary | ICD-10-CM | POA: Diagnosis not present

## 2024-08-12 DIAGNOSIS — E876 Hypokalemia: Secondary | ICD-10-CM | POA: Diagnosis not present

## 2024-08-12 DIAGNOSIS — E785 Hyperlipidemia, unspecified: Secondary | ICD-10-CM | POA: Diagnosis not present

## 2024-08-12 DIAGNOSIS — J9622 Acute and chronic respiratory failure with hypercapnia: Secondary | ICD-10-CM | POA: Diagnosis not present

## 2024-08-12 DIAGNOSIS — G4733 Obstructive sleep apnea (adult) (pediatric): Secondary | ICD-10-CM | POA: Diagnosis not present

## 2024-08-12 DIAGNOSIS — Z9181 History of falling: Secondary | ICD-10-CM | POA: Diagnosis not present

## 2024-08-12 DIAGNOSIS — I4892 Unspecified atrial flutter: Secondary | ICD-10-CM | POA: Diagnosis not present

## 2024-08-12 DIAGNOSIS — I110 Hypertensive heart disease with heart failure: Secondary | ICD-10-CM | POA: Diagnosis not present

## 2024-08-12 NOTE — Telephone Encounter (Signed)
 Unable to reach patient.  Letter sent.

## 2024-08-13 ENCOUNTER — Ambulatory Visit: Admitting: Dermatology

## 2024-08-13 ENCOUNTER — Other Ambulatory Visit: Payer: Self-pay | Admitting: Family Medicine

## 2024-08-13 NOTE — Telephone Encounter (Signed)
 Called patient regarding heart rate and medications. Patient states he has been taking amiodarone  once daily and at different times of the day. Patient instructed to take medication in the morning typically around the same time each day to receive optimal effects. Told patient that [name] recommends taking amiodarone  200 twice daily until follow up appointment on 9/19. Patient verbalized understanding and will take one amiodarone  in the morning and one at night before bed until follow up appointment. No more questions at this time.

## 2024-08-13 NOTE — Telephone Encounter (Signed)
 Copied from CRM [id number]. Topic: Clinical - Medication Refill >> Aug 13, 2024  1:33 PM Thersia C wrote: Medication: tamsulosin  (FLOMAX ) 0.4 MG CAPS capsule  Has the patient contacted their pharmacy? Yes (Agent: If no, request that the patient contact the pharmacy for the refill. If patient does not wish to contact the pharmacy document the reason why and proceed with request.) (Agent: If yes, when and what did the pharmacy advise?)  This is the patient's preferred pharmacy:  [facility] - [place], [place] - [address] [place] [address] Phone: [phone] Fax: [phone]  Is this the correct pharmacy for this prescription? Yes If no, delete pharmacy and type the correct one.   Has the prescription been filled recently? No  Is the patient out of the medication? Yes  Has the patient been seen for an appointment in the last year OR does the patient have an upcoming appointment? Yes  Can we respond through MyChart? Yes  Agent: Please be advised that Rx refills may take up to 3 business days. We ask that you follow-up with your pharmacy.

## 2024-08-14 ENCOUNTER — Telehealth: Payer: Self-pay

## 2024-08-14 NOTE — Telephone Encounter (Signed)
 Copied from CRM #[id number]. Topic: Clinical - Medication Question >> Aug 14, 2024 11:29 AM Rea ORN wrote: Reason for CRM: Pt called to check status of Tamsulosin  request. Pt stated he has been out of medication for 3 days.

## 2024-08-14 NOTE — Telephone Encounter (Signed)
 Sent. Thanks.

## 2024-08-14 NOTE — Telephone Encounter (Signed)
 Patient notified that prescription has been sent

## 2024-08-14 NOTE — Telephone Encounter (Signed)
 Spoke with wife. Appointment scheduled with Suzann Riddle, NP on 08/19/24.

## 2024-08-14 NOTE — Telephone Encounter (Unsigned)
 Copied from CRM [id number]. Topic: Clinical - Prescription Issue >> Aug 14, 2024  4:10 PM Burnard DEL wrote: Reason for CRM: [facility] called in with questions in regards tamsulosin  (FLOMAX ) 0.4 MG CAPS capsule prescription that was sent in for patient today.They are wanting to know that they prescription instructions are correct due to the previous dosage instructions being different?   [facility] - [place], [place] - [address]  Phone: [phone] Fax: [phone]

## 2024-08-15 ENCOUNTER — Ambulatory Visit: Admit: 2024-08-15 | Admitting: Ophthalmology

## 2024-08-15 SURGERY — PHACOEMULSIFICATION, CATARACT, WITH IOL INSERTION
Anesthesia: Topical | Laterality: Right

## 2024-08-15 NOTE — Telephone Encounter (Signed)
 Spoke with [employer] and confirmed the dosage

## 2024-08-15 NOTE — Telephone Encounter (Signed)
 According to patient, he is/has been taking 0.4mg  per day.  Please update pharmacy.  Thanks for the call.

## 2024-08-16 NOTE — Progress Notes (Signed)
 Electrophysiology Clinic Note    Date:  08/19/2024  Patient ID:  Todd Gonzales, Todd Gonzales 09/03/1941, MRN [id number] PCP:  Cleatus Arlyss RAMAN, MD  Cardiologist:  Alm Clay, MD   Cardiology APP:  Vivienne Lonni Ingle, NP     Discussed the use of AI scribe software for clinical note transcription with the patient, who gave verbal consent to proceed.   Patient Profile    Chief Complaint: AFib  History of Present Illness: Todd Gonzales is a 83 y.o. male with PMH notable for parox Afib, HTN, HLD, non-obs CAD, HFpEF; seen today for None for acute visit due to Afib.    He was hospitalized 07/2024 for CHF exacerbation, was not taking PRN lasix . He saw NP Vivienne 8/21 for hospital follow-up where he was in afib. He had taken 200mg  amiodarone  x 2 earlier that morning, was relatively asymptomatic, but worried about further CHF exacerbation w arrhythmias. Berge increased amio to 200mg  BID x 1 week, then reduce to 200mg  with plans to discuss DCCV if arrhythmia persisted.   Patient's wife messaged clinic 9/9 that he had converted back to Afib, and was feeling weak and recommended sooner appt to discuss.   On follow-up today, he is not aware of his arrhythmia, denies palpitations, chest pain. His weight has stayed stable. He is taking lasix  PRN with robust urine output. Does not desire to take daily because of robust UOP.  Wife checks heart rhythm regularly using kardia mobile, has consistently read possible AFib for many days.   He is taking amiodarone , usually BID, sometimes missed morning doses, same with eliquis . When he feels unwell he sleeps very deeply and does not wake up for morning doses of medications. He denies bleeding concerns on eliquis .   His wife joins for appointments.      Arrhythmia/Device History Amiodarone     ROS:  Please see the history of present illness. All other systems are reviewed and otherwise negative.    Physical Exam    VS:  BP 112/84 (BP Location:  Left Arm, Patient Position: Sitting, Cuff Size: Normal)   Pulse 75   Resp 18   Ht 5' 11 (1.803 m)   Wt 241 lb (109.3 kg)   SpO2 95%   BMI 33.61 kg/m  BMI: Body mass index is 33.61 kg/m.      Wt Readings from Last 3 Encounters:  08/19/24 241 lb (109.3 kg)  08/06/24 237 lb (107.5 kg)  07/25/24 237 lb 2 oz (107.6 kg)     GEN- The patient is well appearing, alert and oriented x 3 today.   Lungs- Clear to ausculation bilaterally, normal work of breathing.  Heart- Irregularly irregular rate and rhythm, no murmurs, rubs or gallops Extremities- 1+ peripheral edema, warm, dry   Studies Reviewed   Previous EP, cardiology notes.    EKG is ordered. Personal review of EKG from today shows:    EKG Interpretation Date/Time:  Monday August 19 2024 09:21:32 EDT Ventricular Rate:  76 PR Interval:    QRS Duration:  88 QT Interval:  394 QTC Calculation: 443 R Axis:   36  Text Interpretation: Atrial flutter with variable A-V block Low voltage QRS Confirmed by Kyrstan Gotwalt [phone]) on 08/19/2024 9:25:06 AM     TTE, 07/19/2024  1. Left ventricular ejection fraction, by estimation, is 60 to 65%. Left ventricular ejection fraction by PLAX is 63 %. The left ventricle has normal function. The left ventricle has no regional wall motion abnormalities. Left  ventricular diastolic parameters are consistent with Grade I diastolic dysfunction (impaired relaxation).   2. Right ventricular systolic function is normal. The right ventricular size is normal.   3. The mitral valve is normal in structure. No evidence of mitral valve regurgitation.   4. The aortic valve is calcified. Aortic valve regurgitation is not visualized. Aortic valve sclerosis/calcification is present, without any evidence of aortic stenosis. Aortic valve mean gradient measures 9.8 mmHg.   5. The inferior vena cava is dilated in size with >50% respiratory variability, suggesting right atrial pressure of 8 mmHg.   Coronary CT,  08/28/2023 1. Coronary calcium  score of 409. This was 43rd percentile for age and sex matched control.  2. Normal coronary origin with right dominance.  3. Mild LAD and RCA stenosis (25-49%).  4. Minimal LCx stenosis (<25%).  5. CAD-RADS 2. Mild non-obstructive CAD (25-49%). Consider non-atherosclerotic causes of chest pain. Consider preventive therapy and risk factor modification.   Assessment and Plan     #) parox AFib #) amiodarone  monitoring With patient's CHF, rhythm management is indicated Recommend he continue to take amiodarone  BID until DCCV Update CMP, thyroid  labs today   #) Hypercoag d/t  afib CHA2DS2-VASc Score = at least 5 [CHF History: 1, HTN History: 1, Diabetes History: 0, Stroke History: 0, Vascular Disease History: 1, Age Score: 2, Gender Score: 0].  Therefore, the patient's annual risk of stroke is 7.2 %.    Stroke ppx - 5mg  eliquis  BID, appropriately dosed No bleeding concerns Long discussion regarding dosing of eliquis  and need to take BID to prevent CVA TEE prior to DCCV  #) HFpEF Relatively euvolemic on exam with 1+ lower extreity edema, stable weight Recommend he take lasix  daily as prescribed   Informed Consent   Shared Decision Making/Informed Consent   The risks [stroke, cardiac arrhythmias rarely resulting in the need for a temporary or permanent pacemaker, skin irritation or burns, esophageal damage, perforation (1:10,000 risk), bleeding, pharyngeal hematoma as well as other potential complications associated with conscious sedation including aspiration, arrhythmia, respiratory failure and death], benefits (treatment guidance, restoration of normal sinus rhythm, diagnostic support) and alternatives of a transesophageal echocardiogram guided cardioversion were discussed in detail with Todd Gonzales and he is willing to proceed.      Current medicines are reviewed at length with the patient today.   The patient does not have concerns regarding his  medicines.  The following changes were made today:  none  Labs/ tests ordered today include:  Orders Placed This Encounter  Procedures   Magnesium    T4   TSH   CBC   Comprehensive metabolic panel with GFR   EKG 87-Ozji     Disposition: Follow up with EP APP or Gen cards APP as usual post procedure   Signed, Chantal Needle, NP  08/19/24  10:46 AM  Electrophysiology [facility]

## 2024-08-19 ENCOUNTER — Encounter: Payer: Self-pay | Admitting: Cardiology

## 2024-08-19 ENCOUNTER — Ambulatory Visit: Attending: Cardiology | Admitting: Cardiology

## 2024-08-19 VITALS — BP 112/84 | HR 75 | Resp 18 | Ht 71.0 in | Wt 241.0 lb

## 2024-08-19 DIAGNOSIS — D6869 Other thrombophilia: Secondary | ICD-10-CM

## 2024-08-19 DIAGNOSIS — I480 Paroxysmal atrial fibrillation: Secondary | ICD-10-CM | POA: Diagnosis not present

## 2024-08-19 DIAGNOSIS — Z5181 Encounter for therapeutic drug level monitoring: Secondary | ICD-10-CM | POA: Diagnosis not present

## 2024-08-19 DIAGNOSIS — Z79899 Other long term (current) drug therapy: Secondary | ICD-10-CM

## 2024-08-19 DIAGNOSIS — Z01810 Encounter for preprocedural cardiovascular examination: Secondary | ICD-10-CM | POA: Diagnosis not present

## 2024-08-19 NOTE — Patient Instructions (Signed)
 Medication Instructions:  Your physician recommends that you continue on your current medications as directed. Please refer to the Current Medication list given to you today.   *If you need a refill on your cardiac medications before your next appointment, please call your pharmacy*  Lab Work: Your provider would like for you to have following labs drawn today CBC, CMet, Magnesium , TSH, T4.   If you have labs (blood work) drawn today and your tests are completely normal, you will receive your results only by: MyChart Message (if you have MyChart) OR A paper copy in the mail If you have any lab test that is abnormal or we need to change your treatment, we will call you to review the results.  Testing/Procedures:     Dear Todd Gonzales  You are scheduled for a TEE (Transesophageal Echocardiogram) Guided Cardioversion on Tuesday, September 16 with Dr. Argentina.  Please arrive at the [facility] Entrance of [facility], [address], [place] [address] at 11:00 AM (This is 1 hour(s) prior to your procedure time).  Proceed to the Check-In Desk directly inside the entrance.  Procedure Parking: Use the entrance off of the [address] side of the hospital. Turn right upon entering and follow the driveway to parking that is directly in front of the [facility] Vascular Center. There is no valet parking available at this entrance, however there is an awning directly in front of the Heart & Vascular Center for drop off/ pick up for patients.    DIET:  Nothing to eat or drink after midnight except a sip of water with medications (see medication instructions below)  MEDICATION INSTRUCTIONS: !!IF ANY NEW MEDICATIONS ARE STARTED AFTER TODAY, PLEASE NOTIFY YOUR PROVIDER AS SOON AS POSSIBLE!!  FYI: Medications such as Semaglutide (Ozempic, [place]), Tirzepatide (Mounjaro, Zepbound), Dulaglutide (Trulicity), etc (GLP1 agonists) AND Canagliflozin (Invokana), Dapagliflozin (Farxiga), Empagliflozin  (Jardiance), Ertugliflozin (Steglatro), Bexagliflozin [employer]) or any combination with one of these drugs such as Invokamet (Canagliflozin/Metformin), Synjardy (Empagliflozin/Metformin), etc (SGLT2 inhibitors) must be held around the time of a procedure. This is not a comprehensive list of all of these drugs. Please review all of your medications and talk to your provider if you take any  Continue taking your anticoagulant (blood thinner): Apixaban  (Eliquis ).  You will need to continue this after your procedure until you are told by your provider that it is safe to stop.    LABS:   Labs will be collected on 08/19/24  FYI:  For your safety, and to allow us  to monitor your vital signs accurately during the surgery/procedure we request: If you have artificial nails, gel coating, SNS etc, please have those removed prior to your surgery/procedure. Not having the nail coverings /polish removed may result in cancellation or delay of your surgery/procedure.  Your support person will be asked to wait in the waiting room during your procedure.  It is OK to have someone drop you off and come back when you are ready to be discharged.  You cannot drive after the procedure and will need someone to drive you home.  Bring your insurance cards.  *Special Note: Every effort is made to have your procedure done on time. Occasionally there are emergencies that occur at the hospital that may cause delays. Please be patient if a delay does occur.      Follow-Up: At [facility], you and your health needs are our priority.  As part of our continuing mission to provide you with  exceptional heart care, our providers are all part of one team.  This team includes your primary Cardiologist (physician) and Advanced Practice Providers or APPs (Physician Assistants and Nurse Practitioners) who all work together to provide you with the care you need, when you need it.  Your next appointment:   2 - 4  week(s)  Provider:   [name], NP or Chantal Needle, NP    We recommend signing up for the patient portal called MyChart.  Sign up information is provided on this After Visit Summary.  MyChart is used to connect with patients for Virtual Visits (Telemedicine).  Patients are able to view lab/test results, encounter notes, upcoming appointments, etc.  Non-urgent messages can be sent to your provider as well.   To learn more about what you can do with MyChart, go to [email or web address].

## 2024-08-20 ENCOUNTER — Ambulatory Visit: Admitting: Certified Registered"

## 2024-08-20 ENCOUNTER — Encounter: Admission: RE | Disposition: A | Discharge status: Home / Self Care | Payer: Self-pay | Source: Home / Self Care

## 2024-08-20 ENCOUNTER — Ambulatory Visit: Payer: Self-pay | Admitting: Cardiology

## 2024-08-20 ENCOUNTER — Ambulatory Visit: Admitting: Dermatology

## 2024-08-20 ENCOUNTER — Other Ambulatory Visit: Payer: Self-pay

## 2024-08-20 ENCOUNTER — Ambulatory Visit: Admission: RE | Admit: 2024-08-20 | Source: Home / Self Care

## 2024-08-20 ENCOUNTER — Ambulatory Visit: Admission: RE | Admit: 2024-08-20 | Discharge: 2024-08-20 | Disposition: A | Discharge status: Home / Self Care

## 2024-08-20 DIAGNOSIS — I4892 Unspecified atrial flutter: Secondary | ICD-10-CM | POA: Diagnosis not present

## 2024-08-20 DIAGNOSIS — G4730 Sleep apnea, unspecified: Secondary | ICD-10-CM | POA: Insufficient documentation

## 2024-08-20 DIAGNOSIS — I5032 Chronic diastolic (congestive) heart failure: Secondary | ICD-10-CM | POA: Insufficient documentation

## 2024-08-20 DIAGNOSIS — J988 Other specified respiratory disorders: Secondary | ICD-10-CM | POA: Diagnosis not present

## 2024-08-20 DIAGNOSIS — Z539 Procedure and treatment not carried out, unspecified reason: Secondary | ICD-10-CM | POA: Insufficient documentation

## 2024-08-20 DIAGNOSIS — I110 Hypertensive heart disease with heart failure: Secondary | ICD-10-CM | POA: Insufficient documentation

## 2024-08-20 DIAGNOSIS — J449 Chronic obstructive pulmonary disease, unspecified: Secondary | ICD-10-CM | POA: Diagnosis not present

## 2024-08-20 DIAGNOSIS — R0902 Hypoxemia: Secondary | ICD-10-CM | POA: Diagnosis not present

## 2024-08-20 DIAGNOSIS — I4891 Unspecified atrial fibrillation: Secondary | ICD-10-CM | POA: Insufficient documentation

## 2024-08-20 HISTORY — PX: CARDIOVERSION: SHX1299

## 2024-08-20 HISTORY — PX: TEE WITHOUT CARDIOVERSION: SHX5443

## 2024-08-20 LAB — CBC
Hematocrit: 41.9 % (ref 37.5–51.0)
Hemoglobin: 13.8 g/dL (ref 13.0–17.7)
MCH: 31.8 pg (ref 26.6–33.0)
MCHC: 32.9 g/dL (ref 31.5–35.7)
MCV: 97 fL (ref 79–97)
Platelets: 158 x10E3/uL (ref 150–450)
RBC: 4.34 x10E6/uL (ref 4.14–5.80)
RDW: 11.6 % (ref 11.6–15.4)
WBC: 7.5 x10E3/uL (ref 3.4–10.8)

## 2024-08-20 LAB — COMPREHENSIVE METABOLIC PANEL WITH GFR
ALT: 13 IU/L (ref 0–44)
AST: 9 IU/L (ref 0–40)
Albumin: 3.9 g/dL (ref 3.7–4.7)
Alkaline Phosphatase: 59 IU/L (ref 48–129)
BUN/Creatinine Ratio: 13 (ref 10–24)
BUN: 14 mg/dL (ref 8–27)
Bilirubin Total: 1 mg/dL (ref 0.0–1.2)
CO2: 26 mmol/L (ref 20–29)
Calcium: 8.5 mg/dL — ABNORMAL LOW (ref 8.6–10.2)
Chloride: 101 mmol/L (ref 96–106)
Creatinine, Ser: 1.04 mg/dL (ref 0.76–1.27)
Globulin, Total: 2.2 g/dL (ref 1.5–4.5)
Glucose: 89 mg/dL (ref 70–99)
Potassium: 4.5 mmol/L (ref 3.5–5.2)
Sodium: 142 mmol/L (ref 134–144)
Total Protein: 6.1 g/dL (ref 6.0–8.5)
eGFR: 70 mL/min/1.73 (ref >59)

## 2024-08-20 LAB — TSH: TSH: 2.48 u[IU]/mL (ref 0.450–4.500)

## 2024-08-20 LAB — T4: T4, Total: 7.6 ug/dL (ref 4.5–12.0)

## 2024-08-20 LAB — MAGNESIUM: Magnesium: 2.3 mg/dL (ref 1.6–2.3)

## 2024-08-20 SURGERY — ECHOCARDIOGRAM, TRANSESOPHAGEAL
Anesthesia: General

## 2024-08-20 MED ORDER — BUTAMBEN-TETRACAINE-BENZOCAINE 2-2-14 % EX AERO
INHALATION_SPRAY | CUTANEOUS | Status: AC
  Filled 2024-08-20: qty 5

## 2024-08-20 MED ORDER — LIDOCAINE VISCOUS HCL 2 % MT SOLN
OROMUCOSAL | Status: AC
  Filled 2024-08-20: qty 15

## 2024-08-20 MED ORDER — PHENYLEPHRINE HCL (PRESSORS) 10 MG/ML IV SOLN
INTRAVENOUS | Status: DC | PRN
  Administered 2024-08-20: 16 ug via INTRAVENOUS

## 2024-08-20 MED ORDER — SODIUM CHLORIDE 0.9 % IV SOLN: INTRAVENOUS | Status: DC

## 2024-08-20 MED ORDER — PHENYLEPHRINE 80 MCG/ML (10ML) SYRINGE FOR IV PUSH (FOR BLOOD PRESSURE SUPPORT)
PREFILLED_SYRINGE | INTRAVENOUS | Status: DC | PRN
  Administered 2024-08-20: 160 ug via INTRAVENOUS

## 2024-08-20 MED ORDER — PROPOFOL 10 MG/ML IV BOLUS
INTRAVENOUS | Status: DC | PRN
  Administered 2024-08-20: 50 mg via INTRAVENOUS
  Administered 2024-08-20: 20 mg via INTRAVENOUS

## 2024-08-20 MED ORDER — PROPOFOL 10 MG/ML IV BOLUS
INTRAVENOUS | Status: AC
  Filled 2024-08-20: qty 40

## 2024-08-20 NOTE — Transfer of Care (Signed)
 Immediate Anesthesia Transfer of Care Note  Patient: Todd Gonzales  Procedure(s) Performed: ECHOCARDIOGRAM, TRANSESOPHAGEAL CARDIOVERSION  Patient Location: Cath Lab and Nursing Unit  Anesthesia Type:General  Level of Consciousness: awake  Airway & Oxygen Therapy: Patient Spontanous Breathing and Patient connected to nasal cannula oxygen  Post-op Assessment: Report given to RN and Post -op Vital signs reviewed and stable  Post vital signs: Reviewed and stable  Last Vitals:  Vitals Value Taken Time  BP 118/66 08/20/24 13:01  Temp 36.6 C 08/20/24 13:00  Pulse 70 08/20/24 13:05  Resp 23 08/20/24 13:06  SpO2 100 % 08/20/24 13:05  Vitals shown include unfiled device data.  Last Pain:  Vitals:   08/20/24 1300  TempSrc: Temporal  PainSc:          Complications:  Encounter Notable Events  Notable Event Outcome Phase Comment  Airway obstruction requiring support  Intraprocedure OAW placed  Desaturation < 90% for over 3 min or < 80% for over 1 min  Intraprocedure BVM initited to recover O2 saturation to 90s

## 2024-08-20 NOTE — CV Procedure (Signed)
   TRANSESOPHAGEAL ECHOCARDIOGRAM GUIDED DIRECT CURRENT CARDIOVERSION  NAME:  Todd Gonzales    MRN: [id number] DOB:  1941/01/03    ADMIT DATE: 08/20/2024  INDICATIONS: Symptomatic atrial fibrillation/flutter  PROCEDURE:   Informed consent was obtained prior to the procedure. The risks, benefits and alternatives for the procedure were discussed and the patient comprehended these risks.  Risks include, but are not limited to, cough, sore throat, vomiting, nausea, somnolence, esophageal and stomach trauma or perforation, bleeding, low blood pressure, aspiration, pneumonia, infection, trauma to the teeth and death.    After a procedural time-out, the oropharynx was anesthetized and the patient was sedated by the anesthesia service. Unfortunately, during initial attempt at probe passage, patient had significant hypoxemic respiratory decompensation requiring BVM and an oral airway. Given this airway event, decision was made to abort the case.  COMPLICATIONS:    Complications: As above, significant hypoxemic respiratory decompensation. Case was aborted. Case discussed with patient's wife and ordering provider.  Signed, Caron Poser, MD

## 2024-08-20 NOTE — Anesthesia Preprocedure Evaluation (Addendum)
 Anesthesia Evaluation  Patient identified by MRN, date of birth, ID band Patient awake    Reviewed: Allergy & Precautions, NPO status , Patient's Chart, lab work & pertinent test results  History of Anesthesia Complications Negative for: history of anesthetic complications  Airway Mallampati: III  TM Distance: >3 FB Neck ROM: full    Dental  (+) Partial Lower, Partial Upper   Pulmonary shortness of breath and with exertion, sleep apnea , COPD   Pulmonary exam normal  + decreased breath sounds      Cardiovascular Exercise Tolerance: Poor hypertension, Pt. on medications + CAD and +CHF  + dysrhythmias Atrial Fibrillation and Supra Ventricular Tachycardia  Rhythm:Irregular Rate:Normal     Neuro/Psych  Neuromuscular disease  negative psych ROS   GI/Hepatic negative GI ROS, Neg liver ROS, hiatal hernia,,,  Endo/Other  negative endocrine ROS  Class 3 obesity  Renal/GU negative Renal ROS  negative genitourinary   Musculoskeletal  (+) Arthritis ,    Abdominal  (+) + obese  Peds negative pediatric ROS (+)  Hematology negative hematology ROS (+)   Anesthesia Other Findings Past Medical History: 03/28/2023: Actinic keratosis     Comment:  glabella, needs LN2 No date: Aortic atherosclerosis (HCC)     Comment:  a. Noted on CT 02/2023. No date: Arthritis 03/28/2023: Basal cell carcinoma     Comment:  right paranasal, no tx, cleared with bx No date: BPH (benign prostatic hyperplasia)     Comment:  nocturia x4 at baseline No date: Coronary artery calcification seen on CT scan No date: H/O hiatal hernia No date: Hemorrhoids No date: History of echocardiogram     Comment:  a. 04/2023 Echo: EF 60-65%, no rwma, mild LVH, nl RV fxn,              triv MR. No date: Hyperlipidemia No date: Hypertension     Comment:  Dr Tisovec- LOV with clearance 3/13 on chart No date: IGT (impaired glucose tolerance)     Comment:  last AIC  5.5- diet controlled- per office note 3/13 04/14/2023: Paroxysmal atrial fibrillation (HCC)     Comment:  a. 04/2023 Admitted with A-fib RVR.  Normal echo.                Discharged on amiodarone ; b. 05/2023 Zio: Predominantly               sinus rhythm at 66 (50-88).  First-degree AV block.  4               beats of NSVT.  No sustained arrhythmias.  Amiodarone                discontinued. 02/10/2005: SCC (squamous cell carcinoma)     Comment:  scc in situ-left forearm. 02/10/2005: SCCA (squamous cell carcinoma) of skin     Comment:  Right Outer Eye (in situ) (curet and excision) 02/13/2006: SCCA (squamous cell carcinoma) of skin     Comment:  Right Ear (well diff) (curet and 5FU) 04/05/2006: SCCA (squamous cell carcinoma) of skin     Comment:  Left Ear Bowl (in situ) (tx p bx) 07/06/2010: SCCA (squamous cell carcinoma) of skin     Comment:  Right Cheek (in situ) (curet and zyclara) 04/27/2009: SCCA (squamous cell carcinoma) of skin     Comment:  Left Upper Forearm (in situ) (curet 5FU) 08/28/2014: SCCA (squamous cell carcinoma) of skin     Comment:  Right Cheek (well diff) 03/21/2016: SCCA (squamous cell carcinoma) of skin  Comment:  Left Jawline Ant (well diff) (curet and 5FU) 01/19/2017: SCCA (squamous cell carcinoma) of skin     Comment:  Right Hand (in situ) (tx p bx) 03/04/1840: SCCA (squamous cell carcinoma) of skin     Comment:  Left Jawline Inf (in situ) (curet and 5FU) 12/23/2020: SCCA (squamous cell carcinoma) of skin     Comment:  Left Forehead (well diff) (tx p bx) 12/23/2020: SCCA (squamous cell carcinoma) of skin     Comment:  Left Submandibular Area (in situ) (tx p bx) No date: Seasonal allergies No date: Spinal stenosis 08/31/2020: Squamous cell carcinoma of skin     Comment:  in situ-left jawline cheek 04/27/2021: Squamous cell carcinoma of skin     Comment:  infil- left nasal sidewall(MOHS) 03/28/2023: Squamous cell carcinoma of skin     Comment:  Left medial  cheek above nasolabial, not treated, but               cleared with bx 08/16/2023: Squamous cell carcinoma of skin     Comment:  Left lateral upper pretibia, [facility] 04/27/2021: Squamous cell carcinoma of skin of face     Comment:  infil- Left Temporal scalp (MOHS No date: UTI (lower urinary tract infection)     Comment:  required admission to [facility]  Past Surgical History: 2008: BACK SURGERY     Comment:  Laminectomy L4-5 No date: JOINT REPLACEMENT; Bilateral     Comment:  bil No date: KNEE ARTHROSCOPY     Comment:  right 11/20/2018: LUMBAR LAMINECTOMY/DECOMPRESSION MICRODISCECTOMY;  Bilateral     Comment:  Procedure: Bilateral Thoracic Twelve to Lumbar One               Laminectomy;  Surgeon: Colon Shove, MD;  Location: [facility]               OR;  Service: Neurosurgery;  Laterality: Bilateral;                posterior 03/20/2012: TOTAL HIP ARTHROPLASTY     Comment:  Procedure: TOTAL HIP ARTHROPLASTY ANTERIOR APPROACH;                Surgeon: Donnice JONETTA Car, MD;  Location: [facility] ORS;  Service:              Orthopedics;  Laterality: Left;  BMI    Body Mass Index: 34.44 kg/m      Reproductive/Obstetrics negative OB ROS                              Anesthesia Physical Anesthesia Plan  ASA: 4  Anesthesia Plan: General   Post-op Pain Management:    Induction: Intravenous  PONV Risk Score and Plan: Propofol  infusion and TIVA  Airway Management Planned: Natural Airway and Nasal Cannula  Additional Equipment:   Intra-op Plan:   Post-operative Plan:   Informed Consent: I have reviewed the patients History and Physical, chart, labs and discussed the procedure including the risks, benefits and alternatives for the proposed anesthesia with the patient or authorized representative who has indicated his/her understanding and acceptance.     Dental Advisory Given  Plan Discussed with: CRNA  Anesthesia Plan Comments: (Patient consented for risks of  anesthesia including but not limited to:  - adverse reactions to medications - risk of airway placement if required - damage to eyes, teeth, lips or other oral mucosa - nerve damage due to positioning  - sore throat  or hoarseness - Damage to heart, brain, nerves, lungs, other parts of body or loss of life  Patient voiced understanding and assent.)        Anesthesia Quick Evaluation

## 2024-08-20 NOTE — Telephone Encounter (Signed)
 I received message from [place] with [facility] [place], Glade   he is approved and we have left messages to call us  back to schedule his setup appt.  I will have my CSR give another call tomorrow once he is home from his procedure to get him scheduled  I called and gave this information to Mrs. Shuey and I gave her [facility]'s number to call

## 2024-08-20 NOTE — H&P (Signed)
 History and Physical Interval Note:  08/20/2024 11:32 AM  Todd Gonzales  has presented today for TEE and Cardioversion with a diagnosis of atrial fibrillation.  The various methods of treatment have been discussed with the patient and family. After consideration of risks, benefits and other options for treatment, the patient has consented to  Procedure(s): ECHOCARDIOGRAM, TRANSESOPHAGEAL (N/A) CARDIOVERSION (N/A) as a diagnostic and therapeutic intervention.  The patient's history has been reviewed, patient examined, no change in status, stable for procedure.  I have reviewed the patient's chart and labs.  Questions were answered to the patient's satisfaction.     Caron [facility]

## 2024-08-20 NOTE — Telephone Encounter (Signed)
 Noted. Nothing further needed.

## 2024-08-20 NOTE — Telephone Encounter (Signed)
 Per Glade with [facility] they received the Bipap order on  9/5 and it was pending approval. I have sent urgent message to [facility] asking for update

## 2024-08-21 ENCOUNTER — Ambulatory Visit: Payer: Self-pay | Admitting: Family Medicine

## 2024-08-21 ENCOUNTER — Telehealth: Payer: Self-pay | Admitting: Cardiology

## 2024-08-21 DIAGNOSIS — K080 Exfoliation of teeth due to systemic causes: Secondary | ICD-10-CM | POA: Diagnosis not present

## 2024-08-21 NOTE — Telephone Encounter (Signed)
 I called patient to discuss aborted TEE/DCCV attempted yesterday.  Discussed that ideally, he takes eliquis  uninterrupted for 4 weeks, and then retry DCCV. Alternatively, could intubate for TEE/DCCV, patient not interested in this.   Offered to adjust OAC to xarelto  for once-daily administration. Patient's wife did not want to adjust at this time.   At this time, patient will continue BID eliquis , daily amiodarone  along with daily lasix , and proceed with DCCV in ~4 weeks.  Urged him to notify office if edema or weight continue to increase, or if he begins to feel more fatigued, as we would adjust plan.  Patient and wife verbalized understanding of plan.   He has follow-up 10/14 with NP Vivienne to reassess [facility] use and re discuss DCCV if he remains in afib.

## 2024-08-21 NOTE — Anesthesia Postprocedure Evaluation (Signed)
 Anesthesia Post Note  Patient: [place] PRES[place]  Procedure(s) Performed: ECHOCARDIOGRAM, TRANSESOPHAGEAL CARDIOVERSION  Patient location during evaluation: Specials Recovery Anesthesia Type: General Level of consciousness: awake and alert Pain management: pain level controlled Vital Signs Assessment: post-procedure vital signs reviewed and stable Respiratory status: spontaneous breathing, nonlabored ventilation, respiratory function stable and patient connected to nasal cannula oxygen Cardiovascular status: blood pressure returned to baseline and stable Postop Assessment: no apparent nausea or vomiting Anesthetic complications: yes   Encounter Notable Events  Notable Event Outcome Phase Comment  Airway obstruction requiring support  Intraprocedure OAW placed  Desaturation < 90% for over 3 min or < 80% for over 1 min  Intraprocedure BVM initited to recover O2 saturation to 90s     Last Vitals:  Vitals:   08/20/24 1330 08/20/24 1345  BP: (!) 141/75 120/78  Pulse: (!) 55 62  Resp: (!) 22 (!) 24  Temp:  36.5 C  SpO2: 95% 91%    Last Pain:  Vitals:   08/20/24 1345  TempSrc: Temporal  PainSc: 0-No pain                 Debby [place]

## 2024-08-22 ENCOUNTER — Ambulatory Visit: Admitting: Internal Medicine

## 2024-08-22 DIAGNOSIS — R400 Somnolence: Secondary | ICD-10-CM | POA: Diagnosis not present

## 2024-08-23 ENCOUNTER — Ambulatory Visit: Admitting: Nurse Practitioner

## 2024-08-24 ENCOUNTER — Other Ambulatory Visit: Payer: Self-pay | Admitting: Nurse Practitioner

## 2024-08-26 ENCOUNTER — Encounter: Payer: Self-pay | Admitting: Internal Medicine

## 2024-08-27 ENCOUNTER — Ambulatory Visit

## 2024-08-27 DIAGNOSIS — J9601 Acute respiratory failure with hypoxia: Secondary | ICD-10-CM | POA: Diagnosis not present

## 2024-08-27 DIAGNOSIS — I4892 Unspecified atrial flutter: Secondary | ICD-10-CM | POA: Diagnosis not present

## 2024-08-27 DIAGNOSIS — I5033 Acute on chronic diastolic (congestive) heart failure: Secondary | ICD-10-CM | POA: Diagnosis not present

## 2024-08-27 DIAGNOSIS — E669 Obesity, unspecified: Secondary | ICD-10-CM | POA: Diagnosis not present

## 2024-08-27 DIAGNOSIS — I2510 Atherosclerotic heart disease of native coronary artery without angina pectoris: Secondary | ICD-10-CM | POA: Diagnosis not present

## 2024-08-27 DIAGNOSIS — C44320 Squamous cell carcinoma of skin of unspecified parts of face: Secondary | ICD-10-CM | POA: Diagnosis not present

## 2024-08-27 DIAGNOSIS — J9622 Acute and chronic respiratory failure with hypercapnia: Secondary | ICD-10-CM | POA: Diagnosis not present

## 2024-08-27 DIAGNOSIS — Z6834 Body mass index (BMI) 34.0-34.9, adult: Secondary | ICD-10-CM | POA: Diagnosis not present

## 2024-08-27 DIAGNOSIS — G4733 Obstructive sleep apnea (adult) (pediatric): Secondary | ICD-10-CM | POA: Diagnosis not present

## 2024-08-27 DIAGNOSIS — I480 Paroxysmal atrial fibrillation: Secondary | ICD-10-CM | POA: Diagnosis not present

## 2024-08-27 DIAGNOSIS — Z9181 History of falling: Secondary | ICD-10-CM | POA: Diagnosis not present

## 2024-08-27 DIAGNOSIS — Z7901 Long term (current) use of anticoagulants: Secondary | ICD-10-CM | POA: Diagnosis not present

## 2024-08-27 DIAGNOSIS — E785 Hyperlipidemia, unspecified: Secondary | ICD-10-CM | POA: Diagnosis not present

## 2024-08-27 DIAGNOSIS — E876 Hypokalemia: Secondary | ICD-10-CM | POA: Diagnosis not present

## 2024-08-27 DIAGNOSIS — I110 Hypertensive heart disease with heart failure: Secondary | ICD-10-CM | POA: Diagnosis not present

## 2024-08-27 NOTE — Telephone Encounter (Signed)
 Noted! Thank you

## 2024-08-28 NOTE — Telephone Encounter (Signed)
 Left message for patient to return call to the office regarding scheduled appointment for 9/25   Per Anner - change 10:00 appointment to [facility] from [facility] and move the entire appointment to the 9:00 slot

## 2024-08-29 ENCOUNTER — Ambulatory Visit: Attending: Cardiology | Admitting: Cardiology

## 2024-08-29 ENCOUNTER — Other Ambulatory Visit: Payer: Self-pay | Admitting: Family Medicine

## 2024-08-29 ENCOUNTER — Encounter: Payer: Self-pay | Admitting: Cardiology

## 2024-08-29 ENCOUNTER — Ambulatory Visit: Admitting: Sleep Medicine

## 2024-08-29 ENCOUNTER — Telehealth: Payer: Self-pay | Admitting: Emergency Medicine

## 2024-08-29 VITALS — BP 116/60 | HR 81 | Ht 70.0 in | Wt 244.8 lb

## 2024-08-29 DIAGNOSIS — I4892 Unspecified atrial flutter: Secondary | ICD-10-CM | POA: Diagnosis not present

## 2024-08-29 DIAGNOSIS — I480 Paroxysmal atrial fibrillation: Secondary | ICD-10-CM

## 2024-08-29 DIAGNOSIS — I5032 Chronic diastolic (congestive) heart failure: Secondary | ICD-10-CM | POA: Diagnosis not present

## 2024-08-29 DIAGNOSIS — G4733 Obstructive sleep apnea (adult) (pediatric): Secondary | ICD-10-CM | POA: Insufficient documentation

## 2024-08-29 DIAGNOSIS — Z01810 Encounter for preprocedural cardiovascular examination: Secondary | ICD-10-CM

## 2024-08-29 DIAGNOSIS — Z79899 Other long term (current) drug therapy: Secondary | ICD-10-CM | POA: Insufficient documentation

## 2024-08-29 MED ORDER — METOPROLOL TARTRATE 25 MG PO TABS: ORAL_TABLET | ORAL | Status: DC

## 2024-08-29 MED ORDER — FUROSEMIDE 20 MG PO TABS: 20.0000 mg | ORAL_TABLET | Freq: Every day | ORAL | 3 refills | Status: DC

## 2024-08-29 NOTE — Assessment & Plan Note (Signed)
 At this point he may be becoming more consistent with persistent A-fib, and I am concerned that it is the A-fib that triggers his symptoms since he usually is doing well in sinus rhythm.  Currently in A-fib. Unfortunately, he was not able to go through with cardioversion because of lack of twice daily using of Eliquis .  He has not missed a dose of Eliquis  since.  Persistent AFib with failed conversion due to inadequate anticoagulation. Intolerance to amiodarone  due to nausea. Cardioversion planned without TEE. Alternative antiarrhythmic options and electrophysiology referral considered. - Continue Eliquis  2 mg BID. - Hold amiodarone  for one week to assess nausea improvement. - Resume amiodarone  at 100 mg daily if tolerated after one week off. - Schedule cardioversion for the week of September 19, 2024, without TEE, therefore allowing for 4 weeks of uninterrupted DOAC. - Refer to EP MD for evaluation of AFib management options, including potential ablation, Tikosyn, or pacemaker with AV node ablation. - Consider alternative antiarrhythmic therapy if amiodarone  intolerance persist

## 2024-08-29 NOTE — Progress Notes (Signed)
 Cardiology Office Note:  .   Date:  08/29/2024  ID:  MONTEE TALLMAN, DOB 10/02/1941, MRN [id number] PCP: Cleatus Arlyss RAMAN, MD  [facility] Providers Cardiologist:  Alm Clay, MD Cardiology APP:  Vivienne Lonni Ingle, NP     Chief Complaint  Patient presents with   Follow-up    Pt no doing good, still with A-fib and do not feel well, shortness of breath, lighthearted, dizziness.   Atrial Fibrillation   Congestive Heart Failure    Patient Profile: .     WINDEL Gonzales is an obese 83 y.o. male with a PMH notable for PAF/Flutter, HFpEF, OSA who presents here for close follow-up as a work in visit.  I last saw Mr. Harshberger on January 18, 2024 following hospitalization for heart failure in January.  Again he was noted to be in A-fib RVR with likely decompensated acute on chronic diastolic heart failure.  He was loaded with amiodarone  and underwent TEE DCCV to restore sinus rhythm and was discharged on amiodarone  taper.  He was not using his Lasix  routinely stating that he does not like the urination.  Ankles were pretty well controlled.  No notable swelling.  Noticeable exertional dyspnea but getting better since restoring sinus rhythm.  My plan was for him to use amiodarone  on an as needed basis so I reduced him to 100 mg daily with plans to discontinue in March and then restart metoprolol  25 mg twice daily.  I recommend that he buy a Kardia mobile to monitor his heart rhythms.  I also recommend he continue taking Eliquis  20 mg twice daily.     Todd Gonzales has had an eventful 6 months since I last saw him-he has been seen by APP's, PCCM and has been admitted to the hospital. [name], NP 04/18/2024.  He had not required amiodarone  since his hospitalization but he was unaware of any recurrence of A-fib.  He noted chronic stable dyspnea on exertion with intermittent lower EXTR edema.  Using PRN Lasix  but only rarely.  He thought his dry weight was roughly 240 pounds, but was  noted to be volume up at 239 pounds in the clinic.  His dry weight when leaving the hospital was 225 pounds.  He did note that he i all has had some lower extremity edema.  He was in A-fib. => Recommend that he shoot for a dry weight of roughly 230 pounds and use Lasix  more consistently.  He was thought to be back on metoprolol  tartrate 25 mg twice daily.  Unfortunately it appeared that this had been stopped as of April.  Ostensibly by his PCP. He was seen by Dr. Alm Cellar from pulmonary medicine for sleep apnea evaluation with sleep study ordered. => HST May 17, 2024 showed severe sleep apnea AHI of 80 with significant epoxy.  Telemedicine follow-up on July 3 suggested use of CPAP 4-16.  He was using an nasal mask AirFit P 30I  This he was admitted from 8/14-18/2025 for acute on chronic diastolic heart failure/acute hypoxic respite failure.  He presented with bodyaches and low energy as well as worsening edema and dyspnea.  He was not using his diuretics because of frequent urination and was not using his CPAP regularly because of high pressures.  His oxygen saturation were in the 70s upon EMS arrival.  He is felt to be fluid overloaded and hypoxic with leg edema.  He was diuresed with IV Lasix .  Discharge weight was 233 pounds on his  home scale. He was seen by [name], NP on 07/25/2024 for follow-up.  He noted his weights were stable at ~2 and 33 pounds..  He was compliant with taking Lasix  20 mg daily and not having significant edema.  He was noting fatigue which he felt was related to being sedentary.  Periodically using Kardia-Mobile, and on the morning of the visit was noted to be in A-fib, but was mostly asymptomatic beyond his chronic stable exertional dyspnea.  No sensation of irregular heartbeats no palpitations.  Edema stable.  He took 4 mg amiodarone  that morning. => Recommended taking amiodarone  20 mg twice daily for the next week and then drop 20 mg daily.  The plan was to see him back  in 2 weeks to reassess rhythm and determine if cardioversion necessary. On September 9 they called in stating that he was in A-fib RVR.  Had been in A-fib for 3 days-noted by the therapist and was working with him => he again recommended taking amiodarone  200 mg daily and scheduled for follow-up with Suzann Riddle.,  NP (EP).  Major symptom noted was fatigue He was seen on September 15 by Suzann Riddle, NP -> he was in A-fib/flutter but was not aware of the arrhythmia.  At that time he noted he was taking his PRN furosemide  with robust urine output he did not like taking it every day because of robust urine output.  They also noted that his Molly indicated he had been in A-fib for several days.  He did indicate he was sometimes missing the second dose of amiodarone .  Just generally suddenly felt unwell so he would not sometimes wake up in the afternoon to take his medications if he is not feeling well.  Open EKG showed atrial flutter with variable block. => Plan was for TEE DCCV since he had not been taking his Eliquis  twice daily. He showed up on 08/20/2024 for cardioversion, but he decompensated significantly with initial TEE probe insertion with significant hypoxemia requiring BVM -> case aborted.  There was a lack of desire to intubate for TEE DCCV. => The main issue was the lack of twice Eliquis  consumption.  They discussed the possibility of switching to Xarelto  but family did not want to do so. => Per Chantal Needle, NP: Offered to adjust OAC to xarelto  for once-daily administration. Patient's wife did not want to adjust at this time.  At this time, patient will continue BID eliquis , daily amiodarone  along with daily lasix , and proceed with DCCV in ~4 weeks. Urged him to notify office if edema or weight continue to increase, or if he begins to feel more fatigued, as we would adjust plan.  Patient and wife verbalized understanding of plan.  => He was very to call if he was noticing worsening dyspnea  or edema.  Plan was for 10/14 follow-up with [name], NP  Subjective  Discussed the use of AI scribe software for clinical note transcription with the patient, who gave verbal consent to proceed.  History of Present Illness Todd Gonzales is an 83 year old male with atrial fibrillation who presents with issues related to medication management and fluid retention.  He is experiencing issues with atrial fibrillation management. A scheduled cardioversion earlier this week was not completed due to a reaction to anesthesia, which caused a significant drop in oxygen levels. He is experiencing increased shortness of breath and fluid retention. He takes amiodarone  200 mg daily, which causes nausea, particularly after breakfast, impacting his ability  to engage in activities like playing bridge.  He has been experiencing fluid retention, with a recent weight increase of five pounds. His baseline weight is typically between 238 to 240 pounds, but it has risen to 245 pounds. He takes Lasix  (furosemide ) 20 mg as needed, but not daily. When his weight increases, he takes an additional dose, but notes that the response to Lasix  has not been as strong recently.  He has been on Eliquis  for atrial fibrillation but was only taking it once a day instead of the prescribed twice daily, leading to the need for a transesophageal echocardiogram (TEE) before cardioversion. Since the event on the 16th, he has been taking Eliquis  twice daily without missing a dose.  He experiences occasional shortness of breath when lying on his left side and nasal congestion at night, which causes him to wake up. No dizziness, lightheadedness, or syncope, attributing any instability to his knees rather than his head.  He has a CPAP machine for sleep apnea, which he has not been using due to discomfort with the pressure settings. He is scheduled to meet with a specialist to address these issues.  Cardiovascular ROS: positive  for - dyspnea on exertion, edema, palpitations, and weight gain.  Rarely feels irregular heartbeats.  Sort of breath most laying on his left side but not real true PND or orthopnea. negative for - chest pain, irregular heartbeat, orthopnea, paroxysmal nocturnal dyspnea, rapid heart rate, or syncope or near syncope TIA or amaurosis fugax.  Claudication.  ROS:  Review of Systems - Negative except symptoms noted above.    Objective   Current Meds Cardiac Medications Sig   amiodarone  (PACERONE ) 200 MG tablet TAKE 200 MG TWICE DAILY FOR ONE WEEK, THEN TAKE 200 MG ONCE DAILY. => Currently on hold   ELIQUIS  5 MG TABS tablet TAKE ONE TABLET BY MOUTH TWICE A DAY   furosemide  (LASIX ) 20 MG tablet Take 1 tablet (20 mg total) by mouth daily.    Noncardiac Medications Sig   acetaminophen  (TYLENOL ) 650 MG CR tablet Take 1,300 mg by mouth 2 (two) times daily as needed for pain.   cyanocobalamin  (VITAMIN B12) 1000 MCG/ML injection Inject 1 mL (1,000 mcg total) into the muscle every 30 (thirty) days.   diclofenac  Sodium (VOLTAREN ) 1 % GEL Apply 1 Application topically every 6 (six) hours as needed (pain).   fluorouracil  (EFUDEX ) 5 % cream Apply topically 2 (two) times daily. Bid 5-7 days to temples, cheeks, forehead and nose   gabapentin  (NEURONTIN ) 300 MG capsule TAKE ONE CAPSULE BY MOUTH ONCE DAILY   hydrocortisone  (PROCTOSOL HC) 2.5 % rectal cream Place 1 Application rectally 2 (two) times daily as needed for hemorrhoids or anal itching. Home med.   ondansetron  (ZOFRAN ) 4 MG tablet TAKE ONE TABLET (4 MG TOTAL) BY MOUTH EVERY EIGHT (EIGHT) HOURS AS NEEDED FOR NAUSEA OR VOMITING.   polyethylene glycol powder (GLYCOLAX /MIRALAX ) 17 GM/SCOOP powder Take 17 g by mouth daily. Dissolve 1 capful (17g) in 4-8 ounces of liquid and take by mouth daily.   tamsulosin  (FLOMAX ) 0.4 MG CAPS capsule Take 1 capsule (0.4 mg total) by mouth daily.   triamcinolone  cream (KENALOG ) 0.1 % Apply 1-2 times daily to aa, lower legs  for up to 2 weeks. Avoid applying to face, groin, and axilla. Use as directed. Long-term use can cause thinning of the skin.    Studies Reviewed: SABRA   EKG Interpretation Date/Time:  Thursday August 29 2024 10:27:39 EDT Ventricular Rate:  81 PR Interval:  QRS Duration:  88 QT Interval:  382 QTC Calculation: 443 R Axis:   42  Text Interpretation: Atrial fibrillation Low voltage QRS When compared with ECG of 20-Aug-2024 11:51, Atrial fibrillation has replaced Atrial flutter Confirmed by Anner Lenis ([id number]) on 08/29/2024 7:49:49 PM    Lab Results  Component Value Date   NA 142 08/19/2024   CL 101 08/19/2024   K 4.5 08/19/2024   CO2 26 08/19/2024   BUN 14 08/19/2024   CREATININE 1.04 08/19/2024   EGFR 70 08/19/2024   CALCIUM  8.5 (L) 08/19/2024   PHOS 3.3 12/24/2023   ALBUMIN 3.9 08/19/2024   GLUCOSE 89 08/19/2024   Echocardiogram (07/19/2024): EF 60 to 65%.  No RWMA.  GR 1 DD?  Normal RV size and function.  Normal mitral valve.  AoV sclerosis with no stenosis.  IVC dilation ~mildly elevated RAP-8 mmHg Echocardiogram (12/21/2023): EF 60 to 65%.  No RWMA.  Moderate MAC.  Normal RAP and RAP.SABRA Coronary CTA (08/28/2023): CAC 4 9.  Mild LAD and RCA stenosis 25 to 49%.  Minimal LCx <25%.   Risk Assessment/Calculations:    CHA2DS2-VASc Score = 5   This indicates a 7.2% annual risk of stroke. The patient's score is based upon: CHF History: 1 HTN History: 1 Diabetes History: 0 Stroke History: 0 Vascular Disease History: 1 Age Score: 2 Gender Score: 0             Physical Exam:   VS:  BP 116/60 (BP Location: Left Arm, Patient Position: Sitting, Cuff Size: Normal)   Pulse 81   Ht 5' 10 (1.778 m)   Wt 244 lb 12.8 oz (111 kg)   SpO2 99%   BMI 35.13 kg/m    Wt Readings from Last 3 Encounters:  08/29/24 244 lb 12.8 oz (111 kg)  08/20/24 240 lb 14.4 oz (109.3 kg)  08/19/24 241 lb (109.3 kg)     GEN: Obese elderly gentleman who seems to be a little bit out of sorts but  nontoxic.  No acute distress. NECK: No JVD; No carotid bruits CARDIAC: Normal S1, S2; irregularly irregular rhythm with normal rate., no murmurs, rubs, gallops RESPIRATORY:  Clear to auscultation without rales, wheezing or rhonchi ; nonlabored, good air movement. ABDOMEN: Soft, non-tender, non-distended EXTREMITIES: At least 2+ bilateral lower extremity edema to mid calf with some venous stasis discoloration; No deformity      ASSESSMENT AND PLAN: .    Problem List Items Addressed This Visit       Cardiology Problems   Atrial flutter (HCC)   Clearly in atrial fibrillation, but last visit was in atrial flutter.  Would need EP to address both.      Relevant Medications   furosemide  (LASIX ) 20 MG tablet   metoprolol  tartrate (LOPRESSOR ) 25 MG tablet   Chronic heart failure with preserved ejection fraction (HFpEF) (HCC) (Chronic)   Volume overload with recent weight gain and fluid retention. Lasix  dosing adjusted to manage fluid status. - Double Lasix  to 40 mg daily for three days, then 20 mg daily for one day, and repeat cycle until weight returns to baseline. - Monitor weight daily and adjust Lasix  dosing based on weight changes. - Recheck BMP mid-week to monitor kidney function and electrolytes. - Consider increasing Lasix  to 40 mg daily if weight gain persists.      Relevant Medications   furosemide  (LASIX ) 20 MG tablet   metoprolol  tartrate (LOPRESSOR ) 25 MG tablet   Paroxysmal atrial fibrillation/flutter (HCC) - Primary (Chronic)  At this point he may be becoming more consistent with persistent A-fib, and I am concerned that it is the A-fib that triggers his symptoms since he usually is doing well in sinus rhythm.  Currently in A-fib. Unfortunately, he was not able to go through with cardioversion because of lack of twice daily using of Eliquis .  He has not missed a dose of Eliquis  since.  Persistent AFib with failed conversion due to inadequate anticoagulation. Intolerance  to amiodarone  due to nausea. Cardioversion planned without TEE. Alternative antiarrhythmic options and electrophysiology referral considered. - Continue Eliquis  2 mg BID. - Hold amiodarone  for one week to assess nausea improvement. - Resume amiodarone  at 100 mg daily if tolerated after one week off. - Schedule cardioversion for the week of September 19, 2024, without TEE, therefore allowing for 4 weeks of uninterrupted DOAC. - Refer to EP MD for evaluation of AFib management options, including potential ablation, Tikosyn, or pacemaker with AV node ablation. - Consider alternative antiarrhythmic therapy if amiodarone  intolerance persist      Relevant Medications   furosemide  (LASIX ) 20 MG tablet   metoprolol  tartrate (LOPRESSOR ) 25 MG tablet   Other Relevant Orders   EKG 12-Lead (Completed)   Ambulatory referral to Cardiac Electrophysiology     Other   On amiodarone  therapy (Chronic)   Nausea associated with amiodarone , impacting daily activities. - Hold amiodarone  for one week to assess for improvement in nausea. - If nausea improves, resume amiodarone  at 100 mg daily if tolerated.      OSA (obstructive sleep apnea)   Obstructive sleep apnea with CPAP intolerance OSA with difficulty tolerating CPAP due to pressure settings. Correlation between treating OSA and AFib noted. - Attend CPAP adjustment appointment to optimize pressure settings. - Encourage consistent use of CPAP to manage sleep apnea and potentially improve AFib control.      Other Visit Diagnoses       Preprocedural cardiovascular examination       Relevant Orders   Basic metabolic panel with GFR           Informed Consent   Shared Decision Making/Informed Consent{ The risks (stroke, cardiac arrhythmias rarely resulting in the need for a temporary or permanent pacemaker, skin irritation or burns and complications associated with conscious sedation including aspiration, arrhythmia, respiratory failure and death),  benefits (restoration of normal sinus rhythm) and alternatives of a direct current cardioversion were explained in detail to Mr. Worrel and he agrees to proceed.        Follow-Up: No follow-ups on file.  I spent 96 minutes in the care of VLADIMIR LENHOFF today including reviewing labs (1 minute), reviewing studies (echocardiograms and Coronary CTA reviewed 7 minutes), reviewing outside studies (N/A), face to face time discussing treatment options (40 minutes), reviewing records from visits between my note in February 2025-APP notes, hospital visits and phone notes (30 minutes ), 16 minutes dictating, and documenting in the encounter.      Signed, Alm MICAEL Clay, MD, MS Alm Clay, M.D., M.S. Interventional Cardiologist  [facility] Pager # [phone]

## 2024-08-29 NOTE — Telephone Encounter (Signed)
 Called and spoke with the patient's wife regarding the After-Visit Summary.  Went over, in detail, the instructions and schedule for medication administration, lab work, referral for EP, and Cardioversion, as well as need to take Eliquis  5 mg BID as directed, the use of Furosemide  to achieve and maintain target weight of 237 lbs, and to trial going off of Amiodarone  and going back on it after a week.  Todd Gonzales, spouse, verbalized understanding of all instructions and schedule.  Patient repeated everything back to ensure correct information and schedule of events.  Patient also sent a MyChart message with the AVS, as well as a hard copy sent via mail to their address. All questions and concerns addressed at this time.    Medication Instructions:    Your physician recommends the following medication changes.   Take Furosemide  (Lasix ) 2 tablets (40 mg) for 3 days, then take 1 tablet (20 mg) for 1 day, then take 2 tablets (40 mg) for 3 days, then take 1 tablet (20 mg) for 1 day to reach target weight of 237 lbs.  Once you have reached target weight, take 1 tablet (20 mg) once daily.     If you have weight gain of 3 lbs or more in one day, OR 5 lbs or more in one week, take an additional tablet of Furosemide  (Lasix )   Take Eliquis  5 mg one tablet 2 times daily.   Hold Amiodarone  200 mg for one week.  Then resume one tablet once daily. While you hold Amiodarone , take Metoprolol  Tartrate 25 mg one tablet two times daily.   *If you need a refill on your cardiac medications before your next appointment, please call your pharmacy*   Lab Work:   Your provider would like for you to return in September 13, 2024 to have the following labs drawn: BMP.    Please go to [facility] [address] ([facility]) [address], [place] [address] You do not need an appointment.  They are open from 8 am- 4:30 pm.  Lunch from 1:00 pm- 2:00 pm You Do NOT need to be fasting.     You may also go to one  of the following [facility]:   [address] [place], [place] [address] Phone: [phone] Lab hours: Mon-Fri 8 am- 5 pm    Lunch 12 pm- 1 pm   [address] [place],  [place]  [address]  [place]  Phone: [phone] Lab hours: 7 am- 4 pm Lunch 12 pm-1 pm    [address] [place],  [place]  [address]  [place]  Phone: [phone] Lab hours: Mon-Fri 8 am- 5 pm    Lunch 12 pm- 1 pm    If you have labs (blood work) drawn today and your tests are completely normal, you will receive your results only by: MyChart Message (if you have MyChart) OR A paper copy in the mail If you have any lab test that is abnormal or we need to change your treatment, we will call you to review the results.   Testing/Procedures:       Dear Todd Gonzales  You are scheduled for a Cardioversion on Thursday, October 16 with Dr. Gollan.  Please arrive at the [facility] Entrance of [facility], [address], [place] [address] at 11:30 AM (This is 1 hour(s) prior to your procedure time).  Proceed to the Check-In Desk directly inside the entrance.   Procedure Parking: Use the entrance off of the [address] side  of the hospital. Turn right upon entering and follow the driveway to parking that is directly in front of the [facility] Vascular Center. There is no valet parking available at this entrance, however there is an awning directly in front of the Heart & Vascular Center for drop off/ pick up for patients.    DIET:  Nothing to eat or drink after midnight except a sip of water with medications (see medication instructions below)   MEDICATION INSTRUCTIONS: !!IF ANY NEW MEDICATIONS ARE STARTED AFTER TODAY, PLEASE NOTIFY YOUR PROVIDER AS SOON AS POSSIBLE!!  FYI: Medications such as Semaglutide (Ozempic, [place]), Tirzepatide (Mounjaro, Zepbound), Dulaglutide (Trulicity), etc (GLP1 agonists) AND Canagliflozin (Invokana), Dapagliflozin (Farxiga), Empagliflozin (Jardiance), Ertugliflozin (Steglatro), Bexagliflozin  [employer]) or any combination with one of these drugs such as Invokamet (Canagliflozin/Metformin), Synjardy (Empagliflozin/Metformin), etc (SGLT2 inhibitors) must be held around the time of a procedure. This is not a comprehensive list of all of these drugs. Please review all of your medications and talk to your provider if you take any one of these. If you are not sure, ask your provider.      Continue taking your anticoagulant (blood thinner): Apixaban  (Eliquis ).  You will need to continue this after your procedure until you are told by your provider that it is safe to stop.     LABS:   Come to [facility] on September 13, 2024 for BMP.   FYI:  For your safety, and to allow us  to monitor your vital signs accurately during the surgery/procedure we request: If you have artificial nails, gel coating, SNS etc, please have those removed prior to your surgery/procedure. Not having the nail coverings /polish removed may result in cancellation or delay of your surgery/procedure.   Your support person will be asked to wait in the waiting room during your procedure.  It is OK to have someone drop you off and come back when you are ready to be discharged.  You cannot drive after the procedure and will need someone to drive you home.   Bring your insurance cards.   *Special Note: Every effort is made to have your procedure done on time. Occasionally there are emergencies that occur at the hospital that may cause delays. Please be patient if a delay does occur.        Referrals:   Your cardiologist has referred you to Electrophysiology (Dr. Cindie or Dr. Kennyth).  We have attached their office location and phone number below.  Please allow them 3-5 business days to reach out to you to make an appointment.  If you have not heard from their office within that time, please call them to schedule your appointment.     Follow-Up:   At [facility], you and your health needs are our  priority.  As part of our continuing mission to provide you with exceptional heart care, our providers are all part of one team.  This team includes your primary Cardiologist (physician) and Advanced Practice Providers or APPs (Physician Assistants and Nurse Practitioners) who all work together to provide you with the care you need, when you need it.   Your next appointment:   1 month(s)   Provider:     Alm Clay, MD or [name], NP     We recommend signing up for the patient portal called MyChart.  Sign up information is provided on this After Visit Summary.  MyChart is used to connect with patients for Virtual Visits (Telemedicine).  Patients are able to  view lab/test results, encounter notes, upcoming appointments, etc.  Non-urgent messages can be sent to your provider as well.   To learn more about what you can do with MyChart, go to [email or web address].

## 2024-08-29 NOTE — Assessment & Plan Note (Signed)
 Obstructive sleep apnea with CPAP intolerance OSA with difficulty tolerating CPAP due to pressure settings. Correlation between treating OSA and AFib noted. - Attend CPAP adjustment appointment to optimize pressure settings. - Encourage consistent use of CPAP to manage sleep apnea and potentially improve AFib control.

## 2024-08-29 NOTE — Patient Instructions (Signed)
 Medication Instructions:   Your physician recommends the following medication changes.  Take Furosemide  (Lasix ) 2 tablets (40 mg) for 3 days, then take 1 tablet (20 mg) for 1 day, then take 2 tablets (40 mg) for 3 days, then take 1 tablet (20 mg) for 1 day to reach target weight of 237 lbs.  Once you have reached target weight, take 1 tablet (20 mg) once daily.    If you have weight gain of 3 lbs or more in one day, OR 5 lbs or more in one week, take an additional tablet of Furosemide  (Lasix )  Take Eliquis  5 mg one tablet 2 times daily.  Hold Amiodarone  200 mg for one week.  Then resume one tablet once daily. While you hold Amiodarone , take Metoprolol  Tartrate 25 mg one tablet two times daily.  *If you need a refill on your cardiac medications before your next appointment, please call your pharmacy*  Lab Work:  Your provider would like for you to return in September 13, 2024 to have the following labs drawn: BMP.   Please go to [facility] [address] ([facility]) [address], [place] [address] You do not need an appointment.  They are open from 8 am- 4:30 pm.  Lunch from 1:00 pm- 2:00 pm You Do NOT need to be fasting.   You may also go to one of the following [facility]:  [address] [place], [place] [address] Phone: [phone] Lab hours: Mon-Fri 8 am- 5 pm    Lunch 12 pm- 1 pm  [address] [place],  [place]  [address]  [place]  Phone: [phone] Lab hours: 7 am- 4 pm Lunch 12 pm-1 pm   [address] [place],  [place]  [address]  [place]  Phone: [phone] Lab hours: Mon-Fri 8 am- 5 pm    Lunch 12 pm- 1 pm   If you have labs (blood work) drawn today and your tests are completely normal, you will receive your results only by:  MyChart Message (if you have MyChart) OR  A paper copy in the mail If you have any lab test that is abnormal or we need to change your treatment, we will call you to review the results.  Testing/Procedures:      Dear Todd Gonzales  You are scheduled for a Cardioversion on Thursday, October 16 with Dr. Gollan.  Please arrive at the [facility] Entrance of [facility], [address], [place] [address] at 11:30 AM (This is 1 hour(s) prior to your procedure time).  Proceed to the Check-In Desk directly inside the entrance.  Procedure Parking: Use the entrance off of the [address] side of the hospital. Turn right upon entering and follow the driveway to parking that is directly in front of the [facility]. There is no valet parking available at this entrance, however there is an awning directly in front of the [facility]ascular Center for drop off/ pick up for patients.    DIET:  Nothing to eat or drink after midnight except a sip of water with medications (see medication instructions below)  MEDICATION INSTRUCTIONS: !!IF ANY NEW MEDICATIONS ARE STARTED AFTER TODAY, PLEASE NOTIFY YOUR PROVIDER AS SOON AS POSSIBLE!!  FYI: Medications such as Semaglutide (Ozempic, [place]), Tirzepatide (Mounjaro, Zepbound), Dulaglutide (Trulicity), etc (GLP1 agonists) AND Canagliflozin (Invokana), Dapagliflozin (Farxiga), Empagliflozin (Jardiance), Ertugliflozin (Steglatro), Bexagliflozin [employer]) or any combination with one of these drugs such as Invokamet (Canagliflozin/Metformin), Synjardy (Empagliflozin/Metformin), etc (SGLT2 inhibitors) must be  held around the time of a procedure. This is not a comprehensive list of all of these drugs. Please review all of your medications and talk to your provider if you take any one of these. If you are not sure, ask your provider.     Continue taking your anticoagulant (blood thinner): Apixaban  (Eliquis ).  You will need to continue this after your procedure until you are told by your provider that it is safe to stop.    LABS:   Come to [facility] on September 13, 2024 for BMP.  FYI:  For your safety, and to allow us  to monitor your vital signs accurately  during the surgery/procedure we request: If you have artificial nails, gel coating, SNS etc, please have those removed prior to your surgery/procedure. Not having the nail coverings /polish removed may result in cancellation or delay of your surgery/procedure.  Your support person will be asked to wait in the waiting room during your procedure.  It is OK to have someone drop you off and come back when you are ready to be discharged.  You cannot drive after the procedure and will need someone to drive you home.  Bring your insurance cards.  *Special Note: Every effort is made to have your procedure done on time. Occasionally there are emergencies that occur at the hospital that may cause delays. Please be patient if a delay does occur.      Referrals:  Your cardiologist has referred you to Electrophysiology (Dr. Cindie or Dr. Kennyth).  We have attached their office location and phone number below.  Please allow them 3-5 business days to reach out to you to make an appointment.  If you have not heard from their office within that time, please call them to schedule your appointment.    Follow-Up:  At [facility], you and your health needs are our priority.  As part of our continuing mission to provide you with exceptional heart care, our providers are all part of one team.  This team includes your primary Cardiologist (physician) and Advanced Practice Providers or APPs (Physician Assistants and Nurse Practitioners) who all work together to provide you with the care you need, when you need it.  Your next appointment:   1 month(s)  Provider:    Alm Clay, MD or [name], NP    We recommend signing up for the patient portal called MyChart.  Sign up information is provided on this After Visit Summary.  MyChart is used to connect with patients for Virtual Visits (Telemedicine).  Patients are able to view lab/test results, encounter notes, upcoming appointments, etc.   Non-urgent messages can be sent to your provider as well.   To learn more about what you can do with MyChart, go to [email or web address].

## 2024-08-29 NOTE — Assessment & Plan Note (Signed)
 Volume overload with recent weight gain and fluid retention. Lasix  dosing adjusted to manage fluid status. - Double Lasix  to 40 mg daily for three days, then 20 mg daily for one day, and repeat cycle until weight returns to baseline. - Monitor weight daily and adjust Lasix  dosing based on weight changes. - Recheck BMP mid-week to monitor kidney function and electrolytes. - Consider increasing Lasix  to 40 mg daily if weight gain persists.

## 2024-08-29 NOTE — Assessment & Plan Note (Signed)
 Clearly in atrial fibrillation, but last visit was in atrial flutter.  Would need EP to address both.

## 2024-08-29 NOTE — Assessment & Plan Note (Signed)
 Nausea associated with amiodarone , impacting daily activities. - Hold amiodarone  for one week to assess for improvement in nausea. - If nausea improves, resume amiodarone  at 100 mg daily if tolerated.

## 2024-08-30 NOTE — Telephone Encounter (Signed)
 Okay to continue if needed.  Rx sent.  Thanks.

## 2024-08-30 NOTE — Telephone Encounter (Signed)
 LOV: 07/02/24 NOV: nothing scheduled  Last Refill: hydrocortisone  (PROCTOSOL HC) 2.5 % rectal cream  01/08/24  This prescription was not prescribed by you

## 2024-09-04 ENCOUNTER — Encounter: Payer: Self-pay | Admitting: Cardiology

## 2024-09-04 DIAGNOSIS — E669 Obesity, unspecified: Secondary | ICD-10-CM | POA: Diagnosis not present

## 2024-09-04 DIAGNOSIS — I4892 Unspecified atrial flutter: Secondary | ICD-10-CM | POA: Diagnosis not present

## 2024-09-04 DIAGNOSIS — Z9181 History of falling: Secondary | ICD-10-CM | POA: Diagnosis not present

## 2024-09-04 DIAGNOSIS — I5033 Acute on chronic diastolic (congestive) heart failure: Secondary | ICD-10-CM | POA: Diagnosis not present

## 2024-09-04 DIAGNOSIS — E876 Hypokalemia: Secondary | ICD-10-CM | POA: Diagnosis not present

## 2024-09-04 DIAGNOSIS — J9601 Acute respiratory failure with hypoxia: Secondary | ICD-10-CM | POA: Diagnosis not present

## 2024-09-04 DIAGNOSIS — I480 Paroxysmal atrial fibrillation: Secondary | ICD-10-CM | POA: Diagnosis not present

## 2024-09-04 DIAGNOSIS — Z7901 Long term (current) use of anticoagulants: Secondary | ICD-10-CM | POA: Diagnosis not present

## 2024-09-04 DIAGNOSIS — E785 Hyperlipidemia, unspecified: Secondary | ICD-10-CM | POA: Diagnosis not present

## 2024-09-04 DIAGNOSIS — J9622 Acute and chronic respiratory failure with hypercapnia: Secondary | ICD-10-CM | POA: Diagnosis not present

## 2024-09-04 DIAGNOSIS — Z6834 Body mass index (BMI) 34.0-34.9, adult: Secondary | ICD-10-CM | POA: Diagnosis not present

## 2024-09-04 DIAGNOSIS — C44320 Squamous cell carcinoma of skin of unspecified parts of face: Secondary | ICD-10-CM | POA: Diagnosis not present

## 2024-09-04 DIAGNOSIS — I110 Hypertensive heart disease with heart failure: Secondary | ICD-10-CM | POA: Diagnosis not present

## 2024-09-04 DIAGNOSIS — G4733 Obstructive sleep apnea (adult) (pediatric): Secondary | ICD-10-CM | POA: Diagnosis not present

## 2024-09-04 DIAGNOSIS — I2510 Atherosclerotic heart disease of native coronary artery without angina pectoris: Secondary | ICD-10-CM | POA: Diagnosis not present

## 2024-09-05 ENCOUNTER — Telehealth: Payer: Self-pay | Admitting: Emergency Medicine

## 2024-09-05 NOTE — Telephone Encounter (Signed)
 Copied from CRM [id number]. Topic: Clinical - Order For Equipment >> Sep 05, 2024 11:45 AM Todd Gonzales wrote: Reason for CRM: Patient is calling to request that the pressure on his bipap be backed down a level as it is too strong.  Please contact patient to inform.

## 2024-09-05 NOTE — Telephone Encounter (Signed)
 Called and spoke with the patient and his wife.  Reviewed notes from the last office visit with Dr. Anner and confirmed restarting Amiodarone  200 mg once daily per Dr. Genice instructions.  Patient and his wife confirm holding Amiodarone  for one week and taking Metoprolol  Tartrate 25 mg BID for that one week while he was off of Amiodarone .  Patient will resume Amiodarone  200 mg once daily today.

## 2024-09-11 NOTE — Telephone Encounter (Signed)
 Noted. Nothing further needed.

## 2024-09-12 ENCOUNTER — Emergency Department

## 2024-09-12 ENCOUNTER — Encounter: Payer: Self-pay | Admitting: Student

## 2024-09-12 ENCOUNTER — Inpatient Hospital Stay

## 2024-09-12 ENCOUNTER — Encounter: Payer: Self-pay | Admitting: Sleep Medicine

## 2024-09-12 ENCOUNTER — Other Ambulatory Visit: Payer: Self-pay

## 2024-09-12 ENCOUNTER — Inpatient Hospital Stay
Admission: EM | Admit: 2024-09-12 | Discharge: 2024-09-14 | DRG: 189 | Disposition: A | Discharge status: Home / Self Care | Attending: Student | Admitting: Student

## 2024-09-12 DIAGNOSIS — Z79899 Other long term (current) drug therapy: Secondary | ICD-10-CM | POA: Diagnosis not present

## 2024-09-12 DIAGNOSIS — I4819 Other persistent atrial fibrillation: Secondary | ICD-10-CM | POA: Diagnosis present

## 2024-09-12 DIAGNOSIS — Z825 Family history of asthma and other chronic lower respiratory diseases: Secondary | ICD-10-CM | POA: Diagnosis not present

## 2024-09-12 DIAGNOSIS — K5900 Constipation, unspecified: Secondary | ICD-10-CM | POA: Diagnosis not present

## 2024-09-12 DIAGNOSIS — J9601 Acute respiratory failure with hypoxia: Secondary | ICD-10-CM | POA: Diagnosis not present

## 2024-09-12 DIAGNOSIS — I2510 Atherosclerotic heart disease of native coronary artery without angina pectoris: Secondary | ICD-10-CM | POA: Diagnosis present

## 2024-09-12 DIAGNOSIS — Z6836 Body mass index (BMI) 36.0-36.9, adult: Secondary | ICD-10-CM | POA: Diagnosis not present

## 2024-09-12 DIAGNOSIS — E66812 Obesity, class 2: Secondary | ICD-10-CM | POA: Diagnosis present

## 2024-09-12 DIAGNOSIS — Z1152 Encounter for screening for COVID-19: Secondary | ICD-10-CM

## 2024-09-12 DIAGNOSIS — J9600 Acute respiratory failure, unspecified whether with hypoxia or hypercapnia: Secondary | ICD-10-CM | POA: Diagnosis present

## 2024-09-12 DIAGNOSIS — E785 Hyperlipidemia, unspecified: Secondary | ICD-10-CM | POA: Diagnosis present

## 2024-09-12 DIAGNOSIS — J189 Pneumonia, unspecified organism: Secondary | ICD-10-CM | POA: Diagnosis not present

## 2024-09-12 DIAGNOSIS — Z881 Allergy status to other antibiotic agents status: Secondary | ICD-10-CM | POA: Diagnosis not present

## 2024-09-12 DIAGNOSIS — Z23 Encounter for immunization: Secondary | ICD-10-CM

## 2024-09-12 DIAGNOSIS — E662 Morbid (severe) obesity with alveolar hypoventilation: Secondary | ICD-10-CM | POA: Diagnosis present

## 2024-09-12 DIAGNOSIS — J440 Chronic obstructive pulmonary disease with acute lower respiratory infection: Secondary | ICD-10-CM | POA: Diagnosis not present

## 2024-09-12 DIAGNOSIS — J9602 Acute respiratory failure with hypercapnia: Secondary | ICD-10-CM | POA: Diagnosis present

## 2024-09-12 DIAGNOSIS — R0989 Other specified symptoms and signs involving the circulatory and respiratory systems: Secondary | ICD-10-CM | POA: Diagnosis not present

## 2024-09-12 DIAGNOSIS — J441 Chronic obstructive pulmonary disease with (acute) exacerbation: Secondary | ICD-10-CM | POA: Diagnosis not present

## 2024-09-12 DIAGNOSIS — N400 Enlarged prostate without lower urinary tract symptoms: Secondary | ICD-10-CM | POA: Diagnosis present

## 2024-09-12 DIAGNOSIS — I5032 Chronic diastolic (congestive) heart failure: Secondary | ICD-10-CM | POA: Diagnosis present

## 2024-09-12 DIAGNOSIS — Z85828 Personal history of other malignant neoplasm of skin: Secondary | ICD-10-CM

## 2024-09-12 DIAGNOSIS — A419 Sepsis, unspecified organism: Secondary | ICD-10-CM | POA: Diagnosis not present

## 2024-09-12 DIAGNOSIS — Z7901 Long term (current) use of anticoagulants: Secondary | ICD-10-CM | POA: Diagnosis not present

## 2024-09-12 DIAGNOSIS — I110 Hypertensive heart disease with heart failure: Secondary | ICD-10-CM | POA: Diagnosis not present

## 2024-09-12 DIAGNOSIS — Z96642 Presence of left artificial hip joint: Secondary | ICD-10-CM | POA: Diagnosis present

## 2024-09-12 DIAGNOSIS — J9811 Atelectasis: Secondary | ICD-10-CM | POA: Diagnosis not present

## 2024-09-12 DIAGNOSIS — Z96653 Presence of artificial knee joint, bilateral: Secondary | ICD-10-CM | POA: Diagnosis not present

## 2024-09-12 DIAGNOSIS — G4733 Obstructive sleep apnea (adult) (pediatric): Secondary | ICD-10-CM | POA: Diagnosis not present

## 2024-09-12 DIAGNOSIS — J90 Pleural effusion, not elsewhere classified: Secondary | ICD-10-CM | POA: Diagnosis not present

## 2024-09-12 LAB — IRON AND TIBC
Iron: 68 ug/dL (ref 45–182)
Saturation Ratios: 24 % (ref 17.9–39.5)
TIBC: 290 ug/dL (ref 250–450)
UIBC: 222 ug/dL

## 2024-09-12 LAB — CBC WITH DIFFERENTIAL/PLATELET
Abs Immature Granulocytes: 0.03 K/uL (ref 0.00–0.07)
Basophils Absolute: 0 K/uL (ref 0.0–0.1)
Basophils Relative: 0 %
Eosinophils Absolute: 0.1 K/uL (ref 0.0–0.5)
Eosinophils Relative: 2 %
HCT: 45 % (ref 39.0–52.0)
Hemoglobin: 14.1 g/dL (ref 13.0–17.0)
Immature Granulocytes: 1 %
Lymphocytes Relative: 17 %
Lymphs Abs: 1.1 K/uL (ref 0.7–4.0)
MCH: 31.7 pg (ref 26.0–34.0)
MCHC: 31.3 g/dL (ref 30.0–36.0)
MCV: 101.1 fL — ABNORMAL HIGH (ref 80.0–100.0)
Monocytes Absolute: 0.5 K/uL (ref 0.1–1.0)
Monocytes Relative: 8 %
Neutro Abs: 4.7 K/uL (ref 1.7–7.7)
Neutrophils Relative %: 72 %
Platelets: 167 K/uL (ref 150–400)
RBC: 4.45 MIL/uL (ref 4.22–5.81)
RDW: 13.2 % (ref 11.5–15.5)
WBC: 6.4 K/uL (ref 4.0–10.5)
nRBC: 0 % (ref 0.0–0.2)

## 2024-09-12 LAB — COMPREHENSIVE METABOLIC PANEL WITH GFR
ALT: 9 U/L (ref 0–44)
AST: 16 U/L (ref 15–41)
Albumin: 3.5 g/dL (ref 3.5–5.0)
Alkaline Phosphatase: 42 U/L (ref 38–126)
Anion gap: 8 (ref 5–15)
BUN: 16 mg/dL (ref 8–23)
CO2: 32 mmol/L (ref 22–32)
Calcium: 8.4 mg/dL — ABNORMAL LOW (ref 8.9–10.3)
Chloride: 101 mmol/L (ref 98–111)
Creatinine, Ser: 0.99 mg/dL (ref 0.61–1.24)
GFR, Estimated: >60 mL/min (ref >60)
Glucose, Bld: 119 mg/dL — ABNORMAL HIGH (ref 70–99)
Potassium: 5 mmol/L (ref 3.5–5.1)
Sodium: 141 mmol/L (ref 135–145)
Total Bilirubin: 1.5 mg/dL — ABNORMAL HIGH (ref 0.0–1.2)
Total Protein: 6.8 g/dL (ref 6.5–8.1)

## 2024-09-12 LAB — URINALYSIS, W/ REFLEX TO CULTURE (INFECTION SUSPECTED)
Bacteria, UA: NONE SEEN
Bilirubin Urine: NEGATIVE
Glucose, UA: NEGATIVE mg/dL
Hgb urine dipstick: NEGATIVE
Ketones, ur: NEGATIVE mg/dL
Leukocytes,Ua: NEGATIVE
Nitrite: NEGATIVE
Protein, ur: NEGATIVE mg/dL
Specific Gravity, Urine: 1.016 (ref 1.005–1.030)
pH: 5 (ref 5.0–8.0)

## 2024-09-12 LAB — BLOOD GAS, ARTERIAL
Acid-Base Excess: 7 mmol/L — ABNORMAL HIGH (ref 0.0–2.0)
Bicarbonate: 34.8 mmol/L — ABNORMAL HIGH (ref 20.0–28.0)
O2 Content: 2 L/min
O2 Saturation: 92.9 %
Patient temperature: 37
pCO2 arterial: 63 mmHg — ABNORMAL HIGH (ref 32–48)
pH, Arterial: 7.35 (ref 7.35–7.45)
pO2, Arterial: 75 mmHg — ABNORMAL LOW (ref 83–108)

## 2024-09-12 LAB — RESPIRATORY PANEL BY PCR

## 2024-09-12 LAB — BRAIN NATRIURETIC PEPTIDE: B Natriuretic Peptide: 120 pg/mL — ABNORMAL HIGH (ref 0.0–100.0)

## 2024-09-12 LAB — APTT: aPTT: 30 s (ref 24–36)

## 2024-09-12 LAB — FOLATE: Folate: 9.7 ng/mL

## 2024-09-12 LAB — RESP PANEL BY RT-PCR (RSV, FLU A&B, COVID)  RVPGX2
Influenza A by PCR: NEGATIVE
Influenza B by PCR: NEGATIVE
Resp Syncytial Virus by PCR: NEGATIVE
SARS Coronavirus 2 by RT PCR: NEGATIVE

## 2024-09-12 LAB — TROPONIN I (HIGH SENSITIVITY)
Troponin I (High Sensitivity): 5 ng/L (ref <18)
Troponin I (High Sensitivity): 6 ng/L (ref <18)

## 2024-09-12 LAB — PHOSPHORUS: Phosphorus: 4 mg/dL (ref 2.5–4.6)

## 2024-09-12 LAB — VITAMIN B12: Vitamin B-12: 386 pg/mL (ref 180–914)

## 2024-09-12 LAB — LACTIC ACID, PLASMA: Lactic Acid, Venous: 0.7 mmol/L (ref 0.5–1.9)

## 2024-09-12 LAB — VITAMIN D 25 HYDROXY (VIT D DEFICIENCY, FRACTURES): Vit D, 25-Hydroxy: 49.29 ng/mL (ref 30–100)

## 2024-09-12 LAB — MAGNESIUM: Magnesium: 2.2 mg/dL (ref 1.7–2.4)

## 2024-09-12 LAB — PROTIME-INR
INR: 1.4 — ABNORMAL HIGH (ref 0.8–1.2)
Prothrombin Time: 17.7 s — ABNORMAL HIGH (ref 11.4–15.2)

## 2024-09-12 MED ORDER — ONDANSETRON HCL 4 MG/2ML IJ SOLN: 4.0000 mg | Freq: Four times a day (QID) | INTRAMUSCULAR | Status: DC | PRN

## 2024-09-12 MED ORDER — ACETAMINOPHEN 325 MG PO TABS: 650.0000 mg | ORAL_TABLET | Freq: Four times a day (QID) | ORAL | Status: DC | PRN

## 2024-09-12 MED ORDER — ONDANSETRON HCL 4 MG PO TABS: 4.0000 mg | ORAL_TABLET | Freq: Four times a day (QID) | ORAL | Status: DC | PRN

## 2024-09-12 MED ORDER — BISACODYL 5 MG PO TBEC
10.0000 mg | DELAYED_RELEASE_TABLET | Freq: Every evening | ORAL | Status: DC
  Filled 2024-09-12: qty 2

## 2024-09-12 MED ORDER — AMIODARONE HCL 200 MG PO TABS
200.0000 mg | ORAL_TABLET | Freq: Every day | ORAL | Status: DC
  Administered 2024-09-12 – 2024-09-14 (×3): 200 mg via ORAL
  Filled 2024-09-12 (×3): qty 1

## 2024-09-12 MED ORDER — GUAIFENESIN ER 600 MG PO TB12
600.0000 mg | ORAL_TABLET | Freq: Two times a day (BID) | ORAL | Status: DC
  Administered 2024-09-12 – 2024-09-14 (×4): 600 mg via ORAL
  Filled 2024-09-12 (×5): qty 1

## 2024-09-12 MED ORDER — METHYLPREDNISOLONE SODIUM SUCC 40 MG IJ SOLR
40.0000 mg | Freq: Two times a day (BID) | INTRAMUSCULAR | Status: AC
  Administered 2024-09-12 – 2024-09-13 (×3): 40 mg via INTRAVENOUS
  Filled 2024-09-12 (×3): qty 1

## 2024-09-12 MED ORDER — ENOXAPARIN SODIUM 40 MG/0.4ML IJ SOSY: 40.0000 mg | PREFILLED_SYRINGE | INTRAMUSCULAR | Status: DC

## 2024-09-12 MED ORDER — HYDROCOD POLI-CHLORPHE POLI ER 10-8 MG/5ML PO SUER
5.0000 mL | Freq: Two times a day (BID) | ORAL | Status: DC | PRN
  Administered 2024-09-13 – 2024-09-14 (×2): 5 mL via ORAL
  Filled 2024-09-12 (×2): qty 5

## 2024-09-12 MED ORDER — FLUTICASONE FUROATE-VILANTEROL 100-25 MCG/ACT IN AEPB
1.0000 | INHALATION_SPRAY | Freq: Every day | RESPIRATORY_TRACT | Status: DC
  Administered 2024-09-12 – 2024-09-14 (×2): 1 via RESPIRATORY_TRACT
  Filled 2024-09-12: qty 28

## 2024-09-12 MED ORDER — TAMSULOSIN HCL 0.4 MG PO CAPS
0.4000 mg | ORAL_CAPSULE | Freq: Every day | ORAL | Status: DC
  Administered 2024-09-12 – 2024-09-14 (×3): 0.4 mg via ORAL
  Filled 2024-09-12 (×3): qty 1

## 2024-09-12 MED ORDER — SODIUM CHLORIDE 0.9 % IV SOLN
2.0000 g | Freq: Once | INTRAVENOUS | Status: AC
  Administered 2024-09-12: 2 g via INTRAVENOUS
  Filled 2024-09-12: qty 20

## 2024-09-12 MED ORDER — POLYETHYLENE GLYCOL 3350 17 G PO PACK
17.0000 g | PACK | Freq: Two times a day (BID) | ORAL | Status: DC
  Filled 2024-09-12 (×2): qty 1

## 2024-09-12 MED ORDER — PREDNISONE 20 MG PO TABS
40.0000 mg | ORAL_TABLET | Freq: Every day | ORAL | Status: DC
  Administered 2024-09-14: 40 mg via ORAL
  Filled 2024-09-12: qty 2

## 2024-09-12 MED ORDER — IPRATROPIUM-ALBUTEROL 0.5-2.5 (3) MG/3ML IN SOLN
3.0000 mL | Freq: Three times a day (TID) | RESPIRATORY_TRACT | Status: DC
  Administered 2024-09-13 – 2024-09-14 (×4): 3 mL via RESPIRATORY_TRACT
  Filled 2024-09-12 (×4): qty 3

## 2024-09-12 MED ORDER — SODIUM CHLORIDE 0.9 % IV SOLN
500.0000 mg | Freq: Once | INTRAVENOUS | Status: AC
  Administered 2024-09-12: 500 mg via INTRAVENOUS
  Filled 2024-09-12: qty 5

## 2024-09-12 MED ORDER — POLYETHYLENE GLYCOL 3350 17 G PO PACK: 17.0000 g | PACK | Freq: Every day | ORAL | Status: DC | PRN

## 2024-09-12 MED ORDER — ENOXAPARIN SODIUM 60 MG/0.6ML IJ SOSY: 0.5000 mg/kg | PREFILLED_SYRINGE | INTRAMUSCULAR | Status: DC

## 2024-09-12 MED ORDER — GABAPENTIN 300 MG PO CAPS
300.0000 mg | ORAL_CAPSULE | Freq: Every day | ORAL | Status: DC
  Administered 2024-09-12 – 2024-09-13 (×2): 300 mg via ORAL
  Filled 2024-09-12 (×2): qty 1

## 2024-09-12 MED ORDER — AZITHROMYCIN 500 MG IV SOLR
500.0000 mg | INTRAVENOUS | Status: DC
  Administered 2024-09-13: 500 mg via INTRAVENOUS
  Filled 2024-09-12 (×2): qty 5

## 2024-09-12 MED ORDER — SODIUM CHLORIDE 0.9% FLUSH: 3.0000 mL | INTRAVENOUS | Status: DC | PRN

## 2024-09-12 MED ORDER — IPRATROPIUM-ALBUTEROL 0.5-2.5 (3) MG/3ML IN SOLN
3.0000 mL | Freq: Four times a day (QID) | RESPIRATORY_TRACT | Status: DC
  Administered 2024-09-12 (×2): 3 mL via RESPIRATORY_TRACT
  Filled 2024-09-12 (×2): qty 3

## 2024-09-12 MED ORDER — ACETAMINOPHEN 650 MG RE SUPP: 650.0000 mg | Freq: Four times a day (QID) | RECTAL | Status: DC | PRN

## 2024-09-12 MED ORDER — BISACODYL 5 MG PO TBEC: 5.0000 mg | DELAYED_RELEASE_TABLET | Freq: Every day | ORAL | Status: DC | PRN

## 2024-09-12 MED ORDER — APIXABAN 5 MG PO TABS
5.0000 mg | ORAL_TABLET | Freq: Two times a day (BID) | ORAL | Status: DC
  Administered 2024-09-12 – 2024-09-14 (×4): 5 mg via ORAL
  Filled 2024-09-12 (×4): qty 1

## 2024-09-12 MED ORDER — SODIUM CHLORIDE 0.9 % IV SOLN: 250.0000 mL | INTRAVENOUS | Status: AC | PRN

## 2024-09-12 MED ORDER — IPRATROPIUM-ALBUTEROL 0.5-2.5 (3) MG/3ML IN SOLN: 3.0000 mL | Freq: Four times a day (QID) | RESPIRATORY_TRACT | Status: DC | PRN

## 2024-09-12 MED ORDER — HYDRALAZINE HCL 20 MG/ML IJ SOLN: 10.0000 mg | Freq: Four times a day (QID) | INTRAMUSCULAR | Status: DC | PRN

## 2024-09-12 MED ORDER — FUROSEMIDE 40 MG PO TABS
20.0000 mg | ORAL_TABLET | Freq: Every day | ORAL | Status: DC
  Administered 2024-09-12 – 2024-09-14 (×3): 20 mg via ORAL
  Filled 2024-09-12 (×3): qty 1

## 2024-09-12 MED ORDER — SODIUM CHLORIDE 0.9% FLUSH
3.0000 mL | Freq: Two times a day (BID) | INTRAVENOUS | Status: DC
  Administered 2024-09-12 – 2024-09-14 (×3): 3 mL via INTRAVENOUS

## 2024-09-12 NOTE — Progress Notes (Signed)
 PHARMACIST - PHYSICIAN COMMUNICATION  CONCERNING:  Enoxaparin  (Lovenox ) for DVT Prophylaxis    RECOMMENDATION: Patient was prescribed enoxaprin 40mg  q24 hours for VTE prophylaxis.   Filed Weights   09/12/24 1037  Weight: 115.3 kg (254 lb 4.8 oz)    Body mass index is 36.49 kg/m.  Estimated Creatinine Clearance: 69.4 mL/min (by C-G formula based on SCr of 0.99 mg/dL).   Based on [facility] policy patient is candidate for enoxaparin  0.5mg /kg TBW SQ every 24 hours based on BMI being >30.   DESCRIPTION: Pharmacy has adjusted enoxaparin  dose per [facility] policy.  Patient is now receiving enoxaparin  0.5 mg/kg every 24 hours    Adriana JONETTA Bolster, PharmD Clinical Pharmacist  09/12/2024 2:54 PM

## 2024-09-12 NOTE — ED Provider Notes (Addendum)
 [facility] Provider Note    Event Date/Time   First MD Initiated Contact with Patient 09/12/24 1036     (approximate)   History   Chief Complaint: Weakness   HPI  Todd Gonzales is a 83 y.o. male with a history of COPD, obesity, hypertension, A-fib on amiodarone  and Eliquis  who was brought to the ED due to weakness, chills, lightheadedness, shortness of breath, breathing congestion for last 2 days, worsening.  First responders noted oxygen saturation of 70% on room air.  Received a shingles vaccine 2 days ago.  Denies chest pain or other pain complaints, no vomiting or diarrhea.        Past Medical History:  Diagnosis Date   Actinic keratosis 03/28/2023   glabella, needs LN2   Acute hypoxemic respiratory failure (HCC)    Aortic atherosclerosis    a. Noted on CT 02/2023.   Arthritis    Basal cell carcinoma 03/28/2023   right paranasal, no tx, cleared with bx   BPH (benign prostatic hyperplasia)    nocturia x4 at baseline   CAD (coronary artery disease)    a. 08/2023 Cor CTA: LM nl, LAD 25-49, LCX <25, RCA 25-49. Ca2+ = 409 (43rd %'ile).   Chronic heart failure with preserved ejection fraction (HFpEF) (HCC)    a. 04/2023 Echo: EF 60-65%, no rwma, mild LVH, nl RV fxn, triv MR; b. 12/2023 TEE: EF 60-65%, nl RV size/fxn, mildly dil LA, no LA/LAA thrombus, mild-mod MR, mild AI; c. 07/2014 Echo: EF 60-65%, no rwma, GrI DD, nl RV fxn.   COPD (chronic obstructive pulmonary disease) (HCC)    Grade I diastolic dysfunction    H/O hiatal hernia    Hemorrhoids    History of cardioversion    Hyperlipidemia    Hypertension    Dr Tisovec- LOV with clearance 3/13 on chart   IGT (impaired glucose tolerance)    last AIC 5.5- diet controlled- per office note 3/13   Incomplete right bundle branch block (RBBB)    Mitral regurgitation    a. 12/2023 TEE: mild to mod MR in setting of afib.   Obesity (BMI 30-39.9)    Obstructive sleep apnea    OSA on CPAP    OSA treated  with BiPAP    Paroxysmal atrial fibrillation (HCC) 04/14/2023   a. 04/2023 Admitted with A-fib RVR.  Normal echo.  Discharged on amiodarone ; b. 05/2023 Zio: Predominantly sinus rhythm at 66 (50-88).  First-degree AV block.  4 beats of NSVT.  No sustained arrhythmias.  Amiodarone  discontinued; c. 12/2023 recurrent Afib->amio->TEE/DCCV.   Paroxysmal atrial fibrillation (HCC)    SCC (squamous cell carcinoma) 02/10/2005   scc in situ-left forearm.   SCC (squamous cell carcinoma) 02/12/2024   L lateral upper pretibia, EDC   SCCA (squamous cell carcinoma) of skin 02/10/2005   Right Outer Eye (in situ) (curet and excision)   SCCA (squamous cell carcinoma) of skin 02/13/2006   Right Ear (well diff) (curet and 5FU)   SCCA (squamous cell carcinoma) of skin 04/05/2006   Left Ear Bowl (in situ) (tx p bx)   SCCA (squamous cell carcinoma) of skin 07/06/2010   Right Cheek (in situ) (curet and zyclara)   SCCA (squamous cell carcinoma) of skin 04/27/2009   Left Upper Forearm (in situ) (curet 5FU)   SCCA (squamous cell carcinoma) of skin 08/28/2014   Right Cheek (well diff)   SCCA (squamous cell carcinoma) of skin 03/21/2016   Left Jawline Ant (well diff) (curet  and 5FU)   SCCA (squamous cell carcinoma) of skin 01/19/2017   Right Hand (in situ) (tx p bx)   SCCA (squamous cell carcinoma) of skin 03/04/1840   Left Jawline Inf (in situ) (curet and 5FU)   SCCA (squamous cell carcinoma) of skin 12/23/2020   Left Forehead (well diff) (tx p bx)   SCCA (squamous cell carcinoma) of skin 12/23/2020   Left Submandibular Area (in situ) (tx p bx)   Seasonal allergies    Spinal stenosis    Squamous cell carcinoma of skin 08/31/2020   in situ-left jawline cheek   Squamous cell carcinoma of skin 04/27/2021   infil- left nasal sidewall(MOHS)   Squamous cell carcinoma of skin 03/28/2023   Left medial cheek above nasolabial, not treated, but cleared with bx   Squamous cell carcinoma of skin 08/16/2023   Left lateral  upper pretibia, EDC   Squamous cell carcinoma of skin 05/28/2024   SCC IS,  Left Upper Temple, [facility] 06/10/24   Squamous cell carcinoma of skin of face 04/27/2021   infil- Left Temporal scalp (MOHS   UTI (lower urinary tract infection)    required admission to [facility]    Current Outpatient Rx   Order #: [id number] Class: Historical Med   Order #: [id number] Class: Normal   Order #: [id number] Class: No Print   Order #: [id number] Class: Historical Med   Order #: [id number] Class: Normal   Order #: [id number] Class: Normal   Order #: [id number] Class: No Print   Order #: [id number] Class: Normal   Order #: [id number] Class: Normal   Order #: [id number] Class: Normal   Order #: [id number] Class: No Print   Order #: [id number] Class: Normal   Order #: [id number] Class: Historical Med   Order #: [id number] Class: Normal   Order #: [id number] Class: Normal    Past Surgical History:  Procedure Laterality Date   BACK SURGERY  2008   Laminectomy L4-5   CARDIOVERSION N/A 12/22/2023   Procedure: CARDIOVERSION;  Surgeon: Darliss Rogue, MD;  Location: [facility] ORS;  Service: Cardiovascular;  Laterality: N/A;   CARDIOVERSION N/A 08/20/2024   Procedure: CARDIOVERSION;  Surgeon: Argentina Clap, MD;  Location: [facility] ORS;  Service: Cardiovascular;  Laterality: N/A;   JOINT REPLACEMENT Bilateral    bil   KNEE ARTHROSCOPY     right   LUMBAR LAMINECTOMY/DECOMPRESSION MICRODISCECTOMY Bilateral 11/20/2018   Procedure: Bilateral Thoracic Twelve to Lumbar One Laminectomy;  Surgeon: Colon Shove, MD;  [facility];  Service: Neurosurgery;  Laterality: Bilateral;  posterior   TEE WITHOUT CARDIOVERSION N/A 12/22/2023   Procedure: TRANSESOPHAGEAL ECHOCARDIOGRAM (TEE);  Surgeon: Darliss Rogue, MD;  Location: [facility] ORS;  Service: Cardiovascular;  Laterality: N/A;   TEE WITHOUT CARDIOVERSION N/A 08/20/2024   Procedure: ECHOCARDIOGRAM, TRANSESOPHAGEAL;  Surgeon: Argentina Clap, MD;  Location: [facility] ORS;  Service: Cardiovascular;   Laterality: N/A;   TOTAL HIP ARTHROPLASTY  03/20/2012   Procedure: TOTAL HIP ARTHROPLASTY ANTERIOR APPROACH;  Surgeon: Donnice JONETTA Car, MD;  Location: [facility] ORS;  Service: Orthopedics;  Laterality: Left;    Physical Exam   Triage Vital Signs: ED Triage Vitals  Encounter Vitals Group     BP 09/12/24 1036 (!) 142/71     Girls Systolic BP Percentile --      Girls Diastolic BP Percentile --      Boys Systolic BP Percentile --      Boys Diastolic BP Percentile --      Pulse Rate 09/12/24 1036 80     Resp 09/12/24 1036 19     Temp 09/12/24 1036 (!)  100.7 F (38.2 C)     Temp Source 09/12/24 1036 Oral     SpO2 09/12/24 1036 100 %     Weight 09/12/24 1037 254 lb 4.8 oz (115.3 kg)     Height 09/12/24 1037 5' 10 (1.778 m)     Head Circumference --      Peak Flow --      Pain Score 09/12/24 1037 0     Pain Loc --      Pain Education --      Exclude from Growth Chart --     Most recent vital signs: Vitals:   09/12/24 1130 09/12/24 1200  BP: 133/70 105/82  Pulse: 77 74  Resp:    Temp:    SpO2: 96% 97%    General: Awake, no distress.  CV:  Good peripheral perfusion.  Regular rate rhythm, normal distal pulses Resp:  Normal effort.  Crackles at the right base.  Decreased breath sounds in the lower lungs. Abd:  No distention.  Soft nontender Other:  No lower extremity edema.  Moist oral mucosa   ED Results / Procedures / Treatments   Labs (all labs ordered are listed, but only abnormal results are displayed) Labs Reviewed  COMPREHENSIVE METABOLIC PANEL WITH GFR - Abnormal; Notable for the following components:      Result Value   Glucose, Bld 119 (*)    Calcium  8.4 (*)    Total Bilirubin 1.5 (*)    All other components within normal limits  CBC WITH DIFFERENTIAL/PLATELET - Abnormal; Notable for the following components:   MCV 101.1 (*)    All other components within normal limits  PROTIME-INR - Abnormal; Notable for the following components:   Prothrombin Time 17.7 (*)     INR 1.4 (*)    All other components within normal limits  URINALYSIS, W/ REFLEX TO CULTURE (INFECTION SUSPECTED) - Abnormal; Notable for the following components:   Color, Urine YELLOW (*)    APPearance CLEAR (*)    All other components within normal limits  BRAIN NATRIURETIC PEPTIDE - Abnormal; Notable for the following components:   B Natriuretic Peptide 120.0 (*)    All other components within normal limits  RESP PANEL BY RT-PCR (RSV, FLU A&B, COVID)  [id number]  CULTURE, BLOOD (ROUTINE X 2)  CULTURE, BLOOD (ROUTINE X 2)  LACTIC ACID, PLASMA  APTT  TROPONIN I (HIGH SENSITIVITY)  TROPONIN I (HIGH SENSITIVITY)     EKG Interpreted by me Sinus rhythm, rate of 80.  Normal axis intervals QRS ST segments T waves.  Extensive artifact limits interpretation.   RADIOLOGY Chest x-ray interpreted by me, no obvious infiltrate.  Radiology report reviewed   PROCEDURES:  .Critical Care  Performed by: Viviann Pastor, MD Authorized by: Viviann Pastor, MD   Critical care provider statement:    Critical care time (minutes):  35   Critical care time was exclusive of:  Separately billable procedures and treating other patients   Critical care was necessary to treat or prevent imminent or life-threatening deterioration of the following conditions:  Sepsis and respiratory failure   Critical care was time spent personally by me on the following activities:  Development of treatment plan with patient or surrogate, discussions with consultants, evaluation of patient's response to treatment, examination of patient, obtaining history from patient or surrogate, ordering and performing treatments and interventions, ordering and review of laboratory studies, ordering and review of radiographic studies, pulse oximetry, re-evaluation of patient's condition and review of old charts  Care discussed with: admitting provider      MEDICATIONS ORDERED IN ED: Medications  cefTRIAXone (ROCEPHIN) 2 g in  sodium chloride  0.9 % 100 mL IVPB (0 g Intravenous Stopped 09/12/24 1320)  azithromycin (ZITHROMAX) 500 mg in sodium chloride  0.9 % 250 mL IVPB (500 mg Intravenous New Bag/Given 09/12/24 1121)     IMPRESSION / MDM / ASSESSMENT AND PLAN / ED COURSE  I reviewed the triage vital signs and the nursing notes.  DDx: Pneumonia, pleural effusion, pulmonary edema, CHF exacerbation, COVID, influenza, vaccine side effects, AKI, electrolyte derangement, anemia  Patient's presentation is most consistent with acute presentation with potential threat to life or bodily function.  Patient presents with shortness of breath, fatigue, hypoxia observed at home.  On arrival he is found to be febrile at 100.7.  Sepsis workup undertaken which does not reveal any apparent infectious source   Clinical Course as of 09/12/24 1419  Thu Sep 12, 2024  1412 Still 89% on RA. Still feels very fatigued and SOB. Will obtain CT chest w/o (on eliquis . PE unlikely) and plan to admit [PS]    Clinical Course User Index [PS] Viviann Pastor, MD    ----------------------------------------- 2:55 PM on 09/12/2024 ----------------------------------------- Case discussed with hospitalist   FINAL CLINICAL IMPRESSION(S) / ED DIAGNOSES   Final diagnoses:  Acute respiratory failure with hypoxia (HCC)     Rx / DC Orders   ED Discharge Orders     None        Note:  This document was prepared using Dragon voice recognition software and may include unintentional dictation errors.   Viviann Pastor, MD 09/12/24 1419    Viviann Pastor, MD 09/12/24 1455

## 2024-09-12 NOTE — H&P (Signed)
 [facility] History and Physical   Patient: Todd Gonzales FMW:[id number]   PCP: Cleatus Arlyss RAMAN, MD DOB: [birth year]   DOA: 09/12/2024   DOS: 09/12/2024   DOS: the patient was seen and examined on 09/12/2024  Patient coming from: The patient is coming from Home  Chief Complaint: Shortness of breath, low oxygen 69% on room air at home  HPI: Todd Gonzales is a 83 y.o. male with PMH of CAD, HTN, A-fib, grade 1 DD, COPD, BPH, on BiPAP at night, as reviewed from EMR, patient presented at [facility] ED on 10/9 with feeling weak, chills, shortness of breath and lightheadedness.  Patient is having the symptoms for past 2 days, today patient was not feeling well so his wife checked his pulse ox and it was 67% so patient was brought in to the ED.  Currently patient is saturating well on 2 L oxygen, still has significant shortness of breath and feels chest congestion.  Denied any chest pain, no new other complaints.   ED Course: VS Tmax 100.7, HR 80, RR 19-26, BP 142/71, 100% on 2 L via nasal cannula CMP, glucose 119, calcium  8.4, rest within normal range CBC no leukocytosis, Hb stable, MCV 101.1 elevated Troponin x 1 negative BNP 120, INR 1.4 Negative COVID, flu and RSV UA negative CXR: Very low lung volumes with vascular crowding and bibasilar atelectasis and scarring. CT chest is pending Blood cultures collected   Review of Systems: as mentioned in the history of present illness.  All other systems reviewed and are negative.  Past Medical History:  Diagnosis Date   Actinic keratosis 03/28/2023   glabella, needs LN2   Acute hypoxemic respiratory failure (HCC)    Aortic atherosclerosis    a. Noted on CT 02/2023.   Arthritis    Basal cell carcinoma 03/28/2023   right paranasal, no tx, cleared with bx   BPH (benign prostatic hyperplasia)    nocturia x4 at baseline   CAD (coronary artery disease)    a. 08/2023 Cor CTA: LM nl, LAD 25-49, LCX <25, RCA 25-49. Ca2+ = 409 (43rd %'ile).    Chronic heart failure with preserved ejection fraction (HFpEF) (HCC)    a. 04/2023 Echo: EF 60-65%, no rwma, mild LVH, nl RV fxn, triv MR; b. 12/2023 TEE: EF 60-65%, nl RV size/fxn, mildly dil LA, no LA/LAA thrombus, mild-mod MR, mild AI; c. 07/2014 Echo: EF 60-65%, no rwma, GrI DD, nl RV fxn.   COPD (chronic obstructive pulmonary disease) (HCC)    Grade I diastolic dysfunction    H/O hiatal hernia    Hemorrhoids    History of cardioversion    Hyperlipidemia    Hypertension    Dr Tisovec- LOV with clearance 3/13 on chart   IGT (impaired glucose tolerance)    last AIC 5.5- diet controlled- per office note 3/13   Incomplete right bundle branch block (RBBB)    Mitral regurgitation    a. 12/2023 TEE: mild to mod MR in setting of afib.   Obesity (BMI 30-39.9)    Obstructive sleep apnea    OSA on CPAP    OSA treated with BiPAP    Paroxysmal atrial fibrillation (HCC) 04/14/2023   a. 04/2023 Admitted with A-fib RVR.  Normal echo.  Discharged on amiodarone ; b. 05/2023 Zio: Predominantly sinus rhythm at 66 (50-88).  First-degree AV block.  4 beats of NSVT.  No sustained arrhythmias.  Amiodarone  discontinued; c. 12/2023 recurrent Afib->amio->TEE/DCCV.   Paroxysmal atrial fibrillation (HCC)  SCC (squamous cell carcinoma) 02/10/2005   scc in situ-left forearm.   SCC (squamous cell carcinoma) 02/12/2024   L lateral upper pretibia, EDC   SCCA (squamous cell carcinoma) of skin 02/10/2005   Right Outer Eye (in situ) (curet and excision)   SCCA (squamous cell carcinoma) of skin 02/13/2006   Right Ear (well diff) (curet and 5FU)   SCCA (squamous cell carcinoma) of skin 04/05/2006   Left Ear Bowl (in situ) (tx p bx)   SCCA (squamous cell carcinoma) of skin 07/06/2010   Right Cheek (in situ) (curet and zyclara)   SCCA (squamous cell carcinoma) of skin 04/27/2009   Left Upper Forearm (in situ) (curet 5FU)   SCCA (squamous cell carcinoma) of skin 08/28/2014   Right Cheek (well diff)   SCCA (squamous cell  carcinoma) of skin 03/21/2016   Left Jawline Ant (well diff) (curet and 5FU)   SCCA (squamous cell carcinoma) of skin 01/19/2017   Right Hand (in situ) (tx p bx)   SCCA (squamous cell carcinoma) of skin 03/04/1840   Left Jawline Inf (in situ) (curet and 5FU)   SCCA (squamous cell carcinoma) of skin 12/23/2020   Left Forehead (well diff) (tx p bx)   SCCA (squamous cell carcinoma) of skin 12/23/2020   Left Submandibular Area (in situ) (tx p bx)   Seasonal allergies    Spinal stenosis    Squamous cell carcinoma of skin 08/31/2020   in situ-left jawline cheek   Squamous cell carcinoma of skin 04/27/2021   infil- left nasal sidewall(MOHS)   Squamous cell carcinoma of skin 03/28/2023   Left medial cheek above nasolabial, not treated, but cleared with bx   Squamous cell carcinoma of skin 08/16/2023   Left lateral upper pretibia, EDC   Squamous cell carcinoma of skin 05/28/2024   SCC IS,  Left Upper Temple, [facility] 06/10/24   Squamous cell carcinoma of skin of face 04/27/2021   infil- Left Temporal scalp (MOHS   UTI (lower urinary tract infection)    required admission to [facility]   Past Surgical History:  Procedure Laterality Date   BACK SURGERY  2008   Laminectomy L4-5   CARDIOVERSION N/A 12/22/2023   Procedure: CARDIOVERSION;  Surgeon: Darliss Rogue, MD;  Location: [facility] ORS;  Service: Cardiovascular;  Laterality: N/A;   CARDIOVERSION N/A 08/20/2024   Procedure: CARDIOVERSION;  Surgeon: Argentina Clap, MD;  Location: [facility] ORS;  Service: Cardiovascular;  Laterality: N/A;   JOINT REPLACEMENT Bilateral    bil   KNEE ARTHROSCOPY     right   LUMBAR LAMINECTOMY/DECOMPRESSION MICRODISCECTOMY Bilateral 11/20/2018   Procedure: Bilateral Thoracic Twelve to Lumbar One Laminectomy;  Surgeon: Colon Shove, MD;  Location: [facility] OR;  Service: Neurosurgery;  Laterality: Bilateral;  posterior   TEE WITHOUT CARDIOVERSION N/A 12/22/2023   Procedure: TRANSESOPHAGEAL ECHOCARDIOGRAM (TEE);  Surgeon:  Darliss Rogue, MD;  Location: [facility] ORS;  Service: Cardiovascular;  Laterality: N/A;   TEE WITHOUT CARDIOVERSION N/A 08/20/2024   Procedure: ECHOCARDIOGRAM, TRANSESOPHAGEAL;  Surgeon: Argentina Clap, MD;  Location: [facility] ORS;  Service: Cardiovascular;  Laterality: N/A;   TOTAL HIP ARTHROPLASTY  03/20/2012   Procedure: TOTAL HIP ARTHROPLASTY ANTERIOR APPROACH;  Surgeon: Donnice JONETTA Car, MD;  Location: [facility] ORS;  Service: Orthopedics;  Laterality: Left;   Social History:  reports that he has never smoked. He has never used smokeless tobacco. He reports current alcohol use of about 2.0 standard drinks of alcohol per week. He reports that he does not use drugs.  Allergies  Allergen Reactions  Codeine Other (See Comments)    Unknown reaction   Lipitor Darryl.Curls ] Other (See Comments)    Arthralgias    Bactrim [Sulfamethoxazole-Trimethoprim] Rash     Family history reviewed and not pertinent Family History  Problem Relation Age of Onset   Arthritis Mother    Lung cancer Father    Asthma Father    Colon cancer Neg Hx    Prostate cancer Neg Hx      Prior to Admission medications   Medication Sig Start Date End Date Taking? Authorizing Provider  acetaminophen  (TYLENOL ) 650 MG CR tablet Take 1,300 mg by mouth 2 (two) times daily as needed for pain.    [provider]  amiodarone  (PACERONE ) 200 MG tablet TAKE 200 MG TWICE DAILY FOR ONE WEEK, THEN TAKE 200 MG ONCE DAILY. 08/26/24   Vivienne Lonni Ingle, NP  cyanocobalamin  (VITAMIN B12) 1000 MCG/ML injection Inject 1 mL (1,000 mcg total) into the muscle every 30 (thirty) days. 01/26/23   Cleatus Arlyss RAMAN, MD  diclofenac  Sodium (VOLTAREN ) 1 % GEL Apply 1 Application topically every 6 (six) hours as needed (pain). 09/05/16   [provider]  ELIQUIS  5 MG TABS tablet TAKE ONE TABLET BY MOUTH TWICE A DAY 07/30/24   Cleatus Arlyss RAMAN, MD  fluorouracil  (EFUDEX ) 5 % cream Apply topically 2 (two) times daily. Bid 5-7 days to  temples, cheeks, forehead and nose 02/12/24   Jackquline Sawyer, MD  furosemide  (LASIX ) 20 MG tablet Take 1 tablet (20 mg total) by mouth daily. 07/22/24   Jens Durand, MD  furosemide  (LASIX ) 20 MG tablet Take 1 tablet (20 mg total) by mouth daily. Take 1 to 2 tablets daily as directed for weight gain. 08/29/24 11/27/24  Anner Alm ORN, MD  gabapentin  (NEURONTIN ) 300 MG capsule TAKE ONE CAPSULE BY MOUTH ONCE DAILY 05/02/24   Cleatus Arlyss RAMAN, MD  hydrocortisone  (ANUSOL -HC) 2.5 % rectal cream PLACE ONE APPLICATION RECTALLY TWO TIMES DAILY 08/30/24   Cleatus Arlyss RAMAN, MD  metoprolol  tartrate (LOPRESSOR ) 25 MG tablet Take 2 times daily when not taking Amiodarone  08/29/24   Anner Alm ORN, MD  ondansetron  (ZOFRAN ) 4 MG tablet TAKE ONE TABLET (4 MG TOTAL) BY MOUTH EVERY EIGHT (EIGHT) HOURS AS NEEDED FOR NAUSEA OR VOMITING. 11/01/23   Cleatus Arlyss RAMAN, MD  polyethylene glycol powder (GLYCOLAX /MIRALAX ) 17 GM/SCOOP powder Take 17 g by mouth daily. Dissolve 1 capful (17g) in 4-8 ounces of liquid and take by mouth daily.    [provider]  tamsulosin  (FLOMAX ) 0.4 MG CAPS capsule Take 1 capsule (0.4 mg total) by mouth daily. 08/14/24   Cleatus Arlyss RAMAN, MD  triamcinolone  cream (KENALOG ) 0.1 % Apply 1-2 times daily to aa, lower legs for up to 2 weeks. Avoid applying to face, groin, and axilla. Use as directed. Long-term use can cause thinning of the skin. 06/10/24   Jackquline Sawyer, MD    Physical Exam: Vitals:   09/12/24 1200 09/12/24 1400 09/12/24 1430 09/12/24 1500  BP: 105/82 (!) 123/59 (!) 135/110 124/67  Pulse: 74 74 74 73  Resp:      Temp:      TempSrc:      SpO2: 97% 96% 98% 100%  Weight:      Height:        General: alert and oriented to time, place, and person. Appear in mild distress, affect appropriate Eyes: PERRLA, Conjunctiva normal ENT: Oral Mucosa Clear, moist  Neck: no JVD, no Abnormal Mass Or lumps Cardiovascular: Irregular rhythm, no  Murmur, peripheral pulses  symmetrical Respiratory: increased respiratory effort, Bilateral Air entry equal and Decreased, no signs of accessory muscle use, mild Crackles, and mild wheezes b/l Abdomen: Bowel Sound present, Soft and no tenderness, no hernia Skin: no rashes  Extremities: mild Pedal edema, no calf tenderness Neurologic: without any new focal findings Gait not checked due to patient safety concerns  Data Reviewed: I have personally reviewed and interpreted labs, imaging as discussed below.  CBC: Recent Labs  Lab 09/12/24 1047  WBC 6.4  NEUTROABS 4.7  HGB 14.1  HCT 45.0  MCV 101.1*  PLT 167   Basic Metabolic Panel: Recent Labs  Lab 09/12/24 1047  NA 141  K 5.0  CL 101  CO2 32  GLUCOSE 119*  BUN 16  CREATININE 0.99  CALCIUM  8.4*   GFR: Estimated Creatinine Clearance: 69.4 mL/min (by C-G formula based on SCr of 0.99 mg/dL). Liver Function Tests: Recent Labs  Lab 09/12/24 1047  AST 16  ALT 9  ALKPHOS 42  BILITOT 1.5*  PROT 6.8  ALBUMIN 3.5   No results for input(s): LIPASE, AMYLASE in the last 168 hours. No results for input(s): AMMONIA in the last 168 hours. Coagulation Profile: Recent Labs  Lab 09/12/24 1047  INR 1.4*   Cardiac Enzymes: No results for input(s): CKTOTAL, CKMB, CKMBINDEX, TROPONINI in the last 168 hours. BNP (last 3 results) No results for input(s): PROBNP in the last 8760 hours. HbA1C: No results for input(s): HGBA1C in the last 72 hours. CBG: No results for input(s): GLUCAP in the last 168 hours. Lipid Profile: No results for input(s): CHOL, HDL, LDLCALC, TRIG, CHOLHDL, LDLDIRECT in the last 72 hours. Thyroid  Function Tests: No results for input(s): TSH, T4TOTAL, FREET4, T3FREE, THYROIDAB in the last 72 hours. Anemia Panel: No results for input(s): VITAMINB12, FOLATE, FERRITIN, TIBC, IRON , RETICCTPCT in the last 72 hours. Urine analysis:    Component Value Date/Time   COLORURINE YELLOW (A)  09/12/2024 1153   APPEARANCEUR CLEAR (A) 09/12/2024 1153   APPEARANCEUR Hazy 06/11/2013 1519   LABSPEC 1.016 09/12/2024 1153   LABSPEC 1.015 06/11/2013 1519   PHURINE 5.0 09/12/2024 1153   GLUCOSEU NEGATIVE 09/12/2024 1153   GLUCOSEU NEGATIVE 10/31/2023 1034   HGBUR NEGATIVE 09/12/2024 1153   BILIRUBINUR NEGATIVE 09/12/2024 1153   BILIRUBINUR Negative 07/02/2024 1234   BILIRUBINUR Negative 06/11/2013 1519   KETONESUR NEGATIVE 09/12/2024 1153   PROTEINUR NEGATIVE 09/12/2024 1153   UROBILINOGEN 0.2 07/02/2024 1234   UROBILINOGEN 0.2 10/31/2023 1034   NITRITE NEGATIVE 09/12/2024 1153   LEUKOCYTESUR NEGATIVE 09/12/2024 1153   LEUKOCYTESUR 3+ 06/11/2013 1519    Radiological Exams on Admission: DG Chest Port 1 View Result Date: 09/12/2024 CLINICAL DATA:  Sepsis. EXAM: PORTABLE CHEST 1 VIEW COMPARISON:  07/18/2024 FINDINGS: The cardiac silhouette, mediastinal and contours are within normal limits and stable given the AP projection, portable technique and low lung volumes. Very low lung volumes with vascular crowding and bibasilar atelectasis and scarring. No obvious infiltrates or large effusions. No pneumothorax. The bony thorax is intact. IMPRESSION: Very low lung volumes with vascular crowding and bibasilar atelectasis and scarring. Electronically Signed   By: MYRTIS Stammer M.D.   On: 09/12/2024 11:54   EKG: Independently reviewed.  Accelerated junctional rhythm, consider atypical RBBB   I reviewed all nursing notes, pharmacy notes, vitals, pertinent old records.  Assessment/Plan Principal Problem:   Respiratory failure, acute (HCC)   # Acute hypoxic respiratory failure secondary to COPD exacerbation versus bronchitis Patient has BiPAP at home but  pressure is not set up so he is not using it for past 6 weeks Status post azithromycin and ceftriaxone given in the ED Continue azithromycin 40 mg for total 3 days Mucinex extremity gram p.o. twice daily DuoNeb every 6 hourly scheduled,  transition to as needed after improvement Breo Ellipta inhaler Solu-Medrol  40 m IV every 12 hourly x 3 doses followed by prednisone 40 mg p.o. daily for 3 days Started BiPAP nightly, RT consulted to set up the pressure on the home BiPAP. Check RVP panel Follow CT chest Follow-up blood cultures   # CAD, HTN, A-fib, grade 1 DD Resume amiodarone  20 mg p.o. daily, Lasix  20 mg p.o. daily and Eliquis  5 mg p.o. twice daily  # BPH, continue Flomax   # Constipation, continue MiraLAX  until   Nutrition: Cardiac diet DVT Prophylaxis: Eliquis   Advance goals of care discussion: Full code   Consults: None  Family Communication: family was present at bedside, at the time of interview.  Opportunity was given to ask question and all questions were answered satisfactorily.  Disposition: Admitted as inpatient, medical telemetry unit. Likely to be discharged to home, in 1-2 days when stable.  I have discussed plan of care as described above with RN and patient/family.  Severity of Illness: The appropriate patient status for this patient is INPATIENT. Inpatient status is judged to be reasonable and necessary in order to provide the required intensity of service to ensure the patient's safety. The patient's presenting symptoms, physical exam findings, and initial radiographic and laboratory data in the context of their chronic comorbidities is felt to place them at high risk for further clinical deterioration. Furthermore, it is not anticipated that the patient will be medically stable for discharge from the hospital within 2 midnights of admission.   * I certify that at the point of admission it is my clinical judgment that the patient will require inpatient hospital care spanning beyond 2 midnights from the point of admission due to high intensity of service, high risk for further deterioration and high frequency of surveillance required.*   Author: ELVAN SOR, MD [facility] 09/12/2024 3:26  PM   To reach On-call, see care teams to locate the attending and reach out to them via [email or web address]. If 7PM-7AM, please contact night-coverage If you still have difficulty reaching the attending provider, please page the [facility] (Director on Call) for [facility] on amion for assistance.

## 2024-09-12 NOTE — ED Triage Notes (Addendum)
 Pt BIB [facility] from [facility]. Pt called out due to feeling weak for the past few days. O2 sats at facility was 70% on room air. No O2 @ baseline. Has a ablation scheduled for his afib next week. Denies SOB.  EMS reports pt is non compliant with Lasix . Received shingles vaccine x2 days ago. Axo, nad.

## 2024-09-12 NOTE — Sepsis Progress Note (Signed)
 eLink is following this Code Sepsis.

## 2024-09-13 DIAGNOSIS — J9601 Acute respiratory failure with hypoxia: Secondary | ICD-10-CM | POA: Diagnosis not present

## 2024-09-13 DIAGNOSIS — J9602 Acute respiratory failure with hypercapnia: Secondary | ICD-10-CM | POA: Diagnosis not present

## 2024-09-13 LAB — CBC
HCT: 42.9 % (ref 39.0–52.0)
Hemoglobin: 13.1 g/dL (ref 13.0–17.0)
MCH: 30.9 pg (ref 26.0–34.0)
MCHC: 30.5 g/dL (ref 30.0–36.0)
MCV: 101.2 fL — ABNORMAL HIGH (ref 80.0–100.0)
Platelets: 144 K/uL — ABNORMAL LOW (ref 150–400)
RBC: 4.24 MIL/uL (ref 4.22–5.81)
RDW: 12.8 % (ref 11.5–15.5)
WBC: 6 K/uL (ref 4.0–10.5)
nRBC: 0 % (ref 0.0–0.2)

## 2024-09-13 LAB — BASIC METABOLIC PANEL WITH GFR
Anion gap: 8 (ref 5–15)
BUN: 14 mg/dL (ref 8–23)
CO2: 30 mmol/L (ref 22–32)
Calcium: 8.2 mg/dL — ABNORMAL LOW (ref 8.9–10.3)
Chloride: 103 mmol/L (ref 98–111)
Creatinine, Ser: 0.96 mg/dL (ref 0.61–1.24)
GFR, Estimated: >60 mL/min (ref >60)
Glucose, Bld: 143 mg/dL — ABNORMAL HIGH (ref 70–99)
Potassium: 4.3 mmol/L (ref 3.5–5.1)
Sodium: 141 mmol/L (ref 135–145)

## 2024-09-13 LAB — MAGNESIUM: Magnesium: 2.1 mg/dL (ref 1.7–2.4)

## 2024-09-13 LAB — PHOSPHORUS: Phosphorus: 3.8 mg/dL (ref 2.5–4.6)

## 2024-09-13 MED ORDER — SODIUM CHLORIDE 0.9 % IV SOLN
1.0000 g | INTRAVENOUS | Status: DC
  Administered 2024-09-13: 1 g via INTRAVENOUS
  Filled 2024-09-13: qty 10

## 2024-09-13 MED ORDER — CYANOCOBALAMIN 500 MCG PO TABS
500.0000 ug | ORAL_TABLET | Freq: Every day | ORAL | Status: DC
  Administered 2024-09-13 – 2024-09-14 (×2): 500 ug via ORAL
  Filled 2024-09-13 (×2): qty 1

## 2024-09-13 NOTE — Progress Notes (Signed)
 [facility] Progress Note  Patient: Todd Gonzales    FMW:[id number]  DOA: 09/12/2024     Date of Service: the patient was seen and examined on 09/13/2024  Chief Complaint  Patient presents with   Weakness   Brief hospital course: Todd Gonzales is a 83 y.o. male with PMH of CAD, HTN, A-fib, grade 1 DD, COPD, BPH, on BiPAP at night, as reviewed from EMR, patient presented at [facility] ED on 10/9 with feeling weak, chills, shortness of breath and lightheadedness.  Patient is having the symptoms for past 2 days, today patient was not feeling well so his wife checked his pulse ox and it was 67% so patient was brought in to the ED.  Currently patient is saturating well on 2 L oxygen, still has significant shortness of breath and feels chest congestion.  Denied any chest pain, no new other complaints.     ED Course: VS Tmax 100.7, HR 80, RR 19-26, BP 142/71, 100% on 2 L via nasal cannula CMP, glucose 119, calcium  8.4, rest within normal range CBC no leukocytosis, Hb stable, MCV 101.1 elevated Troponin x 1 negative BNP 120, INR 1.4 Negative COVID, flu and RSV UA negative CXR: Very low lung volumes with vascular crowding and bibasilar atelectasis and scarring. CT chest is pending Blood cultures collected  Assessment and Plan:   # Acute hypoxic and hypercapnic respiratory failure secondary to COPD exacerbation and community-acquired pneumonia Patient has BiPAP at home but pressure is not set up so he is not using it for past 6 weeks Status post azithromycin and ceftriaxone given in the ED Continue azithromycin 40 mg for total 3 days Mucinex extremity gram p.o. twice daily DuoNeb every 6 hourly scheduled, transition to as needed after improvement Breo Ellipta inhaler Solu-Medrol  40 m IV every 12 hourly x 3 doses followed by prednisone 40 mg p.o. daily for 3 days Started BiPAP nightly, RT consulted to set up the pressure on the home BiPAP. ABG reviewed 10/10 patient did not use BiPAP last  night, patient brought his home BiPAP from home, advised to use tonight and let RT to set up the pressures.  # Community-acquired pneumonia CT chest: Small right pleural effusion is noted with adjacent subsegmental atelectasis or pneumonia. Continue azithromycin and ceftriaxone Continue Mucinex and breathing treatments as above RVP panel negative Follow blood cultures.    # CAD, HTN, A-fib, grade 1 DD Resume amiodarone  20 mg p.o. daily, Lasix  20 mg p.o. daily and Eliquis  5 mg p.o. twice daily   # BPH, continue Flomax    # Constipation, continue MiraLAX  and Dulcolax    Body mass index is 36.49 kg/m.  Interventions:   Diet: Heart healthy diet DVT Prophylaxis: Eliquis   Advance goals of care discussion: Full code  Family Communication: family was present at bedside, at the time of interview.  The pt provided permission to discuss medical plan with the family. Opportunity was given to ask question and all questions were answered satisfactorily.   Disposition:  Pt is from home , admitted with CAP and resp failure, still has resp failure, which precludes a safe discharge. Discharge to Home, when stable, most likely in 1 to 2 days.  Subjective: No significant events overnight.  Patient was feeling improvement in the shortness of breath, still has mild chest congestion.  Denies any chest pain or palpitation Patient agreed to wear BiPAP today, he brought his own BiPAP from home. RN was advised to call RT to set up the BiPAP tonight  and adjust the pressure which patient can handle  Physical Exam: General: NAD, lying comfortably Appear in no distress, affect appropriate Eyes: PERRLA ENT: Oral Mucosa Clear, moist  Neck: no JVD,  Cardiovascular: S1 and S2 Present, no Murmur,  Respiratory: good respiratory effort, Bilateral Air entry equal and Decreased, mild Crackles, no significant wheezes Abdomen: Bowel Sound present, Soft and no tenderness,  Skin: no rashes Extremities: mild  Pedal edema, no calf tenderness Neurologic: without any new focal findings Gait not checked due to patient safety concerns  Vitals:   09/12/24 1702 09/12/24 2034 09/12/24 2103 09/13/24 0235  BP:  127/63  123/69  Pulse:  72  70  Resp:  18  18  Temp:  98.4 F (36.9 C)  98.4 F (36.9 C)  TempSrc:  Oral  Oral  SpO2: 98% 91% 94% 94%  Weight:      Height:        Intake/Output Summary (Last 24 hours) at 09/13/2024 1545 Last data filed at 09/13/2024 1300 Gross per 24 hour  Intake 240 ml  Output 400 ml  Net -160 ml   Filed Weights   09/12/24 1037  Weight: 115.3 kg    Data Reviewed: I have personally reviewed and interpreted daily labs, tele strips, imagings as discussed above. I reviewed all nursing notes, pharmacy notes, vitals, pertinent old records I have discussed plan of care as described above with RN and patient/family.  CBC: Recent Labs  Lab 09/12/24 1047 09/13/24 0613  WBC 6.4 6.0  NEUTROABS 4.7  --   HGB 14.1 13.1  HCT 45.0 42.9  MCV 101.1* 101.2*  PLT 167 144*   Basic Metabolic Panel: Recent Labs  Lab 09/12/24 1047 09/13/24 0613  NA 141 141  K 5.0 4.3  CL 101 103  CO2 32 30  GLUCOSE 119* 143*  BUN 16 14  CREATININE 0.99 0.96  CALCIUM  8.4* 8.2*  MG 2.2 2.1  PHOS 4.0 3.8    Studies: No results found.  Scheduled Meds:  amiodarone   200 mg Oral Daily   apixaban   5 mg Oral BID   bisacodyl   10 mg Oral Nightly   vitamin B-12  500 mcg Oral Daily   fluticasone  furoate-vilanterol  1 puff Inhalation Daily   furosemide   20 mg Oral Daily   gabapentin   300 mg Oral QHS   guaiFENesin  600 mg Oral BID   ipratropium-albuterol   3 mL Nebulization TID   methylPREDNISolone  (SOLU-MEDROL ) injection  40 mg Intravenous Q12H   Followed by   NOREEN ON 09/14/2024] predniSONE  40 mg Oral Q breakfast   polyethylene glycol  17 g Oral BID   sodium chloride  flush  3 mL Intravenous Q12H   tamsulosin   0.4 mg Oral Daily   Continuous Infusions:  azithromycin 500 mg  (09/13/24 1232)   PRN Meds: acetaminophen  **OR** acetaminophen , chlorpheniramine-HYDROcodone , hydrALAZINE, ondansetron  **OR** ondansetron  (ZOFRAN ) IV, sodium chloride  flush  Time spent: 35 minutes  Author: ELVAN SOR. MD [facility] 09/13/2024 3:45 PM  To reach On-call, see care teams to locate the attending and reach out to them via [email or web address]. If 7PM-7AM, please contact night-coverage If you still have difficulty reaching the attending provider, please page the [facility] (Director on Call) for [facility] on amion for assistance.

## 2024-09-14 ENCOUNTER — Other Ambulatory Visit: Payer: Self-pay

## 2024-09-14 DIAGNOSIS — J9602 Acute respiratory failure with hypercapnia: Secondary | ICD-10-CM | POA: Diagnosis not present

## 2024-09-14 DIAGNOSIS — J9601 Acute respiratory failure with hypoxia: Secondary | ICD-10-CM | POA: Diagnosis not present

## 2024-09-14 DIAGNOSIS — G4733 Obstructive sleep apnea (adult) (pediatric): Secondary | ICD-10-CM

## 2024-09-14 LAB — CBC
HCT: 42.4 % (ref 39.0–52.0)
Hemoglobin: 13.2 g/dL (ref 13.0–17.0)
MCH: 31.4 pg (ref 26.0–34.0)
MCHC: 31.1 g/dL (ref 30.0–36.0)
MCV: 101 fL — ABNORMAL HIGH (ref 80.0–100.0)
Platelets: 148 K/uL — ABNORMAL LOW (ref 150–400)
RBC: 4.2 MIL/uL — ABNORMAL LOW (ref 4.22–5.81)
RDW: 12.9 % (ref 11.5–15.5)
WBC: 10.1 K/uL (ref 4.0–10.5)
nRBC: 0 % (ref 0.0–0.2)

## 2024-09-14 LAB — BASIC METABOLIC PANEL WITH GFR
Anion gap: 5 (ref 5–15)
BUN: 24 mg/dL — ABNORMAL HIGH (ref 8–23)
CO2: 33 mmol/L — ABNORMAL HIGH (ref 22–32)
Calcium: 8.2 mg/dL — ABNORMAL LOW (ref 8.9–10.3)
Chloride: 102 mmol/L (ref 98–111)
Creatinine, Ser: 0.86 mg/dL (ref 0.61–1.24)
GFR, Estimated: >60 mL/min (ref >60)
Glucose, Bld: 152 mg/dL — ABNORMAL HIGH (ref 70–99)
Potassium: 4 mmol/L (ref 3.5–5.1)
Sodium: 140 mmol/L (ref 135–145)

## 2024-09-14 LAB — MAGNESIUM: Magnesium: 2.3 mg/dL (ref 1.7–2.4)

## 2024-09-14 LAB — PHOSPHORUS: Phosphorus: 2.9 mg/dL (ref 2.5–4.6)

## 2024-09-14 MED ORDER — SODIUM CHLORIDE 0.9 % IV SOLN
2.0000 g | INTRAVENOUS | Status: AC
  Administered 2024-09-14: 2 g via INTRAVENOUS
  Filled 2024-09-14: qty 20

## 2024-09-14 MED ORDER — ALBUTEROL SULFATE (2.5 MG/3ML) 0.083% IN NEBU: 2.5000 mg | INHALATION_SOLUTION | Freq: Four times a day (QID) | RESPIRATORY_TRACT | Status: DC | PRN

## 2024-09-14 MED ORDER — AEROCHAMBER PLUS FLO-VU MEDIUM MISC
1.0000 | Freq: Once | Status: DC
  Filled 2024-09-14: qty 1

## 2024-09-14 MED ORDER — PREDNISONE 20 MG PO TABS: 40.0000 mg | ORAL_TABLET | Freq: Every day | ORAL | 0 refills | Status: DC

## 2024-09-14 MED ORDER — PREDNISONE 20 MG PO TABS
40.0000 mg | ORAL_TABLET | Freq: Every day | ORAL | 0 refills | Status: DC
  Filled 2024-09-14: qty 6, 3d supply, fill #0

## 2024-09-14 MED ORDER — PANTOPRAZOLE SODIUM 40 MG PO TBEC
40.0000 mg | DELAYED_RELEASE_TABLET | Freq: Every day | ORAL | 0 refills | Status: DC
  Filled 2024-09-14: qty 7, 7d supply, fill #0

## 2024-09-14 MED ORDER — AEROCHAMBER PLUS FLO-VU MEDIUM MISC: 1.0000 | Freq: Once | 0 refills | Status: AC

## 2024-09-14 MED ORDER — GUAIFENESIN ER 600 MG PO TB12
600.0000 mg | ORAL_TABLET | Freq: Two times a day (BID) | ORAL | 0 refills | Status: AC
  Filled 2024-09-14: qty 10, 5d supply, fill #0

## 2024-09-14 MED ORDER — PANTOPRAZOLE SODIUM 40 MG PO TBEC: 40.0000 mg | DELAYED_RELEASE_TABLET | Freq: Every day | ORAL | 0 refills | Status: DC

## 2024-09-14 MED ORDER — BUDESON-GLYCOPYRROL-FORMOTEROL 160-9-4.8 MCG/ACT IN AERO: 2.0000 | INHALATION_SPRAY | Freq: Two times a day (BID) | RESPIRATORY_TRACT | 1 refills | Status: DC

## 2024-09-14 MED ORDER — IPRATROPIUM-ALBUTEROL 0.5-2.5 (3) MG/3ML IN SOLN: 3.0000 mL | RESPIRATORY_TRACT | Status: DC | PRN

## 2024-09-14 MED ORDER — GUAIFENESIN ER 600 MG PO TB12: 600.0000 mg | ORAL_TABLET | Freq: Two times a day (BID) | ORAL | 0 refills | Status: DC

## 2024-09-14 MED ORDER — BUDESON-GLYCOPYRROL-FORMOTEROL 160-9-4.8 MCG/ACT IN AERO
2.0000 | INHALATION_SPRAY | Freq: Two times a day (BID) | RESPIRATORY_TRACT | Status: DC
  Filled 2024-09-14: qty 5.9

## 2024-09-14 MED ORDER — AZITHROMYCIN 500 MG PO TABS
500.0000 mg | ORAL_TABLET | Freq: Once | ORAL | Status: AC
  Administered 2024-09-14: 500 mg via ORAL
  Filled 2024-09-14: qty 1

## 2024-09-14 NOTE — Discharge Instructions (Addendum)
 Home Health active with [facility]. New orders and referral placed. You will receive a call 24 to 48 hrs after discharge.   A rolling walker has been ordered. [employer] will deliver to your home. You will receive a phone call to arrange for delivery.

## 2024-09-14 NOTE — TOC Initial Note (Addendum)
 Transition of Care [facility]) - Initial/Assessment Note    Patient Details  Name: Todd Gonzales MRN: [id number] Date of Birth: [birth year]  Transition of Care [facility]) CM/SW Contact:    Todd L Amiliana Foutz, LCSW Phone Number: 09/14/2024, 9:03 AM  Clinical Narrative:                    Chart review. Per Bamboo, patient is already active with [facility] for HHA. TOC will advise [facility] of new orders.   TOC will continue to follow patient for updates and discharge planning.    10:15am: CSW met with patient. Patient was sitting up in the recliner. CSW role and HIPAA discussed. Son, Todd Gonzales, was at bedside.   Recommendations for Home Health discussed. Patient agreeable to continuing work with [facility]. Updated orders will be sent to [facility]. Patient requested a RW. Order will be placed with [facility] after attending enters supply order.   CSW spoke with son, Todd Gonzales. Todd Gonzales advised that he has a minivan that he will be using to transport his father home.   1:41pm: CSW contacted [facility] for on-call assistance. CSW received a return call from Todd Gonzales. CSW provided new recommendations for Bi-pap. Todd Gonzales advised that she will attempt to make changes in [facility]. She will contact CSW back if that was not successful. The earliest she can get someone out is Monday to manually make adjustments.   1:53pm: Settings on Bipap were updated on [facility].   No further TOC needs at this time.      Patient Goals and CMS Choice            Expected Discharge Plan and Services                                              Prior Living Arrangements/Services                       Activities of Daily Living   ADL Screening (condition at time of admission) Independently performs ADLs?: Yes (appropriate for developmental age) Is the patient deaf or have difficulty hearing?: No Does the patient have difficulty seeing, even when wearing glasses/contacts?: No Does the patient have difficulty  concentrating, remembering, or making decisions?: No  Permission Sought/Granted                  Emotional Assessment              Admission diagnosis:  Respiratory failure, acute (HCC) [J96.00] Acute respiratory failure with hypoxia (HCC) [J96.01] Patient Active Problem List   Diagnosis Date Noted   Respiratory failure, acute (HCC) 09/12/2024   On amiodarone  therapy 08/29/2024   OSA (obstructive sleep apnea) 08/29/2024   Obesity (BMI 30-39.9) 07/19/2024   Hypokalemia 07/19/2024   Dysphagia 07/03/2024   Pressure injury of left buttock, unstageable (HCC) 01/01/2024   Acute respiratory failure with hypoxia and hypercapnia (HCC) 12/20/2023   Chronic heart failure with preserved ejection fraction (HFpEF) (HCC) 12/20/2023   Acute on chronic respiratory failure with hypoxia and hypercapnia (HCC) 12/20/2023   Hypoxia 12/20/2023   Respiratory acidosis 12/20/2023   Lower urinary tract symptoms (LUTS) 11/01/2023   Right leg pain 07/13/2023   Paroxysmal atrial fibrillation/flutter (HCC) 04/15/2023   PVC (premature ventricular contraction) 04/14/2023   Atrial flutter (HCC) 04/14/2023   Irregular heartbeat 04/14/2023   Atrial fibrillation with rapid  ventricular response (HCC) 04/14/2023   Coronary artery disease, non-occlusive 03/26/2023   B12 deficiency 12/28/2022   SOB (shortness of breath) 12/26/2022   Bilateral lower extremity edema 12/26/2022   Knee pain 08/29/2022   History of prostatitis 08/19/2022   Constipation 05/08/2022   Dysuria 04/13/2022   Urinary frequency 04/05/2022   Abnormal urinalysis 04/05/2022   Balance problem 02/16/2022   Thumb pain 03/31/2020   Muscle cramp 03/31/2020   Cervical radiculopathy 12/13/2019   Right arm pain 10/10/2019   Vitamin D  deficiency 10/10/2019   Incontinence of feces    Post-op pain    Hyperglycemia    Status post laminectomy    Postoperative pain after spinal surgery 11/30/2018   Myelopathy 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 [place] [address] Phone: [phone] Fax: [phone]  [facility] [address], [place] - [address] [place] [address] Phone: [phone] Fax: [phone]  [facility] - [place] , [place] - [address] [place]  [place] [address] Phone: [phone] Fax: [phone]     Social Drivers of Health (SDOH) Social History: SDOH Screenings   Food Insecurity: No Food Insecurity (09/12/2024)  Housing: Low Risk  (09/12/2024)  Transportation Needs: No Transportation Needs (09/12/2024)  Utilities: Not At Risk (09/12/2024)  Alcohol Screen: Low Risk  (08/06/2024)  Depression (PHQ2-9): Low Risk  (08/06/2024)  Financial Resource Strain: Low Risk  (08/06/2024)  Physical Activity: Insufficiently Active (08/06/2024)  Social Connections: Moderately Isolated (09/12/2024)  Stress: No Stress Concern Present (08/06/2024)  Tobacco Use: Low Risk  (08/29/2024)  Health Literacy: Adequate Health Literacy (08/06/2024)   SDOH Interventions:     Readmission Risk Interventions    04/16/2023   11:32 AM  Readmission Risk Prevention Plan  Post Dischage Appt Complete  Medication Screening Complete  Transportation Screening Complete

## 2024-09-14 NOTE — Consult Note (Signed)
 NAME:  Todd Gonzales, MRN:  [id number], DOB:  08-23-1941, LOS: 2 ADMISSION DATE:  09/12/2024, CONSULTATION DATE:  09/14/2024 REFERRING MD:  Elvan Von COME  CHIEF COMPLAINT:  Adjustment of BiPAP settings/severe OSA   History of Present Illness:  Todd Gonzales is an 83 year old lifelong never smoker with a history of severe obstructive sleep apnea (AHI of 80) and other medical history as noted below, who presented to [facility] on 12 September 2024 with shortness of breath and hypoxemia.  He is being treated for bronchitis versus pneumonia.  A CT chest performed 12 September 2024 is consistent with potential community-acquired pneumonia.  The patient has been recently diagnosed with very severe obstructive sleep apnea as noted above.  AHI was noted to be 80.  He underwent a titration study done on 28 August showing that he required BiPAP for control of his apnea and his settings where IPAP of 17 and EPAP of 13 during the titration study.  However the recommendation by sleep medicine was to place the patient on auto BiPAP with a minimum EPAP of 4 and maximum IPAP of 25 and pressure support of 5 cm H2O.  The patient has found this to not be optimal as he will get occasionally blast of pressure that wakes him up from sleep.  He has been unable to wear the device well.  During his admission here he has been on our BiPAP which is set closer to his titration study settings and he has found it more comfortable.  We are asked to optimize settings for the patient.  Currently the patient feels better after treatment with antibiotics and bronchodilators.  He apparently had significant bronchospasm on admission.  Suspect he also has underlying asthma (he is a lifelong never smoker).   I have reviewed the patient's chart in detail as well as current available imaging and labs.  Pertinent  Medical History  Severe obstructive sleep apnea, AHI 80 on home BiPAP Obesity with obesity hypoventilation Probable asthma Persistent atrial  fibrillation Squamous cell carcinoma of the skin   Significant Hospital Events: Including procedures, antibiotic start and stop dates in addition to other pertinent events   09/12/2024 admitted to [facility] with acute respiratory failure with hypoxia and hypercapnia 09/14/2024 PCCM consulted  Interim History / Subjective:  Feels markedly better than on admission.  Able to tolerate hospital BiPAP.  Feels close to baseline.  Objective    Blood pressure 114/62, pulse 71, temperature 97.9 F (36.6 C), temperature source Oral, resp. rate 18, height 5' 10 (1.778 m), weight 115.3 kg, SpO2 94%.    SpO2: 94 % O2 Flow Rate (L/min): 2 L/min FiO2 (%):  (4L)     Intake/Output Summary (Last 24 hours) at 09/14/2024 1221 Last data filed at 09/14/2024 0900 Gross per 24 hour  Intake 730 ml  Output --  Net 730 ml   Filed Weights   09/12/24 1037  Weight: 115.3 kg    Examination: GENERAL: Obese gentleman, no acute distress, sitting up in chair.  Awake and alert. HEAD: Normocephalic, atraumatic.  EYES: Pupils equal, round, reactive to light.  No scleral icterus.  MOUTH: Partial upper and lower dentures.  Oral mucosa moist.  No thrush.  Crowded airway. NECK: Supple. No thyromegaly. Trachea midline. No JVD.  No adenopathy. PULMONARY: Good air entry bilaterally.  No adventitious sounds. CARDIOVASCULAR: S1 and S2.  Irregularly irregular rhythm rate controlled.  No rubs, murmurs or gallops heard. ABDOMEN: Significant truncal obesity.   MUSCULOSKELETAL: No joint deformity, no clubbing, no  edema.  NEUROLOGIC: No overt focal deficit, no gait disturbance, speech is fluent. SKIN: Intact,warm,dry. PSYCH: Mood and behavior normal.  Imaging reviewed   Chest x-ray obtained 12 September 2024 showing low lung volumes with vascular crowding and bibasilar atelectasis or scarring.    CT chest obtained 12 September 2024 showing atelectasis/infiltrate right lower lobe with small pleural effusion    Assessment  and Plan  Acute respiratory failure with hypoxia and hypercapnia Underlying obesity with obesity hypoventilation Underlying severe obstructive sleep apnea AHI of 80 Triggered by acute illness bronchitis/pneumonia Inability to tolerate home BiPAP Tolerating in-house BiPAP well Recommend: Auto BiPAP settings of IPAP max 18 cm H2O and EPAP minimum 10 cm H2O on spontaneous mode Patient needs to wear BiPAP anytime during sleep/naps  Bronchospasm on admission Bronchitis versus pneumonia Continue antibiotics, may switch to p.o. Pulmonary hygiene with flutter valve as needed Patient finds Breo ineffective Switch to Breztri 2 puffs twice a day with spacer Albuterol  as needed  Labs   CBC: Recent Labs  Lab 09/12/24 1047 09/13/24 0613 09/14/24 0336  WBC 6.4 6.0 10.1  NEUTROABS 4.7  --   --   HGB 14.1 13.1 13.2  HCT 45.0 42.9 42.4  MCV 101.1* 101.2* 101.0*  PLT 167 144* 148*    Basic Metabolic Panel: Recent Labs  Lab 09/12/24 1047 09/13/24 0613 09/14/24 0336  NA 141 141 140  K 5.0 4.3 4.0  CL 101 103 102  CO2 32 30 33*  GLUCOSE 119* 143* 152*  BUN 16 14 24*  CREATININE 0.99 0.96 0.86  CALCIUM  8.4* 8.2* 8.2*  MG 2.2 2.1 2.3  PHOS 4.0 3.8 2.9   GFR: Estimated Creatinine Clearance: 79.9 mL/min (by C-G formula based on SCr of 0.86 mg/dL). Recent Labs  Lab 09/12/24 1047 09/12/24 1048 09/13/24 0613 09/14/24 0336  WBC 6.4  --  6.0 10.1  LATICACIDVEN  --  0.7  --   --     Liver Function Tests: Recent Labs  Lab 09/12/24 1047  AST 16  ALT 9  ALKPHOS 42  BILITOT 1.5*  PROT 6.8  ALBUMIN 3.5   No results for input(s): LIPASE, AMYLASE in the last 168 hours. No results for input(s): AMMONIA in the last 168 hours.  ABG    Component Value Date/Time   PHART 7.35 09/12/2024 1522   PCO2ART 63 (H) 09/12/2024 1522   PO2ART 75 (L) 09/12/2024 1522   HCO3 34.8 (H) 09/12/2024 1522   TCO2 28 04/14/2023 1442   O2SAT 92.9 09/12/2024 1522     Coagulation  Profile: Recent Labs  Lab 09/12/24 1047  INR 1.4*    Cardiac Enzymes: No results for input(s): CKTOTAL, CKMB, CKMBINDEX, TROPONINI in the last 168 hours.  HbA1C: Hemoglobin A1C  Date/Time Value Ref Range Status  12/26/2022 04:19 PM 5.3 4.0 - 5.6 % Final  03/26/2020 02:51 PM 5.2 4.0 - 5.6 % Final    CBG: No results for input(s): GLUCAP in the last 168 hours.  Review of Systems:   A 10 point review of systems was performed and it is as noted above otherwise negative.  Past Medical History:  He,  has a past medical history of Actinic keratosis (03/28/2023), Acute hypoxemic respiratory failure (HCC), Aortic atherosclerosis, Arthritis, Basal cell carcinoma (03/28/2023), BPH (benign prostatic hyperplasia), CAD (coronary artery disease), Chronic heart failure with preserved ejection fraction (HFpEF) (HCC), COPD (chronic obstructive pulmonary disease) (HCC), Grade I diastolic dysfunction, H/O hiatal hernia, Hemorrhoids, History of cardioversion, Hyperlipidemia, Hypertension, IGT (impaired glucose tolerance), Incomplete  right bundle branch block (RBBB), Mitral regurgitation, Obesity (BMI 30-39.9), Obstructive sleep apnea, OSA on CPAP, OSA treated with BiPAP, Paroxysmal atrial fibrillation (HCC) (04/14/2023), Paroxysmal atrial fibrillation (HCC), SCC (squamous cell carcinoma) (02/10/2005), SCC (squamous cell carcinoma) (02/12/2024), SCCA (squamous cell carcinoma) of skin (02/10/2005), SCCA (squamous cell carcinoma) of skin (02/13/2006), SCCA (squamous cell carcinoma) of skin (04/05/2006), SCCA (squamous cell carcinoma) of skin (07/06/2010), SCCA (squamous cell carcinoma) of skin (04/27/2009), SCCA (squamous cell carcinoma) of skin (08/28/2014), SCCA (squamous cell carcinoma) of skin (03/21/2016), SCCA (squamous cell carcinoma) of skin (01/19/2017), SCCA (squamous cell carcinoma) of skin (03/04/1840), SCCA (squamous cell carcinoma) of skin (12/23/2020), SCCA (squamous cell carcinoma) of skin  (12/23/2020), Seasonal allergies, Spinal stenosis, Squamous cell carcinoma of skin (08/31/2020), Squamous cell carcinoma of skin (04/27/2021), Squamous cell carcinoma of skin (03/28/2023), Squamous cell carcinoma of skin (08/16/2023), Squamous cell carcinoma of skin (05/28/2024), Squamous cell carcinoma of skin of face (04/27/2021), and UTI (lower urinary tract infection).   Surgical History:   Past Surgical History:  Procedure Laterality Date   BACK SURGERY  2008   Laminectomy L4-5   CARDIOVERSION N/A 12/22/2023   Procedure: CARDIOVERSION;  Surgeon: Darliss Rogue, MD;  Location: [facility] ORS;  Service: Cardiovascular;  Laterality: N/A;   CARDIOVERSION N/A 08/20/2024   Procedure: CARDIOVERSION;  Surgeon: Argentina Clap, MD;  Location: [facility] ORS;  Service: Cardiovascular;  Laterality: N/A;   JOINT REPLACEMENT Bilateral    bil   KNEE ARTHROSCOPY     right   LUMBAR LAMINECTOMY/DECOMPRESSION MICRODISCECTOMY Bilateral 11/20/2018   Procedure: Bilateral Thoracic Twelve to Lumbar One Laminectomy;  Surgeon: Colon Shove, MD;  Location: [facility] OR;  Service: Neurosurgery;  Laterality: Bilateral;  posterior   TEE WITHOUT CARDIOVERSION N/A 12/22/2023   Procedure: TRANSESOPHAGEAL ECHOCARDIOGRAM (TEE);  Surgeon: Darliss Rogue, MD;  Location: [facility] ORS;  Service: Cardiovascular;  Laterality: N/A;   TEE WITHOUT CARDIOVERSION N/A 08/20/2024   Procedure: ECHOCARDIOGRAM, TRANSESOPHAGEAL;  Surgeon: Argentina Clap, MD;  Location: [facility] ORS;  Service: Cardiovascular;  Laterality: N/A;   TOTAL HIP ARTHROPLASTY  03/20/2012   Procedure: TOTAL HIP ARTHROPLASTY ANTERIOR APPROACH;  Surgeon: Donnice JONETTA Car, MD;  Location: [facility] ORS;  Service: Orthopedics;  Laterality: Left;     Social History:   reports that he has never smoked. He has never used smokeless tobacco. He reports current alcohol use of about 2.0 standard drinks of alcohol per week. He reports that he does not use drugs.   Family History:  His family history  includes Arthritis in his mother; Asthma in his father; Lung cancer in his father. There is no history of Colon cancer or Prostate cancer.   Allergies Allergies  Allergen Reactions   Codeine Other (See Comments)    Unknown reaction   Lipitor [Atorvastatin ] Other (See Comments)    Arthralgias    Bactrim [Sulfamethoxazole-Trimethoprim] Rash     Home Medications  Prior to Admission medications   Medication Sig Start Date End Date Taking? Authorizing Provider  acetaminophen  (TYLENOL ) 650 MG CR tablet Take 1,300 mg by mouth 2 (two) times daily as needed for pain.   Yes [provider]  amiodarone  (PACERONE ) 200 MG tablet TAKE 200 MG TWICE DAILY FOR ONE WEEK, THEN TAKE 200 MG ONCE DAILY. Patient taking differently: Take 200 mg by mouth daily. 08/26/24  Yes Vivienne Lonni Ingle, NP  cyanocobalamin  (VITAMIN B12) 1000 MCG/ML injection Inject 1 mL (1,000 mcg total) into the muscle every 30 (thirty) days. 01/26/23  Yes Cleatus Arlyss RAMAN, MD  diclofenac  Sodium (VOLTAREN ) 1 %  GEL Apply 1 Application topically every 6 (six) hours as needed (pain). 09/05/16  Yes [provider]  ELIQUIS  5 MG TABS tablet TAKE ONE TABLET BY MOUTH TWICE A DAY 07/30/24  Yes Cleatus Arlyss RAMAN, MD  fluorouracil  (EFUDEX ) 5 % cream Apply topically 2 (two) times daily. Bid 5-7 days to temples, cheeks, forehead and nose 02/12/24  Yes Jackquline Sawyer, MD  furosemide  (LASIX ) 20 MG tablet Take 1 tablet (20 mg total) by mouth daily. Take 1 to 2 tablets daily as directed for weight gain. 08/29/24 11/27/24 Yes Anner Alm ORN, MD  gabapentin  (NEURONTIN ) 300 MG capsule TAKE ONE CAPSULE BY MOUTH ONCE DAILY 05/02/24  Yes Cleatus Arlyss RAMAN, MD  hydrocortisone  (ANUSOL -HC) 2.5 % rectal cream PLACE ONE APPLICATION RECTALLY TWO TIMES DAILY 08/30/24  Yes Cleatus Arlyss RAMAN, MD  ondansetron  (ZOFRAN ) 4 MG tablet TAKE ONE TABLET (4 MG TOTAL) BY MOUTH EVERY EIGHT (EIGHT) HOURS AS NEEDED FOR NAUSEA OR VOMITING. 11/01/23  Yes Cleatus Arlyss RAMAN,  MD  polyethylene glycol powder (GLYCOLAX /MIRALAX ) 17 GM/SCOOP powder Take 17 g by mouth daily. Dissolve 1 capful (17g) in 4-8 ounces of liquid and take by mouth daily.   Yes [provider]  tamsulosin  (FLOMAX ) 0.4 MG CAPS capsule Take 1 capsule (0.4 mg total) by mouth daily. 08/14/24  Yes Cleatus Arlyss RAMAN, MD  triamcinolone  cream (KENALOG ) 0.1 % Apply 1-2 times daily to aa, lower legs for up to 2 weeks. Avoid applying to face, groin, and axilla. Use as directed. Long-term use can cause thinning of the skin. 06/10/24  Yes Jackquline Sawyer, MD  metoprolol  tartrate (LOPRESSOR ) 25 MG tablet Take 2 times daily when not taking Amiodarone  Patient not taking: Reported on 09/12/2024 08/29/24   Anner Alm ORN, MD   Scheduled Meds:  amiodarone   200 mg Oral Daily   apixaban   5 mg Oral BID   bisacodyl   10 mg Oral Nightly   vitamin B-12  500 mcg Oral Daily   fluticasone  furoate-vilanterol  1 puff Inhalation Daily   furosemide   20 mg Oral Daily   gabapentin   300 mg Oral QHS   guaiFENesin  600 mg Oral BID   polyethylene glycol  17 g Oral BID   predniSONE  40 mg Oral Q breakfast   sodium chloride  flush  3 mL Intravenous Q12H   tamsulosin   0.4 mg Oral Daily   Continuous Infusions: PRN Meds:.acetaminophen  **OR** acetaminophen , chlorpheniramine-HYDROcodone , hydrALAZINE, ipratropium-albuterol , ondansetron  **OR** ondansetron  (ZOFRAN ) IV, sodium chloride  flush   Level 4 consult    Discussed with Dr. Von in detail.    I spent 70 minutes of dedicated to the care of this patient on the date of this encounter to include pre-visit review of records, face-to-face time with the patient discussing conditions above, post visit ordering of testing, clinical documentation with the electronic health record, making appropriate referrals as documented, and communicating necessary findings to members of the patients care team.    C. Leita Sanders, MD Advanced Bronchoscopy PCCM [place]    *This note was generated using voice recognition software/Dragon and/or AI transcription program.  Despite best efforts to proofread, errors can occur which can change the meaning. Any transcriptional errors that result from this process are unintentional and may not be fully corrected at the time of dictation.

## 2024-09-14 NOTE — Discharge Summary (Signed)
 [facility] Discharge Summary   Patient: Todd Gonzales FMW:[id number]  PCP: Cleatus Arlyss RAMAN, MD  Date of admission: 09/12/2024   Date of discharge:  09/14/2024     Discharge Diagnoses:  Principal Problem:   Respiratory failure, acute [facility])   Admitted From: Home Disposition:  Home   Recommendations for Outpatient Follow-up:  F/u with PCP in 1 wk F/u with Pulmo in 1 wk Follow up LABS/TEST:     Follow-up Information     Cleatus Arlyss RAMAN, MD Follow up.   Specialty: Family Medicine Why: hospital follow up Contact information: [address] [place] [address] [phone]                Diet recommendation: Cardiac diet  Activity: The patient is advised to gradually reintroduce usual activities, as tolerated  Discharge Condition: stable  Code Status: Full code   History of present illness: As per the H and P dictated on admission.  Hospital Course:  Todd Gonzales is a 83 y.o. male with PMH of CAD, HTN, A-fib, grade 1 DD, COPD, BPH, on BiPAP at night, as reviewed from EMR, patient presented at [facility] ED on 10/9 with feeling weak, chills, shortness of breath and lightheadedness.  Patient is having the symptoms for past 2 days, today patient was not feeling well so his wife checked his pulse ox and it was 67% so patient was brought in to the ED.  Currently patient is saturating well on 2 L oxygen, still has significant shortness of breath and feels chest congestion.  Denied any chest pain, no new other complaints.     ED Course: VS Tmax 100.7, HR 80, RR 19-26, BP 142/71, 100% on 2 L via nasal cannula CMP, glucose 119, calcium  8.4, rest within normal range CBC no leukocytosis, Hb stable, MCV 101.1 elevated Troponin x 1 negative BNP 120, INR 1.4 Negative COVID, flu and RSV UA negative CXR: Very low lung volumes with vascular crowding and bibasilar atelectasis and scarring. CT chest is pending Blood cultures collected   Assessment and Plan:    #  Acute hypoxic and hypercapnic respiratory failure secondary to COPD exacerbation and community-acquired pneumonia Patient has BiPAP at home but pressure is not set up so he is not using it for past 6 weeks. S/p Azithromycin and ceftriaxone  x 3 doses. Mucinex 600 mg p.o. twice daily. S/p DuoNeb every 6 hourly scheduled, transition to as needed after improvement. S/p Breo Ellipta inhaler S/p Solu-Medrol  40 m IV every 12 hourly x 3 doses followed by prednisone 40 mg p.o. daily for 3 days.  Started BiPAP nightly. ABG reviewed 10/11 patient did use hospital BiPAP last night which she tolerated well.  Pulmonary was consulted, Recommend: Auto BiPAP settings of IPAP max 18 cm H2O and EPAP minimum 10 cm H2O on spontaneous mode  TOC contacted BiPAP company who did set up the pressure settings as above.  Patient was discharged home and advised to follow-up with pulmonary as an outpatient.  Can continue to use BiPAP at home. Prescribed Breztri with spacer as per pulm and steroids for 2 days.  # Community-acquired pneumonia CT chest: Small right pleural effusion is noted with adjacent subsegmental atelectasis or pneumonia. S/p azithromycin and ceftriaxone x 3 days given Continue Mucinex and breathing treatments as above RVP panel negative. Blood cultures NGTD, no leukocytosis. No more need of antibiotics.  Follow with PCP and pulmonary to repeat chest x-ray after 4 weeks for resolution of pneumonia.     #  CAD, HTN, A-fib, grade 1 DD Resume amiodarone  20 mg p.o. daily, Lasix  20 mg p.o. daily and Eliquis  5 mg p.o. twice daily   # BPH, continue Flomax    # Constipation, continue MiraLAX  and Dulcolax  # Obesity class II Body mass index is 36.49 kg/m.  Nutrition Interventions: Calorie restricted diet and daily exercise advised to lose body weight.  Lifestyle modification discussed.  Patient was ambulatory without any assistance. On the day of the discharge the patient's vitals were stable, and no other  acute medical condition were reported by patient. the patient was felt safe to be discharge at [facility].  Consultants: Pulmonary Procedures: None  Discharge Exam: General: Appear in no distress, Oral Mucosa Clear, moist. Cardiovascular: S1 and S2 Present, no Murmur, Respiratory: normal respiratory effort, Bilateral Air entry present and no Crackles, no wheezes Abdomen: Bowel Sound present, Soft and no tenderness. Extremities: no Pedal edema, no calf tenderness Neurology: alert and oriented to time, place, and person affect appropriate.  Filed Weights   09/12/24 1037  Weight: 115.3 kg   Vitals:   09/14/24 0940 09/14/24 1125  BP: 114/62   Pulse: 71   Resp: 18   Temp: 97.9 F (36.6 C)   SpO2: 96% 94%    DISCHARGE MEDICATION: Allergies as of 09/14/2024       Reactions   Codeine Other (See Comments)   Unknown reaction   Lipitor [atorvastatin ] Other (See Comments)   Arthralgias    Bactrim [sulfamethoxazole-trimethoprim] Rash        Medication List     STOP taking these medications    metoprolol  tartrate 25 MG tablet Commonly known as: LOPRESSOR        TAKE these medications    acetaminophen  650 MG CR tablet Commonly known as: TYLENOL  Take 1,300 mg by mouth 2 (two) times daily as needed for pain.   AeroChamber Plus Flo-Vu Medium Misc 1 each by Other route once for 1 dose.   amiodarone  200 MG tablet Commonly known as: PACERONE  TAKE 200 MG TWICE DAILY FOR ONE WEEK, THEN TAKE 200 MG ONCE DAILY. What changed: See the new instructions.   budesonide-glycopyrrolate -formoterol 160-9-4.8 MCG/ACT Aero inhaler Commonly known as: BREZTRI Inhale 2 puffs into the lungs 2 (two) times daily.   cyanocobalamin  1000 MCG/ML injection Commonly known as: VITAMIN B12 Inject 1 mL (1,000 mcg total) into the muscle every 30 (thirty) days.   diclofenac  Sodium 1 % Gel Commonly known as: VOLTAREN  Apply 1 Application topically every 6 (six) hours as needed (pain).   Eliquis  5  MG Tabs tablet Generic drug: apixaban  TAKE ONE TABLET BY MOUTH TWICE A DAY   fluorouracil  5 % cream Commonly known as: EFUDEX  Apply topically 2 (two) times daily. Bid 5-7 days to temples, cheeks, forehead and nose   furosemide  20 MG tablet Commonly known as: LASIX  Take 1 tablet (20 mg total) by mouth daily. Take 1 to 2 tablets daily as directed for weight gain.   gabapentin  300 MG capsule Commonly known as: NEURONTIN  TAKE ONE CAPSULE BY MOUTH ONCE DAILY   guaiFENesin 600 MG 12 hr tablet Commonly known as: MUCINEX Take 1 tablet (600 mg total) by mouth 2 (two) times daily for 5 days.   hydrocortisone  2.5 % rectal cream Commonly known as: ANUSOL -HC PLACE ONE APPLICATION RECTALLY TWO TIMES DAILY   ondansetron  4 MG tablet Commonly known as: ZOFRAN  TAKE ONE TABLET (4 MG TOTAL) BY MOUTH EVERY EIGHT (EIGHT) HOURS AS NEEDED FOR NAUSEA OR VOMITING.   pantoprazole 40 MG tablet  Commonly known as: Protonix Take 1 tablet (40 mg total) by mouth daily for 7 days.   polyethylene glycol powder 17 GM/SCOOP powder Commonly known as: GLYCOLAX /MIRALAX  Take 17 g by mouth daily. Dissolve 1 capful (17g) in 4-8 ounces of liquid and take by mouth daily.   predniSONE 20 MG tablet Commonly known as: DELTASONE Take 2 tablets (40 mg total) by mouth daily with breakfast for 3 days. Start taking on: September 15, 2024   tamsulosin  0.4 MG Caps capsule Commonly known as: FLOMAX  Take 1 capsule (0.4 mg total) by mouth daily.   triamcinolone  cream 0.1 % Commonly known as: KENALOG  Apply 1-2 times daily to aa, lower legs for up to 2 weeks. Avoid applying to face, groin, and axilla. Use as directed. Long-term use can cause thinning of the skin.               Durable Medical Equipment  (From admission, onward)           Start     Ordered   09/14/24 1112  For home use only DME Walker rolling  Once       Question Answer Comment  Walker: With 5 Inch Wheels   Patient needs a walker to treat with  the following condition Age-related physical debility      09/14/24 1111           Allergies  Allergen Reactions   Codeine Other (See Comments)    Unknown reaction   Lipitor [Atorvastatin ] Other (See Comments)    Arthralgias    Bactrim [Sulfamethoxazole-Trimethoprim] Rash   Discharge Instructions     Call MD for:  difficulty breathing, headache or visual disturbances   Complete by: As directed    Call MD for:  extreme fatigue   Complete by: As directed    Call MD for:  persistant dizziness or light-headedness   Complete by: As directed    Call MD for:  persistant nausea and vomiting   Complete by: As directed    Call MD for:  severe uncontrolled pain   Complete by: As directed    Call MD for:  temperature >100.4   Complete by: As directed    Diet - low sodium heart healthy   Complete by: As directed    Discharge instructions   Complete by: As directed    F/u with PCP in 1 wk F/u with Pulmo in 1 wk   Increase activity slowly   Complete by: As directed        The results of significant diagnostics from this hospitalization (including imaging, microbiology, ancillary and laboratory) are listed below for reference.    Significant Diagnostic Studies: CT Chest Wo Contrast Result Date: 09/12/2024 CLINICAL DATA:  Shortness of breath. EXAM: CT CHEST WITHOUT CONTRAST TECHNIQUE: Multidetector CT imaging of the chest was performed following the standard protocol without IV contrast. RADIATION DOSE REDUCTION: This exam was performed according to the departmental dose-optimization program which includes automated exposure control, adjustment of the mA and/or kV according to patient size and/or use of iterative reconstruction technique. COMPARISON:  Radiograph of same day. FINDINGS: Cardiovascular: Atherosclerosis of thoracic aorta is noted without aneurysm formation. Mild cardiomegaly. No pericardial effusion. Coronary artery calcifications are noted. Mediastinum/Nodes: No enlarged  mediastinal or axillary lymph nodes. Thyroid  gland, trachea, and esophagus demonstrate no significant findings. Lungs/Pleura: Small right pleural effusion is noted with adjacent subsegmental atelectasis or pneumonia. No definite pneumothorax. Minimal left basilar subsegmental atelectasis is noted. Upper Abdomen: No acute abnormality. Musculoskeletal:  No chest wall mass or suspicious bone lesions identified. IMPRESSION: 1. Small right pleural effusion is noted with adjacent subsegmental atelectasis or pneumonia. 2. Coronary artery calcifications are noted suggesting coronary artery disease. 3. Aortic atherosclerosis. Aortic Atherosclerosis (ICD10-I70.0). Electronically Signed   By: Lynwood Landy Raddle M.D.   On: 09/12/2024 16:24   DG Chest Port 1 View Result Date: 09/12/2024 CLINICAL DATA:  Sepsis. EXAM: PORTABLE CHEST 1 VIEW COMPARISON:  07/18/2024 FINDINGS: The cardiac silhouette, mediastinal and contours are within normal limits and stable given the AP projection, portable technique and low lung volumes. Very low lung volumes with vascular crowding and bibasilar atelectasis and scarring. No obvious infiltrates or large effusions. No pneumothorax. The bony thorax is intact. IMPRESSION: Very low lung volumes with vascular crowding and bibasilar atelectasis and scarring. Electronically Signed   By: MYRTIS Stammer M.D.   On: 09/12/2024 11:54   Sleep Study Documents Result Date: 08/16/2024 Ordered by an unspecified provider.   Microbiology: Recent Results (from the past 240 hours)  Blood Culture (routine x 2)     Status: None (Preliminary result)   Collection Time: 09/12/24 10:45 AM   Specimen: BLOOD  Result Value Ref Range Status   Specimen Description BLOOD LEFT AC  Final   Special Requests   Final    BOTTLES DRAWN AEROBIC AND ANAEROBIC Blood Culture adequate volume   Culture   Final    NO GROWTH 2 DAYS Performed at [facility], [address] [place], [place] [address]    Report Status  PENDING  Incomplete  Resp panel by RT-PCR (RSV, Flu A&B, Covid) Anterior Nasal Swab     Status: None   Collection Time: 09/12/24 10:48 AM   Specimen: Anterior Nasal Swab  Result Value Ref Range Status   SARS Coronavirus 2 by RT PCR NEGATIVE NEGATIVE Final    Comment: (NOTE) SARS-CoV-2 target nucleic acids are NOT DETECTED.  The SARS-CoV-2 RNA is generally detectable in upper respiratory specimens during the acute phase of infection. The lowest concentration of SARS-CoV-2 viral copies this assay can detect is 138 copies/mL. A negative result does not preclude SARS-Cov-2 infection and should not be used as the sole basis for treatment or other patient management decisions. A negative result may occur with  improper specimen collection/handling, submission of specimen other than nasopharyngeal swab, presence of viral mutation(s) within the areas targeted by this assay, and inadequate number of viral copies(<138 copies/mL). A negative result must be combined with clinical observations, patient history, and epidemiological information. The expected result is Negative.  Fact Sheet for Patients:  BloggerCourse.com  Fact Sheet for Healthcare Providers:  SeriousBroker.it  This test is no t yet approved or cleared by the [place]  FDA and  has been authorized for detection and/or diagnosis of SARS-CoV-2 by FDA under an Emergency Use Authorization (EUA). This EUA will remain  in effect (meaning this test can be used) for the duration of the COVID-19 declaration under Section 564(b)(1) of the Act, 21 U.S.C.section 360bbb-3(b)(1), unless the authorization is terminated  or revoked sooner.       Influenza A by PCR NEGATIVE NEGATIVE Final   Influenza B by PCR NEGATIVE NEGATIVE Final    Comment: (NOTE) The Xpert Xpress SARS-CoV-2/FLU/RSV plus assay is intended as an aid in the diagnosis of influenza from Nasopharyngeal swab specimens  and should not be used as a sole basis for treatment. Nasal washings and aspirates are unacceptable for Xpert Xpress SARS-CoV-2/FLU/RSV testing.  Fact Sheet for Patients: BloggerCourse.com  Fact Sheet for Healthcare Providers: SeriousBroker.it  This test is not yet approved or cleared by the [place]  FDA and has been authorized for detection and/or diagnosis of SARS-CoV-2 by FDA under an Emergency Use Authorization (EUA). This EUA will remain in effect (meaning this test can be used) for the duration of the COVID-19 declaration under Section 564(b)(1) of the Act, 21 U.S.C. section 360bbb-3(b)(1), unless the authorization is terminated or revoked.     Resp Syncytial Virus by PCR NEGATIVE NEGATIVE Final    Comment: (NOTE) Fact Sheet for Patients: BloggerCourse.com  Fact Sheet for Healthcare Providers: SeriousBroker.it  This test is not yet approved or cleared by the [place]  FDA and has been authorized for detection and/or diagnosis of SARS-CoV-2 by FDA under an Emergency Use Authorization (EUA). This EUA will remain in effect (meaning this test can be used) for the duration of the COVID-19 declaration under Section 564(b)(1) of the Act, 21 U.S.C. section 360bbb-3(b)(1), unless the authorization is terminated or revoked.  Performed at [facility], [address] [place], [place] [address]   Blood Culture (routine x 2)     Status: None (Preliminary result)   Collection Time: 09/12/24 11:12 AM   Specimen: BLOOD  Result Value Ref Range Status   Specimen Description BLOOD BLOOD RIGHT FOREARM  Final   Special Requests   Final    BOTTLES DRAWN AEROBIC AND ANAEROBIC Blood Culture results may not be optimal due to an inadequate volume of blood received in culture bottles   Culture   Final    NO GROWTH 2 DAYS Performed at [facility], [address] [place], [place] [address]    Report Status PENDING  Incomplete  Respiratory (~20 pathogens) panel by PCR     Status: None   Collection Time: 09/12/24  2:46 PM   Specimen: Nasopharyngeal Swab; Respiratory  Result Value Ref Range Status   Adenovirus NOT DETECTED NOT DETECTED Final   Coronavirus 229E NOT DETECTED NOT DETECTED Final    Comment: (NOTE) The Coronavirus on the Respiratory Panel, DOES NOT test for the novel  Coronavirus (2019 nCoV)    Coronavirus HKU1 NOT DETECTED NOT DETECTED Final   Coronavirus NL63 NOT DETECTED NOT DETECTED Final   Coronavirus OC43 NOT DETECTED NOT DETECTED Final   Metapneumovirus NOT DETECTED NOT DETECTED Final   Rhinovirus / Enterovirus NOT DETECTED NOT DETECTED Final   Influenza A NOT DETECTED NOT DETECTED Final   Influenza B NOT DETECTED NOT DETECTED Final   Parainfluenza Virus 1 NOT DETECTED NOT DETECTED Final   Parainfluenza Virus 2 NOT DETECTED NOT DETECTED Final   Parainfluenza Virus 3 NOT DETECTED NOT DETECTED Final   Parainfluenza Virus 4 NOT DETECTED NOT DETECTED Final   Respiratory Syncytial Virus NOT DETECTED NOT DETECTED Final   Bordetella pertussis NOT DETECTED NOT DETECTED Final   Bordetella Parapertussis NOT DETECTED NOT DETECTED Final   Chlamydophila pneumoniae NOT DETECTED NOT DETECTED Final   Mycoplasma pneumoniae NOT DETECTED NOT DETECTED Final    Comment: Performed at [facility], [address], [place], [place] [address]     Labs: CBC: Recent Labs  Lab 09/12/24 1047 09/13/24 0613 09/14/24 0336  WBC 6.4 6.0 10.1  NEUTROABS 4.7  --   --   HGB 14.1 13.1 13.2  HCT 45.0 42.9 42.4  MCV 101.1* 101.2* 101.0*  PLT 167 144* 148*   Basic Metabolic Panel: Recent Labs  Lab 09/12/24 1047 09/13/24 0613 09/14/24 0336  NA 141 141  140  K 5.0 4.3 4.0  CL 101 103 102  CO2 32 30 33*  GLUCOSE 119* 143* 152*  BUN 16 14 24*  CREATININE 0.99 0.96 0.86  CALCIUM  8.4* 8.2* 8.2*  MG 2.2 2.1 2.3  PHOS 4.0 3.8 2.9   Liver  Function Tests: Recent Labs  Lab 09/12/24 1047  AST 16  ALT 9  ALKPHOS 42  BILITOT 1.5*  PROT 6.8  ALBUMIN 3.5   No results for input(s): LIPASE, AMYLASE in the last 168 hours. No results for input(s): AMMONIA in the last 168 hours. Cardiac Enzymes: No results for input(s): CKTOTAL, CKMB, CKMBINDEX, TROPONINI in the last 168 hours. BNP (last 3 results) Recent Labs    12/20/23 1122 07/18/24 1338 09/12/24 1047  BNP 625.5* 200.7* 120.0*   CBG: No results for input(s): GLUCAP in the last 168 hours.  Time spent: 35 minutes  Signed:  Elvan Sor  [facility] 09/14/2024 1:55 PM

## 2024-09-15 ENCOUNTER — Encounter: Payer: Self-pay | Admitting: Sleep Medicine

## 2024-09-15 ENCOUNTER — Ambulatory Visit: Payer: Self-pay | Admitting: Family Medicine

## 2024-09-15 ENCOUNTER — Encounter: Payer: Self-pay | Admitting: Cardiology

## 2024-09-16 ENCOUNTER — Telehealth: Payer: Self-pay

## 2024-09-16 NOTE — Patient Instructions (Signed)
 Visit Information  Thank you for taking time to visit with me today. Please don't hesitate to contact me if I can be of assistance to you.  Patient instructions: Notify provider for any new or worsening symptoms Seek emergency medical services for worsening shortness of breath or chest pain Take medications as prescribed Keep follow up appointments with provider.     Patient verbalizes understanding of instructions and care plan provided today and agrees to view in MyChart. Active MyChart status and patient understanding of how to access instructions and care plan via MyChart confirmed with patient.     The patient has been provided with contact information for the care management team and has been advised to call with any health related questions or concerns.   Please call the care guide team at [phone] if you need to cancel or reschedule your appointment.   Please call the Suicide and Crisis Lifeline: 988 call the [place]  National Suicide Prevention Lifeline: 9378574997 or TTY: 938-180-4649 TTY 7654817954) to talk to a trained counselor call 1-800-273-TALK (toll free, 24 hour hotline) if you are experiencing a Mental Health or Behavioral Health Crisis or need someone to talk to.  Arvin Seip RN, BSN, CCM [employer], Population Health Case Manager Phone: [phone]

## 2024-09-17 ENCOUNTER — Ambulatory Visit: Admitting: Nurse Practitioner

## 2024-09-17 LAB — CULTURE, BLOOD (ROUTINE X 2)
Culture: NO GROWTH
Culture: NO GROWTH
Special Requests: ADEQUATE

## 2024-09-18 ENCOUNTER — Ambulatory Visit: Admitting: Sleep Medicine

## 2024-09-18 ENCOUNTER — Ambulatory Visit: Attending: Nurse Practitioner | Admitting: Nurse Practitioner

## 2024-09-18 ENCOUNTER — Encounter: Payer: Self-pay | Admitting: Nurse Practitioner

## 2024-09-18 ENCOUNTER — Encounter: Payer: Self-pay | Admitting: Sleep Medicine

## 2024-09-18 VITALS — BP 128/78 | HR 69 | Temp 97.9°F | Ht 70.0 in | Wt 238.4 lb

## 2024-09-18 VITALS — BP 94/60 | HR 72 | Ht 70.0 in | Wt 239.5 lb

## 2024-09-18 DIAGNOSIS — I2510 Atherosclerotic heart disease of native coronary artery without angina pectoris: Secondary | ICD-10-CM

## 2024-09-18 DIAGNOSIS — E785 Hyperlipidemia, unspecified: Secondary | ICD-10-CM | POA: Diagnosis not present

## 2024-09-18 DIAGNOSIS — I5032 Chronic diastolic (congestive) heart failure: Secondary | ICD-10-CM

## 2024-09-18 DIAGNOSIS — G4733 Obstructive sleep apnea (adult) (pediatric): Secondary | ICD-10-CM

## 2024-09-18 DIAGNOSIS — I10 Essential (primary) hypertension: Secondary | ICD-10-CM | POA: Diagnosis not present

## 2024-09-18 DIAGNOSIS — I4819 Other persistent atrial fibrillation: Secondary | ICD-10-CM | POA: Diagnosis not present

## 2024-09-18 NOTE — Progress Notes (Signed)
 Office Visit    Patient Name: Todd Gonzales Date of Encounter: 09/18/2024  Primary Care Provider:  Cleatus Arlyss RAMAN, MD Primary Cardiologist:  Alm Clay, MD  Cardiology APP:  Vivienne Lonni Ingle, NP   Chief Complaint    83 y.o. male with a history of paroxysmal atrial fibrillation, hypertension, hyperlipidemia, and nonobstructive coronary artery disease, who presents for follow-up related to HFpEF and atrial fibrillation.   Past Medical History   Subjective   Past Medical History:  Diagnosis Date   Actinic keratosis 03/28/2023   glabella, needs LN2   Acute hypoxemic respiratory failure (HCC)    Aortic atherosclerosis    a. Noted on CT 02/2023.   Arthritis    Basal cell carcinoma 03/28/2023   right paranasal, no tx, cleared with bx   BPH (benign prostatic hyperplasia)    nocturia x4 at baseline   CAD (coronary artery disease)    a. 08/2023 Cor CTA: LM nl, LAD 25-49, LCX <25, RCA 25-49. Ca2+ = 409 (43rd %'ile).   Chronic heart failure with preserved ejection fraction (HFpEF) (HCC)    a. 04/2023 Echo: EF 60-65%, no rwma, mild LVH, nl RV fxn, triv MR; b. 12/2023 TEE: EF 60-65%, nl RV size/fxn, mildly dil LA, no LA/LAA thrombus, mild-mod MR, mild AI; c. 07/2014 Echo: EF 60-65%, no rwma, GrI DD, nl RV fxn.   COPD (chronic obstructive pulmonary disease) (HCC)    Grade I diastolic dysfunction    H/O hiatal hernia    Hemorrhoids    History of cardioversion    Hyperlipidemia    Hypertension    Dr Tisovec- LOV with clearance 3/13 on chart   IGT (impaired glucose tolerance)    last AIC 5.5- diet controlled- per office note 3/13   Incomplete right bundle branch block (RBBB)    Mitral regurgitation    a. 12/2023 TEE: mild to mod MR in setting of afib.   Obesity (BMI 30-39.9)    Obstructive sleep apnea    OSA on CPAP    OSA treated with BiPAP    Paroxysmal atrial fibrillation (HCC) 04/14/2023   a. 04/2023 Admitted with A-fib RVR.  Normal echo.  Discharged on amiodarone ;  b. 05/2023 Zio: Predominantly sinus rhythm at 66 (50-88).  First-degree AV block.  4 beats of NSVT.  No sustained arrhythmias.  Amiodarone  discontinued; c. 12/2023 recurrent Afib->amio->TEE/DCCV.   Paroxysmal atrial fibrillation (HCC)    SCC (squamous cell carcinoma) 02/10/2005   scc in situ-left forearm.   SCC (squamous cell carcinoma) 02/12/2024   L lateral upper pretibia, EDC   SCCA (squamous cell carcinoma) of skin 02/10/2005   Right Outer Eye (in situ) (curet and excision)   SCCA (squamous cell carcinoma) of skin 02/13/2006   Right Ear (well diff) (curet and 5FU)   SCCA (squamous cell carcinoma) of skin 04/05/2006   Left Ear Bowl (in situ) (tx p bx)   SCCA (squamous cell carcinoma) of skin 07/06/2010   Right Cheek (in situ) (curet and zyclara)   SCCA (squamous cell carcinoma) of skin 04/27/2009   Left Upper Forearm (in situ) (curet 5FU)   SCCA (squamous cell carcinoma) of skin 08/28/2014   Right Cheek (well diff)   SCCA (squamous cell carcinoma) of skin 03/21/2016   Left Jawline Ant (well diff) (curet and 5FU)   SCCA (squamous cell carcinoma) of skin 01/19/2017   Right Hand (in situ) (tx p bx)   SCCA (squamous cell carcinoma) of skin 03/04/1840   Left Jawline Inf (in  situ) (curet and 5FU)   SCCA (squamous cell carcinoma) of skin 12/23/2020   Left Forehead (well diff) (tx p bx)   SCCA (squamous cell carcinoma) of skin 12/23/2020   Left Submandibular Area (in situ) (tx p bx)   Seasonal allergies    Spinal stenosis    Squamous cell carcinoma of skin 08/31/2020   in situ-left jawline cheek   Squamous cell carcinoma of skin 04/27/2021   infil- left nasal sidewall(MOHS)   Squamous cell carcinoma of skin 03/28/2023   Left medial cheek above nasolabial, not treated, but cleared with bx   Squamous cell carcinoma of skin 08/16/2023   Left lateral upper pretibia, EDC   Squamous cell carcinoma of skin 05/28/2024   SCC IS,  Left Upper Temple, [facility] 06/10/24   Squamous cell carcinoma of skin  of face 04/27/2021   infil- Left Temporal scalp (MOHS   UTI (lower urinary tract infection)    required admission to [facility]   Past Surgical History:  Procedure Laterality Date   BACK SURGERY  2008   Laminectomy L4-5   CARDIOVERSION N/A 12/22/2023   Procedure: CARDIOVERSION;  Surgeon: Darliss Rogue, MD;  Location: [facility] ORS;  Service: Cardiovascular;  Laterality: N/A;   CARDIOVERSION N/A 08/20/2024   Procedure: CARDIOVERSION;  Surgeon: Argentina Clap, MD;  Location: [facility] ORS;  Service: Cardiovascular;  Laterality: N/A;   JOINT REPLACEMENT Bilateral    bil   KNEE ARTHROSCOPY     right   LUMBAR LAMINECTOMY/DECOMPRESSION MICRODISCECTOMY Bilateral 11/20/2018   Procedure: Bilateral Thoracic Twelve to Lumbar One Laminectomy;  Surgeon: Colon Shove, MD;  Location: [facility] OR;  Service: Neurosurgery;  Laterality: Bilateral;  posterior   TEE WITHOUT CARDIOVERSION N/A 12/22/2023   Procedure: TRANSESOPHAGEAL ECHOCARDIOGRAM (TEE);  Surgeon: Darliss Rogue, MD;  Location: [facility] ORS;  Service: Cardiovascular;  Laterality: N/A;   TEE WITHOUT CARDIOVERSION N/A 08/20/2024   Procedure: ECHOCARDIOGRAM, TRANSESOPHAGEAL;  Surgeon: Argentina Clap, MD;  Location: [facility] ORS;  Service: Cardiovascular;  Laterality: N/A;   TOTAL HIP ARTHROPLASTY  03/20/2012   Procedure: TOTAL HIP ARTHROPLASTY ANTERIOR APPROACH;  Surgeon: Donnice JONETTA Car, MD;  Location: [facility] ORS;  Service: Orthopedics;  Laterality: Left;    Allergies  Allergies  Allergen Reactions   Codeine Other (See Comments)    Unknown reaction   Lipitor [Atorvastatin ] Other (See Comments)    Arthralgias    Bactrim [Sulfamethoxazole-Trimethoprim] Rash       History of Present Illness      83 y.o. y/o male with a history of paroxysmal atrial fibrillation, hypertension, hyperlipidemia, and nonobstructive coronary artery disease.  Patient was hospitalized at [facility] in May 2024 in the setting of dyspnea and fatigue.  He was also found to be in atrial  fibrillation/flutter with PVCs and rapid ventricular rate.  He was initially placed on diltiazem  and subsequently amiodarone  with conversion to sinus rhythm.  Echo showed normal LV function.  He was given a dose of IV Lasix .  He was discharged home on Eliquis  and establish care with Dr. Anner in June 2024.  Amiodarone  was discontinued and he was placed on metoprolol  25 mg twice daily.  Subsequent ZIO monitoring showed predominantly sinus rhythm with a first-degree AV block and an average heart rate of 66.  No sustained arrhythmias or recurrent A-fib noted.   In August 2024, patient required titration of diuretic therapy in the setting of worsening dyspnea and lower extremity swelling.  Coronary CT angiogram in September 2024, showed a calcium  score of 409 (43rd percentile), and mild  to moderate nonobstructive LAD, RCA, and circumflex disease.  Patient was admitted in January 2025 with dyspnea, hypoxia, acute on chronic HFpEF, and A-fib with RVR.  He was placed on amiodarone  and subsequently underwent TEE and cardioversion.  Amiodarone  was subsequently discontinued as planned in April 2025.   In August 2025, patient was seen in cardiology clinic and was found to be in rate controlled atrial fibrillation.  He was asymptomatic at the time though given prior history of heart failure symptoms in the setting of A-fib, he was placed back on amiodarone .  In the setting of some noncompliance with Eliquis  and missing doses, he required TEE cardioversion.  However, the case was aborted due to difficulty maintaining his airway and hypoxia and recommendation was made for cardioversion without TEE after appropriate timeframe of oral anticoagulation and compliance.     Mr. Spivack saw Dr. Anner on September 25, which time he remained in rate controlled atrial fibrillation, but was experiencing nausea.  He was advised to hold amiodarone  for a week and resume at 100 mg daily with plan for cardioversion on October 16.  He  was also volume overloaded was asked to double his Lasix  for 3 days.  Off of amiodarone , feelings of nausea and lethargy did not change and amiodarone  200 mg daily was resumed.  Unfortunately, he was admitted to [facility] on October 9 with dyspnea and hypoxia (69% on room air).  He was treated for COPD exacerbation and community-acquired pneumonia, requiring steroids, nebulizers, antibiotics, and BiPAP at night, which was prescribed at discharge as well.  He was seen by pulmonology this morning with recommendation for home sleep study.  Since his hospital discharge, Mr. Smalls notes that he has felt better than he has in a month or more.  His breathing is much improved, though he still has chronic dyspnea on exertion.  He is getting accustomed to using BiPAP at night.  He has been using lasix  20 mg every other day his wt is stable today at 239 lbs. he sometimes notes mild ankle swelling.  He denies chest pain, palpitations, PND, orthopnea, dizziness, syncope, or early satiety.  Upon review of his hospital records, on the morning of October 7, he was not provided a dose of Eliquis  in the emergency department and therefore, has missed 1 dose in the past 3 weeks.  He has otherwise been compliant at home.  Objective   Home Medications    Current Outpatient Medications  Medication Sig Dispense Refill   acetaminophen  (TYLENOL ) 650 MG CR tablet Take 1,300 mg by mouth 2 (two) times daily as needed for pain.     amiodarone  (PACERONE ) 200 MG tablet TAKE 200 MG TWICE DAILY FOR ONE WEEK, THEN TAKE 200 MG ONCE DAILY. (Patient taking differently: Take 200 mg by mouth daily.) 35 tablet 0   budesonide-glycopyrrolate -formoterol (BREZTRI) 160-9-4.8 MCG/ACT AERO inhaler Inhale 2 puffs into the lungs 2 (two) times daily. 1 each 1   cyanocobalamin  (VITAMIN B12) 1000 MCG/ML injection Inject 1 mL (1,000 mcg total) into the muscle every 30 (thirty) days.     diclofenac  Sodium (VOLTAREN ) 1 % GEL Apply 1 Application  topically every 6 (six) hours as needed (pain).     ELIQUIS  5 MG TABS tablet TAKE ONE TABLET BY MOUTH TWICE A DAY 60 tablet 2   fluorouracil  (EFUDEX ) 5 % cream Apply topically 2 (two) times daily. Bid 5-7 days to temples, cheeks, forehead and nose (Patient taking differently: Apply topically 2 (two) times daily. Bid 5-7 days  to temples, cheeks, forehead and nose.  Wife states patient is using as needed.) 30 g 2   furosemide  (LASIX ) 20 MG tablet Take 1 tablet (20 mg total) by mouth daily. Take 1 to 2 tablets daily as directed for weight gain. 90 tablet 3   gabapentin  (NEURONTIN ) 300 MG capsule TAKE ONE CAPSULE BY MOUTH ONCE DAILY 90 capsule 1   guaiFENesin (MUCINEX) 600 MG 12 hr tablet Take 1 tablet (600 mg total) by mouth 2 (two) times daily for 5 days. 10 tablet 0   hydrocortisone  (ANUSOL -HC) 2.5 % rectal cream PLACE ONE APPLICATION RECTALLY TWO TIMES DAILY 30 g 1   ondansetron  (ZOFRAN ) 4 MG tablet TAKE ONE TABLET (4 MG TOTAL) BY MOUTH EVERY EIGHT (EIGHT) HOURS AS NEEDED FOR NAUSEA OR VOMITING. 20 tablet 1   pantoprazole (PROTONIX) 40 MG tablet Take 1 tablet (40 mg total) by mouth daily for 7 days. 7 tablet 0   polyethylene glycol powder (GLYCOLAX /MIRALAX ) 17 GM/SCOOP powder Take 17 g by mouth daily. Dissolve 1 capful (17g) in 4-8 ounces of liquid and take by mouth daily.     tamsulosin  (FLOMAX ) 0.4 MG CAPS capsule Take 1 capsule (0.4 mg total) by mouth daily. 90 capsule 3   triamcinolone  cream (KENALOG ) 0.1 % Apply 1-2 times daily to aa, lower legs for up to 2 weeks. Avoid applying to face, groin, and axilla. Use as directed. Long-term use can cause thinning of the skin. 45 g 1   No current facility-administered medications for this visit.     Physical Exam    VS:  BP 94/60 (BP Location: Left Arm, Patient Position: Sitting, Cuff Size: Large)   Pulse 72   Ht 5' 10 (1.778 m)   Wt 239 lb 8 oz (108.6 kg)   SpO2 97%   BMI 34.36 kg/m  , BMI Body mass index is 34.36 kg/m.          GEN:  Obese, in no acute distress. HEENT: normal. Neck: Supple, obese, no bruits or masses.  Difficult to gauge JVP due to body habitus. Cardiac: Irregularly irregular, no murmurs, rubs, or gallops. No clubbing, cyanosis, trace ankle edema bilaterally.  Radials 2+/PT 2+ and equal bilaterally.  Respiratory:  Respirations regular and unlabored, clear to auscultation bilaterally. GI: Obese, protuberant, soft, nontender, nondistended, BS + x 4. MS: no deformity or atrophy. Skin: warm and dry, no rash. Neuro:  Strength and sensation are intact. Psych: Normal affect.  Accessory Clinical Findings    ECG personally reviewed by me today - EKG Interpretation Date/Time:  Wednesday September 18 2024 15:09:28 EDT Ventricular Rate:  72 PR Interval:    QRS Duration:  82 QT Interval:  366 QTC Calculation: 400 R Axis:   -5  Text Interpretation: Atrial fibrillation Possible Anterior infarct , age undetermined Confirmed by Vivienne Bruckner [phone]) on 09/18/2024 3:17:50 PM   - no acute changes.  Lab Results  Component Value Date   WBC 10.1 09/14/2024   HGB 13.2 09/14/2024   HCT 42.4 09/14/2024   MCV 101.0 (H) 09/14/2024   PLT 148 (L) 09/14/2024   Lab Results  Component Value Date   CREATININE 0.86 09/14/2024   BUN 24 (H) 09/14/2024   NA 140 09/14/2024   K 4.0 09/14/2024   CL 102 09/14/2024   CO2 33 (H) 09/14/2024   Lab Results  Component Value Date   ALT 9 09/12/2024   AST 16 09/12/2024   ALKPHOS 42 09/12/2024   BILITOT 1.5 (H) 09/12/2024   Lab  Results  Component Value Date   CHOL 109 04/16/2023   HDL 41 04/16/2023   LDLCALC 58 04/16/2023   TRIG 51 04/16/2023   CHOLHDL 2.7 04/16/2023    Lab Results  Component Value Date   HGBA1C 5.3 12/26/2022   Lab Results  Component Value Date   TSH 2.480 08/19/2024       Assessment & Plan    1.  Permanent atrial fibrillation:  S/p prior DCCV in January 2025 with recurrence of A-fib in August 2025.  He has since been on amiodarone , which  after initial concerns about tolerance, he now seems to be tolerating.  In the past, he developed volume overload in the setting of atrial fibrillation.  He was recently hospitalized with hypoxia and community-acquired pneumonia, but is now recovering well.  He is anticoagulated with Eliquis  5 mg twice daily.  Prior effort to perform TEE cardioversion was aborted due to hypoxia, with recommendation to wait to perform cardioversion until after he has been compliant with Eliquis  for at least 3 weeks.  The patient has been compliant with Eliquis  at home, verified by his wife, when he was in the emergency department with hypoxia last week, he missed 1 dose of Eliquis  that morning.  In that setting, we agreed to cancel cardioversion which was scheduled for tomorrow and he will continue Eliquis  5 mg twice daily as well as current dose of amiodarone  with plan for follow-up here in approximately 3 weeks, which time we will confirm rhythm, obtain labs, and reschedule cardioversion.  Patient and wife were agreeable with this plan.  2.  Chronic heart failure with preserved ejection fraction: Echocardiogram earlier this year showed an EF of 60 to 65% without regional wall motion abnormalities, grade 1 diastolic dysfunction, and aortic sclerosis.  His volume has been managed with Lasix  20 mg as needed and he notes that he has been taking this roughly every other day.  Weight today is 239 pounds, which is more or less in line with his prehospitalization weights.  He has trace ankle edema on exam but otherwise does not appear to be significantly volume overloaded.  Heart rate and blood pressure are stable.  No changes today.  3.  Primary hypertension: Blood pressure soft today at 94/60.  He is asymptomatic.  Continue to follow.  4.  Hyperlipidemia: LDL of 58 last year.  He is statin intolerant.  5.  Nonobstructive CAD: Noted on CT angiogram in 2024.  No statin in setting of prior intolerance and LDL of 58 last year.  No  aspirin  in the setting of Eliquis .  6.  Chronic hypoxic respiratory failure/COPD/recent community-acquired pneumonia: Status post recent hospitalization with significant improvement in respiratory status following antibiotics, nebulizers, and steroids.  Now followed by pulmonology.  Plan for outpatient sleep study.  7.  Disposition: Patient will follow-up in approximately 3-1/2 weeks, at which time we will obtain labs and reschedule cardioversion provided that he has been compliant with Eliquis .  [name], NP 09/18/2024, 4:10 PM

## 2024-09-18 NOTE — Patient Instructions (Signed)

## 2024-09-18 NOTE — Patient Instructions (Addendum)
 Medication Instructions:  No changes *If you need a refill on your cardiac medications before your next appointment, please call your pharmacy*  Lab Work: None ordered If you have labs (blood work) drawn today and your tests are completely normal, you will receive your results only by: MyChart Message (if you have MyChart) OR A paper copy in the mail If you have any lab test that is abnormal or we need to change your treatment, we will call you to review the results.  Testing/Procedures: None ordered  Follow-Up: At [facility], you and your health needs are our priority.  As part of our continuing mission to provide you with exceptional heart care, our providers are all part of one team.  This team includes your primary Cardiologist (physician) and Advanced Practice Providers or APPs (Physician Assistants and Nurse Practitioners) who all work together to provide you with the care you need, when you need it.  Your next appointment:   Follow up with Dr. Anner or [name] Meager, NP between 11/9-11/15/25

## 2024-09-18 NOTE — Progress Notes (Signed)
 Name:Todd Gonzales MRN: [id number] DOB: 10/30/1941   CHIEF COMPLAINT:  PAP F/U   HISTORY OF PRESENT ILLNESS: Todd Gonzales is a 83 y.o. w/ a h/o OSA, HTN, CHF, atrial fibrillation and obesity who presents for BiPAP follow up visit. Reports difficulty using PAP therapy due to pressure discomfort. He is currently using the Vitera FFM, which causes significant air leaks.     EPWORTH SLEEP SCORE     09/18/2024   11:05 AM 04/24/2024   10:00 AM  Results of the Epworth flowsheet  Sitting and reading 2 3  Watching TV 3 3  Sitting, inactive in a public place (e.g. a theatre or a meeting) 2 3  As a passenger in a car for an hour without a break 2 3  Lying down to rest in the afternoon when circumstances permit 3 2  Sitting and talking to someone 2 3  Sitting quietly after a lunch without alcohol 2 3  In a car, while stopped for a few minutes in traffic 0 1  Total score 16 21    PAST MEDICAL HISTORY :   has a past medical history of Actinic keratosis (03/28/2023), Acute hypoxemic respiratory failure (HCC), Aortic atherosclerosis, Arthritis, Basal cell carcinoma (03/28/2023), BPH (benign prostatic hyperplasia), CAD (coronary artery disease), Chronic heart failure with preserved ejection fraction (HFpEF) (HCC), COPD (chronic obstructive pulmonary disease) (HCC), Grade I diastolic dysfunction, H/O hiatal hernia, Hemorrhoids, History of cardioversion, Hyperlipidemia, Hypertension, IGT (impaired glucose tolerance), Incomplete right bundle branch block (RBBB), Mitral regurgitation, Obesity (BMI 30-39.9), Obstructive sleep apnea, OSA on CPAP, OSA treated with BiPAP, Paroxysmal atrial fibrillation (HCC) (04/14/2023), Paroxysmal atrial fibrillation (HCC), SCC (squamous cell carcinoma) (02/10/2005), SCC (squamous cell carcinoma) (02/12/2024), SCCA (squamous cell carcinoma) of skin (02/10/2005), SCCA (squamous cell carcinoma) of skin (02/13/2006), SCCA (squamous cell carcinoma) of skin (04/05/2006), SCCA  (squamous cell carcinoma) of skin (07/06/2010), SCCA (squamous cell carcinoma) of skin (04/27/2009), SCCA (squamous cell carcinoma) of skin (08/28/2014), SCCA (squamous cell carcinoma) of skin (03/21/2016), SCCA (squamous cell carcinoma) of skin (01/19/2017), SCCA (squamous cell carcinoma) of skin (03/04/1840), SCCA (squamous cell carcinoma) of skin (12/23/2020), SCCA (squamous cell carcinoma) of skin (12/23/2020), Seasonal allergies, Spinal stenosis, Squamous cell carcinoma of skin (08/31/2020), Squamous cell carcinoma of skin (04/27/2021), Squamous cell carcinoma of skin (03/28/2023), Squamous cell carcinoma of skin (08/16/2023), Squamous cell carcinoma of skin (05/28/2024), Squamous cell carcinoma of skin of face (04/27/2021), and UTI (lower urinary tract infection).  has a past surgical history that includes Back surgery (2008); Knee arthroscopy; Total hip arthroplasty (03/20/2012); Joint replacement (Bilateral); Lumbar laminectomy/decompression microdiscectomy (Bilateral, 11/20/2018); TEE without cardioversion (N/A, 12/22/2023); Cardioversion (N/A, 12/22/2023); TEE without cardioversion (N/A, 08/20/2024); and Cardioversion (N/A, 08/20/2024). Prior to Admission medications   Medication Sig Start Date End Date Taking? Authorizing Provider  acetaminophen  (TYLENOL ) 650 MG CR tablet Take 1,300 mg by mouth 2 (two) times daily as needed for pain.   Yes [provider]  amiodarone  (PACERONE ) 200 MG tablet TAKE 200 MG TWICE DAILY FOR ONE WEEK, THEN TAKE 200 MG ONCE DAILY. Patient taking differently: Take 200 mg by mouth daily. 08/26/24  Yes Vivienne Lonni Ingle, NP  budesonide-glycopyrrolate -formoterol (BREZTRI) 160-9-4.8 MCG/ACT AERO inhaler Inhale 2 puffs into the lungs 2 (two) times daily. 09/14/24 09/14/25 Yes Von Bellis, MD  cyanocobalamin  (VITAMIN B12) 1000 MCG/ML injection Inject 1 mL (1,000 mcg total) into the muscle every 30 (thirty) days. 01/26/23  Yes Cleatus Arlyss RAMAN, MD  diclofenac  Sodium  (VOLTAREN ) 1 % GEL  Apply 1 Application topically every 6 (six) hours as needed (pain). 09/05/16  Yes [provider]  ELIQUIS  5 MG TABS tablet TAKE ONE TABLET BY MOUTH TWICE A DAY 07/30/24  Yes Cleatus Arlyss RAMAN, MD  fluorouracil  (EFUDEX ) 5 % cream Apply topically 2 (two) times daily. Bid 5-7 days to temples, cheeks, forehead and nose Patient taking differently: Apply topically 2 (two) times daily. Bid 5-7 days to temples, cheeks, forehead and nose.  Wife states patient is using as needed. 02/12/24  Yes Jackquline Sawyer, MD  furosemide  (LASIX ) 20 MG tablet Take 1 tablet (20 mg total) by mouth daily. Take 1 to 2 tablets daily as directed for weight gain. 08/29/24 11/27/24 Yes Anner Alm ORN, MD  gabapentin  (NEURONTIN ) 300 MG capsule TAKE ONE CAPSULE BY MOUTH ONCE DAILY 05/02/24  Yes Cleatus Arlyss RAMAN, MD  guaiFENesin (MUCINEX) 600 MG 12 hr tablet Take 1 tablet (600 mg total) by mouth 2 (two) times daily for 5 days. 09/14/24 09/19/24 Yes Von Bellis, MD  hydrocortisone  (ANUSOL -HC) 2.5 % rectal cream PLACE ONE APPLICATION RECTALLY TWO TIMES DAILY 08/30/24  Yes Cleatus Arlyss RAMAN, MD  ondansetron  (ZOFRAN ) 4 MG tablet TAKE ONE TABLET (4 MG TOTAL) BY MOUTH EVERY EIGHT (EIGHT) HOURS AS NEEDED FOR NAUSEA OR VOMITING. 11/01/23  Yes Cleatus Arlyss RAMAN, MD  pantoprazole (PROTONIX) 40 MG tablet Take 1 tablet (40 mg total) by mouth daily for 7 days. 09/14/24 09/21/24 Yes Von Bellis, MD  polyethylene glycol powder (GLYCOLAX /MIRALAX ) 17 GM/SCOOP powder Take 17 g by mouth daily. Dissolve 1 capful (17g) in 4-8 ounces of liquid and take by mouth daily.   Yes [provider]  tamsulosin  (FLOMAX ) 0.4 MG CAPS capsule Take 1 capsule (0.4 mg total) by mouth daily. 08/14/24  Yes Cleatus Arlyss RAMAN, MD  triamcinolone  cream (KENALOG ) 0.1 % Apply 1-2 times daily to aa, lower legs for up to 2 weeks. Avoid applying to face, groin, and axilla. Use as directed. Long-term use can cause thinning of the skin. 06/10/24  Yes Jackquline Sawyer, MD   Allergies  Allergen Reactions   Codeine Other (See Comments)    Unknown reaction   Lipitor [Atorvastatin ] Other (See Comments)    Arthralgias    Bactrim [Sulfamethoxazole-Trimethoprim] Rash    FAMILY HISTORY:  family history includes Arthritis in his mother; Asthma in his father; Lung cancer in his father. SOCIAL HISTORY:  reports that he has never smoked. He has never used smokeless tobacco. He reports current alcohol use of about 2.0 standard drinks of alcohol per week. He reports that he does not use drugs.   Review of Systems:  Gen:  Denies  fever, sweats, chills weight loss  HEENT: Denies blurred vision, double vision, ear pain, eye pain, hearing loss, nose bleeds, sore throat Cardiac:  No dizziness, chest pain or heaviness, chest tightness,edema, No JVD Resp:   No cough, -sputum production, -shortness of breath,-wheezing, -hemoptysis,  Gi: Denies swallowing difficulty, stomach pain, nausea or vomiting, diarrhea, constipation, bowel incontinence Gu:  Denies bladder incontinence, burning urine Ext:   Denies Joint pain, stiffness or swelling Skin: Denies  skin rash, easy bruising or bleeding or hives Endoc:  Denies polyuria, polydipsia , polyphagia or weight change Psych:   Denies depression, insomnia or hallucinations  Other:  All other systems negative  VITAL SIGNS: BP 128/78   Pulse 69   Temp 97.9 F (36.6 C) (Temporal)   Ht 5' 10 (1.778 m)   Wt 238 lb 6.4 oz (108.1 kg)   SpO2  97%   BMI 34.21 kg/m    Physical Examination:   General Appearance: No distress  EYES PERRLA, EOM intact.   NECK Supple, No JVD Pulmonary: normal breath sounds, No wheezing.  CardiovascularNormal S1,S2.  No m/r/g.   Abdomen: Benign, Soft, non-tender. Skin:   warm, no rashes, no ecchymosis  Extremities: normal, no cyanosis, clubbing. Neuro:without focal findings,  speech normal  PSYCHIATRIC: Mood, affect within normal limits.   ASSESSMENT AND PLAN  OSA Counseled  patient on proper mask fit. For pressure discomfort, will change to auto pressure with Min EPAP to 4 cm H2O. Discussed the consequences of untreated sleep apnea. Advised not to drive drowsy for safety of patient and others. Will follow up in 3 months.     HTN Stable, on current management. Following with PCP.    Patient  satisfied with Plan of action and management. All questions answered  I spent a total of 49 minutes reviewing chart data, face-to-face evaluation with the patient, counseling and coordination of care as detailed above.    Randy Whitener, M.D.  Sleep Medicine  Pulmonary & Critical Care Medicine

## 2024-09-19 ENCOUNTER — Encounter: Admission: RE | Payer: Self-pay | Source: Home / Self Care

## 2024-09-19 ENCOUNTER — Telehealth: Payer: Self-pay

## 2024-09-19 ENCOUNTER — Ambulatory Visit: Admission: RE | Admit: 2024-09-19 | Source: Home / Self Care | Admitting: Cardiovascular Disease

## 2024-09-19 SURGERY — CARDIOVERSION
Anesthesia: General

## 2024-09-19 NOTE — Telephone Encounter (Signed)
 FYI      Copied from CRM [id number]. Topic: General - Other >> Sep 19, 2024  8:27 AM Rosina BIRCH wrote: Reason for CRM: Olam from [facility] called stating they got a referral from the hospital for physical therapy and nursing and the start of care is delayed. They will see the patient on 10/17 [phone]

## 2024-09-20 DIAGNOSIS — Z7951 Long term (current) use of inhaled steroids: Secondary | ICD-10-CM | POA: Diagnosis not present

## 2024-09-20 DIAGNOSIS — N39498 Other specified urinary incontinence: Secondary | ICD-10-CM | POA: Diagnosis not present

## 2024-09-20 DIAGNOSIS — J189 Pneumonia, unspecified organism: Secondary | ICD-10-CM | POA: Diagnosis not present

## 2024-09-20 DIAGNOSIS — J9602 Acute respiratory failure with hypercapnia: Secondary | ICD-10-CM | POA: Diagnosis not present

## 2024-09-20 DIAGNOSIS — M1990 Unspecified osteoarthritis, unspecified site: Secondary | ICD-10-CM | POA: Diagnosis not present

## 2024-09-20 DIAGNOSIS — G4733 Obstructive sleep apnea (adult) (pediatric): Secondary | ICD-10-CM | POA: Diagnosis not present

## 2024-09-20 DIAGNOSIS — I4519 Other right bundle-branch block: Secondary | ICD-10-CM | POA: Diagnosis not present

## 2024-09-20 DIAGNOSIS — J441 Chronic obstructive pulmonary disease with (acute) exacerbation: Secondary | ICD-10-CM | POA: Diagnosis not present

## 2024-09-20 DIAGNOSIS — I340 Nonrheumatic mitral (valve) insufficiency: Secondary | ICD-10-CM | POA: Diagnosis not present

## 2024-09-20 DIAGNOSIS — E785 Hyperlipidemia, unspecified: Secondary | ICD-10-CM | POA: Diagnosis not present

## 2024-09-20 DIAGNOSIS — Z87440 Personal history of urinary (tract) infections: Secondary | ICD-10-CM | POA: Diagnosis not present

## 2024-09-20 DIAGNOSIS — Z6833 Body mass index (BMI) 33.0-33.9, adult: Secondary | ICD-10-CM | POA: Diagnosis not present

## 2024-09-20 DIAGNOSIS — I110 Hypertensive heart disease with heart failure: Secondary | ICD-10-CM | POA: Diagnosis not present

## 2024-09-20 DIAGNOSIS — J440 Chronic obstructive pulmonary disease with acute lower respiratory infection: Secondary | ICD-10-CM | POA: Diagnosis not present

## 2024-09-20 DIAGNOSIS — I700 Atherosclerosis of aorta: Secondary | ICD-10-CM | POA: Diagnosis not present

## 2024-09-20 DIAGNOSIS — J9601 Acute respiratory failure with hypoxia: Secondary | ICD-10-CM | POA: Diagnosis not present

## 2024-09-20 DIAGNOSIS — I5030 Unspecified diastolic (congestive) heart failure: Secondary | ICD-10-CM | POA: Diagnosis not present

## 2024-09-20 DIAGNOSIS — Z85828 Personal history of other malignant neoplasm of skin: Secondary | ICD-10-CM | POA: Diagnosis not present

## 2024-09-20 DIAGNOSIS — E66812 Obesity, class 2: Secondary | ICD-10-CM | POA: Diagnosis not present

## 2024-09-20 DIAGNOSIS — I480 Paroxysmal atrial fibrillation: Secondary | ICD-10-CM | POA: Diagnosis not present

## 2024-09-20 DIAGNOSIS — Z7901 Long term (current) use of anticoagulants: Secondary | ICD-10-CM | POA: Diagnosis not present

## 2024-09-20 DIAGNOSIS — Z9981 Dependence on supplemental oxygen: Secondary | ICD-10-CM | POA: Diagnosis not present

## 2024-09-20 DIAGNOSIS — I2510 Atherosclerotic heart disease of native coronary artery without angina pectoris: Secondary | ICD-10-CM | POA: Diagnosis not present

## 2024-09-20 DIAGNOSIS — N401 Enlarged prostate with lower urinary tract symptoms: Secondary | ICD-10-CM | POA: Diagnosis not present

## 2024-09-20 NOTE — Telephone Encounter (Signed)
 Noted. Thanks.

## 2024-09-21 DIAGNOSIS — R400 Somnolence: Secondary | ICD-10-CM | POA: Diagnosis not present

## 2024-09-24 DIAGNOSIS — Z6833 Body mass index (BMI) 33.0-33.9, adult: Secondary | ICD-10-CM | POA: Diagnosis not present

## 2024-09-24 DIAGNOSIS — Z7901 Long term (current) use of anticoagulants: Secondary | ICD-10-CM | POA: Diagnosis not present

## 2024-09-24 DIAGNOSIS — I4519 Other right bundle-branch block: Secondary | ICD-10-CM | POA: Diagnosis not present

## 2024-09-24 DIAGNOSIS — J441 Chronic obstructive pulmonary disease with (acute) exacerbation: Secondary | ICD-10-CM | POA: Diagnosis not present

## 2024-09-24 DIAGNOSIS — I110 Hypertensive heart disease with heart failure: Secondary | ICD-10-CM | POA: Diagnosis not present

## 2024-09-24 DIAGNOSIS — N39498 Other specified urinary incontinence: Secondary | ICD-10-CM | POA: Diagnosis not present

## 2024-09-24 DIAGNOSIS — I5030 Unspecified diastolic (congestive) heart failure: Secondary | ICD-10-CM | POA: Diagnosis not present

## 2024-09-24 DIAGNOSIS — I480 Paroxysmal atrial fibrillation: Secondary | ICD-10-CM | POA: Diagnosis not present

## 2024-09-24 DIAGNOSIS — G4733 Obstructive sleep apnea (adult) (pediatric): Secondary | ICD-10-CM | POA: Diagnosis not present

## 2024-09-24 DIAGNOSIS — E66812 Obesity, class 2: Secondary | ICD-10-CM | POA: Diagnosis not present

## 2024-09-24 DIAGNOSIS — J440 Chronic obstructive pulmonary disease with acute lower respiratory infection: Secondary | ICD-10-CM | POA: Diagnosis not present

## 2024-09-24 DIAGNOSIS — E785 Hyperlipidemia, unspecified: Secondary | ICD-10-CM | POA: Diagnosis not present

## 2024-09-24 DIAGNOSIS — I340 Nonrheumatic mitral (valve) insufficiency: Secondary | ICD-10-CM | POA: Diagnosis not present

## 2024-09-24 DIAGNOSIS — M1990 Unspecified osteoarthritis, unspecified site: Secondary | ICD-10-CM | POA: Diagnosis not present

## 2024-09-24 DIAGNOSIS — Z85828 Personal history of other malignant neoplasm of skin: Secondary | ICD-10-CM | POA: Diagnosis not present

## 2024-09-24 DIAGNOSIS — J9601 Acute respiratory failure with hypoxia: Secondary | ICD-10-CM | POA: Diagnosis not present

## 2024-09-24 DIAGNOSIS — Z9981 Dependence on supplemental oxygen: Secondary | ICD-10-CM | POA: Diagnosis not present

## 2024-09-24 DIAGNOSIS — I700 Atherosclerosis of aorta: Secondary | ICD-10-CM | POA: Diagnosis not present

## 2024-09-24 DIAGNOSIS — J9602 Acute respiratory failure with hypercapnia: Secondary | ICD-10-CM | POA: Diagnosis not present

## 2024-09-24 DIAGNOSIS — I2510 Atherosclerotic heart disease of native coronary artery without angina pectoris: Secondary | ICD-10-CM | POA: Diagnosis not present

## 2024-09-24 DIAGNOSIS — Z7951 Long term (current) use of inhaled steroids: Secondary | ICD-10-CM | POA: Diagnosis not present

## 2024-09-24 DIAGNOSIS — J189 Pneumonia, unspecified organism: Secondary | ICD-10-CM | POA: Diagnosis not present

## 2024-09-24 DIAGNOSIS — Z87440 Personal history of urinary (tract) infections: Secondary | ICD-10-CM | POA: Diagnosis not present

## 2024-09-24 DIAGNOSIS — N401 Enlarged prostate with lower urinary tract symptoms: Secondary | ICD-10-CM | POA: Diagnosis not present

## 2024-09-26 DIAGNOSIS — N401 Enlarged prostate with lower urinary tract symptoms: Secondary | ICD-10-CM | POA: Diagnosis not present

## 2024-09-26 DIAGNOSIS — Z7951 Long term (current) use of inhaled steroids: Secondary | ICD-10-CM | POA: Diagnosis not present

## 2024-09-26 DIAGNOSIS — J441 Chronic obstructive pulmonary disease with (acute) exacerbation: Secondary | ICD-10-CM | POA: Diagnosis not present

## 2024-09-26 DIAGNOSIS — E785 Hyperlipidemia, unspecified: Secondary | ICD-10-CM | POA: Diagnosis not present

## 2024-09-26 DIAGNOSIS — I2510 Atherosclerotic heart disease of native coronary artery without angina pectoris: Secondary | ICD-10-CM | POA: Diagnosis not present

## 2024-09-26 DIAGNOSIS — G4733 Obstructive sleep apnea (adult) (pediatric): Secondary | ICD-10-CM | POA: Diagnosis not present

## 2024-09-26 DIAGNOSIS — I5030 Unspecified diastolic (congestive) heart failure: Secondary | ICD-10-CM | POA: Diagnosis not present

## 2024-09-26 DIAGNOSIS — J9602 Acute respiratory failure with hypercapnia: Secondary | ICD-10-CM | POA: Diagnosis not present

## 2024-09-26 DIAGNOSIS — I700 Atherosclerosis of aorta: Secondary | ICD-10-CM | POA: Diagnosis not present

## 2024-09-26 DIAGNOSIS — M1990 Unspecified osteoarthritis, unspecified site: Secondary | ICD-10-CM | POA: Diagnosis not present

## 2024-09-26 DIAGNOSIS — I4519 Other right bundle-branch block: Secondary | ICD-10-CM | POA: Diagnosis not present

## 2024-09-26 DIAGNOSIS — Z87440 Personal history of urinary (tract) infections: Secondary | ICD-10-CM | POA: Diagnosis not present

## 2024-09-26 DIAGNOSIS — Z9981 Dependence on supplemental oxygen: Secondary | ICD-10-CM | POA: Diagnosis not present

## 2024-09-26 DIAGNOSIS — I340 Nonrheumatic mitral (valve) insufficiency: Secondary | ICD-10-CM | POA: Diagnosis not present

## 2024-09-26 DIAGNOSIS — J9601 Acute respiratory failure with hypoxia: Secondary | ICD-10-CM | POA: Diagnosis not present

## 2024-09-26 DIAGNOSIS — N39498 Other specified urinary incontinence: Secondary | ICD-10-CM | POA: Diagnosis not present

## 2024-09-26 DIAGNOSIS — E66812 Obesity, class 2: Secondary | ICD-10-CM | POA: Diagnosis not present

## 2024-09-26 DIAGNOSIS — Z6833 Body mass index (BMI) 33.0-33.9, adult: Secondary | ICD-10-CM | POA: Diagnosis not present

## 2024-09-26 DIAGNOSIS — Z7901 Long term (current) use of anticoagulants: Secondary | ICD-10-CM | POA: Diagnosis not present

## 2024-09-26 DIAGNOSIS — I110 Hypertensive heart disease with heart failure: Secondary | ICD-10-CM | POA: Diagnosis not present

## 2024-09-26 DIAGNOSIS — I480 Paroxysmal atrial fibrillation: Secondary | ICD-10-CM | POA: Diagnosis not present

## 2024-09-26 DIAGNOSIS — Z85828 Personal history of other malignant neoplasm of skin: Secondary | ICD-10-CM | POA: Diagnosis not present

## 2024-09-26 DIAGNOSIS — J189 Pneumonia, unspecified organism: Secondary | ICD-10-CM | POA: Diagnosis not present

## 2024-09-26 DIAGNOSIS — J440 Chronic obstructive pulmonary disease with acute lower respiratory infection: Secondary | ICD-10-CM | POA: Diagnosis not present

## 2024-10-01 ENCOUNTER — Other Ambulatory Visit: Payer: Self-pay | Admitting: Cardiology

## 2024-10-02 DIAGNOSIS — Z85828 Personal history of other malignant neoplasm of skin: Secondary | ICD-10-CM | POA: Diagnosis not present

## 2024-10-02 DIAGNOSIS — N401 Enlarged prostate with lower urinary tract symptoms: Secondary | ICD-10-CM | POA: Diagnosis not present

## 2024-10-02 DIAGNOSIS — J441 Chronic obstructive pulmonary disease with (acute) exacerbation: Secondary | ICD-10-CM | POA: Diagnosis not present

## 2024-10-02 DIAGNOSIS — I340 Nonrheumatic mitral (valve) insufficiency: Secondary | ICD-10-CM | POA: Diagnosis not present

## 2024-10-02 DIAGNOSIS — I5030 Unspecified diastolic (congestive) heart failure: Secondary | ICD-10-CM | POA: Diagnosis not present

## 2024-10-02 DIAGNOSIS — I2510 Atherosclerotic heart disease of native coronary artery without angina pectoris: Secondary | ICD-10-CM | POA: Diagnosis not present

## 2024-10-02 DIAGNOSIS — I4519 Other right bundle-branch block: Secondary | ICD-10-CM | POA: Diagnosis not present

## 2024-10-02 DIAGNOSIS — I110 Hypertensive heart disease with heart failure: Secondary | ICD-10-CM | POA: Diagnosis not present

## 2024-10-02 DIAGNOSIS — Z6833 Body mass index (BMI) 33.0-33.9, adult: Secondary | ICD-10-CM | POA: Diagnosis not present

## 2024-10-02 DIAGNOSIS — G4733 Obstructive sleep apnea (adult) (pediatric): Secondary | ICD-10-CM | POA: Diagnosis not present

## 2024-10-02 DIAGNOSIS — E66812 Obesity, class 2: Secondary | ICD-10-CM | POA: Diagnosis not present

## 2024-10-02 DIAGNOSIS — Z9981 Dependence on supplemental oxygen: Secondary | ICD-10-CM | POA: Diagnosis not present

## 2024-10-02 DIAGNOSIS — N39498 Other specified urinary incontinence: Secondary | ICD-10-CM | POA: Diagnosis not present

## 2024-10-02 DIAGNOSIS — I700 Atherosclerosis of aorta: Secondary | ICD-10-CM | POA: Diagnosis not present

## 2024-10-02 DIAGNOSIS — J189 Pneumonia, unspecified organism: Secondary | ICD-10-CM | POA: Diagnosis not present

## 2024-10-02 DIAGNOSIS — Z7951 Long term (current) use of inhaled steroids: Secondary | ICD-10-CM | POA: Diagnosis not present

## 2024-10-02 DIAGNOSIS — J440 Chronic obstructive pulmonary disease with acute lower respiratory infection: Secondary | ICD-10-CM | POA: Diagnosis not present

## 2024-10-02 DIAGNOSIS — J9602 Acute respiratory failure with hypercapnia: Secondary | ICD-10-CM | POA: Diagnosis not present

## 2024-10-02 DIAGNOSIS — Z7901 Long term (current) use of anticoagulants: Secondary | ICD-10-CM | POA: Diagnosis not present

## 2024-10-02 DIAGNOSIS — E785 Hyperlipidemia, unspecified: Secondary | ICD-10-CM | POA: Diagnosis not present

## 2024-10-02 DIAGNOSIS — I480 Paroxysmal atrial fibrillation: Secondary | ICD-10-CM | POA: Diagnosis not present

## 2024-10-02 DIAGNOSIS — J9601 Acute respiratory failure with hypoxia: Secondary | ICD-10-CM | POA: Diagnosis not present

## 2024-10-02 DIAGNOSIS — Z87440 Personal history of urinary (tract) infections: Secondary | ICD-10-CM | POA: Diagnosis not present

## 2024-10-02 DIAGNOSIS — M1990 Unspecified osteoarthritis, unspecified site: Secondary | ICD-10-CM | POA: Diagnosis not present

## 2024-10-03 ENCOUNTER — Telehealth: Payer: Self-pay | Admitting: Family Medicine

## 2024-10-03 ENCOUNTER — Other Ambulatory Visit: Payer: Self-pay | Admitting: Family Medicine

## 2024-10-03 DIAGNOSIS — M5412 Radiculopathy, cervical region: Secondary | ICD-10-CM

## 2024-10-03 NOTE — Telephone Encounter (Signed)
 Gabapentin  Last filled:  08/08/24, #90 Last OV:  07/02/24, urinary frequency Next OV:  none

## 2024-10-03 NOTE — Telephone Encounter (Unsigned)
 Copied from CRM #[id number]. Topic: General - Other >> Oct 03, 2024  4:28 PM Aisha D wrote: Reason for CRM: Darina a nurse with [facility] stated that he had an appt scheduled today with the pt but no one was home. Darina just wants to inform Dr.Duncan of the missed appt today.

## 2024-10-03 NOTE — Telephone Encounter (Signed)
 Copied from CRM [id number]. Topic: Clinical - Prescription Issue >> Oct 03, 2024  2:35 PM Sasha M wrote: Reason for CRM: Signe from [facility], [phone], needing to move the eval to next week

## 2024-10-04 NOTE — Telephone Encounter (Signed)
 Sent. Thanks.  Please schedule OV when possible.

## 2024-10-04 NOTE — Telephone Encounter (Signed)
 Noted. Thanks.

## 2024-10-07 NOTE — Telephone Encounter (Signed)
 I called and left a voicemail for patient to be scheduled.

## 2024-10-14 ENCOUNTER — Telehealth: Payer: Self-pay

## 2024-10-14 ENCOUNTER — Telehealth: Payer: Self-pay | Admitting: Cardiology

## 2024-10-14 NOTE — Telephone Encounter (Signed)
 Patient is scheduled to see [name], NP on 10/18/24 for labs and rescheduling his cardioversion. Patient would like to know if he could come for labs and reschedule the cardioversion at his visit with Dr. Kennyth on 10/30/24. Please advise.

## 2024-10-14 NOTE — Telephone Encounter (Signed)
 Copied from CRM [id number]. Topic: Clinical - Order For Equipment >> Oct 14, 2024 10:47 AM Todd Gonzales wrote: Reason for CRM: patient is requesting to have bipap machine turned down to the lowest level, states he feels too much air pressure

## 2024-10-15 ENCOUNTER — Ambulatory Visit: Admitting: Internal Medicine

## 2024-10-15 DIAGNOSIS — E785 Hyperlipidemia, unspecified: Secondary | ICD-10-CM | POA: Diagnosis not present

## 2024-10-15 DIAGNOSIS — I110 Hypertensive heart disease with heart failure: Secondary | ICD-10-CM | POA: Diagnosis not present

## 2024-10-15 DIAGNOSIS — G4733 Obstructive sleep apnea (adult) (pediatric): Secondary | ICD-10-CM | POA: Diagnosis not present

## 2024-10-15 DIAGNOSIS — M1990 Unspecified osteoarthritis, unspecified site: Secondary | ICD-10-CM | POA: Diagnosis not present

## 2024-10-15 DIAGNOSIS — Z87440 Personal history of urinary (tract) infections: Secondary | ICD-10-CM | POA: Diagnosis not present

## 2024-10-15 DIAGNOSIS — I4519 Other right bundle-branch block: Secondary | ICD-10-CM | POA: Diagnosis not present

## 2024-10-15 DIAGNOSIS — Z6833 Body mass index (BMI) 33.0-33.9, adult: Secondary | ICD-10-CM | POA: Diagnosis not present

## 2024-10-15 DIAGNOSIS — J441 Chronic obstructive pulmonary disease with (acute) exacerbation: Secondary | ICD-10-CM | POA: Diagnosis not present

## 2024-10-15 DIAGNOSIS — N401 Enlarged prostate with lower urinary tract symptoms: Secondary | ICD-10-CM | POA: Diagnosis not present

## 2024-10-15 DIAGNOSIS — J440 Chronic obstructive pulmonary disease with acute lower respiratory infection: Secondary | ICD-10-CM | POA: Diagnosis not present

## 2024-10-15 DIAGNOSIS — I5030 Unspecified diastolic (congestive) heart failure: Secondary | ICD-10-CM | POA: Diagnosis not present

## 2024-10-15 DIAGNOSIS — J9601 Acute respiratory failure with hypoxia: Secondary | ICD-10-CM | POA: Diagnosis not present

## 2024-10-15 DIAGNOSIS — I480 Paroxysmal atrial fibrillation: Secondary | ICD-10-CM | POA: Diagnosis not present

## 2024-10-15 DIAGNOSIS — J9602 Acute respiratory failure with hypercapnia: Secondary | ICD-10-CM | POA: Diagnosis not present

## 2024-10-15 DIAGNOSIS — Z7951 Long term (current) use of inhaled steroids: Secondary | ICD-10-CM | POA: Diagnosis not present

## 2024-10-15 DIAGNOSIS — Z7901 Long term (current) use of anticoagulants: Secondary | ICD-10-CM | POA: Diagnosis not present

## 2024-10-15 DIAGNOSIS — I340 Nonrheumatic mitral (valve) insufficiency: Secondary | ICD-10-CM | POA: Diagnosis not present

## 2024-10-15 DIAGNOSIS — E66812 Obesity, class 2: Secondary | ICD-10-CM | POA: Diagnosis not present

## 2024-10-15 DIAGNOSIS — Z85828 Personal history of other malignant neoplasm of skin: Secondary | ICD-10-CM | POA: Diagnosis not present

## 2024-10-15 DIAGNOSIS — I2510 Atherosclerotic heart disease of native coronary artery without angina pectoris: Secondary | ICD-10-CM | POA: Diagnosis not present

## 2024-10-15 DIAGNOSIS — Z9981 Dependence on supplemental oxygen: Secondary | ICD-10-CM | POA: Diagnosis not present

## 2024-10-15 DIAGNOSIS — I700 Atherosclerosis of aorta: Secondary | ICD-10-CM | POA: Diagnosis not present

## 2024-10-15 DIAGNOSIS — N39498 Other specified urinary incontinence: Secondary | ICD-10-CM | POA: Diagnosis not present

## 2024-10-15 DIAGNOSIS — J189 Pneumonia, unspecified organism: Secondary | ICD-10-CM | POA: Diagnosis not present

## 2024-10-15 NOTE — Telephone Encounter (Signed)
 Left a message for the patient to call back.

## 2024-10-15 NOTE — Telephone Encounter (Signed)
 If he has been clinically stable and wishes to defer cardioversion until after he sees Dr. Kennyth, yes, he can hold off on labs until that appointment and cancel the appt w/ me.

## 2024-10-16 ENCOUNTER — Institutional Professional Consult (permissible substitution): Admitting: Cardiology

## 2024-10-17 DIAGNOSIS — I2510 Atherosclerotic heart disease of native coronary artery without angina pectoris: Secondary | ICD-10-CM | POA: Diagnosis not present

## 2024-10-17 DIAGNOSIS — Z7901 Long term (current) use of anticoagulants: Secondary | ICD-10-CM | POA: Diagnosis not present

## 2024-10-17 DIAGNOSIS — J440 Chronic obstructive pulmonary disease with acute lower respiratory infection: Secondary | ICD-10-CM | POA: Diagnosis not present

## 2024-10-17 DIAGNOSIS — I110 Hypertensive heart disease with heart failure: Secondary | ICD-10-CM | POA: Diagnosis not present

## 2024-10-17 DIAGNOSIS — J189 Pneumonia, unspecified organism: Secondary | ICD-10-CM | POA: Diagnosis not present

## 2024-10-17 DIAGNOSIS — I340 Nonrheumatic mitral (valve) insufficiency: Secondary | ICD-10-CM | POA: Diagnosis not present

## 2024-10-17 DIAGNOSIS — E66812 Obesity, class 2: Secondary | ICD-10-CM | POA: Diagnosis not present

## 2024-10-17 DIAGNOSIS — J9601 Acute respiratory failure with hypoxia: Secondary | ICD-10-CM | POA: Diagnosis not present

## 2024-10-17 DIAGNOSIS — Z85828 Personal history of other malignant neoplasm of skin: Secondary | ICD-10-CM | POA: Diagnosis not present

## 2024-10-17 DIAGNOSIS — Z87440 Personal history of urinary (tract) infections: Secondary | ICD-10-CM | POA: Diagnosis not present

## 2024-10-17 DIAGNOSIS — N401 Enlarged prostate with lower urinary tract symptoms: Secondary | ICD-10-CM | POA: Diagnosis not present

## 2024-10-17 DIAGNOSIS — J441 Chronic obstructive pulmonary disease with (acute) exacerbation: Secondary | ICD-10-CM | POA: Diagnosis not present

## 2024-10-17 DIAGNOSIS — E785 Hyperlipidemia, unspecified: Secondary | ICD-10-CM | POA: Diagnosis not present

## 2024-10-17 DIAGNOSIS — I5030 Unspecified diastolic (congestive) heart failure: Secondary | ICD-10-CM | POA: Diagnosis not present

## 2024-10-17 DIAGNOSIS — I4519 Other right bundle-branch block: Secondary | ICD-10-CM | POA: Diagnosis not present

## 2024-10-17 DIAGNOSIS — G4733 Obstructive sleep apnea (adult) (pediatric): Secondary | ICD-10-CM | POA: Diagnosis not present

## 2024-10-17 DIAGNOSIS — N39498 Other specified urinary incontinence: Secondary | ICD-10-CM | POA: Diagnosis not present

## 2024-10-17 DIAGNOSIS — M1990 Unspecified osteoarthritis, unspecified site: Secondary | ICD-10-CM | POA: Diagnosis not present

## 2024-10-17 DIAGNOSIS — Z7951 Long term (current) use of inhaled steroids: Secondary | ICD-10-CM | POA: Diagnosis not present

## 2024-10-17 DIAGNOSIS — Z9981 Dependence on supplemental oxygen: Secondary | ICD-10-CM | POA: Diagnosis not present

## 2024-10-17 DIAGNOSIS — I480 Paroxysmal atrial fibrillation: Secondary | ICD-10-CM | POA: Diagnosis not present

## 2024-10-17 DIAGNOSIS — J9602 Acute respiratory failure with hypercapnia: Secondary | ICD-10-CM | POA: Diagnosis not present

## 2024-10-17 DIAGNOSIS — Z6833 Body mass index (BMI) 33.0-33.9, adult: Secondary | ICD-10-CM | POA: Diagnosis not present

## 2024-10-17 DIAGNOSIS — I700 Atherosclerosis of aorta: Secondary | ICD-10-CM | POA: Diagnosis not present

## 2024-10-17 NOTE — Telephone Encounter (Signed)
 Spoke with the patient. He stated that he was feeling fine and would prefer to cancel his appointment and wait to see Dr. Kennyth on 11/26. He has been advised to call before if anything is needed.

## 2024-10-17 NOTE — Telephone Encounter (Signed)
 The patient is already at 18 cmH20. Can it go down anymore?

## 2024-10-18 ENCOUNTER — Telehealth: Payer: Self-pay

## 2024-10-18 ENCOUNTER — Ambulatory Visit: Admitting: Nurse Practitioner

## 2024-10-18 NOTE — Telephone Encounter (Signed)
 I have changed the settings and notified the patient.  Nothing further needed.

## 2024-10-18 NOTE — Telephone Encounter (Signed)
Is it okay to give verbal orders?  

## 2024-10-18 NOTE — Telephone Encounter (Signed)
 Please give the order.  Thanks.

## 2024-10-18 NOTE — Telephone Encounter (Signed)
 Copied from CRM [id number]. Topic: Clinical - Home Health Verbal Orders >> Oct 18, 2024  9:38 AM [name] wrote: Caller/Agency: [facility]  Callback Number: [phone] Service Requested: Occupational Therapy Frequency: 1x a wk for 6 wks. Any new concerns about the patient? No

## 2024-10-18 NOTE — Telephone Encounter (Signed)
 Verbal orders given

## 2024-10-22 ENCOUNTER — Encounter: Payer: Self-pay | Admitting: Family Medicine

## 2024-10-22 ENCOUNTER — Ambulatory Visit: Admitting: Family Medicine

## 2024-10-22 VITALS — BP 126/76 | HR 61 | Temp 97.9°F | Ht 70.0 in | Wt 238.0 lb

## 2024-10-22 DIAGNOSIS — K5900 Constipation, unspecified: Secondary | ICD-10-CM | POA: Diagnosis not present

## 2024-10-22 DIAGNOSIS — Z0000 Encounter for general adult medical examination without abnormal findings: Secondary | ICD-10-CM

## 2024-10-22 DIAGNOSIS — G4733 Obstructive sleep apnea (adult) (pediatric): Secondary | ICD-10-CM | POA: Diagnosis not present

## 2024-10-22 DIAGNOSIS — E538 Deficiency of other specified B group vitamins: Secondary | ICD-10-CM | POA: Diagnosis not present

## 2024-10-22 DIAGNOSIS — Z7189 Other specified counseling: Secondary | ICD-10-CM

## 2024-10-22 DIAGNOSIS — R8290 Unspecified abnormal findings in urine: Secondary | ICD-10-CM | POA: Diagnosis not present

## 2024-10-22 DIAGNOSIS — R400 Somnolence: Secondary | ICD-10-CM | POA: Diagnosis not present

## 2024-10-22 DIAGNOSIS — R300 Dysuria: Secondary | ICD-10-CM

## 2024-10-22 DIAGNOSIS — I480 Paroxysmal atrial fibrillation: Secondary | ICD-10-CM

## 2024-10-22 DIAGNOSIS — I10 Essential (primary) hypertension: Secondary | ICD-10-CM

## 2024-10-22 DIAGNOSIS — M4800 Spinal stenosis, site unspecified: Secondary | ICD-10-CM

## 2024-10-22 MED ORDER — ONDANSETRON HCL 4 MG PO TABS: 4.0000 mg | ORAL_TABLET | Freq: Every day | ORAL | Status: AC | PRN

## 2024-10-22 MED ORDER — FUROSEMIDE 20 MG PO TABS: 20.0000 mg | ORAL_TABLET | Freq: Every day | ORAL | Status: AC | PRN

## 2024-10-22 MED ORDER — CYANOCOBALAMIN 1000 MCG/ML IJ SOLN
1000.0000 ug | Freq: Once | INTRAMUSCULAR | Status: AC
  Administered 2024-10-22: 1000 ug via INTRAMUSCULAR

## 2024-10-22 NOTE — Patient Instructions (Addendum)
 Please update your contact info.   Go to the lab on the way out.   If you have mychart we'll likely use that to update you.    Take care.  Glad to see you. B12 shot monthly.

## 2024-10-22 NOTE — Progress Notes (Unsigned)
 Cloudy urine recently, yesterday but not today.  No sx today.  No burning with urination.  Urinalysis and urine culture pending.  B12 def. Due for replacement.  Dose given at office visit.  Constipation. Taking miralax  nightly.  Taking metamucil prn, magnesium  prn.  Discussed attempting MiraLAX  twice a day if needed.  OSA.  D/w pt about CPAP use.  He hasn't been using CPAP.   He couldn't tolerate prev pressure setting but it was lowered for him to retry.  Intermittently weak/fatigued.  Discussed that untreated sleep apnea could contribute to his symptoms.  CHF/AF.  Breathing is clearly better.  He isn't using breztri.    Vaccines d/w pt.  He had flu and covid shot at [facility] [employer].  Colon and prostate cancer screening not due  Advanced directive discussed with patient. Wife designated if patient were incapacitated. Then daughter next if needed.    He is taking gabapentin  at baseline for back and knee pain.  Back pain is worse standing, clearly better sitting and laying down.  Discussed pathophysiology of spinal stenosis.  Meds, vitals, and allergies reviewed.   ROS: Per HPI unless specifically indicated in ROS section   Nad Nact Neck supple, no LA IRR.   Not tachy Ctab  Abd soft, not ttp Skin well perfused . Trace BLE.

## 2024-10-23 DIAGNOSIS — Z0000 Encounter for general adult medical examination without abnormal findings: Secondary | ICD-10-CM | POA: Insufficient documentation

## 2024-10-23 LAB — URINE CULTURE
MICRO NUMBER:: 17250583
Result:: NO GROWTH
SPECIMEN QUALITY:: ADEQUATE

## 2024-10-23 LAB — COMPREHENSIVE METABOLIC PANEL WITH GFR
ALT: 8 U/L (ref 0–53)
AST: 15 U/L (ref 0–37)
Albumin: 4.1 g/dL (ref 3.5–5.2)
Alkaline Phosphatase: 45 U/L (ref 39–117)
BUN: 16 mg/dL (ref 6–23)
CO2: 32 meq/L (ref 19–32)
Calcium: 9 mg/dL (ref 8.4–10.5)
Chloride: 101 meq/L (ref 96–112)
Creatinine, Ser: 0.98 mg/dL (ref 0.40–1.50)
GFR: 70.19 mL/min (ref >60.00)
Glucose, Bld: 89 mg/dL (ref 70–99)
Potassium: 4.9 meq/L (ref 3.5–5.1)
Sodium: 141 meq/L (ref 135–145)
Total Bilirubin: 1.6 mg/dL — ABNORMAL HIGH (ref 0.2–1.2)
Total Protein: 6.4 g/dL (ref 6.0–8.3)

## 2024-10-23 LAB — CBC WITH DIFFERENTIAL/PLATELET
Basophils Absolute: 0.1 K/uL (ref 0.0–0.1)
Basophils Relative: 0.8 % (ref 0.0–3.0)
Eosinophils Absolute: 0.1 K/uL (ref 0.0–0.7)
Eosinophils Relative: 1.4 % (ref 0.0–5.0)
HCT: 44.5 % (ref 39.0–52.0)
Hemoglobin: 14.6 g/dL (ref 13.0–17.0)
Lymphocytes Relative: 22.2 % (ref 12.0–46.0)
Lymphs Abs: 1.3 K/uL (ref 0.7–4.0)
MCHC: 32.9 g/dL (ref 30.0–36.0)
MCV: 95.7 fl (ref 78.0–100.0)
Monocytes Absolute: 0.6 K/uL (ref 0.1–1.0)
Monocytes Relative: 9.2 % (ref 3.0–12.0)
Neutro Abs: 4 K/uL (ref 1.4–7.7)
Neutrophils Relative %: 66.4 % (ref 43.0–77.0)
Platelets: 194 K/uL (ref 150.0–400.0)
RBC: 4.65 Mil/uL (ref 4.22–5.81)
RDW: 14.8 % (ref 11.5–15.5)
WBC: 6 K/uL (ref 4.0–10.5)

## 2024-10-23 LAB — URINALYSIS, ROUTINE W REFLEX MICROSCOPIC
Bilirubin Urine: NEGATIVE
Hgb urine dipstick: NEGATIVE
Leukocytes,Ua: NEGATIVE
Nitrite: NEGATIVE
RBC / HPF: NONE SEEN (ref 0–?)
Specific Gravity, Urine: 1.025 (ref 1.000–1.030)
Total Protein, Urine: 30 — AB
Urine Glucose: NEGATIVE
Urobilinogen, UA: 1 (ref 0.0–1.0)
pH: 6 (ref 5.0–8.0)

## 2024-10-23 LAB — LIPID PANEL
Cholesterol: 167 mg/dL (ref 0–200)
HDL: 51.1 mg/dL (ref >39.00)
LDL Cholesterol: 94 mg/dL (ref 0–99)
NonHDL: 115.91
Total CHOL/HDL Ratio: 3
Triglycerides: 111 mg/dL (ref 0.0–149.0)
VLDL: 22.2 mg/dL (ref 0.0–40.0)

## 2024-10-23 NOTE — Assessment & Plan Note (Signed)
-   Urinalysis and urine culture pending

## 2024-10-23 NOTE — Assessment & Plan Note (Signed)
 Vaccines d/w pt.  He had flu and covid shot at [facility].  Colon and prostate cancer screening not due  Advanced directive discussed with patient. Wife designated if patient were incapacitated. Then daughter next if needed.

## 2024-10-23 NOTE — Assessment & Plan Note (Signed)
 D/w pt about CPAP use.  He hasn't been using CPAP.   He couldn't tolerate prev pressure setting but it was lowered for him to retry.  Intermittently weak/fatigued.  Discussed that untreated sleep apnea could contribute to his symptoms.

## 2024-10-23 NOTE — Assessment & Plan Note (Signed)
 Advanced directive discussed with patient. Wife designated if patient were incapacitated. Then daughter next if needed.

## 2024-10-23 NOTE — Assessment & Plan Note (Signed)
 Anatomy discussed.  Would continue with gabapentin  as is.

## 2024-10-23 NOTE — Assessment & Plan Note (Signed)
Per cardiology.  I will defer. 

## 2024-10-23 NOTE — Assessment & Plan Note (Signed)
 B12 def. Due for replacement.  Dose given at office visit.

## 2024-10-23 NOTE — Assessment & Plan Note (Signed)
 Discussed attempting MiraLAX  twice a day if needed.

## 2024-10-24 ENCOUNTER — Ambulatory Visit: Admitting: Internal Medicine

## 2024-10-27 ENCOUNTER — Ambulatory Visit: Payer: Self-pay | Admitting: Family Medicine

## 2024-10-29 ENCOUNTER — Telehealth: Payer: Self-pay | Admitting: Family Medicine

## 2024-10-29 ENCOUNTER — Ambulatory Visit

## 2024-10-29 NOTE — Progress Notes (Unsigned)
 Electrophysiology Office Note:   Date:  10/30/2024  ID:  Todd Gonzales, DOB [birth year], MRN [id number]  Primary Cardiologist: Alm Clay, MD Electrophysiologist: Fonda Kitty, MD      History of Present Illness:   Todd Gonzales is a 83 y.o. male with h/o paroxysmal atrial fibrillation, hypertension, hyperlipidemia, nonobstructive CAD who is being seen today for AF management.  Discussed the use of AI scribe software for clinical note transcription with the patient, who gave verbal consent to proceed.  History of Present Illness Todd Gonzales is an 83 year old male with atrial fibrillation and flutter who presents for follow-up of his heart rhythm management. He was referred by Dr. Clay for evaluation of his atrial fibrillation and flutter.  He has a history of atrial fibrillation and flutter, initially detected in January 2025. Episodes of atrial fibrillation were noted in August 2025, progressing to atrial flutter. He was initially scheduled for cardioversion but was started on amiodarone . He is currently on amiodarone  200 mg once daily. He also takes Eliquis  to minimize stroke risk associated with atrial fibrillation. No noticeable symptoms of atrial fibrillation are reported, but his wife notes episodes of fatigue and decreased activity levels, which have improved since hospitalization in early 2025.  He experiences episodes of low oxygen saturation, notably dropping to 77% during a previous hospital visit, which was managed with supplemental oxygen. He uses a pulse oximeter daily to monitor his oxygen levels and is engaged in home-based rehabilitation exercises to improve his condition.  He reports mobility issues, which have been worsening, and he now uses a walker for most activities. He is concerned about the risk of falls, especially while on blood thinners.  He takes Lasix  every other day to manage fluid retention, adjusting the dose based on his symptoms and dietary  intake, particularly around the holidays when he may consume more salt.  His blood pressure is generally on the lower side, and he is not currently on any antihypertensive medications. He notes that his blood pressure tends to be higher during doctor visits.  He lives in a retirement community and participates in social gatherings, although his activity is sometimes limited by his health.  Review of systems complete and found to be negative unless listed in HPI.   EP Information / Studies Reviewed:    EKG is ordered today. Personal review as below.  EKG Interpretation Date/Time:  Wednesday October 30 2024 10:39:08 EST Ventricular Rate:  62 PR Interval:  216 QRS Duration:  114 QT Interval:  406 QTC Calculation: 412 R Axis:   37  Text Interpretation: Sinus rhythm with 1st degree A-V block Incomplete right bundle branch block Nonspecific T wave abnormality When compared with ECG of 18-Sep-2024 15:09, Sinus rhythm has replaced Atrial fibrillation Incomplete right bundle branch block is now Present Borderline criteria for Anterior infarct are no longer Present Confirmed by Kitty Fonda [id number]) on 10/30/2024 10:58:04 AM   EKG 08/19/24: Typical AFL   Echo 07/19/24:   1. Left ventricular ejection fraction, by estimation, is 60 to 65%. Left  ventricular ejection fraction by PLAX is 63 %. The left ventricle has  normal function. The left ventricle has no regional wall motion  abnormalities. Left ventricular diastolic  parameters are consistent with Grade I diastolic dysfunction (impaired  relaxation).   2. Right ventricular systolic function is normal. The right ventricular  size is normal.   3. The mitral valve is normal in structure. No evidence of mitral valve  regurgitation.  4. The aortic valve is calcified. Aortic valve regurgitation is not  visualized. Aortic valve sclerosis/calcification is present, without any  evidence of aortic stenosis. Aortic valve mean gradient measures  9.8 mmHg.   5. The inferior vena cava is dilated in size with >50% respiratory  variability, suggesting right atrial pressure of 8 mmHg.   Coronary CTA 08/28/23: IMPRESSION: 1. Coronary calcium  score of 409. This was 43rd percentile for age and sex matched control.   2. Normal coronary origin with right dominance.   3. Mild LAD and RCA stenosis (25-49%).   4. Minimal LCx stenosis (<25%).   5. CAD-RADS 2. Mild non-obstructive CAD (25-49%). Consider non-atherosclerotic causes of chest pain. Consider preventive therapy and risk factor modification.   Physical Exam:   VS:  BP (!) 146/82   Pulse 62   Ht 5' 10 (1.778 m)   Wt 236 lb 6.4 oz (107.2 kg)   SpO2 93%   BMI 33.92 kg/m    Wt Readings from Last 3 Encounters:  10/30/24 236 lb 6.4 oz (107.2 kg)  10/22/24 238 lb (108 kg)  09/18/24 239 lb 8 oz (108.6 kg)     GEN: Well nourished, well developed in no acute distress NECK: No JVD CARDIAC: Normal rate, regular rhythm RESPIRATORY:  Clear to auscultation without rales, wheezing or rhonchi  ABDOMEN: Soft, non-distended EXTREMITIES: Trace edema; No deformity   ASSESSMENT AND PLAN:    #Persistent atrial fibrillation: Associated with clinical heart failure. For this reason, we have prioritized a rhythm control strategy. #Typical atrial flutter: #High risk medication use: Amiodarone .  AST, ALT normal 10/2024.  TSH normal 08/2024. - Converted back to sinus rhythm after amiodarone  load.  Appears to be maintaining sinus rhythm since amiodarone  resumed on a regular basis.  He is tolerating amiodarone  currently without any obvious side effects.  He is not an ideal ablation candidate due to body habitus, advanced age, and limited functional status.  Recommend continuing oral amiodarone  200mg  daily for now as long as it is working.  He is a chart diagnosis of COPD but appears to have no parenchymal lung disease; I do see OSA well-documented however which should not prohibit amiodarone  use.   Continue follow-up with pulmonology.  He also obtains regular eye exams which he will continue.  #Hypercoagulable state due to AF:  - Continue Eliquis  5 mg twice daily for stroke prophylaxis.  Currently tolerating without bleeding issues.  Fall precautions were provided.  #Chronic diastolic heart failure: Worsened in AF, likely the primary driver of his hypoxic event. -Rhythm control strategy as above.  -Continue Lasix  and follow-up with general cardiology.  Follow up with EP Team in 6 months  Signed, Fonda Kitty, MD

## 2024-10-29 NOTE — Telephone Encounter (Signed)
 Copied from CRM #[id number]. Topic: General - Other >> Oct 29, 2024  3:18 PM Eva FALCON wrote: Reason for CRM: Darina is a PT with [facility] and states he was scheduled to see him today but when he called him last night he declined the appointment and declined visit for this week. Any other questions or concerns please reach Darina [phone].

## 2024-10-30 ENCOUNTER — Encounter: Payer: Self-pay | Admitting: Cardiology

## 2024-10-30 ENCOUNTER — Ambulatory Visit: Attending: Cardiology | Admitting: Cardiology

## 2024-10-30 VITALS — BP 146/82 | HR 62 | Ht 70.0 in | Wt 236.4 lb

## 2024-10-30 DIAGNOSIS — D6869 Other thrombophilia: Secondary | ICD-10-CM | POA: Diagnosis not present

## 2024-10-30 DIAGNOSIS — I4819 Other persistent atrial fibrillation: Secondary | ICD-10-CM

## 2024-10-30 DIAGNOSIS — I483 Typical atrial flutter: Secondary | ICD-10-CM | POA: Diagnosis not present

## 2024-10-30 DIAGNOSIS — Z79899 Other long term (current) drug therapy: Secondary | ICD-10-CM | POA: Diagnosis not present

## 2024-10-30 DIAGNOSIS — I480 Paroxysmal atrial fibrillation: Secondary | ICD-10-CM | POA: Diagnosis not present

## 2024-10-30 DIAGNOSIS — I5032 Chronic diastolic (congestive) heart failure: Secondary | ICD-10-CM

## 2024-10-30 NOTE — Patient Instructions (Signed)
 Medication Instructions:  The current medical regimen is effective;  continue present plan and medications.  *If you need a refill on your cardiac medications before your next appointment, please call your pharmacy*  Follow-Up: At [facility], you and your health needs are our priority.  As part of our continuing mission to provide you with exceptional heart care, our providers are all part of one team.  This team includes your primary Cardiologist (physician) and Advanced Practice Providers or APPs (Physician Assistants and Nurse Practitioners) who all work together to provide you with the care you need, when you need it.  Your next appointment:   6 month(s)  Provider:   You will see one of the following Advanced Practice Providers on your designated Care Team:   Charlies Arthur, [place] Ozell Jodie Passey, PA-C Suzann Riddle, NP Daphne Barrack, NP Artist Pouch, PA-C       We recommend signing up for the patient portal called MyChart.  Sign up information is provided on this After Visit Summary.  MyChart is used to connect with patients for Virtual Visits (Telemedicine).  Patients are able to view lab/test results, encounter notes, upcoming appointments, etc.  Non-urgent messages can be sent to your provider as well.   To learn more about what you can do with MyChart, go to [email or web address].

## 2024-10-30 NOTE — Telephone Encounter (Signed)
 Noted. I'll defer to patient.

## 2024-11-05 ENCOUNTER — Encounter: Payer: Self-pay | Admitting: Cardiology

## 2024-11-05 DIAGNOSIS — H2513 Age-related nuclear cataract, bilateral: Secondary | ICD-10-CM | POA: Diagnosis not present

## 2024-11-05 DIAGNOSIS — H2511 Age-related nuclear cataract, right eye: Secondary | ICD-10-CM | POA: Diagnosis not present

## 2024-11-06 ENCOUNTER — Telehealth: Payer: Self-pay | Admitting: Cardiology

## 2024-11-06 DIAGNOSIS — M170 Bilateral primary osteoarthritis of knee: Secondary | ICD-10-CM | POA: Diagnosis not present

## 2024-11-06 NOTE — Telephone Encounter (Signed)
   Patient Name: MACEO HERNAN  DOB: 01/19/1941 MRN: [id number]  Primary Cardiologist: Alm Clay, MD  Chart reviewed as part of pre-operative protocol coverage. Cataract extractions are recognized in guidelines as low risk surgeries that do not typically require specific preoperative testing or holding of blood thinner therapy. Therefore, given past medical history and time since last visit, based on ACC/AHA guidelines, ZAHARI XIANG would be at acceptable risk for the planned procedure without further cardiovascular testing.   I will route this recommendation to the requesting party via Epic fax function and remove from pre-op pool.  Please call with questions.  Jon Garre Arlyne Brandes, PA 11/06/2024, 9:28 AM

## 2024-11-06 NOTE — Telephone Encounter (Signed)
   Pre-operative Risk Assessment    Patient Name: Todd Gonzales  DOB: 07/15/1941 MRN: [id number]   Date of last office visit: unknown Date of next office visit: unknown   Request for Surgical Clearance    Procedure:  cataract surgery  Date of Surgery:  Clearance 11/14/24                                Surgeon:  MD Feliciano CHRISTELLA Ober Surgeon's Group or Practice Name:  [facility] Phone number:  [phone] Fax number:  [phone]   Type of Clearance Requested:   - Medical    Type of Anesthesia:  Not Indicated   Additional requests/questions:    SignedBerwyn LELON Sprung   11/06/2024, 8:58 AM

## 2024-11-07 ENCOUNTER — Other Ambulatory Visit: Payer: Self-pay | Admitting: Family Medicine

## 2024-11-07 ENCOUNTER — Ambulatory Visit: Payer: Self-pay

## 2024-11-07 DIAGNOSIS — I4891 Unspecified atrial fibrillation: Secondary | ICD-10-CM

## 2024-11-07 NOTE — Telephone Encounter (Signed)
 FYI Only or Action Required?: FYI only for provider: from PT.  Patient was last seen in primary care on 10/22/2024 by Cleatus Arlyss RAMAN, MD.  Called Nurse Triage reporting Hypertension.  Symptoms began today.  Interventions attempted: Rest, hydration, or home remedies.  Symptoms are: stable.  Triage Disposition: See PCP Within 2 Weeks  Patient/caregiver understands and will follow disposition?: No, wishes to speak with PCP  Message from Alfonso ORN sent at 11/07/2024  3:28 PM EST  Reason for Triage: Reason for CRM: PT with [facility] called to advise pt has high bp 176/82 and requested pt be called back to f/u and also wants to know if physical activity should be paused at this time.   Reason for Disposition  [1] Systolic BP >= 130 OR Diastolic >= 80 AND [2] taking BP medications  Answer Assessment - Initial Assessment Questions [facility] PT called to inform RN of elevated BP- no # for PT left-  Spoke with patient his BP was elevated during session. 176/82. Hadnt taken his meds yet so far and was working on financials/paying bills which elevated his stress. Also had 2 shots of cortisone in his knees yesterday. Therapist told patient his wrist cuff was not accurate. Wrist cuff checked twice while on call- 62/44 and 190/101. Agreed the cuff is not accurate.  O2 96% Denies CP, SOB, Dizziness, headache, vision changes,unilateral weakness or speech changes. He lives at [facility] and has an in house 911 call button. He understands precautions. He will look into a new cuff.  Will continue to monitor at home for any new sx.  1. BLOOD PRESSURE: What is your blood pressure? Did you take at least two measurements 5 minutes apart?   BP 176/82--  62/44 and now 190/101 2. ONSET: When did you take your blood pressure?     Today  3. HOW: How did you take your blood pressure? (e.g., automatic home BP monitor, visiting nurse)     Arm cuff with PT, on home wrist cuff- a high and low reading so  inaccurate 4. HISTORY: Do you have a history of high blood pressure?     Afib, Aflutter 5. MEDICINES: Are you taking any medicines for blood pressure? Have you missed any doses recently?     Amiodarone , lasix  6. OTHER SYMPTOMS: Do you have any symptoms? (e.g., blurred vision, chest pain, difficulty breathing, headache, weakness)     denies  Protocols used: Blood Pressure - High-A-AH

## 2024-11-08 NOTE — Telephone Encounter (Signed)
 Agree with getting a new BP cuff.  What questions does he have in the meantime?  Please let me know.  Thanks.

## 2024-11-08 NOTE — Telephone Encounter (Signed)
 Eliquis  Last filled:  10/04/24, #60 Last OV:  10/12/24, annual exam Next OV:  none

## 2024-11-11 NOTE — Telephone Encounter (Signed)
 Spoke with patient, he says his BP is much better. Still using the wrist BP cuff and plans on getting a new cuff. He has no other questions.

## 2024-11-13 ENCOUNTER — Encounter: Payer: Self-pay | Admitting: Ophthalmology

## 2024-11-14 ENCOUNTER — Ambulatory Visit
Admission: RE | Admit: 2024-11-14 | Discharge: 2024-11-14 | Disposition: A | Attending: Ophthalmology | Admitting: Ophthalmology

## 2024-11-14 ENCOUNTER — Ambulatory Visit: Admitting: Anesthesiology

## 2024-11-14 ENCOUNTER — Encounter: Payer: Self-pay | Admitting: Ophthalmology

## 2024-11-14 ENCOUNTER — Other Ambulatory Visit: Payer: Self-pay

## 2024-11-14 ENCOUNTER — Encounter: Admission: RE | Disposition: A | Payer: Self-pay | Attending: Ophthalmology

## 2024-11-14 DIAGNOSIS — H2511 Age-related nuclear cataract, right eye: Secondary | ICD-10-CM | POA: Diagnosis not present

## 2024-11-14 DIAGNOSIS — M1990 Unspecified osteoarthritis, unspecified site: Secondary | ICD-10-CM | POA: Diagnosis not present

## 2024-11-14 DIAGNOSIS — E66813 Obesity, class 3: Secondary | ICD-10-CM | POA: Diagnosis not present

## 2024-11-14 DIAGNOSIS — I110 Hypertensive heart disease with heart failure: Secondary | ICD-10-CM | POA: Diagnosis not present

## 2024-11-14 DIAGNOSIS — I2510 Atherosclerotic heart disease of native coronary artery without angina pectoris: Secondary | ICD-10-CM | POA: Diagnosis not present

## 2024-11-14 DIAGNOSIS — G4733 Obstructive sleep apnea (adult) (pediatric): Secondary | ICD-10-CM | POA: Diagnosis not present

## 2024-11-14 DIAGNOSIS — I509 Heart failure, unspecified: Secondary | ICD-10-CM | POA: Diagnosis not present

## 2024-11-14 DIAGNOSIS — Z6833 Body mass index (BMI) 33.0-33.9, adult: Secondary | ICD-10-CM | POA: Diagnosis not present

## 2024-11-14 DIAGNOSIS — I5032 Chronic diastolic (congestive) heart failure: Secondary | ICD-10-CM | POA: Diagnosis not present

## 2024-11-14 DIAGNOSIS — J449 Chronic obstructive pulmonary disease, unspecified: Secondary | ICD-10-CM | POA: Diagnosis not present

## 2024-11-14 DIAGNOSIS — I471 Supraventricular tachycardia, unspecified: Secondary | ICD-10-CM | POA: Diagnosis not present

## 2024-11-14 DIAGNOSIS — I480 Paroxysmal atrial fibrillation: Secondary | ICD-10-CM | POA: Diagnosis not present

## 2024-11-14 HISTORY — PX: CATARACT EXTRACTION W/PHACO: SHX586

## 2024-11-14 SURGERY — PHACOEMULSIFICATION, CATARACT, WITH IOL INSERTION
Anesthesia: Topical | Site: Eye | Laterality: Right

## 2024-11-14 MED ORDER — CYCLOPENTOLATE HCL 2 % OP SOLN
OPHTHALMIC | Status: AC
  Filled 2024-11-14: qty 2

## 2024-11-14 MED ORDER — TETRACAINE HCL 0.5 % OP SOLN
1.0000 [drp] | OPHTHALMIC | Status: DC | PRN
  Administered 2024-11-14 (×3): 1 [drp] via OPHTHALMIC

## 2024-11-14 MED ORDER — FENTANYL CITRATE (PF) 100 MCG/2ML IJ SOLN
INTRAMUSCULAR | Status: AC
  Filled 2024-11-14: qty 2

## 2024-11-14 MED ORDER — TETRACAINE HCL 0.5 % OP SOLN
OPHTHALMIC | Status: AC
  Filled 2024-11-14: qty 4

## 2024-11-14 MED ORDER — SIGHTPATH DOSE#1 NA HYALUR & NA CHOND-NA HYALUR IO KIT
PACK | INTRAOCULAR | Status: DC | PRN
  Administered 2024-11-14: 1 via OPHTHALMIC

## 2024-11-14 MED ORDER — PHENYLEPHRINE HCL 10 % OP SOLN
OPHTHALMIC | Status: AC
  Filled 2024-11-14: qty 5

## 2024-11-14 MED ORDER — PHENYLEPHRINE HCL 10 % OP SOLN
1.0000 [drp] | OPHTHALMIC | Status: AC
  Administered 2024-11-14 (×3): 1 [drp] via OPHTHALMIC

## 2024-11-14 MED ORDER — SIGHTPATH DOSE#1 BSS IO SOLN
INTRAOCULAR | Status: DC | PRN
  Administered 2024-11-14: 102 mL via OPHTHALMIC

## 2024-11-14 MED ORDER — MOXIFLOXACIN HCL 0.5 % OP SOLN
OPHTHALMIC | Status: DC | PRN
  Administered 2024-11-14: .2 mL via OPHTHALMIC

## 2024-11-14 MED ORDER — FENTANYL CITRATE (PF) 100 MCG/2ML IJ SOLN
INTRAMUSCULAR | Status: DC | PRN
  Administered 2024-11-14: 50 ug via INTRAVENOUS

## 2024-11-14 MED ORDER — SIGHTPATH DOSE#1 BSS IO SOLN
INTRAOCULAR | Status: DC | PRN
  Administered 2024-11-14: 15 mL via INTRAOCULAR

## 2024-11-14 MED ORDER — BRIMONIDINE TARTRATE-TIMOLOL 0.2-0.5 % OP SOLN
OPHTHALMIC | Status: DC | PRN
  Administered 2024-11-14: 1 [drp] via OPHTHALMIC

## 2024-11-14 MED ORDER — CYCLOPENTOLATE HCL 2 % OP SOLN
1.0000 [drp] | OPHTHALMIC | Status: AC
  Administered 2024-11-14 (×3): 1 [drp] via OPHTHALMIC

## 2024-11-14 MED ORDER — LIDOCAINE HCL (PF) 2 % IJ SOLN
INTRAOCULAR | Status: DC | PRN
  Administered 2024-11-14: 1 mL via INTRAOCULAR

## 2024-11-14 SURGICAL SUPPLY — 9 items
DISSECTOR HYDRO NUCLEUS 50X22 (MISCELLANEOUS) ×1 IMPLANT
DRSG TEGADERM 2-3/8X2-3/4 SM (GAUZE/BANDAGES/DRESSINGS) ×1 IMPLANT
FEE CATARACT SUITE SIGHTPATH (MISCELLANEOUS) ×1 IMPLANT
GLOVE BIOGEL PI IND STRL 8 (GLOVE) ×1 IMPLANT
GLOVE SURG LX STRL 7.5 STRW (GLOVE) ×1 IMPLANT
GLOVE SURG SYN 6.5 PF PI BL (GLOVE) ×1 IMPLANT
LENS IOL TECNIS MONO 18.5 (Intraocular Lens) IMPLANT
NDL FILTER BLUNT 18X1 1/2 (NEEDLE) ×1 IMPLANT
SYR 3ML LL SCALE MARK (SYRINGE) ×1 IMPLANT

## 2024-11-14 NOTE — Transfer of Care (Signed)
 Immediate Anesthesia Transfer of Care Note  Patient: Todd Gonzales  Procedure(s) Performed: PHACOEMULSIFICATION, CATARACT, WITH IOL INSERTION 22.12 02:02.5 (Right: Eye)  Patient Location: PACU  Anesthesia Type: MAC  Level of Consciousness: awake, alert  and patient cooperative  Airway and Oxygen Therapy: Patient Spontanous Breathing and Patient connected to supplemental oxygen  Post-op Assessment: Post-op Vital signs reviewed, Patient's Cardiovascular Status Stable, Respiratory Function Stable, Patent Airway and No signs of Nausea or vomiting  Post-op Vital Signs: Reviewed and stable  Complications: No notable events documented.

## 2024-11-14 NOTE — Discharge Instructions (Signed)

## 2024-11-14 NOTE — Anesthesia Preprocedure Evaluation (Addendum)
 Anesthesia Evaluation  Patient identified by MRN, date of birth, ID band Patient awake    Reviewed: Allergy & Precautions, NPO status , Patient's Chart, lab work & pertinent test results  History of Anesthesia Complications Negative for: history of anesthetic complications  Airway Mallampati: IV  TM Distance: >3 FB Neck ROM: full    Dental  (+) Partial Lower, Partial Upper   Pulmonary shortness of breath and with exertion, sleep apnea , COPD   Pulmonary exam normal  + decreased breath sounds      Cardiovascular Exercise Tolerance: Poor hypertension, Pt. on medications + CAD and +CHF  + dysrhythmias Atrial Fibrillation and Supra Ventricular Tachycardia  Rhythm:Irregular Rate:Normal     Neuro/Psych  Neuromuscular disease  negative psych ROS   GI/Hepatic negative GI ROS, Neg liver ROS, hiatal hernia,,,  Endo/Other  negative endocrine ROS  Class 3 obesity  Renal/GU negative Renal ROS  negative genitourinary   Musculoskeletal  (+) Arthritis ,    Abdominal  (+) + obese  Peds negative pediatric ROS (+)  Hematology negative hematology ROS (+)   Anesthesia Other Findings Past Medical History: 03/28/2023: Actinic keratosis     Comment:  glabella, needs LN2 No date: Aortic atherosclerosis (HCC)     Comment:  a. Noted on CT 02/2023. No date: Arthritis 03/28/2023: Basal cell carcinoma     Comment:  right paranasal, no tx, cleared with bx No date: BPH (benign prostatic hyperplasia)     Comment:  nocturia x4 at baseline No date: Coronary artery calcification seen on CT scan No date: H/O hiatal hernia No date: Hemorrhoids No date: History of echocardiogram     Comment:  a. 04/2023 Echo: EF 60-65%, no rwma, mild LVH, nl RV fxn,              triv MR. No date: Hyperlipidemia No date: Hypertension     Comment:  Dr Tisovec- LOV with clearance 3/13 on chart No date: IGT (impaired glucose tolerance)     Comment:  last AIC  5.5- diet controlled- per office note 3/13 04/14/2023: Paroxysmal atrial fibrillation (HCC)     Comment:  a. 04/2023 Admitted with A-fib RVR.  Normal echo.                Discharged on amiodarone ; b. 05/2023 Zio: Predominantly               sinus rhythm at 66 (50-88).  First-degree AV block.  4               beats of NSVT.  No sustained arrhythmias.  Amiodarone                discontinued. 02/10/2005: SCC (squamous cell carcinoma)     Comment:  scc in situ-left forearm. 02/10/2005: SCCA (squamous cell carcinoma) of skin     Comment:  Right Outer Eye (in situ) (curet and excision) 02/13/2006: SCCA (squamous cell carcinoma) of skin     Comment:  Right Ear (well diff) (curet and 5FU) 04/05/2006: SCCA (squamous cell carcinoma) of skin     Comment:  Left Ear Bowl (in situ) (tx p bx) 07/06/2010: SCCA (squamous cell carcinoma) of skin     Comment:  Right Cheek (in situ) (curet and zyclara) 04/27/2009: SCCA (squamous cell carcinoma) of skin     Comment:  Left Upper Forearm (in situ) (curet 5FU) 08/28/2014: SCCA (squamous cell carcinoma) of skin     Comment:  Right Cheek (well diff) 03/21/2016: SCCA (squamous cell carcinoma) of skin  Comment:  Left Jawline Ant (well diff) (curet and 5FU) 01/19/2017: SCCA (squamous cell carcinoma) of skin     Comment:  Right Hand (in situ) (tx p bx) 03/04/1840: SCCA (squamous cell carcinoma) of skin     Comment:  Left Jawline Inf (in situ) (curet and 5FU) 12/23/2020: SCCA (squamous cell carcinoma) of skin     Comment:  Left Forehead (well diff) (tx p bx) 12/23/2020: SCCA (squamous cell carcinoma) of skin     Comment:  Left Submandibular Area (in situ) (tx p bx) No date: Seasonal allergies No date: Spinal stenosis 08/31/2020: Squamous cell carcinoma of skin     Comment:  in situ-left jawline cheek 04/27/2021: Squamous cell carcinoma of skin     Comment:  infil- left nasal sidewall([facility]) 03/28/2023: Squamous cell carcinoma of skin     Comment:  Left medial  cheek above nasolabial, not treated, but               cleared with bx 08/16/2023: Squamous cell carcinoma of skin     Comment:  Left lateral upper pretibia, [facility] 04/27/2021: Squamous cell carcinoma of skin of face     Comment:  infil- Left Temporal scalp ([facility]HS No date: UTI (lower urinary tract infection)     Comment:  required admission to [facility]  Past Surgical History: 2008: BACK SURGERY     Comment:  Laminectomy L4-5 No date: JOINT REPLACEMENT; Bilateral     Comment:  bil No date: KNEE ARTHROSCOPY     Comment:  right 11/20/2018: LUMBAR LAMINECTOMY/DECOMPRESSION MICRODISCECTOMY;  Bilateral     Comment:  Procedure: Bilateral Thoracic Twelve to Lumbar One               Laminectomy;  Surgeon: Colon Shove, MD;  Location: [facility]               OR;  Service: Neurosurgery;  Laterality: Bilateral;                posterior 03/20/2012: TOTAL HIP ARTHROPLASTY     Comment:  Procedure: TOTAL HIP ARTHROPLASTY ANTERIOR APPROACH;                Surgeon: Donnice JONETTA Car, MD;  Location: [facility] ORS;  Service:              Orthopedics;  Laterality: Left;  BMI    Body Mass Index: 34.44 kg/m      Reproductive/Obstetrics negative OB ROS                              Anesthesia Physical Anesthesia Plan  ASA: 4  Anesthesia Plan: MAC   Post-op Pain Management:    Induction:   PONV Risk Score and Plan:   Airway Management Planned: Natural Airway and Nasal Cannula  Additional Equipment:   Intra-op Plan:   Post-operative Plan:   Informed Consent: I have reviewed the patients History and Physical, chart, labs and discussed the procedure including the risks, benefits and alternatives for the proposed anesthesia with the patient or authorized representative who has indicated his/her understanding and acceptance.     Dental Advisory Given  Plan Discussed with: CRNA  Anesthesia Plan Comments: (Patient consented for risks of anesthesia including but not limited to:  -  adverse reactions to medications - risk of airway placement if required - damage to eyes, teeth, lips or other oral mucosa - nerve damage due to positioning  - sore throat or hoarseness -  Damage to heart, brain, nerves, lungs, other parts of body or loss of life  Patient voiced understanding and assent.)        Anesthesia Quick Evaluation

## 2024-11-14 NOTE — Anesthesia Postprocedure Evaluation (Signed)
 Anesthesia Post Note  Patient: Todd Gonzales  Procedure(s) Performed: PHACOEMULSIFICATION, CATARACT, WITH IOL INSERTION 22.12 02:02.5 (Right: Eye)  Patient location during evaluation: PACU Anesthesia Type: MAC Level of consciousness: awake and alert Pain management: pain level controlled Vital Signs Assessment: post-procedure vital signs reviewed and stable Respiratory status: spontaneous breathing, nonlabored ventilation, respiratory function stable and patient connected to nasal cannula oxygen Cardiovascular status: stable and blood pressure returned to baseline Postop Assessment: no apparent nausea or vomiting Anesthetic complications: no   No notable events documented.   Last Vitals:  Vitals:   11/14/24 1232 11/14/24 1237  BP: (!) 151/72 (!) 146/76  Pulse: 61 61  Resp: 19 16  Temp: 36.7 C 36.7 C  SpO2: 98% 97%    Last Pain:  Vitals:   11/14/24 1237  TempSrc:   PainSc: 0-No pain                 [name]

## 2024-11-14 NOTE — Op Note (Signed)
 OPERATIVE NOTE  KEEDAN SAMPLE [id number] 11/14/2024   PREOPERATIVE DIAGNOSIS: Nuclear sclerotic cataract right eye. H25.11   POSTOPERATIVE DIAGNOSIS: Nuclear sclerotic cataract right eye. H25.11   PROCEDURE:  Phacoemulsification with posterior chamber intraocular lens placement of the right eye  Ultrasound time: Procedures: PHACOEMULSIFICATION, CATARACT, WITH IOL INSERTION 22.12 02:02.5 (Right)  LENS:   Implant Name Type Inv. Item Serial No. Manufacturer Lot No. LRB No. Used Action  LENS IOL TECNIS MONO 18.5 - [id number] Intraocular Lens LENS IOL TECNIS MONO 18.5 [id number] [employer]  Right 1 Implanted      SURGEON:  Feliciano HERO. Enola, MD   ANESTHESIA:  Topical with tetracaine  drops, augmented with 1% preservative-free intracameral lidocaine .   COMPLICATIONS:  None.   DESCRIPTION OF PROCEDURE:  The patient was identified in the holding room and transported to the operating room and placed in the supine position under the operating microscope.  The right eye was identified as the operative eye, which was prepped and draped in the usual sterile ophthalmic fashion.   A 1 millimeter clear-corneal paracentesis was made superotemporally. Preservative-free 1% lidocaine  mixed with 1:1,000 bisulfite-free aqueous solution of epinephrine  was injected into the anterior chamber. The anterior chamber was then filled with Viscoat viscoelastic. A 2.4 millimeter keratome was used to make a clear-corneal incision inferotemporally. A curvilinear capsulorrhexis was made with a cystotome and capsulorrhexis forceps. Balanced salt solution was used to hydrodissect and hydrodelineate the nucleus. Phacoemulsification was then used to remove the lens nucleus and epinucleus. The remaining cortex was then removed using the irrigation and aspiration handpiece. Provisc was then placed into the capsular bag to distend it for lens placement. A +18.50 D [id number] intraocular lens was then injected into the capsular bag. The  remaining viscoelastic was aspirated.   Wounds were hydrated with balanced salt solution.  The anterior chamber was inflated to a physiologic pressure with balanced salt solution.  No wound leaks were noted. Moxifloxacin was injected intracamerally.  Timolol and Brimonidine drops were applied to the eye.  The patient was taken to the recovery room in stable condition without complications of anesthesia or surgery.  Feliciano Hugger [place] 11/14/2024, 12:31 PM

## 2024-11-14 NOTE — H&P (Signed)
 [facility]   Primary Care Physician:  Cleatus Arlyss RAMAN, MD Ophthalmologist: Dr. Feliciano Ober  Pre-Procedure History & Physical: HPI:  Todd Gonzales is a 83 y.o. male here for cataract surgery.   Past Medical History:  Diagnosis Date   Actinic keratosis 03/28/2023   glabella, needs LN2   Acute hypoxemic respiratory failure (HCC)    Aortic atherosclerosis    a. Noted on CT 02/2023.   Arthritis    Basal cell carcinoma 03/28/2023   right paranasal, no tx, cleared with bx   BPH (benign prostatic hyperplasia)    nocturia x4 at baseline   CAD (coronary artery disease)    a. 08/2023 Cor CTA: LM nl, LAD 25-49, LCX <25, RCA 25-49. Ca2+ = 409 (43rd %'ile).   Chronic heart failure with preserved ejection fraction (HFpEF) (HCC)    a. 04/2023 Echo: EF 60-65%, no rwma, mild LVH, nl RV fxn, triv MR; b. 12/2023 TEE: EF 60-65%, nl RV size/fxn, mildly dil LA, no LA/LAA thrombus, mild-mod MR, mild AI; c. 07/2014 Echo: EF 60-65%, no rwma, GrI DD, nl RV fxn.   COPD (chronic obstructive pulmonary disease) (HCC)    Grade I diastolic dysfunction    H/O hiatal hernia    Hemorrhoids    History of cardioversion    Hyperlipidemia    Hypertension    Dr Tisovec- LOV with clearance 3/13 on chart   IGT (impaired glucose tolerance)    last AIC 5.5- diet controlled- per office note 3/13   Incomplete right bundle branch block (RBBB)    Mitral regurgitation    a. 12/2023 TEE: mild to mod MR in setting of afib.   Obesity (BMI 30-39.9)    Obstructive sleep apnea    OSA on CPAP    OSA treated with BiPAP    Paroxysmal atrial fibrillation (HCC) 04/14/2023   a. 04/2023 Admitted with A-fib RVR.  Normal echo.  Discharged on amiodarone ; b. 05/2023 Zio: Predominantly sinus rhythm at 66 (50-88).  First-degree AV block.  4 beats of NSVT.  No sustained arrhythmias.  Amiodarone  discontinued; c. 12/2023 recurrent Afib->amio->TEE/DCCV.   Paroxysmal atrial fibrillation (HCC)    SCC (squamous cell carcinoma) 02/10/2005   scc  in situ-left forearm.   SCC (squamous cell carcinoma) 02/12/2024   L lateral upper pretibia, EDC   SCCA (squamous cell carcinoma) of skin 02/10/2005   Right Outer Eye (in situ) (curet and excision)   SCCA (squamous cell carcinoma) of skin 02/13/2006   Right Ear (well diff) (curet and 5FU)   SCCA (squamous cell carcinoma) of skin 04/05/2006   Left Ear Bowl (in situ) (tx p bx)   SCCA (squamous cell carcinoma) of skin 07/06/2010   Right Cheek (in situ) (curet and zyclara)   SCCA (squamous cell carcinoma) of skin 04/27/2009   Left Upper Forearm (in situ) (curet 5FU)   SCCA (squamous cell carcinoma) of skin 08/28/2014   Right Cheek (well diff)   SCCA (squamous cell carcinoma) of skin 03/21/2016   Left Jawline Ant (well diff) (curet and 5FU)   SCCA (squamous cell carcinoma) of skin 01/19/2017   Right Hand (in situ) (tx p bx)   SCCA (squamous cell carcinoma) of skin 03/04/1840   Left Jawline Inf (in situ) (curet and 5FU)   SCCA (squamous cell carcinoma) of skin 12/23/2020   Left Forehead (well diff) (tx p bx)   SCCA (squamous cell carcinoma) of skin 12/23/2020   Left Submandibular Area (in situ) (tx p bx)   Seasonal allergies  Spinal stenosis    Squamous cell carcinoma of skin 08/31/2020   in situ-left jawline cheek   Squamous cell carcinoma of skin 04/27/2021   infil- left nasal sidewall(MOHS)   Squamous cell carcinoma of skin 03/28/2023   Left medial cheek above nasolabial, not treated, but cleared with bx   Squamous cell carcinoma of skin 08/16/2023   Left lateral upper pretibia, EDC   Squamous cell carcinoma of skin 05/28/2024   SCC IS,  Left Upper Temple, [facility] 06/10/24   Squamous cell carcinoma of skin of face 04/27/2021   infil- Left Temporal scalp (MOHS   UTI (lower urinary tract infection)    required admission to [facility]    Past Surgical History:  Procedure Laterality Date   BACK SURGERY  2008   Laminectomy L4-5   CARDIOVERSION N/A 12/22/2023   Procedure: CARDIOVERSION;   Surgeon: Darliss Rogue, MD;  Location: [facility] ORS;  Service: Cardiovascular;  Laterality: N/A;   CARDIOVERSION N/A 08/20/2024   Procedure: CARDIOVERSION;  Surgeon: Argentina Clap, MD;  Location: [facility] ORS;  Service: Cardiovascular;  Laterality: N/A;   JOINT REPLACEMENT Bilateral    bil   KNEE ARTHROSCOPY     right   LUMBAR LAMINECTOMY/DECOMPRESSION MICRODISCECTOMY Bilateral 11/20/2018   Procedure: Bilateral Thoracic Twelve to Lumbar One Laminectomy;  Surgeon: Colon Shove, MD;  Location[facility];  Service: Neurosurgery;  Laterality: Bilateral;  posterior   TEE WITHOUT CARDIOVERSION N/A 12/22/2023   Procedure: TRANSESOPHAGEAL ECHOCARDIOGRAM (TEE);  Surgeon: Darliss Rogue, MD;  Location: [facility] ORS;  Service: Cardiovascular;  Laterality: N/A;   TEE WITHOUT CARDIOVERSION N/A 08/20/2024   Procedure: ECHOCARDIOGRAM, TRANSESOPHAGEAL;  Surgeon: Argentina Clap, MD;  Location: [facility] ORS;  Service: Cardiovascular;  Laterality: N/A;   TOTAL HIP ARTHROPLASTY  03/20/2012   Procedure: TOTAL HIP ARTHROPLASTY ANTERIOR APPROACH;  Surgeon: Donnice JONETTA Car, MD;  Location: [facility] ORS;  Service: Orthopedics;  Laterality: Left;    Prior to Admission medications  Medication Sig Start Date End Date Taking? Authorizing Provider  acetaminophen  (TYLENOL ) 650 MG CR tablet Take 1,300 mg by mouth 2 (two) times daily as needed for pain.   Yes [provider]  amiodarone  (PACERONE ) 200 MG tablet TAKE ONE TABLET (200 MG TOTAL) BY MOUTH DAILY. 10/01/24  Yes Vivienne Lonni Ingle, NP  apixaban  (ELIQUIS ) 5 MG TABS tablet TAKE ONE TABLET BY MOUTH TWICE A DAY 11/08/24  Yes Cleatus Arlyss RAMAN, MD  cyanocobalamin  (VITAMIN B12) 1000 MCG/ML injection Inject 1 mL (1,000 mcg total) into the muscle every 30 (thirty) days. 01/26/23  Yes Cleatus Arlyss RAMAN, MD  diclofenac  Sodium (VOLTAREN ) 1 % GEL Apply 1 Application topically every 6 (six) hours as needed (pain). 09/05/16  Yes [provider]  fluorouracil  (EFUDEX ) 5 % cream  Apply topically 2 (two) times daily. Bid 5-7 days to temples, cheeks, forehead and nose 02/12/24  Yes Jackquline Sawyer, MD  furosemide  (LASIX ) 20 MG tablet Take 1 tablet (20 mg total) by mouth daily as needed for fluid. 10/22/24 01/20/25 Yes Cleatus Arlyss RAMAN, MD  gabapentin  (NEURONTIN ) 300 MG capsule TAKE ONE CAPSULE BY MOUTH ONCE DAILY 10/04/24  Yes Cleatus Arlyss RAMAN, MD  polyethylene glycol powder (GLYCOLAX /MIRALAX ) 17 GM/SCOOP powder Take 17 g by mouth 2 (two) times daily as needed.   Yes [provider]  tamsulosin  (FLOMAX ) 0.4 MG CAPS capsule Take 1 capsule (0.4 mg total) by mouth daily. 08/14/24  Yes Cleatus Arlyss RAMAN, MD  triamcinolone  cream (KENALOG ) 0.1 % Apply 1-2 times daily to aa, lower legs for up to  2 weeks. Avoid applying to face, groin, and axilla. Use as directed. Long-term use can cause thinning of the skin. 06/10/24  Yes Jackquline Sawyer, MD  ondansetron  (ZOFRAN ) 4 MG tablet Take 1 tablet (4 mg total) by mouth daily as needed for nausea or vomiting. Patient not taking: Reported on 11/13/2024 10/22/24   Cleatus Arlyss RAMAN, MD    Allergies as of 11/05/2024 - Review Complete 10/30/2024  Allergen Reaction Noted   Codeine Other (See Comments) 08/23/2016   Lipitor [atorvastatin ] Other (See Comments) 04/14/2023   Bactrim [sulfamethoxazole-trimethoprim] Rash 09/10/2021    Family History  Problem Relation Age of Onset   Arthritis Mother    Lung cancer Father    Asthma Father    Colon cancer Neg Hx    Prostate cancer Neg Hx     Social History   Socioeconomic History   Marital status: Married    Spouse name: Not on file   Number of children: 2   Years of education: Not on file   Highest education level: Bachelor's degree (e.g., BA, AB, BS)  Occupational History   Occupation: retired  Tobacco Use   Smoking status: Never   Smokeless tobacco: Never  Vaping Use   Vaping status: Never Used  Substance and Sexual Activity   Alcohol use: Yes    Alcohol/week: 2.0 standard drinks  of alcohol    Types: 2 Glasses of wine per week    Comment: socially, wine   Drug use: No   Sexual activity: Not on file  Other Topics Concern   Not on file  Social History Narrative   Plays bridge   [employer] [facility] [employer]     2 kids, one local.     [employer] grad.     Married 1962   Prev in engineer, maintenance (it), retired.     Social Drivers of Health   Tobacco Use: Low Risk (11/13/2024)   Patient History    Smoking Tobacco Use: Never    Smokeless Tobacco Use: Never    Passive Exposure: Not on file  Financial Resource Strain: Low Risk (08/06/2024)   Overall Financial Resource Strain (CARDIA)    Difficulty of Paying Living Expenses: Not hard at all  Food Insecurity: No Food Insecurity (09/16/2024)   Epic    Worried About Programme Researcher, Broadcasting/film/video in the Last Year: Never true    Ran Out of Food in the Last Year: Never true  Transportation Needs: No Transportation Needs (09/16/2024)   Epic    Lack of Transportation (Medical): No    Lack of Transportation (Non-Medical): No  Physical Activity: Insufficiently Active (08/06/2024)   Exercise Vital Sign    Days of Exercise per Week: 2 days    Minutes of Exercise per Session: 30 min  Stress: No Stress Concern Present (08/06/2024)   [employer] of Occupational Health - Occupational Stress Questionnaire    Feeling of Stress: Only a little  Social Connections: Moderately Isolated (09/12/2024)   Social Connection and Isolation Panel    Frequency of Communication with Friends and Family: Three times a week    Frequency of Social Gatherings with Friends and Family: Twice a week    Attends Religious Services: Never    Database Administrator or Organizations: No    Attends Banker Meetings: Never    Marital Status: Married  Catering Manager Violence: Not At Risk (09/16/2024)   Epic    Fear of Current or Ex-Partner: No    Emotionally Abused: No  Physically Abused: No    Sexually Abused: No  Depression (PHQ2-9): Low Risk (10/22/2024)    Depression (PHQ2-9)    PHQ-2 Score: 0  Alcohol Screen: Low Risk (08/06/2024)   Alcohol Screen    Last Alcohol Screening Score (AUDIT): 4  Housing: Unknown (09/16/2024)   Epic    Unable to Pay for Housing in the Last Year: No    Number of Times Moved in the Last Year: Not on file    Homeless in the Last Year: No  Utilities: Not At Risk (09/16/2024)   Epic    Threatened with loss of utilities: No  Health Literacy: Adequate Health Literacy (08/06/2024)   B1300 Health Literacy    Frequency of need for help with medical instructions: Never    Review of Systems: See HPI, otherwise negative ROS  Physical Exam: There were no vitals taken for this visit. General:   Alert, cooperative in NAD Head:  Normocephalic and atraumatic. Respiratory:  Normal work of breathing. Cardiovascular:  RRR  Impression/Plan: Todd Gonzales is here for cataract surgery.  Risks, benefits, limitations, and alternatives regarding cataract surgery have been reviewed with the patient.  Questions have been answered.  All parties agreeable.   Feliciano Bryan Ober, MD  11/14/2024, 7:16 AM

## 2024-11-15 ENCOUNTER — Telehealth: Payer: Self-pay | Admitting: Family Medicine

## 2024-11-15 NOTE — Telephone Encounter (Signed)
 Okay for verbal orders.

## 2024-11-15 NOTE — Telephone Encounter (Signed)
 Copied from CRM #[id number]. Topic: Clinical - Home Health Verbal Orders >> Nov 14, 2024  4:49 PM Jayma L wrote: Caller/AgencyBETHA jori Rushing Number: [phone] Service Requested: Occupational Therapy Frequency: move recert. from this week to next week - for 1 week , 6  Any new concerns about the patient? No

## 2024-11-15 NOTE — Telephone Encounter (Signed)
 Please give the order.  Thanks.

## 2024-11-18 NOTE — Telephone Encounter (Signed)
 Detailed message left on identified voicemail with verbal orders and request to call if any questions.

## 2024-11-18 NOTE — Telephone Encounter (Signed)
 Left message Jori to return call to office. Ok to capital one below.

## 2024-11-19 ENCOUNTER — Ambulatory Visit: Admitting: Dermatology

## 2024-11-19 DIAGNOSIS — Z86007 Personal history of in-situ neoplasm of skin: Secondary | ICD-10-CM

## 2024-11-19 DIAGNOSIS — L570 Actinic keratosis: Secondary | ICD-10-CM

## 2024-11-19 DIAGNOSIS — W908XXA Exposure to other nonionizing radiation, initial encounter: Secondary | ICD-10-CM | POA: Diagnosis not present

## 2024-11-19 DIAGNOSIS — L578 Other skin changes due to chronic exposure to nonionizing radiation: Secondary | ICD-10-CM

## 2024-11-19 DIAGNOSIS — Z7189 Other specified counseling: Secondary | ICD-10-CM | POA: Diagnosis not present

## 2024-11-19 NOTE — Progress Notes (Signed)
 Follow-Up Visit   Subjective  Todd Gonzales is a 83 y.o. male who presents for the following: Growth on the upper lip x 6 months, hits with shaving and grows back. Also a spot on the left lower cheek gets irritated.    The following portions of the chart were reviewed this encounter and updated as appropriate: medications, allergies, medical history  Review of Systems:  No other skin or systemic complaints except as noted in HPI or Assessment and Plan.  Objective  Well appearing patient in no apparent distress; mood and affect are within normal limits.  A focused examination was performed of the following areas: Face, upper lip  Relevant physical exam findings are noted in the Assessment and Plan.  upper lip at vermilion edge x 1, left lower cheek x 1, L med cheek x 1, mid forehead x 1 (4) Keratotic macules/papules L upper temple x 1 Pink macule anterior to SCCIS scar  Assessment & Plan  ACTINIC DAMAGE WITH PRECANCEROUS ACTINIC KERATOSES Counseling for Topical Chemotherapy Management: Patient exhibits: - Severe, confluent actinic changes with pre-cancerous actinic keratoses that is secondary to cumulative UV radiation exposure over time - Condition that is severe; chronic, not at goal. - diffuse scaly erythematous macules and papules with underlying dyspigmentation - Discussed Prescription Field Treatment topical Chemotherapy for Severe, Chronic Confluent Actinic Changes with Pre-Cancerous Actinic Keratoses Field treatment involves treatment of an entire area of skin that has confluent Actinic Changes (Sun/ Ultraviolet light damage) and PreCancerous Actinic Keratoses by method of PhotoDynamic Therapy (PDT) and/or prescription Topical Chemotherapy agents such as 5-fluorouracil , 5-fluorouracil /calcipotriene , and/or imiquimod.  The purpose is to decrease the number of clinically evident and subclinical PreCancerous lesions to prevent progression to development of skin cancer by  chemically destroying early precancer changes that may or may not be visible.  It has been shown to reduce the risk of developing skin cancer in the treated area. As a result of treatment, redness, scaling, crusting, and open sores may occur during treatment course. One or more than one of these methods may be used and may have to be used several times to control, suppress and eliminate the PreCancerous changes. Discussed treatment course, expected reaction, and possible side effects. - Recommend daily broad spectrum sunscreen SPF 30+ to sun-exposed areas, reapply every 2 hours as needed.  - Staying in the shade or wearing long sleeves, sun glasses (UVA+UVB protection) and wide brim hats (4-inch brim around the entire circumference of the hat) are also recommended. - Call for new or changing lesions. - Recommend red light PDT with debridement to face.  Patient will schedule after the holidays.  HISTORY OF SQUAMOUS CELL CARCINOMA IN SITU OF THE SKIN L upper temple, 06/2024 - No evidence of recurrence today - Recommend regular full body skin exams - Recommend daily broad spectrum sunscreen SPF 30+ to sun-exposed areas, reapply every 2 hours as needed.  - Call if any new or changing lesions are noted between office visits  HYPERTROPHIC ACTINIC KERATOSIS (4) upper lip at vermilion edge x 1, left lower cheek x 1, L med cheek x 1, mid forehead x 1 (4) vs Inflamed SKs.  Actinic keratoses are precancerous spots that appear secondary to cumulative UV radiation exposure/sun exposure over time. They are chronic with expected duration over 1 year. A portion of actinic keratoses will progress to squamous cell carcinoma of the skin. It is not possible to reliably predict which spots will progress to skin cancer and so treatment is recommended  to prevent development of skin cancer.  Recommend daily broad spectrum sunscreen SPF 30+ to sun-exposed areas, reapply every 2 hours as needed.  Recommend staying in the  shade or wearing long sleeves, sun glasses (UVA+UVB protection) and wide brim hats (4-inch brim around the entire circumference of the hat). Call for new or changing lesions. - Destruction of lesion - upper lip at vermilion edge x 1, left lower cheek x 1, L med cheek x 1, mid forehead x 1 (4)  Destruction method: cryotherapy   Informed consent: discussed and consent obtained   Lesion destroyed using liquid nitrogen: Yes   Region frozen until ice ball extended beyond lesion: Yes   Outcome: patient tolerated procedure well with no complications   Post-procedure details: wound care instructions given   Additional details:  Prior to procedure, discussed risks of blister formation, small wound, skin dyspigmentation, or rare scar following cryotherapy. Recommend Vaseline ointment to treated areas while healing.   AK (ACTINIC KERATOSIS) L upper temple x 1 Actinic keratoses are precancerous spots that appear secondary to cumulative UV radiation exposure/sun exposure over time. They are chronic with expected duration over 1 year. A portion of actinic keratoses will progress to squamous cell carcinoma of the skin. It is not possible to reliably predict which spots will progress to skin cancer and so treatment is recommended to prevent development of skin cancer.  Recommend daily broad spectrum sunscreen SPF 30+ to sun-exposed areas, reapply every 2 hours as needed.  Recommend staying in the shade or wearing long sleeves, sun glasses (UVA+UVB protection) and wide brim hats (4-inch brim around the entire circumference of the hat). Call for new or changing lesions. - Destruction of lesion - L upper temple x 1  Destruction method: cryotherapy   Informed consent: discussed and consent obtained   Lesion destroyed using liquid nitrogen: Yes   Region frozen until ice ball extended beyond lesion: Yes   Outcome: patient tolerated procedure well with no complications   Post-procedure details: wound care  instructions given   Additional details:  Prior to procedure, discussed risks of blister formation, small wound, skin dyspigmentation, or rare scar following cryotherapy. Recommend Vaseline ointment to treated areas while healing.     Return for red light w/debridement to the face. Also 3 mo AK f/u Dr Jackquline.  IAndrea Kerns, CMA, am acting as scribe for Rexene Jackquline, MD   Documentation: I have reviewed the above documentation for accuracy and completeness, and I agree with the above.  Rexene Jackquline, MD

## 2024-11-19 NOTE — Patient Instructions (Signed)
 Photodynamic Therapy- Blue or Red Light Therapy  Actinic keratoses are the dry, red scaly spots on the skin caused by sun damage. A portion of these spots can turn into skin cancer with time, and treating them can help prevent development of skin cancer.   Treatment of these spots requires removal of the defective skin cells. There are various ways to remove actinic keratoses, including freezing with liquid nitrogen, treatment with creams, or treatment with a blue light procedure in the office.   Photodynamic Therapy (PDT), also known as blue or red light therapy is an in office procedure used to treat actinic keratoses. It works by targeting precancerous cells. After treatment, these cells peel off and are replaced by healthy ones.   For your phototherapy appointment, you will have two appointments on the day of your treatment. The first appointment will be to apply a cream to the treatment area. You will leave this cream on for 1-2 hours depending on the area being treated. The second appointment will be to shine a blue or red light on the area for 16-20 minutes to kill off the precancer cells. It is common to experience a burning sensation during the treatment.  After your treatment, it will be important to keep the treated areas of skin out of the sun completely for 48-72 hours (2-3 days) to prevent having a reaction.   Common side effects include: - Burning or stinging, which may be severe and can last up to 24-72 hours after your treatment - Scaling and crusting which may last up to 2 weeks - Redness, swelling and/or peeling which can last up to 4 weeks  To Care for Your Skin After PDT/Blue/Red Light Therapy: - Wash with soap, water and shampoo as normal. - If needed, you can use cold compresses (e.g. ice packs) for comfort - If okay with your primary care doctor, you may use analgesics such as acetaminophen (tylenol) every 4-6 hours, not to exceed recommended dose - You may apply  Cerave Healing Ointment, Vaseline or Aquaphor as needed - If you have a lot of swelling you may take a Benadryl to help with this (this may cause drowsiness), not to exceed recommended dose. This may increase the risk of falls in people over 65 and may slow reaction time while driving, so it is not recommended to take before driving or operating machinery. - Sun Precautions - Wear a wide brim hat for the next week if outside  - Wear a sunblock with zinc or titanium dioxide at least SPF 50 daily  If you have any questions or concerns, please call the office and ask to speak with a nurse.   --------------------------------------------------------------------------------------------------------------    Due to recent changes in healthcare laws, you may see results of your pathology and/or laboratory studies on MyChart before the doctors have had a chance to review them. We understand that in some cases there may be results that are confusing or concerning to you. Please understand that not all results are received at the same time and often the doctors may need to interpret multiple results in order to provide you with the best plan of care or course of treatment. Therefore, we ask that you please give us  2 business days to thoroughly review all your results before contacting the office for clarification. Should we see a critical lab result, you will be contacted sooner.   If You Need Anything After Your Visit  If you have any questions or concerns for your doctor,  please call our main line at [phone] and press option 4 to reach your doctor's medical assistant. If no one answers, please leave a voicemail as directed and we will return your call as soon as possible. Messages left after 4 pm will be answered the following business day.   You may also send us  a message via MyChart. We typically respond to MyChart messages within 1-2 business days.  For prescription refills, please ask your pharmacy  to contact our office. Our fax number is [phone].  If you have an urgent issue when the clinic is closed that cannot wait until the next business day, you can page your doctor at the number below.    Please note that while we do our best to be available for urgent issues outside of office hours, we are not available 24/7.   If you have an urgent issue and are unable to reach us , you may choose to seek medical care at your doctor's office, retail clinic, urgent care center, or emergency room.  If you have a medical emergency, please immediately call 911 or go to the emergency department.  Pager Numbers  - Dr. Hester: [phone]  - Dr. Jackquline: [phone]  - Dr. Claudene: [phone]   - Dr. Raymund: [phone]  In the event of inclement weather, please call our main line at [phone] for an update on the status of any delays or closures.  Dermatology Medication Tips: Please keep the boxes that topical medications come in in order to help keep track of the instructions about where and how to use these. Pharmacies typically print the medication instructions only on the boxes and not directly on the medication tubes.   If your medication is too expensive, please contact our office at [phone] option 4 or send us  a message through MyChart.   We are unable to tell what your co-pay for medications will be in advance as this is different depending on your insurance coverage. However, we may be able to find a substitute medication at lower cost or fill out paperwork to get insurance to cover a needed medication.   If a prior authorization is required to get your medication covered by your insurance company, please allow us  1-2 business days to complete this process.  Drug prices often vary depending on where the prescription is filled and some pharmacies may offer cheaper prices.  The website www.goodrx.com contains coupons for medications through different pharmacies. The  prices here do not account for what the cost may be with help from insurance (it may be cheaper with your insurance), but the website can give you the price if you did not use any insurance.  - You can print the associated coupon and take it with your prescription to the pharmacy.  - You may also stop by our office during regular business hours and pick up a GoodRx coupon card.  - If you need your prescription sent electronically to a different pharmacy, notify our office through [facility] or by phone at [phone] option 4.     Si Usted Necesita Algo Despus de Su Visita  Tambin puede enviarnos un mensaje a travs de Clinical cytogeneticist. Por lo general respondemos a los mensajes de MyChart en el transcurso de 1 a 2 das hbiles.  Para renovar recetas, por favor pida a su farmacia que se ponga en contacto con nuestra oficina. Randi lakes de fax es [place] [phone].  Si tiene un asunto urgente cuando la clnica est cerrada y que no  puede esperar hasta el siguiente da hbil, puede llamar/localizar a su doctor(a) al nmero que aparece a continuacin.   Por favor, tenga en cuenta que aunque hacemos todo lo posible para estar disponibles para asuntos urgentes fuera del horario de [place], no estamos disponibles las 24 horas del da, los 7 [address]  [address] de la [place].   Si tiene un problema urgente y no puede comunicarse con nosotros, puede optar por buscar atencin mdica  en el consultorio de su doctor(a), en una clnica privada, en un centro de atencin urgente o en una sala de emergencias.  Si tiene Engineer, drilling, por favor llame inmediatamente al 911 o vaya a la sala de emergencias.  Nmeros de bper  - Dr. Hester: [phone]  - Dra. Jackquline: [phone]  - Dr. Claudene: [phone]  - Dra. Kitts: [phone]  En caso de inclemencias del [place], por favor llame a nuestra lnea principal al [phone] para una actualizacin sobre el estado de cualquier retraso o  cierre.  Consejos para la medicacin en dermatologa: Por favor, guarde las cajas en las que vienen los medicamentos de uso tpico para ayudarle a seguir las instrucciones sobre dnde y cmo usarlos. Las farmacias generalmente imprimen las instrucciones del medicamento slo en las cajas y no directamente en los tubos del [place].   Si su medicamento es muy caro, por favor, pngase en contacto con landry rieger llamando al [phone] y presione la opcin 4 o envenos un mensaje a travs de Clinical cytogeneticist.   No podemos decirle cul ser su copago por los medicamentos por adelantado ya que esto es diferente dependiendo de la cobertura de su seguro. Sin embargo, es posible que podamos encontrar un medicamento sustituto a Audiological scientist un formulario para que el seguro cubra el medicamento que se considera necesario.   Si se requiere una autorizacin previa para que su compaa de seguros malta su medicamento, por favor permtanos de 1 a 2 das hbiles para completar este proceso.  Los precios de los medicamentos varan con frecuencia dependiendo del Environmental consultant de dnde se surte la receta y alguna farmacias pueden ofrecer precios ms baratos.  El sitio web www.goodrx.com tiene cupones para medicamentos de Health and safety inspector. Los precios aqu no tienen en cuenta lo que podra costar con la ayuda del seguro (puede ser ms barato con su seguro), pero el sitio web puede darle el precio si no utiliz Tourist information centre manager.  - Puede imprimir el cupn correspondiente y llevarlo con su receta a la farmacia.  - Tambin puede pasar por nuestra oficina durante el horario de atencin regular y Education officer, museum una tarjeta de cupones de GoodRx.  - Si necesita que su receta se enve electrnicamente a una farmacia diferente, informe a nuestra oficina a travs de MyChart de [place] o por telfono llamando al [phone] y presione la opcin 4.

## 2024-12-10 ENCOUNTER — Other Ambulatory Visit: Payer: Self-pay | Admitting: Nurse Practitioner

## 2024-12-10 DIAGNOSIS — I5030 Unspecified diastolic (congestive) heart failure: Secondary | ICD-10-CM | POA: Diagnosis not present

## 2024-12-10 DIAGNOSIS — I110 Hypertensive heart disease with heart failure: Secondary | ICD-10-CM | POA: Diagnosis not present

## 2024-12-10 DIAGNOSIS — J441 Chronic obstructive pulmonary disease with (acute) exacerbation: Secondary | ICD-10-CM | POA: Diagnosis not present

## 2024-12-10 DIAGNOSIS — I480 Paroxysmal atrial fibrillation: Secondary | ICD-10-CM | POA: Diagnosis not present

## 2024-12-10 DIAGNOSIS — I2510 Atherosclerotic heart disease of native coronary artery without angina pectoris: Secondary | ICD-10-CM | POA: Diagnosis not present

## 2024-12-10 DIAGNOSIS — I700 Atherosclerosis of aorta: Secondary | ICD-10-CM | POA: Diagnosis not present

## 2024-12-10 DIAGNOSIS — J9601 Acute respiratory failure with hypoxia: Secondary | ICD-10-CM | POA: Diagnosis not present

## 2024-12-10 DIAGNOSIS — J189 Pneumonia, unspecified organism: Secondary | ICD-10-CM | POA: Diagnosis not present

## 2024-12-10 DIAGNOSIS — I340 Nonrheumatic mitral (valve) insufficiency: Secondary | ICD-10-CM | POA: Diagnosis not present

## 2024-12-10 DIAGNOSIS — I4519 Other right bundle-branch block: Secondary | ICD-10-CM | POA: Diagnosis not present

## 2024-12-10 DIAGNOSIS — J440 Chronic obstructive pulmonary disease with acute lower respiratory infection: Secondary | ICD-10-CM | POA: Diagnosis not present

## 2024-12-10 DIAGNOSIS — J9602 Acute respiratory failure with hypercapnia: Secondary | ICD-10-CM | POA: Diagnosis not present

## 2024-12-18 ENCOUNTER — Ambulatory Visit: Admitting: Dermatology

## 2024-12-31 ENCOUNTER — Ambulatory Visit

## 2025-01-06 ENCOUNTER — Encounter

## 2025-01-06 ENCOUNTER — Ambulatory Visit

## 2025-01-07 ENCOUNTER — Ambulatory Visit

## 2025-01-07 DIAGNOSIS — E538 Deficiency of other specified B group vitamins: Secondary | ICD-10-CM

## 2025-01-07 MED ORDER — CYANOCOBALAMIN 1000 MCG/ML IJ SOLN
1000.0000 ug | Freq: Once | INTRAMUSCULAR | Status: AC
  Administered 2025-01-07: 1000 ug via INTRAMUSCULAR

## 2025-01-07 NOTE — Progress Notes (Signed)
 Per orders of Dr. Crawford Givens, injection of vitamin b 12 given by Lewanda Rife in left deltoid. Patient tolerated injection well. Patient will make appointment for 1 month.

## 2025-02-13 ENCOUNTER — Ambulatory Visit

## 2025-02-17 ENCOUNTER — Ambulatory Visit

## 2025-02-17 ENCOUNTER — Encounter

## 2025-02-25 ENCOUNTER — Ambulatory Visit: Admitting: Dermatology

## 2025-03-04 ENCOUNTER — Ambulatory Visit

## 2025-08-07 ENCOUNTER — Ambulatory Visit

## 2025-08-08 ENCOUNTER — Ambulatory Visit
